# Patient Record
Sex: Female | Born: 1960 | Race: Black or African American | Hispanic: No | Marital: Married | Smoking: Former smoker
Health system: Southern US, Community
[De-identification: ages and names within clinical notes are randomized; demographics above are authoritative.]

## PROBLEM LIST (undated history)

## (undated) DIAGNOSIS — C50919 Malignant neoplasm of unspecified site of unspecified female breast: Secondary | ICD-10-CM

## (undated) DIAGNOSIS — Z8051 Family history of malignant neoplasm of kidney: Secondary | ICD-10-CM

## (undated) DIAGNOSIS — Z800 Family history of malignant neoplasm of digestive organs: Secondary | ICD-10-CM

## (undated) DIAGNOSIS — T8859XA Other complications of anesthesia, initial encounter: Secondary | ICD-10-CM

## (undated) DIAGNOSIS — E785 Hyperlipidemia, unspecified: Secondary | ICD-10-CM

## (undated) DIAGNOSIS — I10 Essential (primary) hypertension: Secondary | ICD-10-CM

## (undated) DIAGNOSIS — Z22322 Carrier or suspected carrier of Methicillin resistant Staphylococcus aureus: Secondary | ICD-10-CM

## (undated) DIAGNOSIS — E119 Type 2 diabetes mellitus without complications: Secondary | ICD-10-CM

## (undated) DIAGNOSIS — F32A Depression, unspecified: Secondary | ICD-10-CM

## (undated) DIAGNOSIS — R7303 Prediabetes: Secondary | ICD-10-CM

## (undated) DIAGNOSIS — Z803 Family history of malignant neoplasm of breast: Secondary | ICD-10-CM

## (undated) DIAGNOSIS — L03116 Cellulitis of left lower limb: Secondary | ICD-10-CM

## (undated) DIAGNOSIS — E039 Hypothyroidism, unspecified: Secondary | ICD-10-CM

## (undated) DIAGNOSIS — Z8042 Family history of malignant neoplasm of prostate: Secondary | ICD-10-CM

## (undated) HISTORY — DX: Family history of malignant neoplasm of digestive organs: Z80.0

## (undated) HISTORY — PX: BREAST SURGERY: SHX581

## (undated) HISTORY — DX: Malignant neoplasm of unspecified site of unspecified female breast: C50.919

## (undated) HISTORY — DX: Hyperlipidemia, unspecified: E78.5

## (undated) HISTORY — DX: Hypothyroidism, unspecified: E03.9

## (undated) HISTORY — DX: Cellulitis of left lower limb: L03.116

## (undated) HISTORY — DX: Family history of malignant neoplasm of kidney: Z80.51

## (undated) HISTORY — DX: Family history of malignant neoplasm of prostate: Z80.42

## (undated) HISTORY — DX: Family history of malignant neoplasm of breast: Z80.3

## (undated) HISTORY — PX: OTHER SURGICAL HISTORY: SHX169

## (undated) HISTORY — DX: Carrier or suspected carrier of methicillin resistant Staphylococcus aureus: Z22.322

---

## 2001-11-03 ENCOUNTER — Other Ambulatory Visit: Admission: RE | Admit: 2001-11-03 | Discharge: 2001-11-03 | Payer: Self-pay | Admitting: Family Medicine

## 2002-05-17 ENCOUNTER — Ambulatory Visit (HOSPITAL_COMMUNITY): Admission: RE | Admit: 2002-05-17 | Discharge: 2002-05-17 | Payer: Self-pay | Admitting: Family Medicine

## 2002-05-17 ENCOUNTER — Encounter: Payer: Self-pay | Admitting: Family Medicine

## 2002-05-27 ENCOUNTER — Ambulatory Visit (HOSPITAL_COMMUNITY): Admission: RE | Admit: 2002-05-27 | Discharge: 2002-05-27 | Payer: Self-pay | Admitting: Family Medicine

## 2002-05-27 ENCOUNTER — Encounter: Payer: Self-pay | Admitting: Family Medicine

## 2002-05-31 ENCOUNTER — Encounter: Admission: RE | Admit: 2002-05-31 | Discharge: 2002-05-31 | Payer: Self-pay | Admitting: Family Medicine

## 2002-05-31 ENCOUNTER — Encounter (INDEPENDENT_AMBULATORY_CARE_PROVIDER_SITE_OTHER): Payer: Self-pay | Admitting: *Deleted

## 2002-05-31 ENCOUNTER — Encounter: Payer: Self-pay | Admitting: Family Medicine

## 2002-06-11 ENCOUNTER — Inpatient Hospital Stay (HOSPITAL_COMMUNITY): Admission: RE | Admit: 2002-06-11 | Discharge: 2002-06-14 | Payer: Self-pay | Admitting: General Surgery

## 2002-06-30 ENCOUNTER — Encounter: Admission: RE | Admit: 2002-06-30 | Discharge: 2002-06-30 | Payer: Self-pay | Admitting: Oncology

## 2002-11-04 ENCOUNTER — Encounter: Payer: Self-pay | Admitting: Family Medicine

## 2002-11-04 ENCOUNTER — Ambulatory Visit (HOSPITAL_COMMUNITY): Admission: RE | Admit: 2002-11-04 | Discharge: 2002-11-04 | Payer: Self-pay | Admitting: Family Medicine

## 2003-06-24 ENCOUNTER — Encounter: Payer: Self-pay | Admitting: Family Medicine

## 2003-06-24 ENCOUNTER — Ambulatory Visit (HOSPITAL_COMMUNITY): Admission: RE | Admit: 2003-06-24 | Discharge: 2003-06-24 | Payer: Self-pay | Admitting: Family Medicine

## 2003-07-04 ENCOUNTER — Encounter (HOSPITAL_COMMUNITY): Admission: RE | Admit: 2003-07-04 | Discharge: 2003-07-07 | Payer: Self-pay | Admitting: Oncology

## 2003-07-04 ENCOUNTER — Encounter: Admission: RE | Admit: 2003-07-04 | Discharge: 2003-07-04 | Payer: Self-pay | Admitting: Oncology

## 2004-06-26 ENCOUNTER — Ambulatory Visit (HOSPITAL_COMMUNITY): Admission: RE | Admit: 2004-06-26 | Discharge: 2004-06-26 | Payer: Self-pay | Admitting: Family Medicine

## 2004-07-04 ENCOUNTER — Encounter (HOSPITAL_COMMUNITY): Admission: RE | Admit: 2004-07-04 | Discharge: 2004-08-03 | Payer: Self-pay | Admitting: Oncology

## 2004-07-04 ENCOUNTER — Encounter: Admission: RE | Admit: 2004-07-04 | Discharge: 2004-07-04 | Payer: Self-pay | Admitting: Oncology

## 2004-11-15 ENCOUNTER — Ambulatory Visit: Payer: Self-pay | Admitting: Family Medicine

## 2005-02-08 ENCOUNTER — Ambulatory Visit: Payer: Self-pay | Admitting: Family Medicine

## 2005-07-03 ENCOUNTER — Ambulatory Visit (HOSPITAL_COMMUNITY): Admission: RE | Admit: 2005-07-03 | Discharge: 2005-07-03 | Payer: Self-pay | Admitting: Family Medicine

## 2005-07-03 ENCOUNTER — Ambulatory Visit (HOSPITAL_COMMUNITY): Payer: Self-pay | Admitting: Oncology

## 2005-07-03 ENCOUNTER — Encounter (HOSPITAL_COMMUNITY): Admission: RE | Admit: 2005-07-03 | Discharge: 2005-07-06 | Payer: Self-pay | Admitting: Oncology

## 2005-07-03 ENCOUNTER — Encounter: Admission: RE | Admit: 2005-07-03 | Discharge: 2005-07-06 | Payer: Self-pay | Admitting: Oncology

## 2005-11-18 ENCOUNTER — Ambulatory Visit: Payer: Self-pay | Admitting: Family Medicine

## 2005-12-05 ENCOUNTER — Ambulatory Visit: Payer: Self-pay | Admitting: Family Medicine

## 2005-12-14 ENCOUNTER — Encounter (INDEPENDENT_AMBULATORY_CARE_PROVIDER_SITE_OTHER): Payer: Self-pay | Admitting: *Deleted

## 2006-01-07 ENCOUNTER — Ambulatory Visit: Payer: Self-pay | Admitting: Family Medicine

## 2006-05-13 ENCOUNTER — Ambulatory Visit: Payer: Self-pay | Admitting: Family Medicine

## 2006-06-17 ENCOUNTER — Ambulatory Visit: Payer: Self-pay | Admitting: Family Medicine

## 2006-07-02 ENCOUNTER — Encounter (HOSPITAL_COMMUNITY): Admission: RE | Admit: 2006-07-02 | Discharge: 2006-07-05 | Payer: Self-pay | Admitting: Oncology

## 2006-07-02 ENCOUNTER — Ambulatory Visit (HOSPITAL_COMMUNITY): Payer: Self-pay | Admitting: Oncology

## 2006-07-02 ENCOUNTER — Encounter: Admission: RE | Admit: 2006-07-02 | Discharge: 2006-07-05 | Payer: Self-pay | Admitting: Oncology

## 2006-07-07 ENCOUNTER — Encounter: Admission: RE | Admit: 2006-07-07 | Discharge: 2006-07-07 | Payer: Self-pay | Admitting: Oncology

## 2006-07-07 ENCOUNTER — Encounter (HOSPITAL_COMMUNITY): Admission: RE | Admit: 2006-07-07 | Discharge: 2006-08-06 | Payer: Self-pay | Admitting: Oncology

## 2006-10-14 ENCOUNTER — Ambulatory Visit: Payer: Self-pay | Admitting: Family Medicine

## 2006-10-15 ENCOUNTER — Encounter: Payer: Self-pay | Admitting: Family Medicine

## 2006-12-15 ENCOUNTER — Encounter: Payer: Self-pay | Admitting: Family Medicine

## 2006-12-15 LAB — CONVERTED CEMR LAB
ALT: <8 units/L (ref 0–35)
AST: 11 units/L (ref 0–37)
BUN: 14 mg/dL (ref 6–23)
Bilirubin, Direct: <0.1 mg/dL (ref 0.0–0.3)
CO2: 22 meq/L (ref 19–32)
Calcium: 9 mg/dL (ref 8.4–10.5)
Cholesterol: 220 mg/dL — ABNORMAL HIGH (ref 0–200)
Creatinine, Ser: 0.77 mg/dL (ref 0.40–1.20)
Eosinophils Absolute: 0 10*3/uL (ref 0.0–0.7)
Eosinophils Relative: 1 % (ref 0–5)
Glucose, Bld: 102 mg/dL — ABNORMAL HIGH (ref 70–99)
HCT: 35.5 % — ABNORMAL LOW (ref 36.0–46.0)
Hemoglobin: 11.6 g/dL — ABNORMAL LOW (ref 12.0–15.0)
Lymphs Abs: 2 10*3/uL (ref 0.7–3.3)
MCV: 93.2 fL (ref 78.0–100.0)
Monocytes Absolute: 0.3 10*3/uL (ref 0.2–0.7)
Monocytes Relative: 6 % (ref 3–11)
Platelets: 302 10*3/uL (ref 150–400)
RBC: 3.81 M/uL — ABNORMAL LOW (ref 3.87–5.11)
Total Bilirubin: 0.3 mg/dL (ref 0.3–1.2)
WBC: 4.6 10*3/uL (ref 4.0–10.5)

## 2007-01-14 ENCOUNTER — Encounter (INDEPENDENT_AMBULATORY_CARE_PROVIDER_SITE_OTHER): Payer: Self-pay | Admitting: *Deleted

## 2007-01-14 ENCOUNTER — Encounter: Payer: Self-pay | Admitting: Family Medicine

## 2007-01-14 ENCOUNTER — Ambulatory Visit: Payer: Self-pay | Admitting: Family Medicine

## 2007-01-14 ENCOUNTER — Other Ambulatory Visit: Admission: RE | Admit: 2007-01-14 | Discharge: 2007-01-14 | Payer: Self-pay | Admitting: Family Medicine

## 2007-01-15 ENCOUNTER — Encounter: Payer: Self-pay | Admitting: Family Medicine

## 2007-01-15 LAB — CONVERTED CEMR LAB
Candida species: NEGATIVE
Chlamydia, DNA Probe: NEGATIVE
GC Probe Amp, Genital: NEGATIVE
Gardnerella vaginalis: POSITIVE — AB

## 2007-05-18 ENCOUNTER — Encounter: Payer: Self-pay | Admitting: Family Medicine

## 2007-05-18 LAB — CONVERTED CEMR LAB
ALT: 8 units/L (ref 0–35)
Albumin: 4.3 g/dL (ref 3.5–5.2)
Alkaline Phosphatase: 55 units/L (ref 39–117)
BUN: 19 mg/dL (ref 6–23)
Chloride: 106 meq/L (ref 96–112)
Cholesterol: 200 mg/dL (ref 0–200)
Creatinine, Ser: 0.8 mg/dL (ref 0.40–1.20)
HDL: 59 mg/dL (ref >39)
Indirect Bilirubin: 0.3 mg/dL (ref 0.0–0.9)
LDL Cholesterol: 132 mg/dL — ABNORMAL HIGH (ref 0–99)
Potassium: 4.5 meq/L (ref 3.5–5.3)
Total Protein: 7.4 g/dL (ref 6.0–8.3)
Triglycerides: 43 mg/dL (ref <150)

## 2007-05-25 ENCOUNTER — Ambulatory Visit: Payer: Self-pay | Admitting: Family Medicine

## 2007-07-01 ENCOUNTER — Ambulatory Visit (HOSPITAL_COMMUNITY): Payer: Self-pay | Admitting: Oncology

## 2007-07-08 ENCOUNTER — Ambulatory Visit: Payer: Self-pay | Admitting: Oncology

## 2007-07-13 ENCOUNTER — Ambulatory Visit (HOSPITAL_COMMUNITY): Admission: RE | Admit: 2007-07-13 | Discharge: 2007-07-13 | Payer: Self-pay | Admitting: Family Medicine

## 2007-09-15 ENCOUNTER — Emergency Department (HOSPITAL_COMMUNITY): Admission: EM | Admit: 2007-09-15 | Discharge: 2007-09-15 | Payer: Self-pay | Admitting: Emergency Medicine

## 2007-09-17 ENCOUNTER — Ambulatory Visit (HOSPITAL_COMMUNITY): Admission: RE | Admit: 2007-09-17 | Discharge: 2007-09-17 | Payer: Self-pay | Admitting: Family Medicine

## 2007-09-17 ENCOUNTER — Ambulatory Visit: Payer: Self-pay | Admitting: Family Medicine

## 2007-09-18 ENCOUNTER — Encounter: Payer: Self-pay | Admitting: Family Medicine

## 2007-09-18 LAB — CONVERTED CEMR LAB
Alkaline Phosphatase: 64 units/L (ref 39–117)
Bilirubin, Direct: 0.1 mg/dL (ref 0.0–0.3)
Indirect Bilirubin: 0.3 mg/dL (ref 0.0–0.9)
LDL Cholesterol: 134 mg/dL — ABNORMAL HIGH (ref 0–99)
Total Bilirubin: 0.4 mg/dL (ref 0.3–1.2)
Triglycerides: 67 mg/dL (ref <150)
VLDL: 13 mg/dL (ref 0–40)

## 2007-10-05 ENCOUNTER — Ambulatory Visit (HOSPITAL_COMMUNITY): Admission: RE | Admit: 2007-10-05 | Discharge: 2007-10-05 | Payer: Self-pay | Admitting: Family Medicine

## 2007-11-25 ENCOUNTER — Ambulatory Visit: Payer: Self-pay | Admitting: Family Medicine

## 2008-01-11 ENCOUNTER — Encounter: Payer: Self-pay | Admitting: Family Medicine

## 2008-01-11 LAB — CONVERTED CEMR LAB
BUN: 17 mg/dL (ref 6–23)
Basophils Relative: 0 % (ref 0–1)
Cholesterol: 211 mg/dL — ABNORMAL HIGH (ref 0–200)
Creatinine, Ser: 0.72 mg/dL (ref 0.40–1.20)
Eosinophils Absolute: 0.1 10*3/uL (ref 0.0–0.7)
Eosinophils Relative: 1 % (ref 0–5)
Glucose, Bld: 96 mg/dL (ref 70–99)
HCT: 35.5 % — ABNORMAL LOW (ref 36.0–46.0)
Hemoglobin: 11.7 g/dL — ABNORMAL LOW (ref 12.0–15.0)
LDL Cholesterol: 147 mg/dL — ABNORMAL HIGH (ref 0–99)
Lymphs Abs: 2.4 10*3/uL (ref 0.7–4.0)
MCHC: 33 g/dL (ref 30.0–36.0)
MCV: 92.7 fL (ref 78.0–100.0)
Monocytes Absolute: 0.4 10*3/uL (ref 0.1–1.0)
Monocytes Relative: 8 % (ref 3–12)
Neutrophils Relative %: 45 % (ref 43–77)
Potassium: 4.4 meq/L (ref 3.5–5.3)
RBC: 3.83 M/uL — ABNORMAL LOW (ref 3.87–5.11)
Triglycerides: 50 mg/dL (ref <150)
VLDL: 10 mg/dL (ref 0–40)
WBC: 5.2 10*3/uL (ref 4.0–10.5)

## 2008-01-28 ENCOUNTER — Encounter: Payer: Self-pay | Admitting: Family Medicine

## 2008-01-28 ENCOUNTER — Encounter (INDEPENDENT_AMBULATORY_CARE_PROVIDER_SITE_OTHER): Payer: Self-pay | Admitting: *Deleted

## 2008-01-28 ENCOUNTER — Ambulatory Visit: Payer: Self-pay | Admitting: Family Medicine

## 2008-01-28 ENCOUNTER — Other Ambulatory Visit: Admission: RE | Admit: 2008-01-28 | Discharge: 2008-01-28 | Payer: Self-pay | Admitting: Family Medicine

## 2008-01-29 ENCOUNTER — Encounter: Payer: Self-pay | Admitting: Family Medicine

## 2008-01-29 LAB — CONVERTED CEMR LAB
Candida species: NEGATIVE
Chlamydia, DNA Probe: NEGATIVE
GC Probe Amp, Genital: NEGATIVE
Gardnerella vaginalis: POSITIVE — AB

## 2008-02-04 ENCOUNTER — Encounter (INDEPENDENT_AMBULATORY_CARE_PROVIDER_SITE_OTHER): Payer: Self-pay | Admitting: *Deleted

## 2008-02-04 DIAGNOSIS — C50919 Malignant neoplasm of unspecified site of unspecified female breast: Secondary | ICD-10-CM | POA: Insufficient documentation

## 2008-02-04 DIAGNOSIS — E782 Mixed hyperlipidemia: Secondary | ICD-10-CM | POA: Insufficient documentation

## 2008-02-04 DIAGNOSIS — E039 Hypothyroidism, unspecified: Secondary | ICD-10-CM | POA: Insufficient documentation

## 2008-02-04 DIAGNOSIS — E785 Hyperlipidemia, unspecified: Secondary | ICD-10-CM | POA: Insufficient documentation

## 2008-06-30 ENCOUNTER — Encounter: Payer: Self-pay | Admitting: Family Medicine

## 2008-07-04 ENCOUNTER — Ambulatory Visit (HOSPITAL_COMMUNITY): Payer: Self-pay | Admitting: Oncology

## 2008-07-04 ENCOUNTER — Encounter: Payer: Self-pay | Admitting: Family Medicine

## 2008-07-04 ENCOUNTER — Telehealth: Payer: Self-pay | Admitting: Family Medicine

## 2008-07-08 ENCOUNTER — Encounter: Payer: Self-pay | Admitting: Family Medicine

## 2008-07-11 LAB — CONVERTED CEMR LAB
ALT: 22 units/L (ref 0–35)
Cholesterol: 139 mg/dL (ref 0–200)
LDL Cholesterol: 69 mg/dL (ref 0–99)
TSH: 0.18 microintl units/mL — ABNORMAL LOW (ref 0.350–4.50)
Total CHOL/HDL Ratio: 2.4
Total Protein: 7.8 g/dL (ref 6.0–8.3)
Triglycerides: 62 mg/dL (ref <150)
VLDL: 12 mg/dL (ref 0–40)

## 2008-07-13 ENCOUNTER — Ambulatory Visit (HOSPITAL_COMMUNITY): Admission: RE | Admit: 2008-07-13 | Discharge: 2008-07-13 | Payer: Self-pay | Admitting: Family Medicine

## 2008-07-18 ENCOUNTER — Ambulatory Visit: Payer: Self-pay | Admitting: Family Medicine

## 2008-07-20 DIAGNOSIS — E669 Obesity, unspecified: Secondary | ICD-10-CM | POA: Insufficient documentation

## 2008-08-02 ENCOUNTER — Encounter: Payer: Self-pay | Admitting: Family Medicine

## 2008-08-18 ENCOUNTER — Ambulatory Visit: Payer: Self-pay | Admitting: Family Medicine

## 2008-08-20 ENCOUNTER — Encounter: Payer: Self-pay | Admitting: Family Medicine

## 2008-08-24 LAB — CONVERTED CEMR LAB
Chlamydia, DNA Probe: NEGATIVE
GC Probe Amp, Genital: NEGATIVE

## 2008-09-06 ENCOUNTER — Encounter: Payer: Self-pay | Admitting: Family Medicine

## 2008-09-07 ENCOUNTER — Encounter: Payer: Self-pay | Admitting: Family Medicine

## 2008-10-24 ENCOUNTER — Ambulatory Visit: Payer: Self-pay | Admitting: Family Medicine

## 2008-10-25 ENCOUNTER — Encounter: Payer: Self-pay | Admitting: Family Medicine

## 2008-10-25 LAB — CONVERTED CEMR LAB
Candida species: NEGATIVE
GC Probe Amp, Genital: NEGATIVE
Gardnerella vaginalis: POSITIVE — AB
Trichomonal Vaginitis: NEGATIVE

## 2008-12-22 ENCOUNTER — Telehealth: Payer: Self-pay | Admitting: Family Medicine

## 2008-12-22 ENCOUNTER — Ambulatory Visit: Payer: Self-pay | Admitting: Family Medicine

## 2008-12-22 DIAGNOSIS — R5383 Other fatigue: Secondary | ICD-10-CM

## 2008-12-22 DIAGNOSIS — R5381 Other malaise: Secondary | ICD-10-CM

## 2008-12-22 LAB — CONVERTED CEMR LAB: Hemoglobin: 10.8 g/dL

## 2009-01-04 ENCOUNTER — Telehealth: Payer: Self-pay | Admitting: Family Medicine

## 2009-01-11 ENCOUNTER — Telehealth: Payer: Self-pay | Admitting: Family Medicine

## 2009-01-13 ENCOUNTER — Ambulatory Visit (HOSPITAL_COMMUNITY): Admission: RE | Admit: 2009-01-13 | Discharge: 2009-01-13 | Payer: Self-pay | Admitting: Family Medicine

## 2009-01-13 ENCOUNTER — Encounter: Payer: Self-pay | Admitting: Family Medicine

## 2009-01-27 ENCOUNTER — Other Ambulatory Visit: Admission: RE | Admit: 2009-01-27 | Discharge: 2009-01-27 | Payer: Self-pay | Admitting: Obstetrics and Gynecology

## 2009-01-30 ENCOUNTER — Encounter: Payer: Self-pay | Admitting: Family Medicine

## 2009-02-17 ENCOUNTER — Encounter: Payer: Self-pay | Admitting: Family Medicine

## 2009-02-17 DIAGNOSIS — D509 Iron deficiency anemia, unspecified: Secondary | ICD-10-CM | POA: Insufficient documentation

## 2009-02-17 DIAGNOSIS — D649 Anemia, unspecified: Secondary | ICD-10-CM | POA: Insufficient documentation

## 2009-03-20 ENCOUNTER — Encounter: Payer: Self-pay | Admitting: Family Medicine

## 2009-03-22 LAB — CONVERTED CEMR LAB
Albumin: 3.9 g/dL (ref 3.5–5.2)
Alkaline Phosphatase: 57 units/L (ref 39–117)
Bilirubin, Direct: 0.1 mg/dL (ref 0.0–0.3)
Indirect Bilirubin: 0.3 mg/dL (ref 0.0–0.9)
LDL Cholesterol: 62 mg/dL (ref 0–99)
Total Bilirubin: 0.4 mg/dL (ref 0.3–1.2)

## 2009-06-14 ENCOUNTER — Encounter (INDEPENDENT_AMBULATORY_CARE_PROVIDER_SITE_OTHER): Payer: Self-pay

## 2009-06-14 ENCOUNTER — Ambulatory Visit: Payer: Self-pay | Admitting: Family Medicine

## 2009-06-14 LAB — CONVERTED CEMR LAB
BUN: 9 mg/dL (ref 6–23)
CO2: 29 meq/L (ref 19–32)
Chloride: 103 meq/L (ref 96–112)
Glucose, Bld: 104 mg/dL — ABNORMAL HIGH (ref 70–99)
Potassium: 3.9 meq/L (ref 3.5–5.3)

## 2009-07-17 ENCOUNTER — Ambulatory Visit (HOSPITAL_COMMUNITY): Admission: RE | Admit: 2009-07-17 | Discharge: 2009-07-17 | Payer: Self-pay | Admitting: Family Medicine

## 2009-07-28 ENCOUNTER — Ambulatory Visit (HOSPITAL_COMMUNITY): Payer: Self-pay | Admitting: Oncology

## 2009-08-01 ENCOUNTER — Encounter: Payer: Self-pay | Admitting: Family Medicine

## 2009-10-10 ENCOUNTER — Ambulatory Visit: Payer: Self-pay | Admitting: Family Medicine

## 2009-10-12 LAB — CONVERTED CEMR LAB: Retic Ct Pct: 1.2 % (ref 0.4–3.1)

## 2009-10-31 ENCOUNTER — Telehealth (INDEPENDENT_AMBULATORY_CARE_PROVIDER_SITE_OTHER): Payer: Self-pay

## 2009-10-31 ENCOUNTER — Encounter: Payer: Self-pay | Admitting: Gastroenterology

## 2009-11-06 ENCOUNTER — Ambulatory Visit: Payer: Self-pay | Admitting: Gastroenterology

## 2009-11-06 ENCOUNTER — Ambulatory Visit (HOSPITAL_COMMUNITY): Admission: RE | Admit: 2009-11-06 | Discharge: 2009-11-06 | Payer: Self-pay | Admitting: Gastroenterology

## 2009-11-08 ENCOUNTER — Encounter (INDEPENDENT_AMBULATORY_CARE_PROVIDER_SITE_OTHER): Payer: Self-pay

## 2010-01-04 ENCOUNTER — Ambulatory Visit: Payer: Self-pay | Admitting: Family Medicine

## 2010-02-20 ENCOUNTER — Encounter: Payer: Self-pay | Admitting: Family Medicine

## 2010-02-28 ENCOUNTER — Encounter: Payer: Self-pay | Admitting: Family Medicine

## 2010-02-28 LAB — CONVERTED CEMR LAB: Retic Ct Pct: 1.3 % (ref 0.4–3.1)

## 2010-03-01 ENCOUNTER — Other Ambulatory Visit: Admission: RE | Admit: 2010-03-01 | Discharge: 2010-03-01 | Payer: Self-pay | Admitting: Family Medicine

## 2010-03-01 ENCOUNTER — Ambulatory Visit: Payer: Self-pay | Admitting: Family Medicine

## 2010-03-02 ENCOUNTER — Encounter: Payer: Self-pay | Admitting: Family Medicine

## 2010-03-06 LAB — CONVERTED CEMR LAB: Vit D, 25-Hydroxy: 23 ng/mL — ABNORMAL LOW (ref 30–89)

## 2010-03-12 ENCOUNTER — Encounter: Payer: Self-pay | Admitting: Family Medicine

## 2010-03-12 LAB — CONVERTED CEMR LAB: Pap Smear: NEGATIVE

## 2010-03-22 ENCOUNTER — Ambulatory Visit: Payer: Self-pay | Admitting: Family Medicine

## 2010-03-23 ENCOUNTER — Encounter: Payer: Self-pay | Admitting: Physician Assistant

## 2010-03-23 LAB — CONVERTED CEMR LAB: GC Probe Amp, Genital: NEGATIVE

## 2010-03-24 ENCOUNTER — Encounter: Payer: Self-pay | Admitting: Physician Assistant

## 2010-03-30 ENCOUNTER — Telehealth: Payer: Self-pay | Admitting: Physician Assistant

## 2010-04-25 ENCOUNTER — Encounter (INDEPENDENT_AMBULATORY_CARE_PROVIDER_SITE_OTHER): Payer: Self-pay

## 2010-04-25 ENCOUNTER — Ambulatory Visit: Payer: Self-pay | Admitting: Family Medicine

## 2010-04-25 ENCOUNTER — Ambulatory Visit (HOSPITAL_COMMUNITY): Admission: RE | Admit: 2010-04-25 | Discharge: 2010-04-25 | Payer: Self-pay | Admitting: Family Medicine

## 2010-04-25 LAB — CONVERTED CEMR LAB
Basophils Absolute: 0 10*3/uL (ref 0.0–0.1)
Eosinophils Relative: 1 % (ref 0–5)
HCT: 38.6 % (ref 36.0–46.0)
Hemoglobin: 11.9 g/dL — ABNORMAL LOW (ref 12.0–15.0)
Lymphocytes Relative: 23 % (ref 12–46)
MCHC: 30.8 g/dL (ref 30.0–36.0)
MCV: 95.3 fL (ref 78.0–100.0)
Monocytes Absolute: 0.8 10*3/uL (ref 0.1–1.0)
RDW: 14.4 % (ref 11.5–15.5)

## 2010-07-23 ENCOUNTER — Ambulatory Visit (HOSPITAL_COMMUNITY): Admission: RE | Admit: 2010-07-23 | Discharge: 2010-07-23 | Payer: Self-pay | Admitting: Family Medicine

## 2010-07-26 ENCOUNTER — Ambulatory Visit: Payer: Self-pay | Admitting: Family Medicine

## 2010-07-31 ENCOUNTER — Encounter: Payer: Self-pay | Admitting: Family Medicine

## 2010-08-03 LAB — CONVERTED CEMR LAB
AST: 14 units/L (ref 0–37)
Bilirubin, Direct: 0.1 mg/dL (ref 0.0–0.3)
TSH: 0.349 microintl units/mL — ABNORMAL LOW (ref 0.350–4.500)
Total Bilirubin: 0.3 mg/dL (ref 0.3–1.2)
Total CHOL/HDL Ratio: 4.3

## 2010-08-04 DIAGNOSIS — E559 Vitamin D deficiency, unspecified: Secondary | ICD-10-CM | POA: Insufficient documentation

## 2010-08-09 ENCOUNTER — Emergency Department (HOSPITAL_COMMUNITY): Admission: EM | Admit: 2010-08-09 | Discharge: 2010-08-09 | Payer: Self-pay | Admitting: Emergency Medicine

## 2010-08-09 ENCOUNTER — Ambulatory Visit: Payer: Self-pay | Admitting: Family Medicine

## 2010-08-09 DIAGNOSIS — R079 Chest pain, unspecified: Secondary | ICD-10-CM | POA: Insufficient documentation

## 2010-08-17 ENCOUNTER — Encounter: Payer: Self-pay | Admitting: Family Medicine

## 2010-08-17 ENCOUNTER — Ambulatory Visit (HOSPITAL_COMMUNITY): Payer: Self-pay | Admitting: Oncology

## 2010-08-17 ENCOUNTER — Encounter (HOSPITAL_COMMUNITY)
Admission: RE | Admit: 2010-08-17 | Discharge: 2010-09-16 | Payer: Self-pay | Source: Home / Self Care | Attending: Oncology | Admitting: Oncology

## 2010-08-21 ENCOUNTER — Encounter: Payer: Self-pay | Admitting: Family Medicine

## 2010-11-08 NOTE — Letter (Signed)
Summary: Internal Other Domingo Dimes  Internal Other Domingo Dimes   Imported By: Cloria Spring LPN 16/07/9603 54:09:81  _____________________________________________________________________  External Attachment:    Type:   Image     Comment:   External Document

## 2010-11-08 NOTE — Letter (Signed)
Summary: Letter  Letter   Imported By: Lind Guest 03/12/2010 13:00:45  _____________________________________________________________________  External Attachment:    Type:   Image     Comment:   External Document

## 2010-11-08 NOTE — Letter (Signed)
Summary: Patient Notice, Colon Biopsy Results  [facility]  [address]   [place], [place] [address]   Phone: [phone]  Fax: [phone]       November 08, 2009   Deanna Bush [address] [place], [place]  [address] 03/25/1961    Dear Ms. Jennette Kettle,  I am pleased to inform you that the biopsies taken during your recent colonoscopy did not show any evidence of cancer upon pathologic examination.  Additional information/recommendations:  __Please follow a high fiber diet  __You should have a repeat colonoscopy examination  in 10 years.  Please call us if you are having persistent problems or have questions about your condition that have not been fully answered at this time.  Sincerely,    Hendricks Limes LPN  [facility] Ph: [phone]    Fax: [phone]

## 2010-11-08 NOTE — Letter (Signed)
Summary: [facility]  [facility]   Imported By: Lind Guest 08/24/2010 09:58:04  _____________________________________________________________________  External Attachment:    Type:   Image     Comment:   External Document

## 2010-11-08 NOTE — Assessment & Plan Note (Signed)
Summary: discharge- room 2   Vital Signs:  Patient profile:   50 year old female Menstrual status:  irregular Height:      66 inches Weight:      203 pounds BMI:     32.88 O2 Sat:      98 % on Room air Pulse rate:   71 / minute Resp:     16 per minute BP sitting:   124 / 60  (left arm)  Vitals Entered By: Adella Hare LPN (March 22, 2010 3:21 PM) CC: vaginal discharge Is Patient Diabetic? No Pain Assessment Patient in pain? no      Comments did not bring meds to ov   CC:  vaginal discharge.  History of Present Illness: Pt presents today with c/o vag dischg x 2 days.  Is becoming irritated.  No itch.  + odor.  No new partners.  Vag bleeding is irrg.  Is perimenopausal.  Had nl pap & pelvic exam end of May.  No pelvic pain. No dysuria or freq.  No change BMs.   Allergies (verified): 1)  ! Sulfa  Past History:  Past medical history reviewed for relevance to current acute and chronic problems.  Past Medical History: Reviewed history from 02/04/2008 and no changes required. 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 GI:  Denies abdominal pain and change in bowel habits. GU:  Denies dysuria and urinary frequency.  Physical Exam  General:  Well-developed,well-nourished,in no acute distress; alert,appropriate and cooperative throughout examination Head:  Normocephalic and atraumatic without obvious abnormalities. No apparent alopecia or balding. Genitalia:  normal introitus, no external lesions, and vaginal discharge yellowish, frothy.  normal introitus, no external lesions, no vaginal or cervical lesions, no friaility or hemorrhage, normal uterus size and position, no adnexal masses or tenderness, and vaginal discharge.   Skin:  Intact without suspicious lesions or rashes genitalia , groin, and superior medial  thighs. Psych:  Cognition and judgment appear intact. Alert and cooperative with normal attention span and concentration. No apparent delusions, illusions, hallucinations   Impression & Recommendations:  Problem # 1:  VAGINITIS (ICD-616.10) Assessment New  Her updated medication list for this problem includes:    Metronidazole 500 Mg Tabs (Metronidazole) .Marland Kitchen... Take 1 two times a day x 7 days  Orders: T-Wet Prep by Molecular Probe [phone]) T-Chlamydia & GC Probe, Genital ([id number])  Complete Medication List: 1)  Lipitor 80 Mg Tabs (Atorvastatin calcium) .... One tab by mouth at bedtime 2)  Ibu 800 Mg Tabs (Ibuprofen) .... Take 1 tablet by mouth three times a day as needed 3)  Mastectomy Bra  .... X 6 4)  Synthroid 125 Mcg Tabs (Levothyroxine sodium) .... Take 1 tablet by mouth once a day 5)  Zyrtec Hives Relief 10 Mg Tabs (Cetirizine hcl) .... Take 1 tablet by mouth once a day 6)  Vitamin D (ergocalciferol) 50000 Unit Caps (Ergocalciferol) .... One tab once a week 7)  Metronidazole 500 Mg Tabs (Metronidazole) .... Take 1 two times a day x 7 days  Patient Instructions: 1)  Please schedule a follow-up appointment as needed. 2)  I have prescribed an antibiotic for you infection. 3)  I will notify you of your culture results when they are available. Prescriptions: METRONIDAZOLE 500 MG TABS (METRONIDAZOLE) take 1 two times a day x 7 days  #14 x 0   Entered and Authorized by:   Esperanza Sheets PA  Signed by:   Esperanza Sheets PA on 03/22/2010   Method used:   Electronically to        [facility] (retail)       [address]       [place], [place]  [address]       Ph: [phone]       Fax: [phone]   RxID:   [id number]

## 2010-11-08 NOTE — Miscellaneous (Signed)
Clinical Lists Changes  Problems: Assessed SCREENING FOR MALIGNANT NEOPLASM OF THE CERVIX as comment only - Signed Assessed HYPERLIPIDEMIA as improved -  Her updated medication list for this problem includes:    Lipitor 80 Mg Tabs (Atorvastatin calcium) ..... One tab by mouth at bedtimetake half lipitor80 mg 5 days per week  Labs Reviewed: SGOT: 13 (02/28/2010)   SGPT: 11 (02/28/2010)   HDL:62 (02/28/2010), 43 (10/10/2009)  LDL:71 (02/28/2010), 133 (16/07/9603)  Chol:141 (02/28/2010), 194 (10/10/2009)  Trig:42 (02/28/2010), 91 (10/10/2009)  - Signed Assessed HYPOTHYROIDISM as comment only -  Her updated medication list for this problem includes:    Synthroid 125 Mcg Tabs (Levothyroxine sodium) .Marland Kitchen... Take 1 tablet by mouth once a day  Labs Reviewed: TSH: 0.678 (02/28/2010)    Chol: 141 (02/28/2010)   HDL: 62 (02/28/2010)   LDL: 71 (02/28/2010)   TG: 42 (02/28/2010)  - Signed Observations: Added new observation of INSTRUCTIONS: F/u in 4.5 months.  Fasting labs prior to next visit. It is important that you exercise regularly at least 20 minutes 5 times a week. If you develop chest pain, have severe difficulty breathing, or feel very tired , stop exercising immediately and seek medical attention. You need to lose weight. Consider a lower calorie diet and regular exercise.  (03/01/2010 19:44) Added new observation of PSYCH COMM: Cognition and judgment appear intact. Alert and cooperative with normal attention span and concentration. No apparent delusions, illusions, hallucinations (03/01/2010 19:44) Added new observation of INGUIN NODES: No significant adenopathy (03/01/2010 19:44) Added new observation of AXILLARY NOD: No palpable lymphadenopathy (03/01/2010 19:44) Added new observation of CERVIC NODES: No lymphadenopathy noted (03/01/2010 19:44) Added new observation of SKIN SQ INSP: Intact without suspicious lesions or rashes (03/01/2010 19:44) Added new observation of NEURO EXAM: No  cranial nerve deficits noted. Station and gait are normal. Plantar reflexes are down-going bilaterally. DTRs are symmetrical throughout. Sensory, motor and coordinative functions appear intact. (03/01/2010 19:44) Added new observation of EXTREMITIES: No clubbing, cyanosis, edema, or deformity noted with normal full range of motion of all joints.   (03/01/2010 19:44) Added new observation of PEDAL PULSE: R and L carotid,radial,femoral,dorsalis pedis and posterior tibial pulses are full and equal bilaterally (03/01/2010 19:44) Added new observation of MSK EXAM: No deformity or scoliosis noted of thoracic or lumbar spine.   (03/01/2010 19:44) Added new observation of GU EXAM GEN: Normal introitus for age, no external lesions, no vaginal discharge, mucosa pink and moist, no vaginal or cervical lesions, no vaginal atrophy, no friaility or hemorrhage, normal uterus size and position, no adnexal masses or tenderness (03/01/2010 19:44) Added new observation of RECTAL EXAM: No external abnormalities noted. Normal sphincter tone. No rectal masses or tenderness. (03/01/2010 19:44) Added new observation of ABDOMEN EXAM: Bowel sounds positive,abdomen soft and non-tender without masses, organomegaly or hernias noted. (03/01/2010 19:44) Added new observation of HEART EXAM: Normal rate and regular rhythm. S1 and S2 normal without gallop, murmur, click, rub or other extra sounds. (03/01/2010 19:44) Added new observation of LUNG EXAM: Normal respiratory effort, chest expands symmetrically. Lungs are clear to auscultation, no crackles or wheezes. (03/01/2010 19:44) Added new observation of BREAST INSP: No mass, nodules, thickening, tenderness, bulging, retraction, inflamation, nipple discharge or skin changes noted.   (03/01/2010 19:44) Added new observation of PE CHST WALL: No deformities, masses, or tenderness noted. (03/01/2010 19:44) Added new observation of NECK EXAM: No deformities, masses, or tenderness noted.  (03/01/2010 19:44) Added new observation of ORAL EXAM: Oral mucosa and oropharynx without lesions or  exudates.  Teeth in good repair. (03/01/2010 19:44) Added new observation of NOSE EXAM: External nasal examination shows no deformity or inflammation. Nasal mucosa are pink and moist without lesions or exudates. (03/01/2010 19:44) Added new observation of EAR EXAM: External ear exam shows no significant lesions or deformities.  Otoscopic examination reveals clear canals, tympanic membranes are intact bilaterally without bulging, retraction, inflammation or discharge. Hearing is grossly normal bilaterally. (03/01/2010 19:44) Added new observation of EYE EXAM: No corneal or conjunctival inflammation noted. EOMI. Perrla. Funduscopic exam benign, without hemorrhages, exudates or papilledema. Vision grossly normal. (03/01/2010 19:44) Added new observation of HD/FACE INSP: Normocephalic and atraumatic without obvious abnormalities. No apparent alopecia or balding. (03/01/2010 19:44) Added new observation of PEADULT: Deanna Overman MD ~General`Gen appear ~Head`hd/face insp ~Eyes`Eye exam ~Ears`Ear exam ~Nose`Nose exam ~Mouth`Oral exam ~Neck`NECK EXAM ~Chest Wall`PE Chst Wall ~Breasts`Breast Insp ~Lungs`lung exam ~Heart`Heart exam ~Abdomen`Abdomen exam ~Rectal`Rectal exam ~Genitalia`GU exam gen ~Msk`MSK EXAM ~Pulses`pedal pulse ~Extremities`Extremities ~Neurologic`Neuro exam ~Skin`skin sq insp ~Cervical Nodes`cervic nodes ~Axillary Nodes`Axillary nod ~Inguinal Nodes`inguin nodes ~Psych`psych comm (03/01/2010 19:44) Added new observation of GEN APPEAR: Well-developed,well-nourished,in no acute distress; alert,appropriate and cooperative throughout examination (03/01/2010 19:44) Added new observation of ROS ENDO: Denies cold intolerance, excessive hunger, excessive thirst, excessive urination, heat intolerance, polyuria, weight change (03/01/2010 19:44) Added new observation of ROS HEME: Denies abnormal bruising,  bleeding (03/01/2010 19:44) Added new observation of ROS ALLERG: Complains of seasonal allergies (03/01/2010 19:44) Added new observation of ROS EYES: Denies blurring, discharge (03/01/2010 19:44) Added new observation of HPI: Reports  that she has been doing well. Denies recent fever or chills. Denies sinus pressure, nasal congestion , ear pain or sore throat. Denies chest congestion, or cough productive of sputum. Denies chest pain, palpitations, PND, orthopnea or leg swelling. Denies abdominal pain, nausea, vomitting, diarrhea or constipation. Denies change in bowel movements or bloody stool. Denies dysuria , frequency, incontinence or hesitancy. Denies  joint pain, swelling, or reduced mobility. Denies headaches, vertigo, seizures. Denies depression, anxiety or insomnia. Denies  rash, lesions, or itch.    (03/01/2010 19:44)           History of Present Illness: Reports  that she has been doing well. Denies recent fever or chills. Denies sinus pressure, nasal congestion , ear pain or sore throat. Denies chest congestion, or cough productive of sputum. Denies chest pain, palpitations, PND, orthopnea or leg swelling. Denies abdominal pain, nausea, vomitting, diarrhea or constipation. Denies change in bowel movements or bloody stool. Denies dysuria , frequency, incontinence or hesitancy. Denies  joint pain, swelling, or reduced mobility. Denies headaches, vertigo, seizures. Denies depression, anxiety or insomnia. Denies  rash, lesions, or itch.      Review of Systems      See HPI Eyes:  Denies blurring and discharge. Endo:  Denies cold intolerance, excessive hunger, excessive thirst, excessive urination, heat intolerance, polyuria, and weight change. Heme:  Denies abnormal bruising and bleeding. Allergy:  Complains of seasonal allergies.   Physical Exam  General:  Well-developed,well-nourished,in no acute distress; alert,appropriate and cooperative throughout  examination Head:  Normocephalic and atraumatic without obvious abnormalities. No apparent alopecia or balding. Eyes:  No corneal or conjunctival inflammation noted. EOMI. Perrla. Funduscopic exam benign, without hemorrhages, exudates or papilledema. Vision grossly normal. Ears:  External ear exam shows no significant lesions or deformities.  Otoscopic examination reveals clear canals, tympanic membranes are intact bilaterally without bulging, retraction, inflammation or discharge. Hearing is grossly normal bilaterally. Nose:  External nasal examination shows no deformity or inflammation. Nasal  mucosa are pink and moist without lesions or exudates. Mouth:  Oral mucosa and oropharynx without lesions or exudates.  Teeth in good repair. Neck:  No deformities, masses, or tenderness noted. Chest Wall:  No deformities, masses, or tenderness noted. Breasts:  No mass, nodules, thickening, tenderness, bulging, retraction, inflamation, nipple discharge or skin changes noted.   Lungs:  Normal respiratory effort, chest expands symmetrically. Lungs are clear to auscultation, no crackles or wheezes. Heart:  Normal rate and regular rhythm. S1 and S2 normal without gallop, murmur, click, rub or other extra sounds. Abdomen:  Bowel sounds positive,abdomen soft and non-tender without masses, organomegaly or hernias noted. Rectal:  No external abnormalities noted. Normal sphincter tone. No rectal masses or tenderness. Genitalia:  Normal introitus for age, no external lesions, no vaginal discharge, mucosa pink and moist, no vaginal or cervical lesions, no vaginal atrophy, no friaility or hemorrhage, normal uterus size and position, no adnexal masses or tenderness Msk:  No deformity or scoliosis noted of thoracic or lumbar spine.   Pulses:  R and L carotid,radial,femoral,dorsalis pedis and posterior tibial pulses are full and equal bilaterally Extremities:  No clubbing, cyanosis, edema, or deformity noted with normal full  range of motion of all joints.   Neurologic:  No cranial nerve deficits noted. Station and gait are normal. Plantar reflexes are down-going bilaterally. DTRs are symmetrical throughout. Sensory, motor and coordinative functions appear intact. Skin:  Intact without suspicious lesions or rashes Cervical Nodes:  No lymphadenopathy noted Axillary Nodes:  No palpable lymphadenopathy Inguinal Nodes:  No significant adenopathy Psych:  Cognition and judgment appear intact. Alert and cooperative with normal attention span and concentration. No apparent delusions, illusions, hallucinations   Impression & Recommendations:  Problem # 1:  SCREENING FOR MALIGNANT NEOPLASM OF THE CERVIX (ICD-V76.2) Assessment Comment Only  Problem # 2:  HYPERLIPIDEMIA (ICD-272.4) Assessment: Improved  Her updated medication list for this problem includes:    Lipitor 80 Mg Tabs (Atorvastatin calcium) ..... One tab by mouth at bedtimetake half lipitor80 mg 5 days per week  Labs Reviewed: SGOT: 13 (02/28/2010)   SGPT: 11 (02/28/2010)   HDL:62 (02/28/2010), 43 (10/10/2009)  LDL:71 (02/28/2010), 133 (16/07/9603)  Chol:141 (02/28/2010), 194 (10/10/2009)  Trig:42 (02/28/2010), 91 (10/10/2009)  Problem # 3:  HYPOTHYROIDISM (ICD-244.9) Assessment: Comment Only  Her updated medication list for this problem includes:    Synthroid 125 Mcg Tabs (Levothyroxine sodium) .Marland Kitchen... Take 1 tablet by mouth once a day  Labs Reviewed: TSH: 0.678 (02/28/2010)    Chol: 141 (02/28/2010)   HDL: 62 (02/28/2010)   LDL: 71 (02/28/2010)   TG: 42 (02/28/2010)  Complete Medication List: 1)  Lipitor 80 Mg Tabs (Atorvastatin calcium) .... One tab by mouth at bedtime 2)  Ibu 800 Mg Tabs (Ibuprofen) .... Take 1 tablet by mouth three times a day as needed 3)  Mastectomy Bra  .... X 6 4)  Synthroid 125 Mcg Tabs (Levothyroxine sodium) .... Take 1 tablet by mouth once a day 5)  Zyrtec Hives Relief 10 Mg Tabs (Cetirizine hcl) .... Take 1 tablet by mouth  once a day   Patient Instructions: 1)  F/u in 4.5 months. 2)  Fasting labs prior to next visit. 3)  It is important that you exercise regularly at least 20 minutes 5 times a week. If you develop chest pain, have severe difficulty breathing, or feel very tired , stop exercising immediately and seek medical attention. 4)  You need to lose weight. Consider a lower calorie diet and regular exercise.  this visit has already been billed

## 2010-11-08 NOTE — Assessment & Plan Note (Signed)
Summary: FOLLOW UP   Vital Signs:  Patient profile:   50 year old female Menstrual status:  irregular LMP:     10/08/2009 Height:      66 inches Weight:      199 pounds BMI:     32.24 O2 Sat:      96 % Pulse rate:   66 / minute Pulse rhythm:   regular Resp:     16 per minute BP sitting:   114 / 72  Vitals Entered By: Everitt Amber (October 10, 2009 3:37 PM)  Nutrition Counseling: Patient's BMI is greater than 25 and therefore counseled on weight management options. CC: Follow up chronic problems LMP (date): 10/08/2009     Enter LMP: 10/08/2009 Last PAP Result Normal   CC:  Follow up chronic problems.  History of Present Illness: Reports  thatshe has been doing well. Her only concern is her weight, she is steadily gaing weight, and has not been as diligent with exercise as she had intended. Denies recent fever or chills. Denies sinus pressure, nasal congestion , ear pain or sore throat. Denies chest congestion, or cough productive of sputum. Denies chest pain, palpitations, PND, orthopnea or leg swelling. Denies abdominal pain, nausea, vomitting, diarrhea or constipation. Denies change in bowel movements or bloody stool. Denies dysuria , frequency, incontinence or hesitancy. Denies  joint pain, swelling, or reduced mobility. Denies headaches, vertigo, seizures. Denies depression, anxiety or insomnia. Denies  rash, lesions, or itch.     Current Medications (verified): 1)  Allegra-D 24 Hour 180-240 Mg  Tb24 (Fexofenadine-Pseudoephedrine) .... One Tab By Mouth Qd 2)  Lipitor 80 Mg  Tabs (Atorvastatin Calcium) .... One Tab By Mouth At Bedtime 3)  Synthroid 100 Mcg Tabs (Levothyroxine Sodium) .... Take 1 Tablet By Mouth Once A Day 4)  Ibu 800 Mg Tabs (Ibuprofen) .... Take 1 Tablet By Mouth Three Times A Day As Needed 5)  Mastectomy Bra .... X 6  Allergies (verified): 1)  ! Sulfa  Review of Systems      See HPI Eyes:  Denies blurring, discharge, eye pain, and red  eye. GU:  Complains of abnormal vaginal bleeding; improved, no specific intervention by gynae per pt report. Neuro:  Denies headaches, seizures, and visual disturbances. Endo:  Denies cold intolerance, excessive hunger, excessive thirst, excessive urination, heat intolerance, polyuria, and weight change. Heme:  Denies abnormal bruising and bleeding. Allergy:  Denies hives or rash, itching eyes, and seasonal allergies.  Physical Exam  General:  Well-developed,overweight,in no acute distress; alert,appropriate and cooperative throughout examination HEENT: No facial asymmetry,  EOMI, No sinus tenderness, TM's Clear, oropharynx  pink and moist.   Chest: Clear to auscultation bilaterally.  CVS: S1, S2, No murmurs, No S3.   Abd: Soft, Nontender.  MS: Adequate ROM spine, hips, shoulders and knees.  Ext: No edema.   CNS: CN 2-12 intact, power tone and sensation normal throughout.   Skin: Intact, no visible lesions or rashes.  Psych: Good eye contact, normal affect.  Memory intact, not anxious or depressed appearing.    Impression & Recommendations:  Problem # 1:  OBESITY, UNSPECIFIED (ICD-278.00) Assessment Deteriorated  Ht: 66 (10/10/2009)   Wt: 199 (10/10/2009)   BMI: 32.24 (10/10/2009)  Problem # 2:  ANEMIA (ICD-285.9) Assessment: Comment Only  Hgb: 11.2 (06/14/2009)   Hct: 34.1 (06/14/2009)   Platelets: 255 (06/14/2009) RBC: 3.72 (06/14/2009)   RDW: 13.6 (06/14/2009)   WBC: 5.6 (06/14/2009) MCV: 91.7 (06/14/2009)   MCHC: 32.8 (06/14/2009) Retic Ct:  44.6 K/uL (06/14/2009)   Ferritin: 14 (06/14/2009) Iron: 33 (06/14/2009)   TIBC: 285 (06/14/2009)   % Sat: 12 (06/14/2009) B12: 375 (06/14/2009)   Folate: >20.0 ng/mL (06/14/2009)   TSH: 0.816 (06/14/2009)  Problem # 3:  MENORRHAGIA, PERIMENOPAUSAL (ICD-626.8) Assessment: Improved  Problem # 4:  HYPERLIPIDEMIA (ICD-272.4) Assessment: Comment Only  Her updated medication list for this problem includes:    Lipitor 80 Mg Tabs  (Atorvastatin calcium) ..... One tab by mouth at bedtime [pt reports taking med on avg 3 times weekly Labs Reviewed: SGOT: 14 (03/20/2009)   SGPT: 9 (03/20/2009)   HDL:55 (03/20/2009), 58 (07/08/2008)  LDL:62 (03/20/2009), 69 (07/08/2008)  Chol:128 (03/20/2009), 139 (07/08/2008)  Trig:54 (03/20/2009), 62 (07/08/2008)  Orders: T-Lipid Profile ([id number]) T-Hepatic Function ([address]-91478)  Problem # 5:  HYPOTHYROIDISM (ICD-244.9) Assessment: Comment Only  The following medications were removed from the medication list:    Synthroid 100 Mcg Tabs (Levothyroxine sodium) .Marland Kitchen... Take 1 tablet by mouth once a day Her updated medication list for this problem includes:    Synthroid 125 Mcg Tabs (Levothyroxine sodium) .Marland Kitchen... Take 1 tablet by mouth once a day  Orders: T-TSH ([id number])  Labs Reviewed: TSH: 0.816 (06/14/2009)    Chol: 128 (03/20/2009)   HDL: 55 (03/20/2009)   LDL: 62 (03/20/2009)   TG: 54 (03/20/2009)  Complete Medication List: 1)  Allegra-d 24 Hour 180-240 Mg Tb24 (Fexofenadine-pseudoephedrine) .... One tab by mouth qd 2)  Lipitor 80 Mg Tabs (Atorvastatin calcium) .... One tab by mouth at bedtime 3)  Ibu 800 Mg Tabs (Ibuprofen) .... Take 1 tablet by mouth three times a day as needed 4)  Mastectomy Bra  .... X 6 5)  Synthroid 125 Mcg Tabs (Levothyroxine sodium) .... Take 1 tablet by mouth once a day  Other Orders: Gastroenterology Referral (GI)  Patient Instructions: 1)  CPE in  4.5 months. 2)  It is important that you exercise regularly at least 30 minutes 5 times a week. If you develop chest pain, have severe difficulty breathing, or feel very tired , stop exercising immediately and seek medical attention. 3)  You need to lose weight. Consider a lower calorie diet and regular exercise. Goal is 6 to 10 pounds. 4)  You will be referred for a screening colonscopy. 5)  Next breast exam is an MRi 6)  Hepatic Panel prior to visit, ICD-9:   fasting in 4.5 months 7)  Lipid  Panel prior to visit, ICD-9: 8)  TSH prior to visit, ICD-9: Prescriptions: SYNTHROID 125 MCG TABS (LEVOTHYROXINE SODIUM) Take 1 tablet by mouth once a day  #30 x 4   Entered and Authorized by:   [name] MD   Signed by:   [name] MD on 10/12/2009   Method used:   Print[facility] ...       [facility]       [address]       [place], [place]  [address]       Ph: [phone]       Fax: [phone]   RxID:   [id number]

## 2010-11-08 NOTE — Miscellaneous (Signed)
Summary: labs  Clinical Lists Changes  Orders: Added new Test order of T-Basic Metabolic Panel [phone]) - Signed Added new Test order of T-Hepatic Function [phone]) - Signed Added new Test order of T-Lipid Profile [phone]) - Signed Added new Test order of T-TSH ([id number]) - Signed Added new Test order of T-Anemia Panel 3  (2904) - Signed

## 2010-11-08 NOTE — Progress Notes (Signed)
  Phone Note Other Incoming   Summary of Call: Pt needs retreatment for Trichamonas. Husband was seen and treated today. Initial call taken by: Esperanza Sheets PA,  March 30, 2010 9:08 AM    New/Updated Medications: METRONIDAZOLE 500 MG TABS (METRONIDAZOLE) take 1 two times a day x 7days Prescriptions: METRONIDAZOLE 500 MG TABS (METRONIDAZOLE) take 1 two times a day x 7days  #14 x 0   Entered and Authorized by:   Esperanza Sheets PA   Signed by:   Esperanza Sheets PA on 03/30/2010   Method used:   Electronically to        [facility] (retail)       [address]       [place], [place]  [address]       Ph: [phone]       Fax: [phone]   RxID:   [id number]

## 2010-11-08 NOTE — Assessment & Plan Note (Signed)
Summary: OV   Vital Signs:  Patient profile:   50 year old female Menstrual status:  irregular Height:      66 inches Weight:      203.50 pounds BMI:     32.96 O2 Sat:      98 % on Room air Pulse rate:   60 / minute Pulse rhythm:   regular Resp:     16 per minute BP sitting:   124 / 80  (right arm)  Vitals Entered By: Mauricia Area CMA (August 09, 2010 9:13 AM)  Nutrition Counseling: Patient's BMI is greater than 25 and therefore counseled on weight management options.  O2 Flow:  Room air CC: Sharp pain started this morning under shoulder blade, it is now a nagging pain and when taking deep breaths, it radiates down back.   CC:  Sharp pain started this morning under shoulder blade, it is now a nagging pain and when taking deep breaths, and it radiates down back..  History of Present Illness: Acute left posteriorchest pain , rated at a 9, duration,less than 1 minute.radiates around to ant breast, has no dyspnea , lightheadedness, she reports pain with breathing. First episode ,concerned about heart attack possibly. She reports pain with deep breathing.also.Currently the pain is about an 8.   Current Medications (verified): 1)  Mastectomy Bra .... X 6 2)  Synthroid 125 Mcg Tabs (Levothyroxine Sodium) .... Take 1 Tablet By Mouth Once A Day 3)  Vitamin D (Ergocalciferol) 50000 Unit Caps (Ergocalciferol) .... One Tab Once A Week 4)  Lipitor 80 Mg Tabs (Atorvastatin Calcium) .... Take 1 Tab By Mouth At Bedtime  Allergies (verified): 1)  ! Sulfa  Review of Systems      See HPI CV:  Complains of chest pain or discomfort; denies lightheadness, near fainting, palpitations, shortness of breath with exertion, and swelling of feet. Resp:  Complains of chest pain with inspiration. Endo:  Denies cold intolerance, excessive thirst, excessive urination, and polyuria. Heme:  Denies abnormal bruising and bleeding. Allergy:  Complains of seasonal allergies.  Physical Exam  General:   Well-developed,well-nourished,in no acute distress; alert,appropriate and cooperative throughout examination HEENT: No facial asymmetry,  EOMI, No sinus tenderness, TM's Clear, oropharynx  pink and moist.   Chest: Clear to auscultation bilaterally. no chest wall tenderness. CVS: S1, S2, No murmurs, No S3.   Abd: Soft, Nontender.  MS: Adequate ROM spine, hips, shoulders and knees.  Ext: No edema.   CNS: CN 2-12 intact, power tone and sensation normal throughout.   Skin: Intact, no visible lesions or rashes.  Psych: Good eye contact, normal affect.  Memory intact, mildly  anxious  appearing.    Impression & Recommendations:  Problem # 1:  CHEST PAIN UNSPECIFIED (ICD-786.50) Assessment Comment Only  Orders: EKG w/ Interpretation (93000) inverted t waves i ant leads, pt sent to Ed for evl, suspiscion low for ACS,   Problem # 2:  HYPERLIPIDEMIA (ICD-272.4) Assessment: Deteriorated  Her updated medication list for this problem includes:    Lipitor 80 Mg Tabs (Atorvastatin calcium) .Marland Kitchen... Take 1 tab by mouth at bedtime  Labs Reviewed: SGOT: 14 (07/23/2010)   SGPT: 10 (07/23/2010)   HDL:53 (07/23/2010), 62 (02/28/2010)  LDL:160 (07/23/2010), 71 (04/54/0981)  Chol:226 (07/23/2010), 141 (02/28/2010)  Trig:63 (07/23/2010), 42 (02/28/2010)  Complete Medication List: 1)  Mastectomy Bra  .... X 6 2)  Synthroid 125 Mcg Tabs (Levothyroxine sodium) .... Take 1 tablet by mouth once a day 3)  Vitamin D (ergocalciferol) 50000  Unit Caps (Ergocalciferol) .... One tab once a week 4)  Lipitor 80 Mg Tabs (Atorvastatin calcium) .... Take 1 tab by mouth at bedtime  Patient Instructions: 1)  F/U as before. 2)  You need to go to the Ed for further eval,ii have spoken to the Doc 3)      Orders Added: 1)  Est. Patient Level III [[address]] 2)  EKG w/ Interpretation [93000]

## 2010-11-08 NOTE — Letter (Signed)
Summary: Out of Work  [facility]  [address]   [place], [place] [address]   Phone: [phone]  Fax: [phone]    April 25, 2010   Employee:  WINEFRED HILLESHEIM    To Whom It May Concern:   For Medical reasons, please excuse the above named employee from work for the following dates:  Start:   04/25/2010  End:   04/30/2010 To return without restrictions  If you need additional information, please feel free to contact our office.         Sincerely,    Milus Mallick. Lodema Hong, M.D.

## 2010-11-08 NOTE — Letter (Signed)
Summary: mastectony bra  mastectony bra   Imported By: Lind Guest 08/21/2010 07:45:48  _____________________________________________________________________  External Attachment:    Type:   Image     Comment:   External Document

## 2010-11-08 NOTE — Assessment & Plan Note (Signed)
Summary: PHY   Vital Signs:  Patient profile:   50 year old female Menstrual status:  irregular LMP:     02/24/2010 Height:      66 inches Weight:      198 pounds BMI:     32.07 O2 Sat:      96 % Pulse rate:   69 / minute Pulse rhythm:   regular Resp:     16 per minute BP sitting:   120 / 78  (left arm) Cuff size:   large  Vitals Entered By: Everitt Amber LPN (Mar 01, 2010 3:42 PM)  Nutrition Counseling: Patient's BMI is greater than 25 and therefore counseled on weight management options. CC: CPE   Vision Screening:Left eye with correction: 20 / 20 Right eye with correction: 20 / 30 Both eyes with correction: 20 / 20  Color vision testing: normal      Vision Entered By: Everitt Amber LPN (Mar 01, 2010 3:44 PM) LMP (date): 02/24/2010     Enter LMP: 02/24/2010 Last PAP Result Normal   CC:  CPE .  Current Medications (verified): 1)  Lipitor 80 Mg  Tabs (Atorvastatin Calcium) .... One Tab By Mouth At Bedtime 2)  Ibu 800 Mg Tabs (Ibuprofen) .... Take 1 Tablet By Mouth Three Times A Day As Needed 3)  Mastectomy Bra .... X 6 4)  Synthroid 125 Mcg Tabs (Levothyroxine Sodium) .... Take 1 Tablet By Mouth Once A Day 5)  Zyrtec Hives Relief 10 Mg Tabs (Cetirizine Hcl) .... Take 1 Tablet By Mouth Once A Day  Allergies (verified): 1)  ! Sulfa   Complete Medication List: 1)  Lipitor 80 Mg Tabs (Atorvastatin calcium) .... One tab by mouth at bedtime 2)  Ibu 800 Mg Tabs (Ibuprofen) .... Take 1 tablet by mouth three times a day as needed 3)  Mastectomy Bra  .... X 6 4)  Synthroid 125 Mcg Tabs (Levothyroxine sodium) .... Take 1 tablet by mouth once a day 5)  Zyrtec Hives Relief 10 Mg Tabs (Cetirizine hcl) .... Take 1 tablet by mouth once a day  Patient Instructions: 1)  Please schedule a follow-up appointment in 4.5 months. 2)  Hepatic Panel prior to visit, ICD-9: 3)  Lipid Panel prior to visit, ICD-9:  fasting in 4.5 months 4)  TSH prior to visit, ICD-9: 5)  It is important  that you exercise regularly at least 20 minutes 5 times a week. If you develop chest pain, have severe difficulty breathing, or feel very tired , stop exercising immediately and seek medical attention. 6)  You need to lose weight. Consider a lower calorie diet and regular exercise. Pls cut back on the lipitor, take half rtan five days per week  Appended Document: PHY   Appended Document: PHY   Appended Document: PHY

## 2010-11-08 NOTE — Assessment & Plan Note (Signed)
Summary: office visit   Vital Signs:  Patient profile:   50 year old female Menstrual status:  irregular Height:      66 inches Weight:      198 pounds BMI:     32.07 O2 Sat:      98 % Pulse rate:   77 / minute Pulse rhythm:   regular Resp:     16 per minute BP sitting:   122 / 80  (right arm) Cuff size:   large  Vitals Entered By: Everitt Amber LPN (January 04, 2010 11:16 AM)  Nutrition Counseling: Patient's BMI is greater than 25 and therefore counseled on weight management options. CC: Right knee has a red swollen area on it that has a fever and is sore to the touch. Started out as a small area yesterday and this morning it got alot bigger   CC:  Right knee has a red swollen area on it that has a fever and is sore to the touch. Started out as a small area yesterday and this morning it got alot bigger.  History of Present Illness: 2 day h/o swelling and redness of left knee with a pimple.  Irreg periods, driped last month. She otherwise has no complaints and has been doing well. She denies any recent fever or chills, head or chest congestion, dysuria or frequency. She exercises at least 4 days per week , and has been modifying her diet to improve her health   Allergies: 1)  ! Sulfa  Review of Systems      See HPI General:  Denies chills and fever. Eyes:  Denies blurring and discharge. CV:  Denies chest pain or discomfort, palpitations, and swelling of feet. Resp:  Denies cough and sputum productive. GI:  Denies abdominal pain, constipation, diarrhea, nausea, and vomiting. GU:  Denies dysuria and urinary frequency. MS:  Denies joint pain, low back pain, mid back pain, and stiffness. Derm:  See HPI. Psych:  Denies anxiety and depression. Endo:  Denies cold intolerance, excessive hunger, excessive thirst, excessive urination, heat intolerance, polyuria, and weight change. Heme:  Denies abnormal bruising and bleeding. Allergy:  Complains of seasonal allergies.  Physical  Exam  General:  Well-developed,well-nourished,in no acute distress; alert,appropriate and cooperative throughout examination HEENT: No facial asymmetry,  EOMI, No sinus tenderness, TM's Clear, oropharynx  pink and moist.   Chest: Clear to auscultation bilaterally.  CVS: S1, S2, No murmurs, No S3.   Abd: Soft, Nontender.  MS: Adequate ROM spine, hips, shoulders and knees.  Ext: No edema.   CNS: CN 2-12 intact, power tone and sensation normal throughout.   Skin:erythematous area on left distal thigh around the area of the knee with pustular lesion Psych: Good eye contact, normal affect.  Memory intact, not anxious or depressed appearing.    Impression & Recommendations:  Problem # 1:  CELLULITIS (ICD-682.9) Assessment Comment Only  Her updated medication list for this problem includes:    Doxycycline Hyclate 100 Mg Caps (Doxycycline hyclate) .Marland Kitchen... Take 1 capsule by mouth two times a day  Orders: Rocephin  250mg  (Z6109) Admin of Therapeutic Inj  intramuscular or subcutaneous ([address])  Problem # 2:  MENORRHAGIA, PERIMENOPAUSAL (ICD-626.8) Assessment: Improved  Problem # 3:  OBESITY, UNSPECIFIED (ICD-278.00) Assessment: Improved  Ht: 66 (01/04/2010)   Wt: 198 (01/04/2010)   BMI: 32.07 (01/04/2010)  Problem # 4:  HYPOTHYROIDISM (ICD-244.9) Assessment: Comment Only  Her updated medication list for this problem includes:    Synthroid 125 Mcg Tabs (Levothyroxine sodium) .Marland KitchenMarland KitchenMarland KitchenMarland Kitchen  Take 1 tablet by mouth once a day  Labs Reviewed: TSH: 6.270 (10/10/2009)    Chol: 194 (10/10/2009)   HDL: 43 (10/10/2009)   LDL: 133 (10/10/2009)   TG: 91 (10/10/2009)  Complete Medication List: 1)  Allegra-d 24 Hour 180-240 Mg Tb24 (Fexofenadine-pseudoephedrine) .... One tab by mouth qd 2)  Lipitor 80 Mg Tabs (Atorvastatin calcium) .... One tab by mouth at bedtime 3)  Ibu 800 Mg Tabs (Ibuprofen) .... Take 1 tablet by mouth three times a day as needed 4)  Mastectomy Bra  .... X 6 5)  Synthroid 125 Mcg  Tabs (Levothyroxine sodium) .... Take 1 tablet by mouth once a day 6)  Doxycycline Hyclate 100 Mg Caps (Doxycycline hyclate) .... Take 1 capsule by mouth two times a day 7)  Fluconazole 150 Mg Tabs (Fluconazole) .... Take 1 tablet by mouth once a day as needed  Other Orders: T- Hemoglobin A1C ([address])  Patient Instructions: 1)  F/U as before. 2)  You are being treated fior,cellulitis and an abcess on the right knee. 3)  You will get an injection in the office and meds are being sent in also Prescriptions: FLUCONAZOLE 150 MG TABS (FLUCONAZOLE) Take 1 tablet by mouth once a day as needed  #3 x 0   Entered and Authorized by:   [name] MD   Signed by:   [name] MD on 01/04/2010   Method used:   Electronically to        [facility] (retail)       [address]       [place], [place]  [address]       Ph: [phone]       Fax: [phone]   RxID:   [id number] DOXYCYCLINE HYCLATE 100 MG CAPS (DOXYCYCLINE HYCLATE) Take 1 capsule by mouth two times a day  #20 x 0   Entered and Authorized by:   [name] MD   Signed by:   [name] MD on 01/04/2010   Method used:   Electronically to        [facility] (retail)       [address]       [place], [place]  [address]       Ph: [phone]       Fax: [phone]   RxID:   [id number]    Medication Administration  Injection # 1:    Medication: Rocephin  250mg     Diagnosis: CELLULITIS (ICD-682.9)    Route: IM    Site: RUOQ gluteus    Exp Date: 4/13    Lot #: [id number]    Mfr: novaplus    Comments: rocephin 500mg     Patient tolerated injection without complications    Given by: Adella Hare LPN (January 04, 2010 1:15 PM)  Orders Added: 1)  Est. Patient Level IV [[address]355] 2)  T- Hemoglobin A1C [83036-23375] 3)  Rocephin  250mg  [J0696] 4)  Admin of Therapeutic Inj  intramuscular or subcutaneous [[address]20]

## 2010-11-08 NOTE — Assessment & Plan Note (Signed)
Summary: office visit   Vital Signs:  Patient profile:   50 year old female Menstrual status:  irregular Height:      66 inches Weight:      200.75 pounds BMI:     32.52 O2 Sat:      97 % on Room air Pulse rate:   66 / minute Pulse rhythm:   regular Resp:     16 per minute BP sitting:   122 / 70  (left arm)  Vitals Entered By: Adella Hare LPN (July 26, 2010 3:41 PM)  Nutrition Counseling: Patient's BMI is greater than 25 and therefore counseled on weight management options.  O2 Flow:  Room air CC: follow-up visit Is Patient Diabetic? No Pain Assessment Patient in pain? no        CC:  follow-up visit.  History of Present Illness: Reports  thatshe has been  doing well. Denies recent fever or chills. Denies sinus pressure, nasal congestion , ear pain or sore throat. Denies chest congestion, or cough productive of sputum. Denies chest pain, palpitations, PND, orthopnea or leg swelling. Denies abdominal pain, nausea, vomitting, diarrhea or constipation. Denies change in bowel movements or bloody stool. Denies dysuria , frequency, incontinence or hesitancy. Denies  joint pain, swelling, or reduced mobility. Denies headaches, vertigo, seizures. Denies depression, anxiety or insomnia. Denies  rash, lesions, or itch.     Current Medications (verified): 1)  Lipitor 80 Mg  Tabs (Atorvastatin Calcium) .... One Tab By Mouth At Bedtime 2)  Mastectomy Bra .... X 6 3)  Synthroid 125 Mcg Tabs (Levothyroxine Sodium) .... Take 1 Tablet By Mouth Once A Day 4)  Vitamin D (Ergocalciferol) 50000 Unit Caps (Ergocalciferol) .... One Tab Once A Week  Allergies (verified): 1)  ! Sulfa  Review of Systems Eyes:  high pressure of eyes    20 in 2011, currently being evaluated. GU:  Complains of abnormal vaginal bleeding; no menses in 3 months . Endo:  Denies cold intolerance, excessive hunger, excessive thirst, and heat intolerance. Heme:  Denies abnormal bruising and  bleeding. Allergy:  Denies hives or rash and itching eyes.  Physical Exam  General:  Well-developed,well-nourished,in no acute distress; alert,appropriate and cooperative throughout examinationIll appearing. HEENT: No facial asymmetry,  EOMI, fnol  sinus tenderness, TM's Clear, oropharynx  pink and moist.   Chest: clear to ascultation CVS: S1, S2, No murmurs, No S3.   Abd: Soft, Nontender.  MS: Adequate ROM spine, hips, shoulders and knees.  Ext: No edema.   CNS: CN 2-12 intact, power tone and sensation normal throughout.   Skin: Intact, no visible lesions or rashes.  Psych: Good eye contact, normal affect.  Memory intact, not anxious or depressed appearing.    Impression & Recommendations:  Problem # 1:  HYPERLIPIDEMIA (ICD-272.4) Assessment Deteriorated  The following medications were removed from the medication list:    Lipitor 80 Mg Tabs (Atorvastatin calcium) ..... One half tab by mouth at bedtime Her updated medication list for this problem includes:    Lipitor 80 Mg Tabs (Atorvastatin calcium) .Marland Kitchen... Take 1 tab by mouth at bedtime Low fat dietdiscussed and encouraged pt has been  compliant, will add niaspan Orders: T-Lipid Profile [phone]) T-Hepatic Function [phone]) T-Lipid Profile [id number]) T-Hepatic Function [phone])  Labs Reviewed: SGOT: 14 (07/23/2010)   SGPT: 10 (07/23/2010)   HDL:53 (07/23/2010), 62 (02/28/2010)  LDL:160 (07/23/2010), 71 (28/41/3244)  Chol:226 (07/23/2010), 141 (02/28/2010)  Trig:63 (07/23/2010), 42 (02/28/2010)  Problem # 2:  HYPOTHYROIDISM (ICD-244.9) Assessment: Comment Only  Her updated medication list for this problem includes:    Synthroid 125 Mcg Tabs (Levothyroxine sodium) .Marland Kitchen... Take 1 tablet by mouth once a day  Orders: T-TSH ([id number]) T-TSH [phone])  Labs Reviewed: TSH: 0.349 (07/23/2010)    Chol: 226 (07/23/2010)   HDL: 53 (07/23/2010)   LDL: 160 (07/23/2010)   TG: 63 (07/23/2010)  Problem # 3:   UNSPECIFIED VITAMIN D DEFICIENCY (ICD-268.9) Assessment: Comment Only pt on prescription med and this will be followed  Complete Medication List: 1)  Mastectomy Bra  .... X 6 2)  Synthroid 125 Mcg Tabs (Levothyroxine sodium) .... Take 1 tablet by mouth once a day 3)  Vitamin D (ergocalciferol) 50000 Unit Caps (Ergocalciferol) .... One tab once a week 4)  Lipitor 80 Mg Tabs (Atorvastatin calcium) .... Take 1 tab by mouth at bedtime  Other Orders: T-Basic Metabolic Panel [id number]) T-CBC w/Diff [phone])  Patient Instructions: 1)  CPE in early June. 2)  BMP prior to visit, ICD-9: 3)  Hepatic Panel prior to visit, ICD-9:   fasting in June 4)  Lipid Panel prior to visit, ICD-9: 5)  TSH prior to visit, ICD-9: 6)  CBC w/ Diff prior to visit, ICD-9: 7)  DOSE iNc on lipitor to 80mg  once daily. 8)  Pls follow a low fat diet 9)  Hepatic Panel prior to visit, ICD-9: 10)  Lipid Panel prior to visit, ICD-9:   fasting mid February 11)  TSH prior to visit, ICD-9: Prescriptions: NIASPAN 500 MG CR-TABS (NIACIN (ANTIHYPERLIPIDEMIC)) two tablets at bedtime  #60 x 3   Entered and Authorized by:   [name] MD   Signed by:   [name] MD on 08/02/2010   Method used:   Historical   RxID:   [id number] LIPITOR 80 MG TABS (ATORVASTATIN CALCIUM) Take 1 tab by mouth at bedtime  #30 x 5   Entered and Authorized by:   [name] MD   Signed by:   [name] MD on 07/26/2010   Method used:   Printed then faxed to ...       [facility] (retail)       [address]       [place], [place]  [address]       Ph: [phone]       Fax: [phone]   RxID:   [id number]    Orders Added: 1)  Est. Patient Level IV [99214] 2)  T-Basic Metabolic Panel [id number] 3)  T-Lipid Profile [80061-22930] 4)  T-Hepatic Function [80076-22960] 5)  T-CBC w/Diff [27035-00938] 6)  T-TSH [18299-37169] 7)  T-Lipid Profile [80061-22930] 8)   T-Hepatic Function [67893-81017] 9)  T-TSH [51025-85277]

## 2010-11-08 NOTE — Progress Notes (Signed)
Summary: LMOM for pt Rx and instructions mailed 10/27/2009  Phone Note Outgoing Call   Call placed by: Tyler Aas  Call placed to: Patient Summary of Call: [facility] for pt that her RX and instrudtions were mailed on 10/27/2009, to call if any questions. Initial call taken by: Cloria Spring LPN,  October 31, 2009 2:52 PM

## 2010-11-08 NOTE — Assessment & Plan Note (Signed)
Summary: office visit   Vital Signs:  Patient profile:   50 year old female Menstrual status:  irregular Height:      66 inches Weight:      200.25 pounds BMI:     32.44 O2 Sat:      97 % Pulse rate:   83 / minute Pulse rhythm:   regular Resp:     16 per minute BP sitting:   140 / 90  (right arm) Cuff size:   large  Vitals Entered By: Everitt Amber, LPN  Nutrition Counseling: Patient's BMI is greater than 25 and therefore counseled on weight management options. CC: thinks she has a sinus infection, has greenish drainage, and coughing up brownish phlegm, Depression   CC:  thinks she has a sinus infection, has greenish drainage, and coughing up brownish phlegm, and Depression.  History of Present Illness: 1 week h/o increasing head and chest congestion, worse in the past 3 days, fever and chills, tight chest , difficulty breathing, grey/green nasal d/c and sputum. pressure over maxillary and ethmoid and frontal sinuses.She also has acutely developed voice loss. headache and generalised body aches. called out of work today. Prio to this she had been doing well with continued regulr exercise with attention to diet. She has had no recent skin infection, and denies depression or anxiety or insomnia.   Current Medications (verified): 1)  Lipitor 80 Mg  Tabs (Atorvastatin Calcium) .... One Tab By Mouth At Bedtime 2)  Ibu 800 Mg Tabs (Ibuprofen) .... Take 1 Tablet By Mouth Three Times A Day As Needed 3)  Mastectomy Bra .... X 6 4)  Synthroid 125 Mcg Tabs (Levothyroxine Sodium) .... Take 1 Tablet By Mouth Once A Day 5)  Zyrtec Hives Relief 10 Mg Tabs (Cetirizine Hcl) .... Take 1 Tablet By Mouth Once A Day 6)  Vitamin D (Ergocalciferol) 50000 Unit Caps (Ergocalciferol) .... One Tab Once A Week  Allergies (verified): 1)  ! Sulfa  Review of Systems      See HPI General:  Complains of chills, fatigue, and fever. Eyes:  Denies blurring and discharge. ENT:  Complains of nasal congestion,  postnasal drainage, sinus pressure, and sore throat. CV:  Denies chest pain or discomfort, palpitations, and swelling of feet. Resp:  Complains of cough, shortness of breath, sputum productive, and wheezing. GI:  Denies abdominal pain, constipation, diarrhea, nausea, and vomiting. GU:  Denies dysuria and urinary frequency. MS:  Denies joint pain and stiffness. Neuro:  Complains of headaches and sensation of room spinning; denies seizures; acute with currentillness. Endo:  Denies cold intolerance, excessive thirst, excessive urination, and heat intolerance. Heme:  Denies abnormal bruising and bleeding. Allergy:  Complains of seasonal allergies; denies hives or rash and itching eyes.  Physical Exam  General:  Well-developed,well-nourished,in no acute distress; alert,appropriate and cooperative throughout examinationIll appearing. HEENT: No facial asymmetry,  EOMI, frontal, mxillary and ethmidal  sinus tenderness, TM's Clear, oropharynx  pink and moist.   Chest: decreased air entry, bilteral crackles and wheezes  CVS: S1, S2, No murmurs, No S3.   Abd: Soft, Nontender.  MS: Adequate ROM spine, hips, shoulders and knees.  Ext: No edema.   CNS: CN 2-12 intact, power tone and sensation normal throughout.   Skin: Intact, no visible lesions or rashes.  Psych: Good eye contact, normal affect.  Memory intact, not anxious or depressed appearing.    Impression & Recommendations:  Problem # 1:  LARYNGITIS, ACUTE (ICD-464.00) Assessment Comment Only  Problem # 2:  HEADACHE (ICD-784.0) Assessment: Comment Only  Her updated medication list for this problem includes:    Ibu 800 Mg Tabs (Ibuprofen) .Marland Kitchen... Take 1 tablet by mouth three times a day as needed  Orders: Depo- Medrol 80mg  (J1040) Ketorolac-Toradol 15mg  (E9528)  Problem # 3:  ACUTE SINUSITIS, UNSPECIFIED (ICD-461.9) Assessment: Comment Only  Her updated medication list for this problem includes:    Levaquin 750 Mg Tabs  (Levofloxacin) .Marland Kitchen... Take 1 tablet by mouth once a day    Tessalon Perles 100 Mg Caps (Benzonatate) .Marland Kitchen... Take 1 tablet by mouth three times a day  Problem # 4:  ACUTE BRONCHITIS (ICD-466.0) Assessment: Comment Only  Her updated medication list for this problem includes:    Levaquin 750 Mg Tabs (Levofloxacin) .Marland Kitchen... Take 1 tablet by mouth once a day    Tessalon Perles 100 Mg Caps (Benzonatate) .Marland Kitchen... Take 1 tablet by mouth three times a day  Orders: CXR- 2view (CXR) T-Culture, Sputum & Gram Stain (87070/[id number]) Albuterol Sulfate Sol 1mg  unit dose (U1324) Nebulizer Tx (40102) Rocephin  250mg  (V2536) Admin of Therapeutic Inj  intramuscular or subcutaneous (64403)  Problem # 5:  VERTIGO (ICD-780.4) Assessment: Deteriorated  Her updated medication list for this problem includes:    Zyrtec Hives Relief 10 Mg Tabs (Cetirizine hcl) .Marland Kitchen... Take 1 tablet by mouth once a day  Problem # 6:  OBESITY, UNSPECIFIED (ICD-278.00) Assessment: Improved  Ht: 66 (04/25/2010)   Wt: 200.25 (04/25/2010)   BMI: 32.44 (04/25/2010)  Complete Medication List: 1)  Lipitor 80 Mg Tabs (Atorvastatin calcium) .... One tab by mouth at bedtime 2)  Ibu 800 Mg Tabs (Ibuprofen) .... Take 1 tablet by mouth three times a day as needed 3)  Mastectomy Bra  .... X 6 4)  Synthroid 125 Mcg Tabs (Levothyroxine sodium) .... Take 1 tablet by mouth once a day 5)  Zyrtec Hives Relief 10 Mg Tabs (Cetirizine hcl) .... Take 1 tablet by mouth once a day 6)  Vitamin D (ergocalciferol) 50000 Unit Caps (Ergocalciferol) .... One tab once a week 7)  Levaquin 750 Mg Tabs (Levofloxacin) .... Take 1 tablet by mouth once a day 8)  Tessalon Perles 100 Mg Caps (Benzonatate) .... Take 1 tablet by mouth three times a day  Other Orders: T-CBC w/Diff ([id number]) Atrovent 1mg  (Neb) [phone])  Patient Instructions: 1)  f/u as before. 2)  you are being treated for sinusitis, bronchitis, laryngitos and headache. 3)  you will get injections  in the office, and a breathing treatment,and meds are also being sent to your pharmacy. 4)  please drink alot of water and lemonade, gargle with salt water 3 times daily, rest , salt water nasal flushes 3 times daily 5)  cXR today 6)  cBCand diff today.sputum culture. 7)  work excuse to return on7/25/2011. 8)  call if no better by friday Prescriptions: TESSALON PERLES 100 MG CAPS (BENZONATATE) Take 1 tablet by mouth three times a day  #30 x 0   Entered and Authorized by:   [name] MD   Signed by:   [name] MD on 04/25/2010   Method used:   Electronically to        [facility] (retail)       [address]       [place], [place]  [address]       Ph: [phone]       Fax: [phone]   RxID:   [id number] LEVAQUIN 750  MG TABS (LEVOFLOXACIN) Take 1 tablet by mouth once a day  #5 x 0   Entered and Authorized by:   [name] MD   Signed by:   [name] MD on 04/25/2010   Method used:   Electronically to        [facility] (retail)       [address]       [place], [place]  [address]       Ph: [phone]       Fax: [phone]   RxID:   [id number]    Medication Administration  Injection # 1:    Medication: Depo- Medrol 80mg     Diagnosis: HEADACHE (ICD-784.0)    Route: IM    Site: RUOQ gluteus    Exp Date: 01/2011    Lot #: [id number]    Mfr: Pharmacia    Comments: 80mg  given     Patient tolerated injection without complications    Given by: Everitt Amber LPN (April 25, 2010 12:46 PM)  Injection # 2:    Medication: Ketorolac-Toradol 15mg     Diagnosis: HEADACHE (ICD-784.0)    Route: IM    Site: LUOQ gluteus    Exp Date: 12/2011    Lot #: [id number]    Mfr: [email or web address]    Comments: 60mg  given    Patient tolerated injection without complications    Given by: Everitt Amber LPN (April 25, 2010 12:46 PM)  Injection # 3:    Medication: Rocephin  250mg     Diagnosis: ACUTE  BRONCHITIS (ICD-466.0)    Route: IM    Site: LUOQ gluteus    Exp Date: 10/2011    Lot #: [id number]    Mfr: sandoz    Comments: 500mg  given     Patient tolerated injection without complications    Given by: Everitt Amber LPN (April 25, 2010 12:47 PM)  Medication # 1:    Medication: Albuterol Sulfate Sol 1mg  unit dose    Diagnosis: ACUTE BRONCHITIS (ICD-466.0)    Dose: 2.5/16ml    Route: inhaled    Exp Date: 8/12    Lot #: [id number]    Mfr: nephron    Patient tolerated medication without complications    Given by: Everitt Amber LPN (April 25, 2010 12:43 PM)  Medication # 2:    Medication: Atrovent 1mg  (Neb)    Diagnosis: 0.5/ACUTE BRONCHITIS (ICD-466.0)    Dose: 0.5/2.81ml    Route: inhaled    Exp Date: 10/12    Lot #: [id number]    Mfr: nephron    Patient tolerated medication without complications    Given by: Everitt Amber LPN (April 25, 2010 12:44 PM)  Orders Added: 1)  Est. Patient Level IV [40347] 2)  CXR- 2view [CXR] 3)  T-CBC w/Diff [[id number]] 4)  T-Culture, Sputum & Gram Stain [87070/[id number]] 5)  Albuterol Sulfate Sol 1mg  unit dose [J7613] 6)  Atrovent 1mg  (Neb) [I4332] 7)  Nebulizer Tx [95188] 8)  Depo- Medrol 80mg  [J1040] 9)  Ketorolac-Toradol 15mg  [J1885] 10)  Rocephin  250mg  [J0696] 11)  Admin of Therapeutic Inj  intramuscular or subcutaneous [41660]

## 2010-11-08 NOTE — Miscellaneous (Signed)
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 Laboratory test [phone]) - Signed

## 2010-11-09 NOTE — Letter (Signed)
Summary: Letter  Letter   Imported By: Lind Guest 08/01/2010 12:48:59  _____________________________________________________________________  External Attachment:    Type:   Image     Comment:   External Document

## 2010-11-28 ENCOUNTER — Encounter: Payer: Self-pay | Admitting: Family Medicine

## 2010-11-28 ENCOUNTER — Ambulatory Visit (INDEPENDENT_AMBULATORY_CARE_PROVIDER_SITE_OTHER): Payer: 59 | Admitting: Family Medicine

## 2010-11-28 DIAGNOSIS — L049 Acute lymphadenitis, unspecified: Secondary | ICD-10-CM | POA: Insufficient documentation

## 2010-12-04 NOTE — Assessment & Plan Note (Signed)
Summary: office visit   Vital Signs:  Patient profile:   50 year old female Menstrual status:  irregular Height:      66 inches Weight:      203 pounds BMI:     32.88 O2 Sat:      96 % Pulse rate:   76 / minute Pulse rhythm:   regular Resp:     16 per minute BP sitting:   130 / 84  (left arm) Cuff size:   large  Vitals Entered By: Everitt Amber LPN (November 28, 2010 4:15 PM)  Nutrition Counseling: Patient's BMI is greater than 25 and therefore counseled on weight management options. CC: c/o boil on labia, noticed it yesterday, very sore around the area and in lymph nodes   CC:  c/o boil on labia, noticed it yesterday, and very sore around the area and in lymph nodes.  History of Present Illness: Reports  that she had been well up until yesterday when she noted painful swelling on right labia, also swollen tender gland mearby. Denies recent fever or chills. Denies sinus pressure, nasal congestion , ear pain or sore throat. Denies chest congestion, or cough productive of sputum. Denies chest pain, palpitations, PND, orthopnea or leg swelling. Denies abdominal pain, nausea, vomitting, diarrhea or constipation. Denies change in bowel movements or bloody stool. Denies dysuria , frequency, incontinence or hesitancy. Denies  joint pain, swelling, or reduced mobility. Denies headaches, vertigo, seizures. Denies depression, anxiety or insomnia. Denies  rash, lesions, or itch.     Current Medications (verified): 1)  Mastectomy Bra .... X 6 2)  Synthroid 125 Mcg Tabs (Levothyroxine Sodium) .... Take 1 Tablet By Mouth Once A Day 3)  Vitamin D (Ergocalciferol) 50000 Unit Caps (Ergocalciferol) .... One Tab Once A Week 4)  Lipitor 80 Mg Tabs (Atorvastatin Calcium) .... Take 1 Tab By Mouth At Bedtime  Allergies (verified): 1)  ! Sulfa  Review of Systems Derm:  Complains of lesion(s).  Physical Exam  General:  Well-developed,well-nourished,in no acute distress; alert,appropriate  and cooperative throughout examination HEENT: No facial asymmetry,  EOMI, No sinus tenderness, , oropharynx  pink and moist.   Chest: Clear to auscultation bilaterally.  CVS: S1, S2, No murmurs, No S3.   Abd: Soft, Nontender.  MS: Adequate ROM spine, hips, shoulders and knees.  Ext: No edema.   CNS: CN 2-12 intact, power tone and sensation normal throughout.   Skin: Intact, right inguinallymphadenopathy  Psych: Good eye contact, normal affect.  Memory intact, not anxious or depressed appearing.    Impression & Recommendations:  Problem # 1:  ACUTE LYMPHADENITIS (ICD-683) Assessment Comment Only  Her updated medication list for this problem includes:    Doxycycline Hyclate 100 Mg Tabs (Doxycycline hyclate) .Marland Kitchen... Take 1 tablet by mouth two times a day  Orders: Rocephin  250mg  (W1191) Admin of Therapeutic Inj  intramuscular or subcutaneous (47829)  Problem # 2:  HYPOTHYROIDISM (ICD-244.9) Assessment: Comment Only  Her updated medication list for this problem includes:    Synthroid 125 Mcg Tabs (Levothyroxine sodium) .Marland Kitchen... Take 1 tablet by mouth once a day  Labs Reviewed: TSH: 0.349 (07/23/2010)    Chol: 226 (07/23/2010)   HDL: 53 (07/23/2010)   LDL: 160 (07/23/2010)   TG: 63 (07/23/2010)  Problem # 3:  OBESITY, UNSPECIFIED (ICD-278.00) Assessment: Unchanged  Ht: 66 (11/28/2010)   Wt: 203 (11/28/2010)   BMI: 32.88 (11/28/2010) therapeutic lifestyle change discussed and encouraged  Complete Medication List: 1)  Mastectomy Bra  .... X  6 2)  Synthroid 125 Mcg Tabs (Levothyroxine sodium) .... Take 1 tablet by mouth once a day 3)  Vitamin D (ergocalciferol) 50000 Unit Caps (Ergocalciferol) .... One tab once a week 4)  Lipitor 80 Mg Tabs (Atorvastatin calcium) .... Take 1 tab by mouth at bedtime 5)  Doxycycline Hyclate 100 Mg Tabs (Doxycycline hyclate) .... Take 1 tablet by mouth two times a day 6)  Fluconazole 150 Mg Tabs (Fluconazole) .... Take 1 tablet by mouth once a day as  needed for vaginal itch  Patient Instructions: 1)  F/U as before. 2)  You are being treated for right inguinal lymphadenitis. 3)  You will get rocephin in the office today, and antibiotics are also sent to your pharmacy Prescriptions: FLUCONAZOLE 150 MG TABS (FLUCONAZOLE) Take 1 tablet by mouth once a day as needed for vaginal itch  #3 x 0   Entered and Authorized by:   [name] MD   Signed by:   [name] MD on 11/28/2010   Method used:   Electronically to        [facility] (retail)       [address]       [place], [place]  [address]       Ph: [phone]       Fax: [phone]   RxID:   [id number] DOXYCYCLINE HYCLATE 100 MG TABS (DOXYCYCLINE HYCLATE) Take 1 tablet by mouth two times a day  #20 x 0   Entered and Authorized by:   [name] MD   Signed by:   [name] MD on 11/28/2010   Method used:   Electronically to        [facility] (retail)       [address]       [place], [place]  [address]       Ph: [phone]       Fax: [phone]   RxID:   [id number]    Medication Administration  Injection # 1:    Medication: Rocephin  250mg     Diagnosis: ACUTE LYMPHADENITIS (ICD-683)    Route: IM    Site: RUOQ gluteus    Exp Date: 02/2013    Lot #: [id number]    Mfr: novaplus    Comments: 500mg  given     Patient tolerated injection without complications    Given by: Everitt Amber LPN (November 28, 2010 5:08 PM)  Orders Added: 1)  Est. Patient Level III [06301] 2)  Rocephin  250mg  [J0696] 3)  Admin of Therapeutic Inj  intramuscular or subcutaneous [96372]     Medication Administration  Injection # 1:    Medication: Rocephin  250mg     Diagnosis: ACUTE LYMPHADENITIS (ICD-683)    Route: IM    Site: RUOQ gluteus    Exp Date: 02/2013    Lot #: [id number]    Mfr: novaplus    Comments: 500mg  given     Patient tolerated injection without complications    Given by:  Everitt Amber LPN (November 28, 2010 5:08 PM)  Orders Added: 1)  Est. Patient Level III [23557] 2)  Rocephin  250mg  [J0696] 3)  Admin of Therapeutic Inj  intramuscular or subcutaneous [32202]

## 2010-12-18 LAB — CBC
MCH: 30.3 pg (ref 26.0–34.0)
MCV: 92.3 fL (ref 78.0–100.0)
Platelets: 207 10*3/uL (ref 150–400)
RDW: 14.1 % (ref 11.5–15.5)
WBC: 5.1 10*3/uL (ref 4.0–10.5)

## 2010-12-18 LAB — BASIC METABOLIC PANEL
BUN: 10 mg/dL (ref 6–23)
CO2: 26 mEq/L (ref 19–32)
Chloride: 104 mEq/L (ref 96–112)
Creatinine, Ser: 0.74 mg/dL (ref 0.4–1.2)

## 2010-12-18 LAB — DIFFERENTIAL
Basophils Absolute: 0 10*3/uL (ref 0.0–0.1)
Eosinophils Absolute: 0 10*3/uL (ref 0.0–0.7)
Eosinophils Relative: 1 % (ref 0–5)

## 2010-12-18 LAB — POCT CARDIAC MARKERS
CKMB, poc: <1 ng/mL — ABNORMAL LOW (ref 1.0–8.0)
Myoglobin, poc: 47.7 ng/mL (ref 12–200)
Myoglobin, poc: 54.4 ng/mL (ref 12–200)

## 2010-12-25 ENCOUNTER — Other Ambulatory Visit: Payer: Self-pay | Admitting: Family Medicine

## 2011-01-03 ENCOUNTER — Telehealth: Payer: Self-pay | Admitting: Family Medicine

## 2011-01-04 NOTE — Telephone Encounter (Signed)
Green discharge, head stopped up, coughing up greenish phlegm, denies fever chills or body aches. Advised urgent care eval

## 2011-01-05 ENCOUNTER — Inpatient Hospital Stay (INDEPENDENT_AMBULATORY_CARE_PROVIDER_SITE_OTHER)
Admission: RE | Admit: 2011-01-05 | Discharge: 2011-01-05 | Disposition: A | Discharge status: Home / Self Care | Payer: 59 | Source: Ambulatory Visit | Attending: Family Medicine | Admitting: Family Medicine

## 2011-01-05 DIAGNOSIS — J019 Acute sinusitis, unspecified: Secondary | ICD-10-CM

## 2011-01-05 DIAGNOSIS — J309 Allergic rhinitis, unspecified: Secondary | ICD-10-CM

## 2011-02-22 NOTE — Discharge Summary (Signed)
   NAMEBETTYJEAN, Deanna Bush                            ACCOUNT NO.:  [id number]   MEDICAL RECORD NO.:  [id number]                   PATIENT TYPE:  INP   LOCATION:  A306                                 FACILITY:  [facility]   PHYSICIAN:  Dirk Dress. Katrinka Blazing, M.D.                DATE OF BIRTH:  31-May-1961   DATE OF ADMISSION:  06/11/2002  DATE OF DISCHARGE:  06/14/2002                                 DISCHARGE SUMMARY   DISCHARGE DIAGNOSES:  1. Invasive carcinoma left breast.  2. Hypertension.  3. Hypothyroidism.  4. Hyperlipidemia.   SPECIAL PROCEDURE:  Left modified radical mastectomy on September 5.   DISPOSITION:  The patient is discharged home in stable satisfactory  condition.   DISCHARGE MEDICATIONS:  1. Tylox two q.4h. as needed for pain.  2. Levaquin 500 mg q.d.  3. Synthroid 88 mcg q.d.  4. Lipitor 10 mg q.h.s.   FOLLOW UP:  She is scheduled to be seen in the office on September 22.  She  will have home health nurse follow up for wound care and management of her  JP drains.   SUMMARY:  A 50 year old female with a history of abnormal calcifications of  her left breast status post stereotactic biopsy on August 25.  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  She also had multiple areas of atypical ductal hyperplasia.  The  patient has a family history of breast cancer in two sisters.  She was given  option of wide excision with node dissection, total breast radiation, or  modified radical.  She opted for modified radical after discussion with her  family members.  Past history otherwise unremarkable.  She was admitted  through day surgery and underwent modified radical mastectomy on September  5.  She had no difficulty in the postoperative period.  She was monitored  until the morning of the third hospital day.  Wound was healing nicely.  JP  drainage was decreasing.  She was discharged home in satisfactory condition  with plans for  follow-up as noted above.                                               Dirk Dress. Katrinka Blazing, M.D.    LCS/MEDQ  D:  08/07/2002  T:  08/09/2002  Job:  [id number]

## 2011-02-22 NOTE — Op Note (Signed)
   [name], Deanna Bush                            ACCOUNT NO.:  [id number]   MEDICAL RECORD NO.:  [id number]                   PATIENT TYPE:  AMB   LOCATION:  DAY                                  FACILITY:  [facility]   PHYSICIAN:  Jerolyn Shin C. Katrinka Blazing, M.D.                DATE OF BIRTH:  05-11-1961   DATE OF PROCEDURE:  DATE OF DISCHARGE:                                 OPERATIVE REPORT   PREOPERATIVE DIAGNOSIS:  Invasive carcinoma left breast.   POSTOPERATIVE DIAGNOSIS:  Invasive carcinoma left breast.   PROCEDURE:  Left modified radical mastectomy.   SURGEON:  Dirk Dress. Katrinka Blazing, M.D.   INDICATIONS:  The patient is a 50 year old female who had a change in her  mammogram from 2002 to 2003. She had stereotatic biopsy which showed  invasive carcinoma with surrounding ductal carcinoma in situ and extensive  background of atypical hyperplasia.  The patient has a sister with invasive  carcinoma who has undergone mastectomy.  She was given the options of wide  excision with node sampling, wide excision with follow up radiation, and  mastectomy.  She has opted for a mastectomy.   DESCRIPTION OF PROCEDURE:  Under general endotracheal anesthesia the  patient's left breast, chest, neck, and arm were prepped and draped in the a  sterile field.  An elliptical incision was made extending from the medial  aspect of the breast encompassing the previous biopsy site and extending to  the axilla.  Superior and inferior breast flaps were developed with  electrocautery.  Breasts were separated from the chest wall including  removal of the pectoralis fascia.  Dissection was extended into the axilla.  A long thoracic nerve and the thoracodorsal neurovascular bundle were  preserved.  All other tissue was dissected.  Hemostasis was achieved with  hemoclips.  Once the tissue was removed hemostasis was achieved.  A JP drain  was placed under each flap. The flaps were closed with 2-0 Biosyn and 3-0  Biosyn.  The final  skin closure was carried out with staples.  The drains  were secured with 3-0 Nylon.  Sterile dressing was placed. The patient  tolerated the procedure well.  She was awakened from anesthesia, transferred  to a bed, and taken to the postanesthetic care unit.                                                Dirk Dress. Katrinka Blazing, M.D.    LCS/MEDQ  D:  06/11/2002  T:  06/11/2002  Job:  [id number]

## 2011-02-22 NOTE — H&P (Signed)
   [name], [name]                            ACCOUNT NO.:  [id number]   MEDICAL RECORD NO.:  [id number]                   PATIENT TYPE:  AMB   LOCATION:  DAY                                  FACILITY:  [facility]   PHYSICIAN:  Jerolyn Shin C. Katrinka Bush, M.D.                DATE OF BIRTH:  Jul 11, 1961   DATE OF ADMISSION:  DATE OF DISCHARGE:                                HISTORY & PHYSICAL   HISTORY OF PRESENT ILLNESS:  50-year-old female with a history of  abnormal calcifications of her left breast.  She is status post left  stereotactic biopsy on May 31, 2002.  The pathology showed invasive  mammary carcinoma with a small focus of DCIS, which is ductal carcinoma in  situ.  She had multiple little areas of atypical ductal hyperplasia.  The  patient was referred for completion of surgery.  She has a family history of  breast cancer in that her oldest sister had breast cancer at age 52.  The  patient used oral contraceptives for 20 years.  She was given the option of  wide excision with node dissection, followup total breast radiation or  modified radical mastectomy; she has opted for modified radical mastectomy  after further discussion.   PAST HISTORY:  She has hypertension and hypothyroidism and hyperlipidemia.   MEDICATIONS:  1. Synthroid 88 mcg q.d.  2. Lipitor 10 mg q.d.   PAST SURGICAL HISTORY:  C-sections x2 and bilateral tubal ligation.   ALLERGIES:  She has no known drug allergies.   REVIEW OF SYSTEMS:  Review of systems otherwise entirely normal.   PHYSICAL EXAMINATION:  VITAL SIGNS:  Blood pressure 120/80, pulse 72,  respirations 18.  Weight 160-1/2 pounds.  HEENT:  Unremarkable.  NECK:  Neck is supple without JVD or bruit.  CHEST:  Clear to auscultation.  No rales, rub, rhonchi or wheezes.  BREASTS:  Unremarkable except for puncture site which is well-healed in the  left lower inner quadrant.  Axillae normal with no palpable adenopathy.  EXTREMITIES:  No cyanosis,  clubbing or edema.  NEUROLOGIC:  No motor, sensory or cerebellar deficit.  Cranial nerves are  intact.  Deep tendon reflexes are symmetric and intact.    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   PLAN:  Modified radical mastectomy.                                               Deanna Bush, M.D.    LCS/MEDQ  D:  06/10/2002  T:  06/11/2002  Job:  [id number]

## 2011-03-11 ENCOUNTER — Encounter: Payer: Self-pay | Admitting: Family Medicine

## 2011-03-12 ENCOUNTER — Ambulatory Visit: Payer: 59 | Admitting: Family Medicine

## 2011-03-12 ENCOUNTER — Encounter: Payer: Self-pay | Admitting: Family Medicine

## 2011-03-12 ENCOUNTER — Ambulatory Visit (INDEPENDENT_AMBULATORY_CARE_PROVIDER_SITE_OTHER): Payer: 59 | Admitting: Family Medicine

## 2011-03-12 VITALS — BP 100/64 | HR 60 | Resp 16 | Ht 66.25 in | Wt 205.1 lb

## 2011-03-12 DIAGNOSIS — E669 Obesity, unspecified: Secondary | ICD-10-CM

## 2011-03-12 DIAGNOSIS — E739 Lactose intolerance, unspecified: Secondary | ICD-10-CM

## 2011-03-12 DIAGNOSIS — E039 Hypothyroidism, unspecified: Secondary | ICD-10-CM

## 2011-03-12 DIAGNOSIS — Z23 Encounter for immunization: Secondary | ICD-10-CM

## 2011-03-12 DIAGNOSIS — E559 Vitamin D deficiency, unspecified: Secondary | ICD-10-CM

## 2011-03-12 DIAGNOSIS — R5383 Other fatigue: Secondary | ICD-10-CM

## 2011-03-12 DIAGNOSIS — E785 Hyperlipidemia, unspecified: Secondary | ICD-10-CM

## 2011-03-12 DIAGNOSIS — R5381 Other malaise: Secondary | ICD-10-CM

## 2011-03-12 NOTE — Patient Instructions (Signed)
CPE  in 4 months.  Fasting labs asap and in 4 months.  It is important that you exercise regularly at least 40 minutes 7 times a week. If you develop chest pain, have severe difficulty breathing, or feel very tired, stop exercising immediately and seek medical attention  A healthy diet is rich in fruit, vegetables and whole grains. Poultry fish, nuts and beans are a healthy choice for protein rather then red meat. A low sodium diet and drinking 64 ounces of water daily is generally recommended. Oils and sweet should be limited. Carbohydrates especially for those who are diabetic or overweight, should be limited to 30-45 gram per meal. It is important to eat on a regular schedule, at least 3 times daily. Snacks should be primarily fruits, vegetables or nuts.  Weight loss goal in 4 to 8 pounds  Mammogram  Due Oct 17 pls sched

## 2011-03-12 NOTE — Progress Notes (Signed)
  Subjective:    Patient ID: Deanna Bush, female    DOB: January 19, 1961, 50 y.o.   MRN: [id number]  HPI The PT is here for follow up and re-evaluation of chronic medical conditions, medication management and review of recent lab and radiology data.  Preventive health is updated, specifically  Cancer screening, and Immunization.   Questions or concerns regarding consultations or procedures which the PT has had in the interim are  addressed. The PT denies any adverse reactions to current medications since the last visit.  There are no new concerns.  There are no specific complaints      Review of Systems Denies recent fever or chills. Denies sinus pressure, nasal congestion, ear pain or sore throat. Denies chest congestion, productive cough or wheezing. Denies chest pains, palpitations, paroxysmal nocturnal dyspnea, orthopnea and leg swelling Denies abdominal pain, nausea, vomiting,diarrhea or constipation.  Denies rectal bleeding or change in bowel movement. Denies dysuria, frequency, hesitancy or incontinence. Denies joint pain, swelling and limitation in mobility. Denies headaches, seizure, numbness, or tingling. Denies depression, anxiety or insomnia. Denies skin break down or rash.        Objective:   Physical Exam Patient alert and oriented and in no Cardiopulmonary distress.  HEENT: No facial asymmetry, EOMI, no sinus tenderness, TM's clear, Oropharynx pink and moist.  Neck supple no adenopathy.  Chest: Clear to auscultation bilaterally.  CVS: S1, S2 no murmurs, no S3.  ABD: Soft non tender. Bowel sounds normal.  Ext: No edema  MS: Adequate ROM spine, shoulders, hips and knees.  Skin: Intact, no ulcerations or rash noted.  Psych: Good eye contact, normal affect. Memory intact not anxious or depressed appearing.  CNS: CN 2-12 intact, power, tone and sensation normal throughout.        Assessment & Plan:

## 2011-03-13 LAB — HEPATIC FUNCTION PANEL
ALT: 16 U/L (ref 0–35)
AST: 16 U/L (ref 0–37)
Albumin: 4.2 g/dL (ref 3.5–5.2)
Alkaline Phosphatase: 86 U/L (ref 39–117)
Bilirubin, Direct: 0.1 mg/dL (ref 0.0–0.3)

## 2011-03-13 LAB — LIPID PANEL
Cholesterol: 139 mg/dL (ref 0–200)
VLDL: 9 mg/dL (ref 0–40)

## 2011-03-13 LAB — VITAMIN D 25 HYDROXY (VIT D DEFICIENCY, FRACTURES): Vit D, 25-Hydroxy: 35 ng/mL (ref 30–89)

## 2011-03-13 LAB — HEMOGLOBIN A1C: Hgb A1c MFr Bld: 6.4 % — ABNORMAL HIGH (ref <5.7)

## 2011-03-13 LAB — TSH: TSH: 0.138 u[IU]/mL — ABNORMAL LOW (ref 0.350–4.500)

## 2011-03-19 NOTE — Progress Notes (Signed)
Called patient no answer.

## 2011-03-24 NOTE — Assessment & Plan Note (Signed)
Updated data needed to determine control

## 2011-03-24 NOTE — Assessment & Plan Note (Signed)
Uncontrolled in nov, pt advised to be compliant with low fat diet and prescribed med, need updated data

## 2011-03-24 NOTE — Assessment & Plan Note (Signed)
Deteriorated, greater diligence to lifestyle change to facilitate weight loss needed

## 2011-03-24 NOTE — Assessment & Plan Note (Signed)
[  pt needs to take weekly supplements until corrected

## 2011-03-26 ENCOUNTER — Telehealth: Payer: Self-pay | Admitting: *Deleted

## 2011-03-26 NOTE — Telephone Encounter (Signed)
Attempted to call patient several time regarding lab results. Attempts were unsuccessful. Mailed letter

## 2011-03-28 ENCOUNTER — Other Ambulatory Visit: Payer: Self-pay | Admitting: Family Medicine

## 2011-03-29 ENCOUNTER — Telehealth: Payer: Self-pay | Admitting: Family Medicine

## 2011-03-29 NOTE — Telephone Encounter (Signed)
Wants the dr to call her back at [phone] or [phone]

## 2011-04-01 NOTE — Telephone Encounter (Signed)
Called patient left message

## 2011-04-01 NOTE — Telephone Encounter (Signed)
Called patient no answer.

## 2011-04-02 NOTE — Telephone Encounter (Signed)
Patient aware of bloodwork results. Was unable to append the actual result note. Changes to synthroid made and referred for DM ed

## 2011-05-26 ENCOUNTER — Other Ambulatory Visit: Payer: Self-pay | Admitting: Family Medicine

## 2011-06-04 ENCOUNTER — Other Ambulatory Visit: Payer: Self-pay | Admitting: Family Medicine

## 2011-07-12 ENCOUNTER — Other Ambulatory Visit: Payer: Self-pay | Admitting: Family Medicine

## 2011-07-12 DIAGNOSIS — Z139 Encounter for screening, unspecified: Secondary | ICD-10-CM

## 2011-07-15 LAB — BASIC METABOLIC PANEL
BUN: 7
CO2: 28
Chloride: 104
Creatinine, Ser: 0.72
Glucose, Bld: 109 — ABNORMAL HIGH
Potassium: 3.6

## 2011-07-15 LAB — CBC
HCT: 33.8 — ABNORMAL LOW
MCHC: 33.1
MCV: 91.2
Platelets: 295
WBC: 10

## 2011-07-15 LAB — DIFFERENTIAL
Basophils Relative: 0
Eosinophils Absolute: 0 — ABNORMAL LOW
Eosinophils Relative: 0
Lymphs Abs: 0.8
Neutrophils Relative %: 88 — ABNORMAL HIGH

## 2011-07-24 ENCOUNTER — Other Ambulatory Visit: Payer: Self-pay | Admitting: Family Medicine

## 2011-07-25 LAB — CBC WITH DIFFERENTIAL/PLATELET
Basophils Relative: 0 % (ref 0–1)
Eosinophils Absolute: 0.1 10*3/uL (ref 0.0–0.7)
Eosinophils Relative: 1 % (ref 0–5)
HCT: 35.2 % — ABNORMAL LOW (ref 36.0–46.0)
Hemoglobin: 11.1 g/dL — ABNORMAL LOW (ref 12.0–15.0)
Lymphs Abs: 2 10*3/uL (ref 0.7–4.0)
MCH: 29 pg (ref 26.0–34.0)
MCHC: 31.5 g/dL (ref 30.0–36.0)
MCV: 91.9 fL (ref 78.0–100.0)
Monocytes Absolute: 0.3 10*3/uL (ref 0.1–1.0)
Monocytes Relative: 6 % (ref 3–12)
Neutrophils Relative %: 48 % (ref 43–77)
RBC: 3.83 MIL/uL — ABNORMAL LOW (ref 3.87–5.11)

## 2011-07-25 LAB — LIPID PANEL
Cholesterol: 121 mg/dL (ref 0–200)
Triglycerides: 48 mg/dL (ref <150)
VLDL: 10 mg/dL (ref 0–40)

## 2011-07-25 LAB — HEPATIC FUNCTION PANEL
ALT: 12 U/L (ref 0–35)
Albumin: 4.1 g/dL (ref 3.5–5.2)
Bilirubin, Direct: 0.1 mg/dL (ref 0.0–0.3)

## 2011-07-29 LAB — ANEMIA PANEL
Ferritin: 71 ng/mL (ref 10–291)
Folate: >20 ng/mL
TIBC: 287 ug/dL (ref 250–470)
UIBC: 150 ug/dL (ref 125–400)
Vitamin B-12: 667 pg/mL (ref 211–911)

## 2011-07-30 ENCOUNTER — Ambulatory Visit (HOSPITAL_COMMUNITY)
Admission: RE | Admit: 2011-07-30 | Discharge: 2011-07-30 | Disposition: A | Discharge status: Home / Self Care | Payer: 59 | Source: Ambulatory Visit | Attending: Family Medicine | Admitting: Family Medicine

## 2011-07-30 ENCOUNTER — Encounter: Payer: Self-pay | Admitting: Family Medicine

## 2011-07-30 DIAGNOSIS — Z139 Encounter for screening, unspecified: Secondary | ICD-10-CM

## 2011-07-30 DIAGNOSIS — Z1231 Encounter for screening mammogram for malignant neoplasm of breast: Secondary | ICD-10-CM | POA: Insufficient documentation

## 2011-07-31 ENCOUNTER — Other Ambulatory Visit (HOSPITAL_COMMUNITY)
Admission: RE | Admit: 2011-07-31 | Discharge: 2011-07-31 | Disposition: A | Discharge status: Home / Self Care | Payer: 59 | Source: Ambulatory Visit | Attending: Family Medicine | Admitting: Family Medicine

## 2011-07-31 ENCOUNTER — Ambulatory Visit (INDEPENDENT_AMBULATORY_CARE_PROVIDER_SITE_OTHER): Payer: 59 | Admitting: Family Medicine

## 2011-07-31 ENCOUNTER — Encounter: Payer: Self-pay | Admitting: Family Medicine

## 2011-07-31 VITALS — BP 130/70 | HR 60 | Resp 16 | Ht 66.0 in | Wt 197.0 lb

## 2011-07-31 DIAGNOSIS — L0390 Cellulitis, unspecified: Secondary | ICD-10-CM | POA: Insufficient documentation

## 2011-07-31 DIAGNOSIS — E039 Hypothyroidism, unspecified: Secondary | ICD-10-CM

## 2011-07-31 DIAGNOSIS — E739 Lactose intolerance, unspecified: Secondary | ICD-10-CM

## 2011-07-31 DIAGNOSIS — Z124 Encounter for screening for malignant neoplasm of cervix: Secondary | ICD-10-CM

## 2011-07-31 DIAGNOSIS — E669 Obesity, unspecified: Secondary | ICD-10-CM

## 2011-07-31 DIAGNOSIS — Z1211 Encounter for screening for malignant neoplasm of colon: Secondary | ICD-10-CM

## 2011-07-31 DIAGNOSIS — Z01419 Encounter for gynecological examination (general) (routine) without abnormal findings: Secondary | ICD-10-CM | POA: Insufficient documentation

## 2011-07-31 DIAGNOSIS — Z0000 Encounter for general adult medical examination without abnormal findings: Secondary | ICD-10-CM

## 2011-07-31 MED ORDER — DOXYCYCLINE HYCLATE 100 MG PO TABS: 100.0000 mg | ORAL_TABLET | Freq: Two times a day (BID) | ORAL | Status: AC

## 2011-07-31 NOTE — Patient Instructions (Signed)
F/U in 4 months.  HBA1c and TSH in 4 months.  Congrat on weight loss , keep it up  It is important that you exercise regularly at least 30 minutes 5 times a week. If you develop chest pain, have severe difficulty breathing, or feel very tired, stop exercising immediately and seek medical attention

## 2011-08-07 ENCOUNTER — Encounter: Payer: Self-pay | Admitting: *Deleted

## 2011-08-12 ENCOUNTER — Encounter: Payer: Self-pay | Admitting: Family Medicine

## 2011-08-12 NOTE — Progress Notes (Signed)
  Subjective:    Patient ID: Deanna Bush, female    DOB: [birth year], 50 y.o.   MRN: [id number]  HPI The PT is here for  annual examand re-evaluation of chronic medical conditions, medication management and review of any available recent lab and radiology data.  Preventive health is updated, specifically  Cancer screening and Immunization.   Questions or concerns regarding consultations or procedures which the PT has had in the interim are  addressed. The PT denies any adverse reactions to current medications since the last visit.  She has worked hard on dietary change , exercise and weight loss with her new dx of pre diabetes , with great success    Review of Systems See HPI Denies recent fever or chills. Denies sinus pressure, nasal congestion, ear pain or sore throat. Denies chest congestion, productive cough or wheezing. Denies chest pains, palpitations and leg swelling Denies abdominal pain, nausea, vomiting,diarrhea or constipation.   Denies dysuria, frequency, hesitancy or incontinence. Denies joint pain, swelling and limitation in mobility. Denies headaches, seizures, numbness, or tingling. Denies depression, anxiety or insomnia. Denies skin break down or rash.        Objective:   Physical Exam Pleasant well nourished female, alert and oriented x 3, in no cardio-pulmonary distress. Afebrile. HEENT No facial trauma or asymetry. Sinuses non tender.  EOMI, PERTL, fundoscopic exam is normal, no hemorhage or exudate.  External ears normal, tympanic membranes clear. Oropharynx moist, no exudate, fair dentition. Neck: supple, no adenopathy,JVD or thyromegaly.No bruits.  Chest: Clear to ascultation bilaterally.No crackles or wheezes. Non tender to palpation  Breast: Left mastectomy,,no masses. No nipple discharge or inversion. No axillary or supraclavicular adenopathy  Cardiovascular system; Heart sounds normal,  S1 and  S2 ,no S3.  No murmur, or thrill. Apical beat  not displaced Peripheral pulses normal.  Abdomen: Soft, non tender, no organomegaly or masses. No bruits. Bowel sounds normal. No guarding, tenderness or rebound.  Rectal:  No mass. Guaiac negative stool.  GU: External genitalia normal. No lesions. Vaginal canal normal.No discharge. Uterus normal size, no adnexal masses, no cervical motion or adnexal tenderness.  Musculoskeletal exam: Full ROM of spine, hips , shoulders and knees. No deformity ,swelling or crepitus noted. No muscle wasting or atrophy.   Neurologic: Cranial nerves 2 to 12 intact. Power, tone ,sensation and reflexes normal throughout. No disturbance in gait. No tremor.  Skin: Intact, no ulceration, erythema , scaling or rash noted. Pigmentation normal throughout  Psych; Normal mood and affect. Judgement and concentration normal        Assessment & Plan:

## 2011-08-12 NOTE — Assessment & Plan Note (Signed)
Low carb diet discussed and stressed

## 2011-08-12 NOTE — Assessment & Plan Note (Signed)
Controlled, no change in medication  

## 2011-08-12 NOTE — Assessment & Plan Note (Signed)
Improved. Pt applauded on succesful weight loss through lifestyle change, and encouraged to continue same. Weight loss goal set for the next several months.  

## 2011-08-14 ENCOUNTER — Other Ambulatory Visit: Payer: Self-pay | Admitting: Family Medicine

## 2011-08-16 ENCOUNTER — Encounter (HOSPITAL_COMMUNITY): Payer: Self-pay | Admitting: Oncology

## 2011-08-16 ENCOUNTER — Encounter (HOSPITAL_COMMUNITY): Payer: 59 | Attending: Oncology | Admitting: Oncology

## 2011-08-16 DIAGNOSIS — C50919 Malignant neoplasm of unspecified site of unspecified female breast: Secondary | ICD-10-CM

## 2011-08-16 DIAGNOSIS — L089 Local infection of the skin and subcutaneous tissue, unspecified: Secondary | ICD-10-CM | POA: Insufficient documentation

## 2011-08-16 DIAGNOSIS — Z09 Encounter for follow-up examination after completed treatment for conditions other than malignant neoplasm: Secondary | ICD-10-CM | POA: Insufficient documentation

## 2011-08-16 DIAGNOSIS — Z853 Personal history of malignant neoplasm of breast: Secondary | ICD-10-CM | POA: Insufficient documentation

## 2011-08-16 DIAGNOSIS — D0590 Unspecified type of carcinoma in situ of unspecified breast: Secondary | ICD-10-CM

## 2011-08-16 DIAGNOSIS — Z803 Family history of malignant neoplasm of breast: Secondary | ICD-10-CM

## 2011-08-16 HISTORY — DX: Family history of malignant neoplasm of breast: Z80.3

## 2011-08-16 NOTE — Patient Instructions (Signed)
Deanna Bush  [id number] [birth year]   [facility]  Discharge Instructions  RECOMMENDATIONS MADE BY THE CONSULTANT AND ANY TEST RESULTS WILL BE SENT TO YOUR REFERRING DOCTOR.   SPECIAL INSTRUCTIONS/FOLLOW-UP: Return to clinic in 1 year.    I acknowledge that I have been informed and understand all the instructions given to me and received a copy. I do not have any more questions at this time, but understand that I may call the Specialty Clinic at [facility] at [phone] during business hours should I have any further questions or need assistance in obtaining follow-up care.    __________________________________________  _____________  __________ Signature of Patient or Authorized Representative            Date                   Time    __________________________________________ Nurse's Signature

## 2011-08-16 NOTE — Progress Notes (Signed)
Deanna Overman, MD, MD [address], [address] [place] [address]  1. Family history of breast cancer    2. Pustule  Wound culture, Wound culture  3. ADENOCARCINOMA, BREAST, LEFT      CURRENT THERAPY:S/P left modified radical mastectomy on 06/11/2002  INTERVAL HISTORY: Deanna Bush 51 y.o. female returns for  regular  visit for followup of Stage I tubular adenocarcinoma breast cancer.  The patient reports only one complaint.  She shows me a left lateral leg inferior to patella pustule with surrounding erythema.  The patient reports that she has had them in the past on other areas of her legs.  They have resolved and healed, but the left some scarring.  The patient has never been tested for MRSA.  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  Other than the above, the patient reports hot flashes.  These are secondary to menopause.  She asks if there is treatment for that.  I explained that there is no real good studies available, but there is anecdotal treatment consisting of low dose antidepressants such as Zoloft.  She has declined trying this because she does not like to take medications.  The patient denies any other complaints and remains very stable.  I personally reviewed and went over laboratory results with the patient.  The patient will continue getting her yearly mammograms.  I personally reviewed and went over radiographic studies with the patient.   Past Medical History  Diagnosis Date  . Adenocarcinoma of breast     left   . Hyperlipidemia   . Hypothyroidism   . Cellulitis of leg, left   . Family history of breast cancer 08/16/2011    has ADENOCARCINOMA, BREAST, LEFT; HYPOTHYROIDISM; UNSPECIFIED VITAMIN D DEFICIENCY; IMPAIRED GLUCOSE TOLERANCE; HYPERLIPIDEMIA; OBESITY, UNSPECIFIED; ANEMIA; FATIGUE; Cellulitis; and Family history of breast cancer on her problem list.     is allergic to sulfonamide derivatives.  Deanna Bush does not currently  have medications on file.  Past Surgical History  Procedure Date  . Left mastectomy   . Cesarean section     x2    Denies any headaches, dizziness, double vision, fevers, chills, night sweats, nausea, vomiting, diarrhea, constipation, chest pain, heart palpitations, shortness of breath, blood in stool, black tarry stool, urinary pain, urinary burning, urinary frequency, hematuria.   PHYSICAL EXAMINATION  ECOG PERFORMANCE STATUS: 0 - Asymptomatic  Filed Vitals:   08/16/11 1127  BP: 127/74  Pulse: 76  Temp: 97.6 F (36.4 C)    GENERAL:alert, no distress, well nourished, well developed, comfortable, cooperative and smiling SKIN: positive for: left lateral pustule inferior to the patella.  This pustule measures 0.5 cm.  There is surrounding erythema measuring 2 cm.  She denies any pain on palpation of the erythematous area, but does admit to tenderness when contact is made with the pustule. HEAD: Normocephalic EYES: normal EARS: External ears normal OROPHARYNX:mucous membranes are moist  NECK: supple, no adenopathy, no bruits, thyroid normal size, non-tender, without nodularity, no stridor, non-tender, trachea midline LYMPH:  no palpable lymphadenopathy, no hepatosplenomegaly BREAST:right breast normal without mass, skin or nipple changes or axillary nodes, left post-mastectomy site well healed and free of suspicious changes LUNGS: clear to auscultation and percussion HEART: regular rate & rhythm, no murmurs, no gallops, S1 normal and S2 normal ABDOMEN:abdomen soft, non-tender, normal bowel sounds and no hepatosplenomegaly BACK: Back symmetric, no curvature. EXTREMITIES:less then 2 second capillary refill, no joint deformities, effusion, or  inflammation, no edema, no skin discoloration, no clubbing, no cyanosis  NEURO: alert & oriented x 3 with fluent speech, no focal motor/sensory deficits, gait normal   LABORATORY DATA: CBC    Component Value Date/Time   WBC 4.4 07/24/2011  0925   RBC 3.83* 07/24/2011 0925   HGB 11.1* 07/24/2011 0925   HCT 35.2* 07/24/2011 0925   PLT 305 07/24/2011 0925   MCV 91.9 07/24/2011 0925   MCH 29.0 07/24/2011 0925   MCHC 31.5 07/24/2011 0925   RDW 14.5 07/24/2011 0925   LYMPHSABS 2.0 07/24/2011 0925   MONOABS 0.3 07/24/2011 0925   EOSABS 0.1 07/24/2011 0925   BASOSABS 0.0 07/24/2011 0925      Chemistry      Component Value Date/Time   NA 137 08/09/2010 1058   K 3.9 08/09/2010 1058   CL 104 08/09/2010 1058   CO2 26 08/09/2010 1058   BUN 10 08/09/2010 1058   CREATININE 0.74 08/09/2010 1058      Component Value Date/Time   CALCIUM 9.2 08/09/2010 1058   ALKPHOS 79 07/24/2011 0925   AST 15 07/24/2011 0925   ALT 12 07/24/2011 0925   BILITOT 0.6 07/24/2011 0925      RADIOGRAPHIC STUDIES: Mm Digital Screening Unilat R  08/02/2011  DG SCREENING MAMMOGRAM RIGHT CC and MLO view(s) were taken of the right breast.  RIGHT DIGITAL SCREENING MAMMOGRAM WITH CAD:  Comparison:  Prior studies.  There are scattered fibroglandular densities.  There is no dominant mass, architectural distortion  or calcification to suggest malignancy.  Images were processed with CAD.  IMPRESSION:  No mammographic evidence of malignancy.  Suggest yearly screening mammography.  A result letter of this screening mammogram will be mailed directly to the patient.  ASSESSMENT: Negative - BI-RADS 1  Screening mammogram in 1 year.     ASSESSMENT:  1. 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  Patient declined hormonal therapy. 2. Family history of breast cancer 3. Left, lateral pustule inferior to patella   PLAN:  1. Culture pustule and evaluate for MRSA 2. I personally reviewed and went over laboratory results with the patient. 3. I personally reviewed and went over radiographic studies with the patient. 4.  Conintue yearly mammograms 5. Declined treatment for lesion 6. Return in one year for follow-up 7. Will release patient from the clinic after 10 years of follow-up (2013)   All questions were answered. The patient knows to call the clinic with any problems, questions or concerns. We can certainly see the patient much sooner if necessary.  The patient and plan discussed with Glenford Peers, MD and he is in agreement with the aforementioned.  I spent 30 minutes counseling the patient face to face. The total time spent in the appointment was 40 minutes.  KEFALAS,THOMAS

## 2011-08-19 ENCOUNTER — Encounter (HOSPITAL_COMMUNITY): Payer: Self-pay | Admitting: Oncology

## 2011-08-19 ENCOUNTER — Other Ambulatory Visit (HOSPITAL_COMMUNITY): Payer: Self-pay | Admitting: Oncology

## 2011-08-19 DIAGNOSIS — Z22322 Carrier or suspected carrier of Methicillin resistant Staphylococcus aureus: Secondary | ICD-10-CM

## 2011-08-19 HISTORY — DX: Carrier or suspected carrier of methicillin resistant Staphylococcus aureus: Z22.322

## 2011-08-19 LAB — WOUND CULTURE

## 2011-10-30 ENCOUNTER — Other Ambulatory Visit: Payer: Self-pay | Admitting: Family Medicine

## 2011-11-27 ENCOUNTER — Ambulatory Visit (INDEPENDENT_AMBULATORY_CARE_PROVIDER_SITE_OTHER): Payer: 59 | Admitting: Family Medicine

## 2011-11-27 ENCOUNTER — Encounter: Payer: Self-pay | Admitting: Family Medicine

## 2011-11-27 VITALS — BP 146/74 | HR 63 | Resp 18 | Ht 66.0 in | Wt 201.0 lb

## 2011-11-27 DIAGNOSIS — E739 Lactose intolerance, unspecified: Secondary | ICD-10-CM

## 2011-11-27 DIAGNOSIS — E669 Obesity, unspecified: Secondary | ICD-10-CM

## 2011-11-27 DIAGNOSIS — R42 Dizziness and giddiness: Secondary | ICD-10-CM | POA: Insufficient documentation

## 2011-11-27 DIAGNOSIS — E785 Hyperlipidemia, unspecified: Secondary | ICD-10-CM

## 2011-11-27 DIAGNOSIS — E039 Hypothyroidism, unspecified: Secondary | ICD-10-CM

## 2011-11-27 NOTE — Assessment & Plan Note (Signed)
Fasting lipid,, hepatic in 4.5 month

## 2011-11-27 NOTE — Patient Instructions (Signed)
F/u in 4.5 month  Call if you need me before  It is important that you exercise regularly at least 45 minutes 5 times a week. If you develop chest pain, have severe difficulty breathing, or feel very tired, stop exercising immediately and seek medical attention   A healthy diet is rich in fruit, vegetables and whole grains. Poultry fish, nuts and beans are a healthy choice for protein rather then red meat. A low sodium diet and drinking 64 ounces of water daily is generally recommended. Oils and sweet should be limited. Carbohydrates especially for those who are diabetic or overweight, should be limited to 30-45 gram per meal. It is important to eat on a regular schedule, at least 3 times daily. Snacks should be primarily fruits, vegetables or nuts.  Blood sugar average has worsened, you can reverse this by lifestyle change  Fasting lipid, hepatic, HBa1C in 4.5 month  Use OTC motion sickness pill for vertigo

## 2011-11-27 NOTE — Assessment & Plan Note (Signed)
Controlled, no change in medication  

## 2011-11-27 NOTE — Assessment & Plan Note (Signed)
Deteriorated, pt to get 1500 cal diet sheet, and inc exercise , rept HBA1C in 4.5 month

## 2011-12-02 NOTE — Assessment & Plan Note (Signed)
Deteriorated. Patient re-educated about  the importance of commitment to a  minimum of 150 minutes of exercise per week. The importance of healthy food choices with portion control discussed. Encouraged to start a food diary, count calories and to consider  joining a support group. Sample diet sheets offered. Goals set by the patient for the next several months.    

## 2011-12-02 NOTE — Progress Notes (Signed)
  Subjective:    Patient ID: Deanna Bush, female    DOB: 21-Jan-1961, 51 y.o.   MRN: [id number]  HPI The PT is here for follow up and re-evaluation of chronic medical conditions, medication management and review of any available recent lab and radiology data.  Preventive health is updated, specifically  Cancer screening and Immunization.   Questions or concerns regarding consultations or procedures which the PT has had in the interim are  addressed. The PT denies any adverse reactions to current medications since the last visit.  2 day h/o mild vertigo, generally responds to OTC medication, denies any recent viral illness. Not exercising as much as in the past, and has gained weight,blood sugars , unfortunately woprsened also    Review of Systems See HPI Denies recent fever or chills. Denies sinus pressure, nasal congestion, ear pain or sore throat. Denies chest congestion, productive cough or wheezing. Denies chest pains, palpitations and leg swelling Denies abdominal pain, nausea, vomiting,diarrhea or constipation.   Denies dysuria, frequency, hesitancy or incontinence. Denies joint pain, swelling and limitation in mobility. Denies headaches, seizures, numbness, or tingling. Denies depression, anxiety or insomnia. Denies skin break down or rash.        Objective:   Physical Exam   Patient alert and oriented and in no cardiopulmonary distress.  HEENT: No facial asymmetry, EOMI, no sinus tenderness,  oropharynx pink and moist.  Neck supple no adenopathy.  Chest: Clear to auscultation bilaterally.  CVS: S1, S2 no murmurs, no S3.  ABD: Soft non tender. Bowel sounds normal.  Ext: No edema  MS: Adequate ROM spine, shoulders, hips and knees.  Skin: Intact, no ulcerations or rash noted.  Psych: Good eye contact, normal affect. Memory intact not anxious or depressed appearing.  CNS: CN 2-12 intact, power, tone and sensation normal throughout.      Assessment & Plan:

## 2012-03-11 ENCOUNTER — Ambulatory Visit: Payer: 59 | Admitting: Family Medicine

## 2012-03-11 ENCOUNTER — Ambulatory Visit (INDEPENDENT_AMBULATORY_CARE_PROVIDER_SITE_OTHER): Payer: 59 | Admitting: Family Medicine

## 2012-03-11 ENCOUNTER — Encounter: Payer: Self-pay | Admitting: Family Medicine

## 2012-03-11 ENCOUNTER — Other Ambulatory Visit: Payer: Self-pay | Admitting: Family Medicine

## 2012-03-11 VITALS — BP 120/78 | HR 71 | Resp 16 | Ht 66.0 in | Wt 197.0 lb

## 2012-03-11 DIAGNOSIS — R7303 Prediabetes: Secondary | ICD-10-CM

## 2012-03-11 DIAGNOSIS — E669 Obesity, unspecified: Secondary | ICD-10-CM

## 2012-03-11 DIAGNOSIS — L03119 Cellulitis of unspecified part of limb: Secondary | ICD-10-CM | POA: Insufficient documentation

## 2012-03-11 DIAGNOSIS — R5381 Other malaise: Secondary | ICD-10-CM

## 2012-03-11 DIAGNOSIS — E785 Hyperlipidemia, unspecified: Secondary | ICD-10-CM

## 2012-03-11 DIAGNOSIS — R7309 Other abnormal glucose: Secondary | ICD-10-CM

## 2012-03-11 DIAGNOSIS — E039 Hypothyroidism, unspecified: Secondary | ICD-10-CM

## 2012-03-11 DIAGNOSIS — L02419 Cutaneous abscess of limb, unspecified: Secondary | ICD-10-CM

## 2012-03-11 MED ORDER — FLUCONAZOLE 150 MG PO TABS: ORAL_TABLET | ORAL | Status: AC

## 2012-03-11 MED ORDER — CEFTRIAXONE SODIUM 1 G IJ SOLR
500.0000 mg | Freq: Once | INTRAMUSCULAR | Status: AC
  Administered 2012-03-11: 500 mg via INTRAMUSCULAR

## 2012-03-11 MED ORDER — DOXYCYCLINE HYCLATE 100 MG PO TABS: 100.0000 mg | ORAL_TABLET | Freq: Two times a day (BID) | ORAL | Status: AC

## 2012-03-11 NOTE — Progress Notes (Signed)
  Subjective:    Patient ID: Deanna Bush, female    DOB: 1961/07/17, 51 y.o.   MRN: [id number]  HPI 4 days ago boil on left buttock which has gone away, 2 day h/o red area on right thigh with pus, h/o recurrent MRSA, clinically looks like MRSA. The PT is also  here for follow up and re-evaluation of chronic medical conditions, medication management and review of any available recent lab and radiology data.  Preventive health is updated, specifically  Cancer screening and Immunization.   Questions or concerns regarding consultations or procedures which the PT has had in the interim are  addressed. The PT denies any adverse reactions to current medications since the last visit.      Review of Systems See HPI Denies recent fever or chills. Denies sinus pressure, nasal congestion, ear pain or sore throat. Denies chest congestion, productive cough or wheezing. Denies chest pains, palpitations and leg swelling Denies abdominal pain, nausea, vomiting,diarrhea or constipation.   Denies dysuria, frequency, hesitancy or incontinence. Denies joint pain, swelling and limitation in mobility. Denies headaches, seizures, numbness, or tingling. Denies depression, anxiety or insomnia.       Objective:   Physical Exam Patient alert and oriented and in no cardiopulmonary distress.  HEENT: No facial asymmetry, EOMI, no sinus tenderness,  oropharynx pink and moist.  Neck supple no adenopathy.  Chest: Clear to auscultation bilaterally.  CVS: S1, S2 no murmurs, no S3.  ABD: Soft non tender. Bowel sounds normal.  Ext: No edema  MS: Adequate ROM spine, shoulders, hips and knees.  Skin:erythema and pustule on right thigh, maximum diameter approc 6cm  Psych: Good eye contact, normal affect. Memory intact not anxious or depressed appearing.  CNS: CN 2-12 intact, power, tone and sensation normal throughout.        Assessment & Plan:

## 2012-03-11 NOTE — Patient Instructions (Signed)
CPE October 30 or after.  CBC, fasting lipid, cmp, HBA1C, TSH in October about 5 days before appointment  It is important that you exercise regularly at least 30 minutes 5 times a week. If you develop chest pain, have severe difficulty breathing, or feel very tired, stop exercising immediately and seek medical attention    A healthy diet is rich in fruit, vegetables and whole grains. Poultry fish, nuts and beans are a healthy choice for protein rather then red meat. A low sodium diet and drinking 64 ounces of water daily is generally recommended. Oils and sweet should be limited. Carbohydrates especially for those who are diabetic or overweight, should be limited to 34-45 gram per meal. It is important to eat on a regular schedule, at least 3 times daily. Snacks should be primarily fruits, vegetables or nuts.   Weight loss goal of 2 to 3 pounds per month  You will get Rocephin in office today for skin infection , and antibiotic and fluconazole are sent to your pharmacy

## 2012-03-12 LAB — HEPATIC FUNCTION PANEL
ALT: 8 U/L (ref 0–35)
AST: 12 U/L (ref 0–37)
Albumin: 4.1 g/dL (ref 3.5–5.2)
Alkaline Phosphatase: 80 U/L (ref 39–117)
Total Protein: 7 g/dL (ref 6.0–8.3)

## 2012-03-12 LAB — LIPID PANEL
Cholesterol: 216 mg/dL — ABNORMAL HIGH (ref 0–200)
HDL: 51 mg/dL (ref >39)
Total CHOL/HDL Ratio: 4.2 Ratio
Triglycerides: 53 mg/dL (ref <150)

## 2012-03-12 LAB — HEMOGLOBIN A1C: Hgb A1c MFr Bld: 5.8 % — ABNORMAL HIGH (ref <5.7)

## 2012-03-12 LAB — TSH: TSH: 1.285 u[IU]/mL (ref 0.350–4.500)

## 2012-03-15 NOTE — Assessment & Plan Note (Signed)
Uncontrolled, no med change, dietary chane only, also states hse has been out of med for a short while

## 2012-03-15 NOTE — Assessment & Plan Note (Addendum)
Improved. Pt applauded on succesful weight loss through lifestyle change, and encouraged to continue same. Weight loss goal set for the next several months.  

## 2012-03-15 NOTE — Assessment & Plan Note (Signed)
Improved, pt to continue attention to healthy lifestyle as far as diet and exercise are concerned

## 2012-03-15 NOTE — Assessment & Plan Note (Signed)
Controlled, no change in medication  

## 2012-03-15 NOTE — Assessment & Plan Note (Signed)
Acute infection , h/o recurrent skin infection at time MRSA positive, will treat as if positive, looks like MRSA

## 2012-04-01 ENCOUNTER — Other Ambulatory Visit: Payer: Self-pay | Admitting: Family Medicine

## 2012-04-15 ENCOUNTER — Ambulatory Visit: Payer: 59 | Admitting: Family Medicine

## 2012-06-03 ENCOUNTER — Other Ambulatory Visit: Payer: Self-pay | Admitting: Family Medicine

## 2012-06-03 ENCOUNTER — Telehealth: Payer: Self-pay

## 2012-06-03 MED ORDER — DOXYCYCLINE HYCLATE 100 MG PO TABS: 100.0000 mg | ORAL_TABLET | Freq: Two times a day (BID) | ORAL | Status: AC

## 2012-06-03 NOTE — Telephone Encounter (Signed)
Doxycycline x 10 days sent to pharmacy, and she needs to come in for appt next week for follow up, needs to sched, also advise go to Ed if worse

## 2012-06-03 NOTE — Telephone Encounter (Signed)
Patient aware and will call back to schedule appointment for next week.

## 2012-06-03 NOTE — Telephone Encounter (Signed)
Pt states that she has a boil on both legs. In the muscle part of her leg above ankle on both. Tried to get appt lastweek but none available. Luann tried to put her in Friday but that is her 12 hour shift and can't come. States there has been pus coming out and she has been using antibiotic cream and covering it with a bandage but its still bothering her. Wants to know if she can get something called in for it

## 2012-07-20 ENCOUNTER — Other Ambulatory Visit: Payer: Self-pay | Admitting: Family Medicine

## 2012-07-20 DIAGNOSIS — IMO0001 Reserved for inherently not codable concepts without codable children: Secondary | ICD-10-CM

## 2012-07-30 ENCOUNTER — Other Ambulatory Visit: Payer: Self-pay | Admitting: Family Medicine

## 2012-07-30 ENCOUNTER — Ambulatory Visit (HOSPITAL_COMMUNITY)
Admission: RE | Admit: 2012-07-30 | Discharge: 2012-07-30 | Disposition: A | Discharge status: Home / Self Care | Payer: 59 | Source: Ambulatory Visit | Attending: Family Medicine | Admitting: Family Medicine

## 2012-07-30 DIAGNOSIS — Z1231 Encounter for screening mammogram for malignant neoplasm of breast: Secondary | ICD-10-CM | POA: Insufficient documentation

## 2012-07-30 DIAGNOSIS — IMO0001 Reserved for inherently not codable concepts without codable children: Secondary | ICD-10-CM

## 2012-07-31 LAB — COMPREHENSIVE METABOLIC PANEL
ALT: <8 U/L (ref 0–35)
Alkaline Phosphatase: 72 U/L (ref 39–117)
CO2: 31 mEq/L (ref 19–32)
Sodium: 140 mEq/L (ref 135–145)
Total Bilirubin: 0.5 mg/dL (ref 0.3–1.2)
Total Protein: 7.2 g/dL (ref 6.0–8.3)

## 2012-07-31 LAB — LIPID PANEL
HDL: 49 mg/dL (ref >39)
LDL Cholesterol: 169 mg/dL — ABNORMAL HIGH (ref 0–99)
Total CHOL/HDL Ratio: 4.8 Ratio
Triglycerides: 90 mg/dL (ref <150)

## 2012-07-31 LAB — CBC
MCH: 28.9 pg (ref 26.0–34.0)
Platelets: 311 10*3/uL (ref 150–400)
RBC: 4.09 MIL/uL (ref 3.87–5.11)

## 2012-07-31 LAB — TSH: TSH: 0.926 u[IU]/mL (ref 0.350–4.500)

## 2012-08-06 ENCOUNTER — Encounter: Payer: Self-pay | Admitting: Family Medicine

## 2012-08-06 ENCOUNTER — Other Ambulatory Visit (HOSPITAL_COMMUNITY)
Admission: RE | Admit: 2012-08-06 | Discharge: 2012-08-06 | Disposition: A | Discharge status: Home / Self Care | Payer: 59 | Source: Ambulatory Visit | Attending: Family Medicine | Admitting: Family Medicine

## 2012-08-06 ENCOUNTER — Ambulatory Visit (INDEPENDENT_AMBULATORY_CARE_PROVIDER_SITE_OTHER): Payer: 59 | Admitting: Family Medicine

## 2012-08-06 VITALS — BP 140/74 | HR 76 | Resp 18 | Ht 66.0 in | Wt 196.0 lb

## 2012-08-06 DIAGNOSIS — R7309 Other abnormal glucose: Secondary | ICD-10-CM

## 2012-08-06 DIAGNOSIS — Z1211 Encounter for screening for malignant neoplasm of colon: Secondary | ICD-10-CM

## 2012-08-06 DIAGNOSIS — E039 Hypothyroidism, unspecified: Secondary | ICD-10-CM

## 2012-08-06 DIAGNOSIS — M25562 Pain in left knee: Secondary | ICD-10-CM

## 2012-08-06 DIAGNOSIS — R7303 Prediabetes: Secondary | ICD-10-CM

## 2012-08-06 DIAGNOSIS — E785 Hyperlipidemia, unspecified: Secondary | ICD-10-CM

## 2012-08-06 DIAGNOSIS — Z01419 Encounter for gynecological examination (general) (routine) without abnormal findings: Secondary | ICD-10-CM | POA: Insufficient documentation

## 2012-08-06 DIAGNOSIS — E669 Obesity, unspecified: Secondary | ICD-10-CM

## 2012-08-06 DIAGNOSIS — M25569 Pain in unspecified knee: Secondary | ICD-10-CM

## 2012-08-06 DIAGNOSIS — Z0000 Encounter for general adult medical examination without abnormal findings: Secondary | ICD-10-CM | POA: Insufficient documentation

## 2012-08-06 DIAGNOSIS — Z1151 Encounter for screening for human papillomavirus (HPV): Secondary | ICD-10-CM | POA: Insufficient documentation

## 2012-08-06 DIAGNOSIS — Z23 Encounter for immunization: Secondary | ICD-10-CM

## 2012-08-06 LAB — POC HEMOCCULT BLD/STL (OFFICE/1-CARD/DIAGNOSTIC): Fecal Occult Blood, POC: NEGATIVE

## 2012-08-06 MED ORDER — ATORVASTATIN CALCIUM 10 MG PO TABS: 10.0000 mg | ORAL_TABLET | Freq: Every day | ORAL | Status: DC

## 2012-08-06 NOTE — Progress Notes (Signed)
  Subjective:    Patient ID: Deanna Bush, female    DOB: November 25, 1960, 51 y.o.   MRN: [id number]  HPI  The PT is here for annual exam and re-evaluation of chronic medical conditions, medication management and review of any available recent lab and radiology data.  Preventive health is updated, specifically  Cancer screening and Immunization.   Questions or concerns regarding consultations or procedures which the PT has had in the interim are  addressed. The PT denies any adverse reactions to current medications since the last visit.  C/o new onset left knee pain  with swelling and buckling, preventing her from walking     Review of Systems See HPI Denies recent fever or chills. Denies sinus pressure, nasal congestion, ear pain or sore throat. Denies chest congestion, productive cough or wheezing. Denies chest pains, palpitations and leg swelling Denies abdominal pain, nausea, vomiting,diarrhea or constipation.   Denies dysuria, frequency, hesitancy or incontinence.  Denies headaches, seizures, numbness, or tingling. Denies depression, anxiety or insomnia. Denies skin break down or rash.        Objective:   Physical Exam Pleasant well nourished female, alert and oriented x 3, in no cardio-pulmonary distress. Afebrile. HEENT No facial trauma or asymetry. Sinuses non tender.  EOMI, PERTL, fundoscopic exam is normal, no hemorhage or exudate.  External ears normal, tympanic membranes clear. Oropharynx moist, no exudate, fair dentition. Neck: supple, no adenopathy,JVD or thyromegaly.No bruits.  Chest: Clear to ascultation bilaterally.No crackles or wheezes. Non tender to palpation  Breast: ,no masses. No nipple discharge or inversion.Left mastectomy No axillary or supraclavicular adenopathy  Cardiovascular system; Heart sounds normal,  S1 and  S2 ,no S3.  No murmur, or thrill. Apical beat not displaced Peripheral pulses normal.  Abdomen: Soft, non tender, no  organomegaly or masses. No bruits. Bowel sounds normal. No guarding, tenderness or rebound.  Rectal:  No mass. Guaiac negative stool.  GU: External genitalia normal. No lesions. Vaginal canal normal.No discharge. Uterus normal size, no adnexal masses, no cervical motion or adnexal tenderness.  Musculoskeletal exam: Full ROM of spine, hips , shoulders  And reduced in left knee, which is tender and swollen with crepitus  No muscle wasting or atrophy.   Neurologic: Cranial nerves 2 to 12 intact. Power, tone ,sensation and reflexes normal throughout. No disturbance in gait. No tremor.  Skin: Intact, no ulceration, erythema , scaling or rash noted. Pigmentation normal throughout  Psych; Normal mood and affect. Judgement and concentration normal        Assessment & Plan:

## 2012-08-06 NOTE — Patient Instructions (Addendum)
F/U in 4.5 month  Fasting lipid, cmp , TSH and HBA1C in 4.5 month  Flu vaccine today.  You are referred to Dr Romeo Apple for eval of left knee pain  It is important that you exercise regularly at least 30 minutes 5 times a week. If you develop chest pain, have severe difficulty breathing, or feel very tired, stop exercising immediately and seek medical attention    A healthy diet is rich in fruit, vegetables and whole grains. Poultry fish, nuts and beans are a healthy choice for protein rather then red meat. A low sodium diet and drinking 64 ounces of water daily is generally recommended. Oils and sweet should be limited. Carbohydrates especially for those who are diabetic or overweight, should be limited to 34-45 gram per meal. It is important to eat on a regular schedule, at least 3 times daily. Snacks should be primarily fruits, vegetables or nuts.   Cholesterol is high, sart low dose lipitor, 10mg  one at bedtime and follow a low fat diet

## 2012-08-09 NOTE — Assessment & Plan Note (Signed)
Uncontrolled, start low dose lipitor Hyperlipidemia:Low fat diet discussed and encouraged.

## 2012-08-09 NOTE — Assessment & Plan Note (Signed)
Controlled, no change in medication  

## 2012-08-09 NOTE — Assessment & Plan Note (Signed)
Deteriorated. Patient re-educated about  the importance of commitment to a  minimum of 150 minutes of exercise per week. The importance of healthy food choices with portion control discussed. Encouraged to start a food diary, count calories and to consider  joining a support group. Sample diet sheets offered. Goals set by the patient for the next several months.    

## 2012-08-09 NOTE — Assessment & Plan Note (Signed)
New onset and disabling refer to ortho

## 2012-08-09 NOTE — Assessment & Plan Note (Signed)
Annual exam completed and documented. Pt counseled re the need for dietary change to improve health, rich in fruit and veg, low in carbs and concentrated sweet Also need for daily physical activity when able with weight loss

## 2012-08-12 ENCOUNTER — Encounter (HOSPITAL_COMMUNITY): Payer: Self-pay | Admitting: Oncology

## 2012-08-12 ENCOUNTER — Encounter (HOSPITAL_COMMUNITY): Payer: 59 | Attending: Oncology | Admitting: Oncology

## 2012-08-12 VITALS — BP 113/62 | HR 65 | Resp 16 | Wt 196.8 lb

## 2012-08-12 DIAGNOSIS — C50919 Malignant neoplasm of unspecified site of unspecified female breast: Secondary | ICD-10-CM

## 2012-08-12 DIAGNOSIS — Z853 Personal history of malignant neoplasm of breast: Secondary | ICD-10-CM

## 2012-08-12 NOTE — Progress Notes (Signed)
[name], MD [address], [address] [place] [address]  1. ADENOCARCINOMA, BREAST, LEFT     CURRENT THERAPY: Observation and yearly mammogram for surveillance.  INTERVAL HISTORY: Deanna Bush 51 y.o. female returns for  regular  visit for followup of Stage I tubular adenocarcinoma breast cancer.  S/P left modified radical mastectomy on 06/11/2002  I personally reviewed and went over radiographic studies with the patient.  Mammogram was BI-RADS category 1. This was performed in October 2013.  To be due for her next one in October 2014.  Laboratory work is reviewed. This was performed by her primary care physician, but hematologically her lab work looks stable.  I personally reviewed and went over laboratory results with the patient.  Deedee denies any complaints. She reports she is doing very well. She is working with her primary care physician to help lose some weight. Otherwise, she has no issues that are ongoing.  Complete ROS questioning is negative.  Past Medical History  Diagnosis Date  . Adenocarcinoma of breast     left   . Hyperlipidemia   . Hypothyroidism   . Cellulitis of leg, left   . Family history of breast cancer 08/16/2011  . MRSA (methicillin resistant staph aureus) culture positive 08/19/2011    has ADENOCARCINOMA, BREAST, LEFT; HYPOTHYROIDISM; UNSPECIFIED VITAMIN D DEFICIENCY; Prediabetes; HYPERLIPIDEMIA; OBESITY, UNSPECIFIED; ANEMIA; FATIGUE; Family history of breast cancer; MRSA (methicillin resistant staph aureus) culture positive; Vertigo; Cellulitis and abscess of leg; Knee pain, left; and Routine general medical examination at a health care facility on her problem list.     is allergic to sulfonamide derivatives.  Ms. Whittinghill does not currently have medications on file.  Past Surgical History  Procedure Date  . Left mastectomy   . Cesarean section     x2    Denies any headaches, dizziness, double vision, fevers, chills, night sweats, nausea,  vomiting, diarrhea, constipation, chest pain, heart palpitations, shortness of breath, blood in stool, black tarry stool, urinary pain, urinary burning, urinary frequency, hematuria.   PHYSICAL EXAMINATION  ECOG PERFORMANCE STATUS: 0 - Asymptomatic  Filed Vitals:   08/12/12 1000  BP: 113/62  Pulse: 65  Resp: 16    GENERAL:alert, no distress, well nourished, well developed, comfortable, cooperative and smiling  SKIN: positive for: left lateral pustule inferior to the patella. This pustule measures 0.5 cm. There is surrounding erythema measuring 2 cm. She denies any pain on palpation of the erythematous area, but does admit to tenderness when contact is made with the pustule.  HEAD: Normocephalic  EYES: normal  EARS: External ears normal  OROPHARYNX:mucous membranes are moist  NECK: supple, no adenopathy, no bruits, thyroid normal size, non-tender, without nodularity, no stridor, non-tender, trachea midline  LYMPH: no palpable lymphadenopathy, no hepatosplenomegaly  BREAST:right breast normal without mass, skin or nipple changes or axillary nodes, left post-mastectomy site well healed and free of suspicious changes  LUNGS: clear to auscultation and percussion  HEART: regular rate & rhythm, no murmurs, no gallops, S1 normal and S2 normal  ABDOMEN:abdomen soft, non-tender, normal bowel sounds and no hepatosplenomegaly  BACK: Back symmetric, no curvature.  EXTREMITIES:less then 2 second capillary refill, no joint deformities, effusion, or inflammation, no edema, no skin discoloration, no clubbing, no cyanosis  NEURO: alert & oriented x 3 with fluent speech, no focal motor/sensory deficits, gait normal     LABORATORY DATA: CBC    Component Value Date/Time   WBC 3.6* 07/30/2012 0710   RBC 4.09 07/30/2012 0710  HGB 11.8* 07/30/2012 0710   HCT 36.4 07/30/2012 0710   PLT 311 07/30/2012 0710   MCV 89.0 07/30/2012 0710   MCH 28.9 07/30/2012 0710   MCHC 32.4 07/30/2012 0710   RDW 14.2  07/30/2012 0710   LYMPHSABS 2.0 07/24/2011 0925   MONOABS 0.3 07/24/2011 0925   EOSABS 0.1 07/24/2011 0925   BASOSABS 0.0 07/24/2011 0925      Chemistry      Component Value Date/Time   NA 140 07/30/2012 0710   K 4.2 07/30/2012 0710   CL 104 07/30/2012 0710   CO2 31 07/30/2012 0710   BUN 18 07/30/2012 0710   CREATININE 0.78 07/30/2012 0710   CREATININE 0.74 08/09/2010 1058      Component Value Date/Time   CALCIUM 9.7 07/30/2012 0710   ALKPHOS 72 07/30/2012 0710   AST 12 07/30/2012 0710   ALT <8 07/30/2012 0710   BILITOT 0.5 07/30/2012 0710         RADIOGRAPHIC STUDIES:  07/30/2012  *RADIOLOGY REPORT*  Clinical Data: Screening.  MAMMOGRAPHIC UNILATERAL RIGHT DIGITAL SCREENING WITH CAD  Comparison: Previous exams.  Findings: There are scattered fibroglandular densities. No  suspicious masses, architectural distortion, or calcifications are  present.  Images were processed with CAD.  IMPRESSION:  No mammographic evidence of malignancy.  A result letter of this screening mammogram will be mailed directly  to the patient.  RECOMMENDATION:  Screening mammogram in one year. (Code:SM-B-01Y)  BI-RADS CATEGORY 1: Negative.  Original Report Authenticated By: Littie Deeds. ARCEO, M.D.     ASSESSMENT:  1. 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 Patient declined hormonal therapy.  2. Family history of breast cancer    PLAN:  1. I personally reviewed and went over laboratory results with the patient. 2. I personally reviewed and went over radiographic studies with the patient. 3. Continue with yearly mammogram. 4. Follow-up with PCP as directed. 5. Patient is released from the clinic.  She understands that we are available to her if she were to develop any hematologic or oncologic issues.    All questions were  answered. The patient knows to call the clinic with any problems, questions or concerns. We can certainly see the patient much sooner if necessary.  The patient and plan discussed with Glenford Peers, MD and he is in agreement with the aforementioned.  KEFALAS,THOMAS

## 2012-08-12 NOTE — Patient Instructions (Addendum)
[facility]  Discharge Instructions  RECOMMENDATIONS MADE BY THE CONSULTANT AND ANY TEST RESULTS WILL BE SENT TO YOUR REFERRING DOCTOR.   EXAM FINDINGS BY MD TODAY AND SIGNS AND SYMPTOMS TO REPORT TO CLINIC OR PRIMARY MD: Exam and discussion by PA.  You are doing well and you are 10 years out from diagnosis and we will release you from the clinic.  Call us with any new lumps, bone pain or shortness of breath.  Continue with yearly mammograms.  MEDICATIONS PRESCRIBED: none    SPECIAL INSTRUCTIONS/FOLLOW-UP: Released from clinic.   I acknowledge that I have been informed and understand all the instructions given to me and received a copy. I do not have any more questions at this time, but understand that I may call the Specialty Clinic at [facility] at [phone] during business hours should I have any further questions or need assistance in obtaining follow-up care.    __________________________________________  _____________  __________ Signature of Patient or Authorized Representative            Date                   Time    __________________________________________ Nurse's Signature

## 2012-08-14 ENCOUNTER — Ambulatory Visit (HOSPITAL_COMMUNITY): Payer: 59 | Admitting: Oncology

## 2012-08-26 ENCOUNTER — Ambulatory Visit (INDEPENDENT_AMBULATORY_CARE_PROVIDER_SITE_OTHER): Payer: 59

## 2012-08-26 ENCOUNTER — Ambulatory Visit (INDEPENDENT_AMBULATORY_CARE_PROVIDER_SITE_OTHER): Payer: 59 | Admitting: Orthopedic Surgery

## 2012-08-26 ENCOUNTER — Encounter: Payer: Self-pay | Admitting: Orthopedic Surgery

## 2012-08-26 VITALS — Ht 66.0 in | Wt 196.0 lb

## 2012-08-26 DIAGNOSIS — M25569 Pain in unspecified knee: Secondary | ICD-10-CM

## 2012-08-26 DIAGNOSIS — M25462 Effusion, left knee: Secondary | ICD-10-CM | POA: Insufficient documentation

## 2012-08-26 DIAGNOSIS — M25469 Effusion, unspecified knee: Secondary | ICD-10-CM

## 2012-08-26 DIAGNOSIS — M1712 Unilateral primary osteoarthritis, left knee: Secondary | ICD-10-CM

## 2012-08-26 DIAGNOSIS — M1710 Unilateral primary osteoarthritis, unspecified knee: Secondary | ICD-10-CM

## 2012-08-26 NOTE — Patient Instructions (Signed)
You have received a steroid shot. 15% of patients experience increased pain at the injection site with in the next 24 hours. This is best treated with ice and tylenol extra strength 2 tabs every 8 hours. If you are still having pain please call the office.   Ice the knee daily   Avoid strenuous activity x 10 days then resume walking   Take an over the counter anti inflammatory aleve or advil for a week

## 2012-08-26 NOTE — Progress Notes (Signed)
Patient ID: Deanna Bush, female   DOB: [birth year], 51 y.o.   MRN: [id number] Chief Complaint  Patient presents with  . Knee Pain    Left knee pain, no injury.      51 year old female who enjoys walking presents with a one-month to 6 week history of swelling in the left knee which came on gradually progressed rapidly to include 3/10 intermittent pain which is worse after exercise and associated with swelling of the left knee no other trauma  Review of systems negative except for seasonal allergy  Medical history recorded and reviewed  Ht 5\' 6"  (1.676 m)  Wt 196 lb (88.905 kg)  BMI 31.64 kg/m2 Vital signs are stable as recorded  General appearance is normal  The patient is alert and oriented x3  [name] mood and affect are normal  Gait assessment: Slight limp The cardiovascular exam reveals normal pulses and temperature without edema swelling.  The lymphatic system is negative for palpable lymph nodes  The sensory exam is normal.  There are no pathologic reflexes.  Balance is normal.   Exam of the left knee inspection reveals effusion range of motion is normal passive and active. All ligaments are stable. Muscle tone is normal. Skin is intact without rash.  X-rays show moderate arthritis  1. Knee pain  DG Knee AP/LAT W/Sunrise Left  2. Osteoarthritis of left knee    3. Effusion of knee joint, left      Plan aspiration injection left knee activity modification anti-inflammatories ice. Call office if effusion recurs for re\re aspiration injection  Knee  Injection and aspiration Procedure Note  Pre-operative Diagnosis: left knee oa, effusion  Post-operative Diagnosis: same  Indications: pain, swelling  Anesthesia: ethyl chloride   Procedure Details   Verbal consent was obtained for the procedure. Time out was completed.The joint was prepped with alcohol, followed by  Ethyl chloride spray and The 18-gauge needle was inserted into the joint via lateral  approach and we aspirated approximately 30cc of clear yellow fluid  This was followed by the injection of 4ml 1% lidocaine and 1 ml of depomedrol  was then injected into the joint . The needle was removed and the area cleansed and dressed.  Complications:  None; patient tolerated the procedure well.

## 2012-12-16 ENCOUNTER — Ambulatory Visit: Payer: 59 | Admitting: Family Medicine

## 2012-12-16 ENCOUNTER — Other Ambulatory Visit: Payer: Self-pay | Admitting: Family Medicine

## 2012-12-17 ENCOUNTER — Ambulatory Visit: Payer: 59 | Admitting: Family Medicine

## 2012-12-17 LAB — COMPREHENSIVE METABOLIC PANEL
Albumin: 4.3 g/dL (ref 3.5–5.2)
Alkaline Phosphatase: 65 U/L (ref 39–117)
Glucose, Bld: 100 mg/dL — ABNORMAL HIGH (ref 70–99)
Potassium: 4.4 mEq/L (ref 3.5–5.3)
Sodium: 141 mEq/L (ref 135–145)
Total Protein: 7.1 g/dL (ref 6.0–8.3)

## 2012-12-17 LAB — LIPID PANEL
LDL Cholesterol: 109 mg/dL — ABNORMAL HIGH (ref 0–99)
Triglycerides: 59 mg/dL (ref <150)

## 2012-12-17 LAB — HEMOGLOBIN A1C
Hgb A1c MFr Bld: 6.3 % — ABNORMAL HIGH (ref <5.7)
Mean Plasma Glucose: 134 mg/dL — ABNORMAL HIGH (ref <117)

## 2012-12-17 LAB — TSH: TSH: 0.256 u[IU]/mL — ABNORMAL LOW (ref 0.350–4.500)

## 2012-12-23 ENCOUNTER — Ambulatory Visit (INDEPENDENT_AMBULATORY_CARE_PROVIDER_SITE_OTHER): Payer: 59 | Admitting: Family Medicine

## 2012-12-23 VITALS — BP 120/78 | HR 66 | Resp 18 | Ht 66.0 in | Wt 194.1 lb

## 2012-12-23 DIAGNOSIS — R7309 Other abnormal glucose: Secondary | ICD-10-CM

## 2012-12-23 DIAGNOSIS — R7303 Prediabetes: Secondary | ICD-10-CM

## 2012-12-23 DIAGNOSIS — J0100 Acute maxillary sinusitis, unspecified: Secondary | ICD-10-CM | POA: Insufficient documentation

## 2012-12-23 DIAGNOSIS — E669 Obesity, unspecified: Secondary | ICD-10-CM

## 2012-12-23 DIAGNOSIS — E039 Hypothyroidism, unspecified: Secondary | ICD-10-CM

## 2012-12-23 DIAGNOSIS — C50919 Malignant neoplasm of unspecified site of unspecified female breast: Secondary | ICD-10-CM

## 2012-12-23 DIAGNOSIS — E785 Hyperlipidemia, unspecified: Secondary | ICD-10-CM

## 2012-12-23 MED ORDER — ATORVASTATIN CALCIUM 10 MG PO TABS: 10.0000 mg | ORAL_TABLET | Freq: Every day | ORAL | Status: DC

## 2012-12-23 MED ORDER — LEVOTHYROXINE SODIUM 100 MCG PO TABS: 100.0000 ug | ORAL_TABLET | Freq: Every day | ORAL | Status: DC

## 2012-12-23 MED ORDER — FLUCONAZOLE 150 MG PO TABS: ORAL_TABLET | ORAL | Status: AC

## 2012-12-23 MED ORDER — LEVOFLOXACIN 500 MG PO TABS: 500.0000 mg | ORAL_TABLET | Freq: Every day | ORAL | Status: AC

## 2012-12-23 MED ORDER — METFORMIN HCL ER 500 MG PO TB24: 500.0000 mg | ORAL_TABLET | Freq: Every day | ORAL | Status: DC

## 2012-12-23 NOTE — Patient Instructions (Addendum)
F/u in 4.5 month, call if you need me before  You are referred for an MRI of your breasts in October when imaging is due, call back to check on this .  Levaquin sent in for acute sinusitis, also fluconazole, in case you get a vaginal yeast infection   Lower dose of thyroid med and rept TSH in end May or early June  Cholesterol much improved, no change in meds   Your blood sugar has increased, start metformin one daily, you are still prediabetic. Commit to daily exercise , weight loss and reduced carb and caloric intake.  You will get info on prediabetes, also the form which has info an the classes which are available at the hospital  cmp and EGFR, HBA1C and TSH in 4.6 5 month, non, fast.

## 2012-12-23 NOTE — Progress Notes (Signed)
  Subjective:    Patient ID: Deanna Bush, female    DOB: [birth year], 52 y.o.   MRN: [id number]  HPI The PT is here for follow up and re-evaluation of chronic medical conditions, medication management and review of any available recent lab and radiology data.  Preventive health is updated, specifically  Cancer screening and Immunization.   Questions or concerns regarding consultations or procedures which the PT has had in the interim are  addressed. The PT denies any adverse reactions to current medications since the last visit.  3 day h/o right maxillary pressure, brown nasal drainage and sputum, right redeye after excess sneezing x 1 day,  Pain and redness in right eye x 2 day no or loss of vision      Review of Systems See HPI  Denies chest congestion, productive cough or wheezing. Denies chest pains, palpitations and leg swelling Denies abdominal pain, nausea, vomiting,diarrhea or constipation.   Denies dysuria, frequency, hesitancy or incontinence. Denies joint pain, swelling and limitation in mobility. Denies headaches, seizures, numbness, or tingling. Denies depression, anxiety or insomnia. Denies skin break down or rash.        Objective:   Physical Exam  Patient alert and oriented and in no cardiopulmonary distress.  HEENT: No facial asymmetry, EOMI, right maxillary  sinus tenderness,  oropharynx pink and moist.  Neck supple no adenopathy.Right conjunctiva erythematous, no drainage noted. TM clear  Chest: Clear to auscultation bilaterally.  CVS: S1, S2 no murmurs, no S3.  ABD: Soft non tender. Bowel sounds normal.  Ext: No edema  MS: Adequate ROM spine, shoulders, hips and knees.  Skin: Intact, no ulcerations or rash noted.  Psych: Good eye contact, normal affect. Memory intact not anxious or depressed appearing.  CNS: CN 2-12 intact, power, tone and sensation normal throughout.       Assessment & Plan:

## 2012-12-27 ENCOUNTER — Encounter: Payer: Self-pay | Admitting: Family Medicine

## 2012-12-27 NOTE — Assessment & Plan Note (Signed)
Controlled, no change in medication  

## 2012-12-27 NOTE — Assessment & Plan Note (Signed)
Deteriorated, start metformin Patient educated about the importance of limiting  Carbohydrate intake , the need to commit to daily physical activity for a minimum of 30 minutes , and to commit weight loss. The fact that changes in all these areas will reduce or eliminate all together the development of diabetes is stressed.

## 2012-12-27 NOTE — Assessment & Plan Note (Signed)
Improved, continue meds, Hyperlipidemia:Low fat diet discussed and encouraged.

## 2012-12-27 NOTE — Assessment & Plan Note (Signed)
Unchanged. Patient re-educated about  the importance of commitment to a  minimum of 150 minutes of exercise per week. The importance of healthy food choices with portion control discussed. Encouraged to start a food diary, count calories and to consider  joining a support group. Sample diet sheets offered. Goals set by the patient for the next several months.    

## 2012-12-27 NOTE — Assessment & Plan Note (Signed)
Antibiotic course prescribed 

## 2013-01-07 ENCOUNTER — Telehealth: Payer: Self-pay | Admitting: Family Medicine

## 2013-01-07 NOTE — Telephone Encounter (Signed)
I had requested it for 08/05/2013, if youi look in the referral box youwill see this , so I am niot sure what I need to do differently

## 2013-01-08 NOTE — Telephone Encounter (Signed)
Breast center cancelled and stated she had to have mammo first and they set up the MRI not  aph. Hope stated she would take care of this patient.

## 2013-01-11 ENCOUNTER — Ambulatory Visit (HOSPITAL_COMMUNITY): Payer: 59

## 2013-04-21 ENCOUNTER — Other Ambulatory Visit: Payer: Self-pay

## 2013-04-21 DIAGNOSIS — E039 Hypothyroidism, unspecified: Secondary | ICD-10-CM

## 2013-04-21 MED ORDER — LEVOTHYROXINE SODIUM 100 MCG PO TABS: 100.0000 ug | ORAL_TABLET | Freq: Every day | ORAL | Status: DC

## 2013-05-12 LAB — COMPLETE METABOLIC PANEL WITH GFR
ALT: 13 U/L (ref 0–35)
AST: 15 U/L (ref 0–37)
Alkaline Phosphatase: 75 U/L (ref 39–117)
Sodium: 140 mEq/L (ref 135–145)
Total Bilirubin: 0.5 mg/dL (ref 0.3–1.2)
Total Protein: 6.9 g/dL (ref 6.0–8.3)

## 2013-05-12 LAB — TSH: TSH: 1.014 u[IU]/mL (ref 0.350–4.500)

## 2013-05-19 ENCOUNTER — Encounter: Payer: Self-pay | Admitting: Family Medicine

## 2013-05-19 ENCOUNTER — Ambulatory Visit (INDEPENDENT_AMBULATORY_CARE_PROVIDER_SITE_OTHER): Payer: 59 | Admitting: Family Medicine

## 2013-05-19 VITALS — BP 120/72 | HR 71 | Resp 16 | Ht 66.0 in | Wt 192.0 lb

## 2013-05-19 DIAGNOSIS — E669 Obesity, unspecified: Secondary | ICD-10-CM

## 2013-05-19 DIAGNOSIS — E039 Hypothyroidism, unspecified: Secondary | ICD-10-CM

## 2013-05-19 DIAGNOSIS — E785 Hyperlipidemia, unspecified: Secondary | ICD-10-CM

## 2013-05-19 DIAGNOSIS — R7303 Prediabetes: Secondary | ICD-10-CM

## 2013-05-19 DIAGNOSIS — R7309 Other abnormal glucose: Secondary | ICD-10-CM

## 2013-05-19 NOTE — Progress Notes (Signed)
  Subjective:    Patient ID: Deanna Bush, female    DOB: 1961-07-28, 52 y.o.   MRN: [id number]  HPI The PT is here for follow up and re-evaluation of chronic medical conditions, medication management and review of any available recent lab and radiology data.  Preventive health is updated, specifically  Cancer screening and Immunization.   Questions or concerns regarding consultations or procedures which the PT has had in the interim are  addressed. The PT denies any adverse reactions to current medications since the last visit.  There are no new concerns.  There are no specific complaints       Review of Systems See HPI Denies recent fever or chills. Denies sinus pressure, nasal congestion, ear pain or sore throat. Denies chest congestion, productive cough or wheezing. Denies chest pains, palpitations and leg swelling Denies abdominal pain, nausea, vomiting,diarrhea or constipation.   Denies dysuria, frequency, hesitancy or incontinence. Denies joint pain, swelling and limitation in mobility. Denies headaches, seizures, numbness, or tingling. Denies depression, anxiety or insomnia. Denies skin break down or rash.        Objective:   Physical Exam  Patient alert and oriented and in no cardiopulmonary distress.  HEENT: No facial asymmetry, EOMI, no sinus tenderness,  oropharynx pink and moist.  Neck supple no adenopathy.  Chest: Clear to auscultation bilaterally.  CVS: S1, S2 no murmurs, no S3.  ABD: Soft non tender. Bowel sounds normal.  Ext: No edema  MS: Adequate ROM spine, shoulders, hips and knees.  Skin: Intact, no ulcerations or rash noted.  Psych: Good eye contact, normal affect. Memory intact not anxious or depressed appearing.  CNS: CN 2-12 intact, power, tone and sensation normal throughout.       Assessment & Plan:

## 2013-05-19 NOTE — Patient Instructions (Addendum)
F/u in 4 month  Call in October for flu vaccine if you do not get on job  Blood work looks good.  Please reduce portion sizes of carbs and start measuring carb content in meals to help with improving blood sugars and weight loss.  Pls commit to exercise at least 30 minutes every day  Pls start calcium with D 1200mg /1000IU once daily   Weight loss goal of 1.5 to 2   Pounds per month  Fasting lipid, TSH, and HBA1C in 4 month, before visit

## 2013-06-03 NOTE — Assessment & Plan Note (Signed)
Improved, pt applauded on this Patient educated about the importance of limiting  Carbohydrate intake , the need to commit to daily physical activity for a minimum of 30 minutes , and to commit weight loss. The fact that changes in all these areas will reduce or eliminate all together the development of diabetes is stressed.    

## 2013-06-03 NOTE — Assessment & Plan Note (Signed)
Controlled, no change in medication  

## 2013-06-03 NOTE — Assessment & Plan Note (Signed)
Improved. Pt applauded on succesful weight loss through lifestyle change, and encouraged to continue same. Weight loss goal set for the next several months.  

## 2013-07-22 ENCOUNTER — Telehealth: Payer: Self-pay | Admitting: Family Medicine

## 2013-07-22 ENCOUNTER — Other Ambulatory Visit: Payer: Self-pay | Admitting: Family Medicine

## 2013-07-22 DIAGNOSIS — Z9012 Acquired absence of left breast and nipple: Secondary | ICD-10-CM

## 2013-07-22 DIAGNOSIS — Z1239 Encounter for other screening for malignant neoplasm of breast: Secondary | ICD-10-CM

## 2013-07-22 NOTE — Telephone Encounter (Signed)
Right mammogram ordered

## 2013-07-23 ENCOUNTER — Telehealth: Payer: Self-pay | Admitting: Family Medicine

## 2013-07-27 ENCOUNTER — Other Ambulatory Visit: Payer: Self-pay | Admitting: Family Medicine

## 2013-07-27 DIAGNOSIS — Z1239 Encounter for other screening for malignant neoplasm of breast: Secondary | ICD-10-CM

## 2013-07-27 NOTE — Telephone Encounter (Signed)
Left message for patient to call back  

## 2013-07-27 NOTE — Telephone Encounter (Signed)
I entered mri right breast, when you try to sched let me know if you have probs, amy need to discuss with oncology as tyo how to get approved, please

## 2013-08-03 ENCOUNTER — Ambulatory Visit (HOSPITAL_COMMUNITY): Payer: 59

## 2013-08-05 ENCOUNTER — Encounter: Payer: Self-pay | Admitting: Family Medicine

## 2013-08-05 ENCOUNTER — Ambulatory Visit (INDEPENDENT_AMBULATORY_CARE_PROVIDER_SITE_OTHER): Payer: 59 | Admitting: Family Medicine

## 2013-08-05 ENCOUNTER — Other Ambulatory Visit (HOSPITAL_COMMUNITY)
Admission: RE | Admit: 2013-08-05 | Discharge: 2013-08-05 | Disposition: A | Discharge status: Home / Self Care | Payer: 59 | Source: Ambulatory Visit | Attending: Family Medicine | Admitting: Family Medicine

## 2013-08-05 VITALS — BP 122/76 | HR 74 | Resp 18 | Ht 66.0 in | Wt 195.0 lb

## 2013-08-05 DIAGNOSIS — M5430 Sciatica, unspecified side: Secondary | ICD-10-CM

## 2013-08-05 DIAGNOSIS — M5431 Sciatica, right side: Secondary | ICD-10-CM | POA: Insufficient documentation

## 2013-08-05 DIAGNOSIS — E039 Hypothyroidism, unspecified: Secondary | ICD-10-CM

## 2013-08-05 DIAGNOSIS — Z113 Encounter for screening for infections with a predominantly sexual mode of transmission: Secondary | ICD-10-CM | POA: Insufficient documentation

## 2013-08-05 DIAGNOSIS — R7309 Other abnormal glucose: Secondary | ICD-10-CM

## 2013-08-05 DIAGNOSIS — R7303 Prediabetes: Secondary | ICD-10-CM

## 2013-08-05 DIAGNOSIS — N760 Acute vaginitis: Secondary | ICD-10-CM

## 2013-08-05 DIAGNOSIS — N926 Irregular menstruation, unspecified: Secondary | ICD-10-CM

## 2013-08-05 MED ORDER — METHYLPREDNISOLONE ACETATE 80 MG/ML IJ SUSP
80.0000 mg | Freq: Once | INTRAMUSCULAR | Status: AC
  Administered 2013-08-05: 80 mg via INTRAMUSCULAR

## 2013-08-05 MED ORDER — PREDNISONE (PAK) 5 MG PO TABS: 5.0000 mg | ORAL_TABLET | ORAL | Status: DC

## 2013-08-05 MED ORDER — KETOROLAC TROMETHAMINE 60 MG/2ML IM SOLN
60.0000 mg | Freq: Once | INTRAMUSCULAR | Status: AC
  Administered 2013-08-05: 60 mg via INTRAMUSCULAR

## 2013-08-05 MED ORDER — OMEPRAZOLE 20 MG PO CPDR: 20.0000 mg | DELAYED_RELEASE_CAPSULE | Freq: Every day | ORAL | Status: DC

## 2013-08-05 MED ORDER — IBUPROFEN 800 MG PO TABS: ORAL_TABLET | ORAL | Status: AC

## 2013-08-05 NOTE — Progress Notes (Signed)
  Subjective:    Patient ID: Deanna Bush, female    DOB: 12-25-1960, 52 y.o.   MRN: [id number]  HPI Woke with numbness on right outer thigh from hip to knee, no pain, no weakness, , no incontinence of stool or urine No h/o back pain, no antecedent trauma to explain symptoms. Vaginal bleeding started 10/15 through 10/26,light, first since January 2014 Increased vaginal d/c x 4 days, no pain with intercourse, no urinary symptoms , fevr or chills   Review of Systems See HPI Denies recent fever or chills. Denies sinus pressure, nasal congestion, ear pain or sore throat. Denies chest congestion, productive cough or wheezing. Denies chest pains, palpitations and leg swelling Denies abdominal pain, nausea, vomiting,diarrhea or constipation.   Denies dysuria, frequency, hesitancy or incontinence. Denies joint pain, swelling and limitation in mobility. Denies headaches or  seizures. Denies depression, anxiety or insomnia. Denies skin break down or rash.        Objective:   Physical Exam  Patient alert and oriented and in no cardiopulmonary distress.  HEENT: No facial asymmetry, EOMI, no sinus tenderness,  oropharynx pink and moist.  Neck supple no adenopathy.  Chest: Clear to auscultation bilaterally.  CVS: S1, S2 no murmurs, no S3.  ABD: Soft non tender. Bowel sounds normal. Pelvic;malodorous vaginal d/c , no uterine or adnexal tenderness Ext: No edema  MS: Adequate ROM spine, shoulders, hips and knees.  Skin: Intact, no ulcerations or rash noted.  Psych: Good eye contact, normal affect. Memory intact not anxious or depressed appearing.  CNS: CN 2-12 intact, power,  normal throughout.Decreased sensation from right right upper thigh to knee       Assessment & Plan:

## 2013-08-05 NOTE — Patient Instructions (Addendum)
F/u as before   Right thigh numbness is due to irritation of sciatic nerve I believe. Toradol and depo medrol are given in office to be followed by ibuprofen, prednisone and omeprazole for stomach protection for the next 1 week  X ray of your low back is ordered to see how much arthritis is present  You will be referred for a pelvic US due to irregular bleeding, next week Tuesday or Wednesday preferably, call tomorrow pM for referral appt  You will be contacted with the results of your swab and treatment is being held till available

## 2013-08-08 DIAGNOSIS — N760 Acute vaginitis: Secondary | ICD-10-CM | POA: Insufficient documentation

## 2013-08-08 NOTE — Assessment & Plan Note (Signed)
Patient educated about the importance of limiting  Carbohydrate intake , the need to commit to daily physical activity for a minimum of 30 minutes , and to commit weight loss. The fact that changes in all these areas will reduce or eliminate all together the development of diabetes is stressed.   Updated lab next visit 

## 2013-08-08 NOTE — Assessment & Plan Note (Signed)
Acute symptom onset, aggressive anti inflammatory treatment, if improved/resolved , no furhter testing beside xray of lumbar spine

## 2013-08-08 NOTE — Assessment & Plan Note (Signed)
Deanna Bush was barely short of menopause this year when she had a uterine bleed, a in January, recently completed her 2nd flow for this year, needs radiologic assesment of endometrium, referred

## 2013-08-08 NOTE — Assessment & Plan Note (Signed)
Malodorous vaginal d/c specimens sent and pt will be treated based on result

## 2013-08-08 NOTE — Assessment & Plan Note (Signed)
Controlled, no change in medication Updated lab next visit 

## 2013-08-09 ENCOUNTER — Ambulatory Visit (HOSPITAL_COMMUNITY): Payer: 59

## 2013-08-09 ENCOUNTER — Other Ambulatory Visit: Payer: Self-pay | Admitting: Family Medicine

## 2013-08-09 ENCOUNTER — Other Ambulatory Visit (HOSPITAL_COMMUNITY): Payer: 59

## 2013-08-09 ENCOUNTER — Ambulatory Visit (HOSPITAL_COMMUNITY)
Admission: RE | Admit: 2013-08-09 | Discharge: 2013-08-09 | Disposition: A | Discharge status: Home / Self Care | Payer: 59 | Source: Ambulatory Visit | Attending: Family Medicine | Admitting: Family Medicine

## 2013-08-09 DIAGNOSIS — Z853 Personal history of malignant neoplasm of breast: Secondary | ICD-10-CM | POA: Insufficient documentation

## 2013-08-09 DIAGNOSIS — Z9012 Acquired absence of left breast and nipple: Secondary | ICD-10-CM

## 2013-08-09 DIAGNOSIS — Z780 Asymptomatic menopausal state: Secondary | ICD-10-CM | POA: Insufficient documentation

## 2013-08-09 DIAGNOSIS — Z1231 Encounter for screening mammogram for malignant neoplasm of breast: Secondary | ICD-10-CM | POA: Insufficient documentation

## 2013-08-09 DIAGNOSIS — R9389 Abnormal findings on diagnostic imaging of other specified body structures: Secondary | ICD-10-CM

## 2013-08-09 DIAGNOSIS — Z1239 Encounter for other screening for malignant neoplasm of breast: Secondary | ICD-10-CM

## 2013-08-09 DIAGNOSIS — N950 Postmenopausal bleeding: Secondary | ICD-10-CM | POA: Insufficient documentation

## 2013-08-09 DIAGNOSIS — N926 Irregular menstruation, unspecified: Secondary | ICD-10-CM | POA: Insufficient documentation

## 2013-08-09 DIAGNOSIS — N949 Unspecified condition associated with female genital organs and menstrual cycle: Secondary | ICD-10-CM

## 2013-08-10 ENCOUNTER — Other Ambulatory Visit: Payer: Self-pay

## 2013-08-10 MED ORDER — METRONIDAZOLE 500 MG PO TABS: 500.0000 mg | ORAL_TABLET | Freq: Two times a day (BID) | ORAL | Status: AC

## 2013-08-13 ENCOUNTER — Ambulatory Visit (HOSPITAL_COMMUNITY)
Admission: RE | Admit: 2013-08-13 | Discharge: 2013-08-13 | Disposition: A | Discharge status: Home / Self Care | Payer: 59 | Source: Ambulatory Visit | Attending: Family Medicine | Admitting: Family Medicine

## 2013-08-13 DIAGNOSIS — M5431 Sciatica, right side: Secondary | ICD-10-CM

## 2013-08-13 DIAGNOSIS — R209 Unspecified disturbances of skin sensation: Secondary | ICD-10-CM | POA: Insufficient documentation

## 2013-09-10 ENCOUNTER — Encounter (HOSPITAL_COMMUNITY): Payer: Self-pay | Admitting: Emergency Medicine

## 2013-09-10 ENCOUNTER — Other Ambulatory Visit: Payer: Self-pay

## 2013-09-10 ENCOUNTER — Emergency Department (HOSPITAL_COMMUNITY)
Admission: EM | Admit: 2013-09-10 | Discharge: 2013-09-10 | Disposition: A | Discharge status: Home / Self Care | Payer: 59 | Source: Home / Self Care

## 2013-09-10 DIAGNOSIS — R0982 Postnasal drip: Secondary | ICD-10-CM

## 2013-09-10 DIAGNOSIS — R05 Cough: Secondary | ICD-10-CM

## 2013-09-10 DIAGNOSIS — J329 Chronic sinusitis, unspecified: Secondary | ICD-10-CM

## 2013-09-10 DIAGNOSIS — E785 Hyperlipidemia, unspecified: Secondary | ICD-10-CM

## 2013-09-10 DIAGNOSIS — J069 Acute upper respiratory infection, unspecified: Secondary | ICD-10-CM

## 2013-09-10 DIAGNOSIS — E039 Hypothyroidism, unspecified: Secondary | ICD-10-CM

## 2013-09-10 LAB — LIPID PANEL
Cholesterol: 253 mg/dL — ABNORMAL HIGH (ref 0–200)
HDL: 57 mg/dL (ref >39)
Total CHOL/HDL Ratio: 4.4 Ratio
Triglycerides: 63 mg/dL (ref <150)

## 2013-09-10 LAB — HEMOGLOBIN A1C: Hgb A1c MFr Bld: 6.5 % — ABNORMAL HIGH (ref <5.7)

## 2013-09-10 MED ORDER — LEVOTHYROXINE SODIUM 100 MCG PO TABS: 100.0000 ug | ORAL_TABLET | Freq: Every day | ORAL | Status: DC

## 2013-09-10 MED ORDER — ATORVASTATIN CALCIUM 10 MG PO TABS: 10.0000 mg | ORAL_TABLET | Freq: Every day | ORAL | Status: DC

## 2013-09-10 NOTE — ED Notes (Signed)
C/o headache. Productive cough with green/brown sputum. Soreness between shoulder blades from cough. Headache and  Fever last night.  Pt has concerns of pneumonia.  Denies n/v/d.  Mild relief with mucinex.

## 2013-09-10 NOTE — ED Provider Notes (Signed)
Medical screening examination/treatment/procedure(s) were performed by resident physician or non-physician practitioner and as supervising physician I was immediately available for consultation/collaboration.   Barkley Bruns MD.   Linna Hoff, MD 09/10/13 [phone]

## 2013-09-10 NOTE — ED Provider Notes (Signed)
CSN: [id number]     Arrival date & time 09/10/13  8295 History   First MD Initiated Contact with Patient 09/10/13 0945     Chief Complaint  Patient presents with  . URI   (Consider location/radiation/quality/duration/timing/severity/associated sxs/prior Treatment) HPI Comments: Cough and cold for 10 d. Headache developed 2 d ago. Denies fever or dyspnea.   Past Medical History  Diagnosis Date  . Adenocarcinoma of breast     left   . Hyperlipidemia   . Hypothyroidism   . Cellulitis of leg, left   . Family history of breast cancer 08/16/2011  . MRSA (methicillin resistant staph aureus) culture positive 08/19/2011   Past Surgical History  Procedure Laterality Date  . Left mastectomy    . Cesarean section      x2   Family History  Problem Relation Age of Onset  . Hypertension Mother   . Diabetes Mother    History  Substance Use Topics  . Smoking status: Former Smoker    Types: Cigarettes  . Smokeless tobacco: Never Used  . Alcohol Use: No   OB History   Grav Para Term Preterm Abortions TAB SAB Ect Mult Living                 Review of Systems  Constitutional: Positive for activity change. Negative for fever, chills, appetite change and fatigue.  HENT: Positive for congestion, postnasal drip, rhinorrhea and sinus pressure. Negative for ear pain and facial swelling.   Eyes: Negative.   Respiratory: Positive for cough. Negative for chest tightness, shortness of breath and wheezing.   Cardiovascular: Negative.   Gastrointestinal: Negative.   Musculoskeletal: Negative for neck pain and neck stiffness.  Skin: Negative for pallor and rash.  Neurological: Negative.     Allergies  Sulfonamide derivatives  Home Medications   Current Outpatient Rx  Name  Route  Sig  Dispense  Refill  . atorvastatin (LIPITOR) 10 MG tablet   Oral   Take 1 tablet (10 mg total) by mouth daily.   90 tablet   1   . levothyroxine (SYNTHROID) 100 MCG tablet   Oral   Take 1 tablet (100  mcg total) by mouth daily.   90 tablet   1     Dose reduction effective 12/23/2012   . metFORMIN (GLUCOPHAGE XR) 500 MG 24 hr tablet   Oral   Take 1 tablet (500 mg total) by mouth daily with breakfast.   90 tablet   1   . Multiple Vitamin (MULTIVITAMIN) tablet   Oral   Take 1 tablet by mouth daily.           Marland Kitchen omeprazole (PRILOSEC) 20 MG capsule   Oral   Take 1 capsule (20 mg total) by mouth daily.   8 capsule   0   . predniSONE (STERAPRED UNI-PAK) 5 MG TABS tablet   Oral   Take 1 tablet (5 mg total) by mouth as directed.   21 tablet   0    BP 118/65  Pulse 57  Temp(Src) 97.9 F (36.6 C) (Oral)  Resp 16  SpO2 100% Physical Exam  Nursing note and vitals reviewed. Constitutional: She is oriented to person, place, and time. She appears well-developed and well-nourished. No distress.  HENT:  Mouth/Throat: No oropharyngeal exudate.  TM's nl OP with mild erythema and cobblestoning. Clear PND  Eyes: Conjunctivae and EOM are normal.  Neck: Normal range of motion. Neck supple.  Cardiovascular: Normal rate, regular rhythm and normal heart  sounds.   Pulmonary/Chest: Effort normal and breath sounds normal. No respiratory distress. She has no wheezes. She has no rales. She exhibits tenderness.  Good expansion. Good air movement. No adventitious sounds.  Musculoskeletal: Normal range of motion. She exhibits no edema.  Lymphadenopathy:    She has no cervical adenopathy.  Neurological: She is alert and oriented to person, place, and time.  Skin: Skin is warm and dry. No rash noted.  Psychiatric: She has a normal mood and affect.    ED Course  Procedures (including critical care time) Labs Review Labs Reviewed - No data to display Imaging Review No results found.    MDM   1. Rhinosinusitis   2. URI (upper respiratory infection)   3. Cough   4. PND (post-nasal drip)       Alka seltzer cold plus and Robitussin DM Motrin q 6h prn Inc fluids Recheck if worse  or new sx's  Hayden Rasmussen, NP 09/10/13 1022

## 2013-09-22 ENCOUNTER — Ambulatory Visit (INDEPENDENT_AMBULATORY_CARE_PROVIDER_SITE_OTHER): Payer: 59 | Admitting: Family Medicine

## 2013-09-22 ENCOUNTER — Encounter: Payer: Self-pay | Admitting: Family Medicine

## 2013-09-22 VITALS — BP 124/78 | HR 75 | Resp 16 | Ht 66.0 in | Wt 194.0 lb

## 2013-09-22 DIAGNOSIS — R5381 Other malaise: Secondary | ICD-10-CM

## 2013-09-22 DIAGNOSIS — C50919 Malignant neoplasm of unspecified site of unspecified female breast: Secondary | ICD-10-CM

## 2013-09-22 DIAGNOSIS — E039 Hypothyroidism, unspecified: Secondary | ICD-10-CM

## 2013-09-22 DIAGNOSIS — E785 Hyperlipidemia, unspecified: Secondary | ICD-10-CM

## 2013-09-22 DIAGNOSIS — R7303 Prediabetes: Secondary | ICD-10-CM

## 2013-09-22 DIAGNOSIS — E669 Obesity, unspecified: Secondary | ICD-10-CM

## 2013-09-22 DIAGNOSIS — R7309 Other abnormal glucose: Secondary | ICD-10-CM

## 2013-09-22 MED ORDER — METFORMIN HCL ER (OSM) 500 MG PO TB24: 500.0000 mg | ORAL_TABLET | Freq: Every day | ORAL | Status: DC

## 2013-09-22 NOTE — Assessment & Plan Note (Signed)
Unchanged. Patient re-educated about  the importance of commitment to a  minimum of 150 minutes of exercise per week. The importance of healthy food choices with portion control discussed. Encouraged to start a food diary, count calories and to consider  joining a support group. Sample diet sheets offered. Goals set by the patient for the next several months.    

## 2013-09-22 NOTE — Assessment & Plan Note (Signed)
Controlled, no change in medication  

## 2013-09-22 NOTE — Patient Instructions (Signed)
F/u in 4 month, call if you need me before  Start metformin and lipitor every day as prescribed  Change eating and re commit to exercise as discussed   Fasting lipid, cmp and EGFr, HBa1C and tSh in 4 month

## 2013-09-22 NOTE — Assessment & Plan Note (Signed)
Deteriorated. Pt non compliant with metformin, but will start taking as prescribed Patient educated about the importance of limiting  Carbohydrate intake , the need to commit to daily physical activity for a minimum of 30 minutes , and to commit weight loss. The fact that changes in all these areas will reduce or eliminate all together the development of diabetes is stressed.

## 2013-09-22 NOTE — Assessment & Plan Note (Signed)
Normal mammogram in 2014

## 2013-09-22 NOTE — Progress Notes (Signed)
   Subjective:    Patient ID: Deanna Bush, female    DOB: [birth year], 52 y.o.   MRN: [id number]  HPI The PT is here for follow up and re-evaluation of chronic medical conditions, medication management and review of any available recent lab and radiology data.  Preventive health is updated, specifically  Cancer screening and Immunization.   Questions or concerns regarding consultations or procedures which the PT has had in the interim are  Addressed.Recently treated at urgent care for uncontrolled allergy symptoms The PT denies any adverse reactions to current medications since the last visit. Stopped cholesterol med and did not take metformin, no specific reason, no problem with either med There are no new concerns.  There are no specific complaints       Review of Systems See HPI Denies recent fever or chills. Denies sinus pressure, nasal congestion, ear pain or sore throat. Denies chest congestion, productive cough or wheezing. Denies chest pains, palpitations and leg swelling Denies abdominal pain, nausea, vomiting,diarrhea or constipation.   Denies dysuria, frequency, hesitancy or incontinence. Denies joint pain, swelling and limitation in mobility. Denies headaches, seizures, numbness, or tingling. Denies depression, anxiety or insomnia. Denies skin break down or rash.        Objective:   Physical Exam  Patient alert and oriented and in no cardiopulmonary distress.  HEENT: No facial asymmetry, EOMI, no sinus tenderness,  oropharynx pink and moist.  Neck supple no adenopathy.  Chest: Clear to auscultation bilaterally.  CVS: S1, S2 no murmurs, no S3.  ABD: Soft non tender. Bowel sounds normal.  Ext: No edema  MS: Adequate ROM spine, shoulders, hips and knees.  Skin: Intact, no ulcerations or rash noted.  Psych: Good eye contact, normal affect. Memory intact not anxious or depressed appearing.  CNS: CN 2-12 intact, power, tone and sensation normal  throughout.       Assessment & Plan:

## 2013-09-22 NOTE — Assessment & Plan Note (Signed)
Deteriorated, non compliant with medication Hyperlipidemia:Low fat diet discussed and encouraged.

## 2013-10-26 ENCOUNTER — Other Ambulatory Visit: Payer: Self-pay

## 2013-10-26 ENCOUNTER — Telehealth: Payer: Self-pay

## 2013-10-26 DIAGNOSIS — R6889 Other general symptoms and signs: Secondary | ICD-10-CM

## 2013-10-26 MED ORDER — OSELTAMIVIR PHOSPHATE 75 MG PO CAPS: 75.0000 mg | ORAL_CAPSULE | Freq: Two times a day (BID) | ORAL | Status: DC

## 2013-10-26 NOTE — Telephone Encounter (Signed)
Acute onset of fever yes Headache yes Body ache yes Fatigue yes Non productive cough yes Sore throat no Nasal discharge no  Onset between 1-4 days of possible exposure yes  Recommendation:  Fluid, rest, Tylenol for control of fever  Expect 5 days before feeling better and not so contagious and generally better in 7-10 days.  Standard treatment Tama flu 75 mg 1 tablet twice daily times 5 days  Please call office if symptoms do not improve or worsen in 5 days after beginning treatment.

## 2013-12-22 ENCOUNTER — Telehealth: Payer: Self-pay | Admitting: Family Medicine

## 2013-12-22 DIAGNOSIS — E039 Hypothyroidism, unspecified: Secondary | ICD-10-CM

## 2013-12-23 MED ORDER — LEVOTHYROXINE SODIUM 100 MCG PO TABS: 100.0000 ug | ORAL_TABLET | Freq: Every day | ORAL | Status: DC

## 2013-12-23 NOTE — Telephone Encounter (Signed)
Med sent to [facility] per request  

## 2014-01-14 ENCOUNTER — Other Ambulatory Visit: Payer: Self-pay

## 2014-01-14 DIAGNOSIS — R7303 Prediabetes: Secondary | ICD-10-CM

## 2014-01-14 MED ORDER — METFORMIN HCL ER (OSM) 500 MG PO TB24: 500.0000 mg | ORAL_TABLET | Freq: Every day | ORAL | Status: DC

## 2014-01-21 LAB — COMPLETE METABOLIC PANEL WITH GFR
ALT: 10 U/L (ref 0–35)
AST: 14 U/L (ref 0–37)
Albumin: 4.1 g/dL (ref 3.5–5.2)
Alkaline Phosphatase: 69 U/L (ref 39–117)
BUN: 17 mg/dL (ref 6–23)
CO2: 30 mEq/L (ref 19–32)
Calcium: 9.6 mg/dL (ref 8.4–10.5)
Chloride: 105 mEq/L (ref 96–112)
Creat: 0.77 mg/dL (ref 0.50–1.10)
GFR, Est African American: >89 mL/min
GFR, Est Non African American: 88 mL/min
Glucose, Bld: 101 mg/dL — ABNORMAL HIGH (ref 70–99)
Potassium: 4.2 mEq/L (ref 3.5–5.3)
Sodium: 140 mEq/L (ref 135–145)
Total Bilirubin: 0.6 mg/dL (ref 0.2–1.2)
Total Protein: 7.2 g/dL (ref 6.0–8.3)

## 2014-01-21 LAB — LIPID PANEL
Cholesterol: 155 mg/dL (ref 0–200)
HDL: 49 mg/dL (ref >39)
LDL Cholesterol: 96 mg/dL (ref 0–99)
Total CHOL/HDL Ratio: 3.2 Ratio
Triglycerides: 52 mg/dL (ref <150)
VLDL: 10 mg/dL (ref 0–40)

## 2014-01-21 LAB — HEMOGLOBIN A1C
Hgb A1c MFr Bld: 6.1 % — ABNORMAL HIGH (ref <5.7)
Mean Plasma Glucose: 128 mg/dL — ABNORMAL HIGH (ref <117)

## 2014-01-21 LAB — TSH: TSH: 0.415 u[IU]/mL (ref 0.350–4.500)

## 2014-01-26 ENCOUNTER — Encounter: Payer: Self-pay | Admitting: Family Medicine

## 2014-01-26 ENCOUNTER — Ambulatory Visit (INDEPENDENT_AMBULATORY_CARE_PROVIDER_SITE_OTHER): Payer: Managed Care, Other (non HMO) | Admitting: Family Medicine

## 2014-01-26 VITALS — BP 114/78 | HR 62 | Resp 16 | Ht 66.0 in | Wt 191.0 lb

## 2014-01-26 DIAGNOSIS — E039 Hypothyroidism, unspecified: Secondary | ICD-10-CM

## 2014-01-26 DIAGNOSIS — E785 Hyperlipidemia, unspecified: Secondary | ICD-10-CM

## 2014-01-26 DIAGNOSIS — E669 Obesity, unspecified: Secondary | ICD-10-CM

## 2014-01-26 DIAGNOSIS — R5383 Other fatigue: Secondary | ICD-10-CM

## 2014-01-26 DIAGNOSIS — R5381 Other malaise: Secondary | ICD-10-CM

## 2014-01-26 DIAGNOSIS — R7303 Prediabetes: Secondary | ICD-10-CM

## 2014-01-26 DIAGNOSIS — R7309 Other abnormal glucose: Secondary | ICD-10-CM

## 2014-01-26 MED ORDER — ATORVASTATIN CALCIUM 10 MG PO TABS: 10.0000 mg | ORAL_TABLET | Freq: Every day | ORAL | Status: DC

## 2014-01-26 NOTE — Patient Instructions (Signed)
CPE in mid October, call if you need me before  Congrats on improved labs , and healthy lifestyle   CBC, fasting lipid, cmp and EGFR, hBA1C, TSH in October   It is important that you exercise regularly at least 30 minutes 7 times a week. If you develop chest pain, have severe difficulty breathing, or feel very tired, stop exercising immediately and seek medical attention   A healthy diet is rich in fruit, vegetables and whole grains. Poultry fish, nuts and beans are a healthy choice for protein rather then red meat. A low sodium diet and drinking 64 ounces of water daily is generally recommended. Oils and sweet should be limited. Carbohydrates especially for those who are diabetic or overweight, should be limited to 60-45 gram per meal. It is important to eat on a regular schedule, at least 3 times daily. Snacks should be primarily fruits, vegetables or nuts.  Weight loss goal of 5 to 7 pounds

## 2014-01-30 NOTE — Assessment & Plan Note (Signed)
Improved with lifestyle change and metformin, continue both Patient educated about the importance of limiting  Carbohydrate intake , the need to commit to daily physical activity for a minimum of 30 minutes , and to commit weight loss. The fact that changes in all these areas will reduce or eliminate all together the development of diabetes is stressed.

## 2014-01-30 NOTE — Progress Notes (Signed)
   Subjective:    Patient ID: Deanna Bush, female    DOB: 09/07/1961, 53 y.o.   MRN: [id number]  HPI The PT is here for follow up and re-evaluation of chronic medical conditions, medication management and review of any available recent lab and radiology data.  Preventive health is updated, specifically  Cancer screening and Immunization.   Was treated in urgent care since last visit for acute respiratory infection The PT denies any adverse reactions to current medications since the last visit.  There are no new concerns. Has been diligent with improved lifestyle with encouraging results There are no specific complaints       Review of Systems See HPI Denies recent fever or chills. Denies sinus pressure, nasal congestion, ear pain or sore throat. Denies chest congestion, productive cough or wheezing. Denies chest pains, palpitations and leg swelling Denies abdominal pain, nausea, vomiting,diarrhea or constipation.   Denies dysuria, frequency, hesitancy or incontinence. Denies joint pain, swelling and limitation in mobility. Denies headaches, seizures, numbness, or tingling. Denies depression, anxiety or insomnia. Denies skin break down or rash.        Objective:   Physical Exam BP 114/78  Pulse 62  Resp 16  Ht 5\' 6"  (1.676 m)  Wt 191 lb (86.637 kg)  BMI 30.84 kg/m2  SpO2 98% Patient alert and oriented and in no cardiopulmonary distress.  HEENT: No facial asymmetry, EOMI, no sinus tenderness,  oropharynx pink and moist.  Neck supple no adenopathy.  Chest: Clear to auscultation bilaterally.  CVS: S1, S2 no murmurs, no S3.  ABD: Soft non tender. Bowel sounds normal.  Ext: No edema  MS: Adequate ROM spine, shoulders, hips and knees.  Skin: Intact, no ulcerations or rash noted.  Psych: Good eye contact, normal affect. Memory intact not anxious or depressed appearing.  CNS: CN 2-12 intact, power, tone and sensation normal throughout.        Assessment &  Plan:  HYPOTHYROIDISM Controlled, no change in medication Updated lab needed at/ before next visit.   Prediabetes Improved with lifestyle change and metformin, continue both Patient educated about the importance of limiting  Carbohydrate intake , the need to commit to daily physical activity for a minimum of 30 minutes , and to commit weight loss. The fact that changes in all these areas will reduce or eliminate all together the development of diabetes is stressed.     HYPERLIPIDEMIA Improved and controlled. Continue same med dose Hyperlipidemia:Low fat diet discussed and encouraged.  Updated lab needed at/ before next visit.   OBESITY, UNSPECIFIED Improved. Pt applauded on succesful weight loss through lifestyle change, and encouraged to continue same. Weight loss goal set for the next several months.

## 2014-01-30 NOTE — Assessment & Plan Note (Signed)
Improved. Pt applauded on succesful weight loss through lifestyle change, and encouraged to continue same. Weight loss goal set for the next several months.  

## 2014-01-30 NOTE — Assessment & Plan Note (Signed)
Controlled, no change in medication Updated lab needed at/ before next visit.  

## 2014-01-30 NOTE — Assessment & Plan Note (Signed)
Improved and controlled. Continue same med dose Hyperlipidemia:Low fat diet discussed and encouraged.  Updated lab needed at/ before next visit.

## 2014-05-04 ENCOUNTER — Other Ambulatory Visit: Payer: Self-pay | Admitting: Family Medicine

## 2014-07-11 ENCOUNTER — Other Ambulatory Visit: Payer: Self-pay | Admitting: Family Medicine

## 2014-07-11 DIAGNOSIS — Z1231 Encounter for screening mammogram for malignant neoplasm of breast: Secondary | ICD-10-CM

## 2014-07-12 LAB — COMPLETE METABOLIC PANEL WITH GFR
ALT: 12 U/L (ref 0–35)
AST: 13 U/L (ref 0–37)
Albumin: 4 g/dL (ref 3.5–5.2)
Alkaline Phosphatase: 66 U/L (ref 39–117)
BILIRUBIN TOTAL: 0.6 mg/dL (ref 0.2–1.2)
BUN: 15 mg/dL (ref 6–23)
CALCIUM: 9.2 mg/dL (ref 8.4–10.5)
CHLORIDE: 105 meq/L (ref 96–112)
CO2: 27 mEq/L (ref 19–32)
CREATININE: 0.82 mg/dL (ref 0.50–1.10)
GFR, EST NON AFRICAN AMERICAN: 81 mL/min
GFR, Est African American: >89 mL/min
Glucose, Bld: 97 mg/dL (ref 70–99)
Potassium: 4 mEq/L (ref 3.5–5.3)
Sodium: 139 mEq/L (ref 135–145)
Total Protein: 6.9 g/dL (ref 6.0–8.3)

## 2014-07-12 LAB — HEMOGLOBIN A1C
Hgb A1c MFr Bld: 6.3 % — ABNORMAL HIGH (ref <5.7)
Mean Plasma Glucose: 134 mg/dL — ABNORMAL HIGH (ref <117)

## 2014-07-12 LAB — LIPID PANEL
CHOLESTEROL: 154 mg/dL (ref 0–200)
HDL: 56 mg/dL (ref >39)
LDL Cholesterol: 86 mg/dL (ref 0–99)
Total CHOL/HDL Ratio: 2.8 Ratio
Triglycerides: 62 mg/dL (ref <150)
VLDL: 12 mg/dL (ref 0–40)

## 2014-07-12 LAB — TSH: TSH: 0.466 u[IU]/mL (ref 0.350–4.500)

## 2014-07-20 ENCOUNTER — Ambulatory Visit (INDEPENDENT_AMBULATORY_CARE_PROVIDER_SITE_OTHER): Payer: Managed Care, Other (non HMO) | Admitting: Family Medicine

## 2014-07-20 ENCOUNTER — Encounter: Payer: Self-pay | Admitting: Family Medicine

## 2014-07-20 ENCOUNTER — Other Ambulatory Visit (HOSPITAL_COMMUNITY)
Admission: RE | Admit: 2014-07-20 | Discharge: 2014-07-20 | Disposition: A | Discharge status: Home / Self Care | Payer: Managed Care, Other (non HMO) | Source: Ambulatory Visit | Attending: Family Medicine | Admitting: Family Medicine

## 2014-07-20 VITALS — BP 126/82 | HR 60 | Resp 16 | Ht 66.0 in | Wt 190.0 lb

## 2014-07-20 DIAGNOSIS — Z0000 Encounter for general adult medical examination without abnormal findings: Secondary | ICD-10-CM | POA: Insufficient documentation

## 2014-07-20 DIAGNOSIS — Z01419 Encounter for gynecological examination (general) (routine) without abnormal findings: Secondary | ICD-10-CM | POA: Diagnosis present

## 2014-07-20 DIAGNOSIS — E039 Hypothyroidism, unspecified: Secondary | ICD-10-CM

## 2014-07-20 DIAGNOSIS — Z1211 Encounter for screening for malignant neoplasm of colon: Secondary | ICD-10-CM

## 2014-07-20 DIAGNOSIS — Z124 Encounter for screening for malignant neoplasm of cervix: Secondary | ICD-10-CM

## 2014-07-20 DIAGNOSIS — R7303 Prediabetes: Secondary | ICD-10-CM

## 2014-07-20 LAB — POC HEMOCCULT BLD/STL (OFFICE/1-CARD/DIAGNOSTIC): Fecal Occult Blood, POC: NEGATIVE

## 2014-07-20 NOTE — Patient Instructions (Signed)
F/u in 4 months, call if you need me before  You are referred for for diabetic education  Continue great health habits  HBA1C, TSH  In 4 months, before f/u non fast

## 2014-07-20 NOTE — Assessment & Plan Note (Signed)
No palpable rectal mass and stool is negative for hidden blood

## 2014-07-20 NOTE — Progress Notes (Signed)
   Subjective:    Patient ID: Deanna Bush, female    DOB: 1961-08-19, 53 y.o.   MRN: [id number]  HPI Patient is in for pelvic and breast exam. Recent labs are reviewed, no changes in medication made, she is being referred for nutritional education since her blood sugar has increased despite excellent lifestyle changes.  Review of Systems See HPI     Objective:   Physical Exam  BP 126/82  Pulse 60  Resp 16  Ht 5\' 6"  (1.676 m)  Wt 190 lb (86.183 kg)  BMI 30.68 kg/m2  SpO2 99% Pleasant well nourished female, alert and oriented x 3, in no cardio-pulmonary distress. Afebrile. HEENT No facial trauma or asymetry. Sinuses non tender.  EOMI, PERTL, fundoscopic exam  no hemorhage or exudate.  External ears normal, tympanic membranes clear. Oropharynx moist, no exudate, good dentition. Neck: supple, no adenopathy,JVD or thyromegaly.No bruits.  Chest: Clear to ascultation bilaterally.No crackles or wheezes. Non tender to palpation  Breast: No asymetry,no masses or lumps. No tenderness. No nipple discharge or inversion. No axillary or supraclavicular adenopathy  Cardiovascular system; Heart sounds normal,  S1 and  S2 ,no S3.  No murmur, or thrill. Apical beat not displaced Peripheral pulses normal.  Abdomen: Soft, non tender, no organomegaly or masses. No bruits. Bowel sounds normal. No guarding, tenderness or rebound.  Rectal:  Normal sphincter tone. No mass.No rectal masses.  Guaiac negative stool.  GU: External genitalia normal female genitalia , female distribution of hair. No lesions. Urethral meatus normal in size, no  Prolapse, no lesions visibly  Present. Bladder non tender. Vagina pink and moist , with no visible lesions ,physiologic discharge present . Adequate pelvic support no  cystocele or rectocele noted Cervix pink and appears healthy, no lesions or ulcerations noted, no discharge noted from os Uterus normal size, no adnexal masses, no cervical motion  or adnexal tenderness.   Musculoskeletal exam: Full ROM of spine, hips , shoulders and knees. No deformity ,swelling or crepitus noted. No muscle wasting or atrophy.   Neurologic: Cranial nerves 2 to 12 intact. Power, tone ,sensation and reflexes normal throughout. No disturbance in gait. No tremor.  Skin: Intact, no ulceration, erythema , scaling or rash noted. Pigmentation normal throughout  Psych; Normal mood and affect. Judgement and concentration normal       Assessment & Plan:  Annual physical exam Annual exam as documented  Pap sent  Pt encouraged to continue healthy lifestyle, and she is also referred for diabetic ed  Special screening for malignant neoplasms, colon No palpable rectal mass and stool is negative for hidden blood

## 2014-07-20 NOTE — Assessment & Plan Note (Signed)
Annual exam as documented  Pap sent  Pt encouraged to continue healthy lifestyle, and she is also referred for diabetic ed

## 2014-07-21 ENCOUNTER — Other Ambulatory Visit: Payer: Self-pay

## 2014-07-21 DIAGNOSIS — R7303 Prediabetes: Secondary | ICD-10-CM

## 2014-07-22 LAB — CYTOLOGY - PAP

## 2014-08-10 ENCOUNTER — Ambulatory Visit (HOSPITAL_COMMUNITY)
Admission: RE | Admit: 2014-08-10 | Discharge: 2014-08-10 | Disposition: A | Discharge status: Home / Self Care | Payer: Managed Care, Other (non HMO) | Source: Ambulatory Visit | Attending: Family Medicine | Admitting: Family Medicine

## 2014-08-10 DIAGNOSIS — Z1231 Encounter for screening mammogram for malignant neoplasm of breast: Secondary | ICD-10-CM | POA: Insufficient documentation

## 2014-08-23 ENCOUNTER — Other Ambulatory Visit: Payer: Self-pay

## 2014-08-23 ENCOUNTER — Telehealth: Payer: Self-pay | Admitting: Family Medicine

## 2014-08-23 DIAGNOSIS — E785 Hyperlipidemia, unspecified: Secondary | ICD-10-CM

## 2014-08-23 MED ORDER — LEVOTHYROXINE SODIUM 100 MCG PO TABS: 100.0000 ug | ORAL_TABLET | Freq: Every day | ORAL | Status: DC

## 2014-08-23 MED ORDER — METFORMIN HCL ER 500 MG PO TB24: ORAL_TABLET | ORAL | Status: DC

## 2014-08-23 MED ORDER — ATORVASTATIN CALCIUM 10 MG PO TABS: 10.0000 mg | ORAL_TABLET | Freq: Every day | ORAL | Status: DC

## 2014-08-23 NOTE — Telephone Encounter (Signed)
meds have been refilled  

## 2014-11-11 ENCOUNTER — Other Ambulatory Visit: Payer: Self-pay

## 2014-11-11 DIAGNOSIS — E785 Hyperlipidemia, unspecified: Secondary | ICD-10-CM

## 2014-11-11 MED ORDER — LEVOTHYROXINE SODIUM 100 MCG PO TABS: 100.0000 ug | ORAL_TABLET | Freq: Every day | ORAL | Status: DC

## 2014-11-11 MED ORDER — METFORMIN HCL ER 500 MG PO TB24: ORAL_TABLET | ORAL | Status: DC

## 2014-11-11 MED ORDER — ATORVASTATIN CALCIUM 10 MG PO TABS: 10.0000 mg | ORAL_TABLET | Freq: Every day | ORAL | Status: DC

## 2014-11-16 ENCOUNTER — Other Ambulatory Visit: Payer: Self-pay | Admitting: Family Medicine

## 2014-11-16 LAB — CBC WITH DIFFERENTIAL/PLATELET
BASOS ABS: 0 10*3/uL (ref 0.0–0.1)
Basophils Relative: 0 % (ref 0–1)
Eosinophils Absolute: 0.1 10*3/uL (ref 0.0–0.7)
Eosinophils Relative: 2 % (ref 0–5)
HEMATOCRIT: 36.9 % (ref 36.0–46.0)
Hemoglobin: 12.1 g/dL (ref 12.0–15.0)
LYMPHS PCT: 43 % (ref 12–46)
Lymphs Abs: 2.2 10*3/uL (ref 0.7–4.0)
MCH: 29.3 pg (ref 26.0–34.0)
MCHC: 32.8 g/dL (ref 30.0–36.0)
MCV: 89.3 fL (ref 78.0–100.0)
Monocytes Absolute: 0.3 10*3/uL (ref 0.1–1.0)
Monocytes Relative: 5 % (ref 3–12)
Neutro Abs: 2.6 10*3/uL (ref 1.7–7.7)
Neutrophils Relative %: 50 % (ref 43–77)
Platelets: 298 10*3/uL (ref 150–400)
RBC: 4.13 MIL/uL (ref 3.87–5.11)
RDW: 13.3 % (ref 11.5–15.5)
WBC: 5.1 10*3/uL (ref 4.0–10.5)

## 2014-11-19 LAB — HEMOGLOBIN A1C
HEMOGLOBIN A1C: 6.2 % — AB (ref <5.7)
MEAN PLASMA GLUCOSE: 131 mg/dL — AB (ref <117)

## 2014-11-23 ENCOUNTER — Encounter: Payer: Self-pay | Admitting: Family Medicine

## 2014-11-23 ENCOUNTER — Ambulatory Visit (INDEPENDENT_AMBULATORY_CARE_PROVIDER_SITE_OTHER): Payer: Managed Care, Other (non HMO) | Admitting: Family Medicine

## 2014-11-23 VITALS — BP 120/78 | HR 73 | Resp 16 | Ht 66.0 in | Wt 190.0 lb

## 2014-11-23 DIAGNOSIS — E785 Hyperlipidemia, unspecified: Secondary | ICD-10-CM

## 2014-11-23 DIAGNOSIS — R7309 Other abnormal glucose: Secondary | ICD-10-CM

## 2014-11-23 DIAGNOSIS — R7303 Prediabetes: Secondary | ICD-10-CM

## 2014-11-23 DIAGNOSIS — E039 Hypothyroidism, unspecified: Secondary | ICD-10-CM

## 2014-11-23 DIAGNOSIS — E669 Obesity, unspecified: Secondary | ICD-10-CM

## 2014-11-23 NOTE — Progress Notes (Signed)
   Subjective:    Patient ID: Deanna Bush, female    DOB: 11/13/1960, 54 y.o.   MRN: [id number]  HPI The PT is here for follow up and re-evaluation of chronic medical conditions, medication management and review of any available recent lab and radiology data.  Preventive health is updated, specifically  Cancer screening and Immunization.   Questions or concerns regarding consultations or procedures which the PT has had in the interim are  addressed. The PT denies any adverse reactions to current medications since the last visit.  There are no new concerns.  There are no specific complaints       Review of Systems See HPI Denies recent fever or chills. Denies sinus pressure, nasal congestion, ear pain or sore throat. Denies chest congestion, productive cough or wheezing. Denies chest pains, palpitations and leg swelling Denies abdominal pain, nausea, vomiting,diarrhea or constipation.   Denies dysuria, frequency, hesitancy or incontinence. Denies joint pain, swelling and limitation in mobility. Denies headaches, seizures, numbness, or tingling. Denies depression, anxiety or insomnia. Denies skin break down or rash.        Objective:   Physical Exam BP 120/78 mmHg  Pulse 73  Resp 16  Ht 5\' 6"  (1.676 m)  Wt 190 lb (86.183 kg)  BMI 30.68 kg/m2  SpO2 99% Patient alert and oriented and in no cardiopulmonary distress.  HEENT: No facial asymmetry, EOMI,   oropharynx pink and moist.  Neck supple no JVD, no mass.  Chest: Clear to auscultation bilaterally.  CVS: S1, S2 no murmurs, no S3.Regular rate.  ABD: Soft non tender.   Ext: No edema  MS: Adequate ROM spine, shoulders, hips and knees.  Skin: Intact, no ulcerations or rash noted.  Psych: Good eye contact, normal affect. Memory intact not anxious or depressed appearing.  CNS: CN 2-12 intact, power,  normal throughout.no focal deficits noted.        Assessment & Plan:  Hypothyroidism Controlled, no change  in medication Updated lab needed at/ before next visit. \   Prediabetes Improved Patient educated about the importance of limiting  Carbohydrate intake , the need to commit to daily physical activity for a minimum of 30 minutes , and to commit weight loss. The fact that changes in all these areas will reduce or eliminate all together the development of diabetes is stressed.      Obesity (BMI 30.0-34.9) Unchanged Patient re-educated about  the importance of commitment to a  minimum of 150 minutes of exercise per week. The importance of healthy food choices with portion control discussed. Encouraged to start a food diary, count calories and to consider  joining a support group. Sample diet sheets offered. Goals set by the patient for the next several months.      Hyperlipidemia with target LDL less than 100 Hyperlipidemia:Low fat diet discussed and encouraged.  Updated lab needed at/ before next visit.

## 2014-11-23 NOTE — Patient Instructions (Addendum)
F/u in 4 month, call if you need me before  CONGRATS on improved blood sugar as you improve your health habits  Bone marrow function is normal you are not anemic also  Fasting lipid, cmp, and EGFr, HBA1C , TSH in 4 month before visit  It is important that you exercise regularly at least 30 minutes 5 times a week. If you develop chest pain, have severe difficulty breathing, or feel very tired, stop exercising immediately and seek medical attention   Aim for 6 to 8  Pound weight loss  I wiill be in touch if Dr Oneida Alar needs to see you  Pls check for coverage for Prevnar with your insurance

## 2014-11-25 ENCOUNTER — Telehealth: Payer: Self-pay | Admitting: Family Medicine

## 2014-11-27 NOTE — Assessment & Plan Note (Signed)
Hyperlipidemia:Low fat diet discussed and encouraged.  Updated lab needed at/ before next visit.  

## 2014-11-27 NOTE — Assessment & Plan Note (Signed)
Improved Patient educated about the importance of limiting  Carbohydrate intake , the need to commit to daily physical activity for a minimum of 30 minutes , and to commit weight loss. The fact that changes in all these areas will reduce or eliminate all together the development of diabetes is stressed.

## 2014-11-27 NOTE — Assessment & Plan Note (Signed)
Unchanged. Patient re-educated about  the importance of commitment to a  minimum of 150 minutes of exercise per week. The importance of healthy food choices with portion control discussed. Encouraged to start a food diary, count calories and to consider  joining a support group. Sample diet sheets offered. Goals set by the patient for the next several months.    

## 2014-11-27 NOTE — Assessment & Plan Note (Signed)
Controlled, no change in medication Updated lab needed at/ before next visit.  

## 2014-11-28 NOTE — Telephone Encounter (Signed)
rx sent

## 2015-03-22 ENCOUNTER — Other Ambulatory Visit: Payer: Self-pay | Admitting: Family Medicine

## 2015-03-23 LAB — COMPLETE METABOLIC PANEL WITH GFR
ALBUMIN: 4.3 g/dL (ref 3.5–5.2)
ALK PHOS: 69 U/L (ref 39–117)
ALT: 14 U/L (ref 0–35)
AST: 16 U/L (ref 0–37)
BUN: 14 mg/dL (ref 6–23)
CALCIUM: 9.3 mg/dL (ref 8.4–10.5)
CHLORIDE: 106 meq/L (ref 96–112)
CO2: 25 mEq/L (ref 19–32)
CREATININE: 0.75 mg/dL (ref 0.50–1.10)
GFR, Est African American: >89 mL/min
Glucose, Bld: 91 mg/dL (ref 70–99)
POTASSIUM: 4.1 meq/L (ref 3.5–5.3)
Sodium: 139 mEq/L (ref 135–145)
Total Bilirubin: 0.5 mg/dL (ref 0.2–1.2)
Total Protein: 7.2 g/dL (ref 6.0–8.3)

## 2015-03-23 LAB — HEMOGLOBIN A1C
Hgb A1c MFr Bld: 6.6 % — ABNORMAL HIGH (ref <5.7)
MEAN PLASMA GLUCOSE: 143 mg/dL — AB (ref <117)

## 2015-03-23 LAB — LIPID PANEL
CHOLESTEROL: 164 mg/dL (ref 0–200)
HDL: 65 mg/dL (ref >=46)
LDL CALC: 89 mg/dL (ref 0–99)
Total CHOL/HDL Ratio: 2.5 Ratio
Triglycerides: 51 mg/dL (ref <150)
VLDL: 10 mg/dL (ref 0–40)

## 2015-03-23 LAB — TSH: TSH: 0.261 u[IU]/mL — ABNORMAL LOW (ref 0.350–4.500)

## 2015-03-29 ENCOUNTER — Encounter: Payer: Self-pay | Admitting: Family Medicine

## 2015-03-29 ENCOUNTER — Ambulatory Visit (INDEPENDENT_AMBULATORY_CARE_PROVIDER_SITE_OTHER): Payer: Managed Care, Other (non HMO) | Admitting: Family Medicine

## 2015-03-29 VITALS — BP 126/80 | HR 62 | Resp 18 | Ht 66.0 in | Wt 191.0 lb

## 2015-03-29 DIAGNOSIS — Z23 Encounter for immunization: Secondary | ICD-10-CM | POA: Insufficient documentation

## 2015-03-29 DIAGNOSIS — E1159 Type 2 diabetes mellitus with other circulatory complications: Secondary | ICD-10-CM | POA: Insufficient documentation

## 2015-03-29 DIAGNOSIS — J069 Acute upper respiratory infection, unspecified: Secondary | ICD-10-CM | POA: Diagnosis not present

## 2015-03-29 DIAGNOSIS — M1712 Unilateral primary osteoarthritis, left knee: Secondary | ICD-10-CM

## 2015-03-29 DIAGNOSIS — E119 Type 2 diabetes mellitus without complications: Secondary | ICD-10-CM | POA: Diagnosis not present

## 2015-03-29 DIAGNOSIS — E785 Hyperlipidemia, unspecified: Secondary | ICD-10-CM | POA: Diagnosis not present

## 2015-03-29 DIAGNOSIS — R7303 Prediabetes: Secondary | ICD-10-CM | POA: Insufficient documentation

## 2015-03-29 DIAGNOSIS — E1169 Type 2 diabetes mellitus with other specified complication: Secondary | ICD-10-CM

## 2015-03-29 DIAGNOSIS — E669 Obesity, unspecified: Secondary | ICD-10-CM

## 2015-03-29 DIAGNOSIS — E038 Other specified hypothyroidism: Secondary | ICD-10-CM

## 2015-03-29 NOTE — Progress Notes (Signed)
Deanna Bush     MRN: [id number]      DOB: 1961/09/16   HPI Deanna Bush is here for follow up and re-evaluation of chronic medical conditions, medication management and review of any available recent lab and radiology data.  Preventive health is updated, specifically  Cancer screening and Immunization.   Questions or concerns regarding consultations or procedures which the PT has had in the interim are  addressed. The PT denies any adverse reactions to current medications since the last visit.  3 day h/o head congestion, yellow green drainage, sore throat , ear pressure, some cough, yellow sputum   ROS Denies recent fever or chills. Denies sinus pressure, nasal congestion, ear pain or sore throat. Denies chest congestion, productive cough or wheezing. Denies chest pains, palpitations and leg swelling Denies abdominal pain, nausea, vomiting,diarrhea or constipation.   Denies dysuria, frequency, hesitancy or incontinence. Denies joint pain, swelling and limitation in mobility. Denies headaches, seizures, numbness, or tingling. Denies depression, anxiety or insomnia. Denies skin break down or rash.   PE  BP 126/80 mmHg  Pulse 62  Resp 18  Ht 5\' 6"  (1.676 m)  Wt 191 lb (86.637 kg)  BMI 30.84 kg/m2  SpO2 99%  Patient alert and oriented and in no cardiopulmonary distress.  HEENT: No facial asymmetry, EOMI,   oropharynx pink and moist.  Neck supple no JVD, no mass.  Chest: Clear to auscultation bilaterally.  CVS: S1, S2 no murmurs, no S3.Regular rate.  ABD: Soft non tender.   Ext: No edema  MS: Adequate ROM spine, shoulders, hips and knees.  Skin: Intact, no ulcerations or rash noted.  Psych: Good eye contact, normal affect. Memory intact not anxious or depressed appearing.  CNS: CN 2-12 intact, power,  normal throughout.no focal deficits noted.   Assessment & Plan   Prediabetes Deanna Bush is reminded of the importance of commitment to daily physical activity for 30  minutes or more, as able and the need to limit carbohydrate intake to 30 to 60 grams per meal to help with blood sugar control.   The need to take medication as prescribed, test blood sugar as directed, and to call between visits if there is a concern that blood sugar is uncontrolled is also discussed.   Deanna Bush is reminded of the importance of daily foot exam, annual eye examination, and good blood sugar, blood pressure and cholesterol control.  Diabetic Labs Latest Ref Rng 11/16/2014 07/12/2014 01/21/2014 09/10/2013 05/12/2013  HbA1c <5.7 % 6.2(H) 6.3(H) 6.1(H) 6.5(H) 6.1(H)  Chol 0 - 200 mg/dL - 154 155 253(H) -  HDL >39 mg/dL - 56 49 57 -  Calc LDL 0 - 99 mg/dL - 86 96 183(H) -  Triglycerides <150 mg/dL - 62 52 63 -  Creatinine 0.50 - 1.10 mg/dL - 0.82 0.77 - 0.76   BP/Weight 03/29/2015 11/23/2014 07/20/2014 01/26/2014 09/22/2013 09/10/2013 07/37/1062  Systolic BP 694 854 627 035 009 381 829  Diastolic BP 80 78 82 78 78 65 76  Wt. (Lbs) 191 190 190 191 194 - 195  BMI 30.84 30.68 30.68 30.84 31.33 - 31.49   No flowsheet data found.       Acute upper respiratory infection Symptomatic treatment only , call back if deteriorates  Diabetes mellitus type 2 in obese Deteriorated, now a diabertic States she wants to hold on referral, will read on hr own  Deanna Bush is reminded of the importance of commitment to daily physical activity for 30 minutes or  more, as able and the need to limit carbohydrate intake to 30 to 60 grams per meal to help with blood sugar control.   The need to take medication as prescribed, test blood sugar as directed, and to call between visits if there is a concern that blood sugar is uncontrolled is also discussed.   Deanna Bush is reminded of the importance of daily foot exam, annual eye examination, and good blood sugar, blood pressure and cholesterol control.  Diabetic Labs Latest Ref Rng 11/16/2014 07/12/2014 01/21/2014 09/10/2013 05/12/2013  HbA1c <5.7 % 6.2(H) 6.3(H)  6.1(H) 6.5(H) 6.1(H)  Chol 0 - 200 mg/dL - 154 155 253(H) -  HDL >39 mg/dL - 56 49 57 -  Calc LDL 0 - 99 mg/dL - 86 96 183(H) -  Triglycerides <150 mg/dL - 62 52 63 -  Creatinine 0.50 - 1.10 mg/dL - 0.82 0.77 - 0.76   BP/Weight 03/29/2015 11/23/2014 07/20/2014 01/26/2014 09/22/2013 09/10/2013 82/99/3716  Systolic BP 967 893 810 175 102 585 277  Diastolic BP 80 78 82 78 78 65 76  Wt. (Lbs) 191 190 190 191 194 - 195  BMI 30.84 30.68 30.68 30.84 31.33 - 31.49   No flowsheet data found.       Need for vaccination with 13-polyvalent pneumococcal conjugate vaccine After obtaining informed consent, the vaccine is  administered by LPN.   Hyperlipidemia with target LDL less than 100 Controlled, no change in medication Hyperlipidemia:Low fat diet discussed and encouraged.   Lipid Panel  Lab Results  Component Value Date   CHOL 154 07/12/2014   HDL 56 07/12/2014   LDLCALC 86 07/12/2014   TRIG 62 07/12/2014   CHOLHDL 2.8 07/12/2014        Obesity (BMI 30.0-34.9) Deteriorated. Patient re-educated about  the importance of commitment to a  minimum of 150 minutes of exercise per week.  The importance of healthy food choices with portion control discussed. Encouraged to start a food diary, count calories and to consider  joining a support group. Sample diet sheets offered. Goals set by the patient for the next several months.   Weight /BMI 03/29/2015 11/23/2014 07/20/2014  WEIGHT 191 lb 190 lb 190 lb  HEIGHT 5\' 6"  5\' 6"  5\' 6"   BMI 30.84 kg/m2 30.68 kg/m2 30.68 kg/m2    Current exercise per week 200 minutes.   Hypothyroidism Overcorrected, half tab on Sat and Sunday  Osteoarthritis of left knee Asymptomatic with regular exercise

## 2015-03-29 NOTE — Assessment & Plan Note (Signed)
Controlled, no change in medication Hyperlipidemia:Low fat diet discussed and encouraged.   Lipid Panel  Lab Results  Component Value Date   CHOL 154 07/12/2014   HDL 56 07/12/2014   LDLCALC 86 07/12/2014   TRIG 62 07/12/2014   CHOLHDL 2.8 07/12/2014

## 2015-03-29 NOTE — Assessment & Plan Note (Addendum)
Deteriorated, now a diabertic States she wants to hold on referral, will read on hr own  Deanna Bush is reminded of the importance of commitment to daily physical activity for 30 minutes or more, as able and the need to limit carbohydrate intake to 30 to 60 grams per meal to help with blood sugar control.   The need to take medication as prescribed, test blood sugar as directed, and to call between visits if there is a concern that blood sugar is uncontrolled is also discussed.   Deanna Bush is reminded of the importance of daily foot exam, annual eye examination, and good blood sugar, blood pressure and cholesterol control.  Diabetic Labs Latest Ref Rng 11/16/2014 07/12/2014 01/21/2014 09/10/2013 05/12/2013  HbA1c <5.7 % 6.2(H) 6.3(H) 6.1(H) 6.5(H) 6.1(H)  Chol 0 - 200 mg/dL - 154 155 253(H) -  HDL >39 mg/dL - 56 49 57 -  Calc LDL 0 - 99 mg/dL - 86 96 183(H) -  Triglycerides <150 mg/dL - 62 52 63 -  Creatinine 0.50 - 1.10 mg/dL - 0.82 0.77 - 0.76   BP/Weight 03/29/2015 11/23/2014 07/20/2014 01/26/2014 09/22/2013 09/10/2013 81/82/9937  Systolic BP 169 678 938 101 751 025 852  Diastolic BP 80 78 82 78 78 65 76  Wt. (Lbs) 191 190 190 191 194 - 195  BMI 30.84 30.68 30.68 30.84 31.33 - 31.49   No flowsheet data found.

## 2015-03-29 NOTE — Assessment & Plan Note (Signed)
Asymptomatic with regular exercise

## 2015-03-29 NOTE — Assessment & Plan Note (Signed)
Deanna Bush is reminded of the importance of commitment to daily physical activity for 30 minutes or more, as able and the need to limit carbohydrate intake to 30 to 60 grams per meal to help with blood sugar control.   The need to take medication as prescribed, test blood sugar as directed, and to call between visits if there is a concern that blood sugar is uncontrolled is also discussed.   Deanna Bush is reminded of the importance of daily foot exam, annual eye examination, and good blood sugar, blood pressure and cholesterol control.  Diabetic Labs Latest Ref Rng 11/16/2014 07/12/2014 01/21/2014 09/10/2013 05/12/2013  HbA1c <5.7 % 6.2(H) 6.3(H) 6.1(H) 6.5(H) 6.1(H)  Chol 0 - 200 mg/dL - 154 155 253(H) -  HDL >39 mg/dL - 56 49 57 -  Calc LDL 0 - 99 mg/dL - 86 96 183(H) -  Triglycerides <150 mg/dL - 62 52 63 -  Creatinine 0.50 - 1.10 mg/dL - 0.82 0.77 - 0.76   BP/Weight 03/29/2015 11/23/2014 07/20/2014 01/26/2014 09/22/2013 09/10/2013 12/82/0813  Systolic BP 887 195 974 718 550 158 682  Diastolic BP 80 78 82 78 78 65 76  Wt. (Lbs) 191 190 190 191 194 - 195  BMI 30.84 30.68 30.68 30.84 31.33 - 31.49   No flowsheet data found.

## 2015-03-29 NOTE — Patient Instructions (Addendum)
F/u in 4 month with rectal, call iof you need me before  Please work on good  health habits so that your health will improve. 1. Commitment to daily physical activity for 30 to 60  minutes, if you are able to do this.  2. Commitment to wise food choices. Aim for half of your  food intake to be vegetable and fruit, one quarter starchy foods, and one quarter protein. Try to eat on a regular schedule  3 meals per day, snacking between meals should be limited to vegetables or fruits or small portions of nuts. 64 ounces of water per day is generally recommended, unless you have specific health conditions, like heart failure or kidney failure where you will need to limit fluid intake.  3. Commitment to sufficient and a  good quality of physical and mental rest daily, generally between 6 to 8 hours per day.  WITH PERSISTANCE AND PERSEVERANCE, THE IMPOSSIBLE , BECOMES THE NORM!   Thanks for choosing [facility], we consider it a privelige to serve you.  If sinus pressure and respirator symptoms worsen and you get chills and fever in the next 1 weeek , call , I will prescribe an antibiotic  Take sudafed 1 daily for 3 days, saline flushes twice daily and continue daily allegra   You are now a diabetic, I recommend that you attend a class to learn how to manage and conquer the disease  Please count carbs in each meal, test once daily blood sugar and record, call with concerns.  Stay on same metformin dose  in 4 month Reduce synthroid to half  On Saturday and Sunday, continue one daily on Monday through Friday  Cholesterol, liver and kidneys are excellnet  Prevnar today Goal for fasting blood sugar ranges from 80 to 120 and 2 hours after any meal or at bedtime should be between 130 to 170.   Hba1C chem 7 and EGFr, TSH in 4 month 

## 2015-03-29 NOTE — Assessment & Plan Note (Signed)
Overcorrected, half tab on Sat and Sunday

## 2015-03-29 NOTE — Assessment & Plan Note (Signed)
After obtaining informed consent, the vaccine is  administered by LPN.  

## 2015-03-29 NOTE — Assessment & Plan Note (Signed)
Deteriorated. Patient re-educated about  the importance of commitment to a  minimum of 150 minutes of exercise per week.  The importance of healthy food choices with portion control discussed. Encouraged to start a food diary, count calories and to consider  joining a support group. Sample diet sheets offered. Goals set by the patient for the next several months.   Weight /BMI 03/29/2015 11/23/2014 07/20/2014  WEIGHT 191 lb 190 lb 190 lb  HEIGHT 5\' 6"  5\' 6"  5\' 6"   BMI 30.84 kg/m2 30.68 kg/m2 30.68 kg/m2    Current exercise per week 200 minutes.

## 2015-03-29 NOTE — Assessment & Plan Note (Addendum)
Symptomatic treatment only , call back if deteriorates

## 2015-03-29 NOTE — Addendum Note (Signed)
Addended by: Eual Fines on: 03/29/2015 10:35 AM   Modules accepted: Orders

## 2015-03-30 LAB — MICROALBUMIN / CREATININE URINE RATIO
Creatinine, Urine: 138.6 mg/dL
Microalb Creat Ratio: 2.2 mg/g (ref 0.0–30.0)
Microalb, Ur: 0.3 mg/dL (ref <2.0)

## 2015-04-01 ENCOUNTER — Emergency Department (HOSPITAL_COMMUNITY)
Admission: EM | Admit: 2015-04-01 | Discharge: 2015-04-01 | Disposition: A | Discharge status: Home / Self Care | Payer: Managed Care, Other (non HMO) | Attending: Emergency Medicine | Admitting: Emergency Medicine

## 2015-04-01 ENCOUNTER — Encounter (HOSPITAL_COMMUNITY): Payer: Self-pay | Admitting: Emergency Medicine

## 2015-04-01 DIAGNOSIS — Z872 Personal history of diseases of the skin and subcutaneous tissue: Secondary | ICD-10-CM | POA: Diagnosis not present

## 2015-04-01 DIAGNOSIS — Y9289 Other specified places as the place of occurrence of the external cause: Secondary | ICD-10-CM | POA: Diagnosis not present

## 2015-04-01 DIAGNOSIS — Y9389 Activity, other specified: Secondary | ICD-10-CM | POA: Insufficient documentation

## 2015-04-01 DIAGNOSIS — Z853 Personal history of malignant neoplasm of breast: Secondary | ICD-10-CM | POA: Insufficient documentation

## 2015-04-01 DIAGNOSIS — L500 Allergic urticaria: Secondary | ICD-10-CM | POA: Diagnosis not present

## 2015-04-01 DIAGNOSIS — T7840XA Allergy, unspecified, initial encounter: Secondary | ICD-10-CM

## 2015-04-01 DIAGNOSIS — E039 Hypothyroidism, unspecified: Secondary | ICD-10-CM | POA: Insufficient documentation

## 2015-04-01 DIAGNOSIS — Z79899 Other long term (current) drug therapy: Secondary | ICD-10-CM | POA: Diagnosis not present

## 2015-04-01 DIAGNOSIS — T63464A Toxic effect of venom of wasps, undetermined, initial encounter: Secondary | ICD-10-CM | POA: Insufficient documentation

## 2015-04-01 DIAGNOSIS — E785 Hyperlipidemia, unspecified: Secondary | ICD-10-CM | POA: Insufficient documentation

## 2015-04-01 DIAGNOSIS — Z87891 Personal history of nicotine dependence: Secondary | ICD-10-CM | POA: Insufficient documentation

## 2015-04-01 DIAGNOSIS — E119 Type 2 diabetes mellitus without complications: Secondary | ICD-10-CM | POA: Insufficient documentation

## 2015-04-01 DIAGNOSIS — Y998 Other external cause status: Secondary | ICD-10-CM | POA: Insufficient documentation

## 2015-04-01 HISTORY — DX: Type 2 diabetes mellitus without complications: E11.9

## 2015-04-01 MED ORDER — METHYLPREDNISOLONE SODIUM SUCC 125 MG IJ SOLR
125.0000 mg | Freq: Once | INTRAMUSCULAR | Status: AC
  Administered 2015-04-01: 125 mg via INTRAVENOUS

## 2015-04-01 MED ORDER — METHYLPREDNISOLONE SODIUM SUCC 125 MG IJ SOLR
INTRAMUSCULAR | Status: AC
  Filled 2015-04-01: qty 2

## 2015-04-01 MED ORDER — FAMOTIDINE 20 MG PO TABS: 20.0000 mg | ORAL_TABLET | Freq: Two times a day (BID) | ORAL | Status: DC

## 2015-04-01 MED ORDER — DIPHENHYDRAMINE HCL 50 MG/ML IJ SOLN
50.0000 mg | Freq: Once | INTRAMUSCULAR | Status: AC
  Administered 2015-04-01: 50 mg via INTRAVENOUS

## 2015-04-01 MED ORDER — FAMOTIDINE IN NACL 20-0.9 MG/50ML-% IV SOLN
20.0000 mg | Freq: Once | INTRAVENOUS | Status: AC
Start: 2015-04-01 — End: 2015-04-01
  Administered 2015-04-01: 20 mg via INTRAVENOUS

## 2015-04-01 MED ORDER — PREDNISONE 10 MG PO TABS: 40.0000 mg | ORAL_TABLET | Freq: Every day | ORAL | Status: DC

## 2015-04-01 MED ORDER — FAMOTIDINE IN NACL 20-0.9 MG/50ML-% IV SOLN
INTRAVENOUS | Status: AC
  Filled 2015-04-01: qty 50

## 2015-04-01 MED ORDER — DIPHENHYDRAMINE HCL 50 MG/ML IJ SOLN
INTRAMUSCULAR | Status: AC
  Filled 2015-04-01: qty 1

## 2015-04-01 NOTE — ED Notes (Signed)
Pt alert & oriented x4, stable gait. Patient given discharge instructions, paperwork & prescription(s). Patient  instructed to stop at the registration desk to finish any additional paperwork. Patient verbalized understanding. Pt left department w/ no further questions. 

## 2015-04-01 NOTE — ED Provider Notes (Signed)
CSN: [id number]     Arrival date & time 04/01/15  1816 History   First MD Initiated Contact with Patient 04/01/15 1826     Chief Complaint  Patient presents with  . Allergic Reaction     Patient is a 54 y.o. female presenting with allergic reaction. The history is provided by the patient. No language interpreter was used.  Allergic Reaction  Deanna Bush presents for evaluation of allergic reaction.  She was stung by a yellow jacket on her left ankle just prior to arrival.  Shortly after the sting she developed itchy rash and swelling on her face, arms, upper chest/back.  She denies any throat/tongue swelling or SOB.  She has had a cough for the last few days.  Sxs are severe and constant.  She has no hx/o severe allergy or anaphylaxis but her son has a hx/o severe allergy.    Past Medical History  Diagnosis Date  . Adenocarcinoma of breast     left   . Hyperlipidemia   . Hypothyroidism   . Cellulitis of leg, left   . Family history of breast cancer 08/16/2011  . MRSA (methicillin resistant staph aureus) culture positive 08/19/2011  . Diabetes mellitus without complication    Past Surgical History  Procedure Laterality Date  . Left mastectomy    . Cesarean section      x2   Family History  Problem Relation Age of Onset  . Hypertension Mother   . Diabetes Mother    History  Substance Use Topics  . Smoking status: Former Smoker -- 0.50 packs/day for 18 years    Types: Cigarettes    Quit date: 07/01/2002  . Smokeless tobacco: Never Used  . Alcohol Use: No   OB History    Gravida Para Term Preterm AB TAB SAB Ectopic Multiple Living   2 2 2       2      Review of Systems  All other systems reviewed and are negative.     Allergies  Sulfonamide derivatives  Home Medications   Prior to Admission medications   Medication Sig Start Date End Date Taking? Authorizing Provider  atorvastatin (LIPITOR) 10 MG tablet Take 1 tablet (10 mg total) by mouth daily. 11/11/14   Fayrene Helper, MD  Calcium-Vitamin D-Vitamin K ([facility] CALCIUM SOFT CHEWS) [id number] MG-UNT-MCG CHEW Chew 2 each by mouth daily.    Historical Provider, MD  levothyroxine (SYNTHROID, LEVOTHROID) 100 MCG tablet Take 1 tablet (100 mcg total) by mouth daily. 11/11/14   Fayrene Helper, MD  metFORMIN (GLUCOPHAGE-XR) 500 MG 24 hr tablet TAKE 1 TABLET BY MOUTH EVERY DAY WITH BREAKFAST 11/11/14   Fayrene Helper, MD  Multiple Vitamin (MULTIVITAMIN) tablet Take 1 tablet by mouth daily.      Historical Provider, MD   BP 170/76 mmHg  Pulse 82  Temp(Src) 98.4 F (36.9 C) (Oral)  Resp 18  Ht 5\' 6"  (1.676 m)  Wt 193 lb (87.544 kg)  BMI 31.17 kg/m2  SpO2 99% Physical Exam  Constitutional: She is oriented to person, place, and time. She appears well-developed and well-nourished. She appears distressed.  HENT:  Head: Normocephalic.  Mouth/Throat: Oropharynx is clear and moist.  Eyes: Pupils are equal, round, and reactive to light.  Cardiovascular: Normal rate and regular rhythm.   No murmur heard. Pulmonary/Chest: Effort normal and breath sounds normal. No respiratory distress.  No stridor  Abdominal: Soft. There is no tenderness. There is no rebound and no guarding.  Musculoskeletal: She exhibits no edema or tenderness.  Neurological: She is alert and oriented to person, place, and time.  Skin: Skin is warm and dry.  Diffuse urticaria, coalescing on face, neck, upper trunk, arms.    Psychiatric: She has a normal mood and affect. Her behavior is normal.  Nursing note and vitals reviewed.   ED Course  Procedures (including critical care time) Labs Review Labs Reviewed - No data to display  Imaging Review No results found.   EKG Interpretation None      MDM   Final diagnoses:  Allergic reaction, initial encounter    Pt here for evaluation of rash following yellow jacket sting.  On repeat eval pt much improved.  No evidence of anaphylaxis.  Discussed home care for allergic reaction as  well as close return precautions.      Deanna Reichert, MD 04/01/15 2115

## 2015-04-01 NOTE — ED Notes (Signed)
Upon arrival patient with generalized raised hives, itching. No airway/breathing problems per patient. {atient coughing but stated she has had cough since last week,  Patient states wasp sting her on her ankle. Peripheral IV started and Dr. Ralene Bathe made aware of patients arrival and condition, she assessed patient right away. See Emar for medications given.

## 2015-04-01 NOTE — Discharge Instructions (Signed)

## 2015-04-01 NOTE — ED Notes (Signed)
Patient reports being stung by yellow jackets on leg and now has hives to chest. Patient reports feeling like face is swelling. Denies any difficulty breathing and swallowing. Per patient took 1 PO benadryl.

## 2015-04-07 ENCOUNTER — Telehealth: Payer: Self-pay

## 2015-04-07 MED ORDER — EPINEPHRINE 0.3 MG/0.3ML IJ SOAJ: 0.3000 mg | Freq: Once | INTRAMUSCULAR | Status: DC

## 2015-04-07 NOTE — Telephone Encounter (Signed)
Yes pls send 

## 2015-04-07 NOTE — Addendum Note (Signed)
Addended by: Eual Fines on: 04/07/2015 11:49 AM   Modules accepted: Orders

## 2015-04-07 NOTE — Telephone Encounter (Signed)
Pen sent.

## 2015-04-07 NOTE — Telephone Encounter (Signed)
Was in ER for allergic reaction for bee stings. Needs epi pen called into [facility]. Ok to send?

## 2015-05-24 ENCOUNTER — Encounter: Payer: Managed Care, Other (non HMO) | Attending: Family Medicine | Admitting: Nutrition

## 2015-05-24 ENCOUNTER — Encounter: Payer: Self-pay | Admitting: Nutrition

## 2015-05-24 VITALS — Ht 66.0 in | Wt 190.0 lb

## 2015-05-24 DIAGNOSIS — Z6830 Body mass index (BMI) 30.0-30.9, adult: Secondary | ICD-10-CM | POA: Insufficient documentation

## 2015-05-24 DIAGNOSIS — Z713 Dietary counseling and surveillance: Secondary | ICD-10-CM | POA: Insufficient documentation

## 2015-05-24 DIAGNOSIS — E669 Obesity, unspecified: Secondary | ICD-10-CM | POA: Diagnosis not present

## 2015-05-24 DIAGNOSIS — E119 Type 2 diabetes mellitus without complications: Secondary | ICD-10-CM | POA: Diagnosis not present

## 2015-05-24 NOTE — Progress Notes (Signed)
Diabetes Self-Management Education  Visit Type: First/Initial  Appt. Start Time: 1330 Appt. End Time: 1430  05/24/2015  [name], identified by name and date of birth, is a 54 y.o. female with a diagnosis of Diabetes: Type 2.   ASSESSMENT  Height 5\' 6"  (1.676 m), weight 190 lb (86.183 kg). Body mass index is 30.68 kg/(m^2).      Diabetes Self-Management Education - 05/24/15 1733    Psychosocial Assessment   Patient Concerns Nutrition/Meal planning;Healthy Lifestyle;Weight Control   Special Needs None   Preferred Learning Style No preference indicated   Learning Readiness Change in progress   Complications   Are you checking your feet? Yes   Patient Education   Disease state  Definition of diabetes, type 1 and 2, and the diagnosis of diabetes;Factors that contribute to the development of diabetes;Explored patient's options for treatment of their diabetes   Nutrition management  Role of diet in the treatment of diabetes and the relationship between the three main macronutrients and blood glucose level;Carbohydrate counting;Reviewed blood glucose goals for pre and post meals and how to evaluate the patients' food intake on their blood glucose level.;Meal timing in regards to the patients' current diabetes medication.;Meal options for control of blood glucose level and chronic complications.   Physical activity and exercise  Role of exercise on diabetes management, blood pressure control and cardiac health.;Helped patient identify appropriate exercises in relation to his/her diabetes, diabetes complications and other health issue.   Medications Reviewed patients medication for diabetes, action, purpose, timing of dose and side effects.   Monitoring Identified appropriate SMBG and/or A1C goals.;Daily foot exams;Yearly dilated eye exam;Taught/discussed recording of test results and interpretation of SMBG.   Acute complications Discussed and identified patients' treatment of  hyperglycemia.   Chronic complications Relationship between chronic complications and blood glucose control;Lipid levels, blood glucose control and heart disease;Dental care   Psychosocial adjustment Worked with patient to identify barriers to care and solutions;Helped patient identify a support system for diabetes management;Identified and addressed patients feelings and concerns about diabetes;Brainstormed with patient on coping mechanisms for social situations, getting support from significant others, dealing with feelings about diabetes;Role of stress on diabetes   Personal strategies to promote health Lifestyle issues that need to be addressed for better diabetes care   Individualized Goals (developed by patient)   Nutrition Follow meal plan discussed;General guidelines for healthy choices and portions discussed   Physical Activity Exercise 3-5 times per week   Medications take my medication as prescribed   Monitoring  test my blood glucose as discussed   Reducing Risk examine blood glucose patterns;do foot checks daily   Outcomes   Expected Outcomes Demonstrated interest in learning. Expect positive outcomes   Future DMSE 4-6 wks   Program Status Completed      Individualized Plan for Diabetes Self-Management Training:   Learning Objective:  Patient will have a greater understanding of diabetes self-management. Patient education plan is to attend individual and/or group sessions per assessed needs and concerns.   Plan:   Patient Instructions  Goals 1. Follow the Plate Methods. 2. Lose 8 lbs in the next three months. 3. Avoid snacks betwmeals. 4. Exercicse 45-60 minutes 4-5 times per week. 5. Get A1C down to 5.7% or less. 6. Measure foods out for portion control.    Expected Outcomes:  Demonstrated interest in learning. Expect positive outcomes  Education material provided: Meal plan card, My Plate, weight loss tips  If problems or questions, patient to contact team via:  Phone  Future DSME appointment: 4-6 wks

## 2015-05-24 NOTE — Patient Instructions (Signed)
Goals 1. Follow the Plate Methods. 2. Lose 8 lbs in the next three months. 3. Avoid snacks betwmeals. 4. Exercicse 45-60 minutes 4-5 times per week. 5. Get A1C down to 5.7% or less. 6. Measure foods out for portion control.

## 2015-05-30 ENCOUNTER — Other Ambulatory Visit: Payer: Self-pay

## 2015-05-30 DIAGNOSIS — E785 Hyperlipidemia, unspecified: Secondary | ICD-10-CM

## 2015-05-30 MED ORDER — METFORMIN HCL ER 500 MG PO TB24: ORAL_TABLET | ORAL | Status: DC

## 2015-05-30 MED ORDER — LEVOTHYROXINE SODIUM 100 MCG PO TABS: 100.0000 ug | ORAL_TABLET | Freq: Every day | ORAL | Status: DC

## 2015-05-30 MED ORDER — ATORVASTATIN CALCIUM 10 MG PO TABS: 10.0000 mg | ORAL_TABLET | Freq: Every day | ORAL | Status: DC

## 2015-07-14 ENCOUNTER — Other Ambulatory Visit: Payer: Self-pay | Admitting: Family Medicine

## 2015-07-14 DIAGNOSIS — Z1231 Encounter for screening mammogram for malignant neoplasm of breast: Secondary | ICD-10-CM

## 2015-07-20 LAB — HEMOGLOBIN A1C
Hgb A1c MFr Bld: 6.3 % — ABNORMAL HIGH (ref <5.7)
MEAN PLASMA GLUCOSE: 134 mg/dL — AB (ref <117)

## 2015-07-20 LAB — BASIC METABOLIC PANEL WITH GFR
BUN: 14 mg/dL (ref 7–25)
CO2: 30 mmol/L (ref 20–31)
CREATININE: 0.67 mg/dL (ref 0.50–1.05)
Calcium: 9.2 mg/dL (ref 8.6–10.4)
Chloride: 105 mmol/L (ref 98–110)
GFR, Est African American: >89 mL/min (ref >=60)
GFR, Est Non African American: >89 mL/min (ref >=60)
Glucose, Bld: 90 mg/dL (ref 65–99)
Potassium: 4.4 mmol/L (ref 3.5–5.3)
Sodium: 140 mmol/L (ref 135–146)

## 2015-07-20 LAB — TSH: TSH: 0.737 u[IU]/mL (ref 0.350–4.500)

## 2015-07-26 ENCOUNTER — Ambulatory Visit (INDEPENDENT_AMBULATORY_CARE_PROVIDER_SITE_OTHER): Payer: Managed Care, Other (non HMO) | Admitting: Family Medicine

## 2015-07-26 ENCOUNTER — Encounter: Payer: Self-pay | Admitting: Family Medicine

## 2015-07-26 VITALS — BP 120/78 | HR 68 | Resp 16 | Ht 66.0 in | Wt 186.0 lb

## 2015-07-26 DIAGNOSIS — E1169 Type 2 diabetes mellitus with other specified complication: Secondary | ICD-10-CM

## 2015-07-26 DIAGNOSIS — Z1211 Encounter for screening for malignant neoplasm of colon: Secondary | ICD-10-CM | POA: Diagnosis not present

## 2015-07-26 DIAGNOSIS — E119 Type 2 diabetes mellitus without complications: Secondary | ICD-10-CM

## 2015-07-26 DIAGNOSIS — E669 Obesity, unspecified: Secondary | ICD-10-CM

## 2015-07-26 DIAGNOSIS — E785 Hyperlipidemia, unspecified: Secondary | ICD-10-CM

## 2015-07-26 DIAGNOSIS — M1712 Unilateral primary osteoarthritis, left knee: Secondary | ICD-10-CM

## 2015-07-26 DIAGNOSIS — R7303 Prediabetes: Secondary | ICD-10-CM | POA: Diagnosis not present

## 2015-07-26 DIAGNOSIS — Z1159 Encounter for screening for other viral diseases: Secondary | ICD-10-CM

## 2015-07-26 LAB — POC HEMOCCULT BLD/STL (OFFICE/1-CARD/DIAGNOSTIC): Fecal Occult Blood, POC: NEGATIVE

## 2015-07-26 NOTE — Addendum Note (Signed)
Addended by: Denman George B on: 07/26/2015 12:17 PM   Modules accepted: Orders

## 2015-07-26 NOTE — Assessment & Plan Note (Signed)
NO CURRENT FLARE OR RECENT FLARE

## 2015-07-26 NOTE — Assessment & Plan Note (Signed)
Controlled, no change in medication Hyperlipidemia:Low fat diet discussed and encouraged.   Lipid Panel  Lab Results  Component Value Date   CHOL 164 03/22/2015   HDL 65 03/22/2015   LDLCALC 89 03/22/2015   TRIG 51 03/22/2015   CHOLHDL 2.5 03/22/2015      Updated lab needed at/ before next visit.

## 2015-07-26 NOTE — Assessment & Plan Note (Signed)
IMPROVED Patient re-educated about  the importance of commitment to a  minimum of 150 minutes of exercise per week.  The importance of healthy food choices with portion control discussed. Encouraged to start a food diary, count calories and to consider  joining a support group. Sample diet sheets offered. Goals set by the patient for the next several months.   Weight /BMI 07/26/2015 05/24/2015 04/01/2015  WEIGHT 186 lb 190 lb 193 lb  HEIGHT 5\' 6"  5\' 6"  5\' 6"   BMI 30.04 kg/m2 30.68 kg/m2 31.17 kg/m2    Current exercise per week 150 minutes.

## 2015-07-26 NOTE — Assessment & Plan Note (Signed)
Controlled, no change in medication Updated lab needed at/ before next visit.  

## 2015-07-26 NOTE — Progress Notes (Signed)
Subjective:    Patient ID: Deanna Bush, female    DOB: 05-05-1961, 54 y.o.   MRN: [id number]  HPI    Deanna Bush     MRN: [id number]      DOB: [birth year]   HPI Deanna Bush is here for follow up and re-evaluation of chronic medical conditions, medication management and review of any available recent lab and radiology data.  Preventive health is updated, specifically  Cancer screening and Immunization.   Questions or concerns regarding consultations or procedures which the PT has had in the interim are  addressed. The PT denies any adverse reactions to current medications since the last visit.  There are no new concerns.  There are no specific complaints   ROS Denies recent fever or chills. Denies sinus pressure, nasal congestion, ear pain or sore throat. Denies chest congestion, productive cough or wheezing. Denies chest pains, palpitations and leg swelling Denies abdominal pain, nausea, vomiting,diarrhea or constipation.   Denies dysuria, frequency, hesitancy or incontinence. Denies joint pain, swelling and limitation in mobility. Denies headaches, seizures, numbness, or tingling. Denies depression, anxiety or insomnia. Denies skin break down or rash.   PE  BP 120/78 mmHg  Pulse 68  Resp 16  Ht 5\' 6"  (1.676 m)  Wt 186 lb (84.369 kg)  BMI 30.04 kg/m2  SpO2 98%  Patient alert and oriented and in no cardiopulmonary distress.  HEENT: No facial asymmetry, EOMI,   oropharynx pink and moist.  Neck supple no JVD, no mass.  Chest: Clear to auscultation bilaterally.  CVS: S1, S2 no murmurs, no S3.Regular rate.  ABD: Soft non tender. No organomegaly or mass, normal BS Rectal, no mass, heme negative stool  Ext: No edema  MS: Adequate ROM spine, shoulders, hips and knees.  Skin: Intact, no ulcerations or rash noted.  Psych: Good eye contact, normal affect. Memory intact not anxious or depressed appearing.  CNS: CN 2-12 intact, power,  normal throughout.no focal  deficits noted.   Assessment & Plan   Hypothyroidism Controlled, no change in medication Updated lab needed at/ before next visit.   Diabetes mellitus type 2 in obese Improved Deanna Bush is reminded of the importance of commitment to daily physical activity for 30 minutes or more, as able and the need to limit carbohydrate intake to 30 to 60 grams per meal to help with blood sugar control.   The need to take medication as prescribed, test blood sugar as directed, and to call between visits if there is a concern that blood sugar is uncontrolled is also discussed.   Deanna Bush is reminded of the importance of daily foot exam, annual eye examination, and good blood sugar, blood pressure and cholesterol control.  Diabetic Labs Latest Ref Rng 07/19/2015 03/29/2015 03/22/2015 11/16/2014 07/12/2014  HbA1c <5.7 % 6.3(H) - 6.6(H) 6.2(H) 6.3(H)  Microalbumin <2.0 mg/dL - 0.3 - - -  Micro/Creat Ratio 0.0 - 30.0 mg/g - 2.2 - - -  Chol 0 - 200 mg/dL - - 164 - 154  HDL >=46 mg/dL - - 65 - 56  Calc LDL 0 - 99 mg/dL - - 89 - 86  Triglycerides <150 mg/dL - - 51 - 62  Creatinine 0.50 - 1.05 mg/dL 0.67 - 0.75 - 0.82   BP/Weight 07/26/2015 05/24/2015 04/01/2015 03/29/2015 11/23/2014 07/20/2014 9/93/7169  Systolic BP 678 - 938 101 751 025 852  Diastolic BP 78 - 73 80 78 82 78  Wt. (Lbs) 186 190 193 191 190 190 191  BMI 30.04 30.68 31.17 30.84 30.68 30.68 30.84   Foot/eye exam completion dates 03/29/2015  Foot Form Completion Done         Obesity (BMI 30.0-34.9) IMPROVED Patient re-educated about  the importance of commitment to a  minimum of 150 minutes of exercise per week.  The importance of healthy food choices with portion control discussed. Encouraged to start a food diary, count calories and to consider  joining a support group. Sample diet sheets offered. Goals set by the patient for the next several months.   Weight /BMI 07/26/2015 05/24/2015 04/01/2015  WEIGHT 186 lb 190 lb 193 lb  HEIGHT  5\' 6"  5\' 6"  5\' 6"   BMI 30.04 kg/m2 30.68 kg/m2 31.17 kg/m2    Current exercise per week 150 minutes.   Hyperlipidemia with target LDL less than 100 Controlled, no change in medication Hyperlipidemia:Low fat diet discussed and encouraged.   Lipid Panel  Lab Results  Component Value Date   CHOL 164 03/22/2015   HDL 65 03/22/2015   LDLCALC 89 03/22/2015   TRIG 51 03/22/2015   CHOLHDL 2.5 03/22/2015      Updated lab needed at/ before next visit.   Osteoarthritis of left knee NO CURRENT FLARE OR RECENT FLARE      Review of Systems     Objective:   Physical Exam        Assessment & Plan:

## 2015-07-26 NOTE — Patient Instructions (Addendum)
F/U in 4 month, call if you need me before  Congrats on excellent labs and lifestyle , keep it up  Target 4 pound weight losss  Fasting labs ion 4 months  Rectal exam is normal  Please work on good  health habits so that your health will improve. 1. Commitment to daily physical activity for 30 to 60  minutes, if you are able to do this.  2. Commitment to wise food choices. Aim for half of your  food intake to be vegetable and fruit, one quarter starchy foods, and one quarter protein. Try to eat on a regular schedule  3 meals per day, snacking between meals should be limited to vegetables or fruits or small portions of nuts. 64 ounces of water per day is generally recommended, unless you have specific health conditions, like heart failure or kidney failure where you will need to limit fluid intake.  3. Commitment to sufficient and a  good quality of physical and mental rest daily, generally between 6 to 8 hours per day.  WITH PERSISTANCE AND PERSEVERANCE, THE IMPOSSIBLE , BECOMES THE NORM!  Thanks for choosing [facility], we consider it a privelige to serve you.

## 2015-07-26 NOTE — Assessment & Plan Note (Signed)
Improved Deanna Bush is reminded of the importance of commitment to daily physical activity for 30 minutes or more, as able and the need to limit carbohydrate intake to 30 to 60 grams per meal to help with blood sugar control.   The need to take medication as prescribed, test blood sugar as directed, and to call between visits if there is a concern that blood sugar is uncontrolled is also discussed.   Deanna Bush is reminded of the importance of daily foot exam, annual eye examination, and good blood sugar, blood pressure and cholesterol control.  Diabetic Labs Latest Ref Rng 07/19/2015 03/29/2015 03/22/2015 11/16/2014 07/12/2014  HbA1c <5.7 % 6.3(H) - 6.6(H) 6.2(H) 6.3(H)  Microalbumin <2.0 mg/dL - 0.3 - - -  Micro/Creat Ratio 0.0 - 30.0 mg/g - 2.2 - - -  Chol 0 - 200 mg/dL - - 164 - 154  HDL >=46 mg/dL - - 65 - 56  Calc LDL 0 - 99 mg/dL - - 89 - 86  Triglycerides <150 mg/dL - - 51 - 62  Creatinine 0.50 - 1.05 mg/dL 0.67 - 0.75 - 0.82   BP/Weight 07/26/2015 05/24/2015 04/01/2015 03/29/2015 11/23/2014 07/20/2014 9/62/2297  Systolic BP 989 - 211 941 740 814 481  Diastolic BP 78 - 73 80 78 82 78  Wt. (Lbs) 186 190 193 191 190 190 191  BMI 30.04 30.68 31.17 30.84 30.68 30.68 30.84   Foot/eye exam completion dates 03/29/2015  Foot Form Completion Done

## 2015-08-16 ENCOUNTER — Ambulatory Visit (HOSPITAL_COMMUNITY): Payer: Managed Care, Other (non HMO)

## 2015-08-16 ENCOUNTER — Ambulatory Visit (HOSPITAL_COMMUNITY)
Admission: RE | Admit: 2015-08-16 | Discharge: 2015-08-16 | Disposition: A | Discharge status: Home / Self Care | Payer: Managed Care, Other (non HMO) | Source: Ambulatory Visit | Attending: Family Medicine | Admitting: Family Medicine

## 2015-08-16 ENCOUNTER — Other Ambulatory Visit: Payer: Self-pay | Admitting: Family Medicine

## 2015-08-16 DIAGNOSIS — Z1231 Encounter for screening mammogram for malignant neoplasm of breast: Secondary | ICD-10-CM | POA: Diagnosis present

## 2015-08-23 ENCOUNTER — Ambulatory Visit: Payer: Managed Care, Other (non HMO) | Admitting: Nutrition

## 2015-10-11 LAB — HM DIABETES EYE EXAM

## 2015-11-24 ENCOUNTER — Other Ambulatory Visit: Payer: Self-pay | Admitting: Family Medicine

## 2015-11-24 LAB — HEMOGLOBIN A1C
Hgb A1c MFr Bld: 6.2 % — ABNORMAL HIGH (ref <5.7)
Mean Plasma Glucose: 131 mg/dL — ABNORMAL HIGH (ref <117)

## 2015-11-25 LAB — COMPLETE METABOLIC PANEL WITH GFR
ALT: 13 U/L (ref 6–29)
AST: 14 U/L (ref 10–35)
Albumin: 4.3 g/dL (ref 3.6–5.1)
Alkaline Phosphatase: 67 U/L (ref 33–130)
BILIRUBIN TOTAL: 0.6 mg/dL (ref 0.2–1.2)
BUN: 14 mg/dL (ref 7–25)
CO2: 27 mmol/L (ref 20–31)
Calcium: 9.4 mg/dL (ref 8.6–10.4)
Chloride: 104 mmol/L (ref 98–110)
Creat: 0.76 mg/dL (ref 0.50–1.05)
GFR, Est African American: >89 mL/min (ref >=60)
GFR, Est Non African American: 88 mL/min (ref >=60)
Glucose, Bld: 97 mg/dL (ref 65–99)
POTASSIUM: 4.1 mmol/L (ref 3.5–5.3)
SODIUM: 139 mmol/L (ref 135–146)
TOTAL PROTEIN: 7.3 g/dL (ref 6.1–8.1)

## 2015-11-25 LAB — LIPID PANEL
CHOL/HDL RATIO: 2.5 ratio (ref <=5.0)
CHOLESTEROL: 177 mg/dL (ref 125–200)
HDL: 70 mg/dL (ref >=46)
LDL CALC: 95 mg/dL (ref <130)
TRIGLYCERIDES: 60 mg/dL (ref <150)
VLDL: 12 mg/dL (ref <30)

## 2015-11-25 LAB — HEPATITIS C ANTIBODY: HCV Ab: NEGATIVE

## 2015-11-25 LAB — TSH: TSH: 0.45 mIU/L

## 2015-11-28 ENCOUNTER — Other Ambulatory Visit: Payer: Self-pay | Admitting: Family Medicine

## 2015-11-29 ENCOUNTER — Ambulatory Visit (INDEPENDENT_AMBULATORY_CARE_PROVIDER_SITE_OTHER): Payer: Managed Care, Other (non HMO) | Admitting: Family Medicine

## 2015-11-29 ENCOUNTER — Encounter: Payer: Self-pay | Admitting: Family Medicine

## 2015-11-29 VITALS — BP 132/78 | HR 78 | Resp 18 | Ht 66.0 in | Wt 188.0 lb

## 2015-11-29 DIAGNOSIS — E669 Obesity, unspecified: Secondary | ICD-10-CM | POA: Diagnosis not present

## 2015-11-29 DIAGNOSIS — E1169 Type 2 diabetes mellitus with other specified complication: Secondary | ICD-10-CM

## 2015-11-29 DIAGNOSIS — E785 Hyperlipidemia, unspecified: Secondary | ICD-10-CM

## 2015-11-29 DIAGNOSIS — E038 Other specified hypothyroidism: Secondary | ICD-10-CM

## 2015-11-29 DIAGNOSIS — E559 Vitamin D deficiency, unspecified: Secondary | ICD-10-CM

## 2015-11-29 DIAGNOSIS — E119 Type 2 diabetes mellitus without complications: Secondary | ICD-10-CM

## 2015-11-29 NOTE — Assessment & Plan Note (Signed)
Deteriorated. Patient re-educated about  the importance of commitment to a  minimum of 150 minutes of exercise per week.  The importance of healthy food choices with portion control discussed. Encouraged to start a food diary, count calories and to consider  joining a support group. Sample diet sheets offered. Goals set by the patient for the next several months.   Weight /BMI 11/29/2015 07/26/2015 05/24/2015  WEIGHT 188 lb 186 lb 190 lb  HEIGHT 5\' 6"  5\' 6"  5\' 6"   BMI 30.36 kg/m2 30.04 kg/m2 30.68 kg/m2    Current exercise per week 150 minutes.

## 2015-11-29 NOTE — Assessment & Plan Note (Signed)
Deanna Bush is reminded of the importance of commitment to daily physical activity for 30 minutes or more, as able and the need to limit carbohydrate intake to 30 to 60 grams per meal to help with blood sugar control.   The need to take medication as prescribed, test blood sugar as directed, and to call between visits if there is a concern that blood sugar is uncontrolled is also discussed.   Deanna Bush is reminded of the importance of daily foot exam, annual eye examination, and good blood sugar, blood pressure and cholesterol control. Controlled, no change in medication Improved  Diabetic Labs Latest Ref Rng 11/24/2015 07/19/2015 03/29/2015 03/22/2015 11/16/2014  HbA1c <5.7 % 6.2(H) 6.3(H) - 6.6(H) 6.2(H)  Microalbumin <2.0 mg/dL - - 0.3 - -  Micro/Creat Ratio 0.0 - 30.0 mg/g - - 2.2 - -  Chol 125 - 200 mg/dL 177 - - 164 -  HDL >=46 mg/dL 70 - - 65 -  Calc LDL <130 mg/dL 95 - - 89 -  Triglycerides <150 mg/dL 60 - - 51 -  Creatinine 0.50 - 1.05 mg/dL 0.76 0.67 - 0.75 -   BP/Weight 11/29/2015 07/26/2015 05/24/2015 04/01/2015 03/29/2015 11/23/2014 [id number]  Systolic BP Q000111Q 123456 - 123XX123 123XX123 123456 123XX123  Diastolic BP 78 78 - 73 80 78 82  Wt. (Lbs) 188 186 190 193 191 190 190  BMI 30.36 30.04 30.68 31.17 30.84 30.68 30.68   Foot/eye exam completion dates Latest Ref Rng 10/11/2015 03/29/2015  Eye Exam No Retinopathy No Retinopathy -  Foot Form Completion - - Done

## 2015-11-29 NOTE — Patient Instructions (Addendum)
F/u  In 4 month, call if you need me before  CBC,HBa1C, TSH , chem 7 and EGFR in 5 month  It is important that you exercise regularly at least 30 minutes 5 times a week. If you develop chest pain, have severe difficulty breathing, or feel very tired, stop exercising immediately and seek medical attention   Vit D added to current lab  Thanks for choosing [facility], we consider it a privelige to serve you.

## 2015-11-29 NOTE — Assessment & Plan Note (Signed)
Updated lab needed at/ before next visit.   

## 2015-11-29 NOTE — Assessment & Plan Note (Signed)
Controlled, no change in medication  

## 2015-11-29 NOTE — Assessment & Plan Note (Signed)
Hyperlipidemia:Low fat diet discussed and encouraged.   Lipid Panel  Lab Results  Component Value Date   CHOL 177 11/24/2015   HDL 70 11/24/2015   LDLCALC 95 11/24/2015   TRIG 60 11/24/2015   CHOLHDL 2.5 11/24/2015   Controlled, no change in medication

## 2015-11-29 NOTE — Progress Notes (Signed)
Subjective:    Patient ID: Deanna Bush, female    DOB: Oct 20, 1960, 55 y.o.   MRN: [id number]  HPI   Deanna Bush     MRN: [id number]      DOB: 1961-04-17   HPI Deanna Bush is here for follow up and re-evaluation of chronic medical conditions, medication management and review of any available recent lab and radiology data.  Preventive health is updated, specifically  Cancer screening and Immunization.   Questions or concerns regarding consultations or procedures which the PT has had in the interim are  addressed. The PT denies any adverse reactions to current medications since the last visit.  There are no new concerns.  There are no specific complaints   ROS Denies recent fever or chills. Denies sinus pressure, nasal congestion, ear pain or sore throat. Denies chest congestion, productive cough or wheezing. Denies chest pains, palpitations and leg swelling Denies abdominal pain, nausea, vomiting,diarrhea or constipation.   Denies dysuria, frequency, hesitancy or incontinence. Denies joint pain, swelling and limitation in mobility. Denies headaches, seizures, numbness, or tingling. Denies depression, anxiety or insomnia. Denies skin break down or rash.   PE  BP 132/78 mmHg  Pulse 78  Resp 18  Ht 5\' 6"  (1.676 m)  Wt 188 lb (85.276 kg)  BMI 30.36 kg/m2  SpO2 97%  Patient alert and oriented and in no cardiopulmonary distress.  HEENT: No facial asymmetry, EOMI,   oropharynx pink and moist.  Neck supple no JVD, no mass.  Chest: Clear to auscultation bilaterally.  CVS: S1, S2 no murmurs, no S3.Regular rate.  ABD: Soft non tender.   Ext: No edema  MS: Adequate ROM spine, shoulders, hips and knees.  Skin: Intact, no ulcerations or rash noted.  Psych: Good eye contact, normal affect. Memory intact not anxious or depressed appearing.  CNS: CN 2-12 intact, power,  normal throughout.no focal deficits noted.   Assessment & Plan   Diabetes mellitus type 2 in  obese Deanna Bush is reminded of the importance of commitment to daily physical activity for 30 minutes or more, as able and the need to limit carbohydrate intake to 30 to 60 grams per meal to help with blood sugar control.   The need to take medication as prescribed, test blood sugar as directed, and to call between visits if there is a concern that blood sugar is uncontrolled is also discussed.   Deanna Bush is reminded of the importance of daily foot exam, annual eye examination, and good blood sugar, blood pressure and cholesterol control. Controlled, no change in medication Improved  Diabetic Labs Latest Ref Rng 11/24/2015 07/19/2015 03/29/2015 03/22/2015 11/16/2014  HbA1c <5.7 % 6.2(H) 6.3(H) - 6.6(H) 6.2(H)  Microalbumin <2.0 mg/dL - - 0.3 - -  Micro/Creat Ratio 0.0 - 30.0 mg/g - - 2.2 - -  Chol 125 - 200 mg/dL 177 - - 164 -  HDL >=46 mg/dL 70 - - 65 -  Calc LDL <130 mg/dL 95 - - 89 -  Triglycerides <150 mg/dL 60 - - 51 -  Creatinine 0.50 - 1.05 mg/dL 0.76 0.67 - 0.75 -   BP/Weight 11/29/2015 07/26/2015 05/24/2015 04/01/2015 03/29/2015 11/23/2014 [id number]  Systolic BP Q000111Q 123456 - 123XX123 123XX123 123456 123XX123  Diastolic BP 78 78 - 73 80 78 82  Wt. (Lbs) 188 186 190 193 191 190 190  BMI 30.36 30.04 30.68 31.17 30.84 30.68 30.68   Foot/eye exam completion dates Latest Ref Rng 10/11/2015 03/29/2015  Eye Exam No Retinopathy  No Retinopathy -  Foot Form Completion - - Done         Hypothyroidism Controlled, no change in medication   Hyperlipidemia with target LDL less than 100 Hyperlipidemia:Low fat diet discussed and encouraged.   Lipid Panel  Lab Results  Component Value Date   CHOL 177 11/24/2015   HDL 70 11/24/2015   LDLCALC 95 11/24/2015   TRIG 60 11/24/2015   CHOLHDL 2.5 11/24/2015   Controlled, no change in medication      Obesity (BMI 30.0-34.9) Deteriorated. Patient re-educated about  the importance of commitment to a  minimum of 150 minutes of exercise per week.  The  importance of healthy food choices with portion control discussed. Encouraged to start a food diary, count calories and to consider  joining a support group. Sample diet sheets offered. Goals set by the patient for the next several months.   Weight /BMI 11/29/2015 07/26/2015 05/24/2015  WEIGHT 188 lb 186 lb 190 lb  HEIGHT 5\' 6"  5\' 6"  5\' 6"   BMI 30.36 kg/m2 30.04 kg/m2 30.68 kg/m2    Current exercise per week 150 minutes.   Vitamin D deficiency Updated lab needed at/ before next visit.       Review of Systems     Objective:   Physical Exam        Assessment & Plan:

## 2015-12-01 LAB — VITAMIN D 25 HYDROXY (VIT D DEFICIENCY, FRACTURES): Vit D, 25-Hydroxy: 28 ng/mL — ABNORMAL LOW (ref 30–100)

## 2015-12-08 ENCOUNTER — Other Ambulatory Visit: Payer: Self-pay | Admitting: Family Medicine

## 2015-12-22 ENCOUNTER — Other Ambulatory Visit: Payer: Self-pay | Admitting: Family Medicine

## 2016-03-23 LAB — CBC WITH DIFFERENTIAL/PLATELET
BASOS ABS: 0 {cells}/uL (ref 0–200)
Basophils Relative: 0 %
EOS PCT: 1 %
Eosinophils Absolute: 40 cells/uL (ref 15–500)
HEMATOCRIT: 35.4 % (ref 35.0–45.0)
HEMOGLOBIN: 11.4 g/dL — AB (ref 11.7–15.5)
LYMPHS ABS: 2080 {cells}/uL (ref 850–3900)
Lymphocytes Relative: 52 %
MCH: 29.3 pg (ref 27.0–33.0)
MCHC: 32.2 g/dL (ref 32.0–36.0)
MCV: 91 fL (ref 80.0–100.0)
MONO ABS: 240 {cells}/uL (ref 200–950)
MPV: 10.2 fL (ref 7.5–12.5)
Monocytes Relative: 6 %
NEUTROS PCT: 41 %
Neutro Abs: 1640 cells/uL (ref 1500–7800)
Platelets: 318 10*3/uL (ref 140–400)
RBC: 3.89 MIL/uL (ref 3.80–5.10)
RDW: 13.8 % (ref 11.0–15.0)
WBC: 4 10*3/uL (ref 3.8–10.8)

## 2016-03-23 LAB — TSH: TSH: 1.34 mIU/L

## 2016-03-24 LAB — BASIC METABOLIC PANEL WITH GFR
BUN: 18 mg/dL (ref 7–25)
CO2: 23 mmol/L (ref 20–31)
Calcium: 9.5 mg/dL (ref 8.6–10.4)
Chloride: 108 mmol/L (ref 98–110)
Creat: 0.75 mg/dL (ref 0.50–1.05)
GFR, EST NON AFRICAN AMERICAN: 89 mL/min (ref >=60)
GFR, Est African American: >89 mL/min (ref >=60)
GLUCOSE: 94 mg/dL (ref 65–99)
POTASSIUM: 4.2 mmol/L (ref 3.5–5.3)
Sodium: 143 mmol/L (ref 135–146)

## 2016-03-24 LAB — HEMOGLOBIN A1C
Hgb A1c MFr Bld: 6.3 % — ABNORMAL HIGH (ref <5.7)
Mean Plasma Glucose: 134 mg/dL

## 2016-04-02 ENCOUNTER — Encounter: Payer: Self-pay | Admitting: Family Medicine

## 2016-04-02 ENCOUNTER — Ambulatory Visit (HOSPITAL_COMMUNITY)
Admission: RE | Admit: 2016-04-02 | Discharge: 2016-04-02 | Disposition: A | Discharge status: Home / Self Care | Payer: Managed Care, Other (non HMO) | Source: Ambulatory Visit | Attending: Family Medicine | Admitting: Family Medicine

## 2016-04-02 ENCOUNTER — Other Ambulatory Visit: Payer: Self-pay | Admitting: Family Medicine

## 2016-04-02 ENCOUNTER — Ambulatory Visit (INDEPENDENT_AMBULATORY_CARE_PROVIDER_SITE_OTHER): Payer: Managed Care, Other (non HMO) | Admitting: Family Medicine

## 2016-04-02 VITALS — BP 130/72 | HR 60 | Resp 18 | Ht 66.0 in | Wt 188.0 lb

## 2016-04-02 DIAGNOSIS — E038 Other specified hypothyroidism: Secondary | ICD-10-CM

## 2016-04-02 DIAGNOSIS — E785 Hyperlipidemia, unspecified: Secondary | ICD-10-CM

## 2016-04-02 DIAGNOSIS — D649 Anemia, unspecified: Secondary | ICD-10-CM

## 2016-04-02 DIAGNOSIS — M25512 Pain in left shoulder: Secondary | ICD-10-CM

## 2016-04-02 DIAGNOSIS — E119 Type 2 diabetes mellitus without complications: Secondary | ICD-10-CM | POA: Diagnosis not present

## 2016-04-02 DIAGNOSIS — E66811 Obesity, class 1: Secondary | ICD-10-CM

## 2016-04-02 DIAGNOSIS — E669 Obesity, unspecified: Secondary | ICD-10-CM

## 2016-04-02 DIAGNOSIS — E1169 Type 2 diabetes mellitus with other specified complication: Secondary | ICD-10-CM

## 2016-04-02 MED ORDER — IBUPROFEN 800 MG PO TABS: 800.0000 mg | ORAL_TABLET | Freq: Three times a day (TID) | ORAL | Status: DC | PRN

## 2016-04-02 NOTE — Patient Instructions (Addendum)
Annual physical exam in early November  Good labs   Fasting labs early November   Pls schedule mammogram Oct 15 or after  Foot exam today is good  Please work on good  health habits so that your health will improve. 1. Commitment to daily physical activity for 30 to 60  minutes, if you are able to do this.  2. Commitment to wise food choices. Aim for half of your  food intake to be vegetable and fruit, one quarter starchy foods, and one quarter protein. Try to eat on a regular schedule  3 meals per day, snacking between meals should be limited to vegetables or fruits or small portions of nuts. 64 ounces of water per day is generally recommended, unless you have specific health conditions, like heart failure or kidney failure where you will need to limit fluid intake.  3. Commitment to sufficient and a  good quality of physical and mental rest daily, generally between 6 to 8 hours per day.  WITH PERSISTANCE AND PERSEVERANCE, THE IMPOSSIBLE , BECOMES THE NORM!   Thank you  for choosing [facility]. We consider it a privelige to serve you.  Delivering excellent health care in a caring and  compassionate way is our goal.  Partnering with you,  so that together we can achieve this goal is our strategy.   Pls call in next 2 weeks if pain is unchanged for referral to Dr Waldon Reining ray of shoulder today

## 2016-04-03 ENCOUNTER — Other Ambulatory Visit (HOSPITAL_COMMUNITY)
Admission: RE | Admit: 2016-04-03 | Discharge: 2016-04-03 | Disposition: A | Discharge status: Home / Self Care | Payer: Managed Care, Other (non HMO) | Source: Other Acute Inpatient Hospital | Attending: Family Medicine | Admitting: Family Medicine

## 2016-04-03 DIAGNOSIS — E669 Obesity, unspecified: Secondary | ICD-10-CM | POA: Diagnosis present

## 2016-04-03 DIAGNOSIS — E119 Type 2 diabetes mellitus without complications: Secondary | ICD-10-CM | POA: Diagnosis present

## 2016-04-04 LAB — MICROALBUMIN / CREATININE URINE RATIO
Creatinine, Urine: 48.6 mg/dL
Microalb Creat Ratio: <6.2 mg/g creat (ref 0.0–30.0)
Microalb, Ur: <3 ug/mL — ABNORMAL HIGH

## 2016-04-07 NOTE — Assessment & Plan Note (Addendum)
Unchanged d. Patient re-educated about  the importance of commitment to a  minimum of 150 minutes of exercise per week.  The importance of healthy food choices with portion control discussed. Encouraged to start a food diary, count calories and to consider  joining a support group. Sample diet sheets offered. Goals set by the patient for the next several months.   Weight /BMI 04/02/2016 11/29/2015 07/26/2015  WEIGHT 188 lb 188 lb 186 lb  HEIGHT 5\' 6"  5\' 6"  5\' 6"   BMI 30.36 kg/m2 30.36 kg/m2 30.04 kg/m2

## 2016-04-07 NOTE — Assessment & Plan Note (Signed)
Controlled, no change in medication Updated lab needed at/ before next visit.  

## 2016-04-07 NOTE — Progress Notes (Signed)
Subjective:    Patient ID: Deanna Bush, female    DOB: 1961/09/25, 55 y.o.   MRN: [id number]  HPI   Deanna Bush     MRN: [id number]      DOB: 21-Nov-1960   HPI [name] is here for follow up and re-evaluation of chronic medical conditions, medication management and review of any available recent lab and radiology data.  Preventive health is updated, specifically  Cancer screening and Immunization.   Questions or concerns regarding consultations or procedures which the PT has had in the interim are  addressed. The PT denies any adverse reactions to current medications since the last visit.  Denies polyuria, polydipsia, blurred vision , or hypoglycemic episodes. 3 week h/o left shoulder pain and reduced mobility, no trauma ROS Denies recent fever or chills. Denies sinus pressure, nasal congestion, ear pain or sore throat. Denies chest congestion, productive cough or wheezing. Denies chest pains, palpitations and leg swelling Denies abdominal pain, nausea, vomiting,diarrhea or constipation.   Denies dysuria, frequency, hesitancy or incontinence.  Denies headaches, seizures, numbness, or tingling. Denies depression, anxiety or insomnia. Denies skin break down or rash.   PE  BP 130/72 mmHg  Pulse 60  Resp 18  Ht 5\' 6"  (1.676 m)  Wt 188 lb (85.276 kg)  BMI 30.36 kg/m2  SpO2 98%  Patient alert and oriented and in no cardiopulmonary distress.  HEENT: No facial asymmetry, EOMI,   oropharynx pink and moist.  Neck supple no JVD, no mass.  Chest: Clear to auscultation bilaterally.  CVS: S1, S2 no murmurs, no S3.Regular rate.  ABD: Soft non tender.   Ext: No edema  MS: Adequate ROM spine, , hips and knees.Decreased ROM left shoulder with localized tenderness anteriorly  Skin: Intact, no ulcerations or rash noted.  Psych: Good eye contact, normal affect. Memory intact not anxious or depressed appearing.  CNS: CN 2-12 intact, power,  normal throughout.no focal deficits  noted.   Assessment & Plan  Diabetes mellitus type 2 in obese Controlled, no change in medication [name] is reminded of the importance of commitment to daily physical activity for 30 minutes or more, as able and the need to limit carbohydrate intake to 30 to 60 grams per meal to help with blood sugar control.   The need to take medication as prescribed, test blood sugar as directed, and to call between visits if there is a concern that blood sugar is uncontrolled is also discussed.   [name] is reminded of the importance of daily foot exam, annual eye examination, and good blood sugar, blood pressure and cholesterol control.  Diabetic Labs Latest Ref Rng 04/03/2016 11/29/2015 11/24/2015 07/19/2015 03/29/2015  HbA1c <5.7 % - 6.3(H) 6.2(H) 6.3(H) -  Microalbumin Not Estab. ug/mL <3.0(H) - - - 0.3  Micro/Creat Ratio 0.0 - 30.0 mg/g creat <6.2 - - - 2.2  Chol 125 - 200 mg/dL - - 177 - -  HDL >=46 mg/dL - - 70 - -  Calc LDL <130 mg/dL - - 95 - -  Triglycerides <150 mg/dL - - 60 - -  Creatinine 0.50 - 1.05 mg/dL - 0.75 0.76 0.67 -   BP/Weight 04/02/2016 11/29/2015 07/26/2015 05/24/2015 04/01/2015 03/29/2015 [id number]  Systolic BP [id number] Q000111Q 123456 - 123XX123 123XX123 123456  Diastolic BP 72 78 78 - 73 80 78  Wt. (Lbs) 188 188 186 190 193 191 190  BMI 30.36 30.36 30.04 30.68 31.17 30.84 30.68   Foot/eye exam completion dates Latest Ref Rng  04/02/2016 10/11/2015  Eye Exam No Retinopathy - No Retinopathy  Foot Form Completion - Done -         Shoulder pain, left X ray of shoulder and short anti inflammatory course, pt to call back if persists for ortho referral  Hyperlipidemia with target LDL less than 100 Hyperlipidemia:Low fat diet discussed and encouraged.   Lipid Panel  Lab Results  Component Value Date   CHOL 177 11/24/2015   HDL 70 11/24/2015   LDLCALC 95 11/24/2015   TRIG 60 11/24/2015   CHOLHDL 2.5 11/24/2015   Controlled, no change in medication Updated lab needed at/ before next  visit.      Hypothyroidism Controlled, no change in medication Updated lab needed at/ before next visit.   Obesity (BMI 30.0-34.9) Unchanged d. Patient re-educated about  the importance of commitment to a  minimum of 150 minutes of exercise per week.  The importance of healthy food choices with portion control discussed. Encouraged to start a food diary, count calories and to consider  joining a support group. Sample diet sheets offered. Goals set by the patient for the next several months.   Weight /BMI 04/02/2016 11/29/2015 07/26/2015  WEIGHT 188 lb 188 lb 186 lb  HEIGHT 5\' 6"  5\' 6"  5\' 6"   BMI 30.36 kg/m2 30.36 kg/m2 30.04 kg/m2           Review of Systems     Objective:   Physical Exam        Assessment & Plan:

## 2016-04-07 NOTE — Assessment & Plan Note (Signed)
X ray of shoulder and short anti inflammatory course, pt to call back if persists for ortho referral

## 2016-04-07 NOTE — Assessment & Plan Note (Signed)
Hyperlipidemia:Low fat diet discussed and encouraged.   Lipid Panel  Lab Results  Component Value Date   CHOL 177 11/24/2015   HDL 70 11/24/2015   LDLCALC 95 11/24/2015   TRIG 60 11/24/2015   CHOLHDL 2.5 11/24/2015   Controlled, no change in medication Updated lab needed at/ before next visit.

## 2016-04-07 NOTE — Assessment & Plan Note (Signed)
Controlled, no change in medication Deanna Bush is reminded of the importance of commitment to daily physical activity for 30 minutes or more, as able and the need to limit carbohydrate intake to 30 to 60 grams per meal to help with blood sugar control.   The need to take medication as prescribed, test blood sugar as directed, and to call between visits if there is a concern that blood sugar is uncontrolled is also discussed.   Deanna Bush is reminded of the importance of daily foot exam, annual eye examination, and good blood sugar, blood pressure and cholesterol control.  Diabetic Labs Latest Ref Rng 04/03/2016 11/29/2015 11/24/2015 07/19/2015 03/29/2015  HbA1c <5.7 % - 6.3(H) 6.2(H) 6.3(H) -  Microalbumin Not Estab. ug/mL <3.0(H) - - - 0.3  Micro/Creat Ratio 0.0 - 30.0 mg/g creat <6.2 - - - 2.2  Chol 125 - 200 mg/dL - - 177 - -  HDL >=46 mg/dL - - 70 - -  Calc LDL <130 mg/dL - - 95 - -  Triglycerides <150 mg/dL - - 60 - -  Creatinine 0.50 - 1.05 mg/dL - 0.75 0.76 0.67 -   BP/Weight 04/02/2016 11/29/2015 07/26/2015 05/24/2015 04/01/2015 03/29/2015 [id number]  Systolic BP [id number] Q000111Q 123456 - 123XX123 123XX123 123456  Diastolic BP 72 78 78 - 73 80 78  Wt. (Lbs) 188 188 186 190 193 191 190  BMI 30.36 30.36 30.04 30.68 31.17 30.84 30.68   Foot/eye exam completion dates Latest Ref Rng 04/02/2016 10/11/2015  Eye Exam No Retinopathy - No Retinopathy  Foot Form Completion - Done -

## 2016-04-12 ENCOUNTER — Other Ambulatory Visit: Payer: Self-pay | Admitting: Family Medicine

## 2016-06-24 ENCOUNTER — Other Ambulatory Visit: Payer: Self-pay | Admitting: Family Medicine

## 2016-07-25 ENCOUNTER — Other Ambulatory Visit: Payer: Self-pay | Admitting: Family Medicine

## 2016-07-25 DIAGNOSIS — Z1231 Encounter for screening mammogram for malignant neoplasm of breast: Secondary | ICD-10-CM

## 2016-08-08 ENCOUNTER — Other Ambulatory Visit: Payer: Self-pay

## 2016-08-08 ENCOUNTER — Telehealth: Payer: Self-pay

## 2016-08-08 MED ORDER — MECLIZINE HCL 25 MG PO TABS: 25.0000 mg | ORAL_TABLET | Freq: Three times a day (TID) | ORAL | 0 refills | Status: DC | PRN

## 2016-08-08 NOTE — Telephone Encounter (Signed)
Refill x 1 pls 

## 2016-08-08 NOTE — Telephone Encounter (Signed)
Med refilled.

## 2016-08-08 NOTE — Telephone Encounter (Signed)
Left message on the machine that she needed a refill of meclizine for vertigo. Its in her historic meds. Ok to refill?

## 2016-08-12 LAB — CBC
HCT: 36.6 % (ref 35.0–45.0)
Hemoglobin: 11.7 g/dL (ref 11.7–15.5)
MCH: 29 pg (ref 27.0–33.0)
MCHC: 32 g/dL (ref 32.0–36.0)
MCV: 90.8 fL (ref 80.0–100.0)
MPV: 10.4 fL (ref 7.5–12.5)
PLATELETS: 280 10*3/uL (ref 140–400)
RBC: 4.03 MIL/uL (ref 3.80–5.10)
RDW: 13.8 % (ref 11.0–15.0)
WBC: 4 10*3/uL (ref 3.8–10.8)

## 2016-08-13 ENCOUNTER — Other Ambulatory Visit: Payer: Self-pay | Admitting: Family Medicine

## 2016-08-13 LAB — COMPLETE METABOLIC PANEL WITH GFR
ALBUMIN: 4 g/dL (ref 3.6–5.1)
ALT: 14 U/L (ref 6–29)
AST: 15 U/L (ref 10–35)
Alkaline Phosphatase: 65 U/L (ref 33–130)
BILIRUBIN TOTAL: 0.4 mg/dL (ref 0.2–1.2)
BUN: 14 mg/dL (ref 7–25)
CALCIUM: 9.3 mg/dL (ref 8.6–10.4)
CO2: 28 mmol/L (ref 20–31)
Chloride: 106 mmol/L (ref 98–110)
Creat: 0.73 mg/dL (ref 0.50–1.05)
Glucose, Bld: 92 mg/dL (ref 65–99)
Potassium: 4.2 mmol/L (ref 3.5–5.3)
Sodium: 141 mmol/L (ref 135–146)
Total Protein: 6.8 g/dL (ref 6.1–8.1)

## 2016-08-13 LAB — LIPID PANEL
CHOL/HDL RATIO: 2.3 ratio (ref <5.0)
CHOLESTEROL: 159 mg/dL (ref <200)
HDL: 70 mg/dL (ref >50)
LDL Cholesterol: 82 mg/dL
Triglycerides: 37 mg/dL (ref <150)
VLDL: 7 mg/dL (ref <30)

## 2016-08-13 LAB — HEMOGLOBIN A1C
Hgb A1c MFr Bld: 6 % — ABNORMAL HIGH (ref <5.7)
MEAN PLASMA GLUCOSE: 126 mg/dL

## 2016-08-13 LAB — FERRITIN: Ferritin: 108 ng/mL (ref 10–232)

## 2016-08-13 LAB — VITAMIN B12: VITAMIN B 12: 490 pg/mL (ref 200–1100)

## 2016-08-13 LAB — TSH: TSH: 0.24 mIU/L — ABNORMAL LOW

## 2016-08-13 LAB — IRON AND TIBC
%SAT: 33 % (ref 11–50)
Iron: 89 ug/dL (ref 45–160)
TIBC: 272 ug/dL (ref 250–450)
UIBC: 183 ug/dL (ref 125–400)

## 2016-08-13 LAB — FOLATE: FOLATE: 15.3 ng/mL (ref >5.4)

## 2016-08-16 ENCOUNTER — Other Ambulatory Visit: Payer: Self-pay

## 2016-08-16 MED ORDER — LEVOTHYROXINE SODIUM 75 MCG PO TABS
75.0000 ug | ORAL_TABLET | Freq: Every day | ORAL | 3 refills | Status: DC
Start: 2016-08-16 — End: 2016-09-06

## 2016-08-19 ENCOUNTER — Ambulatory Visit (HOSPITAL_COMMUNITY)
Admission: RE | Admit: 2016-08-19 | Discharge: 2016-08-19 | Disposition: A | Discharge status: Home / Self Care | Payer: Managed Care, Other (non HMO) | Source: Ambulatory Visit | Attending: Family Medicine | Admitting: Family Medicine

## 2016-08-19 DIAGNOSIS — Z1231 Encounter for screening mammogram for malignant neoplasm of breast: Secondary | ICD-10-CM | POA: Diagnosis not present

## 2016-08-20 ENCOUNTER — Encounter: Payer: Self-pay | Admitting: Family Medicine

## 2016-08-20 ENCOUNTER — Ambulatory Visit (INDEPENDENT_AMBULATORY_CARE_PROVIDER_SITE_OTHER): Payer: Managed Care, Other (non HMO) | Admitting: Family Medicine

## 2016-08-20 VITALS — BP 124/80 | HR 61 | Resp 16 | Ht 66.0 in | Wt 191.1 lb

## 2016-08-20 DIAGNOSIS — Z0000 Encounter for general adult medical examination without abnormal findings: Secondary | ICD-10-CM

## 2016-08-20 DIAGNOSIS — E1169 Type 2 diabetes mellitus with other specified complication: Secondary | ICD-10-CM | POA: Diagnosis not present

## 2016-08-20 DIAGNOSIS — Z1211 Encounter for screening for malignant neoplasm of colon: Secondary | ICD-10-CM

## 2016-08-20 DIAGNOSIS — E038 Other specified hypothyroidism: Secondary | ICD-10-CM | POA: Diagnosis not present

## 2016-08-20 DIAGNOSIS — E785 Hyperlipidemia, unspecified: Secondary | ICD-10-CM | POA: Diagnosis not present

## 2016-08-20 DIAGNOSIS — E669 Obesity, unspecified: Secondary | ICD-10-CM

## 2016-08-20 LAB — POC HEMOCCULT BLD/STL (OFFICE/1-CARD/DIAGNOSTIC): Fecal Occult Blood, POC: NEGATIVE

## 2016-08-20 MED ORDER — METFORMIN HCL ER 500 MG PO TB24: ORAL_TABLET | ORAL | 1 refills | Status: DC

## 2016-08-20 NOTE — Progress Notes (Signed)
    Deanna Bush     MRN: [id number]      DOB: 1961/02/25  HPI: Patient is in for annual physical exam. No other health concerns are expressed or addressed at the visit. Recent labs, are reviewed. Immunization is reviewed , and  updated if needed.   PE: Pleasant  female, alert and oriented x 3, in no cardio-pulmonary distress. Afebrile. HEENT No facial trauma or asymetry. Sinuses non tender.  Extra occullar muscles intact, pupils equally reactive to light. External ears normal, tympanic membranes clear. Oropharynx moist, no exudate. Neck: supple, no adenopathy,JVD or thyromegaly.No bruits.  Chest: Clear to ascultation bilaterally.No crackles or wheezes. 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 No tenderness. No nipple discharge or inversion. No axillary or supraclavicular adenopathy  Cardiovascular system; Heart sounds normal,  S1 and  S2 ,no S3.  No murmur, or thrill. Apical beat not displaced Peripheral pulses normal.  Abdomen: Soft, non tender, no organomegaly or masses. No bruits. Bowel sounds normal. No guarding, tenderness or rebound.  Rectal:  Normal sphincter tone. No rectal mass. Guaiac negative stool.  GU: External genitalia normal female genitalia , normal female distribution of hair. No lesions. Urethral meatus normal in size, no  Prolapse, no lesions visibly  Present. Bladder non tender. Vagina pink and moist , with no visible lesions , discharge present . Adequate pelvic support no  cystocele or rectocele noted Cervix pink and appears healthy, no lesions or ulcerations noted, no discharge noted from os Uterus normal size, no adnexal masses, no cervical motion or adnexal tenderness.   Musculoskeletal exam: Full ROM of spine, hips , shoulders and knees. No deformity ,swelling or crepitus noted. No muscle wasting or atrophy.   Neurologic: Cranial nerves 2 to 12 intact. Power, tone ,sensation and reflexes normal  throughout. No disturbance in gait. No tremor.  Skin: Intact, no ulceration, erythema , scaling or rash noted. Pigmentation normal throughout  Psych; Normal mood and affect. Judgement and concentration normal   Assessment & Plan:  Annual physical exam Annual exam as documented. Counseling done  re healthy lifestyle involving commitment to 150 minutes exercise per week, heart healthy diet, and attaining healthy weight.The importance of adequate sleep also discussed.  Changes in health habits are decided on by the patient with goals and time frames  set for achieving them. Immunization and cancer screening needs are specifically addressed at this visit.

## 2016-08-20 NOTE — Patient Instructions (Signed)
F/u in 4 month, call if you need me sooner  TSH in 8 weeks  New correct dose of synthroid is 75 mcg daily  Please work on good  health habits so that your health will improve. 1. Commitment to daily physical activity for 30 to 60  minutes, if you are able to do this.  2. Commitment to wise food choices. Aim for half of your  food intake to be vegetable and fruit, one quarter starchy foods, and one quarter protein. Try to eat on a regular schedule  3 meals per day, snacking between meals should be limited to vegetables or fruits or small portions of nuts. 64 ounces of water per day is generally recommended, unless you have specific health conditions, like heart failure or kidney failure where you will need to limit fluid intake.  3. Commitment to sufficient and a  good quality of physical and mental rest daily, generally between 6 to 8 hours per day.  WITH PERSISTANCE AND PERSEVERANCE, THE IMPOSSIBLE , BECOMES THE NORM!  HBA1C, chem7 and eGFR and tSH in 4 month

## 2016-08-21 NOTE — Assessment & Plan Note (Signed)
Annual exam as documented. Counseling done  re healthy lifestyle involving commitment to 150 minutes exercise per week, heart healthy diet, and attaining healthy weight.The importance of adequate sleep also discussed. Changes in health habits are decided on by the patient with goals and time frames  set for achieving them. Immunization and cancer screening needs are specifically addressed at this visit. 

## 2016-09-05 ENCOUNTER — Telehealth: Payer: Self-pay | Admitting: Family Medicine

## 2016-09-05 NOTE — Telephone Encounter (Signed)
PLEASE SEND 90 DAY SUPPLY OF levothyroxine (SYNTHROID, LEVOTHROID) 75 MCG tablet  TO [facility]

## 2016-09-06 ENCOUNTER — Other Ambulatory Visit: Payer: Self-pay

## 2016-09-06 MED ORDER — LEVOTHYROXINE SODIUM 75 MCG PO TABS: 75.0000 ug | ORAL_TABLET | Freq: Every day | ORAL | 1 refills | Status: DC

## 2016-09-06 NOTE — Telephone Encounter (Signed)
Med sent.

## 2016-09-11 ENCOUNTER — Telehealth: Payer: Self-pay

## 2016-09-11 DIAGNOSIS — R42 Dizziness and giddiness: Secondary | ICD-10-CM

## 2016-09-11 MED ORDER — MECLIZINE HCL 25 MG PO TABS: 25.0000 mg | ORAL_TABLET | Freq: Three times a day (TID) | ORAL | 0 refills | Status: DC | PRN

## 2016-09-11 NOTE — Telephone Encounter (Signed)
Noted, thanks!

## 2016-11-30 LAB — BASIC METABOLIC PANEL
BUN: 14 mg/dL (ref 7–25)
CO2: 29 mmol/L (ref 20–31)
Calcium: 9.4 mg/dL (ref 8.6–10.4)
Chloride: 105 mmol/L (ref 98–110)
Creat: 0.81 mg/dL (ref 0.50–1.05)
Glucose, Bld: 90 mg/dL (ref 65–99)
Potassium: 4.5 mmol/L (ref 3.5–5.3)
Sodium: 141 mmol/L (ref 135–146)

## 2016-11-30 LAB — TSH: TSH: 2.68 mIU/L

## 2016-11-30 LAB — HEMOGLOBIN A1C
Hgb A1c MFr Bld: 6.1 % — ABNORMAL HIGH (ref <5.7)
MEAN PLASMA GLUCOSE: 128 mg/dL

## 2016-12-12 ENCOUNTER — Ambulatory Visit (INDEPENDENT_AMBULATORY_CARE_PROVIDER_SITE_OTHER): Payer: Managed Care, Other (non HMO) | Admitting: Otolaryngology

## 2016-12-12 DIAGNOSIS — R42 Dizziness and giddiness: Secondary | ICD-10-CM

## 2016-12-12 DIAGNOSIS — H903 Sensorineural hearing loss, bilateral: Secondary | ICD-10-CM

## 2016-12-18 ENCOUNTER — Encounter: Payer: Self-pay | Admitting: Family Medicine

## 2016-12-18 ENCOUNTER — Ambulatory Visit (INDEPENDENT_AMBULATORY_CARE_PROVIDER_SITE_OTHER): Payer: Managed Care, Other (non HMO) | Admitting: Family Medicine

## 2016-12-18 VITALS — BP 144/82 | HR 60 | Resp 15 | Ht 66.0 in | Wt 191.0 lb

## 2016-12-18 DIAGNOSIS — E785 Hyperlipidemia, unspecified: Secondary | ICD-10-CM

## 2016-12-18 DIAGNOSIS — E1169 Type 2 diabetes mellitus with other specified complication: Secondary | ICD-10-CM

## 2016-12-18 DIAGNOSIS — E038 Other specified hypothyroidism: Secondary | ICD-10-CM

## 2016-12-18 DIAGNOSIS — E669 Obesity, unspecified: Secondary | ICD-10-CM

## 2016-12-18 DIAGNOSIS — E559 Vitamin D deficiency, unspecified: Secondary | ICD-10-CM

## 2016-12-18 NOTE — Patient Instructions (Addendum)
F/u in end August , call if you need me before \   Fasting lipid, hepatic , hBA1C and tSH and vit D for next visit  It is important that you exercise regularly at least 30 minutes 5 times a week. If you develop chest pain, have severe difficulty breathing, or feel very tired, stop exercising immediately and seek medical attention    Please work on good  health habits so that your health will improve. 1. Commitment to daily physical activity for 30 to 60  minutes, if you are able to do this.  2. Commitment to wise food choices. Aim for half of your  food intake to be vegetable and fruit, one quarter starchy foods, and one quarter protein. Try to eat on a regular schedule  3 meals per day, snacking between meals should be limited to vegetables or fruits or small portions of nuts. 64 ounces of water per day is generally recommended, unless you have specific health conditions, like heart failure or kidney failure where you will need to limit fluid intake.  3. Commitment to sufficient and a  good quality of physical and mental rest daily, generally between 6 to 8 hours per day.  WITH PERSISTANCE AND PERSEVERANCE, THE IMPOSSIBLE , BECOMES THE NORM!    DASH Eating Plan DASH stands for "Dietary Approaches to Stop Hypertension." The DASH eating plan is a healthy eating plan that has been shown to reduce high blood pressure (hypertension). It may also reduce your risk for type 2 diabetes, heart disease, and stroke. The DASH eating plan may also help with weight loss. What are tips for following this plan? General guidelines   Avoid eating more than 2,300 mg (milligrams) of salt (sodium) a day. If you have hypertension, you may need to reduce your sodium intake to 1,500 mg a day.  Limit alcohol intake to no more than 1 drink a day for nonpregnant women and 2 drinks a day for men. One drink equals 12 oz of beer, 5 oz of wine, or 1 oz of hard liquor.  Work with your health care provider to  maintain a healthy body weight or to lose weight. Ask what an ideal weight is for you.  Get at least 30 minutes of exercise that causes your heart to beat faster (aerobic exercise) most days of the week. Activities may include walking, swimming, or biking.  Work with your health care provider or diet and nutrition specialist (dietitian) to adjust your eating plan to your individual calorie needs. Reading food labels   Check food labels for the amount of sodium per serving. Choose foods with less than 5 percent of the Daily Value of sodium. Generally, foods with less than 300 mg of sodium per serving fit into this eating plan.  To find whole grains, look for the word "whole" as the first word in the ingredient list. Shopping   Buy products labeled as "low-sodium" or "no salt added."  Buy fresh foods. Avoid canned foods and premade or frozen meals. Cooking   Avoid adding salt when cooking. Use salt-free seasonings or herbs instead of table salt or sea salt. Check with your health care provider or pharmacist before using salt substitutes.  Do not fry foods. Cook foods using healthy methods such as baking, boiling, grilling, and broiling instead.  Cook with heart-healthy oils, such as olive, canola, soybean, or sunflower oil. Meal planning    Eat a balanced diet that includes:  5 or more servings of fruits and vegetables  each day. At each meal, try to fill half of your plate with fruits and vegetables.  Up to 6-8 servings of whole grains each day.  Less than 6 oz of lean meat, poultry, or fish each day. A 3-oz serving of meat is about the same size as a deck of cards. One egg equals 1 oz.  2 servings of low-fat dairy each day.  A serving of nuts, seeds, or beans 5 times each week.  Heart-healthy fats. Healthy fats called Omega-3 fatty acids are found in foods such as flaxseeds and coldwater fish, like sardines, salmon, and mackerel.  Limit how much you eat of the  following:  Canned or prepackaged foods.  Food that is high in trans fat, such as fried foods.  Food that is high in saturated fat, such as fatty meat.  Sweets, desserts, sugary drinks, and other foods with added sugar.  Full-fat dairy products.  Do not salt foods before eating.  Try to eat at least 2 vegetarian meals each week.  Eat more home-cooked food and less restaurant, buffet, and fast food.  When eating at a restaurant, ask that your food be prepared with less salt or no salt, if possible. What foods are recommended? The items listed may not be a complete list. Talk with your dietitian about what dietary choices are best for you. Grains  Whole-grain or whole-wheat bread. Whole-grain or whole-wheat pasta. Brown rice. [place]. Bulgur. Whole-grain and low-sodium cereals. Pita bread. Low-fat, low-sodium crackers. Whole-wheat flour tortillas. Vegetables  Fresh or frozen vegetables (raw, steamed, roasted, or grilled). Low-sodium or reduced-sodium tomato and vegetable juice. Low-sodium or reduced-sodium tomato sauce and tomato paste. Low-sodium or reduced-sodium canned vegetables. Fruits  All fresh, dried, or frozen fruit. Canned fruit in natural juice (without added sugar). Meat and other protein foods  Skinless chicken or [place]. Ground chicken or [place]. Pork with fat trimmed off. Fish and seafood. Egg whites. Dried beans, peas, or lentils. Unsalted nuts, nut butters, and seeds. Unsalted canned beans. Lean cuts of beef with fat trimmed off. Low-sodium, lean deli meat. Dairy  Low-fat (1%) or fat-free (skim) milk. Fat-free, low-fat, or reduced-fat cheeses. Nonfat, low-sodium ricotta or cottage cheese. Low-fat or nonfat yogurt. Low-fat, low-sodium cheese. Fats and oils  Soft margarine without trans fats. Vegetable oil. Low-fat, reduced-fat, or light mayonnaise and salad dressings (reduced-sodium). Canola, safflower, olive, soybean, and sunflower oils. Avocado. Seasoning and  other foods  Herbs. Spices. Seasoning mixes without salt. Unsalted popcorn and pretzels. Fat-free sweets. What foods are not recommended? The items listed may not be a complete list. Talk with your dietitian about what dietary choices are best for you. Grains  Baked goods made with fat, such as croissants, muffins, or some breads. Dry pasta or rice meal packs. Vegetables  Creamed or fried vegetables. Vegetables in a cheese sauce. Regular canned vegetables (not low-sodium or reduced-sodium). Regular canned tomato sauce and paste (not low-sodium or reduced-sodium). Regular tomato and vegetable juice (not low-sodium or reduced-sodium). Angie Fava. Olives. Fruits  Canned fruit in a light or heavy syrup. Fried fruit. Fruit in cream or butter sauce. Meat and other protein foods  Fatty cuts of meat. Ribs. Fried meat. Berniece Salines. Sausage. Bologna and other processed lunch meats. Salami. Fatback. Hotdogs. Bratwurst. Salted nuts and seeds. Canned beans with added salt. Canned or smoked fish. Whole eggs or egg yolks. Chicken or [place] with skin. Dairy  Whole or 2% milk, cream, and half-and-half. Whole or full-fat cream cheese. Whole-fat or sweetened yogurt. Full-fat cheese. Nondairy  creamers. Whipped toppings. Processed cheese and cheese spreads. Fats and oils  Butter. Stick margarine. Lard. Shortening. Ghee. Bacon fat. Tropical oils, such as coconut, palm kernel, or palm oil. Seasoning and other foods  Salted popcorn and pretzels. Onion salt, garlic salt, seasoned salt, table salt, and sea salt. Worcestershire sauce. Tartar sauce. Barbecue sauce. Teriyaki sauce. Soy sauce, including reduced-sodium. Steak sauce. Canned and packaged gravies. Fish sauce. Oyster sauce. Cocktail sauce. Horseradish that you find on the shelf. Ketchup. Mustard. Meat flavorings and tenderizers. Bouillon cubes. Hot sauce and Tabasco sauce. Premade or packaged marinades. Premade or packaged taco seasonings. Relishes. Regular salad  dressings. Where to find more information:  National Heart, Lung, and [place]: https://wilson-eaton.com/  American Heart Association: www.heart.org Summary  The DASH eating plan is a healthy eating plan that has been shown to reduce high blood pressure (hypertension). It may also reduce your risk for type 2 diabetes, heart disease, and stroke.  With the DASH eating plan, you should limit salt (sodium) intake to 2,300 mg a day. If you have hypertension, you may need to reduce your sodium intake to 1,500 mg a day.  When on the DASH eating plan, aim to eat more fresh fruits and vegetables, whole grains, lean proteins, low-fat dairy, and heart-healthy fats.  Work with your health care provider or diet and nutrition specialist (dietitian) to adjust your eating plan to your individual calorie needs. This information is not intended to replace advice given to you by your health care provider. Make sure you discuss any questions you have with your health care provider. Document Released: 09/12/2011 Document Revised: 09/16/2016 Document Reviewed: 09/16/2016 Elsevier Interactive Patient Education  2017 [employer].

## 2016-12-20 ENCOUNTER — Encounter: Payer: Self-pay | Admitting: Family Medicine

## 2016-12-20 NOTE — Assessment & Plan Note (Signed)
Controlled, no change in medication Deanna Bush is reminded of the importance of commitment to daily physical activity for 30 minutes or more, as able and the need to limit carbohydrate intake to 30 to 60 grams per meal to help with blood sugar control.   The need to take medication as prescribed, test blood sugar as directed, and to call between visits if there is a concern that blood sugar is uncontrolled is also discussed.   Deanna Bush is reminded of the importance of daily foot exam, annual eye examination, and good blood sugar, blood pressure and cholesterol control.  Diabetic Labs Latest Ref Rng & Units 11/29/2016 08/12/2016 04/03/2016 11/29/2015 11/24/2015  HbA1c <5.7 % 6.1(H) 6.0(H) - 6.3(H) 6.2(H)  Microalbumin Not Estab. ug/mL - - <3.0(H) - -  Micro/Creat Ratio 0.0 - 30.0 mg/g creat - - <6.2 - -  Chol <200 mg/dL - 159 - - 177  HDL >50 mg/dL - 70 - - 70  Calc LDL mg/dL - 82 - - 95  Triglycerides <150 mg/dL - 37 - - 60  Creatinine 0.50 - 1.05 mg/dL 0.81 0.73 - 0.75 0.76   BP/Weight 12/18/2016 08/20/2016 04/02/2016 11/29/2015 07/26/2015 05/24/2015 9/60/4540  Systolic BP 981 191 478 295 621 - 308  Diastolic BP 82 80 72 78 78 - 73  Wt. (Lbs) 191 191.12 188 188 186 190 193  BMI 30.83 30.85 30.36 30.36 30.04 30.68 31.17   Foot/eye exam completion dates Latest Ref Rng & Units 04/02/2016 10/11/2015  Eye Exam No Retinopathy - No Retinopathy  Foot Form Completion - Done -

## 2016-12-20 NOTE — Assessment & Plan Note (Signed)
Unchanged Patient re-educated about  the importance of commitment to a  minimum of 150 minutes of exercise per week.  The importance of healthy food choices with portion control discussed. Encouraged to start a food diary, count calories and to consider  joining a support group. Sample diet sheets offered. Goals set by the patient for the next several months.   Weight /BMI 12/18/2016 08/20/2016 04/02/2016  WEIGHT 191 lb 191 lb 1.9 oz 188 lb  HEIGHT 5\' 6"  5\' 6"  5\' 6"   BMI 30.83 kg/m2 30.85 kg/m2 30.36 kg/m2

## 2016-12-20 NOTE — Assessment & Plan Note (Signed)
Hyperlipidemia:Low fat diet discussed and encouraged.   Lipid Panel  Lab Results  Component Value Date   CHOL 159 08/12/2016   HDL 70 08/12/2016   LDLCALC 82 08/12/2016   TRIG 37 08/12/2016   CHOLHDL 2.3 08/12/2016   Updated lab needed at/ before next visit.

## 2016-12-20 NOTE — Assessment & Plan Note (Signed)
Controlled, no change in medication Updated lab needed at/ before next visit.  

## 2016-12-20 NOTE — Progress Notes (Signed)
Deanna Bush     MRN: [id number]      DOB: October 02, 1961   HPI Deanna Bush is here for follow up and re-evaluation of chronic medical conditions, medication management and review of any available recent lab and radiology data.  Preventive health is updated, specifically  Cancer screening and Immunization.   Questions or concerns regarding consultations or procedures which the PT has had in the interim are  addressed. The PT denies any adverse reactions to current medications since the last visit.  There are no new concerns.  There are no specific complaints  Denies polyuria, polydipsia, blurred vision , or hypoglycemic episodes.   ROS Denies recent fever or chills. Denies sinus pressure, nasal congestion, ear pain or sore throat. Denies chest congestion, productive cough or wheezing. Denies chest pains, palpitations and leg swelling Denies abdominal pain, nausea, vomiting,diarrhea or constipation.   Denies dysuria, frequency, hesitancy or incontinence. Denies joint pain, swelling and limitation in mobility. Denies headaches, seizures, numbness, or tingling. Denies depression, anxiety or insomnia. Denies skin break down or rash.   PE  BP (!) 144/82   Pulse 60   Resp 15   Ht 5\' 6"  (1.676 m)   Wt 191 lb (86.6 kg)   SpO2 97%   BMI 30.83 kg/m   Patient alert and oriented and in no cardiopulmonary distress.  HEENT: No facial asymmetry, EOMI,   oropharynx pink and moist.  Neck supple no JVD, no mass.  Chest: Clear to auscultation bilaterally.  CVS: S1, S2 no murmurs, no S3.Regular rate.  ABD: Soft non tender.   Ext: No edema  MS: Adequate ROM spine, shoulders, hips and knees.  Skin: Intact, no ulcerations or rash noted.  Psych: Good eye contact, normal affect. Memory intact not anxious or depressed appearing.  CNS: CN 2-12 intact, power,  normal throughout.no focal deficits noted.   Assessment & Plan  Diabetes mellitus type 2 in obese Controlled, no change in  medication Deanna Bush is reminded of the importance of commitment to daily physical activity for 30 minutes or more, as able and the need to limit carbohydrate intake to 30 to 60 grams per meal to help with blood sugar control.   The need to take medication as prescribed, test blood sugar as directed, and to call between visits if there is a concern that blood sugar is uncontrolled is also discussed.   Deanna Bush is reminded of the importance of daily foot exam, annual eye examination, and good blood sugar, blood pressure and cholesterol control.  Diabetic Labs Latest Ref Rng & Units 11/29/2016 08/12/2016 04/03/2016 11/29/2015 11/24/2015  HbA1c <5.7 % 6.1(H) 6.0(H) - 6.3(H) 6.2(H)  Microalbumin Not Estab. ug/mL - - <3.0(H) - -  Micro/Creat Ratio 0.0 - 30.0 mg/g creat - - <6.2 - -  Chol <200 mg/dL - 159 - - 177  HDL >50 mg/dL - 70 - - 70  Calc LDL mg/dL - 82 - - 95  Triglycerides <150 mg/dL - 37 - - 60  Creatinine 0.50 - 1.05 mg/dL 0.81 0.73 - 0.75 0.76   BP/Weight 12/18/2016 08/20/2016 04/02/2016 11/29/2015 07/26/2015 05/24/2015 2/29/7989  Systolic BP 211 941 740 814 481 - 856  Diastolic BP 82 80 72 78 78 - 73  Wt. (Lbs) 191 191.12 188 188 186 190 193  BMI 30.83 30.85 30.36 30.36 30.04 30.68 31.17   Foot/eye exam completion dates Latest Ref Rng & Units 04/02/2016 10/11/2015  Eye Exam No Retinopathy - No Retinopathy  Foot Form Completion -  Done -        Hypothyroidism Controlled, no change in medication Updated lab needed at/ before next visit.   Obesity (BMI 30.0-34.9) Unchanged Patient re-educated about  the importance of commitment to a  minimum of 150 minutes of exercise per week.  The importance of healthy food choices with portion control discussed. Encouraged to start a food diary, count calories and to consider  joining a support group. Sample diet sheets offered. Goals set by the patient for the next several months.   Weight /BMI 12/18/2016 08/20/2016 04/02/2016  WEIGHT 191 lb 191  lb 1.9 oz 188 lb  HEIGHT 5\' 6"  5\' 6"  5\' 6"   BMI 30.83 kg/m2 30.85 kg/m2 30.36 kg/m2      Hyperlipidemia with target LDL less than 100 Hyperlipidemia:Low fat diet discussed and encouraged.   Lipid Panel  Lab Results  Component Value Date   CHOL 159 08/12/2016   HDL 70 08/12/2016   LDLCALC 82 08/12/2016   TRIG 37 08/12/2016   CHOLHDL 2.3 08/12/2016   Updated lab needed at/ before next visit.

## 2016-12-23 ENCOUNTER — Other Ambulatory Visit: Payer: Self-pay | Admitting: Family Medicine

## 2016-12-24 ENCOUNTER — Other Ambulatory Visit: Payer: Self-pay

## 2016-12-24 ENCOUNTER — Telehealth: Payer: Self-pay | Admitting: Family Medicine

## 2016-12-24 MED ORDER — LEVOTHYROXINE SODIUM 75 MCG PO TABS: 75.0000 ug | ORAL_TABLET | Freq: Every day | ORAL | 1 refills | Status: DC

## 2016-12-24 MED ORDER — METFORMIN HCL ER 500 MG PO TB24: ORAL_TABLET | ORAL | 1 refills | Status: DC

## 2016-12-24 MED ORDER — ATORVASTATIN CALCIUM 10 MG PO TABS: 10.0000 mg | ORAL_TABLET | Freq: Every day | ORAL | 1 refills | Status: DC

## 2016-12-24 NOTE — Telephone Encounter (Signed)
Patient is requesting a refill for thyroid meds and cholesterol meds be faxed to : [facility]   cb#: [phone]

## 2016-12-24 NOTE — Telephone Encounter (Signed)
Refilled

## 2017-03-19 ENCOUNTER — Ambulatory Visit (INDEPENDENT_AMBULATORY_CARE_PROVIDER_SITE_OTHER): Payer: PRIVATE HEALTH INSURANCE

## 2017-03-19 DIAGNOSIS — N3001 Acute cystitis with hematuria: Secondary | ICD-10-CM | POA: Diagnosis not present

## 2017-03-19 LAB — POCT URINALYSIS DIPSTICK
Bilirubin, UA: NEGATIVE
Clarity, UA: NEGATIVE
Glucose, UA: NEGATIVE
KETONES UA: NEGATIVE
NITRITE UA: NEGATIVE
PH UA: 7 (ref 5.0–8.0)
PROTEIN UA: NEGATIVE
Spec Grav, UA: 1.015 (ref 1.010–1.025)
Urobilinogen, UA: 0.2 E.U./dL

## 2017-03-19 MED ORDER — LEVOTHYROXINE SODIUM 75 MCG PO TABS: 75.0000 ug | ORAL_TABLET | Freq: Every day | ORAL | 1 refills | Status: DC

## 2017-03-19 MED ORDER — ATORVASTATIN CALCIUM 10 MG PO TABS: 10.0000 mg | ORAL_TABLET | Freq: Every day | ORAL | 1 refills | Status: DC

## 2017-03-19 MED ORDER — FLUCONAZOLE 150 MG PO TABS: 150.0000 mg | ORAL_TABLET | Freq: Every day | ORAL | 0 refills | Status: DC

## 2017-03-19 MED ORDER — CIPROFLOXACIN HCL 500 MG PO TABS: 500.0000 mg | ORAL_TABLET | Freq: Two times a day (BID) | ORAL | 0 refills | Status: DC

## 2017-03-19 MED ORDER — METFORMIN HCL ER 500 MG PO TB24: ORAL_TABLET | ORAL | 1 refills | Status: DC

## 2017-03-19 NOTE — Progress Notes (Signed)
uti- dysuria x 3 days and med sent and urine sent for culture

## 2017-03-20 ENCOUNTER — Other Ambulatory Visit: Payer: Self-pay

## 2017-03-20 MED ORDER — LEVOTHYROXINE SODIUM 75 MCG PO TABS: 75.0000 ug | ORAL_TABLET | Freq: Every day | ORAL | 1 refills | Status: DC

## 2017-03-20 MED ORDER — ATORVASTATIN CALCIUM 10 MG PO TABS: 10.0000 mg | ORAL_TABLET | Freq: Every day | ORAL | 1 refills | Status: DC

## 2017-03-20 MED ORDER — METFORMIN HCL ER 500 MG PO TB24: ORAL_TABLET | ORAL | 1 refills | Status: DC

## 2017-03-22 LAB — URINE CULTURE

## 2017-03-25 ENCOUNTER — Telehealth: Payer: Self-pay

## 2017-03-25 NOTE — Telephone Encounter (Signed)
Needs rx to [facility] in [place] for metformin and theroid - call pt if questions

## 2017-03-26 ENCOUNTER — Other Ambulatory Visit: Payer: Self-pay

## 2017-03-26 MED ORDER — METFORMIN HCL ER 500 MG PO TB24: ORAL_TABLET | ORAL | 1 refills | Status: DC

## 2017-03-26 MED ORDER — LEVOTHYROXINE SODIUM 75 MCG PO TABS: 75.0000 ug | ORAL_TABLET | Freq: Every day | ORAL | 1 refills | Status: DC

## 2017-03-26 NOTE — Telephone Encounter (Signed)
Sent in

## 2017-05-05 ENCOUNTER — Telehealth: Payer: Self-pay | Admitting: *Deleted

## 2017-05-05 NOTE — Telephone Encounter (Signed)
Patient walked in requesting a prescription for levothyroxine. Please advise [phone] patient about refill.

## 2017-05-06 ENCOUNTER — Other Ambulatory Visit: Payer: Self-pay

## 2017-05-06 NOTE — Telephone Encounter (Signed)
Has refills at [facility]. Advised to call back and leave message on where she is needing it sent to

## 2017-05-06 NOTE — Telephone Encounter (Signed)
Patient called and left message on nurse line asking for rx for prosthesis bra. Callback# [phone]

## 2017-05-09 ENCOUNTER — Other Ambulatory Visit: Payer: Self-pay

## 2017-05-09 MED ORDER — UNABLE TO FIND: 2 refills | Status: DC

## 2017-05-09 NOTE — Telephone Encounter (Signed)
AWARE THAT SHE COULD COME COLLECT WHEN READY

## 2017-06-05 ENCOUNTER — Ambulatory Visit: Payer: Managed Care, Other (non HMO) | Admitting: Family Medicine

## 2017-06-10 ENCOUNTER — Telehealth: Payer: Self-pay | Admitting: Family Medicine

## 2017-06-10 NOTE — Telephone Encounter (Signed)
Pls order #6 mastectomy breast prosthesis, se order in your box, Iwill be addending most recent oV o support need, thanks

## 2017-06-10 NOTE — Assessment & Plan Note (Signed)
S/p ;modified radical left mastectomy in 2003, rewires breast prosthesese annually ^ as well as mastectomy bras, these will be ordered when due and should be covered by patient's insurance

## 2017-06-11 MED ORDER — UNABLE TO FIND: 0 refills | Status: DC

## 2017-06-16 LAB — COMPLETE METABOLIC PANEL WITH GFR
AG Ratio: 1.4 (calc) (ref 1.0–2.5)
ALKALINE PHOSPHATASE (APISO): 64 U/L (ref 33–130)
ALT: 14 U/L (ref 6–29)
AST: 15 U/L (ref 10–35)
Albumin: 4.1 g/dL (ref 3.6–5.1)
BILIRUBIN TOTAL: 0.3 mg/dL (ref 0.2–1.2)
BUN: 16 mg/dL (ref 7–25)
CHLORIDE: 105 mmol/L (ref 98–110)
CO2: 29 mmol/L (ref 20–32)
Calcium: 9.4 mg/dL (ref 8.6–10.4)
Creat: 0.92 mg/dL (ref 0.50–1.05)
GFR, Est African American: 80 mL/min/{1.73_m2} (ref >=60)
GFR, Est Non African American: 69 mL/min/{1.73_m2} (ref >=60)
GLUCOSE: 97 mg/dL (ref 65–99)
Globulin: 2.9 g/dL (calc) (ref 1.9–3.7)
POTASSIUM: 4.5 mmol/L (ref 3.5–5.3)
Sodium: 140 mmol/L (ref 135–146)
Total Protein: 7 g/dL (ref 6.1–8.1)

## 2017-06-16 LAB — LIPID PANEL
CHOL/HDL RATIO: 2.4 (calc) (ref <5.0)
Cholesterol: 168 mg/dL (ref <200)
HDL: 70 mg/dL (ref >50)
LDL Cholesterol (Calc): 86 mg/dL (calc)
NON-HDL CHOLESTEROL (CALC): 98 mg/dL (ref <130)
TRIGLYCERIDES: 50 mg/dL (ref <150)

## 2017-06-16 LAB — HEMOGLOBIN A1C
EAG (MMOL/L): 7.1 (calc)
Hgb A1c MFr Bld: 6.1 % of total Hgb — ABNORMAL HIGH (ref <5.7)
MEAN PLASMA GLUCOSE: 128 (calc)

## 2017-06-16 LAB — VITAMIN D 25 HYDROXY (VIT D DEFICIENCY, FRACTURES): VIT D 25 HYDROXY: 27 ng/mL — AB (ref 30–100)

## 2017-06-16 LAB — TSH: TSH: 7.96 mIU/L — ABNORMAL HIGH (ref 0.40–4.50)

## 2017-06-17 ENCOUNTER — Ambulatory Visit (INDEPENDENT_AMBULATORY_CARE_PROVIDER_SITE_OTHER): Payer: PRIVATE HEALTH INSURANCE | Admitting: Family Medicine

## 2017-06-17 VITALS — BP 118/78 | HR 65 | Temp 97.7°F | Resp 16 | Ht 66.0 in | Wt 193.0 lb

## 2017-06-17 DIAGNOSIS — E038 Other specified hypothyroidism: Secondary | ICD-10-CM

## 2017-06-17 DIAGNOSIS — E785 Hyperlipidemia, unspecified: Secondary | ICD-10-CM

## 2017-06-17 DIAGNOSIS — E669 Obesity, unspecified: Secondary | ICD-10-CM

## 2017-06-17 DIAGNOSIS — E119 Type 2 diabetes mellitus without complications: Secondary | ICD-10-CM

## 2017-06-17 DIAGNOSIS — E559 Vitamin D deficiency, unspecified: Secondary | ICD-10-CM | POA: Diagnosis not present

## 2017-06-17 DIAGNOSIS — E1169 Type 2 diabetes mellitus with other specified complication: Secondary | ICD-10-CM | POA: Diagnosis not present

## 2017-06-17 MED ORDER — LEVOTHYROXINE SODIUM 100 MCG PO TABS: 100.0000 ug | ORAL_TABLET | Freq: Every day | ORAL | 3 refills | Status: DC

## 2017-06-17 MED ORDER — ATORVASTATIN CALCIUM 10 MG PO TABS: 10.0000 mg | ORAL_TABLET | Freq: Every day | ORAL | 1 refills | Status: DC

## 2017-06-17 NOTE — Patient Instructions (Addendum)
Physical and pap in January , call if you need me before   TSH,CBC,HBA1C, cmp and EGFr non fast 1 week before next visit  Increase thyroid  med dose to 100 mcg daily  Stop metformin  Start oTC vitamin D3 2000 IU once daily  It is important that you exercise regularly at least 30 minutes 5 times a week. If you develop chest pain, have severe difficulty breathing, or feel very tired, stop exercising immediately and seek medical attention    Please work on good  health habits so that your health will improve. 1. Commitment to daily physical activity for 30 to 60  minutes, if you are able to do this.  2. Commitment to wise food choices. Aim for half of your  food intake to be vegetable and fruit, one quarter starchy foods, and one quarter protein. Try to eat on a regular schedule  3 meals per day, snacking between meals should be limited to vegetables or fruits or small portions of nuts. 64 ounces of water per day is generally recommended, unless you have specific health conditions, like heart failure or kidney failure where you will need to limit fluid intake.  3. Commitment to sufficient and a  good quality of physical and mental rest daily, generally between 6 to 8 hours per day.  WITH PERSISTANCE AND PERSEVERANCE, THE IMPOSSIBLE , BECOMES THE NORM!   Good foot exam today

## 2017-06-18 LAB — MICROALBUMIN / CREATININE URINE RATIO: Creatinine, Urine: 43 mg/dL (ref 20–275)

## 2017-06-22 ENCOUNTER — Encounter: Payer: Self-pay | Admitting: Family Medicine

## 2017-06-22 NOTE — Assessment & Plan Note (Signed)
Deteriorated. Patient re-educated about  the importance of commitment to a  minimum of 150 minutes of exercise per week.  The importance of healthy food choices with portion control discussed. Encouraged to start a food diary, count calories and to consider  joining a support group. Sample diet sheets offered. Goals set by the patient for the next several months.   Weight /BMI 06/17/2017 12/18/2016 08/20/2016  WEIGHT 193 lb 191 lb 191 lb 1.9 oz  HEIGHT 5\' 6"  5\' 6"  5\' 6"   BMI 31.15 kg/m2 30.83 kg/m2 30.85 kg/m2

## 2017-06-22 NOTE — Assessment & Plan Note (Signed)
Controlled, no change in medication Hyperlipidemia:Low fat diet discussed and encouraged.   Lipid Panel  Lab Results  Component Value Date   CHOL 168 06/14/2017   HDL 70 06/14/2017   LDLCALC 82 08/12/2016   TRIG 50 06/14/2017   CHOLHDL 2.4 06/14/2017

## 2017-06-22 NOTE — Assessment & Plan Note (Signed)
Needs OTC vit D3 , 2000 IU once daily

## 2017-06-22 NOTE — Progress Notes (Signed)
Deanna Bush     MRN: [id number]      DOB: 09/13/1961   HPI Deanna Bush is here for follow up and re-evaluation of chronic medical conditions, medication management and review of any available recent lab and radiology data.  Preventive health is updated, specifically  Cancer screening and Immunization.   Questions or concerns regarding consultations or procedures which the PT has had in the interim are  addressed. The PT denies any adverse reactions to current medications since the last visit.  There are no new concerns.  There are no specific complaints   ROS Denies recent fever or chills. Denies sinus pressure, nasal congestion, ear pain or sore throat. Denies chest congestion, productive cough or wheezing. Denies chest pains, palpitations and leg swelling Denies abdominal pain, nausea, vomiting,diarrhea or constipation.   Denies dysuria, frequency, hesitancy or incontinence. Denies joint pain, swelling and limitation in mobility. Denies headaches, seizures, numbness, or tingling. Denies depression, anxiety or insomnia. Denies skin break down or rash.   PE  BP 118/78 (BP Location: Left Arm, Patient Position: Sitting, Cuff Size: Normal)   Pulse 65   Temp 97.7 F (36.5 C) (Other (Comment))   Resp 16   Ht 5\' 6"  (1.676 m)   Wt 193 lb (87.5 kg)   SpO2 98%   BMI 31.15 kg/m   Patient alert and oriented and in no cardiopulmonary distress.  HEENT: No facial asymmetry, EOMI,   oropharynx pink and moist.  Neck supple no JVD, no mass.  Chest: Clear to auscultation bilaterally.  CVS: S1, S2 no murmurs, no S3.Regular rate.  ABD: Soft non tender.   Ext: No edema  MS: Adequate ROM spine, shoulders, hips and knees.  Skin: Intact, no ulcerations or rash noted.  Psych: Good eye contact, normal affect. Memory intact not anxious or depressed appearing.  CNS: CN 2-12 intact, power,  normal throughout.no focal deficits noted.   Assessment & Plan  Hypothyroidism Under  corrected, dose increase in synthroid  Hyperlipidemia with target LDL less than 100 Controlled, no change in medication Hyperlipidemia:Low fat diet discussed and encouraged.   Lipid Panel  Lab Results  Component Value Date   CHOL 168 06/14/2017   HDL 70 06/14/2017   LDLCALC 82 08/12/2016   TRIG 50 06/14/2017   CHOLHDL 2.4 06/14/2017       Diabetes mellitus type 2 in obese [facility]) May discontinue metformin Deanna Bush is reminded of the importance of commitment to daily physical activity for 30 minutes or more, as able and the need to limit carbohydrate intake to 30 to 60 grams per meal to help with blood sugar control.   The need to take medication as prescribed, test blood sugar as directed, and to call between visits if there is a concern that blood sugar is uncontrolled is also discussed.   Deanna Bush is reminded of the importance of daily foot exam, annual eye examination, and good blood sugar, blood pressure and cholesterol control.  Diabetic Labs Latest Ref Rng & Units 06/17/2017 06/14/2017 11/29/2016 08/12/2016 04/03/2016  HbA1c <5.7 % of total Hgb - 6.1(H) 6.1(H) 6.0(H) -  Microalbumin mg/dL <0.2 - - - <3.0(H)  Micro/Creat Ratio <30 mcg/mg creat NOTE - - - <6.2  Chol <200 mg/dL - 168 - 159 -  HDL >50 mg/dL - 70 - 70 -  Calc LDL mg/dL - - - 82 -  Triglycerides <150 mg/dL - 50 - 37 -  Creatinine 0.50 - 1.05 mg/dL - 0.92 0.81 0.73 -  BP/Weight 06/17/2017 12/18/2016 08/20/2016 04/02/2016 11/29/2015 07/26/2015 0/30/1314  Systolic BP 388 875 797 282 060 156 -  Diastolic BP 78 82 80 72 78 78 -  Wt. (Lbs) 193 191 191.12 188 188 186 190  BMI 31.15 30.83 30.85 30.36 30.36 30.04 30.68   Foot/eye exam completion dates Latest Ref Rng & Units 06/17/2017 04/02/2016  Eye Exam No Retinopathy - -  Foot Form Completion - Done Done        Obesity (BMI 30.0-34.9) Deteriorated. Patient re-educated about  the importance of commitment to a  minimum of 150 minutes of exercise per week.  The  importance of healthy food choices with portion control discussed. Encouraged to start a food diary, count calories and to consider  joining a support group. Sample diet sheets offered. Goals set by the patient for the next several months.   Weight /BMI 06/17/2017 12/18/2016 08/20/2016  WEIGHT 193 lb 191 lb 191 lb 1.9 oz  HEIGHT 5\' 6"  5\' 6"  5\' 6"   BMI 31.15 kg/m2 30.83 kg/m2 30.85 kg/m2      Vitamin D deficiency Needs OTC vit D3 , 2000 IU once daily

## 2017-06-22 NOTE — Assessment & Plan Note (Signed)
Under corrected, dose increase in synthroid

## 2017-06-22 NOTE — Assessment & Plan Note (Signed)
May discontinue metformin Deanna Bush is reminded of the importance of commitment to daily physical activity for 30 minutes or more, as able and the need to limit carbohydrate intake to 30 to 60 grams per meal to help with blood sugar control.   The need to take medication as prescribed, test blood sugar as directed, and to call between visits if there is a concern that blood sugar is uncontrolled is also discussed.   Deanna Bush is reminded of the importance of daily foot exam, annual eye examination, and good blood sugar, blood pressure and cholesterol control.  Diabetic Labs Latest Ref Rng & Units 06/17/2017 06/14/2017 11/29/2016 08/12/2016 04/03/2016  HbA1c <5.7 % of total Hgb - 6.1(H) 6.1(H) 6.0(H) -  Microalbumin mg/dL <0.2 - - - <3.0(H)  Micro/Creat Ratio <30 mcg/mg creat NOTE - - - <6.2  Chol <200 mg/dL - 168 - 159 -  HDL >50 mg/dL - 70 - 70 -  Calc LDL mg/dL - - - 82 -  Triglycerides <150 mg/dL - 50 - 37 -  Creatinine 0.50 - 1.05 mg/dL - 0.92 0.81 0.73 -   BP/Weight 06/17/2017 12/18/2016 08/20/2016 04/02/2016 11/29/2015 07/26/2015 03/07/5614  Systolic BP 379 432 761 470 929 574 -  Diastolic BP 78 82 80 72 78 78 -  Wt. (Lbs) 193 191 191.12 188 188 186 190  BMI 31.15 30.83 30.85 30.36 30.36 30.04 30.68   Foot/eye exam completion dates Latest Ref Rng & Units 06/17/2017 04/02/2016  Eye Exam No Retinopathy - -  Foot Form Completion - Done Done

## 2017-07-03 ENCOUNTER — Other Ambulatory Visit: Payer: Self-pay | Admitting: Family Medicine

## 2017-07-29 ENCOUNTER — Other Ambulatory Visit: Payer: Self-pay | Admitting: Family Medicine

## 2017-07-29 ENCOUNTER — Other Ambulatory Visit: Payer: Self-pay | Admitting: Orthopedic Surgery

## 2017-07-29 DIAGNOSIS — Z1231 Encounter for screening mammogram for malignant neoplasm of breast: Secondary | ICD-10-CM

## 2017-08-20 ENCOUNTER — Encounter (HOSPITAL_COMMUNITY): Payer: Self-pay

## 2017-08-20 ENCOUNTER — Ambulatory Visit (HOSPITAL_COMMUNITY)
Admission: RE | Admit: 2017-08-20 | Discharge: 2017-08-20 | Disposition: A | Discharge status: Home / Self Care | Payer: No Typology Code available for payment source | Source: Ambulatory Visit | Attending: Family Medicine | Admitting: Family Medicine

## 2017-08-20 DIAGNOSIS — Z1231 Encounter for screening mammogram for malignant neoplasm of breast: Secondary | ICD-10-CM | POA: Diagnosis present

## 2017-10-13 LAB — TSH: TSH: 1.22 mIU/L (ref 0.40–4.50)

## 2017-10-13 LAB — COMPLETE METABOLIC PANEL WITH GFR
AG RATIO: 1.4 (calc) (ref 1.0–2.5)
ALBUMIN MSPROF: 4.2 g/dL (ref 3.6–5.1)
ALT: 13 U/L (ref 6–29)
AST: 15 U/L (ref 10–35)
Alkaline phosphatase (APISO): 76 U/L (ref 33–130)
BUN: 17 mg/dL (ref 7–25)
CALCIUM: 9.7 mg/dL (ref 8.6–10.4)
CO2: 28 mmol/L (ref 20–32)
CREATININE: 0.81 mg/dL (ref 0.50–1.05)
Chloride: 105 mmol/L (ref 98–110)
GFR, EST AFRICAN AMERICAN: 93 mL/min/{1.73_m2} (ref >=60)
GFR, EST NON AFRICAN AMERICAN: 80 mL/min/{1.73_m2} (ref >=60)
GLOBULIN: 3.1 g/dL (ref 1.9–3.7)
Glucose, Bld: 98 mg/dL (ref 65–99)
POTASSIUM: 4 mmol/L (ref 3.5–5.3)
SODIUM: 139 mmol/L (ref 135–146)
TOTAL PROTEIN: 7.3 g/dL (ref 6.1–8.1)
Total Bilirubin: 0.5 mg/dL (ref 0.2–1.2)

## 2017-10-13 LAB — CBC
HEMATOCRIT: 36 % (ref 35.0–45.0)
HEMOGLOBIN: 11.8 g/dL (ref 11.7–15.5)
MCH: 29.6 pg (ref 27.0–33.0)
MCHC: 32.8 g/dL (ref 32.0–36.0)
MCV: 90.2 fL (ref 80.0–100.0)
MPV: 11 fL (ref 7.5–12.5)
Platelets: 282 10*3/uL (ref 140–400)
RBC: 3.99 10*6/uL (ref 3.80–5.10)
RDW: 11.9 % (ref 11.0–15.0)
WBC: 4.5 10*3/uL (ref 3.8–10.8)

## 2017-10-13 LAB — HEMOGLOBIN A1C
EAG (MMOL/L): 7.3 (calc)
Hgb A1c MFr Bld: 6.2 % of total Hgb — ABNORMAL HIGH (ref <5.7)
Mean Plasma Glucose: 131 (calc)

## 2017-10-14 ENCOUNTER — Encounter: Payer: Self-pay | Admitting: Family Medicine

## 2017-10-14 ENCOUNTER — Ambulatory Visit (INDEPENDENT_AMBULATORY_CARE_PROVIDER_SITE_OTHER): Payer: PRIVATE HEALTH INSURANCE | Admitting: Family Medicine

## 2017-10-14 ENCOUNTER — Other Ambulatory Visit (HOSPITAL_COMMUNITY)
Admission: RE | Admit: 2017-10-14 | Discharge: 2017-10-14 | Disposition: A | Discharge status: Home / Self Care | Payer: PRIVATE HEALTH INSURANCE | Source: Ambulatory Visit | Attending: Family Medicine | Admitting: Family Medicine

## 2017-10-14 VITALS — BP 120/70 | HR 70 | Resp 16 | Ht 66.0 in | Wt 192.0 lb

## 2017-10-14 DIAGNOSIS — Z124 Encounter for screening for malignant neoplasm of cervix: Secondary | ICD-10-CM | POA: Diagnosis not present

## 2017-10-14 DIAGNOSIS — Z0000 Encounter for general adult medical examination without abnormal findings: Secondary | ICD-10-CM

## 2017-10-14 DIAGNOSIS — Z1211 Encounter for screening for malignant neoplasm of colon: Secondary | ICD-10-CM | POA: Diagnosis not present

## 2017-10-14 DIAGNOSIS — C50912 Malignant neoplasm of unspecified site of left female breast: Secondary | ICD-10-CM | POA: Diagnosis not present

## 2017-10-14 DIAGNOSIS — E038 Other specified hypothyroidism: Secondary | ICD-10-CM

## 2017-10-14 DIAGNOSIS — J320 Chronic maxillary sinusitis: Secondary | ICD-10-CM | POA: Diagnosis not present

## 2017-10-14 DIAGNOSIS — E669 Obesity, unspecified: Secondary | ICD-10-CM

## 2017-10-14 DIAGNOSIS — E1169 Type 2 diabetes mellitus with other specified complication: Secondary | ICD-10-CM

## 2017-10-14 DIAGNOSIS — E559 Vitamin D deficiency, unspecified: Secondary | ICD-10-CM

## 2017-10-14 LAB — POC HEMOCCULT BLD/STL (OFFICE/1-CARD/DIAGNOSTIC): Fecal Occult Blood, POC: NEGATIVE

## 2017-10-14 MED ORDER — LEVOTHYROXINE SODIUM 100 MCG PO TABS: 100.0000 ug | ORAL_TABLET | Freq: Every day | ORAL | 3 refills | Status: DC

## 2017-10-14 MED ORDER — AZITHROMYCIN 250 MG PO TABS: ORAL_TABLET | ORAL | 0 refills | Status: DC

## 2017-10-14 NOTE — Patient Instructions (Addendum)
F/U in 5 months , call if you need me before  Medication is sent in for treatment of sinus infection  Please schedule eye  appt , past due  Pap today  Prescription for mastectomy supplies [facility]  TSH, HBA1C, fasting lipid, cmp and EGFr, and vit D f 1 week before folow up  Please work on good  health habits so that your health will improve. 1. Commitment to daily physical activity for 30 to 60  minutes, if you are able to do this.  2. Commitment to wise food choices. Aim for half of your  food intake to be vegetable and fruit, one quarter starchy foods, and one quarter protein. Try to eat on a regular schedule  3 meals per day, snacking between meals should be limited to vegetables or fruits or small portions of nuts. 64 ounces of water per day is generally recommended, unless you have specific health conditions, like heart failure or kidney failure where you will need to limit fluid intake.  3. Commitment to sufficient and a  good quality of physical and mental rest daily, generally between 6 to 8 hours per day.  WITH PERSISTANCE AND PERSEVERANCE, THE IMPOSSIBLE , BECOMES THE NORM! It is important that you exercise regularly at least 30 minutes 5 times a week. If you develop chest pain, have severe difficulty breathing, or feel very tired, stop exercising immediately and seek medical attention

## 2017-10-14 NOTE — Assessment & Plan Note (Signed)
Controlled, no change in medication [name] is reminded of the importance of commitment to daily physical activity for 30 minutes or more, as able and the need to limit carbohydrate intake to 30 to 60 grams per meal to help with blood sugar control.   No change in management   [name] is reminded of the importance of daily foot exam, annual eye examination, and good blood sugar, blood pressure and cholesterol control.  Diabetic Labs Latest Ref Rng & Units 10/11/2017 06/17/2017 06/14/2017 11/29/2016 08/12/2016  HbA1c <5.7 % of total Hgb 6.2(H) - 6.1(H) 6.1(H) 6.0(H)  Microalbumin mg/dL - <0.2 - - -  Micro/Creat Ratio <30 mcg/mg creat - NOTE - - -  Chol <200 mg/dL - - 168 - 159  HDL >50 mg/dL - - 70 - 70  Calc LDL mg/dL - - - - 82  Triglycerides <150 mg/dL - - 50 - 37  Creatinine 0.50 - 1.05 mg/dL 0.81 - 0.92 0.81 0.73   BP/Weight 10/14/2017 06/17/2017 12/18/2016 08/20/2016 04/02/2016 11/29/2015 15/72/6203  Systolic BP 559 741 638 453 646 803 212  Diastolic BP 70 78 82 80 72 78 78  Wt. (Lbs) 192 193 191 191.12 188 188 186  BMI 30.99 31.15 30.83 30.85 30.36 30.36 30.04   Foot/eye exam completion dates Latest Ref Rng & Units 06/17/2017 04/02/2016  Eye Exam No Retinopathy - -  Foot Form Completion - Done Done

## 2017-10-14 NOTE — Assessment & Plan Note (Signed)
Controlled, no change in medication  

## 2017-10-14 NOTE — Progress Notes (Addendum)
Deanna Bush     MRN: [id number]      DOB: [birth year]  HPI: Patient is in for annual physical exam.  2 week h/o left maxillary sinus pressure with green nasal drainage and dizziness  Recent labs,  are reviewed. Immunization is reviewed , and  Are up to date   PE:  BP 120/70   Pulse 70   Resp 16   Ht 5\' 6"  (1.676 m)   Wt 192 lb (87.1 kg)   SpO2 96%   BMI 30.99 kg/m   Pleasant  female, alert and oriented x 3, in no cardio-pulmonary distress. Afebrile. HEENT No facial trauma or asymetry.Left maxillary sinus tender Extra occullar muscles intact External ears normal, tympanic membranes clear. Oropharynx moist, no exudate. Neck: supple, no adenopathy,JVD or thyromegaly.No bruits.  Chest: Clear to ascultation bilaterally.No crackles or wheezes. Non tender to palpation  Breast: No asymetry,no masses or lumps. No tenderness. No nipple discharge or inversion. No axillary or supraclavicular adenopathy  Cardiovascular system; Heart sounds normal,  S1 and  S2 ,no S3.  No murmur, or thrill. Apical beat not displaced Peripheral pulses normal.  Abdomen: Soft, non tender, no organomegaly or masses. No bruits. Bowel sounds normal. No guarding, tenderness or rebound.  Rectal:  Normal sphincter tone. No rectal mass. Guaiac negative stool.  GU: External genitalia normal female genitalia , normal female distribution of hair. No lesions. Urethral meatus normal in size, no  Prolapse, no lesions visibly  Present. Bladder non tender. Vagina pink and moist , with no visible lesions , discharge present . Adequate pelvic support no  cystocele or rectocele noted Cervix pink and appears healthy, no lesions or ulcerations noted, no discharge noted from os Uterus normal size, no adnexal masses, no cervical motion or adnexal tenderness.   Musculoskeletal exam: Full ROM of spine, hips , shoulders and knees. No deformity ,swelling or crepitus noted. No muscle wasting or atrophy.    Neurologic: Cranial nerves 2 to 12 intact. Power, tone ,sensation and reflexes normal throughout. No disturbance in gait. No tremor.  Skin: Intact, no ulceration, erythema , scaling or rash noted. Pigmentation normal throughout  Psych; Normal mood and affect. Judgement and concentration normal   Assessment & Plan:  Annual physical exam Annual exam as documented. Counseling done  re healthy lifestyle involving commitment to 150 minutes exercise per week, heart healthy diet, and attaining healthy weight.The importance of adequate sleep also discussed. Regular seat belt use and home safety, is also discussed. Changes in health habits are decided on by the patient with goals and time frames  set for achieving them. Immunization and cancer screening needs are specifically addressed at this visit.   Maxillary sinusitis, chronic 2 week h/o left maxillary sinus pressure wit nasal drainage and vertigo, Z pack prescribed  Hypothyroidism Controlled, no change in medication   Diabetes mellitus type 2 in obese (HCC) Controlled, no change in medication Deanna Bush is reminded of the importance of commitment to daily physical activity for 30 minutes or more, as able and the need to limit carbohydrate intake to 30 to 60 grams per meal to help with blood sugar control.   No change in management   Deanna Bush is reminded of the importance of daily foot exam, annual eye examination, and good blood sugar, blood pressure and cholesterol control.  Diabetic Labs Latest Ref Rng & Units 10/11/2017 06/17/2017 06/14/2017 11/29/2016 08/12/2016  HbA1c <5.7 % of total Hgb 6.2(H) - 6.1(H) 6.1(H) 6.0(H)  Microalbumin mg/dL - <  0.2 - - -  Micro/Creat Ratio <30 mcg/mg creat - NOTE - - -  Chol <200 mg/dL - - 168 - 159  HDL >50 mg/dL - - 70 - 70  Calc LDL mg/dL - - - - 82  Triglycerides <150 mg/dL - - 50 - 37  Creatinine 0.50 - 1.05 mg/dL 0.81 - 0.92 0.81 0.73   BP/Weight 10/14/2017 06/17/2017 12/18/2016 08/20/2016  04/02/2016 11/29/2015 01/00/7121  Systolic BP 975 883 254 982 641 583 094  Diastolic BP 70 78 82 80 72 78 78  Wt. (Lbs) 192 193 191 191.12 188 188 186  BMI 30.99 31.15 30.83 30.85 30.36 30.36 30.04   Foot/eye exam completion dates Latest Ref Rng & Units 06/17/2017 04/02/2016  Eye Exam No Retinopathy - -  Foot Form Completion - Done Done        Vitamin D deficiency Updated lab needed at/ before next visit.

## 2017-10-14 NOTE — Assessment & Plan Note (Signed)
Updated lab needed at/ before next visit.   

## 2017-10-14 NOTE — Assessment & Plan Note (Signed)
2 week h/o left maxillary sinus pressure wit nasal drainage and vertigo, Z pack prescribed

## 2017-10-14 NOTE — Assessment & Plan Note (Signed)

## 2017-10-16 MED ORDER — UNABLE TO FIND: 0 refills | Status: DC

## 2017-10-16 MED ORDER — UNABLE TO FIND: 2 refills | Status: DC

## 2017-10-17 ENCOUNTER — Encounter: Payer: Self-pay | Admitting: Family Medicine

## 2017-10-17 LAB — CYTOLOGY - PAP
DIAGNOSIS: NEGATIVE
HPV (WINDOPATH): NOT DETECTED

## 2017-10-18 ENCOUNTER — Encounter: Payer: Self-pay | Admitting: Family Medicine

## 2017-10-20 ENCOUNTER — Telehealth: Payer: Self-pay

## 2017-10-20 NOTE — Telephone Encounter (Signed)
Can you please call in a rx for yeast infection? [facility] [place]

## 2017-10-21 ENCOUNTER — Other Ambulatory Visit: Payer: Self-pay | Admitting: Family Medicine

## 2017-10-21 MED ORDER — FLUCONAZOLE 150 MG PO TABS: 150.0000 mg | ORAL_TABLET | Freq: Once | ORAL | 0 refills | Status: AC

## 2017-10-21 NOTE — Telephone Encounter (Signed)
medication is sent in

## 2017-10-22 NOTE — Telephone Encounter (Signed)
Patient aware (msg left)

## 2018-03-16 ENCOUNTER — Telehealth: Payer: Self-pay

## 2018-03-16 ENCOUNTER — Encounter: Payer: Self-pay | Admitting: Family Medicine

## 2018-03-16 ENCOUNTER — Ambulatory Visit (INDEPENDENT_AMBULATORY_CARE_PROVIDER_SITE_OTHER): Payer: PRIVATE HEALTH INSURANCE | Admitting: Family Medicine

## 2018-03-16 VITALS — BP 120/78 | HR 66 | Resp 16 | Ht 66.0 in | Wt 193.0 lb

## 2018-03-16 DIAGNOSIS — E785 Hyperlipidemia, unspecified: Secondary | ICD-10-CM | POA: Diagnosis not present

## 2018-03-16 DIAGNOSIS — E038 Other specified hypothyroidism: Secondary | ICD-10-CM | POA: Diagnosis not present

## 2018-03-16 DIAGNOSIS — E1169 Type 2 diabetes mellitus with other specified complication: Secondary | ICD-10-CM

## 2018-03-16 DIAGNOSIS — E669 Obesity, unspecified: Secondary | ICD-10-CM

## 2018-03-16 DIAGNOSIS — E559 Vitamin D deficiency, unspecified: Secondary | ICD-10-CM

## 2018-03-16 NOTE — Patient Instructions (Signed)
F/u mid December, please call  For lab orderi n Occtober. Call if you need me before  We will call your cell [phone] with recent labs  Weight loss goal of 7 pounds   Please work on good  health habits so that your health will improve. 1. Commitment to daily physical activity for 30 to 60  minutes, if you are able to do this.  2. Commitment to wise food choices. Aim for half of your  food intake to be vegetable and fruit, one quarter starchy foods, and one quarter protein. Try to eat on a regular schedule  3 meals per day, snacking between meals should be limited to vegetables or fruits or small portions of nuts. 64 ounces of water per day is generally recommended, unless you have specific health conditions, like heart failure or kidney failure where you will need to limit fluid intake.  3. Commitment to sufficient and a  good quality of physical and mental rest daily, generally between 6 to 8 hours per day.  WITH PERSISTANCE AND PERSEVERANCE, THE IMPOSSIBLE , BECOMES THE NORM! Thank you  for choosing [facility]. We consider it a privelige to serve you.  Delivering excellent health care in a caring and  compassionate way is our goal.  Partnering with you,  so that together we can achieve this goal is our strategy.

## 2018-03-16 NOTE — Telephone Encounter (Signed)
Lab orders entered

## 2018-03-17 LAB — COMPLETE METABOLIC PANEL WITH GFR
AG Ratio: 1.5 (calc) (ref 1.0–2.5)
ALT: 19 U/L (ref 6–29)
AST: 19 U/L (ref 10–35)
Albumin: 4.3 g/dL (ref 3.6–5.1)
Alkaline phosphatase (APISO): 79 U/L (ref 33–130)
BUN: 13 mg/dL (ref 7–25)
CALCIUM: 9.6 mg/dL (ref 8.6–10.4)
CO2: 25 mmol/L (ref 20–32)
Chloride: 103 mmol/L (ref 98–110)
Creat: 0.65 mg/dL (ref 0.50–1.05)
GFR, EST AFRICAN AMERICAN: 113 mL/min/{1.73_m2} (ref >=60)
GFR, EST NON AFRICAN AMERICAN: 98 mL/min/{1.73_m2} (ref >=60)
Globulin: 2.9 g/dL (calc) (ref 1.9–3.7)
Glucose, Bld: 106 mg/dL — ABNORMAL HIGH (ref 65–99)
Potassium: 4.3 mmol/L (ref 3.5–5.3)
Sodium: 137 mmol/L (ref 135–146)
TOTAL PROTEIN: 7.2 g/dL (ref 6.1–8.1)
Total Bilirubin: 0.4 mg/dL (ref 0.2–1.2)

## 2018-03-17 LAB — LIPID PANEL
CHOLESTEROL: 180 mg/dL (ref <200)
HDL: 63 mg/dL (ref >50)
LDL Cholesterol (Calc): 103 mg/dL (calc) — ABNORMAL HIGH
Non-HDL Cholesterol (Calc): 117 mg/dL (calc) (ref <130)
Total CHOL/HDL Ratio: 2.9 (calc) (ref <5.0)
Triglycerides: 59 mg/dL (ref <150)

## 2018-03-17 LAB — VITAMIN D 25 HYDROXY (VIT D DEFICIENCY, FRACTURES): Vit D, 25-Hydroxy: 37 ng/mL (ref 30–100)

## 2018-03-17 LAB — HEMOGLOBIN A1C
Hgb A1c MFr Bld: 6.3 % of total Hgb — ABNORMAL HIGH (ref <5.7)
Mean Plasma Glucose: 134 (calc)
eAG (mmol/L): 7.4 (calc)

## 2018-03-17 LAB — TSH: TSH: 0.1 mIU/L — ABNORMAL LOW (ref 0.40–4.50)

## 2018-03-17 NOTE — Assessment & Plan Note (Signed)
Not at goal, no med change, needs to reduce fat in diet Hyperlipidemia:Low fat diet discussed and encouraged.   Lipid Panel  Lab Results  Component Value Date   CHOL 180 03/16/2018   HDL 63 03/16/2018   LDLCALC 103 (H) 03/16/2018   TRIG 59 03/16/2018   CHOLHDL 2.9 03/16/2018

## 2018-03-17 NOTE — Progress Notes (Signed)
Deanna Bush     MRN: [id number]      DOB: 03/15/1961   HPI Deanna Bush is here for follow up and re-evaluation of chronic medical conditions, medication management and review of any available recent lab and radiology data.  Preventive health is updated, specifically  Cancer screening and Immunization.   Questions or concerns regarding consultations or procedures which the PT has had in the interim are  addressed. The PT denies any adverse reactions to current medications since the last visit.  There are no new concerns. She does feel the need to be more diligent in both exercise and dietary change so that she is able to lose weight  There are no specific complaints   ROS Denies recent fever or chills. Denies sinus pressure, nasal congestion, ear pain or sore throat. Denies chest congestion, productive cough or wheezing. Denies chest pains, palpitations and leg swelling Denies abdominal pain, nausea, vomiting,diarrhea or constipation.   Denies dysuria, frequency, hesitancy or incontinence. Denies joint pain, swelling and limitation in mobility. Denies headaches, seizures, numbness, or tingling. Denies depression, anxiety or insomnia. Denies skin break down or rash.   PE  BP 120/78   Pulse 66   Resp 16   Ht 5\' 6"  (1.676 m)   Wt 193 lb (87.5 kg)   SpO2 98%   BMI 31.15 kg/m   Patient alert and oriented and in no cardiopulmonary distress.  HEENT: No facial asymmetry, EOMI,   oropharynx pink and moist.  Neck supple no JVD, no mass.  Chest: Clear to auscultation bilaterally.  CVS: S1, S2 no murmurs, no S3.Regular rate.  ABD: Soft non tender.   Ext: No edema  MS: Adequate ROM spine, shoulders, hips and knees.  Skin: Intact, no ulcerations or rash noted.  Psych: Good eye contact, normal affect. Memory intact not anxious or depressed appearing.  CNS: CN 2-12 intact, power,  normal throughout.no focal deficits noted.   Assessment & Plan  Hypothyroidism Over corrected  , needs to reduce current med dose and rept TSH in 2 months  Diabetes mellitus type 2 in obese (HCC) Deteriorated , but still does not need mediccation Deanna Bush is reminded of the importance of commitment to daily physical activity for 30 minutes or more, as able and the need to limit carbohydrate intake to 30 to 60 grams per meal to help with blood sugar control.    Deanna Bush is reminded of the importance of daily foot exam, annual eye examination, and good blood sugar, blood pressure and cholesterol control.  Diabetic Labs Latest Ref Rng & Units 03/16/2018 10/11/2017 06/17/2017 06/14/2017 11/29/2016  HbA1c <5.7 % of total Hgb 6.3(H) 6.2(H) - 6.1(H) 6.1(H)  Microalbumin mg/dL - - <0.2 - -  Micro/Creat Ratio <30 mcg/mg creat - - NOTE - -  Chol <200 mg/dL 180 - - 168 -  HDL >50 mg/dL 63 - - 70 -  Calc LDL mg/dL (calc) 103(H) - - 86 -  Triglycerides <150 mg/dL 59 - - 50 -  Creatinine 0.50 - 1.05 mg/dL 0.65 0.81 - 0.92 0.81   BP/Weight 03/16/2018 10/14/2017 06/17/2017 12/18/2016 08/20/2016 04/02/2016 01/02/9241  Systolic BP 683 419 622 297 989 211 941  Diastolic BP 78 70 78 82 80 72 78  Wt. (Lbs) 193 192 193 191 191.12 188 188  BMI 31.15 30.99 31.15 30.83 30.85 30.36 30.36   Foot/eye exam completion dates Latest Ref Rng & Units 06/17/2017 04/02/2016  Eye Exam No Retinopathy - -  Foot Form  Completion - Done Done        Vitamin D deficiency Adequately corrected  Hyperlipidemia with target LDL less than 100 Not at goal, no med change, needs to reduce fat in diet Hyperlipidemia:Low fat diet discussed and encouraged.   Lipid Panel  Lab Results  Component Value Date   CHOL 180 03/16/2018   HDL 63 03/16/2018   LDLCALC 103 (H) 03/16/2018   TRIG 59 03/16/2018   CHOLHDL 2.9 03/16/2018       Obesity (BMI 30.0-34.9) Deteriorated. Patient re-educated about  the importance of commitment to a  minimum of 150 minutes of exercise per week.  The importance of healthy food choices with portion  control discussed. Encouraged to start a food diary, count calories and to consider  joining a support group. Sample diet sheets offered. Goals set by the patient for the next several months.   Weight /BMI 03/16/2018 10/14/2017 06/17/2017  WEIGHT 193 lb 192 lb 193 lb  HEIGHT 5\' 6"  5\' 6"  5\' 6"   BMI 31.15 kg/m2 30.99 kg/m2 31.15 kg/m2

## 2018-03-17 NOTE — Assessment & Plan Note (Signed)
Adequately corrected  

## 2018-03-17 NOTE — Assessment & Plan Note (Signed)
Deteriorated. Patient re-educated about  the importance of commitment to a  minimum of 150 minutes of exercise per week.  The importance of healthy food choices with portion control discussed. Encouraged to start a food diary, count calories and to consider  joining a support group. Sample diet sheets offered. Goals set by the patient for the next several months.   Weight /BMI 03/16/2018 10/14/2017 06/17/2017  WEIGHT 193 lb 192 lb 193 lb  HEIGHT 5\' 6"  5\' 6"  5\' 6"   BMI 31.15 kg/m2 30.99 kg/m2 31.15 kg/m2

## 2018-03-17 NOTE — Assessment & Plan Note (Signed)
Deteriorated , but still does not need mediccation Deanna Bush is reminded of the importance of commitment to daily physical activity for 30 minutes or more, as able and the need to limit carbohydrate intake to 30 to 60 grams per meal to help with blood sugar control.    Deanna Bush is reminded of the importance of daily foot exam, annual eye examination, and good blood sugar, blood pressure and cholesterol control.  Diabetic Labs Latest Ref Rng & Units 03/16/2018 10/11/2017 06/17/2017 06/14/2017 11/29/2016  HbA1c <5.7 % of total Hgb 6.3(H) 6.2(H) - 6.1(H) 6.1(H)  Microalbumin mg/dL - - <0.2 - -  Micro/Creat Ratio <30 mcg/mg creat - - NOTE - -  Chol <200 mg/dL 180 - - 168 -  HDL >50 mg/dL 63 - - 70 -  Calc LDL mg/dL (calc) 103(H) - - 86 -  Triglycerides <150 mg/dL 59 - - 50 -  Creatinine 0.50 - 1.05 mg/dL 0.65 0.81 - 0.92 0.81   BP/Weight 03/16/2018 10/14/2017 06/17/2017 12/18/2016 08/20/2016 04/02/2016 8/46/6599  Systolic BP 357 017 793 903 009 233 007  Diastolic BP 78 70 78 82 80 72 78  Wt. (Lbs) 193 192 193 191 191.12 188 188  BMI 31.15 30.99 31.15 30.83 30.85 30.36 30.36   Foot/eye exam completion dates Latest Ref Rng & Units 06/17/2017 04/02/2016  Eye Exam No Retinopathy - -  Foot Form Completion - Done Done

## 2018-03-17 NOTE — Assessment & Plan Note (Signed)
Over corrected , needs to reduce current med dose and rept TSH in 2 months

## 2018-04-09 ENCOUNTER — Other Ambulatory Visit: Payer: Self-pay | Admitting: Family Medicine

## 2018-05-05 ENCOUNTER — Ambulatory Visit: Payer: PRIVATE HEALTH INSURANCE | Admitting: Family Medicine

## 2018-05-05 ENCOUNTER — Other Ambulatory Visit: Payer: Self-pay

## 2018-05-05 ENCOUNTER — Encounter: Payer: Self-pay | Admitting: Family Medicine

## 2018-05-05 VITALS — BP 126/62 | HR 65 | Resp 12 | Ht 66.0 in | Wt 191.1 lb

## 2018-05-05 DIAGNOSIS — E785 Hyperlipidemia, unspecified: Secondary | ICD-10-CM | POA: Diagnosis not present

## 2018-05-05 DIAGNOSIS — N3001 Acute cystitis with hematuria: Secondary | ICD-10-CM | POA: Diagnosis not present

## 2018-05-05 DIAGNOSIS — E669 Obesity, unspecified: Secondary | ICD-10-CM

## 2018-05-05 DIAGNOSIS — E1169 Type 2 diabetes mellitus with other specified complication: Secondary | ICD-10-CM

## 2018-05-05 DIAGNOSIS — Z23 Encounter for immunization: Secondary | ICD-10-CM | POA: Diagnosis not present

## 2018-05-05 DIAGNOSIS — E559 Vitamin D deficiency, unspecified: Secondary | ICD-10-CM

## 2018-05-05 DIAGNOSIS — E038 Other specified hypothyroidism: Secondary | ICD-10-CM | POA: Diagnosis not present

## 2018-05-05 LAB — POCT URINALYSIS DIPSTICK
Bilirubin, UA: NEGATIVE
GLUCOSE UA: NEGATIVE
Ketones, UA: NEGATIVE
Nitrite, UA: POSITIVE
Protein, UA: NEGATIVE
Spec Grav, UA: 1.02 (ref 1.010–1.025)
Urobilinogen, UA: 0.2 E.U./dL
pH, UA: 5.5 (ref 5.0–8.0)

## 2018-05-05 MED ORDER — CIPROFLOXACIN HCL 500 MG PO TABS: 500.0000 mg | ORAL_TABLET | Freq: Two times a day (BID) | ORAL | 0 refills | Status: DC

## 2018-05-05 MED ORDER — FLUCONAZOLE 150 MG PO TABS: 150.0000 mg | ORAL_TABLET | Freq: Once | ORAL | 0 refills | Status: AC

## 2018-05-05 NOTE — Patient Instructions (Addendum)
F/u as before , call if you need me sooner  Shingrix #1 today    You are treated today for urinary tract infection, medication is sent to your pharmacy. I will send final report on My chart, please respond   Lab today TSH and vit D  Lab order for December visit today , fasting lipid, cmp and EGFR, HBA1C, TSH and microalb  It is important that you exercise regularly at least 30 minutes 5 times a week. If you develop chest pain, have severe difficulty breathing, or feel very tired, stop exercising immediately and seek medical attention

## 2018-05-05 NOTE — Assessment & Plan Note (Signed)
Hyperlipidemia:Low fat diet discussed and encouraged.   Lipid Panel  Lab Results  Component Value Date   CHOL 180 03/16/2018   HDL 63 03/16/2018   LDLCALC 103 (H) 03/16/2018   TRIG 59 03/16/2018   CHOLHDL 2.9 03/16/2018   Updated lab needed at/ before next visit.

## 2018-05-05 NOTE — Assessment & Plan Note (Addendum)
2 day h/o acute symptoms, no fever or chills, abnormal UA, specimen sen fo c/s , cipro x 5 days prescribed also fluconazole

## 2018-05-05 NOTE — Assessment & Plan Note (Addendum)
rept lab today and in 4 months, rept shows still overcorrected , propose dose reduction to 75 mcg daily

## 2018-05-06 ENCOUNTER — Encounter: Payer: Self-pay | Admitting: Family Medicine

## 2018-05-06 DIAGNOSIS — Z23 Encounter for immunization: Secondary | ICD-10-CM | POA: Insufficient documentation

## 2018-05-06 LAB — TSH: TSH: 0.34 m[IU]/L — AB (ref 0.40–4.50)

## 2018-05-06 LAB — VITAMIN D 25 HYDROXY (VIT D DEFICIENCY, FRACTURES): VIT D 25 HYDROXY: 30 ng/mL (ref 30–100)

## 2018-05-06 MED ORDER — LEVOTHYROXINE SODIUM 75 MCG PO TABS: 75.0000 ug | ORAL_TABLET | Freq: Every day | ORAL | 1 refills | Status: DC

## 2018-05-06 NOTE — Assessment & Plan Note (Signed)
Adequately corrected  

## 2018-05-06 NOTE — Assessment & Plan Note (Signed)
Unchanged. Patient re-educated about  the importance of commitment to a  minimum of 150 minutes of exercise per week.  The importance of healthy food choices with portion control discussed. Encouraged to start a food diary, count calories and to consider  joining a support group. Sample diet sheets offered. Goals set by the patient for the next several months.   Weight /BMI 05/05/2018 03/16/2018 10/14/2017  WEIGHT 191 lb 1.3 oz 193 lb 192 lb  HEIGHT 5\' 6"  5\' 6"  5\' 6"   BMI 30.84 kg/m2 31.15 kg/m2 30.99 kg/m2

## 2018-05-06 NOTE — Assessment & Plan Note (Signed)
Shingrix #1 administered by CMA  After informed consent, no adverse effects noted

## 2018-05-06 NOTE — Progress Notes (Signed)
   Deanna Bush     MRN: [id number]      DOB: 1961-09-14   HPI Deanna Bush is here with a 2 day h/o dysuria and frequency , she denies fever or flank pain, nausea or vomiting Waking regularly and  Exercising but still over eats  ROS Denies recent fever or chills. Denies sinus pressure, nasal congestion, ear pain or sore throat. Denies chest congestion, productive cough or wheezing. Denies chest pains, palpitations and leg swelling Denies abdominal pain, nausea, vomiting,diarrhea or constipation.   . Denies joint pain, swelling and limitation in mobility. Denies headaches, seizures, numbness, or tingling. Denies depression, anxiety or insomnia. Denies skin break down or rash.   PE  BP 126/62 (BP Location: Right Arm, Patient Position: Sitting, Cuff Size: Large)   Pulse 65   Resp 12   Ht 5\' 6"  (1.676 m)   Wt 191 lb 1.3 oz (86.7 kg)   SpO2 98%   BMI 30.84 kg/m   Patient alert and oriented and in no cardiopulmonary distress.  HEENT: No facial asymmetry, EOMI,   oropharynx pink and moist.  Neck supple no JVD, no mass.  Chest: Clear to auscultation bilaterally.  CVS: S1, S2 no murmurs, no S3.Regular rate.  ABD: Soft non tender. No renal angle tenderness, mild suprapubic tederness Ext: No edema  MS: Adequate ROM spine, shoulders, hips and knees.  Skin: Intact, no ulcerations or rash noted.  Psych: Good eye contact, normal affect. Memory intact not anxious or depressed appearing.  CNS: CN 2-12 intact, power,  normal throughout.no focal deficits noted.   Assessment & Plan  Acute cystitis with hematuria 2 day h/o acute symptoms, no fever or chills, abnormal UA, specimen sen fo c/s , cipro x 5 days prescribed also fluconazole  Hypothyroidism rept lab today and in 4 months, rept shows still overcorrected , propose dose reduction to 75 mcg daily  Hyperlipidemia with target LDL less than 100 Hyperlipidemia:Low fat diet discussed and encouraged.   Lipid Panel  Lab Results   Component Value Date   CHOL 180 03/16/2018   HDL 63 03/16/2018   LDLCALC 103 (H) 03/16/2018   TRIG 59 03/16/2018   CHOLHDL 2.9 03/16/2018   Updated lab needed at/ before next visit.     Obesity (BMI 30.0-34.9) Unchanged. Patient re-educated about  the importance of commitment to a  minimum of 150 minutes of exercise per week.  The importance of healthy food choices with portion control discussed. Encouraged to start a food diary, count calories and to consider  joining a support group. Sample diet sheets offered. Goals set by the patient for the next several months.   Weight /BMI 05/05/2018 03/16/2018 10/14/2017  WEIGHT 191 lb 1.3 oz 193 lb 192 lb  HEIGHT 5\' 6"  5\' 6"  5\' 6"   BMI 30.84 kg/m2 31.15 kg/m2 30.99 kg/m2      Vitamin D deficiency Adequately corrected

## 2018-06-01 ENCOUNTER — Telehealth: Payer: Self-pay | Admitting: Family Medicine

## 2018-06-01 NOTE — Telephone Encounter (Signed)
Pt stopped by she needs Levothyroxine called into [facility] on [address]..., as the original order was sent to [facility] for 100 MG--and Dr Moshe Cipro changed it to 37 MG.. Pt is down to 3 pills, and I was thinking we could do a override to save out of pocket cost to the member, however Pt states that she will pay OOP if she needs to .Marland Kitchen...[phone]

## 2018-06-03 ENCOUNTER — Other Ambulatory Visit: Payer: Self-pay

## 2018-06-03 MED ORDER — LEVOTHYROXINE SODIUM 75 MCG PO TABS: 75.0000 ug | ORAL_TABLET | Freq: Every day | ORAL | 1 refills | Status: DC

## 2018-06-03 NOTE — Telephone Encounter (Signed)
Med was refaxed to [facility]

## 2018-07-02 ENCOUNTER — Ambulatory Visit (INDEPENDENT_AMBULATORY_CARE_PROVIDER_SITE_OTHER): Payer: PRIVATE HEALTH INSURANCE

## 2018-07-02 DIAGNOSIS — Z23 Encounter for immunization: Secondary | ICD-10-CM

## 2018-07-02 NOTE — Progress Notes (Signed)
Patient came in today for 2nd Shingrix vaccine. Patient states no issues or reactions with first in series. Advised patient on what to watch for with verbal understanding. Administered in LEFT deltoid

## 2018-07-22 ENCOUNTER — Ambulatory Visit (INDEPENDENT_AMBULATORY_CARE_PROVIDER_SITE_OTHER): Payer: No Typology Code available for payment source

## 2018-07-22 DIAGNOSIS — Z23 Encounter for immunization: Secondary | ICD-10-CM | POA: Diagnosis not present

## 2018-08-18 ENCOUNTER — Other Ambulatory Visit: Payer: Self-pay | Admitting: Family Medicine

## 2018-08-18 DIAGNOSIS — Z1231 Encounter for screening mammogram for malignant neoplasm of breast: Secondary | ICD-10-CM

## 2018-08-28 ENCOUNTER — Ambulatory Visit (HOSPITAL_COMMUNITY)
Admission: RE | Admit: 2018-08-28 | Discharge: 2018-08-28 | Disposition: A | Discharge status: Home / Self Care | Payer: No Typology Code available for payment source | Source: Ambulatory Visit | Attending: Family Medicine | Admitting: Family Medicine

## 2018-08-28 DIAGNOSIS — Z1231 Encounter for screening mammogram for malignant neoplasm of breast: Secondary | ICD-10-CM | POA: Diagnosis present

## 2018-09-21 ENCOUNTER — Ambulatory Visit: Payer: PRIVATE HEALTH INSURANCE | Admitting: Family Medicine

## 2018-09-24 ENCOUNTER — Other Ambulatory Visit: Payer: Self-pay

## 2018-09-24 MED ORDER — ATORVASTATIN CALCIUM 10 MG PO TABS: ORAL_TABLET | ORAL | 1 refills | Status: DC

## 2018-10-02 ENCOUNTER — Telehealth: Payer: Self-pay | Admitting: Family Medicine

## 2018-10-02 ENCOUNTER — Other Ambulatory Visit: Payer: Self-pay

## 2018-10-02 MED ORDER — ATORVASTATIN CALCIUM 10 MG PO TABS: ORAL_TABLET | ORAL | 1 refills | Status: DC

## 2018-10-02 NOTE — Telephone Encounter (Signed)
Pt needs her atorvaston 10mg  called in to [facility] on [address]

## 2018-10-02 NOTE — Telephone Encounter (Signed)
Atorvastatin refill sent into [facility] on [address]

## 2018-10-24 LAB — COMPLETE METABOLIC PANEL WITH GFR
AG RATIO: 1.5 (calc) (ref 1.0–2.5)
ALT: 19 U/L (ref 6–29)
AST: 19 U/L (ref 10–35)
Albumin: 4.5 g/dL (ref 3.6–5.1)
Alkaline phosphatase (APISO): 79 U/L (ref 33–130)
BUN: 17 mg/dL (ref 7–25)
CALCIUM: 9.8 mg/dL (ref 8.6–10.4)
CO2: 27 mmol/L (ref 20–32)
CREATININE: 0.76 mg/dL (ref 0.50–1.05)
Chloride: 103 mmol/L (ref 98–110)
GFR, EST AFRICAN AMERICAN: 100 mL/min/{1.73_m2} (ref >=60)
GFR, EST NON AFRICAN AMERICAN: 86 mL/min/{1.73_m2} (ref >=60)
Globulin: 3 g/dL (calc) (ref 1.9–3.7)
Glucose, Bld: 95 mg/dL (ref 65–99)
POTASSIUM: 4.2 mmol/L (ref 3.5–5.3)
Sodium: 138 mmol/L (ref 135–146)
TOTAL PROTEIN: 7.5 g/dL (ref 6.1–8.1)
Total Bilirubin: 0.6 mg/dL (ref 0.2–1.2)

## 2018-10-24 LAB — MICROALBUMIN, URINE: MICROALB UR: 0.2 mg/dL

## 2018-10-24 LAB — LIPID PANEL
CHOLESTEROL: 185 mg/dL (ref <200)
HDL: 69 mg/dL (ref >50)
LDL Cholesterol (Calc): 100 mg/dL (calc) — ABNORMAL HIGH
Non-HDL Cholesterol (Calc): 116 mg/dL (calc) (ref <130)
TRIGLYCERIDES: 74 mg/dL (ref <150)
Total CHOL/HDL Ratio: 2.7 (calc) (ref <5.0)

## 2018-10-24 LAB — HEMOGLOBIN A1C
Hgb A1c MFr Bld: 6.4 % of total Hgb — ABNORMAL HIGH (ref <5.7)
MEAN PLASMA GLUCOSE: 137 (calc)
eAG (mmol/L): 7.6 (calc)

## 2018-10-24 LAB — TSH: TSH: 3.27 mIU/L (ref 0.40–4.50)

## 2018-10-26 ENCOUNTER — Other Ambulatory Visit: Payer: Self-pay

## 2018-10-26 MED ORDER — ATORVASTATIN CALCIUM 10 MG PO TABS: ORAL_TABLET | ORAL | 1 refills | Status: DC

## 2018-10-29 ENCOUNTER — Encounter: Payer: Self-pay | Admitting: Family Medicine

## 2018-10-29 ENCOUNTER — Ambulatory Visit: Payer: No Typology Code available for payment source | Admitting: Family Medicine

## 2018-10-29 VITALS — BP 126/60 | HR 57 | Resp 14 | Ht 66.0 in | Wt 199.0 lb

## 2018-10-29 DIAGNOSIS — H608X1 Other otitis externa, right ear: Secondary | ICD-10-CM

## 2018-10-29 DIAGNOSIS — E785 Hyperlipidemia, unspecified: Secondary | ICD-10-CM | POA: Diagnosis not present

## 2018-10-29 DIAGNOSIS — E669 Obesity, unspecified: Secondary | ICD-10-CM | POA: Diagnosis not present

## 2018-10-29 DIAGNOSIS — E038 Other specified hypothyroidism: Secondary | ICD-10-CM

## 2018-10-29 DIAGNOSIS — R7303 Prediabetes: Secondary | ICD-10-CM

## 2018-10-29 DIAGNOSIS — H6091 Unspecified otitis externa, right ear: Secondary | ICD-10-CM | POA: Insufficient documentation

## 2018-10-29 DIAGNOSIS — C50912 Malignant neoplasm of unspecified site of left female breast: Secondary | ICD-10-CM

## 2018-10-29 NOTE — Patient Instructions (Signed)
Physical exam in 6  Months, call if you need me before  Please make changes in eating as we discussed  It is important that you exercise regularly at least 30 minutes 5 times a week. If you develop chest pain, have severe difficulty breathing, or feel very tired, stop exercising immediately and seek medical attention    CBC, cmp and EGFR, HBA1C and TSH in 5.5 months  Foot exam today is good  Ear drop for right ear will be sent in , please do not put Q tips and hairpins in your ear  We will refill epipen as requested

## 2018-10-29 NOTE — Progress Notes (Signed)
Deanna Bush     MRN: [id number]      DOB: [birth year]   HPI Deanna Bush is here for follow up and re-evaluation of chronic medical conditions, medication management and review of any available recent lab and radiology data.  Preventive health is updated, specifically  Cancer screening and Immunization.   Questions or concerns regarding consultations or procedures which the PT has had in the interim are  addressed. The PT denies any adverse reactions to current medications since the last visit.  Right ear pain and fullness x 3 weeks, uses Qtips and hair pins to scratch her ear, and states she ruptured ear in the past Denies polyuria, polydipsia, blurred vision , has increased starch and sweet intake and reduced exercise over the holidays, plans to change this   ROS Denies recent fever or chills. Denies sinus pressure, nasal congestion,  or sore throat. Denies chest congestion, productive cough or wheezing. Denies chest pains, palpitations and leg swelling Denies abdominal pain, nausea, vomiting,diarrhea or constipation.   Denies dysuria, frequency, hesitancy or incontinence. Denies joint pain, swelling and limitation in mobility. Denies headaches, seizures, numbness, or tingling. Denies depression, anxiety or insomnia. Denies skin break down or rash.   PE  BP 126/60   Pulse (!) 57   Resp 14   Ht 5\' 6"  (1.676 m)   Wt 199 lb (90.3 kg)   SpO2 96% Comment: room air  BMI 32.12 kg/m   Patient alert and oriented and in no cardiopulmonary distress.  HEENT: No facial asymmetry, EOMI,   oropharynx pink and moist.  Neck supple no JVD, no mass.Right external ear erythematous, both TM clear with good lightr eflex  Chest: Clear to auscultation bilaterally.  CVS: S1, S2 no murmurs, no S3.Regular rate.  ABD: Soft non tender.   Ext: No edema  MS: Adequate ROM spine, shoulders, hips and knees.  Skin: Intact, no ulcerations or rash noted.  Psych: Good eye contact, normal affect. Memory  intact not anxious or depressed appearing.  CNS: CN 2-12 intact, power,  normal throughout.no focal deficits noted.   Assessment & Plan  Otitis externa of right ear Topical steroid prescribed and pt educated to d/c trauma  To ear  Prediabetes Deanna Bush is reminded of the importance of commitment to daily physical activity for 30 minutes or more, as able and the need to limit carbohydrate intake to 30 to 60 grams per meal to help with blood sugar control.   The need to take medication as prescribed, test blood sugar as directed, and to call between visits if there is a concern that blood sugar is uncontrolled is also discussed.   Deanna Bush is reminded of the importance of daily foot exam, annual eye examination, and good blood sugar, blood pressure and cholesterol control.  Diabetic Labs Latest Ref Rng & Units 10/23/2018 03/16/2018 10/11/2017 06/17/2017 06/14/2017  HbA1c <5.7 % of total Hgb 6.4(H) 6.3(H) 6.2(H) - 6.1(H)  Microalbumin mg/dL 0.2 - - <0.2 -  Micro/Creat Ratio <30 mcg/mg creat - - - NOTE -  Chol <200 mg/dL 185 180 - - 168  HDL >50 mg/dL 69 63 - - 70  Calc LDL mg/dL (calc) 100(H) 103(H) - - 86  Triglycerides <150 mg/dL 74 59 - - 50  Creatinine 0.50 - 1.05 mg/dL 0.76 0.65 0.81 - 0.92   BP/Weight 10/29/2018 05/05/2018 03/16/2018 10/14/2017 06/17/2017 12/18/2016 78/29/5621  Systolic BP 308 657 846 962 952 841 324  Diastolic BP 60 62 78 70 78 82  80  Wt. (Lbs) 199 191.08 193 192 193 191 191.12  BMI 32.12 30.84 31.15 30.99 31.15 30.83 30.85   Foot/eye exam completion dates Latest Ref Rng & Units 06/17/2017 04/02/2016  Eye Exam No Retinopathy - -  Foot Form Completion - Done Done      .  Hypothyroidism Controlled, no change in medication   Obesity (BMI 30.0-34.9) Deteriorated. Patient re-educated about  the importance of commitment to a  minimum of 150 minutes of exercise per week.  The importance of healthy food choices with portion control discussed. Encouraged to start a food  diary, count calories and to consider  joining a support group. Sample diet sheets offered. Goals set by the patient for the next several months.   Weight /BMI 10/29/2018 05/05/2018 03/16/2018  WEIGHT 199 lb 191 lb 1.3 oz 193 lb  HEIGHT 5\' 6"  5\' 6"  5\' 6"   BMI 32.12 kg/m2 30.84 kg/m2 31.15 kg/m2      Hyperlipidemia with target LDL less than 100 Hyperlipidemia:Low fat diet discussed and encouraged.   Lipid Panel  Lab Results  Component Value Date   CHOL 185 10/23/2018   HDL 69 10/23/2018   LDLCALC 100 (H) 10/23/2018   TRIG 74 10/23/2018   CHOLHDL 2.7 10/23/2018  needs to reduce fat intke

## 2018-10-30 ENCOUNTER — Encounter: Payer: Self-pay | Admitting: Family Medicine

## 2018-10-30 MED ORDER — MOMETASONE FUROATE 0.1 % EX SOLN: CUTANEOUS | 0 refills | Status: DC

## 2018-10-30 NOTE — Assessment & Plan Note (Signed)
Controlled, no change in medication  

## 2018-10-30 NOTE — Assessment & Plan Note (Signed)
Hyperlipidemia:Low fat diet discussed and encouraged.   Lipid Panel  Lab Results  Component Value Date   CHOL 185 10/23/2018   HDL 69 10/23/2018   LDLCALC 100 (H) 10/23/2018   TRIG 74 10/23/2018   CHOLHDL 2.7 10/23/2018  needs to reduce fat intke

## 2018-10-30 NOTE — Assessment & Plan Note (Signed)
Deteriorated. Patient re-educated about  the importance of commitment to a  minimum of 150 minutes of exercise per week.  The importance of healthy food choices with portion control discussed. Encouraged to start a food diary, count calories and to consider  joining a support group. Sample diet sheets offered. Goals set by the patient for the next several months.   Weight /BMI 10/29/2018 05/05/2018 03/16/2018  WEIGHT 199 lb 191 lb 1.3 oz 193 lb  HEIGHT 5\' 6"  5\' 6"  5\' 6"   BMI 32.12 kg/m2 30.84 kg/m2 31.15 kg/m2

## 2018-10-30 NOTE — Assessment & Plan Note (Signed)
Topical steroid prescribed and pt educated to d/c trauma  To ear

## 2018-10-30 NOTE — Assessment & Plan Note (Addendum)
Deanna Bush is reminded of the importance of commitment to daily physical activity for 30 minutes or more, as able and the need to limit carbohydrate intake to 30 to 60 grams per meal to help with blood sugar control.   The need to take medication as prescribed, test blood sugar as directed, and to call between visits if there is a concern that blood sugar is uncontrolled is also discussed.   Deanna Bush is reminded of the importance of daily foot exam, annual eye examination, and good blood sugar, blood pressure and cholesterol control.  Diabetic Labs Latest Ref Rng & Units 10/23/2018 03/16/2018 10/11/2017 06/17/2017 06/14/2017  HbA1c <5.7 % of total Hgb 6.4(H) 6.3(H) 6.2(H) - 6.1(H)  Microalbumin mg/dL 0.2 - - <0.2 -  Micro/Creat Ratio <30 mcg/mg creat - - - NOTE -  Chol <200 mg/dL 185 180 - - 168  HDL >50 mg/dL 69 63 - - 70  Calc LDL mg/dL (calc) 100(H) 103(H) - - 86  Triglycerides <150 mg/dL 74 59 - - 50  Creatinine 0.50 - 1.05 mg/dL 0.76 0.65 0.81 - 0.92   BP/Weight 10/29/2018 05/05/2018 03/16/2018 10/14/2017 06/17/2017 12/18/2016 76/72/0947  Systolic BP 096 283 662 947 654 650 354  Diastolic BP 60 62 78 70 78 82 80  Wt. (Lbs) 199 191.08 193 192 193 191 191.12  BMI 32.12 30.84 31.15 30.99 31.15 30.83 30.85   Foot/eye exam completion dates Latest Ref Rng & Units 06/17/2017 04/02/2016  Eye Exam No Retinopathy - -  Foot Form Completion - Done Done      .

## 2018-11-06 ENCOUNTER — Other Ambulatory Visit (HOSPITAL_COMMUNITY)
Admission: RE | Admit: 2018-11-06 | Discharge: 2018-11-06 | Disposition: A | Discharge status: Home / Self Care | Payer: No Typology Code available for payment source | Source: Ambulatory Visit | Attending: Family Medicine | Admitting: Family Medicine

## 2018-11-06 ENCOUNTER — Ambulatory Visit: Payer: No Typology Code available for payment source | Admitting: Family Medicine

## 2018-11-06 ENCOUNTER — Encounter: Payer: Self-pay | Admitting: Family Medicine

## 2018-11-06 VITALS — BP 144/75 | HR 76 | Resp 12 | Ht 66.0 in | Wt 201.1 lb

## 2018-11-06 DIAGNOSIS — R350 Frequency of micturition: Secondary | ICD-10-CM | POA: Diagnosis present

## 2018-11-06 DIAGNOSIS — N3000 Acute cystitis without hematuria: Secondary | ICD-10-CM | POA: Diagnosis present

## 2018-11-06 DIAGNOSIS — B9689 Other specified bacterial agents as the cause of diseases classified elsewhere: Secondary | ICD-10-CM | POA: Diagnosis not present

## 2018-11-06 LAB — POCT URINALYSIS DIP (CLINITEK)
BILIRUBIN UA: NEGATIVE
Blood, UA: NEGATIVE
Glucose, UA: NEGATIVE mg/dL
Ketones, POC UA: NEGATIVE mg/dL
Nitrite, UA: NEGATIVE
POC PROTEIN,UA: NEGATIVE
Spec Grav, UA: 1.02 (ref 1.010–1.025)
Urobilinogen, UA: 0.2 E.U./dL
pH, UA: 7.5 (ref 5.0–8.0)

## 2018-11-06 MED ORDER — NITROFURANTOIN MONOHYD MACRO 100 MG PO CAPS: 100.0000 mg | ORAL_CAPSULE | Freq: Two times a day (BID) | ORAL | 0 refills | Status: DC

## 2018-11-06 NOTE — Patient Instructions (Signed)
Today you were seen and treated for a UTI (acute cystitis).  I sent a prescription to the [facility] pharmacy on [address] for you.  Please take the full course of this prescription. Otherwise, a reinfection can occur and be worse and will cause you to have to take an even strong medication.  We will also send a culture off to make sure the bacteria is sensitive to the medication ordered. If it is not, we might have to change the medication. We will notify you if this happens.   If you have trouble with this medication please notify the office. If you develop a yeast infection post this medication, please let us know. I will gladly sent in a medication to help should this happen.  If you can take this medication with yogurt or a probiotic that will greatly help reduce the side effects of the antibiotic.   Practice good hygiene. If sexual active, please use the restroom post intercourse and clean the peri area well. Always wipe front to back. Avoid scented soaps, douches, or feminine products that can cause irriation to the peri/vagina area.   If you notice fevers, chills, flank/back pain, or increase of symptoms post completion of antibiotic please notify the office.   We hope you feel better soon.     Thank you for coming into the office today. I appreciate the opportunity to provide you with the care for your UTI.   It was a pleasure to see you and I look forward to continuing to work together on your health and well-being. Please do not hesitate to call the office if you need care or have questions about your care.  Have a wonderful day and weekend.  With Gratitude,  Cherly Beach, DNP, AGNP-BC

## 2018-11-06 NOTE — Progress Notes (Signed)
Acute Office Visit  Subjective:    Patient ID: Deanna Bush, female    DOB: 1961/07/31, 58 y.o.   MRN: [id number]  Chief Complaint  Patient presents with  . Urinary Tract Infection    has frequency and pressure at the lower abdomen. symptoms started yesterday. no burning when going    HPI Deanna Bush is a 58 year old female who presents today with increased urine frequency, urgency, and pressure in the lower abdomen. Onset was yesterday. She currently denies any burning sensation. Denies flank pain. Denies kidney stone history. Denies fevers and chills, N/V.     Past Medical History:  Diagnosis Date  . Adenocarcinoma of breast ([place])    left   . Cellulitis of leg, left   . Diabetes mellitus without complication ([place])   . Family history of breast cancer 08/16/2011  . Hyperlipidemia   . Hypothyroidism   . MRSA (methicillin resistant staph aureus) culture positive 08/19/2011     Outpatient Medications Prior to Visit  Medication Sig Dispense Refill  . aspirin EC 81 MG tablet Take 81 mg by mouth daily.    Marland Kitchen atorvastatin (LIPITOR) 10 MG tablet TAKE ONE TABLET (10MG ) BY MOUTH DAILY 90 tablet 1  . EPINEPHRINE 0.3 mg/0.3 mL IJ SOAJ injection INJECT 0.3MLS(1 SYRINGE) INTO THE MUSCLE ONCE AS NEEDED FOR ALLERGIC REACTION 2 Device 0  . levothyroxine (SYNTHROID, LEVOTHROID) 75 MCG tablet Take 1 tablet (75 mcg total) by mouth daily before breakfast. 90 tablet 1  . mometasone (ELOCON) 0.1 % lotion Apply one drop three times daily to affected ear for 5 days, then, as needed 60 mL 0  . Multiple Vitamin (MULTIVITAMIN) tablet Take 1 tablet by mouth daily.      Marland Kitchen UNABLE TO FIND MASTECTOMY BRA AND PROTHESIS DX Z90.12 6 each 2  . UNABLE TO FIND 6 mastectomy prosthesis and bras. 6 each 0   No facility-administered medications prior to visit.     Allergies  Allergen Reactions  . Sulfonamide Derivatives Itching  . Yellow Jacket Venom [Bee Venom] Rash    urticaria    Review of Systems    Constitutional: Negative for chills, fever and weight loss.  Respiratory: Negative for cough and shortness of breath.   Cardiovascular: Negative for chest pain and palpitations.  Gastrointestinal: Negative.   Genitourinary: Positive for frequency and urgency. Negative for flank pain and hematuria.       Pressure in abdomen area. No burning.       Objective:    Physical Exam  Constitutional: She is oriented to person, place, and time. She appears well-developed.  HENT:  Head: Normocephalic.  Cardiovascular: Normal rate and regular rhythm.  Pulmonary/Chest: Effort normal and breath sounds normal.  Abdominal: Soft. Bowel sounds are normal.  Mild tenderness with palpitation over bladder  Musculoskeletal: Normal range of motion.     Comments: No CVA tenderness   Neurological: She is alert and oriented to person, place, and time.  Skin: Skin is warm and dry.  Psychiatric: She has a normal mood and affect. Her behavior is normal. Thought content normal.  Nursing note and vitals reviewed.  Vitals:   11/06/18 0828  BP: (!) 144/75  Pulse: 76  Resp: 12  SpO2: 97%         Assessment & Plan:   1. Frequency of urination Complaints of increased frequency over the last day and half. Checked for UTI: Small counts of leuk cyte seen on dip.    - POCT URINALYSIS  DIP (CLINITEK) - Urine Culture  2. Acute cystitis without hematuria Signs and symptoms of today's office visit is consistent with cystitis without hematuria. Pressure in pelvic region. No burning. But some urgency. And positive dip. Will send out Culture to assess sensitivities, but provided Macrobid for 5 days to cover. Will change if culture reports a sensitivity change.    - nitrofurantoin, macrocrystal-monohydrate, (MACROBID) 100 MG capsule; Take 1 capsule (100 mg total) by mouth 2 (two) times daily.  Dispense: 10 capsule; Refill: 0 - Urine Culture   Encouraged to drink plenty of fluids. Complete the full course of  Macrobid, even if she starts to feel better. Encouraged to practice good vagina/peri hygiene post sexual activity and to wipe from front to back. Educated on avoidances of scented soaps, douches, or feminine products that can cause irriation to the peri/vagina area.   Asked to call if symptoms worsen or fail to improve post completion of antibiotic.   Deanna Mayo, NP

## 2018-11-08 LAB — URINE CULTURE

## 2018-11-12 ENCOUNTER — Other Ambulatory Visit: Payer: Self-pay | Admitting: Family Medicine

## 2018-11-12 ENCOUNTER — Telehealth: Payer: Self-pay | Admitting: Family Medicine

## 2018-11-12 ENCOUNTER — Telehealth: Payer: Self-pay

## 2018-11-12 DIAGNOSIS — N3000 Acute cystitis without hematuria: Secondary | ICD-10-CM

## 2018-11-12 MED ORDER — PHENAZOPYRIDINE HCL 200 MG PO TABS: 200.0000 mg | ORAL_TABLET | Freq: Three times a day (TID) | ORAL | 0 refills | Status: AC

## 2018-11-12 NOTE — Telephone Encounter (Signed)
She was given nitrofuratoin at her visit but still experiencing the same symptoms. Nitro was sensitive but does she need to be changed to a more sensitive drug? Please advise

## 2018-11-12 NOTE — Progress Notes (Signed)
Patient called regarding pressure and pain post posting. She denies urgency or frequency, or burning. Denies fevers, chills, and back pain.  Recently treated for UTI with Nitrofurantion (Culture was sensitive to this) just finished the course of therapy.   Will order her Pyridium for two days. Encourage hydration over the next several days and through the weekend. Asked to call back if symptoms do not resolve or get worse.

## 2018-11-12 NOTE — Telephone Encounter (Signed)
Spoke with patient to clarify her symptoms of Urgency with the current UTI she is having. Patient describes pain with urination, frequency and pressure. Advised that Cherly Beach NP would be calling her in a new prescription for these symptoms

## 2018-11-12 NOTE — Telephone Encounter (Signed)
Deanna Bush is calling in she is still having the pressure of the UTI, the urgency is gone,   Can you please call her to advise if she can have something else.  [phone]

## 2018-11-26 ENCOUNTER — Other Ambulatory Visit: Payer: Self-pay | Admitting: Family Medicine

## 2018-12-03 ENCOUNTER — Ambulatory Visit: Payer: No Typology Code available for payment source | Admitting: Family Medicine

## 2018-12-03 ENCOUNTER — Other Ambulatory Visit (HOSPITAL_COMMUNITY)
Admission: RE | Admit: 2018-12-03 | Discharge: 2018-12-03 | Disposition: A | Discharge status: Home / Self Care | Payer: PRIVATE HEALTH INSURANCE | Source: Ambulatory Visit | Attending: Family Medicine | Admitting: Family Medicine

## 2018-12-03 ENCOUNTER — Encounter: Payer: Self-pay | Admitting: Family Medicine

## 2018-12-03 VITALS — BP 138/80 | HR 69 | Resp 14 | Ht 66.0 in | Wt 200.0 lb

## 2018-12-03 DIAGNOSIS — R3915 Urgency of urination: Secondary | ICD-10-CM | POA: Diagnosis present

## 2018-12-03 DIAGNOSIS — E669 Obesity, unspecified: Secondary | ICD-10-CM | POA: Diagnosis not present

## 2018-12-03 DIAGNOSIS — H66001 Acute suppurative otitis media without spontaneous rupture of ear drum, right ear: Secondary | ICD-10-CM

## 2018-12-03 DIAGNOSIS — E038 Other specified hypothyroidism: Secondary | ICD-10-CM

## 2018-12-03 DIAGNOSIS — H6691 Otitis media, unspecified, right ear: Secondary | ICD-10-CM | POA: Insufficient documentation

## 2018-12-03 LAB — POCT URINALYSIS DIP (CLINITEK)
Bilirubin, UA: NEGATIVE
Blood, UA: NEGATIVE
Glucose, UA: NEGATIVE mg/dL
Ketones, POC UA: NEGATIVE mg/dL
NITRITE UA: NEGATIVE
POC PROTEIN,UA: NEGATIVE
Spec Grav, UA: 1.025 (ref 1.010–1.025)
Urobilinogen, UA: 0.2 E.U./dL
pH, UA: 6.5 (ref 5.0–8.0)

## 2018-12-03 MED ORDER — FLUCONAZOLE 150 MG PO TABS: 150.0000 mg | ORAL_TABLET | Freq: Once | ORAL | 0 refills | Status: AC

## 2018-12-03 MED ORDER — AMPICILLIN 500 MG PO CAPS: 500.0000 mg | ORAL_CAPSULE | Freq: Three times a day (TID) | ORAL | 0 refills | Status: DC

## 2018-12-03 NOTE — Patient Instructions (Signed)
Keep appointment as before, call if you need me before  You are treated right ear infection and your urine is being sent for further testing for infection  Your symptoms are most suggestive of prolapse , not infection

## 2018-12-03 NOTE — Assessment & Plan Note (Addendum)
3 day h/o acute right ear pain, abnormal exam, antibiotic, ampicillin  prescribed

## 2018-12-04 ENCOUNTER — Encounter: Payer: Self-pay | Admitting: Family Medicine

## 2018-12-04 DIAGNOSIS — R3915 Urgency of urination: Secondary | ICD-10-CM | POA: Insufficient documentation

## 2018-12-04 NOTE — Assessment & Plan Note (Signed)
persistent  Urgency  and vaginal pressure, history  More in keeping with prolapse , will assess at next visit per pt preference. CCUA slightly abnormal, sent for c/s

## 2018-12-04 NOTE — Assessment & Plan Note (Signed)
Unchanged. Patient re-educated about  the importance of commitment to a  minimum of 150 minutes of exercise per week as able.  The importance of healthy food choices with portion control discussed, as well as eating regularly and within a 12 hour window most days. The need to choose "clean , green" food 50 to 75% of the time is discussed, as well as to make water the primary drink and set a goal of 64 ounces water daily.  Encouraged to start a food diary,  and to consider  joining a support group. Sample diet sheets offered. Goals set by the patient for the next several months.   Weight /BMI 12/03/2018 11/06/2018 10/29/2018  WEIGHT 200 lb 201 lb 1.9 oz 199 lb  HEIGHT 5\' 6"  5\' 6"  5\' 6"   BMI 32.28 kg/m2 32.46 kg/m2 32.12 kg/m2

## 2018-12-04 NOTE — Progress Notes (Signed)
   Deanna Bush     MRN: [id number]      DOB: August 06, 1961   HPI Deanna Bush is here with c/o ongoing vaginal pressure with urination, denies frequenct, flank pain, fever or chills, feels as though UTI may not be fully treated  ROS Denies recent fever or chills. Denies sinus pressure, nasal congestion, c//o right ear pain and fullness Denies chest congestion, productive cough or wheezing. Denies chest pains, palpitations and leg swelling Denies abdominal pain, nausea, vomiting,diarrhea or constipation.   Denies dysuria, frequency, hesitancy or incontinence. . Denies headaches, seizures, numbness, or tingling. Denies depression, anxiety or insomnia. Denies skin break down or rash.   PE  BP 138/80   Pulse 69   Resp 14   Ht 5\' 6"  (1.676 m)   Wt 200 lb (90.7 kg)   SpO2 99% Comment: room air  BMI 32.28 kg/m   Patient alert and oriented and in no cardiopulmonary distress.  HEENT: No facial asymmetry, EOMI,   oropharynx pink and moist.  Neck supple no JVD, no mass.Right TM dull with reduced light reflex and mild erythema  Chest: Clear to auscultation bilaterally.  CVS: S1, S2 no murmurs, no S3.Regular rate.  ABD: Soft non tender. No suprapubic or flank tenderness  Ext: No edema  MS: Adequate ROM spine, shoulders, hips and knees.  Skin: Intact, no ulcerations or rash noted.  Psych: Good eye contact, normal affect. Memory intact not anxious or depressed appearing.  CNS: CN 2-12 intact, power,  normal throughout.no focal deficits noted.   Assessment & Plan  Right otitis media 3 day h/o acute right ear pain, abnormal exam, antibiotic, ampicillin  prescribed  Urinary urgency persistent  Urgency  and vaginal pressure, history  More in keeping with prolapse , will assess at next visit per pt preference. CCUA slightly abnormal, sent for c/s  Obesity (BMI 30.0-34.9) Unchanged. Patient re-educated about  the importance of commitment to a  minimum of 150 minutes of exercise per  week as able.  The importance of healthy food choices with portion control discussed, as well as eating regularly and within a 12 hour window most days. The need to choose "clean , green" food 50 to 75% of the time is discussed, as well as to make water the primary drink and set a goal of 64 ounces water daily.  Encouraged to start a food diary,  and to consider  joining a support group. Sample diet sheets offered. Goals set by the patient for the next several months.   Weight /BMI 12/03/2018 11/06/2018 10/29/2018  WEIGHT 200 lb 201 lb 1.9 oz 199 lb  HEIGHT 5\' 6"  5\' 6"  5\' 6"   BMI 32.28 kg/m2 32.46 kg/m2 32.12 kg/m2      Hypothyroidism Controlled, no change in medication

## 2018-12-04 NOTE — Assessment & Plan Note (Signed)
Controlled, no change in medication  

## 2018-12-05 LAB — URINE CULTURE

## 2018-12-08 ENCOUNTER — Encounter: Payer: Self-pay | Admitting: Family Medicine

## 2019-04-08 ENCOUNTER — Other Ambulatory Visit: Payer: Self-pay | Admitting: Family Medicine

## 2019-04-23 LAB — COMPLETE METABOLIC PANEL WITH GFR
AG Ratio: 1.5 (calc) (ref 1.0–2.5)
ALT: 12 U/L (ref 6–29)
AST: 14 U/L (ref 10–35)
Albumin: 4.3 g/dL (ref 3.6–5.1)
Alkaline phosphatase (APISO): 66 U/L (ref 37–153)
BUN: 17 mg/dL (ref 7–25)
CO2: 27 mmol/L (ref 20–32)
Calcium: 9.7 mg/dL (ref 8.6–10.4)
Chloride: 105 mmol/L (ref 98–110)
Creat: 0.73 mg/dL (ref 0.50–1.05)
GFR, Est African American: 104 mL/min/{1.73_m2} (ref >=60)
GFR, Est Non African American: 90 mL/min/{1.73_m2} (ref >=60)
Globulin: 2.9 g/dL (calc) (ref 1.9–3.7)
Glucose, Bld: 102 mg/dL — ABNORMAL HIGH (ref 65–99)
POTASSIUM: 4.3 mmol/L (ref 3.5–5.3)
Sodium: 139 mmol/L (ref 135–146)
TOTAL PROTEIN: 7.2 g/dL (ref 6.1–8.1)
Total Bilirubin: 0.6 mg/dL (ref 0.2–1.2)

## 2019-04-23 LAB — CBC
HCT: 38 % (ref 35.0–45.0)
Hemoglobin: 12.1 g/dL (ref 11.7–15.5)
MCH: 30 pg (ref 27.0–33.0)
MCHC: 31.8 g/dL — ABNORMAL LOW (ref 32.0–36.0)
MCV: 94.1 fL (ref 80.0–100.0)
MPV: 10.8 fL (ref 7.5–12.5)
PLATELETS: 278 10*3/uL (ref 140–400)
RBC: 4.04 10*6/uL (ref 3.80–5.10)
RDW: 12.4 % (ref 11.0–15.0)
WBC: 4.1 10*3/uL (ref 3.8–10.8)

## 2019-04-23 LAB — HEMOGLOBIN A1C
Hgb A1c MFr Bld: 6.4 % of total Hgb — ABNORMAL HIGH (ref <5.7)
Mean Plasma Glucose: 137 (calc)
eAG (mmol/L): 7.6 (calc)

## 2019-04-23 LAB — TSH: TSH: 5.03 mIU/L — ABNORMAL HIGH (ref 0.40–4.50)

## 2019-04-29 ENCOUNTER — Ambulatory Visit (INDEPENDENT_AMBULATORY_CARE_PROVIDER_SITE_OTHER): Payer: No Typology Code available for payment source | Admitting: Family Medicine

## 2019-04-29 ENCOUNTER — Other Ambulatory Visit: Payer: Self-pay

## 2019-04-29 ENCOUNTER — Encounter: Payer: Self-pay | Admitting: Family Medicine

## 2019-04-29 VITALS — BP 112/70 | HR 73 | Temp 97.6°F | Resp 15 | Ht 66.0 in | Wt 199.0 lb

## 2019-04-29 DIAGNOSIS — E785 Hyperlipidemia, unspecified: Secondary | ICD-10-CM

## 2019-04-29 DIAGNOSIS — Z1231 Encounter for screening mammogram for malignant neoplasm of breast: Secondary | ICD-10-CM | POA: Diagnosis not present

## 2019-04-29 DIAGNOSIS — Z0000 Encounter for general adult medical examination without abnormal findings: Secondary | ICD-10-CM | POA: Diagnosis not present

## 2019-04-29 DIAGNOSIS — E038 Other specified hypothyroidism: Secondary | ICD-10-CM | POA: Diagnosis not present

## 2019-04-29 DIAGNOSIS — E669 Obesity, unspecified: Secondary | ICD-10-CM

## 2019-04-29 DIAGNOSIS — R7303 Prediabetes: Secondary | ICD-10-CM

## 2019-04-29 DIAGNOSIS — E559 Vitamin D deficiency, unspecified: Secondary | ICD-10-CM

## 2019-04-29 MED ORDER — EPINEPHRINE 0.3 MG/0.3ML IJ SOAJ: INTRAMUSCULAR | 0 refills | Status: DC

## 2019-04-29 MED ORDER — LEVOTHYROXINE SODIUM 75 MCG PO TABS: ORAL_TABLET | ORAL | 1 refills | Status: DC

## 2019-04-29 MED ORDER — LEVOTHYROXINE SODIUM 100 MCG PO TABS: 100.0000 ug | ORAL_TABLET | Freq: Every day | ORAL | 3 refills | Status: DC

## 2019-04-29 NOTE — Patient Instructions (Signed)
F/U early December, call if you need me before  Please schedule November mammogram at checkout  Increase dose of l thyroxine to 100 mcg daily  It is important that you exercise regularly at least 30 minutes 5 times a week. If you develop chest pain, have severe difficulty breathing, or feel very tired, stop exercising immediately and seek medical attention   Fasting lipid, cmp and EGFR and hBA1C and TSH and vit D 1 week before checkout   

## 2019-04-29 NOTE — Progress Notes (Signed)
    Deanna Bush     MRN: [id number]      DOB: November 02, 1960  HPI: Patient is in for annual physical exam. No other health concerns are expressed or addressed at the visit. Recent labs, i are reviewed.and medication adjustmen for thyroid replacement Immunization is reviewed , and  Is up to date  PE: BP 112/70   Pulse 73   Temp 97.6 F (36.4 C) (Temporal)   Resp 15   Ht 5\' 6"  (1.676 m)   Wt 199 lb (90.3 kg)   SpO2 97%   BMI 32.12 kg/m   Pleasant  female, alert and oriented x 3, in no cardio-pulmonary distress. Afebrile. HEENT No facial trauma or asymetry. Sinuses non tender.  Extra occullar muscles intact, External ears normal,  Neck: supple, no adenopathy,JVD or thyromegaly.No bruits.  Chest: Clear to ascultation bilaterally.No crackles or wheezes. Non tender to palpation   Cardiovascular system; Heart sounds normal,  S1 and  S2 ,no S3.  No murmur, or thrill. Apical beat not displaced Peripheral pulses normal.  Abdomen: Soft, non tender, Bowel sounds normal. No guarding, tenderness or rebound.   Musculoskeletal exam: Full ROM of spine, hips , shoulders and knees. No deformity ,swelling or crepitus noted. No muscle wasting or atrophy.   Neurologic: Cranial nerves 2 to 12 intact. Power, tone ,sensation and reflexes normal throughout. No disturbance in gait. No tremor.  Skin: Intact, no ulceration, erythema , scaling or rash noted. Pigmentation normal throughout  Psych; Normal mood and affect. Judgement and concentration normal   Assessment & Plan:  Annual physical exam Annual exam as documented. Counseling done  re healthy lifestyle involving commitment to 150 minutes exercise per week, heart healthy diet, and attaining healthy weight.The importance of adequate sleep also discussed. Regular seat belt use and home safety, is also discussed. Changes in health habits are decided on by the patient with goals and time frames  set for achieving them.  Immunization and cancer screening needs are specifically addressed at this visit.   Obesity (BMI 30.0-34.9)  Patient re-educated about  the importance of commitment to a  minimum of 150 minutes of exercise per week as able.  The importance of healthy food choices with portion control discussed, as well as eating regularly and within a 12 hour window most days. The need to choose "clean , green" food 50 to 75% of the time is discussed, as well as to make water the primary drink and set a goal of 64 ounces water daily.    Weight /BMI 04/29/2019 12/03/2018 11/06/2018  WEIGHT 199 lb 200 lb 201 lb 1.9 oz  HEIGHT 5\' 6"  5\' 6"  5\' 6"   BMI 32.12 kg/m2 32.28 kg/m2 32.46 kg/m2      Hypothyroidism Under corrected, dose increase to lthyroxine 100 mcg daily

## 2019-05-01 ENCOUNTER — Encounter: Payer: Self-pay | Admitting: Family Medicine

## 2019-05-01 NOTE — Assessment & Plan Note (Signed)
Under corrected, dose increase to lthyroxine 100 mcg daily

## 2019-05-01 NOTE — Assessment & Plan Note (Signed)

## 2019-05-01 NOTE — Assessment & Plan Note (Signed)
  Patient re-educated about  the importance of commitment to a  minimum of 150 minutes of exercise per week as able.  The importance of healthy food choices with portion control discussed, as well as eating regularly and within a 12 hour window most days. The need to choose "clean , green" food 50 to 75% of the time is discussed, as well as to make water the primary drink and set a goal of 64 ounces water daily.    Weight /BMI 04/29/2019 12/03/2018 11/06/2018  WEIGHT 199 lb 200 lb 201 lb 1.9 oz  HEIGHT 5\' 6"  5\' 6"  5\' 6"   BMI 32.12 kg/m2 32.28 kg/m2 32.46 kg/m2

## 2019-05-15 ENCOUNTER — Other Ambulatory Visit: Payer: Self-pay | Admitting: Family Medicine

## 2019-06-28 ENCOUNTER — Ambulatory Visit (INDEPENDENT_AMBULATORY_CARE_PROVIDER_SITE_OTHER): Payer: No Typology Code available for payment source

## 2019-06-28 ENCOUNTER — Other Ambulatory Visit: Payer: Self-pay

## 2019-06-28 DIAGNOSIS — Z23 Encounter for immunization: Secondary | ICD-10-CM | POA: Diagnosis not present

## 2019-07-23 ENCOUNTER — Other Ambulatory Visit: Payer: Self-pay

## 2019-07-23 DIAGNOSIS — Z20822 Contact with and (suspected) exposure to covid-19: Secondary | ICD-10-CM

## 2019-07-24 LAB — NOVEL CORONAVIRUS, NAA: SARS-CoV-2, NAA: NOT DETECTED

## 2019-07-26 ENCOUNTER — Telehealth: Payer: Self-pay | Admitting: General Practice

## 2019-07-26 NOTE — Telephone Encounter (Signed)
Negative COVID results given. Patient results "NOT Detected." Caller expressed understanding. ° °

## 2019-09-06 ENCOUNTER — Other Ambulatory Visit: Payer: Self-pay

## 2019-09-06 ENCOUNTER — Ambulatory Visit (HOSPITAL_COMMUNITY)
Admission: RE | Admit: 2019-09-06 | Discharge: 2019-09-06 | Disposition: A | Discharge status: Home / Self Care | Payer: No Typology Code available for payment source | Source: Ambulatory Visit | Attending: Family Medicine | Admitting: Family Medicine

## 2019-09-06 DIAGNOSIS — Z1231 Encounter for screening mammogram for malignant neoplasm of breast: Secondary | ICD-10-CM | POA: Diagnosis present

## 2019-09-13 ENCOUNTER — Ambulatory Visit (INDEPENDENT_AMBULATORY_CARE_PROVIDER_SITE_OTHER): Payer: No Typology Code available for payment source | Admitting: Family Medicine

## 2019-09-13 ENCOUNTER — Other Ambulatory Visit: Payer: Self-pay

## 2019-09-13 ENCOUNTER — Encounter: Payer: Self-pay | Admitting: Family Medicine

## 2019-09-13 VITALS — BP 125/76 | Ht 66.0 in | Wt 190.0 lb

## 2019-09-13 DIAGNOSIS — E038 Other specified hypothyroidism: Secondary | ICD-10-CM | POA: Diagnosis not present

## 2019-09-13 DIAGNOSIS — R7303 Prediabetes: Secondary | ICD-10-CM | POA: Diagnosis not present

## 2019-09-13 DIAGNOSIS — Z1211 Encounter for screening for malignant neoplasm of colon: Secondary | ICD-10-CM

## 2019-09-13 DIAGNOSIS — C50912 Malignant neoplasm of unspecified site of left female breast: Secondary | ICD-10-CM

## 2019-09-13 DIAGNOSIS — E669 Obesity, unspecified: Secondary | ICD-10-CM

## 2019-09-13 DIAGNOSIS — E785 Hyperlipidemia, unspecified: Secondary | ICD-10-CM

## 2019-09-13 MED ORDER — UNABLE TO FIND: 0 refills | Status: DC

## 2019-09-13 NOTE — Progress Notes (Signed)
Virtual Visit via Telephone Note  I connected with RHODESIA BRANDLE on 09/13/19 at  3:20 PM EST by telephone and verified that I am speaking with the correct person using two identifiers.  Location: Patient:store Provider: office   I discussed the limitations, risks, security and privacy concerns of performing an evaluation and management service by telephone and the availability of in person appointments. I also discussed with the patient that there may be a patient responsible charge related to this service. The patient expressed understanding and agreed to proceed.   History of Present Illness: F/U chronic problems and lab review. Reports doing well Requests that script be sent for mastectomy supplies , as in  Her left prosthesis be sent in now,  and also bras when needed Still committed to regular exercise and healthy food choice Denies recent fever or chills. Denies sinus pressure, nasal congestion, ear pain or sore throat. Denies chest congestion, productive cough or wheezing. Denies chest pains, palpitations and leg swelling Denies abdominal pain, nausea, vomiting,diarrhea or constipation.   Denies dysuria, frequency, hesitancy or incontinence. Denies joint pain, swelling and limitation in mobility. Denies headaches, seizures, numbness, or tingling. Denies depression, anxiety or insomnia. Denies skin break down or rash.       Observations/Objective:  BP 125/76   Ht 5\' 6"  (1.676 m)   Wt 190 lb (86.2 kg)   BMI 30.67 kg/m  Good communication with no confusion and intact memory. Alert and oriented x 3 No signs of respiratory distress during speech   Assessment and Plan:  Hypothyroidism Controlled, no change in medication Updated lab needed at/ before next visit.   Hyperlipidemia with target LDL less than 100 Hyperlipidemia:Low fat diet discussed and encouraged.   Lipid Panel  Lab Results  Component Value Date   CHOL 185 10/23/2018   HDL 69 10/23/2018   LDLCALC 100 (H) 10/23/2018   TRIG 74 10/23/2018   CHOLHDL 2.7 10/23/2018     Needs to lower fat Updated lab needed at/ before next visit.   Prediabetes Patient educated about the importance of limiting  Carbohydrate intake , the need to commit to daily physical activity for a minimum of 30 minutes , and to commit weight loss. The fact that changes in all these areas will reduce or eliminate all together the development of diabetes is stressed.   Diabetic Labs Latest Ref Rng & Units 04/22/2019 10/23/2018 03/16/2018 10/11/2017 06/17/2017  HbA1c <5.7 % of total Hgb 6.4(H) 6.4(H) 6.3(H) 6.2(H) -  Microalbumin mg/dL - 0.2 - - <0.2  Micro/Creat Ratio <30 mcg/mg creat - - - - NOTE  Chol <200 mg/dL - 185 180 - -  HDL >50 mg/dL - 69 63 - -  Calc LDL mg/dL (calc) - 100(H) 103(H) - -  Triglycerides <150 mg/dL - 74 59 - -  Creatinine 0.50 - 1.05 mg/dL 0.73 0.76 0.65 0.81 -   BP/Weight 09/13/2019 04/29/2019 12/03/2018 11/06/2018 10/29/2018 05/05/2018 [id number]  Systolic BP 0000000 [id number] 0000000 123456 [id number] [id number]  Diastolic BP 76 70 80 75 60 62 78  Wt. (Lbs) 190 199 200 201.12 199 191.08 193  BMI 30.67 32.12 32.28 32.46 32.12 30.84 31.15   Foot/eye exam completion dates Latest Ref Rng & Units 10/29/2018 06/17/2017  Eye Exam No Retinopathy - -  Foot Form Completion - Done Done  Updated lab needed at/ before next visit.     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 The patient was provided an opportunity to ask questions and all were answered. The patient agreed with the plan and demonstrated an understanding of the instructions.   The patient was advised to call back or seek an in-person evaluation if the symptoms worsen or if the condition fails to improve as anticipated.  I provided 15 minutes of non-face-to-face time during this  encounter.   Tula Nakayama, MD

## 2019-09-13 NOTE — Patient Instructions (Addendum)
F/U in office with MD early May, call if you need me sooner  Fasting lipid,  cmp and eGFr, hBA1C and TSH 2nd week in January ( [facility])  Script to 2nd to nature for prosthesis, currently due, left breast  congrats on weight loss, keep it up  You are being referred t Dr Oneida Alar for  screening colonscopy  It is important that you exercise regularly at least 30 minutes 5 times a week. If you develop chest pain, have severe difficulty breathing, or feel very tired, stop exercising immediately and seek medical attention  Think about what you will eat, plan ahead. Choose " clean, green, fresh or frozen" over canned, processed or packaged foods which are more sugary, salty and fatty. 70 to 75% of food eaten should be vegetables and fruit. Three meals at set times with snacks allowed between meals, but they must be fruit or vegetables. Aim to eat over a 12 hour period , example 7 am to 7 pm, and STOP after  your last meal of the day. Drink water,generally about 64 ounces per day, no other drink is as healthy. Fruit juice is best enjoyed in a healthy way, by EATING the fruit. Thanks for choosing [facility], we consider it a privelige to serve you.

## 2019-09-15 ENCOUNTER — Encounter: Payer: Self-pay | Admitting: Gastroenterology

## 2019-09-19 NOTE — Assessment & Plan Note (Signed)
Patient educated about the importance of limiting  Carbohydrate intake , the need to commit to daily physical activity for a minimum of 30 minutes , and to commit weight loss. The fact that changes in all these areas will reduce or eliminate all together the development of diabetes is stressed.   Diabetic Labs Latest Ref Rng & Units 04/22/2019 10/23/2018 03/16/2018 10/11/2017 06/17/2017  HbA1c <5.7 % of total Hgb 6.4(H) 6.4(H) 6.3(H) 6.2(H) -  Microalbumin mg/dL - 0.2 - - <0.2  Micro/Creat Ratio <30 mcg/mg creat - - - - NOTE  Chol <200 mg/dL - 185 180 - -  HDL >50 mg/dL - 69 63 - -  Calc LDL mg/dL (calc) - 100(H) 103(H) - -  Triglycerides <150 mg/dL - 74 59 - -  Creatinine 0.50 - 1.05 mg/dL 0.73 0.76 0.65 0.81 -   BP/Weight 09/13/2019 04/29/2019 12/03/2018 11/06/2018 10/29/2018 05/05/2018 [id number]  Systolic BP 0000000 [id number] 0000000 123456 [id number] 123456  Diastolic BP 76 70 80 75 60 62 78  Wt. (Lbs) 190 199 200 201.12 199 191.08 193  BMI 30.67 32.12 32.28 32.46 32.12 30.84 31.15   Foot/eye exam completion dates Latest Ref Rng & Units 10/29/2018 06/17/2017  Eye Exam No Retinopathy - -  Foot Form Completion - Done Done  Updated lab needed at/ before next visit.

## 2019-09-19 NOTE — Assessment & Plan Note (Signed)
Controlled, no change in medication Updated lab needed at/ before next visit.  

## 2019-09-19 NOTE — Assessment & Plan Note (Signed)
Hyperlipidemia:Low fat diet discussed and encouraged.   Lipid Panel  Lab Results  Component Value Date   CHOL 185 10/23/2018   HDL 69 10/23/2018   LDLCALC 100 (H) 10/23/2018   TRIG 74 10/23/2018   CHOLHDL 2.7 10/23/2018     Needs to lower fat Updated lab needed at/ before next visit.

## 2019-09-19 NOTE — Assessment & Plan Note (Signed)
Left prosthesis needs replacement, script to be sent to her supplier, 2nd to [facility]

## 2019-11-01 ENCOUNTER — Other Ambulatory Visit: Payer: Self-pay

## 2019-11-01 MED ORDER — ATORVASTATIN CALCIUM 10 MG PO TABS: ORAL_TABLET | ORAL | 1 refills | Status: DC

## 2019-11-08 ENCOUNTER — Ambulatory Visit: Payer: No Typology Code available for payment source

## 2019-11-15 ENCOUNTER — Telehealth: Payer: Self-pay

## 2019-11-15 DIAGNOSIS — E785 Hyperlipidemia, unspecified: Secondary | ICD-10-CM

## 2019-11-15 DIAGNOSIS — R7301 Impaired fasting glucose: Secondary | ICD-10-CM

## 2019-11-15 DIAGNOSIS — E669 Obesity, unspecified: Secondary | ICD-10-CM

## 2019-11-15 DIAGNOSIS — E038 Other specified hypothyroidism: Secondary | ICD-10-CM

## 2019-11-15 NOTE — Telephone Encounter (Signed)
Labs ordered.

## 2019-11-16 LAB — LIPID PANEL WITH LDL/HDL RATIO
Cholesterol, Total: 180 mg/dL (ref 100–199)
HDL: 65 mg/dL (ref >39)
LDL Chol Calc (NIH): 104 mg/dL — ABNORMAL HIGH (ref 0–99)
LDL/HDL Ratio: 1.6 ratio (ref 0.0–3.2)
Triglycerides: 54 mg/dL (ref 0–149)
VLDL Cholesterol Cal: 11 mg/dL (ref 5–40)

## 2019-11-16 LAB — CMP14+EGFR
ALT: 13 IU/L (ref 0–32)
AST: 19 IU/L (ref 0–40)
Albumin/Globulin Ratio: 1.4 (ref 1.2–2.2)
Albumin: 4.2 g/dL (ref 3.8–4.9)
Alkaline Phosphatase: 92 IU/L (ref 39–117)
BUN/Creatinine Ratio: 18 (ref 12–28)
BUN: 13 mg/dL (ref 8–27)
Bilirubin Total: 0.3 mg/dL (ref 0.0–1.2)
CO2: 24 mmol/L (ref 20–29)
Calcium: 9.7 mg/dL (ref 8.7–10.3)
Chloride: 107 mmol/L — ABNORMAL HIGH (ref 96–106)
Creatinine, Ser: 0.73 mg/dL (ref 0.57–1.00)
GFR calc Af Amer: 104 mL/min/{1.73_m2} (ref >59)
GFR calc non Af Amer: 90 mL/min/{1.73_m2} (ref >59)
Globulin, Total: 3 g/dL (ref 1.5–4.5)
Glucose: 107 mg/dL — ABNORMAL HIGH (ref 65–99)
Potassium: 4.5 mmol/L (ref 3.5–5.2)
Sodium: 142 mmol/L (ref 134–144)
Total Protein: 7.2 g/dL (ref 6.0–8.5)

## 2019-11-16 LAB — HEMOGLOBIN A1C
Est. average glucose Bld gHb Est-mCnc: 140 mg/dL
Hgb A1c MFr Bld: 6.5 % — ABNORMAL HIGH (ref 4.8–5.6)

## 2019-11-16 LAB — TSH: TSH: 0.054 u[IU]/mL — ABNORMAL LOW (ref 0.450–4.500)

## 2019-11-17 ENCOUNTER — Other Ambulatory Visit: Payer: Self-pay | Admitting: Family Medicine

## 2019-11-17 MED ORDER — LEVOTHYROXINE SODIUM 50 MCG PO TABS: ORAL_TABLET | ORAL | 3 refills | Status: DC

## 2019-11-18 ENCOUNTER — Other Ambulatory Visit: Payer: Self-pay

## 2019-11-18 NOTE — Addendum Note (Signed)
Addended by: Eual Fines on: 11/18/2019 09:04 AM   Modules accepted: Orders

## 2020-01-04 ENCOUNTER — Other Ambulatory Visit: Payer: Self-pay | Admitting: Family Medicine

## 2020-01-04 DIAGNOSIS — E039 Hypothyroidism, unspecified: Secondary | ICD-10-CM

## 2020-01-04 NOTE — Progress Notes (Signed)
[facility]  

## 2020-01-05 ENCOUNTER — Encounter: Payer: Self-pay | Admitting: Family Medicine

## 2020-01-07 LAB — TSH: TSH: 30.55 mIU/L — ABNORMAL HIGH (ref 0.40–4.50)

## 2020-01-07 LAB — TEST AUTHORIZATION

## 2020-01-07 LAB — T4, FREE: Free T4: 0.7 ng/dL — ABNORMAL LOW (ref 0.8–1.8)

## 2020-01-07 LAB — T3, FREE: T3, Free: 1.5 pg/mL — ABNORMAL LOW (ref 2.3–4.2)

## 2020-01-11 ENCOUNTER — Ambulatory Visit: Payer: No Typology Code available for payment source

## 2020-01-11 ENCOUNTER — Encounter: Payer: Self-pay | Admitting: Orthopaedic Surgery

## 2020-01-11 ENCOUNTER — Other Ambulatory Visit: Payer: Self-pay

## 2020-01-11 ENCOUNTER — Other Ambulatory Visit: Payer: Self-pay | Admitting: Family Medicine

## 2020-01-11 ENCOUNTER — Ambulatory Visit (INDEPENDENT_AMBULATORY_CARE_PROVIDER_SITE_OTHER): Payer: No Typology Code available for payment source | Admitting: Orthopaedic Surgery

## 2020-01-11 VITALS — BP 137/71 | HR 56 | Temp 98.8°F | Ht 66.0 in | Wt 201.0 lb

## 2020-01-11 DIAGNOSIS — M25562 Pain in left knee: Secondary | ICD-10-CM

## 2020-01-11 DIAGNOSIS — G8929 Other chronic pain: Secondary | ICD-10-CM

## 2020-01-11 NOTE — Progress Notes (Signed)
Subjective:    Patient ID: Deanna Bush, female    DOB: [birth year], 59 y.o.   MRN: [id number]  HPI She has pain in the left knee.  She was seen by Dr. Aline Brochure for the left knee in 2013.  She started having more and more pain of the left knee for the last eight to ten days. She has swelling and popping but no giving way.  She has no trauma.  She has no redness.  Nothing seems to help.  Advil did not help.     Review of Systems  Constitutional: Positive for activity change.  Musculoskeletal: Positive for arthralgias, gait problem and joint swelling.  All other systems reviewed and are negative.  For Review of Systems, all other systems reviewed and are negative.  The following is a summary of the past history medically, past history surgically, known current medicines, social history and family history.  This information is gathered electronically by the computer from prior information and documentation.  I review this each visit and have found including this information at this point in the chart is beneficial and informative.   Past Medical History:  Diagnosis Date  . 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 Cellulitis of leg, left   . Diabetes mellitus without complication ([place])   . Family history of breast cancer 08/16/2011  . Hyperlipidemia   . Hypothyroidism   . MRSA (methicillin resistant staph aureus) culture positive 08/19/2011    Past Surgical History:  Procedure Laterality Date  . BREAST SURGERY Left    mastectomy  . CESAREAN SECTION     x2  . left mastectomy      Current Outpatient Medications on File Prior to Visit  Medication Sig Dispense Refill  . aspirin EC 81 MG tablet Take 81 mg by mouth daily.    Marland Kitchen atorvastatin (LIPITOR) 10 MG tablet TAKE 1 TABLET BY MOUTH DAILY 90 tablet 1  . EPINEPHrine 0.3 mg/0.3 mL IJ SOAJ injection INJECT 0.3MLS(1 SYRINGE) INTO THE MUSCLE ONCE AS NEEDED FOR ALLERGIC REACTION 2 each 0  . levothyroxine (SYNTHROID) 100 MCG tablet  Take 1 tablet (100 mcg total) by mouth daily. 90 tablet 3  . levothyroxine (SYNTHROID) 50 MCG tablet Take one half tablet every Monday, Wednesday , Friday and Sunday, and take one tablet every Tuesday, Thursday and Saturday 80 tablet 3  . Multiple Vitamin (MULTIVITAMIN) tablet Take 1 tablet by mouth daily.      Marland Kitchen UNABLE TO FIND MASTECTOMY BRA AND PROTHESIS DX Z90.12 6 each 2  . UNABLE TO FIND 6 mastectomy prosthesis and bras. 6 each 0   No current facility-administered medications on file prior to visit.    Social History   Socioeconomic History  . Marital status: Married    Spouse name: Not on file  . Number of children: 4  . Years of education: Not on file  . Highest education level: Not on file  Occupational History  . Not on file  Tobacco Use  . Smoking status: Former Smoker    Packs/day: 0.50    Years: 18.00    Pack years: 9.00    Types: Cigarettes    Quit date: 07/01/2002    Years since quitting: 17.5  . Smokeless tobacco: Never Used  Substance and Sexual Activity  . Alcohol use: No  . Drug use: No  . Sexual activity: Not Currently  Other Topics Concern  . Not on file  Social History Narrative  . Not  on file   Social Determinants of Health   Financial Resource Strain:   . Difficulty of Paying Living Expenses:   Food Insecurity:   . Worried About Charity fundraiser in the Last Year:   . Arboriculturist in the Last Year:   Transportation Needs:   . Film/video editor (Medical):   Marland Kitchen Lack of Transportation (Non-Medical):   Physical Activity:   . Days of Exercise per Week:   . Minutes of Exercise per Session:   Stress:   . Feeling of Stress :   Social Connections:   . Frequency of Communication with Friends and Family:   . Frequency of Social Gatherings with Friends and Family:   . Attends Religious Services:   . Active Member of Clubs or Organizations:   . Attends Archivist Meetings:   Marland Kitchen Marital Status:   Intimate Partner Violence:   .  Fear of Current or Ex-Partner:   . Emotionally Abused:   Marland Kitchen Physically Abused:   . Sexually Abused:     Family History  Problem Relation Age of Onset  . Hypertension Mother   . Diabetes Mother   . COPD Brother   . Cancer Sister        breast  . Cancer Sister        breast    BP 137/71   Pulse (!) 56   Temp 98.8 F (37.1 C)   Ht 5\' 6"  (1.676 m)   Wt 201 lb (91.2 kg)   BMI 32.44 kg/m   Body mass index is 32.44 kg/m.     Objective:   Physical Exam Vitals and nursing note reviewed.  Constitutional:      Appearance: She is well-developed.  HENT:     Head: Normocephalic and atraumatic.  Eyes:     Conjunctiva/sclera: Conjunctivae normal.     Pupils: Pupils are equal, round, and reactive to light.  Cardiovascular:     Rate and Rhythm: Normal rate and regular rhythm.  Pulmonary:     Effort: Pulmonary effort is normal.  Abdominal:     Palpations: Abdomen is soft.  Musculoskeletal:     Cervical back: Normal range of motion and neck supple.       Legs:  Skin:    General: Skin is warm and dry.  Neurological:     Mental Status: She is alert and oriented to person, place, and time.     Cranial Nerves: No cranial nerve deficit.     Motor: No abnormal muscle tone.     Coordination: Coordination normal.     Deep Tendon Reflexes: Reflexes are normal and symmetric. Reflexes normal.  Psychiatric:        Behavior: Behavior normal.        Thought Content: Thought content normal.        Judgment: Judgment normal.      X-rays were done of the left knee, reported separately.     Assessment & Plan:   Encounter Diagnosis  Name Primary?  . Chronic pain of left knee Yes   PROCEDURE NOTE:  The patient request injection, verbal consent was obtained.  The left knee was prepped appropriately after time out was performed.   Sterile technique was observed and anesthesia was provided by ethyl chloride and a 20-gauge needle was used to inject the knee area.  A 16-gauge  needle was then used to aspirate the knee.  Color of fluid aspirated was straw  Total cc's aspirated was  75.    Injection of 1 cc of Depo-Medrol 40 mg with several cc's of plain xylocaine was then performed.  A band aid dressing was applied.  The patient was advised to apply ice later today and tomorrow to the injection sight as needed.  Return in two weeks.  She may need MRI.  Take Advil or Aleve.  Call if any problem.  Precautions discussed.   Electronically Signed Sanjuana Kava, MD 4/6/20212:05 PM

## 2020-01-13 ENCOUNTER — Ambulatory Visit (HOSPITAL_COMMUNITY)
Admission: RE | Admit: 2020-01-13 | Discharge: 2020-01-13 | Disposition: A | Discharge status: Home / Self Care | Payer: No Typology Code available for payment source | Source: Ambulatory Visit | Attending: Family Medicine | Admitting: Family Medicine

## 2020-01-13 ENCOUNTER — Other Ambulatory Visit: Payer: Self-pay

## 2020-01-13 DIAGNOSIS — E039 Hypothyroidism, unspecified: Secondary | ICD-10-CM | POA: Insufficient documentation

## 2020-01-18 ENCOUNTER — Other Ambulatory Visit: Payer: Self-pay

## 2020-01-18 ENCOUNTER — Ambulatory Visit (INDEPENDENT_AMBULATORY_CARE_PROVIDER_SITE_OTHER): Payer: Self-pay | Admitting: *Deleted

## 2020-01-18 DIAGNOSIS — Z1211 Encounter for screening for malignant neoplasm of colon: Secondary | ICD-10-CM

## 2020-01-18 MED ORDER — NA SULFATE-K SULFATE-MG SULF 17.5-3.13-1.6 GM/177ML PO SOLN: 1.0000 | Freq: Once | ORAL | 0 refills | Status: AC

## 2020-01-18 NOTE — Patient Instructions (Signed)
Deanna Bush  19-Jul-1961 MRN: [id number]     Procedure Date: 03/03/2020 Time to register: 11:00 am Place to register: [facility] Procedure Time: 12:00 pm Scheduled provider: Dr. Gala Romney    PREPARATION FOR COLONOSCOPY WITH SUPREP BOWEL PREP KIT  Note: Suprep Bowel Prep Kit is a split-dose (2day) regimen. Consumption of BOTH 6-ounce bottles is required for a complete prep.  Please notify us immediately if you are diabetic, take iron supplements, or if you are on Coumadin or any other blood thinners.  Please hold the following medications: n/a                                                                                                                                                  2 DAYS BEFORE PROCEDURE:  DATE: 03/01/2020   DAY: Wednesday Begin clear liquid diet AFTER your lunch meal. NO SOLID FOODS after this point.  1 DAY BEFORE PROCEDURE:  DATE: 03/02/2020   DAY: Thursday Continue clear liquids the entire day - NO SOLID FOOD.   Diabetic medications adjustments for today: n/a  At 6:00pm: Complete steps 1 through 4 below, using ONE (1) 6-ounce bottle, before going to bed. Step 1:  Pour ONE (1) 6-ounce bottle of SUPREP liquid into the mixing container.  Step 2:  Add cool drinking water to the 16 ounce line on the container and mix.  Note: Dilute the solution concentrate as directed prior to use. Step 3:  DRINK ALL the liquid in the container. Step 4:  You MUST drink an additional two (2) or more 16 ounce containers of water over the next one (1) hour.   Continue clear liquids.  DAY OF PROCEDURE:   DATE: 03/03/2020  DAY: Friday If you take medications for your heart, blood pressure, or breathing, you may take these medications.  Diabetic medications adjustments for today: n/a  5 hours before your procedure at 7:00 am : Step 1:  Pour ONE (1) 6-ounce bottle of SUPREP liquid into the mixing container.  Step 2:  Add cool drinking water to the 16 ounce line on the  container and mix.  Note: Dilute the solution concentrate as directed prior to use. Step 3:  DRINK ALL the liquid in the container. Step 4:  You MUST drink an additional two (2) or more 16 ounce containers of water over the next one (1) hour. You MUST complete the final glass of water at least 3 hours before your colonoscopy. Nothing by mouth past 9:00 am.  You may take your morning medications with sip of water unless we have instructed otherwise.    Please see below for Dietary Information.  CLEAR LIQUIDS INCLUDE:  Water Jello (NOT red in color)   Ice Popsicles (NOT red in color)   Tea (sugar ok, no milk/cream) Powdered fruit flavored drinks  Coffee (sugar ok, no  milk/cream) Gatorade/ Lemonade/ Kool-Aid  (NOT red in color)   Juice: apple, white grape, white cranberry Soft drinks  Clear bullion, consomme, broth (fat free beef/chicken/vegetable)  Carbonated beverages (any kind)  Strained chicken noodle soup Hard Candy   Remember: Clear liquids are liquids that will allow you to see your fingers on the other side of a clear glass. Be sure liquids are NOT red in color, and not cloudy, but CLEAR.  DO NOT EAT OR DRINK ANY OF THE FOLLOWING:  Dairy products of any kind   Cranberry juice Tomato juice / V8 juice   Grapefruit juice Orange juice     Red grape juice  Do not eat any solid foods, including such foods as: cereal, oatmeal, yogurt, fruits, vegetables, creamed soups, eggs, bread, crackers, pureed foods in a blender, etc.   HELPFUL HINTS FOR DRINKING PREP SOLUTION:   Make sure prep is extremely cold. Mix and refrigerate the the morning of the prep. You may also put in the freezer.   You may try mixing some Crystal Light or Country Time Lemonade if you prefer. Mix in small amounts; add more if necessary.  Try drinking through a straw  Rinse mouth with water or a mouthwash between glasses, to remove after-taste.  Try sipping on a cold beverage /ice/ popsicles between glasses of  prep.  Place a piece of sugar-free hard candy in mouth between glasses.  If you become nauseated, try consuming smaller amounts, or stretch out the time between glasses. Stop for 30-60 minutes, then slowly start back drinking.     OTHER INSTRUCTIONS  You will need a responsible adult at least 61 years of age to accompany you and drive you home. This person must remain in the waiting room during your procedure. The hospital will cancel your procedure if you do not have a responsible adult with you.   1. Wear loose fitting clothing that is easily removed. 2. Leave jewelry and other valuables at home.  3. Remove all body piercing jewelry and leave at home. 4. Total time from sign-in until discharge is approximately 2-3 hours. 5. You should go home directly after your procedure and rest. You can resume normal activities the day after your procedure. 6. The day of your procedure you should not:  Drive  Make legal decisions  Operate machinery  Drink alcohol  Return to work   You may call the office (Dept: [phone]) before 5:00pm, or page the doctor on call [phone]) after 5:00pm, for further instructions, if necessary.   Insurance Information YOU WILL NEED TO CHECK WITH YOUR INSURANCE COMPANY FOR THE BENEFITS OF COVERAGE YOU HAVE FOR THIS PROCEDURE.  UNFORTUNATELY, NOT ALL INSURANCE COMPANIES HAVE BENEFITS TO COVER ALL OR PART OF THESE TYPES OF PROCEDURES.  IT IS YOUR RESPONSIBILITY TO CHECK YOUR BENEFITS, HOWEVER, WE WILL BE GLAD TO ASSIST YOU WITH ANY CODES YOUR INSURANCE COMPANY MAY NEED.    PLEASE NOTE THAT MOST INSURANCE COMPANIES WILL NOT COVER A SCREENING COLONOSCOPY FOR PEOPLE UNDER THE AGE OF 50  IF YOU HAVE BCBS INSURANCE, YOU MAY HAVE BENEFITS FOR A SCREENING COLONOSCOPY BUT IF POLYPS ARE FOUND THE DIAGNOSIS WILL CHANGE AND THEN YOU MAY HAVE A DEDUCTIBLE THAT WILL NEED TO BE MET. SO PLEASE MAKE SURE YOU CHECK YOUR BENEFITS FOR A SCREENING COLONOSCOPY AS WELL AS A  DIAGNOSTIC COLONOSCOPY.

## 2020-01-18 NOTE — Progress Notes (Addendum)
Gastroenterology Pre-Procedure Review  Request Date: 01/18/2020 Requesting Physician: Dr. Moshe Cipro, Last TCS 11/06/2009 done by Dr. Oneida Alar, hyperplastic polyp  PATIENT REVIEW QUESTIONS: The patient responded to the following health history questions as indicated:    1. Diabetes Melitis: no 2. Joint replacements in the past 12 months: no 3. Major health problems in the past 3 months: no 4. Has an artificial valve or MVP: no 5. Has a defibrillator: no 6. Has been advised in past to take antibiotics in advance of a procedure like teeth cleaning: no 7. Family history of colon cancer: yes, aunt: age 71   8. Alcohol Use: yes, 1 wine once a month 9. Illicit drug Use: no 10. History of sleep apnea: no  11. History of coronary artery or other vascular stents placed within the last 12 months: no 12. History of any prior anesthesia complications: no 13. There is no height or weight on file to calculate BMI. ht: 5'6 wt: 200 lbs    MEDICATIONS & ALLERGIES:    Patient reports the following regarding taking any blood thinners:   Plavix? no Aspirin? yes Coumadin? no Brilinta? no Xarelto? no Eliquis? no Pradaxa? no Savaysa? no Effient? no  Patient confirms/reports the following medications:  Current Outpatient Medications  Medication Sig Dispense Refill  . aspirin EC 81 MG tablet Take 81 mg by mouth daily.    Marland Kitchen atorvastatin (LIPITOR) 10 MG tablet TAKE 1 TABLET BY MOUTH DAILY 90 tablet 1  . EPINEPHrine 0.3 mg/0.3 mL IJ SOAJ injection INJECT 0.3MLS(1 SYRINGE) INTO THE MUSCLE ONCE AS NEEDED FOR ALLERGIC REACTION 2 each 0  . levothyroxine (SYNTHROID) 50 MCG tablet Take one and a half tablets once daily every morning by mouth 90 tablet 3  . Multiple Vitamin (MULTIVITAMIN) tablet Take 1 tablet by mouth daily.      Marland Kitchen UNABLE TO FIND MASTECTOMY BRA AND PROTHESIS DX Z90.12 6 each 2  . UNABLE TO FIND 6 mastectomy prosthesis and bras. 6 each 0   No current facility-administered medications for this  visit.    Patient confirms/reports the following allergies:  Allergies  Allergen Reactions  . Sulfonamide Derivatives Itching  . Yellow Jacket Venom [Bee Venom] Rash    urticaria    No orders of the defined types were placed in this encounter.   AUTHORIZATION INFORMATION Primary Insurance: Water engineer,  ID #: [id number],  Group #: [id number] Pre-Cert / Auth required: No, not required per Sherrian Divers / Auth #:  Ref# Lisabeth Devoid 01/19/2020  SCHEDULE INFORMATION: Procedure has been scheduled as follows:  Date:03/03/2020, Time: 12:00 Location: [facility] with Dr. Gala Romney  This Gastroenterology Pre-Precedure Review Form is being routed to the following provider(s): Roseanne Kaufman, NP

## 2020-01-18 NOTE — Progress Notes (Signed)
Appropriate.

## 2020-01-25 ENCOUNTER — Ambulatory Visit: Payer: No Typology Code available for payment source | Admitting: Orthopaedic Surgery

## 2020-01-27 ENCOUNTER — Ambulatory Visit (INDEPENDENT_AMBULATORY_CARE_PROVIDER_SITE_OTHER): Payer: No Typology Code available for payment source | Admitting: Orthopaedic Surgery

## 2020-01-27 ENCOUNTER — Other Ambulatory Visit: Payer: Self-pay

## 2020-01-27 ENCOUNTER — Encounter: Payer: Self-pay | Admitting: Orthopaedic Surgery

## 2020-01-27 DIAGNOSIS — M25562 Pain in left knee: Secondary | ICD-10-CM | POA: Diagnosis not present

## 2020-01-27 DIAGNOSIS — G8929 Other chronic pain: Secondary | ICD-10-CM

## 2020-01-27 NOTE — Progress Notes (Addendum)
PROCEDURE NOTE:  The patient request injection, verbal consent was obtained.  The left knee was prepped appropriately after time out was performed.   Sterile technique was observed and anesthesia was provided by ethyl chloride and a 20-gauge needle was used to inject the knee area.  A 16-gauge needle was then used to aspirate the knee.  Color of fluid aspirated was 45  Total cc's aspirated was straw.    Injection of 1 cc of Depo-Medrol 40 mg with several cc's of plain xylocaine was then performed.  A band aid dressing was applied.  The patient was advised to apply ice later today and tomorrow to the injection sight as needed.  I am concerned about a meniscus tear.  I would like to get MRI of the left knee.  Return in two weeks.  Call if any problem.  Precautions discussed.   Electronically Signed Sanjuana Kava, MD 4/22/20218:44 AM   Addendum:  She has pain of the knee on the left.  She has effusion, crepitus and medial joint line pain.  She has positive medial McMurray.  I am concerned about a meniscus tear.  I would like to get a MRI of the left knee.  Electronically Signed Sanjuana Kava, MD 5/4/20211:35 PM

## 2020-01-27 NOTE — Addendum Note (Signed)
Addended by: Derek Mound A on: 01/27/2020 08:46 AM   Modules accepted: Orders

## 2020-01-28 ENCOUNTER — Encounter: Payer: Self-pay | Admitting: "Endocrinology

## 2020-01-28 ENCOUNTER — Ambulatory Visit (INDEPENDENT_AMBULATORY_CARE_PROVIDER_SITE_OTHER): Payer: No Typology Code available for payment source | Admitting: "Endocrinology

## 2020-01-28 VITALS — BP 123/73 | HR 56 | Ht 66.0 in | Wt 199.3 lb

## 2020-01-28 DIAGNOSIS — E1165 Type 2 diabetes mellitus with hyperglycemia: Secondary | ICD-10-CM | POA: Insufficient documentation

## 2020-01-28 DIAGNOSIS — E782 Mixed hyperlipidemia: Secondary | ICD-10-CM

## 2020-01-28 DIAGNOSIS — E890 Postprocedural hypothyroidism: Secondary | ICD-10-CM | POA: Diagnosis not present

## 2020-01-28 MED ORDER — LEVOTHYROXINE SODIUM 75 MCG PO TABS: ORAL_TABLET | ORAL | 2 refills | Status: DC

## 2020-01-28 NOTE — Progress Notes (Signed)
Endocrinology Consult Note                                         01/28/2020, 8:40 AM   Deanna Bush is a 59 y.o.-year-old female patient being seen in consultation for hypothyroidism referred by Fayrene Helper, MD.   Past Medical History:  Diagnosis Date  . Adenocarcinoma of breast ([place])    left   . Cellulitis of leg, left   . Diabetes mellitus without complication ([place])   . Family history of breast cancer 08/16/2011  . Hyperlipidemia   . Hypothyroidism   . MRSA (methicillin resistant staph aureus) culture positive 08/19/2011    Past Surgical History:  Procedure Laterality Date  . BREAST SURGERY Left    mastectomy  . CESAREAN SECTION     x2  . left mastectomy      Social History   Socioeconomic History  . Marital status: Married    Spouse name: Not on file  . Number of children: 4  . Years of education: Not on file  . Highest education level: Not on file  Occupational History  . Not on file  Tobacco Use  . Smoking status: Former Smoker    Packs/day: 0.50    Years: 18.00    Pack years: 9.00    Types: Cigarettes    Quit date: 07/01/2002    Years since quitting: 17.5  . Smokeless tobacco: Never Used  Substance and Sexual Activity  . Alcohol use: No  . Drug use: No  . Sexual activity: Not Currently  Other Topics Concern  . Not on file  Social History Narrative  . Not on file   Social Determinants of Health   Financial Resource Strain:   . Difficulty of Paying Living Expenses:   Food Insecurity:   . Worried About Charity fundraiser in the Last Year:   . Arboriculturist in the Last Year:   Transportation Needs:   . Film/video editor (Medical):   Marland Kitchen Lack of Transportation (Non-Medical):   Physical Activity:   . Days of Exercise per Week:   . Minutes of Exercise per Session:   Stress:   . Feeling of Stress :   Social Connections:   . Frequency of Communication with  Friends and Family:   . Frequency of Social Gatherings with Friends and Family:   . Attends Religious Services:   . Active Member of Clubs or Organizations:   . Attends Archivist Meetings:   Marland Kitchen Marital Status:     Family History  Problem Relation Age of Onset  . Hypertension Mother   . Diabetes Mother   . Hyperlipidemia Mother   . COPD Brother   . Cancer Sister        breast  . Cancer Sister        breast    Outpatient Encounter Medications as of 01/28/2020  Medication Sig  . aspirin  EC 81 MG tablet Take 81 mg by mouth daily.  Marland Kitchen atorvastatin (LIPITOR) 10 MG tablet TAKE 1 TABLET BY MOUTH DAILY  . EPINEPHrine 0.3 mg/0.3 mL IJ SOAJ injection INJECT 0.3MLS(1 SYRINGE) INTO THE MUSCLE ONCE AS NEEDED FOR ALLERGIC REACTION  . levothyroxine (SYNTHROID) 75 MCG tablet Take one and a half tablets once daily every morning by mouth  . Multiple Vitamin (MULTIVITAMIN) tablet Take 1 tablet by mouth daily.    Marland Kitchen UNABLE TO FIND MASTECTOMY BRA AND PROTHESIS DX Z90.12  . UNABLE TO FIND 6 mastectomy prosthesis and bras.  . [DISCONTINUED] levothyroxine (SYNTHROID) 50 MCG tablet Take one and a half tablets once daily every morning by mouth   No facility-administered encounter medications on file as of 01/28/2020.    ALLERGIES: Allergies  Allergen Reactions  . Sulfonamide Derivatives Itching  . Yellow Jacket Venom [Bee Venom] Rash    urticaria   VACCINATION STATUS: Immunization History  Administered Date(s) Administered  . Influenza Split 08/06/2012  . Influenza,inj,Quad PF,6+ Mos 07/22/2018, 06/28/2019  . Pneumococcal Conjugate-13 03/29/2015  . Tdap 03/12/2011  . Zoster Recombinat (Shingrix) 05/05/2018, 07/02/2018     HPI    Deanna Bush  is a patient with the above medical history.  Her history starts at approximate age of 31 when she had Graves' disease which required RAI thyroid ablation.  This required subsequent initiation of thyroid hormone replacement. she was given  various doses of levothyroxine over the years, currently on 50 micrograms. she reports compliance to this medication:  Taking it daily on empty stomach  with water, separated by >30 minutes before breakfast and other medications , and by at least 4 hours from   calcium, iron, PPIs, multivitamins .  I reviewed patient's  thyroid tests:  Lab Results  Component Value Date   TSH 30.55 (H) 01/03/2020   TSH 0.054 (L) 11/15/2019   TSH 5.03 (H) 04/22/2019   TSH 3.27 10/23/2018   TSH 0.34 (L) 05/05/2018   TSH 0.10 (L) 03/16/2018   TSH 1.22 10/11/2017   TSH 7.96 (H) 06/14/2017   TSH 2.68 11/29/2016   TSH 0.24 (L) 08/12/2016   FREET4 0.7 (L) 01/03/2020    -She did have trouble regulating her thyroid function tests.  She denies fatigue, palpitations, tremors, heat/cold intolerance. -She reports fluctuating body weight. -She denies dysphagia, shortness of breath, no voice change.  She underwent thyroid ultrasound recently which did not show significant goiter more any discrete nodules.  she has family history of  thyroid disorders in her mother.  No family history of thyroid cancer.  -She exercises regularly, non-smoker.  Her medical history also includes hyperlipidemia for which she is taking atorvastatin 10 mg p.o. nightly. -She denies history of diabetes, however review of her medical records show A1c of 6.5 recently which is consistent with type 2 diabetes.   ROS:  Constitutional: + Fluctuating body weight, no fatigue, no subjective hyperthermia, no subjective hypothermia Eyes: no blurry vision, no xerophthalmia ENT: no sore throat, no nodules palpated in throat, no dysphagia/odynophagia, no hoarseness Cardiovascular: no Chest Pain, no Shortness of Breath, no palpitations, no leg swelling Respiratory: no cough, no SOB Gastrointestinal: no Nausea/Vomiting/Diarhhea Musculoskeletal: no muscle/joint aches Skin: no rashes Neurological: no tremors, no numbness, no tingling, no  dizziness Psychiatric: no depression, no anxiety   Physical Exam: BP 123/73   Pulse (!) 56   Ht 5\' 6"  (1.676 m)   Wt 199 lb 4.8 oz (90.4 kg)   BMI 32.17 kg/m  Wt Readings  from Last 3 Encounters:  01/28/20 199 lb 4.8 oz (90.4 kg)  01/11/20 201 lb (91.2 kg)  09/13/19 190 lb (86.2 kg)    Constitutional:  Body mass index is 32.17 kg/m., not in acute distress, normal state of mind Eyes: PERRLA, EOMI, no exophthalmos ENT: moist mucous membranes, no thyromegaly, no cervical lymphadenopathy Cardiovascular: normal precordial activity, Regular Rate and Rhythm, no Murmur/Rubs/Gallops Respiratory:  adequate breathing efforts, no gross chest deformity, Clear to auscultation bilaterally Gastrointestinal: abdomen soft, Non -tender, No distension, Bowel Sounds present Musculoskeletal: no gross deformities, strength intact in all four extremities Skin: moist, warm, no rashes Neurological: no tremor with outstretched hands, Deep tendon reflexes normal in all four extremities.   CMP ( most recent) CMP     Component Value Date/Time   NA 142 11/15/2019 1102   K 4.5 11/15/2019 1102   CL 107 (H) 11/15/2019 1102   CO2 24 11/15/2019 1102   GLUCOSE 107 (H) 11/15/2019 1102   GLUCOSE 102 (H) 04/22/2019 0848   BUN 13 11/15/2019 1102   CREATININE 0.73 11/15/2019 1102   CREATININE 0.73 04/22/2019 0848   CALCIUM 9.7 11/15/2019 1102   PROT 7.2 11/15/2019 1102   ALBUMIN 4.2 11/15/2019 1102   AST 19 11/15/2019 1102   ALT 13 11/15/2019 1102   ALKPHOS 92 11/15/2019 1102   BILITOT 0.3 11/15/2019 1102   GFRNONAA 90 11/15/2019 1102   GFRNONAA 90 04/22/2019 0848   GFRAA 104 11/15/2019 1102   GFRAA 104 04/22/2019 0848     Diabetic Labs (most recent): Lab Results  Component Value Date   HGBA1C 6.5 (H) 11/15/2019   HGBA1C 6.4 (H) 04/22/2019   HGBA1C 6.4 (H) 10/23/2018     Lipid Panel ( most recent) Lipid Panel     Component Value Date/Time   CHOL 180 11/15/2019 1102   TRIG 54 11/15/2019 1102    HDL 65 11/15/2019 1102   CHOLHDL 2.7 10/23/2018 1148   VLDL 7 08/12/2016 0913   LDLCALC 104 (H) 11/15/2019 1102   LDLCALC 100 (H) 10/23/2018 1148   LABVLDL 11 11/15/2019 1102       Lab Results  Component Value Date   TSH 30.55 (H) 01/03/2020   TSH 0.054 (L) 11/15/2019   TSH 5.03 (H) 04/22/2019   TSH 3.27 10/23/2018   TSH 0.34 (L) 05/05/2018   TSH 0.10 (L) 03/16/2018   TSH 1.22 10/11/2017   TSH 7.96 (H) 06/14/2017   TSH 2.68 11/29/2016   TSH 0.24 (L) 08/12/2016   FREET4 0.7 (L) 01/03/2020       ASSESSMENT: 1. Hypothyroidism   2.  Type 2 diabetes  3.  Hyperlipidemia  PLAN:    Patient with long-standing hypothyroidism, on levothyroxine therapy. On physical exam , patient  does not  have  gross goiter, thyroid nodules, or neck compression symptoms. -Based on her recent thyroid function tests, she will benefit from a higher dose of levothyroxine.  I discussed and increase her levothyroxine to 75 mcg p.o. daily before breakfast.  Patient will likely require higher dose of levothyroxine on subsequent visits. - We discussed about correct intake of levothyroxine, at fasting, with water, separated by at least 30 minutes from breakfast, and separated by more than 4 hours from calcium, iron, multivitamins, acid reflux medications (PPIs). -Patient is made aware of the fact that thyroid hormone replacement is needed for life, dose to be adjusted by periodic monitoring of thyroid function tests. - Will check thyroid tests before next visit: TSH, free T4  -Her recent  thyroid ultrasound was reviewed, no goiter, no discrete nodules. -She has controlled type 2 diabetes, encouraged to continue exercise and diet.  She is also encouraged to continue her atorvastatin for dyslipidemia.   - Time spent with the patient: 60 minutes, of which >50% was spent in obtaining information about her symptoms, reviewing her previous labs, evaluations, and treatments, counseling her about her RAI induced  hypothyroidism, type 2 diabetes, hyperlipidemia, and developing a plan to confirm the diagnosis and long term treatment as necessary. Please refer to " Patient Self Inventory" in the Media  tab for reviewed elements of pertinent patient history.  [name] participated in the discussions, expressed understanding, and voiced agreement with the above plans.  All questions were answered to her satisfaction. she is encouraged to contact clinic should she have any questions or concerns prior to her return visit.  Return in about 9 weeks (around 03/31/2020) for Follow up with Pre-visit Labs.  Glade Lloyd, MD [facility] [address] [place], [place] [address] Phone: [phone]  Fax: [phone]   01/28/2020, 8:40 AM  This note was partially dictated with voice recognition software. Similar sounding words can be transcribed inadequately or may not  be corrected upon review.

## 2020-02-07 ENCOUNTER — Ambulatory Visit: Payer: No Typology Code available for payment source | Admitting: Family Medicine

## 2020-02-22 ENCOUNTER — Telehealth: Payer: Self-pay

## 2020-02-22 NOTE — Telephone Encounter (Signed)
Called pt and let her know I spoke with her insurance and she is covered for her appointment 03/07/20

## 2020-02-22 NOTE — Telephone Encounter (Signed)
Patient called stating that her insurance would have to do another authorization for her MRI. The one we have now runs out on 03/02/20 and her MRI is scheduled for 03/07/20. She would like for you to give her a call after you talk with her insurance.

## 2020-02-28 ENCOUNTER — Other Ambulatory Visit: Payer: Self-pay | Admitting: Family Medicine

## 2020-02-28 ENCOUNTER — Ambulatory Visit (INDEPENDENT_AMBULATORY_CARE_PROVIDER_SITE_OTHER): Payer: No Typology Code available for payment source | Admitting: Family Medicine

## 2020-02-28 ENCOUNTER — Encounter: Payer: Self-pay | Admitting: Family Medicine

## 2020-02-28 ENCOUNTER — Other Ambulatory Visit: Payer: Self-pay

## 2020-02-28 VITALS — BP 149/73 | HR 71 | Temp 97.7°F | Wt 198.0 lb

## 2020-02-28 DIAGNOSIS — N390 Urinary tract infection, site not specified: Secondary | ICD-10-CM | POA: Diagnosis not present

## 2020-02-28 LAB — POCT URINALYSIS DIPSTICK
Bilirubin, UA: NEGATIVE
Glucose, UA: NEGATIVE
Ketones, UA: NEGATIVE
Nitrite, UA: NEGATIVE
Protein, UA: NEGATIVE
Spec Grav, UA: >=1.03 — AB (ref 1.010–1.025)
Urobilinogen, UA: 0.2 E.U./dL
pH, UA: 6.5 (ref 5.0–8.0)

## 2020-02-28 MED ORDER — AMOXICILLIN 250 MG PO CAPS: 250.0000 mg | ORAL_CAPSULE | Freq: Two times a day (BID) | ORAL | 0 refills | Status: DC

## 2020-02-28 NOTE — Patient Instructions (Signed)
Amoxil -take twice a day Increase water-avoid caffeine

## 2020-02-28 NOTE — Progress Notes (Signed)
Acute Office Visit  Subjective:    Patient ID: Deanna Bush, female    DOB: 10/17/1960, 59 y.o.   MRN: [id number]  Chief Complaint  Patient presents with  . Urinary Tract Infection    pain,burning,frequency X 3 days    HPI Patient is in today for lower abdominal pain since Friday-intermittently. Pt states she has drank more water with improvement in symptoms. NO blood in her urine. No h/o kidney stones. No back pain. No fever. Pt with colonoscopy scheduled on Friday.  Pt states she recently changed synthroid dosage-followed by Dr. Dorris Fetch for hypothyroid. Pt scheduled for pelvic exam when dr Moshe Cipro returns. Menopause x 5 years ago. C-section -27 years ago.   Past Medical History:  Diagnosis Date  . Adenocarcinoma of breast ([place])    left   . Cellulitis of leg, left   . Diabetes mellitus without complication ([place])   . Family history of breast cancer 08/16/2011  . Hyperlipidemia   . Hypothyroidism   . MRSA (methicillin resistant staph aureus) culture positive 08/19/2011    Past Surgical History:  Procedure Laterality Date  . BREAST SURGERY Left    mastectomy  . CESAREAN SECTION     x2  . left mastectomy      Family History  Problem Relation Age of Onset  . Hypertension Mother   . Diabetes Mother   . Hyperlipidemia Mother   . COPD Brother   . Cancer Sister        breast  . Cancer Sister        breast    Social History   Socioeconomic History  . Marital status: Married    Spouse name: Not on file  . Number of children: 4  . Years of education: Not on file  . Highest education level: Not on file  Occupational History  . Not on file  Tobacco Use  . Smoking status: Former Smoker    Packs/day: 0.50    Years: 18.00    Pack years: 9.00    Types: Cigarettes    Quit date: 07/01/2002    Years since quitting: 17.6  . Smokeless tobacco: Never Used  Substance and Sexual Activity  . Alcohol use: No  . Drug use: No  . Sexual activity: Not Currently  Other Topics  Concern  . Not on file  Social History Narrative  . Not on file   Social Determinants of Health   Financial Resource Strain:   . Difficulty of Paying Living Expenses:   Food Insecurity:   . Worried About Charity fundraiser in the Last Year:   . Arboriculturist in the Last Year:   Transportation Needs:   . Film/video editor (Medical):   Marland Kitchen Lack of Transportation (Non-Medical):   Physical Activity:   . Days of Exercise per Week:   . Minutes of Exercise per Session:   Stress:   . Feeling of Stress :   Social Connections:   . Frequency of Communication with Friends and Family:   . Frequency of Social Gatherings with Friends and Family:   . Attends Religious Services:   . Active Member of Clubs or Organizations:   . Attends Archivist Meetings:   Marland Kitchen Marital Status:   Intimate Partner Violence:   . Fear of Current or Ex-Partner:   . Emotionally Abused:   Marland Kitchen Physically Abused:   . Sexually Abused:     Outpatient Medications Prior to Visit  Medication Sig Dispense  Refill  . atorvastatin (LIPITOR) 10 MG tablet TAKE 1 TABLET BY MOUTH DAILY (Patient taking differently: Take 10 mg by mouth daily. ) 90 tablet 1  . EPINEPHrine 0.3 mg/0.3 mL IJ SOAJ injection INJECT 0.3MLS(1 SYRINGE) INTO THE MUSCLE ONCE AS NEEDED FOR ALLERGIC REACTION (Patient taking differently: Inject 0.3 mg into the muscle as needed for anaphylaxis. ) 2 each 0  . fexofenadine (ALLEGRA) 180 MG tablet Take 180 mg by mouth daily.    Marland Kitchen levothyroxine (SYNTHROID) 75 MCG tablet Take one and a half tablets once daily every morning by mouth (Patient taking differently: Take 75 mcg by mouth daily before breakfast. ) 30 tablet 2  . Multiple Vitamin (MULTIVITAMIN) tablet Take 1 tablet by mouth daily.      Marland Kitchen UNABLE TO FIND MASTECTOMY BRA AND PROTHESIS DX Z90.12 6 each 2  . UNABLE TO FIND 6 mastectomy prosthesis and bras. 6 each 0   No facility-administered medications prior to visit.    Allergies  Allergen  Reactions  . Sulfonamide Derivatives Itching  . Yellow Jacket Venom [Bee Venom] Rash    urticaria    Review of Systems  Constitutional: Negative for fever.  Gastrointestinal: Positive for abdominal pain. Negative for diarrhea and nausea.  Endocrine: Negative for polyuria.  Genitourinary: Negative for dysuria, flank pain, frequency, hematuria and pelvic pain.       Objective:    Physical Exam Constitutional:      Appearance: Normal appearance.  Cardiovascular:     Rate and Rhythm: Normal rate and regular rhythm.     Pulses: Normal pulses.     Heart sounds: Normal heart sounds.  Pulmonary:     Effort: Pulmonary effort is normal.     Breath sounds: Normal breath sounds.  Abdominal:     General: There is no distension.     Tenderness: There is no abdominal tenderness. There is no right CVA tenderness, left CVA tenderness or guarding.  Neurological:     General: No focal deficit present.     Mental Status: She is alert and oriented to person, place, and time.  Psychiatric:        Mood and Affect: Mood normal.        Behavior: Behavior normal.     BP (!) 149/73 (BP Location: Right Arm, Patient Position: Sitting)   Pulse 71   Temp 97.7 F (36.5 C) (Temporal)   Wt 198 lb (89.8 kg)   SpO2 98%   BMI 31.96 kg/m  Wt Readings from Last 3 Encounters:  02/28/20 198 lb (89.8 kg)  01/28/20 199 lb 4.8 oz (90.4 kg)  01/11/20 201 lb (91.2 kg)    Health Maintenance Due  Topic Date Due  . COVID-19 Vaccine (1) Never done  . OPHTHALMOLOGY EXAM  04/16/2019  . URINE MICROALBUMIN  10/24/2019  . FOOT EXAM  10/30/2019  . COLONOSCOPY  11/08/2019     Lab Results  Component Value Date   TSH 30.55 (H) 01/03/2020   Lab Results  Component Value Date   WBC 4.1 04/22/2019   HGB 12.1 04/22/2019   HCT 38.0 04/22/2019   MCV 94.1 04/22/2019   PLT 278 04/22/2019   Lab Results  Component Value Date   NA 142 11/15/2019   K 4.5 11/15/2019   CO2 24 11/15/2019   GLUCOSE 107 (H)  11/15/2019   BUN 13 11/15/2019   CREATININE 0.73 11/15/2019   BILITOT 0.3 11/15/2019   ALKPHOS 92 11/15/2019   AST 19 11/15/2019   ALT 13  11/15/2019   PROT 7.2 11/15/2019   ALBUMIN 4.2 11/15/2019   CALCIUM 9.7 11/15/2019   Lab Results  Component Value Date   CHOL 180 11/15/2019   Lab Results  Component Value Date   HDL 65 11/15/2019   Lab Results  Component Value Date   LDLCALC 104 (H) 11/15/2019   Lab Results  Component Value Date   TRIG 54 11/15/2019   Lab Results  Component Value Date   CHOLHDL 2.7 10/23/2018   Lab Results  Component Value Date   HGBA1C 6.5 (H) 11/15/2019       Assessment & Plan:  1. Urinary tract infection without hematuria, site unspecified Amoxil-rx, increase water-will notify pt if change needed in antibiotics when culture completed-sulfa allergic,no PEN allergy - POCT Urinalysis Dipstick - Urine Culture   Ambria Mayfield Hannah Beat, MD

## 2020-02-28 NOTE — Progress Notes (Signed)
urin

## 2020-03-01 ENCOUNTER — Other Ambulatory Visit (HOSPITAL_COMMUNITY): Payer: No Typology Code available for payment source

## 2020-03-01 ENCOUNTER — Other Ambulatory Visit: Payer: Self-pay

## 2020-03-01 ENCOUNTER — Other Ambulatory Visit (HOSPITAL_COMMUNITY)
Admission: RE | Admit: 2020-03-01 | Discharge: 2020-03-01 | Disposition: A | Discharge status: Home / Self Care | Payer: No Typology Code available for payment source | Source: Ambulatory Visit | Attending: Internal Medicine | Admitting: Internal Medicine

## 2020-03-01 DIAGNOSIS — Z01812 Encounter for preprocedural laboratory examination: Secondary | ICD-10-CM | POA: Insufficient documentation

## 2020-03-01 DIAGNOSIS — Z20822 Contact with and (suspected) exposure to covid-19: Secondary | ICD-10-CM | POA: Insufficient documentation

## 2020-03-01 LAB — URINE CULTURE
MICRO NUMBER:: 10512430
SPECIMEN QUALITY:: ADEQUATE

## 2020-03-01 LAB — HOUSE ACCOUNT TRACKING

## 2020-03-02 LAB — SARS CORONAVIRUS 2 (TAT 6-24 HRS): SARS Coronavirus 2: NEGATIVE

## 2020-03-03 ENCOUNTER — Encounter (HOSPITAL_COMMUNITY)
Admission: RE | Disposition: A | Discharge status: Home / Self Care | Payer: Self-pay | Source: Home / Self Care | Attending: Internal Medicine

## 2020-03-03 ENCOUNTER — Encounter (HOSPITAL_COMMUNITY): Payer: Self-pay | Admitting: Internal Medicine

## 2020-03-03 ENCOUNTER — Other Ambulatory Visit: Payer: Self-pay

## 2020-03-03 ENCOUNTER — Encounter: Payer: Self-pay | Admitting: Internal Medicine

## 2020-03-03 ENCOUNTER — Ambulatory Visit (HOSPITAL_COMMUNITY)
Admission: RE | Admit: 2020-03-03 | Discharge: 2020-03-03 | Disposition: A | Discharge status: Home / Self Care | Payer: PRIVATE HEALTH INSURANCE | Attending: Internal Medicine | Admitting: Internal Medicine

## 2020-03-03 DIAGNOSIS — Z79899 Other long term (current) drug therapy: Secondary | ICD-10-CM | POA: Diagnosis not present

## 2020-03-03 DIAGNOSIS — Z8614 Personal history of Methicillin resistant Staphylococcus aureus infection: Secondary | ICD-10-CM | POA: Diagnosis not present

## 2020-03-03 DIAGNOSIS — Z91030 Bee allergy status: Secondary | ICD-10-CM | POA: Diagnosis not present

## 2020-03-03 DIAGNOSIS — Z8249 Family history of ischemic heart disease and other diseases of the circulatory system: Secondary | ICD-10-CM | POA: Insufficient documentation

## 2020-03-03 DIAGNOSIS — Z800 Family history of malignant neoplasm of digestive organs: Secondary | ICD-10-CM | POA: Insufficient documentation

## 2020-03-03 DIAGNOSIS — Z803 Family history of malignant neoplasm of breast: Secondary | ICD-10-CM | POA: Insufficient documentation

## 2020-03-03 DIAGNOSIS — Z882 Allergy status to sulfonamides status: Secondary | ICD-10-CM | POA: Diagnosis not present

## 2020-03-03 DIAGNOSIS — Z825 Family history of asthma and other chronic lower respiratory diseases: Secondary | ICD-10-CM | POA: Insufficient documentation

## 2020-03-03 DIAGNOSIS — Z1211 Encounter for screening for malignant neoplasm of colon: Secondary | ICD-10-CM | POA: Diagnosis not present

## 2020-03-03 DIAGNOSIS — Z833 Family history of diabetes mellitus: Secondary | ICD-10-CM | POA: Diagnosis not present

## 2020-03-03 DIAGNOSIS — Z853 Personal history of malignant neoplasm of breast: Secondary | ICD-10-CM | POA: Insufficient documentation

## 2020-03-03 DIAGNOSIS — E039 Hypothyroidism, unspecified: Secondary | ICD-10-CM | POA: Insufficient documentation

## 2020-03-03 DIAGNOSIS — E119 Type 2 diabetes mellitus without complications: Secondary | ICD-10-CM | POA: Diagnosis not present

## 2020-03-03 DIAGNOSIS — Z87891 Personal history of nicotine dependence: Secondary | ICD-10-CM | POA: Insufficient documentation

## 2020-03-03 DIAGNOSIS — E785 Hyperlipidemia, unspecified: Secondary | ICD-10-CM | POA: Diagnosis not present

## 2020-03-03 DIAGNOSIS — Z8349 Family history of other endocrine, nutritional and metabolic diseases: Secondary | ICD-10-CM | POA: Diagnosis not present

## 2020-03-03 DIAGNOSIS — K635 Polyp of colon: Secondary | ICD-10-CM | POA: Diagnosis not present

## 2020-03-03 HISTORY — PX: COLONOSCOPY: SHX5424

## 2020-03-03 HISTORY — PX: POLYPECTOMY: SHX5525

## 2020-03-03 SURGERY — COLONOSCOPY
Anesthesia: Moderate Sedation

## 2020-03-03 MED ORDER — STERILE WATER FOR IRRIGATION IR SOLN
Status: DC | PRN
  Administered 2020-03-03: 1.5 mL

## 2020-03-03 MED ORDER — MEPERIDINE HCL 100 MG/ML IJ SOLN
INTRAMUSCULAR | Status: DC | PRN
  Administered 2020-03-03: 25 mg via INTRAVENOUS
  Administered 2020-03-03: 15 mg via INTRAVENOUS

## 2020-03-03 MED ORDER — MIDAZOLAM HCL 5 MG/5ML IJ SOLN
INTRAMUSCULAR | Status: DC | PRN
  Administered 2020-03-03: 2 mg via INTRAVENOUS
  Administered 2020-03-03 (×3): 1 mg via INTRAVENOUS

## 2020-03-03 MED ORDER — ONDANSETRON HCL 4 MG/2ML IJ SOLN
INTRAMUSCULAR | Status: DC | PRN
  Administered 2020-03-03: 4 mg via INTRAVENOUS

## 2020-03-03 MED ORDER — SODIUM CHLORIDE 0.9 % IV SOLN: INTRAVENOUS | Status: DC

## 2020-03-03 MED ORDER — ONDANSETRON HCL 4 MG/2ML IJ SOLN
INTRAMUSCULAR | Status: AC
  Filled 2020-03-03: qty 2

## 2020-03-03 MED ORDER — MIDAZOLAM HCL 5 MG/5ML IJ SOLN
INTRAMUSCULAR | Status: AC
  Filled 2020-03-03: qty 10

## 2020-03-03 MED ORDER — MEPERIDINE HCL 50 MG/ML IJ SOLN
INTRAMUSCULAR | Status: AC
  Filled 2020-03-03: qty 1

## 2020-03-03 NOTE — H&P (Signed)
@LOGO @   Primary Care Physician:  Fayrene Helper, MD Primary Gastroenterologist:  Dr. Gala Romney  Pre-Procedure History & Physical: HPI:  Deanna Bush is a 59 y.o. female is here for a screening colonoscopy.  No bowel symptoms.  History of breast cancer.  History of maternal aunt with colon cancer diagnosed at age 86.  Negative colonoscopy 10 years ago.  Past Medical History:  Diagnosis Date  . 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 Cellulitis of leg, left   . Diabetes mellitus without complication ([place])   . Family history of breast cancer 08/16/2011  . Hyperlipidemia   . Hypothyroidism   . MRSA (methicillin resistant staph aureus) culture positive 08/19/2011    Past Surgical History:  Procedure Laterality Date  . BREAST SURGERY Left    mastectomy  . CESAREAN SECTION     x2  . left mastectomy      Prior to Admission medications   Medication Sig Start Date End Date Taking? Authorizing Provider  amoxicillin (AMOXIL) 250 MG capsule Take 1 capsule (250 mg total) by mouth 2 (two) times daily. 02/28/20  Yes Corum, Rex Kras, MD  atorvastatin (LIPITOR) 10 MG tablet TAKE 1 TABLET BY MOUTH DAILY Patient taking differently: Take 10 mg by mouth daily.  11/01/19  Yes Fayrene Helper, MD  fexofenadine (ALLEGRA) 180 MG tablet Take 180 mg by mouth daily.   Yes [provider]  levothyroxine (SYNTHROID) 75 MCG tablet Take one and a half tablets once daily every morning by mouth Patient taking differently: Take 75 mcg by mouth daily before breakfast.  01/28/20  Yes Nida, Marella Chimes, MD  Multiple Vitamin (MULTIVITAMIN) tablet Take 1 tablet by mouth daily.     Yes [provider]  EPINEPHrine 0.3 mg/0.3 mL IJ SOAJ injection INJECT 0.3MLS(1 SYRINGE) INTO THE MUSCLE ONCE AS NEEDED FOR ALLERGIC REACTION Patient taking differently: Inject 0.3 mg into the muscle as needed for anaphylaxis.  04/29/19   Fayrene Helper, MD  UNABLE TO FIND MASTECTOMY [place] AND PROTHESIS DX  [id number] 10/16/17   Fayrene Helper, MD  UNABLE TO FIND 6 mastectomy prosthesis and bras. 09/13/19   Fayrene Helper, MD    Allergies as of 01/19/2020 - Review Complete 01/18/2020  Allergen Reaction Noted  . Sulfonamide derivatives Itching   . Yellow jacket venom [bee venom] Rash 04/02/2015    Family History  Problem Relation Age of Onset  . Hypertension Mother   . Diabetes Mother   . Hyperlipidemia Mother   . COPD Brother   . Cancer Sister        breast  . Cancer Sister        breast    Social History   Socioeconomic History  . Marital status: Married    Spouse name: Not on file  . Number of children: 4  . Years of education: Not on file  . Highest education level: Not on file  Occupational History  . Not on file  Tobacco Use  . Smoking status: Former Smoker    Packs/day: 0.50    Years: 18.00    Pack years: 9.00    Types: Cigarettes    Quit date: 07/01/2002    Years since quitting: 17.6  . Smokeless tobacco: Never Used  Substance and Sexual Activity  . Alcohol use: No  . Drug use: No  . Sexual activity: Not Currently  Other Topics Concern  . Not on file  Social History Narrative  .  Not on file   Social Determinants of Health   Financial Resource Strain:   . Difficulty of Paying Living Expenses:   Food Insecurity:   . Worried About Charity fundraiser in the Last Year:   . Arboriculturist in the Last Year:   Transportation Needs:   . Film/video editor (Medical):   Marland Kitchen Lack of Transportation (Non-Medical):   Physical Activity:   . Days of Exercise per Week:   . Minutes of Exercise per Session:   Stress:   . Feeling of Stress :   Social Connections:   . Frequency of Communication with Friends and Family:   . Frequency of Social Gatherings with Friends and Family:   . Attends Religious Services:   . Active Member of Clubs or Organizations:   . Attends Archivist Meetings:   Marland Kitchen Marital Status:   Intimate Partner Violence:   . Fear of  Current or Ex-Partner:   . Emotionally Abused:   Marland Kitchen Physically Abused:   . Sexually Abused:     Review of Systems: See HPI, otherwise negative ROS  Physical Exam: BP 132/73   Pulse 64   Temp 98.3 F (36.8 C) (Oral)   Resp 14   Ht 5\' 6"  (1.676 m)   SpO2 100%   BMI 31.96 kg/m  General:   Alert,  Well-developed, well-nourished, pleasant and cooperative in NAD Lungs:  Clear throughout to auscultation.   No wheezes, crackles, or rhonchi. No acute distress. Heart:  Regular rate and rhythm; no murmurs, clicks, rubs,  or gallops. Abdomen:  Soft, nontender and nondistended. No masses, hepatosplenomegaly or hernias noted. Normal bowel sounds, without guarding, and without rebound.    Impression/Plan: Deanna Bush is now here to undergo a screening colonoscopy.  Risks, benefits, limitations, imponderables and alternatives regarding colonoscopy have been reviewed with the patient. Questions have been answered. All parties agreeable.     Notice:  This dictation was prepared with Dragon dictation along with smaller phrase technology. Any transcriptional errors that result from this process are unintentional and may not be corrected upon review.

## 2020-03-03 NOTE — Discharge Instructions (Signed)
Colonoscopy Discharge Instructions  Read the instructions outlined below and refer to this sheet in the next few weeks. These discharge instructions provide you with general information on caring for yourself after you leave the hospital. Your doctor may also give you specific instructions. While your treatment has been planned according to the most current medical practices available, unavoidable complications occasionally occur. If you have any problems or questions after discharge, call Dr. Gala Romney at [phone]. ACTIVITY  You may resume your regular activity, but move at a slower pace for the next 24 hours.   Take frequent rest periods for the next 24 hours.   Walking will help get rid of the air and reduce the bloated feeling in your belly (abdomen).   No driving for 24 hours (because of the medicine (anesthesia) used during the test).    Do not sign any important legal documents or operate any machinery for 24 hours (because of the anesthesia used during the test).  NUTRITION  Drink plenty of fluids.   You may resume your normal diet as instructed by your doctor.   Begin with a light meal and progress to your normal diet. Heavy or fried foods are harder to digest and may make you feel sick to your stomach (nauseated).   Avoid alcoholic beverages for 24 hours or as instructed.  MEDICATIONS  You may resume your normal medications unless your doctor tells you otherwise.  WHAT YOU CAN EXPECT TODAY  Some feelings of bloating in the abdomen.   Passage of more gas than usual.   Spotting of blood in your stool or on the toilet paper.  IF YOU HAD POLYPS REMOVED DURING THE COLONOSCOPY:  No aspirin products for 7 days or as instructed.   No alcohol for 7 days or as instructed.   Eat a soft diet for the next 24 hours.  FINDING OUT THE RESULTS OF YOUR TEST Not all test results are available during your visit. If your test results are not back during the visit, make an appointment  with your caregiver to find out the results. Do not assume everything is normal if you have not heard from your caregiver or the medical facility. It is important for you to follow up on all of your test results.  SEEK IMMEDIATE MEDICAL ATTENTION IF:  You have more than a spotting of blood in your stool.   Your belly is swollen (abdominal distention).   You are nauseated or vomiting.   You have a temperature over 101.   You have abdominal pain or discomfort that is severe or gets worse throughout the day.   Colon polyp information provided  Further recommendations to follow pending review of pathology report  At patient request I called Benita Gutter at [phone] and reviewed results   Colon Polyps  Polyps are tissue growths inside the body. Polyps can grow in many places, including the large intestine (colon). A polyp may be a round bump or a mushroom-shaped growth. You could have one polyp or several. Most colon polyps are noncancerous (benign). However, some colon polyps can become cancerous over time. Finding and removing the polyps early can help prevent this. What are the causes? The exact cause of colon polyps is not known. What increases the risk? You are more likely to develop this condition if you:  Have a family history of colon cancer or colon polyps.  Are older than 25 or older than 45 if you are African American.  Have inflammatory bowel disease, such  as ulcerative colitis or Crohn's disease.  Have certain hereditary conditions, such as: ? Familial adenomatous polyposis. ? Lynch syndrome. ? Turcot syndrome. ? Peutz-Jeghers syndrome.  Are overweight.  Smoke cigarettes.  Do not get enough exercise.  Drink too much alcohol.  Eat a diet that is high in fat and red meat and low in fiber.  Had childhood cancer that was treated with abdominal radiation. What are the signs or symptoms? Most polyps do not cause symptoms. If you have symptoms, they may  include:  Blood coming from your rectum when having a bowel movement.  Blood in your stool. The stool may look dark red or black.  Abdominal pain.  A change in bowel habits, such as constipation or diarrhea. How is this diagnosed? This condition is diagnosed with a colonoscopy. This is a procedure in which a lighted, flexible scope is inserted into the anus and then passed into the colon to examine the area. Polyps are sometimes found when a colonoscopy is done as part of routine cancer screening tests. How is this treated? Treatment for this condition involves removing any polyps that are found. Most polyps can be removed during a colonoscopy. Those polyps will then be tested for cancer. Additional treatment may be needed depending on the results of testing. Follow these instructions at home: Lifestyle  Maintain a healthy weight, or lose weight if recommended by your health care provider.  Exercise every day or as told by your health care provider.  Do not use any products that contain nicotine or tobacco, such as cigarettes and e-cigarettes. If you need help quitting, ask your health care provider.  If you drink alcohol, limit how much you have: ? 0-1 drink a day for women. ? 0-2 drinks a day for men.  Be aware of how much alcohol is in your drink. In the [place] one drink equals one 12 oz bottle of beer (355 mL), one 5 oz glass of wine (148 mL), or one 1 oz shot of hard liquor (44 mL). Eating and drinking   Eat foods that are high in fiber, such as fruits, vegetables, and whole grains.  Eat foods that are high in calcium and vitamin D, such as milk, cheese, yogurt, eggs, liver, fish, and broccoli.  Limit foods that are high in fat, such as fried foods and desserts.  Limit the amount of red meat and processed meat you eat, such as hot dogs, sausage, bacon, and lunch meats. General instructions  Keep all follow-up visits as told by your health care provider. This is  important. ? This includes having regularly scheduled colonoscopies. ? Talk to your health care provider about when you need a colonoscopy. Contact a health care provider if:  You have new or worsening bleeding during a bowel movement.  You have new or increased blood in your stool.  You have a change in bowel habits.  You lose weight for no known reason. Summary  Polyps are tissue growths inside the body. Polyps can grow in many places, including the colon.  Most colon polyps are noncancerous (benign), but some can become cancerous over time.  This condition is diagnosed with a colonoscopy.  Treatment for this condition involves removing any polyps that are found. Most polyps can be removed during a colonoscopy. This information is not intended to replace advice given to you by your health care provider. Make sure you discuss any questions you have with your health care provider. Document Revised: 01/08/2018 Document Reviewed: 01/08/2018 Elsevier Patient  Education  [employer].

## 2020-03-03 NOTE — Op Note (Signed)
[facility] Patient Name: Deanna Bush Procedure Date: 03/03/2020 11:04 AM MRN: [id number] Date of Birth: 1961/04/16 Attending MD: Norvel Richards , MD CSN: [id number] Age: 59 Admit Type: Outpatient Procedure:                Colonoscopy Indications:              Screening for colorectal malignant neoplasm Providers:                Norvel Richards, MD, Jeanann Lewandowsky. Gwenlyn Perking RN, RN,                            Aram Candela Referring MD:              Medicines:                Midazolam 5 mg IV, Meperidine 40 mg IV Complications:            No immediate complications. Estimated Blood Loss:     Estimated blood loss: none. Procedure:                Pre-Anesthesia Assessment:                           - Prior to the procedure, a History and Physical                            was performed, and patient medications and                            allergies were reviewed. [name] tolerance of                            previous anesthesia was also reviewed. The risks                            and benefits of the procedure and the sedation                            options and risks were discussed with the patient.                            All questions were answered, and informed consent                            was obtained. Prior Anticoagulants: The patient has                            taken no previous anticoagulant or antiplatelet                            agents. ASA Grade Assessment: II - A patient with                            mild systemic disease. After reviewing the risks  and benefits, the patient was deemed in                            satisfactory condition to undergo the procedure.                           After obtaining informed consent, the colonoscope                            was passed under direct vision. Throughout the                            procedure, the patient's blood pressure, pulse, and                             oxygen saturations were monitored continuously. The                            [id number] scope was introduced through                            the anus and advanced to the the cecum, identified                            by appendiceal orifice and ileocecal valve. The                            colonoscopy was performed without difficulty. The                            patient tolerated the procedure well. The quality                            of the bowel preparation was adequate. Scope In: 11:28:28 AM Scope Out: 11:42:02 AM Scope Withdrawal Time: 0 hours 9 minutes 11 seconds  Total Procedure Duration: 0 hours 13 minutes 34 seconds  Findings:      The perianal and digital rectal examinations were normal.      A 5 mm polyp was found in the recto-sigmoid colon. The polyp was       sessile. The polyp was removed with a cold snare. Resection and       retrieval were complete. Estimated blood loss was minimal.      The exam was otherwise without abnormality on direct and retroflexion       views. Impression:               - One 5 mm polyp at the recto-sigmoid colon,                            removed with a cold snare. Resected and retrieved.                           - The examination was otherwise normal on direct  and retroflexion views. Moderate Sedation:      Moderate (conscious) sedation was administered by the endoscopy nurse       and supervised by the endoscopist. The following parameters were       monitored: oxygen saturation, heart rate, blood pressure, respiratory       rate, EKG, adequacy of pulmonary ventilation, and response to care.       Total physician intraservice time was 21 minutes. Recommendation:           - Patient has a contact number available for                            emergencies. The signs and symptoms of potential                            delayed complications were discussed with the                             patient. Return to normal activities tomorrow.                            Written discharge instructions were provided to the                            patient.                           - Resume previous diet.                           - Repeat colonoscopy date to be determined after                            pending pathology results are reviewed for                            surveillance.                           - Return to GI office (date not yet determined). Procedure Code(s):        --- Professional ---                           [phone], Colonoscopy, flexible; with removal of                            tumor(s), polyp(s), or other lesion(s) by snare                            technique                           G0500, Moderate sedation services provided by the                            same physician or other qualified health care  professional performing a gastrointestinal                            endoscopic service that sedation supports,                            requiring the presence of an independent trained                            observer to assist in the monitoring of the                            patient's level of consciousness and physiological                            status; initial 15 minutes of intra-service time;                            patient age 2 years or older (additional time may                            be reported with [phone], as appropriate) Diagnosis Code(s):        --- Professional ---                           Z12.11, Encounter for screening for malignant                            neoplasm of colon                           K63.5, Polyp of colon CPT copyright 2019 American Medical Association. All rights reserved. The codes documented in this report are preliminary and upon coder review may  be revised to meet current compliance requirements. Cristopher Estimable. Hiilei Gerst, MD Norvel Richards, MD 03/03/2020 11:50:42  AM This report has been signed electronically. Number of Addenda: 0

## 2020-03-07 ENCOUNTER — Other Ambulatory Visit: Payer: Self-pay

## 2020-03-07 ENCOUNTER — Ambulatory Visit (HOSPITAL_COMMUNITY)
Admission: RE | Admit: 2020-03-07 | Discharge: 2020-03-07 | Disposition: A | Discharge status: Home / Self Care | Payer: PRIVATE HEALTH INSURANCE | Source: Ambulatory Visit | Attending: Orthopaedic Surgery | Admitting: Orthopaedic Surgery

## 2020-03-07 DIAGNOSIS — G8929 Other chronic pain: Secondary | ICD-10-CM | POA: Insufficient documentation

## 2020-03-07 DIAGNOSIS — M25562 Pain in left knee: Secondary | ICD-10-CM | POA: Insufficient documentation

## 2020-03-07 LAB — SURGICAL PATHOLOGY

## 2020-03-11 ENCOUNTER — Encounter: Payer: Self-pay | Admitting: Internal Medicine

## 2020-03-14 ENCOUNTER — Ambulatory Visit: Payer: No Typology Code available for payment source | Admitting: Orthopaedic Surgery

## 2020-03-14 ENCOUNTER — Encounter: Payer: Self-pay | Admitting: Orthopaedic Surgery

## 2020-03-14 ENCOUNTER — Other Ambulatory Visit: Payer: Self-pay

## 2020-03-14 VITALS — BP 135/71 | HR 58 | Ht 66.0 in | Wt 198.0 lb

## 2020-03-14 DIAGNOSIS — M25562 Pain in left knee: Secondary | ICD-10-CM | POA: Diagnosis not present

## 2020-03-14 DIAGNOSIS — G8929 Other chronic pain: Secondary | ICD-10-CM

## 2020-03-14 NOTE — Progress Notes (Signed)
Patient Deanna Bush, female DOB:August 05, 1961, 59 y.o. [id number]  Chief Complaint  Patient presents with  . Knee Pain    left  . Results    review MRI     HPI  Deanna Bush is a 59 y.o. female who has left knee pain and giving way and swelling.  She had MRI done which showed: IMPRESSION: 1. Degenerated and torn lateral meniscus. 2. Intact ligamentous structures and no acute bony findings. 3. Moderate tricompartmental degenerative changes. 4. Moderate to large joint effusion, moderate synovitis and small loose bodies.  I have explained the findings to her.  I have independently reviewed the MRI.  I have recommended arthroscopy to her. I have explained the procedure.  I will have Dr. Aline Brochure see her. She is agreeable.   Body mass index is 31.96 kg/m.  ROS  Review of Systems  All other systems reviewed and are negative.  The following is a summary of the past history medically, past history surgically, known current medicines, social history and family history.  This information is gathered electronically by the computer from prior information and documentation.  I review this each visit and have found including this information at this point in the chart is beneficial and informative.    Past Medical History:  Diagnosis Date  . Adenocarcinoma of breast ([place])    left   . Cellulitis of leg, left   . Diabetes mellitus without complication ([place])   . Family history of breast cancer 08/16/2011  . Hyperlipidemia   . Hypothyroidism   . MRSA (methicillin resistant staph aureus) culture positive 08/19/2011    Past Surgical History:  Procedure Laterality Date  . BREAST SURGERY Left    mastectomy  . CESAREAN SECTION     x2  . COLONOSCOPY N/A 03/03/2020   Procedure: COLONOSCOPY;  Surgeon: Daneil Dolin, MD;  Location: [facility];  Service: Endoscopy;  Laterality: N/A;  12:00  . left mastectomy    . POLYPECTOMY  03/03/2020   Procedure: POLYPECTOMY;  Surgeon: Daneil Dolin, MD;  Location: [facility];  Service: Endoscopy;;    Family History  Problem Relation Age of Onset  . Hypertension Mother   . Diabetes Mother   . Hyperlipidemia Mother   . COPD Brother   . Cancer Sister        breast  . Cancer Sister        breast    Social History Social History   Tobacco Use  . Smoking status: Former Smoker    Packs/day: 0.50    Years: 18.00    Pack years: 9.00    Types: Cigarettes    Quit date: 07/01/2002    Years since quitting: 17.7  . Smokeless tobacco: Never Used  Substance Use Topics  . Alcohol use: No  . Drug use: No    Allergies  Allergen Reactions  . Sulfonamide Derivatives Itching  . Yellow [place] Venom [Bee Venom] Rash    urticaria    Current Outpatient Medications  Medication Sig Dispense Refill  . atorvastatin (LIPITOR) 10 MG tablet TAKE 1 TABLET BY MOUTH DAILY (Patient taking differently: Take 10 mg by mouth daily. ) 90 tablet 1  . EPINEPHrine 0.3 mg/0.3 mL IJ SOAJ injection INJECT 0.3MLS(1 SYRINGE) INTO THE MUSCLE ONCE AS NEEDED FOR ALLERGIC REACTION (Patient taking differently: Inject 0.3 mg into the muscle as needed for anaphylaxis. ) 2 each 0  . fexofenadine (ALLEGRA) 180 MG tablet Take 180 mg by mouth daily.    Marland Kitchen  levothyroxine (SYNTHROID) 75 MCG tablet Take one and a half tablets once daily every morning by mouth (Patient taking differently: Take 75 mcg by mouth daily before breakfast. ) 30 tablet 2  . Multiple Vitamin (MULTIVITAMIN) tablet Take 1 tablet by mouth daily.      Marland Kitchen UNABLE TO FIND MASTECTOMY [place] AND PROTHESIS DX Z90.12 6 each 2  . UNABLE TO FIND 6 mastectomy prosthesis and bras. 6 each 0   No current facility-administered medications for this visit.     Physical Exam  Blood pressure 135/71, pulse (!) 58, height 5\' 6"  (1.676 m), weight 198 lb (89.8 kg).  Constitutional: overall normal hygiene, normal nutrition, well developed, normal grooming, normal body habitus. Assistive  device:none  Musculoskeletal: gait and station Limp left, muscle tone and strength are normal, no tremors or atrophy is present.  .  Neurological: coordination overall normal.  Deep tendon reflex/nerve stretch intact.  Sensation normal.  Cranial nerves II-XII intact.   Skin:   Normal overall no scars, lesions, ulcers or rashes. No psoriasis.  Psychiatric: Alert and oriented x 3.  Recent memory intact, remote memory unclear.  Normal mood and affect. Well groomed.  Good eye contact.  Cardiovascular: overall no swelling, no varicosities, no edema bilaterally, normal temperatures of the legs and arms, no clubbing, cyanosis and good capillary refill.  Lymphatic: palpation is normal.  Left knee has effusion, crepitus, pain laterally, ROM 0 to 105, positive lateral [place].  NV intact.  All other systems reviewed and are negative   The patient has been educated about the nature of the problem(s) and counseled on treatment options.  The patient appeared to understand what I have discussed and is in agreement with it.  Encounter Diagnosis  Name Primary?  . Chronic pain of left knee Yes    PLAN Call if any problems.  Precautions discussed.  Continue current medications.   Return to clinic to see Dr. Aline Brochure   Electronically Signed Sanjuana Kava, MD 6/8/20219:30 AM

## 2020-03-26 ENCOUNTER — Other Ambulatory Visit: Payer: Self-pay | Admitting: "Endocrinology

## 2020-03-27 NOTE — Telephone Encounter (Signed)
Please advise 

## 2020-03-28 ENCOUNTER — Other Ambulatory Visit: Payer: Self-pay

## 2020-03-28 ENCOUNTER — Ambulatory Visit (INDEPENDENT_AMBULATORY_CARE_PROVIDER_SITE_OTHER): Payer: No Typology Code available for payment source | Admitting: Orthopedic Surgery

## 2020-03-28 ENCOUNTER — Encounter: Payer: Self-pay | Admitting: Orthopedic Surgery

## 2020-03-28 VITALS — BP 146/80 | HR 53 | Ht 66.0 in | Wt 196.0 lb

## 2020-03-28 DIAGNOSIS — G8929 Other chronic pain: Secondary | ICD-10-CM | POA: Diagnosis not present

## 2020-03-28 DIAGNOSIS — M1712 Unilateral primary osteoarthritis, left knee: Secondary | ICD-10-CM | POA: Diagnosis not present

## 2020-03-28 DIAGNOSIS — M25562 Pain in left knee: Secondary | ICD-10-CM | POA: Diagnosis not present

## 2020-03-28 DIAGNOSIS — M23301 Other meniscus derangements, unspecified lateral meniscus, left knee: Secondary | ICD-10-CM | POA: Diagnosis not present

## 2020-03-28 DIAGNOSIS — M1710 Unilateral primary osteoarthritis, unspecified knee: Secondary | ICD-10-CM

## 2020-03-28 NOTE — Progress Notes (Signed)
Chief Complaint  Patient presents with   Knee Pain    L/aches at night some    59 year old female previously seen by Dr. Luna Glasgow sent to me to discuss possible surgery;  Her symptoms are swelling no pain occasional ache.  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    Cellulitis of leg, left    Diabetes mellitus without complication ([place])    Family history of breast cancer 08/16/2011   Hyperlipidemia    Hypothyroidism    MRSA (methicillin resistant staph aureus) culture positive 08/19/2011   Past Surgical History:  Procedure Laterality Date   BREAST SURGERY Left    mastectomy   CESAREAN SECTION     x2   COLONOSCOPY N/A 03/03/2020   Procedure: COLONOSCOPY;  Surgeon: Daneil Dolin, MD;  [facility];  Service: Endoscopy;  Laterality: N/A;  12:00   left mastectomy     POLYPECTOMY  03/03/2020   Procedure: POLYPECTOMY;  Surgeon: Daneil Dolin, MD;  Location: [facility];  Service: Endoscopy;;   Social History   Tobacco Use   Smoking status: Former Smoker    Packs/day: 0.50    Years: 18.00    Pack years: 9.00    Types: Cigarettes    Quit date: 07/01/2002    Years since quitting: 17.7   Smokeless tobacco: Never Used  Substance Use Topics   Alcohol use: No   Drug use: No   BP (!) 146/80    Pulse (!) 53    Ht 5\' 6"  (1.676 m)    Wt 196 lb (88.9 kg)    BMI 31.64 kg/m    I saw her MRI she does have a lateral meniscus tear she does have arthritis she does have synovitis and loose bodies  She is going to delay surgery until her function deteriorates or her pain increases otherwise she will take Advil  I will see her on an as needed basis  Chronic knee pain with meniscal tear  Independent image evaluation

## 2020-03-28 NOTE — Patient Instructions (Signed)
Ice in the evening and ice after exercise  Use Advil if it is aching   Let us know if it gets worse and you can have the surgery then

## 2020-03-29 LAB — T4, FREE: Free T4: 1.1 ng/dL (ref 0.8–1.8)

## 2020-03-29 LAB — TSH: TSH: 8.37 mIU/L — ABNORMAL HIGH (ref 0.40–4.50)

## 2020-04-04 ENCOUNTER — Other Ambulatory Visit: Payer: Self-pay

## 2020-04-04 ENCOUNTER — Encounter: Payer: Self-pay | Admitting: "Endocrinology

## 2020-04-04 ENCOUNTER — Ambulatory Visit (INDEPENDENT_AMBULATORY_CARE_PROVIDER_SITE_OTHER): Payer: No Typology Code available for payment source | Admitting: "Endocrinology

## 2020-04-04 VITALS — BP 124/67 | HR 54 | Ht 66.0 in | Wt 197.8 lb

## 2020-04-04 DIAGNOSIS — E890 Postprocedural hypothyroidism: Secondary | ICD-10-CM

## 2020-04-04 MED ORDER — LEVOTHYROXINE SODIUM 88 MCG PO TABS: 88.0000 ug | ORAL_TABLET | Freq: Every day | ORAL | 4 refills | Status: DC

## 2020-04-04 NOTE — Progress Notes (Signed)
04/04/2020, 9:44 AM  Endocrinology follow-up note   Deanna Bush is a 59 y.o.-year-old female patient being seen for follow-up after she was seen in consultation for hypothyroidism. PMD:  Fayrene Helper, MD.   Past Medical History:  Diagnosis Date  . 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 Cellulitis of leg, left   . Diabetes mellitus without complication ([place])   . Family history of breast cancer 08/16/2011  . Hyperlipidemia   . Hypothyroidism   . MRSA (methicillin resistant staph aureus) culture positive 08/19/2011    Past Surgical History:  Procedure Laterality Date  . BREAST SURGERY Left    mastectomy  . CESAREAN SECTION     x2  . COLONOSCOPY N/A 03/03/2020   Procedure: COLONOSCOPY;  Surgeon: Daneil Dolin, MD;  [facility];  Service: Endoscopy;  Laterality: N/A;  12:00  . left mastectomy    . POLYPECTOMY  03/03/2020   Procedure: POLYPECTOMY;  Surgeon: Daneil Dolin, MD;  [facility];  Service: Endoscopy;;    Social History   Socioeconomic History  . Marital status: Married    Spouse name: Not on file  . Number of children: 4  . Years of education: Not on file  . Highest education level: Not on file  Occupational History  . Not on file  Tobacco Use  . Smoking status: Former Smoker    Packs/day: 0.50    Years: 18.00    Pack years: 9.00    Types: Cigarettes    Quit date: 07/01/2002    Years since quitting: 17.7  . Smokeless tobacco: Never Used  Substance and Sexual Activity  . Alcohol use: No  . Drug use: No  . Sexual activity: Not Currently  Other Topics Concern  . Not on file  Social History Narrative  . Not on file   Social Determinants of Health   Financial Resource Strain:   . Difficulty of Paying Living Expenses:   Food Insecurity:   . Worried About Charity fundraiser in the Last Year:   . Arboriculturist in the Last Year:    Transportation Needs:   . Film/video editor (Medical):   Marland Kitchen Lack of Transportation (Non-Medical):   Physical Activity:   . Days of Exercise per Week:   . Minutes of Exercise per Session:   Stress:   . Feeling of Stress :   Social Connections:   . Frequency of Communication with Friends and Family:   . Frequency of Social Gatherings with Friends and Family:   . Attends Religious Services:   . Active Member of Clubs or Organizations:   . Attends Archivist Meetings:   Marland Kitchen Marital Status:     Family History  Problem Relation Age of Onset  . Hypertension Mother   . Diabetes Mother   . Hyperlipidemia Mother   . COPD Brother   . Cancer Sister        breast  . Cancer Sister  breast    Outpatient Encounter Medications as of 04/04/2020  Medication Sig  . atorvastatin (LIPITOR) 10 MG tablet TAKE 1 TABLET BY MOUTH DAILY (Patient taking differently: Take 10 mg by mouth daily. )  . EPINEPHrine 0.3 mg/0.3 mL IJ SOAJ injection INJECT 0.3MLS(1 SYRINGE) INTO THE MUSCLE ONCE AS NEEDED FOR ALLERGIC REACTION (Patient taking differently: Inject 0.3 mg into the muscle as needed for anaphylaxis. )  . fexofenadine (ALLEGRA) 180 MG tablet Take 180 mg by mouth daily.  Marland Kitchen levothyroxine (SYNTHROID) 88 MCG tablet Take 1 tablet (88 mcg total) by mouth daily before breakfast.  . Multiple Vitamin (MULTIVITAMIN) tablet Take 1 tablet by mouth daily.    Marland Kitchen UNABLE TO FIND MASTECTOMY BRA AND PROTHESIS DX Z90.12  . UNABLE TO FIND 6 mastectomy prosthesis and bras.  . [DISCONTINUED] levothyroxine (SYNTHROID) 75 MCG tablet Take 1 tablet (75 mcg total) by mouth daily before breakfast.   No facility-administered encounter medications on file as of 04/04/2020.    ALLERGIES: Allergies  Allergen Reactions  . Sulfonamide Derivatives Itching  . Yellow [place]et Venom [Bee Venom] Rash    urticaria   VACCINATION STATUS: Immunization History  Administered Date(s) Administered  . Influenza Split  08/06/2012  . Influenza,inj,Quad PF,6+ Mos 07/22/2018, 06/28/2019  . Pneumococcal Conjugate-13 03/29/2015  . Tdap 03/12/2011  . Zoster Recombinat (Shingrix) 05/05/2018, 07/02/2018     HPI    Deanna Bush  is a patient with the above medical history.  Her history starts at approximate age of 30 when she had Graves' disease which required RAI thyroid ablation.  This required subsequent initiation of thyroid hormone replacement.  She is currently on levothyroxine 75 mcg p.o. daily before breakfast.  She has no new complaints today.  Her previsit thyroid function tests are still consistent with under replacement.   -She did have trouble regulating her thyroid function tests.  She denies fatigue, palpitations, tremors, heat/cold intolerance. -She reports fluctuating body weight. -She denies dysphagia, shortness of breath, no voice change.  She underwent thyroid ultrasound recently which did not show significant goiter , nor any discrete thyroid nodules.    she has family history of  thyroid disorders in her mother.  No family history of thyroid cancer.  -She exercises regularly, non-smoker.  Her medical history also includes hyperlipidemia for which she is taking atorvastatin 10 mg p.o. nightly. -She denies history of diabetes, however review of her medical records show A1c of 6.5 recently which is consistent with type 2 diabetes.   ROS:  Constitutional: + Fluctuating body weight, no fatigue, no subjective hyperthermia, no subjective hypothermia Eyes: no blurry vision, no xerophthalmia ENT: no sore throat, no nodules palpated in throat, no dysphagia/odynophagia, no hoarseness Cardiovascular: no Chest Pain, no Shortness of Breath, no palpitations, no leg swelling Respiratory: no cough, no SOB Gastrointestinal: no Nausea/Vomiting/Diarhhea Musculoskeletal: no muscle/joint aches Skin: no rashes Neurological: no tremors, no numbness, no tingling, no dizziness Psychiatric: no depression, no  anxiety   Physical Exam: BP 124/67   Pulse (!) 54   Ht 5\' 6"  (1.676 m)   Wt 197 lb 12.8 oz (89.7 kg)   BMI 31.93 kg/m  Wt Readings from Last 3 Encounters:  04/04/20 197 lb 12.8 oz (89.7 kg)  03/28/20 196 lb (88.9 kg)  03/14/20 198 lb (89.8 kg)    Constitutional:  Body mass index is 31.93 kg/m., not in acute distress, normal state of mind Eyes: PERRLA, EOMI, no exophthalmos ENT: moist mucous membranes, no thyromegaly, no cervical lymphadenopathy Cardiovascular: normal  precordial activity, Regular Rate and Rhythm, no Murmur/Rubs/Gallops Respiratory:  adequate breathing efforts, no gross chest deformity, Clear to auscultation bilaterally Gastrointestinal: abdomen soft, Non -tender, No distension, Bowel Sounds present Musculoskeletal: no gross deformities, strength intact in all four extremities Skin: moist, warm, no rashes Neurological: no tremor with outstretched hands, Deep tendon reflexes normal in all four extremities.   CMP ( most recent) CMP     Component Value Date/Time   NA 142 11/15/2019 1102   K 4.5 11/15/2019 1102   CL 107 (H) 11/15/2019 1102   CO2 24 11/15/2019 1102   GLUCOSE 107 (H) 11/15/2019 1102   GLUCOSE 102 (H) 04/22/2019 0848   BUN 13 11/15/2019 1102   CREATININE 0.73 11/15/2019 1102   CREATININE 0.73 04/22/2019 0848   CALCIUM 9.7 11/15/2019 1102   PROT 7.2 11/15/2019 1102   ALBUMIN 4.2 11/15/2019 1102   AST 19 11/15/2019 1102   ALT 13 11/15/2019 1102   ALKPHOS 92 11/15/2019 1102   BILITOT 0.3 11/15/2019 1102   GFRNONAA 90 11/15/2019 1102   GFRNONAA 90 04/22/2019 0848   GFRAA 104 11/15/2019 1102   GFRAA 104 04/22/2019 0848     Diabetic Labs (most recent): Lab Results  Component Value Date   HGBA1C 6.5 (H) 11/15/2019   HGBA1C 6.4 (H) 04/22/2019   HGBA1C 6.4 (H) 10/23/2018     Lipid Panel ( most recent) Lipid Panel     Component Value Date/Time   CHOL 180 11/15/2019 1102   TRIG 54 11/15/2019 1102   HDL 65 11/15/2019 1102   CHOLHDL  2.7 10/23/2018 1148   VLDL 7 08/12/2016 0913   LDLCALC 104 (H) 11/15/2019 1102   LDLCALC 100 (H) 10/23/2018 1148   LABVLDL 11 11/15/2019 1102       Lab Results  Component Value Date   TSH 8.37 (H) 03/28/2020   TSH 30.55 (H) 01/03/2020   TSH 0.054 (L) 11/15/2019   TSH 5.03 (H) 04/22/2019   TSH 3.27 10/23/2018   TSH 0.34 (L) 05/05/2018   TSH 0.10 (L) 03/16/2018   TSH 1.22 10/11/2017   TSH 7.96 (H) 06/14/2017   TSH 2.68 11/29/2016   FREET4 1.1 03/28/2020   FREET4 0.7 (L) 01/03/2020       ASSESSMENT: 1. Hypothyroidism   2.  Type 2 diabetes  3.  Hyperlipidemia  PLAN:  -Her previsit thyroid function tests are consistent with under replacement, however significantly better than previous measurements.  I discussed and increased her levothyroxine to 88 mcg daily before breakfast.  She accepted these adjustment reluctantly.   Patient will likely require higher dose of levothyroxine on subsequent visits.   - We discussed about the correct intake of her thyroid hormone, on empty stomach at fasting, with water, separated by at least 30 minutes from breakfast and other medications,  and separated by more than 4 hours from calcium, iron, multivitamins, acid reflux medications (PPIs). -Patient is made aware of the fact that thyroid hormone replacement is needed for life, dose to be adjusted by periodic monitoring of thyroid function tests.   -Her recent thyroid ultrasound was reviewed, no goiter, no discrete nodules. -She has controlled type 2 diabetes, encouraged to continue exercise and diet.  She is also encouraged to continue her atorvastatin for dyslipidemia.  She is advised to maintain close follow-up with her PMD Dr. Tula Nakayama.     - Time spent on this patient care encounter:  20 minutes of which 50% was spent in  counseling and the rest reviewing  her current and  previous labs / studies and medications  doses and developing a plan for long term care. [name]   participated in the discussions, expressed understanding, and voiced agreement with the above plans.  All questions were answered to her satisfaction. she is encouraged to contact clinic should she have any questions or concerns prior to her return visit.   Return in about 5 months (around 09/04/2020) for F/U with Pre-visit Labs.  Glade Lloyd, MD [facility] [address] [place], [place] [address] Phone: [phone]  Fax: [phone]   04/04/2020, 9:44 AM  This note was partially dictated with voice recognition software. Similar sounding words can be transcribed inadequately or may not  be corrected upon review.

## 2020-05-10 ENCOUNTER — Other Ambulatory Visit: Payer: Self-pay

## 2020-05-10 MED ORDER — ATORVASTATIN CALCIUM 10 MG PO TABS: ORAL_TABLET | ORAL | 1 refills | Status: DC

## 2020-05-16 ENCOUNTER — Telehealth: Payer: Self-pay

## 2020-05-16 NOTE — Telephone Encounter (Signed)
Changed pt's pharmacy per her request.

## 2020-06-28 ENCOUNTER — Encounter: Payer: Self-pay | Admitting: Family Medicine

## 2020-06-28 ENCOUNTER — Other Ambulatory Visit: Payer: Self-pay

## 2020-06-28 ENCOUNTER — Ambulatory Visit (INDEPENDENT_AMBULATORY_CARE_PROVIDER_SITE_OTHER): Payer: No Typology Code available for payment source | Admitting: Family Medicine

## 2020-06-28 VITALS — BP 138/67 | HR 65 | Resp 16 | Ht 66.0 in | Wt 198.0 lb

## 2020-06-28 DIAGNOSIS — N3001 Acute cystitis with hematuria: Secondary | ICD-10-CM

## 2020-06-28 DIAGNOSIS — Z23 Encounter for immunization: Secondary | ICD-10-CM

## 2020-06-28 DIAGNOSIS — E782 Mixed hyperlipidemia: Secondary | ICD-10-CM

## 2020-06-28 DIAGNOSIS — E038 Other specified hypothyroidism: Secondary | ICD-10-CM

## 2020-06-28 DIAGNOSIS — C50912 Malignant neoplasm of unspecified site of left female breast: Secondary | ICD-10-CM | POA: Diagnosis not present

## 2020-06-28 DIAGNOSIS — N390 Urinary tract infection, site not specified: Secondary | ICD-10-CM

## 2020-06-28 DIAGNOSIS — E669 Obesity, unspecified: Secondary | ICD-10-CM

## 2020-06-28 DIAGNOSIS — R7303 Prediabetes: Secondary | ICD-10-CM | POA: Diagnosis not present

## 2020-06-28 DIAGNOSIS — E559 Vitamin D deficiency, unspecified: Secondary | ICD-10-CM

## 2020-06-28 DIAGNOSIS — E890 Postprocedural hypothyroidism: Secondary | ICD-10-CM

## 2020-06-28 LAB — POCT GLYCOSYLATED HEMOGLOBIN (HGB A1C): Hemoglobin A1C: 6.4 % — AB (ref 4.0–5.6)

## 2020-06-28 LAB — POCT URINALYSIS DIP (CLINITEK)
Bilirubin, UA: NEGATIVE
Glucose, UA: NEGATIVE mg/dL
Ketones, POC UA: NEGATIVE mg/dL
Nitrite, UA: NEGATIVE
POC PROTEIN,UA: NEGATIVE
Spec Grav, UA: 1.025 (ref 1.010–1.025)
Urobilinogen, UA: 0.2 E.U./dL
pH, UA: 6.5 (ref 5.0–8.0)

## 2020-06-28 MED ORDER — UNABLE TO FIND: 0 refills | Status: AC

## 2020-06-28 NOTE — Assessment & Plan Note (Signed)
Patient educated about the importance of limiting  Carbohydrate intake , the need to commit to daily physical activity for a minimum of 30 minutes , and to commit weight loss. The fact that changes in all these areas will reduce or eliminate all together the development of diabetes is stressed.   Diabetic Labs Latest Ref Rng & Units 06/28/2020 11/15/2019 04/22/2019 10/23/2018 03/16/2018  HbA1c 4.0 - 5.6 % 6.4(A) 6.5(H) 6.4(H) 6.4(H) 6.3(H)  Microalbumin mg/dL - - - 0.2 -  Micro/Creat Ratio <30 mcg/mg creat - - - - -  Chol 100 - 199 mg/dL - 180 - 185 180  HDL >39 mg/dL - 65 - 69 63  Calc LDL 0 - 99 mg/dL - 104(H) - 100(H) 103(H)  Triglycerides 0 - 149 mg/dL - 54 - 74 59  Creatinine 0.57 - 1.00 mg/dL - 0.73 0.73 0.76 0.65   BP/Weight 06/28/2020 04/04/2020 03/28/2020 03/14/2020 03/03/2020 02/28/2020 0/60/0459  Systolic BP 977 414 239 532 023 343 568  Diastolic BP 67 67 80 71 69 73 73  Wt. (Lbs) 198 197.8 196 198 - 198 199.3  BMI 31.96 31.93 31.64 31.96 31.96 31.96 32.17   Foot/eye exam completion dates Latest Ref Rng & Units 10/29/2018 06/17/2017  Eye Exam No Retinopathy - -  Foot Form Completion - Done Done

## 2020-06-28 NOTE — Patient Instructions (Addendum)
Physical with pap with mD mid January, call if you need me sooner  Flu vaccine today.  Script for 1 prosthesis and 6 bras will be sent  to [facility]   Urine being checked for infection in office  GlycohB in office today is 6.4 which makes you prediabetic, you are not a diabetic, keep working on this  It is important that you exercise regularly at least 30 minutes 5 times a week. If you develop chest pain, have severe difficulty breathing, or feel very tired, stop exercising immediately and seek medical attention  Think about what you will eat, plan ahead. Choose " clean, green, fresh or frozen" over canned, processed or packaged foods which are more sugary, salty and fatty. 70 to 75% of food eaten should be vegetables and fruit. Three meals at set times with snacks allowed between meals, but they must be fruit or vegetables. Aim to eat over a 12 hour period , example 7 am to 7 pm, and STOP after  your last meal of the day. Drink water,generally about 64 ounces per day, no other drink is as healthy. Fruit juice is best enjoyed in a healthy way, by EATING the fruit. Fasting lipid, cmp and eGFr, CBC and vit d  In next 1 weeks

## 2020-06-28 NOTE — Assessment & Plan Note (Signed)
Script for bras and prosthesis

## 2020-06-28 NOTE — Assessment & Plan Note (Signed)
Hyperlipidemia:Low fat diet discussed and encouraged.   Lipid Panel  Lab Results  Component Value Date   CHOL 180 11/15/2019   HDL 65 11/15/2019   LDLCALC 104 (H) 11/15/2019   TRIG 54 11/15/2019   CHOLHDL 2.7 10/23/2018  Updated lab needed .

## 2020-06-28 NOTE — Assessment & Plan Note (Signed)
Followed by Endo 

## 2020-06-28 NOTE — Progress Notes (Signed)
Deanna Bush     MRN: [id number]      DOB: Feb 07, 1961   HPI Deanna Bush is here for follow up and re-evaluation of chronic medical conditions, medication management and review of any available recent lab and radiology data.  Preventive health is updated, specifically  Cancer screening and Immunization.   Questions or concerns regarding consultations or procedures which the PT has had in the interim are  addressed. The PT denies any adverse reactions to current medications since the last visit.  5 day h/o dark malodorous urine, denies fevr, chills or flank pain  ROS Denies recent fever or chills. Denies sinus pressure, nasal congestion, ear pain or sore throat. Denies chest congestion, productive cough or wheezing. Denies chest pains, palpitations and leg swelling Denies abdominal pain, nausea, vomiting,diarrhea or constipation.    Denies joint pain, swelling and limitation in mobility. Denies headaches, seizures, numbness, or tingling. Denies depression, anxiety or insomnia. Denies skin break down or rash.   PE  BP 138/67   Pulse 65   Resp 16   Ht 5\' 6"  (1.676 m)   Wt 198 lb (89.8 kg)   SpO2 96%   BMI 31.96 kg/m   Patient alert and oriented and in no cardiopulmonary distress.  HEENT: No facial asymmetry, EOMI,     Neck supple .  Chest: Clear to auscultation bilaterally.  CVS: S1, S2 no murmurs, no S3.Regular rate.  ABD: Soft non tender.   Ext: No edema  MS: Adequate ROM spine, shoulders, hips and knees.  Skin: Intact, no ulcerations or rash noted.  Psych: Good eye contact, normal affect. Memory intact not anxious or depressed appearing.  CNS: CN 2-12 intact, power,  normal throughout.no focal deficits noted.   Assessment & Plan  Urinary tract infection without hematuria Abnormal UA, hold antibiotic pending c/s  Mixed hyperlipidemia Hyperlipidemia:Low fat diet discussed and encouraged.   Lipid Panel  Lab Results  Component Value Date   CHOL 180  11/15/2019   HDL 65 11/15/2019   LDLCALC 104 (H) 11/15/2019   TRIG 54 11/15/2019   CHOLHDL 2.7 10/23/2018  Updated lab needed .      Malignant neoplasm of female breast [facility]) Script for bras and prosthesis  Obesity (BMI 30.0-34.9)  Patient re-educated about  the importance of commitment to a  minimum of 150 minutes of exercise per week as able.  The importance of healthy food choices with portion control discussed, as well as eating regularly and within a 12 hour window most days. The need to choose "clean , green" food 50 to 75% of the time is discussed, as well as to make water the primary drink and set a goal of 64 ounces water daily.    Weight /BMI 06/28/2020 04/04/2020 03/28/2020  WEIGHT 198 lb 197 lb 12.8 oz 196 lb  HEIGHT 5\' 6"  5\' 6"  5\' 6"   BMI 31.96 kg/m2 31.93 kg/m2 31.64 kg/m2      Hypothyroidism following radioiodine therapy Followed by Endo  Prediabetes Patient educated about the importance of limiting  Carbohydrate intake , the need to commit to daily physical activity for a minimum of 30 minutes , and to commit weight loss. The fact that changes in all these areas will reduce or eliminate all together the development of diabetes is stressed.   Diabetic Labs Latest Ref Rng & Units 06/28/2020 11/15/2019 04/22/2019 10/23/2018 03/16/2018  HbA1c 4.0 - 5.6 % 6.4(A) 6.5(H) 6.4(H) 6.4(H) 6.3(H)  Microalbumin mg/dL - - - 0.2 -  Micro/Creat  Ratio <30 mcg/mg creat - - - - -  Chol 100 - 199 mg/dL - 180 - 185 180  HDL >39 mg/dL - 65 - 69 63  Calc LDL 0 - 99 mg/dL - 104(H) - 100(H) 103(H)  Triglycerides 0 - 149 mg/dL - 54 - 74 59  Creatinine 0.57 - 1.00 mg/dL - 0.73 0.73 0.76 0.65   BP/Weight 06/28/2020 04/04/2020 03/28/2020 03/14/2020 03/03/2020 02/28/2020 0/17/5102  Systolic BP 585 277 824 235 361 443 154  Diastolic BP 67 67 80 71 69 73 73  Wt. (Lbs) 198 197.8 196 198 - 198 199.3  BMI 31.96 31.93 31.64 31.96 31.96 31.96 32.17   Foot/eye exam completion dates Latest Ref Rng &  Units 10/29/2018 06/17/2017  Eye Exam No Retinopathy - -  Foot Form Completion - Done Done

## 2020-06-28 NOTE — Assessment & Plan Note (Signed)
Abnormal UA, hold antibiotic pending c/s

## 2020-06-28 NOTE — Assessment & Plan Note (Signed)
  Patient re-educated about  the importance of commitment to a  minimum of 150 minutes of exercise per week as able.  The importance of healthy food choices with portion control discussed, as well as eating regularly and within a 12 hour window most days. The need to choose "clean , green" food 50 to 75% of the time is discussed, as well as to make water the primary drink and set a goal of 64 ounces water daily.    Weight /BMI 06/28/2020 04/04/2020 03/28/2020  WEIGHT 198 lb 197 lb 12.8 oz 196 lb  HEIGHT 5\' 6"  5\' 6"  5\' 6"   BMI 31.96 kg/m2 31.93 kg/m2 31.64 kg/m2

## 2020-06-29 ENCOUNTER — Other Ambulatory Visit (HOSPITAL_COMMUNITY)
Admission: RE | Admit: 2020-06-29 | Discharge: 2020-06-29 | Disposition: A | Discharge status: Home / Self Care | Payer: PRIVATE HEALTH INSURANCE | Source: Other Acute Inpatient Hospital | Attending: Family Medicine | Admitting: Family Medicine

## 2020-06-29 DIAGNOSIS — N3001 Acute cystitis with hematuria: Secondary | ICD-10-CM | POA: Diagnosis not present

## 2020-07-02 ENCOUNTER — Other Ambulatory Visit: Payer: Self-pay | Admitting: Family Medicine

## 2020-07-02 LAB — URINE CULTURE: Culture: >=100000 — AB

## 2020-07-02 MED ORDER — AMPICILLIN 500 MG PO CAPS: 500.0000 mg | ORAL_CAPSULE | Freq: Three times a day (TID) | ORAL | 0 refills | Status: DC

## 2020-08-11 ENCOUNTER — Other Ambulatory Visit (HOSPITAL_COMMUNITY): Payer: Self-pay | Admitting: Family Medicine

## 2020-08-11 DIAGNOSIS — Z1231 Encounter for screening mammogram for malignant neoplasm of breast: Secondary | ICD-10-CM

## 2020-08-25 ENCOUNTER — Other Ambulatory Visit: Payer: Self-pay

## 2020-08-25 ENCOUNTER — Ambulatory Visit
Admission: EM | Admit: 2020-08-25 | Discharge: 2020-08-25 | Disposition: A | Discharge status: Home / Self Care | Payer: No Typology Code available for payment source | Attending: Emergency Medicine | Admitting: Emergency Medicine

## 2020-08-25 ENCOUNTER — Emergency Department (HOSPITAL_COMMUNITY)
Admission: EM | Admit: 2020-08-25 | Discharge: 2020-08-25 | Disposition: A | Discharge status: Home / Self Care | Payer: No Typology Code available for payment source | Attending: Emergency Medicine | Admitting: Emergency Medicine

## 2020-08-25 ENCOUNTER — Encounter (HOSPITAL_COMMUNITY): Payer: Self-pay

## 2020-08-25 DIAGNOSIS — R319 Hematuria, unspecified: Secondary | ICD-10-CM

## 2020-08-25 DIAGNOSIS — E782 Mixed hyperlipidemia: Secondary | ICD-10-CM | POA: Diagnosis not present

## 2020-08-25 DIAGNOSIS — E039 Hypothyroidism, unspecified: Secondary | ICD-10-CM | POA: Diagnosis not present

## 2020-08-25 DIAGNOSIS — Z87891 Personal history of nicotine dependence: Secondary | ICD-10-CM | POA: Diagnosis not present

## 2020-08-25 DIAGNOSIS — Z79899 Other long term (current) drug therapy: Secondary | ICD-10-CM | POA: Diagnosis not present

## 2020-08-25 DIAGNOSIS — E1169 Type 2 diabetes mellitus with other specified complication: Secondary | ICD-10-CM | POA: Diagnosis not present

## 2020-08-25 LAB — BASIC METABOLIC PANEL
Anion gap: 8 (ref 5–15)
BUN: 15 mg/dL (ref 8–23)
CO2: 25 mmol/L (ref 22–32)
Calcium: 9.4 mg/dL (ref 8.9–10.3)
Chloride: 104 mmol/L (ref 98–111)
Creatinine, Ser: 0.79 mg/dL (ref 0.44–1.00)
GFR, Estimated: >60 mL/min (ref >60)
Glucose, Bld: 88 mg/dL (ref 70–99)
Potassium: 3.6 mmol/L (ref 3.5–5.1)
Sodium: 137 mmol/L (ref 135–145)

## 2020-08-25 LAB — URINALYSIS, ROUTINE W REFLEX MICROSCOPIC
Bilirubin Urine: NEGATIVE
Glucose, UA: NEGATIVE mg/dL
Ketones, ur: NEGATIVE mg/dL
Leukocytes,Ua: NEGATIVE
Nitrite: NEGATIVE
Protein, ur: 100 mg/dL — AB
Specific Gravity, Urine: 1.002 — ABNORMAL LOW (ref 1.005–1.030)
pH: 7 (ref 5.0–8.0)

## 2020-08-25 LAB — POCT URINALYSIS DIP (MANUAL ENTRY)
Bilirubin, UA: NEGATIVE
Glucose, UA: NEGATIVE mg/dL
Ketones, POC UA: NEGATIVE mg/dL
Nitrite, UA: NEGATIVE
Protein Ur, POC: 100 mg/dL — AB
Spec Grav, UA: 1.015 (ref 1.010–1.025)
Urobilinogen, UA: 0.2 E.U./dL
pH, UA: 5.5 (ref 5.0–8.0)

## 2020-08-25 MED ORDER — CEPHALEXIN 500 MG PO CAPS: 500.0000 mg | ORAL_CAPSULE | Freq: Two times a day (BID) | ORAL | 0 refills | Status: AC

## 2020-08-25 NOTE — ED Provider Notes (Signed)
[facility] EMERGENCY DEPARTMENT Provider Note   CSN: [id number] Arrival date & time: 08/25/20  1916     History Chief Complaint  Patient presents with  . Hematuria    Deanna Bush is a 59 y.o. female.  HPI   59 year old female with a history of left breast cancer, diabetes, hyperlipidemia, MRSA, who presents the emergency department today for evaluation of hematuria.  States that she was at work today when she felt a pressure in her suprapubic area that felt like she was getting a UTI, she later had some blood in her urine so went to urgent care.  Urgent care noted to have gross hematuria sent here for further evaluation.  Patient denies any dysuria, frequency, urgency.  She denies any flank pain.  She denies any persistent suprapubic or abdominal pain.  Denies any recent fevers.  Past Medical History:  Diagnosis Date  . 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 Cellulitis of leg, left   . Diabetes mellitus without complication ([place])   . Family history of breast cancer 08/16/2011  . Hyperlipidemia   . Hypothyroidism   . MRSA (methicillin resistant staph aureus) culture positive 08/19/2011    Patient Active Problem List   Diagnosis Date Noted  . Urinary tract infection without hematuria 02/28/2020  . Hypothyroidism following radioiodine therapy 01/28/2020  . Urinary urgency 12/04/2018  . Prediabetes 03/29/2015  . Osteoarthritis of left knee 08/26/2012  . Vitamin D deficiency 08/04/2010  . Obesity (BMI 30.0-34.9) 07/20/2008  . Malignant neoplasm of female breast ([place]) 02/04/2008  . Hypothyroidism 02/04/2008  . Mixed hyperlipidemia 02/04/2008    Past Surgical History:  Procedure Laterality Date  . BREAST SURGERY Left    mastectomy  . CESAREAN SECTION     x2  . COLONOSCOPY N/A 03/03/2020   Procedure: COLONOSCOPY;  Surgeon: Daneil Dolin, MD;  [facility];  Service: Endoscopy;  Laterality: N/A;  12:00  . left mastectomy    . POLYPECTOMY  03/03/2020    Procedure: POLYPECTOMY;  Surgeon: Daneil Dolin, MD;  Location: [facility];  Service: Endoscopy;;     OB History    Gravida  2   Para  2   Term  2   Preterm      AB      Living  2     SAB      TAB      Ectopic      Multiple      Live Births              Family History  Problem Relation Age of Onset  . Hypertension Mother   . Diabetes Mother   . Hyperlipidemia Mother   . COPD Brother   . Cancer Sister        breast  . Cancer Sister        breast    Social History   Tobacco Use  . Smoking status: Former Smoker    Packs/day: 0.50    Years: 18.00    Pack years: 9.00    Types: Cigarettes    Quit date: 07/01/2002    Years since quitting: 18.1  . Smokeless tobacco: Never Used  Substance Use Topics  . Alcohol use: No  . Drug use: No    Home Medications Prior to Admission medications   Medication Sig Start Date End Date Taking? Authorizing Provider  atorvastatin (LIPITOR) 10 MG tablet TAKE 1 TABLET BY MOUTH DAILY 05/10/20  Fayrene Helper, MD  cephALEXin (KEFLEX) 500 MG capsule Take 1 capsule (500 mg total) by mouth 2 (two) times daily for 7 days. 08/25/20 09/01/20  Deserai Cansler S, PA-C  EPINEPHrine 0.3 mg/0.3 mL IJ SOAJ injection INJECT 0.3MLS(1 SYRINGE) INTO THE MUSCLE ONCE AS NEEDED FOR ALLERGIC REACTION Patient taking differently: Inject 0.3 mg into the muscle as needed for anaphylaxis.  04/29/19   Fayrene Helper, MD  fexofenadine (ALLEGRA) 180 MG tablet Take 180 mg by mouth daily.    [provider]  levothyroxine (SYNTHROID) 88 MCG tablet Take 1 tablet (88 mcg total) by mouth daily before breakfast. 04/04/20   Nida, Marella Chimes, MD  Multiple Vitamin (MULTIVITAMIN) tablet Take 1 tablet by mouth daily.      [provider]  UNABLE TO FIND MASTECTOMY [place] DX O17.51 10/16/17   Fayrene Helper, MD  UNABLE TO FIND 6 mastectomy prosthesis and bras. 06/28/20   Fayrene Helper, MD    Allergies     Sulfonamide derivatives and Yellow jacket venom [bee venom]  Review of Systems   Review of Systems  Constitutional: Negative for fever.  HENT: Negative for ear pain and sore throat.   Eyes: Negative for visual disturbance.  Respiratory: Negative for cough and shortness of breath.   Cardiovascular: Negative for chest pain.  Gastrointestinal: Negative for abdominal pain, constipation, diarrhea, nausea and vomiting.  Genitourinary: Positive for hematuria and pelvic pain (resolved). Negative for dysuria, flank pain, frequency and urgency.  Musculoskeletal: Negative for back pain.  Skin: Negative for rash.  Neurological: Negative for headaches.  All other systems reviewed and are negative.   Physical Exam Updated Vital Signs BP (!) 166/79 (BP Location: Right Arm)   Pulse (!) 52   Temp 97.9 F (36.6 C) (Oral)   Resp 18   Ht 5\' 6"  (1.676 m)   Wt 89.4 kg   SpO2 100%   BMI 31.80 kg/m   Physical Exam Vitals and nursing note reviewed.  Constitutional:      General: She is not in acute distress.    Appearance: She is well-developed.  HENT:     Head: Normocephalic and atraumatic.  Eyes:     Conjunctiva/sclera: Conjunctivae normal.  Cardiovascular:     Rate and Rhythm: Normal rate and regular rhythm.     Heart sounds: Normal heart sounds. No murmur heard.   Pulmonary:     Effort: Pulmonary effort is normal. No respiratory distress.     Breath sounds: Normal breath sounds. No wheezing, rhonchi or rales.  Abdominal:     General: Bowel sounds are normal.     Palpations: Abdomen is soft.     Tenderness: There is no abdominal tenderness. There is no right CVA tenderness, left CVA tenderness, guarding or rebound.  Musculoskeletal:     Cervical back: Neck supple.  Skin:    General: Skin is warm and dry.  Neurological:     Mental Status: She is alert.     ED Results / Procedures / Treatments   Labs (all labs ordered are listed, but only abnormal results are displayed) Labs  Reviewed  URINALYSIS, ROUTINE W REFLEX MICROSCOPIC - Abnormal; Notable for the following components:      Result Value   Color, Urine AMBER (*)    Specific Gravity, Urine 1.002 (*)    Hgb urine dipstick MODERATE (*)    Protein, ur 100 (*)    Bacteria, UA RARE (*)    All other components within  normal limits  URINE CULTURE  BASIC METABOLIC PANEL    EKG None  Radiology No results found.  Procedures Procedures (including critical care time)  Medications Ordered in ED Medications - No data to display  ED Course  I have reviewed the triage vital signs and the nursing notes.  Pertinent labs & imaging results that were available during my care of the patient were reviewed by me and considered in my medical decision making (see chart for details).    MDM Rules/Calculators/A&P                          59 year old female presenting the emergency department today for evaluation of painless hematuria.  Denies any urinary symptoms or flank pain.  Afebrile.  Vital signs are reassuring with the exception of some hypertension.  Will get UA, urine culture and BMP.  BMP with normal renal function UA with hematuria, no leuks or nitrites. UTI is not clear cut but will cover her with abx given her hematuria and suprapubic pain. Culture sent.  Discussed that there are many causes of hematuria and that if this persists she will need to follow-up with urology as she may need further investigation of the symptoms.  Advised on specific return precautions.  She voiced understanding the plan reasons to return.  Questions answered.  Patient able for discharge.  Final Clinical Impression(s) / ED Diagnoses Final diagnoses:  Hematuria, unspecified type    Rx / DC Orders ED Discharge Orders         Ordered    cephALEXin (KEFLEX) 500 MG capsule  2 times daily        08/25/20 2224           Rodney Booze, PA-C 08/25/20 2226    Fredia Sorrow, MD 09/12/20 1455

## 2020-08-25 NOTE — ED Notes (Signed)
ED Provider at bedside. 

## 2020-08-25 NOTE — ED Triage Notes (Signed)
Pt presents with c/o dark urine some lower abdominal pain and blood in urine that began today

## 2020-08-25 NOTE — ED Triage Notes (Signed)
Patient is being discharged from the Urgent Care and sent to the Emergency Department via private vehicle  . Per K Avegno , patient is in need of higher level of care, pts urine containing bright red blood . Patient is aware and verbalizes understanding of plan of care. There were no vitals filed for this visit.

## 2020-08-25 NOTE — Discharge Instructions (Signed)
Your urinalysis showed some blood today.  We are sending this for culture to see if it grows any bacteria.  Rec starting on an antibiotic to help treat a potential infection however there are many causes to hematuria and if this does not resolve after course of antibiotics then you need to follow-up with a urologist.  You were given a referral to urology.  I would suggest that you call them over the next few days to schedule an appointment for follow-up within the next 1 to 2 weeks.  Please return to the emergency department for new or worsening symptoms in the meantime.

## 2020-08-25 NOTE — ED Triage Notes (Signed)
Pt was sent here from urgent care for blood in urine. Pt says she did have pressure in lower abd earlier today "feeling like she might have a UTI", but this feeling has subsided. Pt denies urinary symptoms at this time. Pt denies pain.

## 2020-08-27 LAB — URINE CULTURE: Culture: NO GROWTH

## 2020-08-28 ENCOUNTER — Encounter: Payer: Self-pay | Admitting: Internal Medicine

## 2020-08-28 ENCOUNTER — Other Ambulatory Visit: Payer: Self-pay

## 2020-08-28 ENCOUNTER — Ambulatory Visit (INDEPENDENT_AMBULATORY_CARE_PROVIDER_SITE_OTHER): Payer: No Typology Code available for payment source | Admitting: Internal Medicine

## 2020-08-28 VITALS — BP 148/74 | HR 94 | Temp 97.8°F | Ht 66.0 in | Wt 195.0 lb

## 2020-08-28 DIAGNOSIS — R319 Hematuria, unspecified: Secondary | ICD-10-CM | POA: Diagnosis not present

## 2020-08-28 DIAGNOSIS — Z09 Encounter for follow-up examination after completed treatment for conditions other than malignant neoplasm: Secondary | ICD-10-CM

## 2020-08-28 DIAGNOSIS — N3001 Acute cystitis with hematuria: Secondary | ICD-10-CM

## 2020-08-28 NOTE — Patient Instructions (Signed)
Please continue to drink lots of water, at least 2.5 l of water.  Please continue to take medications as prescribed.

## 2020-08-28 NOTE — Progress Notes (Signed)
Established Patient Office Visit  Subjective:  Patient ID: Deanna Bush, female    DOB: 1961/04/15  Age: 59 y.o. MRN: [id number]  CC:  Chief Complaint  Patient presents with  . Hematuria    since Friday, went to Urgent Care and was sent to ER, is currently on abt    HPI Deanna Bush is a 59 year old female with PMH of hypothyroidism, OA of left knee, breast ca., HLD, and obesity presents after an ER visit for hematuria.  She c/o suprapubic pain and hematuria for 2 days, for which she went to urgent care. She had frank hematuria and was referred to ER. She was given Cephalexin after UA and urine culture. UA showed rare bacteria and urine culture is negative, but she states that her hematuria is better now since starting Cephalexin. She denies dysuria, urgency, frequency, hesitancy, fever, chills or flank pain. She denies any nausea or vomiting. She denies any h/o nephrolithiasis.  ER visit summary and labs were reviewed and discussed with the patient in detail.   Past Medical History:  Diagnosis Date  . 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 Cellulitis of leg, left   . Diabetes mellitus without complication ([place])   . Family history of breast cancer 08/16/2011  . Hyperlipidemia   . Hypothyroidism   . MRSA (methicillin resistant staph aureus) culture positive 08/19/2011    Past Surgical History:  Procedure Laterality Date  . BREAST SURGERY Left    mastectomy  . CESAREAN SECTION     x2  . COLONOSCOPY N/A 03/03/2020   Procedure: COLONOSCOPY;  Surgeon: Daneil Dolin, MD;  [facility];  Service: Endoscopy;  Laterality: N/A;  12:00  . left mastectomy    . POLYPECTOMY  03/03/2020   Procedure: POLYPECTOMY;  Surgeon: Daneil Dolin, MD;  [facility];  Service: Endoscopy;;    Family History  Problem Relation Age of Onset  . Hypertension Mother   . Diabetes Mother   . Hyperlipidemia Mother   . COPD Brother   . Cancer Sister        breast  .  Cancer Sister        breast    Social History   Socioeconomic History  . Marital status: Married    Spouse name: Not on file  . Number of children: 4  . Years of education: Not on file  . Highest education level: Not on file  Occupational History  . Not on file  Tobacco Use  . Smoking status: Former Smoker    Packs/day: 0.50    Years: 18.00    Pack years: 9.00    Types: Cigarettes    Quit date: 07/01/2002    Years since quitting: 18.1  . Smokeless tobacco: Never Used  Substance and Sexual Activity  . Alcohol use: No  . Drug use: No  . Sexual activity: Not Currently  Other Topics Concern  . Not on file  Social History Narrative  . Not on file   Social Determinants of Health   Financial Resource Strain:   . Difficulty of Paying Living Expenses: Not on file  Food Insecurity:   . Worried About Charity fundraiser in the Last Year: Not on file  . Ran Out of Food in the Last Year: Not on file  Transportation Needs:   . Lack of Transportation (Medical): Not on file  . Lack of Transportation (Non-Medical): Not on file  Physical Activity:   .  Days of Exercise per Week: Not on file  . Minutes of Exercise per Session: Not on file  Stress:   . Feeling of Stress : Not on file  Social Connections:   . Frequency of Communication with Friends and Family: Not on file  . Frequency of Social Gatherings with Friends and Family: Not on file  . Attends Religious Services: Not on file  . Active Member of Clubs or Organizations: Not on file  . Attends Archivist Meetings: Not on file  . Marital Status: Not on file  Intimate Partner Violence:   . Fear of Current or Ex-Partner: Not on file  . Emotionally Abused: Not on file  . Physically Abused: Not on file  . Sexually Abused: Not on file    Outpatient Medications Prior to Visit  Medication Sig Dispense Refill  . atorvastatin (LIPITOR) 10 MG tablet TAKE 1 TABLET BY MOUTH DAILY 90 tablet 1  . cephALEXin (KEFLEX) 500 MG  capsule Take 1 capsule (500 mg total) by mouth 2 (two) times daily for 7 days. 14 capsule 0  . EPINEPHrine 0.3 mg/0.3 mL IJ SOAJ injection INJECT 0.3MLS(1 SYRINGE) INTO THE MUSCLE ONCE AS NEEDED FOR ALLERGIC REACTION (Patient taking differently: Inject 0.3 mg into the muscle as needed for anaphylaxis. ) 2 each 0  . fexofenadine (ALLEGRA) 180 MG tablet Take 180 mg by mouth daily.    Marland Kitchen levothyroxine (SYNTHROID) 88 MCG tablet Take 1 tablet (88 mcg total) by mouth daily before breakfast. 30 tablet 4  . Multiple Vitamin (MULTIVITAMIN) tablet Take 1 tablet by mouth daily.      Marland Kitchen UNABLE TO FIND MASTECTOMY BRA AND PROTHESIS DX Z90.12 6 each 2  . UNABLE TO FIND 6 mastectomy prosthesis and bras. 6 each 0   No facility-administered medications prior to visit.    Allergies  Allergen Reactions  . Sulfonamide Derivatives Itching  . Yellow Jacket Venom [Bee Venom] Rash    urticaria    ROS Review of Systems  Constitutional: Negative for chills and fever.  HENT: Negative for congestion, sinus pressure, sinus pain and sore throat.   Eyes: Negative for pain and discharge.  Respiratory: Negative for cough and shortness of breath.   Cardiovascular: Negative for chest pain and palpitations.  Gastrointestinal: Negative for abdominal pain, constipation, diarrhea, nausea and vomiting.  Endocrine: Negative for polydipsia and polyuria.  Genitourinary: Positive for hematuria. Negative for difficulty urinating, dysuria, flank pain, frequency, urgency, vaginal discharge and vaginal pain.  Musculoskeletal: Negative for neck pain and neck stiffness.  Skin: Negative for rash.  Neurological: Negative for dizziness and weakness.  Psychiatric/Behavioral: Negative for agitation and behavioral problems.      Objective:    Physical Exam Vitals reviewed.  Constitutional:      General: She is not in acute distress.    Appearance: She is not diaphoretic.  HENT:     Head: Normocephalic and atraumatic.     Nose:  Nose normal. No congestion.     Mouth/Throat:     Mouth: Mucous membranes are moist.     Pharynx: No posterior oropharyngeal erythema.  Eyes:     General: No scleral icterus.    Extraocular Movements: Extraocular movements intact.     Pupils: Pupils are equal, round, and reactive to light.  Cardiovascular:     Rate and Rhythm: Normal rate and regular rhythm.     Heart sounds: Normal heart sounds. No murmur heard.   Pulmonary:     Breath sounds: Normal breath sounds.  No wheezing or rales.  Abdominal:     Palpations: Abdomen is soft.     Tenderness: There is no abdominal tenderness. There is no right CVA tenderness, left CVA tenderness, guarding or rebound.  Musculoskeletal:     Cervical back: Neck supple. No tenderness.     Right lower leg: No edema.     Left lower leg: No edema.  Skin:    General: Skin is warm.     Findings: No rash.  Neurological:     General: No focal deficit present.     Mental Status: She is alert and oriented to person, place, and time.  Psychiatric:        Mood and Affect: Mood normal.        Behavior: Behavior normal.     BP (!) 148/74 (BP Location: Right Arm, Patient Position: Sitting, Cuff Size: Large)   Pulse 94   Temp 97.8 F (36.6 C) (Temporal)   Ht 5\' 6"  (1.676 m)   Wt 195 lb (88.5 kg)   SpO2 99%   BMI 31.47 kg/m  Wt Readings from Last 3 Encounters:  08/28/20 195 lb (88.5 kg)  08/25/20 197 lb (89.4 kg)  06/28/20 198 lb (89.8 kg)     Health Maintenance Due  Topic Date Due  . URINE MICROALBUMIN  10/24/2019  . PAP SMEAR-Modifier  10/14/2020    There are no preventive care reminders to display for this patient.  Lab Results  Component Value Date   TSH 8.37 (H) 03/28/2020   Lab Results  Component Value Date   WBC 4.1 04/22/2019   HGB 12.1 04/22/2019   HCT 38.0 04/22/2019   MCV 94.1 04/22/2019   PLT 278 04/22/2019   Lab Results  Component Value Date   NA 137 08/25/2020   K 3.6 08/25/2020   CO2 25 08/25/2020   GLUCOSE 88  08/25/2020   BUN 15 08/25/2020   CREATININE 0.79 08/25/2020   BILITOT 0.3 11/15/2019   ALKPHOS 92 11/15/2019   AST 19 11/15/2019   ALT 13 11/15/2019   PROT 7.2 11/15/2019   ALBUMIN 4.2 11/15/2019   CALCIUM 9.4 08/25/2020   ANIONGAP 8 08/25/2020   Lab Results  Component Value Date   CHOL 180 11/15/2019   Lab Results  Component Value Date   HDL 65 11/15/2019   Lab Results  Component Value Date   LDLCALC 104 (H) 11/15/2019   Lab Results  Component Value Date   TRIG 54 11/15/2019   Lab Results  Component Value Date   CHOLHDL 2.7 10/23/2018   Lab Results  Component Value Date   HGBA1C 6.4 (A) 06/28/2020      Assessment & Plan:   Problem List Items Addressed This Visit    None    Visit Diagnoses    Encounter for examination following treatment at hospital    -  Primary Reviewed ER visit summary and labs from the visit Discussed lab results including CMP, UA and urine culture with the patient Discussed medications with the patient    Hematuria, unspecified type UA reviewed, showed large blood and Hb Likely passed urinary stone S. Cr. And BUN wnl       Acute cystitis with hematuria Suprapubic pain with hematuria, resolved now On Cephalexin, continue for now Advised to increase fluid intake to at least 2.5 l in a day      No orders of the defined types were placed in this encounter.   Follow-up: Return if symptoms worsen or fail  to improve.    Lindell Spar, MD

## 2020-08-29 ENCOUNTER — Other Ambulatory Visit: Payer: Self-pay | Admitting: "Endocrinology

## 2020-09-04 ENCOUNTER — Other Ambulatory Visit: Payer: Self-pay

## 2020-09-04 DIAGNOSIS — E890 Postprocedural hypothyroidism: Secondary | ICD-10-CM

## 2020-09-04 DIAGNOSIS — E782 Mixed hyperlipidemia: Secondary | ICD-10-CM

## 2020-09-05 LAB — TSH: TSH: 1.06 u[IU]/mL (ref 0.450–4.500)

## 2020-09-05 LAB — T4, FREE: Free T4: 1.3 ng/dL (ref 0.82–1.77)

## 2020-09-06 ENCOUNTER — Encounter: Payer: Self-pay | Admitting: "Endocrinology

## 2020-09-06 ENCOUNTER — Other Ambulatory Visit: Payer: Self-pay

## 2020-09-06 ENCOUNTER — Ambulatory Visit (INDEPENDENT_AMBULATORY_CARE_PROVIDER_SITE_OTHER): Payer: No Typology Code available for payment source | Admitting: "Endocrinology

## 2020-09-06 VITALS — BP 122/68 | HR 72 | Ht 66.0 in | Wt 195.5 lb

## 2020-09-06 DIAGNOSIS — E1165 Type 2 diabetes mellitus with hyperglycemia: Secondary | ICD-10-CM | POA: Diagnosis not present

## 2020-09-06 DIAGNOSIS — E782 Mixed hyperlipidemia: Secondary | ICD-10-CM | POA: Diagnosis not present

## 2020-09-06 DIAGNOSIS — E890 Postprocedural hypothyroidism: Secondary | ICD-10-CM

## 2020-09-06 MED ORDER — LEVOTHYROXINE SODIUM 88 MCG PO TABS
ORAL_TABLET | ORAL | 3 refills | Status: DC
Start: 2020-09-06 — End: 2020-11-28

## 2020-09-06 NOTE — Progress Notes (Signed)
09/06/2020, 8:33 AM  Endocrinology follow-up note   Deanna Bush is a 59 y.o.-year-old female patient being seen for follow-up after she was seen in consultation for hypothyroidism. PMD:  Fayrene Helper, MD.   Past Medical History:  Diagnosis Date  . Adenocarcinoma of breast ([place])    left   . Cellulitis of leg, left   . Diabetes mellitus without complication ([place])   . Family history of breast cancer 08/16/2011  . Hyperlipidemia   . Hypothyroidism   . MRSA (methicillin resistant staph aureus) culture positive 08/19/2011    Past Surgical History:  Procedure Laterality Date  . BREAST SURGERY Left    mastectomy  . CESAREAN SECTION     x2  . COLONOSCOPY N/A 03/03/2020   Procedure: COLONOSCOPY;  Surgeon: Daneil Dolin, MD;  [facility];  Service: Endoscopy;  Laterality: N/A;  12:00  . left mastectomy    . POLYPECTOMY  03/03/2020   Procedure: POLYPECTOMY;  Surgeon: Daneil Dolin, MD;  [facility];  Service: Endoscopy;;    Social History   Socioeconomic History  . Marital status: Married    Spouse name: Not on file  . Number of children: 4  . Years of education: Not on file  . Highest education level: Not on file  Occupational History  . Not on file  Tobacco Use  . Smoking status: Former Smoker    Packs/day: 0.50    Years: 18.00    Pack years: 9.00    Types: Cigarettes    Quit date: 07/01/2002    Years since quitting: 18.1  . Smokeless tobacco: Never Used  Substance and Sexual Activity  . Alcohol use: No  . Drug use: No  . Sexual activity: Not Currently  Other Topics Concern  . Not on file  Social History Narrative  . Not on file   Social Determinants of Health   Financial Resource Strain:   . Difficulty of Paying Living Expenses: Not on file  Food Insecurity:   . Worried About Charity fundraiser in the Last Year: Not on file  . Ran Out of Food in the  Last Year: Not on file  Transportation Needs:   . Lack of Transportation (Medical): Not on file  . Lack of Transportation (Non-Medical): Not on file  Physical Activity:   . Days of Exercise per Week: Not on file  . Minutes of Exercise per Session: Not on file  Stress:   . Feeling of Stress : Not on file  Social Connections:   . Frequency of Communication with Friends and Family: Not on file  . Frequency of Social Gatherings with Friends and Family: Not on file  . Attends Religious Services: Not on file  . Active Member of Clubs or Organizations: Not on file  . Attends Archivist Meetings: Not on file  . Marital Status: Not on file    Family History  Problem Relation Age of Onset  . Hypertension Mother   . Diabetes Mother   . Hyperlipidemia Mother   .  COPD Brother   . Cancer Sister        breast  . Cancer Sister        breast    Outpatient Encounter Medications as of 09/06/2020  Medication Sig  . atorvastatin (LIPITOR) 10 MG tablet TAKE 1 TABLET BY MOUTH DAILY  . EPINEPHrine 0.3 mg/0.3 mL IJ SOAJ injection INJECT 0.3MLS(1 SYRINGE) INTO THE MUSCLE ONCE AS NEEDED FOR ALLERGIC REACTION (Patient taking differently: Inject 0.3 mg into the muscle as needed for anaphylaxis. )  . fexofenadine (ALLEGRA) 180 MG tablet Take 180 mg by mouth daily.  Marland Kitchen levothyroxine (SYNTHROID) 88 MCG tablet TAKE 1 TABLET(88 MCG) BY MOUTH DAILY BEFORE BREAKFAST  . Multiple Vitamin (MULTIVITAMIN) tablet Take 1 tablet by mouth daily.    Marland Kitchen UNABLE TO FIND MASTECTOMY BRA AND PROTHESIS DX Z90.12  . UNABLE TO FIND 6 mastectomy prosthesis and bras.  . [DISCONTINUED] levothyroxine (SYNTHROID) 88 MCG tablet TAKE 1 TABLET(88 MCG) BY MOUTH DAILY BEFORE BREAKFAST   No facility-administered encounter medications on file as of 09/06/2020.    ALLERGIES: Allergies  Allergen Reactions  . Sulfonamide Derivatives Itching  . Yellow Jacket Venom [Bee Venom] Rash    urticaria   VACCINATION STATUS: Immunization  History  Administered Date(s) Administered  . Influenza Split 08/06/2012  . Influenza,inj,Quad PF,6+ Mos 07/22/2018, 06/28/2019, 06/28/2020  . Moderna SARS-COVID-2 Vaccination 12/07/2019, 01/07/2020  . Pneumococcal Conjugate-13 03/29/2015  . Tdap 03/12/2011  . Zoster Recombinat (Shingrix) 05/05/2018, 07/02/2018     HPI    Deanna Bush  is a patient with the above medical history.  Her history starts at approximate age of 80 when she had Graves' disease which required RAI thyroid ablation.  She is currently on levothyroxine 88 mcg p.o. daily before breakfast.  She reports compliance to his medication.  She has no new complaints today.  Her previsit thyroid function tests are consistent with appropriate replacement.     She denies fatigue, palpitations, tremors, heat/cold intolerance. -She reports fluctuating body weight. -She denies dysphagia, shortness of breath, no voice change.  She underwent thyroid ultrasound recently which did not show significant goiter , nor any discrete thyroid nodules.    she has family history of  thyroid disorders in her mother.  No family history of thyroid cancer.  -She exercises regularly, non-smoker.  Her medical history also includes hyperlipidemia for which she is taking atorvastatin 10 mg p.o. nightly. -She denies history of diabetes, however review of her medical records show A1c of 6.5 recently which is consistent with type 2 diabetes.  She also has hyperlipidemia on management with PMD.   ROS:  Limited as above.   Physical Exam: BP 122/68   Pulse 72   Ht 5\' 6"  (1.676 m)   Wt 195 lb 8 oz (88.7 kg)   BMI 31.55 kg/m  Wt Readings from Last 3 Encounters:  09/06/20 195 lb 8 oz (88.7 kg)  08/28/20 195 lb (88.5 kg)  08/25/20 197 lb (89.4 kg)    Constitutional:  Body mass index is 31.55 kg/m., not in acute distress, normal state of mind   CMP ( most recent) CMP     Component Value Date/Time   NA 137 08/25/2020 2104   NA 142 11/15/2019  1102   K 3.6 08/25/2020 2104   CL 104 08/25/2020 2104   CO2 25 08/25/2020 2104   GLUCOSE 88 08/25/2020 2104   BUN 15 08/25/2020 2104   BUN 13 11/15/2019 1102   CREATININE 0.79 08/25/2020 2104  CREATININE 0.73 04/22/2019 0848   CALCIUM 9.4 08/25/2020 2104   PROT 7.2 11/15/2019 1102   ALBUMIN 4.2 11/15/2019 1102   AST 19 11/15/2019 1102   ALT 13 11/15/2019 1102   ALKPHOS 92 11/15/2019 1102   BILITOT 0.3 11/15/2019 1102   GFRNONAA >60 08/25/2020 2104   GFRNONAA 90 04/22/2019 0848   GFRAA 104 11/15/2019 1102   GFRAA 104 04/22/2019 0848     Diabetic Labs (most recent): Lab Results  Component Value Date   HGBA1C 6.4 (A) 06/28/2020   HGBA1C 6.5 (H) 11/15/2019   HGBA1C 6.4 (H) 04/22/2019     Lipid Panel ( most recent) Lipid Panel     Component Value Date/Time   CHOL 180 11/15/2019 1102   TRIG 54 11/15/2019 1102   HDL 65 11/15/2019 1102   CHOLHDL 2.7 10/23/2018 1148   VLDL 7 08/12/2016 0913   LDLCALC 104 (H) 11/15/2019 1102   LDLCALC 100 (H) 10/23/2018 1148   LABVLDL 11 11/15/2019 1102       Lab Results  Component Value Date   TSH 1.060 09/04/2020   TSH 8.37 (H) 03/28/2020   TSH 30.55 (H) 01/03/2020   TSH 0.054 (L) 11/15/2019   TSH 5.03 (H) 04/22/2019   TSH 3.27 10/23/2018   TSH 0.34 (L) 05/05/2018   TSH 0.10 (L) 03/16/2018   TSH 1.22 10/11/2017   TSH 7.96 (H) 06/14/2017   FREET4 1.30 09/04/2020   FREET4 1.1 03/28/2020   FREET4 0.7 (L) 01/03/2020       ASSESSMENT: 1. Hypothyroidism   2.  Type 2 diabetes  3.  Hyperlipidemia  PLAN:  -Her previsit thyroid function tests are consistent with appropriate replacement.  She is advised to continue levothyroxine 88 mcg p.o. daily before breakfast.     - We discussed about the correct intake of her thyroid hormone, on empty stomach at fasting, with water, separated by at least 30 minutes from breakfast and other medications,  and separated by more than 4 hours from calcium, iron, multivitamins, acid reflux  medications (PPIs). -Patient is made aware of the fact that thyroid hormone replacement is needed for life, dose to be adjusted by periodic monitoring of thyroid function tests.  -Her recent thyroid ultrasound was reviewed, no goiter, no discrete nodules. -She has controlled type 2 diabetes with A1c of 6.4%, encouraged to continue exercise and diet.  She is also encouraged to continue her atorvastatin for dyslipidemia.  She is advised to maintain close follow-up with her PMD Dr. Tula Nakayama.     - Time spent on this patient care encounter:  20 minutes of which 50% was spent in  counseling and the rest reviewing  her current and  previous labs / studies and medications  doses and developing a plan for long term care. [name]  participated in the discussions, expressed understanding, and voiced agreement with the above plans.  All questions were answered to her satisfaction. she is encouraged to contact clinic should she have any questions or concerns prior to her return visit.    Return in about 1 year (around 09/06/2021) for F/U with Pre-visit Labs.  Glade Lloyd, MD [facility] [address] [place], [place] [address] Phone: [phone]  Fax: [phone]   09/06/2020, 8:33 AM  This note was partially dictated with voice recognition software. Similar sounding words can be transcribed inadequately or may not  be corrected upon review.

## 2020-09-18 ENCOUNTER — Ambulatory Visit (HOSPITAL_COMMUNITY): Payer: No Typology Code available for payment source

## 2020-10-04 ENCOUNTER — Ambulatory Visit (HOSPITAL_COMMUNITY): Payer: No Typology Code available for payment source

## 2020-10-23 ENCOUNTER — Ambulatory Visit (HOSPITAL_COMMUNITY): Payer: No Typology Code available for payment source

## 2020-10-30 ENCOUNTER — Other Ambulatory Visit: Payer: Self-pay

## 2020-10-30 ENCOUNTER — Ambulatory Visit (INDEPENDENT_AMBULATORY_CARE_PROVIDER_SITE_OTHER): Payer: No Typology Code available for payment source | Admitting: Internal Medicine

## 2020-10-30 ENCOUNTER — Ambulatory Visit (HOSPITAL_COMMUNITY): Payer: No Typology Code available for payment source

## 2020-10-30 ENCOUNTER — Encounter: Payer: Self-pay | Admitting: Internal Medicine

## 2020-10-30 VITALS — BP 155/80 | HR 64 | Temp 97.0°F | Ht 66.0 in | Wt 193.0 lb

## 2020-10-30 DIAGNOSIS — Z0000 Encounter for general adult medical examination without abnormal findings: Secondary | ICD-10-CM | POA: Diagnosis not present

## 2020-10-30 DIAGNOSIS — I10 Essential (primary) hypertension: Secondary | ICD-10-CM | POA: Insufficient documentation

## 2020-10-30 DIAGNOSIS — E782 Mixed hyperlipidemia: Secondary | ICD-10-CM

## 2020-10-30 DIAGNOSIS — R7303 Prediabetes: Secondary | ICD-10-CM

## 2020-10-30 DIAGNOSIS — R3121 Asymptomatic microscopic hematuria: Secondary | ICD-10-CM | POA: Diagnosis not present

## 2020-10-30 LAB — POCT URINALYSIS DIPSTICK
Bilirubin, UA: NEGATIVE
Glucose, UA: NEGATIVE
Ketones, UA: NEGATIVE
Leukocytes, UA: NEGATIVE
Nitrite, UA: NEGATIVE
Protein, UA: POSITIVE — AB
Spec Grav, UA: 1.025 (ref 1.010–1.025)
Urobilinogen, UA: 0.2 E.U./dL
pH, UA: 5.5 (ref 5.0–8.0)

## 2020-10-30 MED ORDER — AMLODIPINE BESYLATE 5 MG PO TABS: 5.0000 mg | ORAL_TABLET | Freq: Every day | ORAL | 2 refills | Status: DC

## 2020-10-30 MED ORDER — ATORVASTATIN CALCIUM 10 MG PO TABS: ORAL_TABLET | ORAL | 1 refills | Status: DC

## 2020-10-30 NOTE — Assessment & Plan Note (Signed)
Diet controlled Check HbA1C

## 2020-10-30 NOTE — Assessment & Plan Note (Signed)
BP Readings from Last 1 Encounters:  10/30/20 (!) 155/80   New-onset Reviewed BP readings from previous visits, have been around 140/80 Started Amlodipine 5 mg QD Counseled for compliance with the medications Advised DASH diet and moderate exercise/walking, at least 150 mins/week

## 2020-10-30 NOTE — Assessment & Plan Note (Signed)
Annual exam as documented. Counseling done  re healthy lifestyle involving commitment to 150 minutes exercise per week, heart healthy diet, and attaining healthy weight.The importance of adequate sleep also discussed. Changes in health habits are decided on by the patient with goals and time frames  set for achieving them. Immunization and cancer screening needs are specifically addressed at this visit. 

## 2020-10-30 NOTE — Progress Notes (Signed)
Established Patient Office Visit  Subjective:  Patient ID: Deanna Bush, female    DOB: [birth year]  Age: 60 y.o. MRN: [id number]  CC:  Chief Complaint  Patient presents with  . Annual Exam    CPE    HPI Deanna Bush is a 60 year old female with PMH of HLD, hypothyroidism and allergies who presents for annual physical.  Patient's blood pressure was 176/61 in the office today, which was found to be 155/80 upon repeat check.  She denies any headache, dizziness, chest pain, dyspnea or palpitations.  Previous BP readings from the prior visits were reviewed, which were found to be borderline high or high during most of the visits.    Past Medical History:  Diagnosis Date  . Adenocarcinoma of breast ([place])    left   . Cellulitis of leg, left   . Diabetes mellitus without complication ([place])   . Family history of breast cancer 08/16/2011  . Hyperlipidemia   . Hypothyroidism   . MRSA (methicillin resistant staph aureus) culture positive 08/19/2011    Past Surgical History:  Procedure Laterality Date  . BREAST SURGERY Left    mastectomy  . CESAREAN SECTION     x2  . COLONOSCOPY N/A 03/03/2020   Procedure: COLONOSCOPY;  Surgeon: Daneil Dolin, MD;  [facility];  Service: Endoscopy;  Laterality: N/A;  12:00  . left mastectomy    . POLYPECTOMY  03/03/2020   Procedure: POLYPECTOMY;  Surgeon: Daneil Dolin, MD;  [facility];  Service: Endoscopy;;    Family History  Problem Relation Age of Onset  . Hypertension Mother   . Diabetes Mother   . Hyperlipidemia Mother   . COPD Brother   . Cancer Sister        breast  . Cancer Sister        breast    Social History   Socioeconomic History  . Marital status: Married    Spouse name: Not on file  . Number of children: 4  . Years of education: Not on file  . Highest education level: Not on file  Occupational History  . Not on file  Tobacco Use  . Smoking status: Former Smoker    Packs/day: 0.50     Years: 18.00    Pack years: 9.00    Types: Cigarettes    Quit date: 07/01/2002    Years since quitting: 18.3  . Smokeless tobacco: Never Used  Substance and Sexual Activity  . Alcohol use: No  . Drug use: No  . Sexual activity: Not Currently  Other Topics Concern  . Not on file  Social History Narrative  . Not on file   Social Determinants of Health   Financial Resource Strain: Not on file  Food Insecurity: Not on file  Transportation Needs: Not on file  Physical Activity: Not on file  Stress: Not on file  Social Connections: Not on file  Intimate Partner Violence: Not on file    Outpatient Medications Prior to Visit  Medication Sig Dispense Refill  . EPINEPHrine 0.3 mg/0.3 mL IJ SOAJ injection INJECT 0.3MLS(1 SYRINGE) INTO THE MUSCLE ONCE AS NEEDED FOR ALLERGIC REACTION (Patient taking differently: Inject 0.3 mg into the muscle as needed for anaphylaxis.) 2 each 0  . fexofenadine (ALLEGRA) 180 MG tablet Take 180 mg by mouth daily.    Marland Kitchen levothyroxine (SYNTHROID) 88 MCG tablet TAKE 1 TABLET(88 MCG) BY MOUTH DAILY BEFORE BREAKFAST 90 tablet 3  . Multiple Vitamin (MULTIVITAMIN)  tablet Take 1 tablet by mouth daily.    Marland Kitchen UNABLE TO FIND MASTECTOMY BRA AND PROTHESIS DX Z90.12 6 each 2  . UNABLE TO FIND 6 mastectomy prosthesis and bras. 6 each 0  . atorvastatin (LIPITOR) 10 MG tablet TAKE 1 TABLET BY MOUTH DAILY 90 tablet 1   No facility-administered medications prior to visit.    Allergies  Allergen Reactions  . Sulfonamide Derivatives Itching  . Yellow Jacket Venom [Bee Venom] Rash    urticaria    ROS Review of Systems  Constitutional: Negative for chills and fever.  HENT: Negative for congestion, sinus pressure, sinus pain and sore throat.   Eyes: Negative for pain and discharge.  Respiratory: Negative for cough and shortness of breath.   Cardiovascular: Negative for chest pain and palpitations.  Gastrointestinal: Negative for abdominal pain, constipation, diarrhea,  nausea and vomiting.  Endocrine: Negative for polydipsia and polyuria.  Genitourinary: Negative for difficulty urinating, dysuria, flank pain, frequency, hematuria, urgency, vaginal discharge and vaginal pain.  Musculoskeletal: Negative for neck pain and neck stiffness.  Skin: Negative for rash.  Neurological: Negative for dizziness and weakness.  Psychiatric/Behavioral: Negative for agitation and behavioral problems.      Objective:    Physical Exam Vitals reviewed.  Constitutional:      General: She is not in acute distress.    Appearance: She is not diaphoretic.  HENT:     Head: Normocephalic and atraumatic.     Nose: Nose normal. No congestion.     Mouth/Throat:     Mouth: Mucous membranes are moist.     Pharynx: No posterior oropharyngeal erythema.  Eyes:     General: No scleral icterus.    Extraocular Movements: Extraocular movements intact.     Pupils: Pupils are equal, round, and reactive to light.  Cardiovascular:     Rate and Rhythm: Normal rate and regular rhythm.     Pulses: Normal pulses.     Heart sounds: Normal heart sounds. No murmur heard.   Pulmonary:     Breath sounds: Normal breath sounds. No wheezing or rales.  Abdominal:     Palpations: Abdomen is soft.     Tenderness: There is no abdominal tenderness.  Musculoskeletal:     Cervical back: Neck supple. No tenderness.     Right lower leg: No edema.     Left lower leg: No edema.  Skin:    General: Skin is warm.     Findings: No rash.  Neurological:     General: No focal deficit present.     Mental Status: She is alert and oriented to person, place, and time.     Sensory: No sensory deficit.     Motor: No weakness.  Psychiatric:        Mood and Affect: Mood normal.        Behavior: Behavior normal.     BP (!) 155/80 (BP Location: Right Arm, Patient Position: Sitting)   Pulse 64   Temp (!) 97 F (36.1 C) (Temporal)   Ht $R'5\' 6"'Dq$  (1.676 m)   Wt 193 lb (87.5 kg)   SpO2 100%   BMI 31.15 kg/m   Wt Readings from Last 3 Encounters:  10/30/20 193 lb (87.5 kg)  09/06/20 195 lb 8 oz (88.7 kg)  08/28/20 195 lb (88.5 kg)     Health Maintenance Due  Topic Date Due  . URINE MICROALBUMIN  10/24/2019  . PAP SMEAR-Modifier  10/14/2020    There are no preventive care reminders  to display for this patient.  Lab Results  Component Value Date   TSH 1.060 09/04/2020   Lab Results  Component Value Date   WBC 4.1 04/22/2019   HGB 12.1 04/22/2019   HCT 38.0 04/22/2019   MCV 94.1 04/22/2019   PLT 278 04/22/2019   Lab Results  Component Value Date   NA 137 08/25/2020   K 3.6 08/25/2020   CO2 25 08/25/2020   GLUCOSE 88 08/25/2020   BUN 15 08/25/2020   CREATININE 0.79 08/25/2020   BILITOT 0.3 11/15/2019   ALKPHOS 92 11/15/2019   AST 19 11/15/2019   ALT 13 11/15/2019   PROT 7.2 11/15/2019   ALBUMIN 4.2 11/15/2019   CALCIUM 9.4 08/25/2020   ANIONGAP 8 08/25/2020   Lab Results  Component Value Date   CHOL 180 11/15/2019   Lab Results  Component Value Date   HDL 65 11/15/2019   Lab Results  Component Value Date   LDLCALC 104 (H) 11/15/2019   Lab Results  Component Value Date   TRIG 54 11/15/2019   Lab Results  Component Value Date   CHOLHDL 2.7 10/23/2018   Lab Results  Component Value Date   HGBA1C 6.4 (A) 06/28/2020      Assessment & Plan:   Problem List Items Addressed This Visit      Annual physical exam - Primary   Annual exam as documented. Counseling done  re healthy lifestyle involving commitment to 150 minutes exercise per week, heart healthy diet, and attaining healthy weight.The importance of adequate sleep also discussed. Changes in health habits are decided on by the patient with goals and time frames  set for achieving them. Immunization and cancer screening needs are specifically addressed at this visit.     Relevant Orders  CMP14+EGFR  Hemoglobin A1c  Lipid panel  TSH  Vitamin D (25 hydroxy)  CBC with Differential     Cardiovascular and Mediastinum   Primary hypertension    BP Readings from Last 1 Encounters:  10/30/20 (!) 155/80   New-onset Reviewed BP readings from previous visits, have been around 140/80 Started Amlodipine 5 mg QD Counseled for compliance with the medications Advised DASH diet and moderate exercise/walking, at least 150 mins/week       Relevant Medications   amLODipine (NORVASC) 5 MG tablet   atorvastatin (LIPITOR) 10 MG tablet     Genitourinary   Asymptomatic microscopic hematuria    Large RBCs on the UA previously, patient is asymptomatic Check UA again If positive, will discuss about further investigations.      Relevant Orders   Urinalysis     Other   Mixed hyperlipidemia   Relevant Medications   amLODipine (NORVASC) 5 MG tablet   atorvastatin (LIPITOR) 10 MG tablet      Prediabetes    Diet controlled Check HbA1C      Relevant Orders   CMP14+EGFR   Hemoglobin A1c   Lipid panel   Microalbumin, urine      Meds ordered this encounter  Medications  . amLODipine (NORVASC) 5 MG tablet    Sig: Take 1 tablet (5 mg total) by mouth daily.    Dispense:  30 tablet    Refill:  2  . atorvastatin (LIPITOR) 10 MG tablet    Sig: TAKE 1 TABLET BY MOUTH DAILY    Dispense:  90 tablet    Refill:  1    Follow-up: Return in about 6 weeks (around 12/11/2020).    Lindell Spar, MD

## 2020-10-30 NOTE — Patient Instructions (Addendum)
Please start taking Amlodipine as prescribed.  Please check your BP at home and contact us if it remains above 150/90 consistently.  Please follow DASH diet and perform moderate exercise/walking at least 150 mins/week.  DASH stands for Dietary Approaches to Stop Hypertension. The DASH diet is a healthy-eating plan designed to help treat or prevent high blood pressure (hypertension).  The DASH diet includes foods that are rich in potassium, calcium and magnesium. These nutrients help control blood pressure. The diet limits foods that are high in sodium, saturated fat and added sugars.  Studies have shown that the DASH diet can lower blood pressure in as little as two weeks. The diet can also lower low-density lipoprotein (LDL or "bad") cholesterol levels in the blood. High blood pressure and high LDL cholesterol levels are two major risk factors for heart disease and stroke.    DASH diet: Recommended servings The DASH diet provides daily and weekly nutritional goals. The number of servings you should have depends on your daily calorie needs.  Here's a look at the recommended servings from each food group for a 2,000-calorie-a-day DASH diet:  Grains: 6 to 8 servings a day. One serving is one slice bread, 1 ounce dry cereal, or 1/2 cup cooked cereal, rice or pasta. Vegetables: 4 to 5 servings a day. One serving is 1 cup raw leafy green vegetable, 1/2 cup cut-up raw or cooked vegetables, or 1/2 cup vegetable juice. Fruits: 4 to 5 servings a day. One serving is one medium fruit, 1/2 cup fresh, frozen or canned fruit, or 1/2 cup fruit juice. Fat-free or low-fat dairy products: 2 to 3 servings a day. One serving is 1 cup milk or yogurt, or 1 1/2 ounces cheese. Lean meats, poultry and fish: six 1-ounce servings or fewer a day. One serving is 1 ounce cooked meat, poultry or fish, or 1 egg. Nuts, seeds and legumes: 4 to 5 servings a week. One serving is 1/3 cup nuts, 2 tablespoons peanut butter, 2  tablespoons seeds, or 1/2 cup cooked legumes (dried beans or peas). Fats and oils: 2 to 3 servings a day. One serving is 1 teaspoon soft margarine, 1 teaspoon vegetable oil, 1 tablespoon mayonnaise or 2 tablespoons salad dressing. Sweets and added sugars: 5 servings or fewer a week. One serving is 1 tablespoon sugar, jelly or jam, 1/2 cup sorbet, or 1 cup lemonade.

## 2020-10-30 NOTE — Assessment & Plan Note (Signed)
Large RBCs on the UA previously, patient is asymptomatic Check UA again If positive, will discuss about further investigations.

## 2020-10-31 ENCOUNTER — Other Ambulatory Visit: Payer: Self-pay | Admitting: *Deleted

## 2020-10-31 ENCOUNTER — Other Ambulatory Visit: Payer: Self-pay

## 2020-10-31 DIAGNOSIS — E782 Mixed hyperlipidemia: Secondary | ICD-10-CM

## 2020-10-31 DIAGNOSIS — I10 Essential (primary) hypertension: Secondary | ICD-10-CM

## 2020-10-31 MED ORDER — ATORVASTATIN CALCIUM 10 MG PO TABS: ORAL_TABLET | ORAL | 1 refills | Status: DC

## 2020-10-31 MED ORDER — AMLODIPINE BESYLATE 5 MG PO TABS: 5.0000 mg | ORAL_TABLET | Freq: Every day | ORAL | 2 refills | Status: DC

## 2020-11-01 ENCOUNTER — Encounter: Payer: No Typology Code available for payment source | Admitting: Nurse Practitioner

## 2020-11-01 ENCOUNTER — Encounter: Payer: No Typology Code available for payment source | Admitting: Family Medicine

## 2020-11-01 LAB — CBC WITH DIFFERENTIAL/PLATELET
Basophils Absolute: 0 10*3/uL (ref 0.0–0.2)
Basos: 0 %
EOS (ABSOLUTE): 0 10*3/uL (ref 0.0–0.4)
Eos: 1 %
Hematocrit: 34.6 % (ref 34.0–46.6)
Hemoglobin: 11.7 g/dL (ref 11.1–15.9)
Immature Grans (Abs): 0 10*3/uL (ref 0.0–0.1)
Immature Granulocytes: 0 %
Lymphocytes Absolute: 1.5 10*3/uL (ref 0.7–3.1)
Lymphs: 31 %
MCH: 29.7 pg (ref 26.6–33.0)
MCHC: 33.8 g/dL (ref 31.5–35.7)
MCV: 88 fL (ref 79–97)
Monocytes Absolute: 0.3 10*3/uL (ref 0.1–0.9)
Monocytes: 6 %
Neutrophils Absolute: 3 10*3/uL (ref 1.4–7.0)
Neutrophils: 62 %
Platelets: 319 10*3/uL (ref 150–450)
RBC: 3.94 x10E6/uL (ref 3.77–5.28)
RDW: 11.9 % (ref 11.7–15.4)
WBC: 4.8 10*3/uL (ref 3.4–10.8)

## 2020-11-01 LAB — URINALYSIS
Bilirubin, UA: NEGATIVE
Glucose, UA: NEGATIVE
Ketones, UA: NEGATIVE
Leukocytes,UA: NEGATIVE
Nitrite, UA: NEGATIVE
Specific Gravity, UA: 1.024 (ref 1.005–1.030)
Urobilinogen, Ur: 0.2 mg/dL (ref 0.2–1.0)
pH, UA: 5.5 (ref 5.0–7.5)

## 2020-11-01 LAB — LIPID PANEL
Chol/HDL Ratio: 3.1 ratio (ref 0.0–4.4)
Cholesterol, Total: 170 mg/dL (ref 100–199)
HDL: 54 mg/dL (ref >39)
LDL Chol Calc (NIH): 103 mg/dL — ABNORMAL HIGH (ref 0–99)
Triglycerides: 69 mg/dL (ref 0–149)
VLDL Cholesterol Cal: 13 mg/dL (ref 5–40)

## 2020-11-01 LAB — CMP14+EGFR
ALT: 11 IU/L (ref 0–32)
AST: 16 IU/L (ref 0–40)
Albumin/Globulin Ratio: 1.2 (ref 1.2–2.2)
Albumin: 4.1 g/dL (ref 3.8–4.8)
Alkaline Phosphatase: 105 IU/L (ref 44–121)
BUN/Creatinine Ratio: 17 (ref 12–28)
BUN: 13 mg/dL (ref 8–27)
Bilirubin Total: 0.2 mg/dL (ref 0.0–1.2)
CO2: 25 mmol/L (ref 20–29)
Calcium: 9.8 mg/dL (ref 8.7–10.3)
Chloride: 103 mmol/L (ref 96–106)
Creatinine, Ser: 0.77 mg/dL (ref 0.57–1.00)
GFR calc Af Amer: 96 mL/min/{1.73_m2} (ref >59)
GFR calc non Af Amer: 84 mL/min/{1.73_m2} (ref >59)
Globulin, Total: 3.3 g/dL (ref 1.5–4.5)
Glucose: 119 mg/dL — ABNORMAL HIGH (ref 65–99)
Potassium: 4.3 mmol/L (ref 3.5–5.2)
Sodium: 140 mmol/L (ref 134–144)
Total Protein: 7.4 g/dL (ref 6.0–8.5)

## 2020-11-01 LAB — VITAMIN D 25 HYDROXY (VIT D DEFICIENCY, FRACTURES): Vit D, 25-Hydroxy: 40.1 ng/mL (ref 30.0–100.0)

## 2020-11-01 LAB — HEMOGLOBIN A1C
Est. average glucose Bld gHb Est-mCnc: 137 mg/dL
Hgb A1c MFr Bld: 6.4 % — ABNORMAL HIGH (ref 4.8–5.6)

## 2020-11-01 LAB — TSH: TSH: 0.657 u[IU]/mL (ref 0.450–4.500)

## 2020-11-01 LAB — MICROALBUMIN, URINE: Microalbumin, Urine: 40.2 ug/mL

## 2020-11-28 ENCOUNTER — Other Ambulatory Visit: Payer: Self-pay

## 2020-11-28 ENCOUNTER — Telehealth: Payer: Self-pay

## 2020-11-28 DIAGNOSIS — E890 Postprocedural hypothyroidism: Secondary | ICD-10-CM

## 2020-11-28 MED ORDER — LEVOTHYROXINE SODIUM 88 MCG PO TABS: ORAL_TABLET | ORAL | 3 refills | Status: DC

## 2020-11-28 NOTE — Telephone Encounter (Signed)
Patient said she needs a refill on levothyroxine (SYNTHROID) 88 MCG tablet. She can no longer use [facility] she has to use [facility] [address] [place] [address]

## 2020-11-28 NOTE — Telephone Encounter (Signed)
Sent in

## 2020-12-04 ENCOUNTER — Other Ambulatory Visit: Payer: Self-pay | Admitting: "Endocrinology

## 2020-12-04 DIAGNOSIS — E890 Postprocedural hypothyroidism: Secondary | ICD-10-CM

## 2020-12-11 ENCOUNTER — Encounter: Payer: Self-pay | Admitting: Internal Medicine

## 2020-12-11 ENCOUNTER — Other Ambulatory Visit: Payer: Self-pay

## 2020-12-11 ENCOUNTER — Ambulatory Visit: Payer: No Typology Code available for payment source | Admitting: Internal Medicine

## 2020-12-11 VITALS — BP 125/71 | HR 69 | Resp 18 | Ht 66.0 in | Wt 192.4 lb

## 2020-12-11 DIAGNOSIS — I10 Essential (primary) hypertension: Secondary | ICD-10-CM

## 2020-12-11 DIAGNOSIS — N761 Subacute and chronic vaginitis: Secondary | ICD-10-CM | POA: Diagnosis not present

## 2020-12-11 DIAGNOSIS — E038 Other specified hypothyroidism: Secondary | ICD-10-CM | POA: Diagnosis not present

## 2020-12-11 MED ORDER — FLUCONAZOLE 150 MG PO TABS: 150.0000 mg | ORAL_TABLET | Freq: Once | ORAL | 0 refills | Status: AC

## 2020-12-11 NOTE — Progress Notes (Signed)
Established Patient Office Visit  Subjective:  Patient ID: Deanna Bush, female    DOB: 1961/08/08  Age: 60 y.o. MRN: [id number]  CC:  Chief Complaint  Patient presents with  . Follow-up    6 week follow up     HPI Deanna Bush presents for follow up of HTN. She has started taking Amlodipine for HTN and has been tolerating it well. She denies any headache, dizziness, chest pain, dyspnea or palpitations. Her BP was 125/71 in the office today.  She has been taking Levothyroxine 88 mcg QD regularly. She denies any constipation, recent weight change, hair or skin changes.  She is going to have a root canal procedure soon, for which she was prescribed Antibiotic. She asks for prophylactic medication for fungal infection, which she has to take anytime she takes antibiotic.  Past Medical History:  Diagnosis Date  . Adenocarcinoma of breast ([place])    left   . Cellulitis of leg, left   . Diabetes mellitus without complication ([place])   . Family history of breast cancer 08/16/2011  . Hyperlipidemia   . Hypothyroidism   . MRSA (methicillin resistant staph aureus) culture positive 08/19/2011    Past Surgical History:  Procedure Laterality Date  . BREAST SURGERY Left    mastectomy  . CESAREAN SECTION     x2  . COLONOSCOPY N/A 03/03/2020   Procedure: COLONOSCOPY;  Surgeon: Daneil Dolin, MD;  [facility];  Service: Endoscopy;  Laterality: N/A;  12:00  . left mastectomy    . POLYPECTOMY  03/03/2020   Procedure: POLYPECTOMY;  Surgeon: Daneil Dolin, MD;  Location: [facility];  Service: Endoscopy;;    Family History  Problem Relation Age of Onset  . Hypertension Mother   . Diabetes Mother   . Hyperlipidemia Mother   . COPD Brother   . Cancer Sister        breast  . Cancer Sister        breast    Social History   Socioeconomic History  . Marital status: Married    Spouse name: Not on file  . Number of children: 4  . Years of education: Not on file  .  Highest education level: Not on file  Occupational History  . Not on file  Tobacco Use  . Smoking status: Former Smoker    Packs/day: 0.50    Years: 18.00    Pack years: 9.00    Types: Cigarettes    Quit date: 07/01/2002    Years since quitting: 18.4  . Smokeless tobacco: Never Used  Substance and Sexual Activity  . Alcohol use: No  . Drug use: No  . Sexual activity: Not Currently  Other Topics Concern  . Not on file  Social History Narrative  . Not on file   Social Determinants of Health   Financial Resource Strain: Not on file  Food Insecurity: Not on file  Transportation Needs: Not on file  Physical Activity: Not on file  Stress: Not on file  Social Connections: Not on file  Intimate Partner Violence: Not on file    Outpatient Medications Prior to Visit  Medication Sig Dispense Refill  . amLODipine (NORVASC) 5 MG tablet Take 1 tablet (5 mg total) by mouth daily. 30 tablet 2  . atorvastatin (LIPITOR) 10 MG tablet TAKE 1 TABLET BY MOUTH DAILY 90 tablet 1  . EPINEPHrine 0.3 mg/0.3 mL IJ SOAJ injection INJECT 0.3MLS(1 SYRINGE) INTO THE MUSCLE ONCE AS NEEDED  FOR ALLERGIC REACTION (Patient taking differently: Inject 0.3 mg into the muscle as needed for anaphylaxis.) 2 each 0  . fexofenadine (ALLEGRA) 180 MG tablet Take 180 mg by mouth daily.    Marland Kitchen levothyroxine (SYNTHROID) 88 MCG tablet TAKE 1 TABLET(88 MCG) BY MOUTH DAILY BEFORE BREAKFAST 90 tablet 3  . Multiple Vitamin (MULTIVITAMIN) tablet Take 1 tablet by mouth daily.    Marland Kitchen UNABLE TO FIND MASTECTOMY BRA AND PROTHESIS DX Z90.12 6 each 2  . UNABLE TO FIND 6 mastectomy prosthesis and bras. 6 each 0  . amoxicillin (AMOXIL) 500 MG capsule Take 500 mg by mouth every 8 (eight) hours.     No facility-administered medications prior to visit.    Allergies  Allergen Reactions  . Sulfonamide Derivatives Itching  . Yellow Jacket Venom [Bee Venom] Rash    urticaria    ROS Review of Systems  Constitutional: Negative for chills  and fever.  HENT: Negative for congestion, sinus pressure, sinus pain and sore throat.   Eyes: Negative for pain and discharge.  Respiratory: Negative for cough and shortness of breath.   Cardiovascular: Negative for chest pain and palpitations.  Gastrointestinal: Negative for abdominal pain, constipation, diarrhea, nausea and vomiting.  Endocrine: Negative for polydipsia and polyuria.  Genitourinary: Negative for difficulty urinating, dysuria, flank pain, frequency, hematuria, urgency, vaginal discharge and vaginal pain.  Musculoskeletal: Negative for neck pain and neck stiffness.  Skin: Negative for rash.  Neurological: Negative for dizziness and weakness.  Psychiatric/Behavioral: Negative for agitation and behavioral problems.      Objective:    Physical Exam Vitals reviewed.  Constitutional:      General: She is not in acute distress.    Appearance: She is not diaphoretic.  HENT:     Head: Normocephalic and atraumatic.     Nose: Nose normal. No congestion.     Mouth/Throat:     Mouth: Mucous membranes are moist.     Pharynx: No posterior oropharyngeal erythema.  Eyes:     General: No scleral icterus.    Extraocular Movements: Extraocular movements intact.     Pupils: Pupils are equal, round, and reactive to light.  Cardiovascular:     Rate and Rhythm: Normal rate and regular rhythm.     Pulses: Normal pulses.     Heart sounds: Normal heart sounds. No murmur heard.   Pulmonary:     Breath sounds: Normal breath sounds. No wheezing or rales.  Abdominal:     Palpations: Abdomen is soft.     Tenderness: There is no abdominal tenderness.  Musculoskeletal:     Cervical back: Neck supple. No tenderness.     Right lower leg: No edema.     Left lower leg: No edema.  Skin:    General: Skin is warm.     Findings: No rash.  Neurological:     General: No focal deficit present.     Mental Status: She is alert and oriented to person, place, and time.     Sensory: No sensory  deficit.     Motor: No weakness.  Psychiatric:        Mood and Affect: Mood normal.        Behavior: Behavior normal.     BP 125/71 (BP Location: Right Arm, Patient Position: Sitting, Cuff Size: Normal)   Pulse 69   Resp 18   Ht 5\' 6"  (1.676 m)   Wt 192 lb 6.4 oz (87.3 kg)   SpO2 99%   BMI 31.05 kg/m  Wt Readings from Last 3 Encounters:  12/11/20 192 lb 6.4 oz (87.3 kg)  10/30/20 193 lb (87.5 kg)  09/06/20 195 lb 8 oz (88.7 kg)     Health Maintenance Due  Topic Date Due  . PAP SMEAR-Modifier  10/14/2020    There are no preventive care reminders to display for this patient.  Lab Results  Component Value Date   TSH 0.657 10/30/2020   Lab Results  Component Value Date   WBC 4.8 10/30/2020   HGB 11.7 10/30/2020   HCT 34.6 10/30/2020   MCV 88 10/30/2020   PLT 319 10/30/2020   Lab Results  Component Value Date   NA 140 10/30/2020   K 4.3 10/30/2020   CO2 25 10/30/2020   GLUCOSE 119 (H) 10/30/2020   BUN 13 10/30/2020   CREATININE 0.77 10/30/2020   BILITOT 0.2 10/30/2020   ALKPHOS 105 10/30/2020   AST 16 10/30/2020   ALT 11 10/30/2020   PROT 7.4 10/30/2020   ALBUMIN 4.1 10/30/2020   CALCIUM 9.8 10/30/2020   ANIONGAP 8 08/25/2020   Lab Results  Component Value Date   CHOL 170 10/30/2020   Lab Results  Component Value Date   HDL 54 10/30/2020   Lab Results  Component Value Date   LDLCALC 103 (H) 10/30/2020   Lab Results  Component Value Date   TRIG 69 10/30/2020   Lab Results  Component Value Date   CHOLHDL 3.1 10/30/2020   Lab Results  Component Value Date   HGBA1C 6.4 (H) 10/30/2020      Assessment & Plan:   Problem List Items Addressed This Visit      Cardiovascular and Mediastinum   Primary hypertension - Primary    BP Readings from Last 1 Encounters:  12/11/20 125/71   Well-controlled with Amlodipine 5 mg QD Counseled for compliance with the medications Advised DASH diet and moderate exercise/walking, at least 150  mins/week       Relevant Orders   CBC   Lipid panel   Basic Metabolic Panel (BMET)     Endocrine   Hypothyroidism    Lab Results  Component Value Date   TSH 0.657 10/30/2020   On Levothyroxine 88 mcg QD Check TSH and free T4 before the next visit      Relevant Orders   TSH + free T4    Other Visit Diagnoses    Subacute vaginitis       Relevant Medications   fluconazole (DIFLUCAN) 150 MG tablet      Meds ordered this encounter  Medications  . fluconazole (DIFLUCAN) 150 MG tablet    Sig: Take 1 tablet (150 mg total) by mouth once for 1 dose.    Dispense:  1 tablet    Refill:  0    Follow-up: Return in about 4 months (around 04/12/2021) for HTN and hypothyroidism.    Lindell Spar, MD

## 2020-12-11 NOTE — Assessment & Plan Note (Signed)
Lab Results  Component Value Date   TSH 0.657 10/30/2020   On Levothyroxine 88 mcg QD Check TSH and free T4 before the next visit

## 2020-12-11 NOTE — Patient Instructions (Signed)
Please continue to take medications as prescribed.  Please continue to follow DASH diet and perform moderate exercise/walking as tolerated.  Please get fasting blood tests done before the next visit.

## 2020-12-11 NOTE — Assessment & Plan Note (Signed)
BP Readings from Last 1 Encounters:  12/11/20 125/71   Well-controlled with Amlodipine 5 mg QD Counseled for compliance with the medications Advised DASH diet and moderate exercise/walking, at least 150 mins/week

## 2020-12-23 ENCOUNTER — Emergency Department (HOSPITAL_COMMUNITY)
Admission: EM | Admit: 2020-12-23 | Discharge: 2020-12-24 | Disposition: A | Discharge status: Home / Self Care | Payer: PRIVATE HEALTH INSURANCE | Attending: Emergency Medicine | Admitting: Emergency Medicine

## 2020-12-23 ENCOUNTER — Encounter (HOSPITAL_COMMUNITY): Payer: Self-pay

## 2020-12-23 ENCOUNTER — Other Ambulatory Visit: Payer: Self-pay

## 2020-12-23 DIAGNOSIS — Z87891 Personal history of nicotine dependence: Secondary | ICD-10-CM | POA: Insufficient documentation

## 2020-12-23 DIAGNOSIS — R319 Hematuria, unspecified: Secondary | ICD-10-CM | POA: Diagnosis present

## 2020-12-23 DIAGNOSIS — N3001 Acute cystitis with hematuria: Secondary | ICD-10-CM

## 2020-12-23 DIAGNOSIS — Z853 Personal history of malignant neoplasm of breast: Secondary | ICD-10-CM | POA: Insufficient documentation

## 2020-12-23 DIAGNOSIS — E119 Type 2 diabetes mellitus without complications: Secondary | ICD-10-CM | POA: Diagnosis not present

## 2020-12-23 DIAGNOSIS — Z79899 Other long term (current) drug therapy: Secondary | ICD-10-CM | POA: Insufficient documentation

## 2020-12-23 DIAGNOSIS — I10 Essential (primary) hypertension: Secondary | ICD-10-CM | POA: Insufficient documentation

## 2020-12-23 DIAGNOSIS — N3000 Acute cystitis without hematuria: Secondary | ICD-10-CM

## 2020-12-23 DIAGNOSIS — N2889 Other specified disorders of kidney and ureter: Secondary | ICD-10-CM

## 2020-12-23 DIAGNOSIS — E039 Hypothyroidism, unspecified: Secondary | ICD-10-CM | POA: Diagnosis not present

## 2020-12-23 LAB — CBC WITH DIFFERENTIAL/PLATELET
Abs Immature Granulocytes: 0.01 10*3/uL (ref 0.00–0.07)
Basophils Absolute: 0 10*3/uL (ref 0.0–0.1)
Basophils Relative: 0 %
Eosinophils Absolute: 0.1 10*3/uL (ref 0.0–0.5)
Eosinophils Relative: 1 %
HCT: 32.7 % — ABNORMAL LOW (ref 36.0–46.0)
Hemoglobin: 10.3 g/dL — ABNORMAL LOW (ref 12.0–15.0)
Immature Granulocytes: 0 %
Lymphocytes Relative: 39 %
Lymphs Abs: 2.7 10*3/uL (ref 0.7–4.0)
MCH: 29.3 pg (ref 26.0–34.0)
MCHC: 31.5 g/dL (ref 30.0–36.0)
MCV: 92.9 fL (ref 80.0–100.0)
Monocytes Absolute: 0.6 10*3/uL (ref 0.1–1.0)
Monocytes Relative: 8 %
Neutro Abs: 3.5 10*3/uL (ref 1.7–7.7)
Neutrophils Relative %: 52 %
Platelets: 313 10*3/uL (ref 150–400)
RBC: 3.52 MIL/uL — ABNORMAL LOW (ref 3.87–5.11)
RDW: 13.9 % (ref 11.5–15.5)
WBC: 6.9 10*3/uL (ref 4.0–10.5)
nRBC: 0 % (ref 0.0–0.2)

## 2020-12-23 LAB — URINALYSIS, ROUTINE W REFLEX MICROSCOPIC

## 2020-12-23 LAB — COMPREHENSIVE METABOLIC PANEL
ALT: 13 U/L (ref 0–44)
AST: 18 U/L (ref 15–41)
Albumin: 3.8 g/dL (ref 3.5–5.0)
Alkaline Phosphatase: 81 U/L (ref 38–126)
Anion gap: 11 (ref 5–15)
BUN: 17 mg/dL (ref 8–23)
CO2: 22 mmol/L (ref 22–32)
Calcium: 9.3 mg/dL (ref 8.9–10.3)
Chloride: 103 mmol/L (ref 98–111)
Creatinine, Ser: 0.74 mg/dL (ref 0.44–1.00)
GFR, Estimated: >60 mL/min (ref >60)
Glucose, Bld: 86 mg/dL (ref 70–99)
Potassium: 3.7 mmol/L (ref 3.5–5.1)
Sodium: 136 mmol/L (ref 135–145)
Total Bilirubin: 0.5 mg/dL (ref 0.3–1.2)
Total Protein: 7.3 g/dL (ref 6.5–8.1)

## 2020-12-23 LAB — URINALYSIS, MICROSCOPIC (REFLEX)
RBC / HPF: >50 RBC/hpf (ref 0–5)
Squamous Epithelial / HPF: NONE SEEN (ref 0–5)
WBC, UA: NONE SEEN WBC/hpf (ref 0–5)

## 2020-12-23 MED ORDER — CEPHALEXIN 500 MG PO CAPS: 500.0000 mg | ORAL_CAPSULE | Freq: Four times a day (QID) | ORAL | 0 refills | Status: DC

## 2020-12-23 NOTE — ED Provider Notes (Signed)
[facility] EMERGENCY DEPARTMENT Provider Note   CSN: [id number] Arrival date & time: 12/23/20  1923     History Chief Complaint  Patient presents with  . Hematuria    Deanna Bush is a 60 y.o. female.  Patient complains of blood in her urine.  This happened 1 time before but she never got reevaluated.  Patient not having pain  The history is provided by the patient and medical records. No language interpreter was used.  Hematuria This is a new problem. The current episode started 6 to 12 hours ago. The problem occurs constantly. The problem has not changed since onset.Pertinent negatives include no chest pain, no abdominal pain and no headaches. Nothing aggravates the symptoms. Nothing relieves the symptoms.       Past Medical History:  Diagnosis Date  . Adenocarcinoma of breast ([place])    left   . Cellulitis of leg, left   . Diabetes mellitus without complication ([place])   . Family history of breast cancer 08/16/2011  . Hyperlipidemia   . Hypothyroidism   . MRSA (methicillin resistant staph aureus) culture positive 08/19/2011    Patient Active Problem List   Diagnosis Date Noted  . Primary hypertension 10/30/2020  . Asymptomatic microscopic hematuria 10/30/2020  . Urinary tract infection without hematuria 02/28/2020  . Hypothyroidism following radioiodine therapy 01/28/2020  . Urinary urgency 12/04/2018  . Prediabetes 03/29/2015  . Annual physical exam 07/20/2014  . Osteoarthritis of left knee 08/26/2012  . Vitamin D deficiency 08/04/2010  . Obesity (BMI 30.0-34.9) 07/20/2008  . Malignant neoplasm of female breast ([place]) 02/04/2008  . Hypothyroidism 02/04/2008  . Mixed hyperlipidemia 02/04/2008    Past Surgical History:  Procedure Laterality Date  . BREAST SURGERY Left    mastectomy  . CESAREAN SECTION     x2  . COLONOSCOPY N/A 03/03/2020   Procedure: COLONOSCOPY;  Surgeon: Daneil Dolin, MD;  [facility];  Service: Endoscopy;  Laterality: N/A;   12:00  . left mastectomy    . POLYPECTOMY  03/03/2020   Procedure: POLYPECTOMY;  Surgeon: Daneil Dolin, MD;  [facility];  Service: Endoscopy;;     OB History    Gravida  2   Para  2   Term  2   Preterm      AB      Living  2     SAB      IAB      Ectopic      Multiple      Live Births              Family History  Problem Relation Age of Onset  . Hypertension Mother   . Diabetes Mother   . Hyperlipidemia Mother   . COPD Brother   . Cancer Sister        breast  . Cancer Sister        breast    Social History   Tobacco Use  . Smoking status: Former Smoker    Packs/day: 0.50    Years: 18.00    Pack years: 9.00    Types: Cigarettes    Quit date: 07/01/2002    Years since quitting: 18.4  . Smokeless tobacco: Never Used  Vaping Use  . Vaping Use: Never used  Substance Use Topics  . Alcohol use: No  . Drug use: No    Home Medications Prior to Admission medications   Medication Sig Start Date End Date Taking? Authorizing Provider  amLODipine (NORVASC) 5 MG tablet Take 1 tablet (5 mg total) by mouth daily. 10/31/20  Yes Lindell Spar, MD  atorvastatin (LIPITOR) 10 MG tablet TAKE 1 TABLET BY MOUTH DAILY 10/31/20  Yes Fayrene Helper, MD  EPINEPHrine 0.3 mg/0.3 mL IJ SOAJ injection INJECT 0.3MLS(1 SYRINGE) INTO THE MUSCLE ONCE AS NEEDED FOR ALLERGIC REACTION Patient taking differently: Inject 0.3 mg into the muscle as needed for anaphylaxis. 04/29/19  Yes Fayrene Helper, MD  fexofenadine (ALLEGRA) 180 MG tablet Take 180 mg by mouth daily.   Yes [provider]  fluconazole (DIFLUCAN) 150 MG tablet Take 150 mg by mouth once. 12/11/20  Yes [provider]  levothyroxine (SYNTHROID) 88 MCG tablet TAKE 1 TABLET(88 MCG) BY MOUTH DAILY BEFORE BREAKFAST Patient taking differently: Take 88 mcg by mouth daily before breakfast. 12/04/20  Yes Nida, Marella Chimes, MD  Multiple Vitamin (MULTIVITAMIN) tablet Take 1 tablet by  mouth daily.   Yes [provider]  amoxicillin (AMOXIL) 500 MG capsule Take 500 mg by mouth every 8 (eight) hours. Patient not taking: No sig reported 12/04/20   [provider]  [place] DX Z90.12 10/16/17   Fayrene Helper, MD  UNABLE TO FIND 6 mastectomy prosthesis and bras. 06/28/20   Fayrene Helper, MD    Allergies    Sulfonamide derivatives and Yellow jacket venom [bee venom]  Review of Systems   Review of Systems  Constitutional: Negative for appetite change and fatigue.  HENT: Negative for congestion, ear discharge and sinus pressure.   Eyes: Negative for discharge.  Respiratory: Negative for cough.   Cardiovascular: Negative for chest pain.  Gastrointestinal: Negative for abdominal pain and diarrhea.  Genitourinary: Positive for hematuria. Negative for frequency.  Musculoskeletal: Negative for back pain.  Skin: Negative for rash.  Neurological: Negative for seizures and headaches.  Psychiatric/Behavioral: Negative for hallucinations.    Physical Exam Updated Vital Signs BP (!) 157/79 (BP Location: Right Arm)   Pulse 60   Temp 98 F (36.7 C) (Oral)   Resp 18   Ht 5\' 6"  (1.676 m)   Wt 88.2 kg   SpO2 98%   BMI 31.38 kg/m   Physical Exam Vitals and nursing note reviewed.  Constitutional:      Appearance: She is well-developed.  HENT:     Head: Normocephalic.     Nose: Nose normal.  Eyes:     General: No scleral icterus.    Conjunctiva/sclera: Conjunctivae normal.  Neck:     Thyroid: No thyromegaly.  Cardiovascular:     Rate and Rhythm: Normal rate and regular rhythm.     Heart sounds: No murmur heard. No friction rub. No gallop.   Pulmonary:     Breath sounds: No stridor. No wheezing or rales.  Chest:     Chest wall: No tenderness.  Abdominal:     General: There is no distension.     Tenderness: There is no abdominal tenderness. There is no rebound.  Musculoskeletal:        General: Normal  range of motion.     Cervical back: Neck supple.  Lymphadenopathy:     Cervical: No cervical adenopathy.  Skin:    Findings: No erythema or rash.  Neurological:     Mental Status: She is alert and oriented to person, place, and time.     Motor: No abnormal muscle tone.     Coordination: Coordination normal.  Psychiatric:  Behavior: Behavior normal.     ED Results / Procedures / Treatments   Labs (all labs ordered are listed, but only abnormal results are displayed) Labs Reviewed  URINALYSIS, ROUTINE W REFLEX MICROSCOPIC - Abnormal; Notable for the following components:      Result Value   Color, Urine RED (*)    APPearance CLOUDY (*)    Glucose, UA   (*)    Value: TEST NOT REPORTED DUE TO COLOR INTERFERENCE OF URINE PIGMENT   Hgb urine dipstick   (*)    Value: TEST NOT REPORTED DUE TO COLOR INTERFERENCE OF URINE PIGMENT   Bilirubin Urine   (*)    Value: TEST NOT REPORTED DUE TO COLOR INTERFERENCE OF URINE PIGMENT   Ketones, ur   (*)    Value: TEST NOT REPORTED DUE TO COLOR INTERFERENCE OF URINE PIGMENT   Protein, ur   (*)    Value: TEST NOT REPORTED DUE TO COLOR INTERFERENCE OF URINE PIGMENT   Nitrite   (*)    Value: TEST NOT REPORTED DUE TO COLOR INTERFERENCE OF URINE PIGMENT   Leukocytes,Ua   (*)    Value: TEST NOT REPORTED DUE TO COLOR INTERFERENCE OF URINE PIGMENT   All other components within normal limits  CBC WITH DIFFERENTIAL/PLATELET - Abnormal; Notable for the following components:   RBC 3.52 (*)    Hemoglobin 10.3 (*)    HCT 32.7 (*)    All other components within normal limits  URINALYSIS, MICROSCOPIC (REFLEX) - Abnormal; Notable for the following components:   Bacteria, UA RARE (*)    All other components within normal limits  URINE CULTURE  COMPREHENSIVE METABOLIC PANEL    EKG None  Radiology No results found.  Procedures Procedures   Medications Ordered in ED Medications - No data to display  ED Course  I have reviewed the triage  vital signs and the nursing notes.  Pertinent labs & imaging results that were available during my care of the patient were reviewed by me and considered in my medical decision making (see chart for details).    MDM Rules/Calculators/A&P                         Patient with hematuria,,   she will have CT scan of her kidney Final Clinical Impression(s) / ED Diagnoses Final diagnoses:  None    Rx / DC Orders ED Discharge Orders    None       Milton Ferguson, MD 12/25/20 1131

## 2020-12-23 NOTE — Discharge Instructions (Addendum)
Follow-up with urology in the next 1 to 2 days.  The contact information for [facility] here in [place] has been provided in this discharge summary for you to call Monday morning and make these arrangements.  Return to the emergency department if you develop severe pain, and inability to urinate, or other new and concerning symptoms.

## 2020-12-23 NOTE — ED Triage Notes (Signed)
Pt arrives via POV c/o recurrent hematuria. Pt reports this is the same as it was last time she was sent here from Urgent to be evaluated for this problem. Pt denies back and abdominal pain at this time.

## 2020-12-24 ENCOUNTER — Emergency Department (HOSPITAL_COMMUNITY): Payer: PRIVATE HEALTH INSURANCE

## 2020-12-24 NOTE — ED Provider Notes (Signed)
Care assumed from Dr. Roderic Palau at shift change.  Patient presenting here with painless hematuria.  She had an episode of this several months ago which was diagnosed as a UTI.  She was treated with antibiotics and the hematuria went away.  This occurred again today and presents for evaluation of it.  Care was signed out to me awaiting results of a renal CT.  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  Patient will be referred to urology for further work-up.  Patient with just blood in her urine and no signs or symptoms of infection.  Will forego antibiotics at this time.   Veryl Speak, MD 12/24/20 [phone]

## 2020-12-25 LAB — URINE CULTURE: Culture: NO GROWTH

## 2020-12-27 ENCOUNTER — Ambulatory Visit: Payer: No Typology Code available for payment source | Admitting: Urology

## 2021-01-01 ENCOUNTER — Ambulatory Visit (INDEPENDENT_AMBULATORY_CARE_PROVIDER_SITE_OTHER): Payer: No Typology Code available for payment source | Admitting: Urology

## 2021-01-01 ENCOUNTER — Other Ambulatory Visit: Payer: Self-pay

## 2021-01-01 ENCOUNTER — Encounter: Payer: Self-pay | Admitting: Urology

## 2021-01-01 VITALS — BP 148/69 | HR 82 | Temp 98.2°F | Ht 66.0 in | Wt 191.0 lb

## 2021-01-01 DIAGNOSIS — N2889 Other specified disorders of kidney and ureter: Secondary | ICD-10-CM | POA: Diagnosis not present

## 2021-01-01 LAB — URINALYSIS, ROUTINE W REFLEX MICROSCOPIC
Bilirubin, UA: NEGATIVE
Glucose, UA: NEGATIVE
Ketones, UA: NEGATIVE
Leukocytes,UA: NEGATIVE
Nitrite, UA: NEGATIVE
Protein,UA: NEGATIVE
Specific Gravity, UA: 1.015 (ref 1.005–1.030)
Urobilinogen, Ur: 0.2 mg/dL (ref 0.2–1.0)
pH, UA: 7.5 (ref 5.0–7.5)

## 2021-01-01 LAB — MICROSCOPIC EXAMINATION
Bacteria, UA: NONE SEEN
RBC, Urine: >30 /hpf — AB (ref 0–2)
Renal Epithel, UA: NONE SEEN /hpf

## 2021-01-01 NOTE — Patient Instructions (Signed)

## 2021-01-01 NOTE — Progress Notes (Signed)
01/01/2021 2:42 PM   Deanna Bush 01/18/1961 [id number]  Referring provider: Fayrene Helper, MD [address] [place],  [place] [address]  Right renal mass  HPI: Deanna Bush is 60NT here for evaluation of a right renal mass. On February 18th she had gross hematuria. She presented to urgent care and was then sent to ER. She was treated for nephrolithiasis. The hematuria occurred again and she presented to the ER 3/19 and underwent CT stone study which showed a right 9cm renal mass. CMP normal. Creatinine 0.74. Grandfather had kidney cancer and had a nephrectomy. No unexplained weight loss. NO chills/night sweats.    PMH: Past Medical History:  Diagnosis Date  . Adenocarcinoma of breast ([facility])    left   . Cellulitis of leg, left   . Diabetes mellitus without complication ([facility])   . Family history of breast cancer 08/16/2011  . Hyperlipidemia   . Hypothyroidism   . MRSA (methicillin resistant staph aureus) culture positive 08/19/2011    Surgical History: Past Surgical History:  Procedure Laterality Date  . BREAST SURGERY Left    mastectomy  . CESAREAN SECTION     x2  . COLONOSCOPY N/A 03/03/2020   Procedure: COLONOSCOPY;  Surgeon: Daneil Dolin, MD;  [facility]: [facility];  Service: Endoscopy;  Laterality: N/A;  12:00  . left mastectomy    . POLYPECTOMY  03/03/2020   Procedure: POLYPECTOMY;  Surgeon: Daneil Dolin, MD;  Location: [facility];  Service: Endoscopy;;    Home Medications:  Allergies as of 01/01/2021      Reactions   Sulfonamide Derivatives Itching   Yellow Jacket Venom [bee Venom] Rash   urticaria      Medication List       Accurate as of January 01, 2021  2:42 PM. If you have any questions, ask your nurse or doctor.        STOP taking these medications   amoxicillin 500 MG capsule Commonly known as: AMOXIL Stopped by: Nicolette Bang, MD     TAKE these medications   amLODipine 5 MG tablet Commonly known as: NORVASC Take 1  tablet (5 mg total) by mouth daily.   atorvastatin 10 MG tablet Commonly known as: LIPITOR TAKE 1 TABLET BY MOUTH DAILY   EPINEPHrine 0.3 mg/0.3 mL Soaj injection Commonly known as: EPI-PEN INJECT 0.3MLS(1 SYRINGE) INTO THE MUSCLE ONCE AS NEEDED FOR ALLERGIC REACTION What changed:   how much to take  how to take this  when to take this  reasons to take this  additional instructions   fexofenadine 180 MG tablet Commonly known as: ALLEGRA Take 180 mg by mouth daily.   fluconazole 150 MG tablet Commonly known as: DIFLUCAN Take 150 mg by mouth once.   levothyroxine 88 MCG tablet Commonly known as: SYNTHROID TAKE 1 TABLET(88 MCG) BY MOUTH DAILY BEFORE BREAKFAST What changed: See the new instructions.   multivitamin tablet Take 1 tablet by mouth daily.   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       Allergies:  Allergies  Allergen Reactions  . Sulfonamide Derivatives Itching  . Yellow Jacket Venom [Bee Venom] Rash    urticaria    Family History: Family History  Problem Relation Age of Onset  . Hypertension Mother   . Diabetes Mother   . Hyperlipidemia Mother   . COPD Brother   . Cancer Sister  breast  . Cancer Sister        breast    Social History:  reports that she quit smoking about 18 years ago. Her smoking use included cigarettes. She has a 9.00 pack-year smoking history. She has never used smokeless tobacco. She reports that she does not drink alcohol and does not use drugs.  ROS: All other review of systems were reviewed and are negative except what is noted above in HPI  Physical Exam: BP (!) 148/69   Pulse 82   Temp 98.2 F (36.8 C)   Ht 5\' 6"  (1.676 m)   Wt 191 lb (86.6 kg)   BMI 30.83 kg/m   Constitutional:  Alert and oriented, No acute distress. HEENT: [place] AT, moist mucus membranes.  Trachea midline, no masses. Cardiovascular: No clubbing, cyanosis, or  edema. Respiratory: Normal respiratory effort, no increased work of breathing. GI: Abdomen is soft, nontender, nondistended, no abdominal masses GU: No CVA tenderness.  Lymph: No cervical or inguinal lymphadenopathy. Skin: No rashes, bruises or suspicious lesions. Neurologic: Grossly intact, no focal deficits, moving all 4 extremities. Psychiatric: Normal mood and affect.  Laboratory Data: Lab Results  Component Value Date   WBC 6.9 12/23/2020   HGB 10.3 (L) 12/23/2020   HCT 32.7 (L) 12/23/2020   MCV 92.9 12/23/2020   PLT 313 12/23/2020    Lab Results  Component Value Date   CREATININE 0.74 12/23/2020    No results found for: PSA  No results found for: TESTOSTERONE  Lab Results  Component Value Date   HGBA1C 6.4 (H) 10/30/2020    Urinalysis    Component Value Date/Time   COLORURINE RED (A) 12/23/2020 1939   APPEARANCEUR CLOUDY (A) 12/23/2020 1939   APPEARANCEUR Clear 10/30/2020 0914   LABSPEC 1.002 (L) 08/25/2020 2020   PHURINE 7.0 08/25/2020 2020   GLUCOSEU (A) 12/23/2020 1939    TEST NOT REPORTED DUE TO COLOR INTERFERENCE OF URINE PIGMENT   HGBUR (A) 12/23/2020 1939    TEST NOT REPORTED DUE TO COLOR INTERFERENCE OF URINE PIGMENT   BILIRUBINUR (A) 12/23/2020 1939    TEST NOT REPORTED DUE TO COLOR INTERFERENCE OF URINE PIGMENT   BILIRUBINUR negative 10/30/2020 1157   BILIRUBINUR Negative 10/30/2020 0914   KETONESUR (A) 12/23/2020 1939    TEST NOT REPORTED DUE TO COLOR INTERFERENCE OF URINE PIGMENT   PROTEINUR (A) 12/23/2020 1939    TEST NOT REPORTED DUE TO COLOR INTERFERENCE OF URINE PIGMENT   UROBILINOGEN 0.2 10/30/2020 1157   NITRITE (A) 12/23/2020 1939    TEST NOT REPORTED DUE TO COLOR INTERFERENCE OF URINE PIGMENT   LEUKOCYTESUR (A) 12/23/2020 1939    TEST NOT REPORTED DUE TO COLOR INTERFERENCE OF URINE PIGMENT    Lab Results  Component Value Date   LABMICR 40.2 10/30/2020   BACTERIA RARE (A) 12/23/2020    Pertinent Imaging: CT stone 12/23/2020:  Images reviewed and discussed with the patient No results found for this or any previous visit.  No results found for this or any previous visit.  No results found for this or any previous visit.  No results found for this or any previous visit.  No results found for this or any previous visit.  No results found for this or any previous visit.  No results found for this or any previous visit.  Results for orders placed during the hospital encounter of 12/23/20  CT RENAL STONE STUDY  Narrative CLINICAL DATA:  60 year old female with flank pain. Concern for kidney stone.  EXAM: CT ABDOMEN AND PELVIS WITHOUT CONTRAST  TECHNIQUE: Multidetector CT imaging of the abdomen and pelvis was performed following the standard protocol without IV contrast.  COMPARISON:  None.  FINDINGS: Evaluation of this exam is limited in the absence of intravenous contrast.  Lower chest: Bibasilar linear atelectasis/scarring. There is a 3 mm right lower lobe nodule (8/4). The visualized lung bases are otherwise clear. There is hypoattenuation of the cardiac blood pool suggestive of anemia. Clinical correlation is recommended.  No intra-abdominal free air or free fluid.  Hepatobiliary: Apparent infiltration of the right renal mass into the posterior right lobe of the liver (segment VII). The liver is otherwise unremarkable. No intrahepatic biliary dilatation. No calcified gallstone or pericholecystic fluid.  Pancreas: Unremarkable. No pancreatic ductal dilatation or surrounding inflammatory changes.  Spleen: Normal in size without focal abnormality.  Adrenals/Urinary Tract: The adrenal glands unremarkable. There is a solid predominantly hypoattenuating right renal upper pole mass measuring approximately 6 x 9 cm in greatest axial dimensions and 11 cm in craniocaudal length most consistent with malignancy. Further characterization with renal mass protocol MRI on a nonemergent/outpatient  basis recommended. There is superior extension of the mass with apparent invasion into the right lobe of the liver. There is probable extension of the mass into the right renal pelvis with a degree of obstruction and mild right hydronephrosis. There is no hydronephrosis or nephrolithiasis on the left. The visualized ureters and urinary bladder appear unremarkable.  Stomach/Bowel: There is moderate stool throughout the colon. No bowel obstruction or active inflammation. The appendix is normal.  Vascular/Lymphatic: Mild aortoiliac atherosclerotic disease. The IVC is unremarkable. Evaluation for possible tumor thrombus is limited on this noncontrast CT. CT with IV contrast may provide better evaluation. No portal venous gas. No adenopathy.  Reproductive: The uterus is anteverted and grossly unremarkable. No adnexal masses.  Other: Left mastectomy with partially visualized prosthesis.  Musculoskeletal: Degenerative changes of the spine. No acute osseous pathology.  IMPRESSION: 1. Large right renal upper pole mass most consistent with malignancy. CT with IV contrast may provide better evaluation of potential tumor thrombus. 2. Mild right hydronephrosis secondary to extension of the mass into the right renal pelvis. 3. No bowel obstruction. Normal appendix. 4. A 3 mm right lower lobe nodule. 5. Aortic Atherosclerosis (ICD10-I70.0).   Electronically Signed By: Anner Crete M.D. On: 12/24/2020 00:49   Assessment & Plan:    1. Renal mass We discussed the natural hx of renal masses and the 80/20 malignant/benign likelihood. We disucssed the treatment options including active surveillance. Renal ablation, partial and radical nephrectomy. We will obtain MRI prior to scheduling surgery  - Urinalysis, Routine w reflex microscopic     No follow-ups on file.  Nicolette Bang, MD  [facility] [place]

## 2021-01-01 NOTE — Progress Notes (Signed)
Urological Symptom Review  Patient is experiencing the following symptoms: Blood in urine   Review of Systems  Gastrointestinal (upper)  : Negative for upper GI symptoms  Gastrointestinal (lower) : Negative for lower GI symptoms  Constitutional : Negative for symptoms  Skin: Negative for skin symptoms  Eyes: Negative for eye symptoms  Ear/Nose/Throat : Negative for Ear/Nose/Throat symptoms  Hematologic/Lymphatic: Negative for Hematologic/Lymphatic symptoms  Cardiovascular : Negative for cardiovascular symptoms  Respiratory : Negative for respiratory symptoms  Endocrine: Negative for endocrine symptoms  Musculoskeletal: Negative for musculoskeletal symptoms  Neurological: Negative for neurological symptoms  Psychologic: Negative for psychiatric symptoms  

## 2021-01-02 ENCOUNTER — Other Ambulatory Visit: Payer: Self-pay

## 2021-01-02 ENCOUNTER — Ambulatory Visit (HOSPITAL_COMMUNITY)
Admission: RE | Admit: 2021-01-02 | Discharge: 2021-01-02 | Disposition: A | Discharge status: Home / Self Care | Payer: PRIVATE HEALTH INSURANCE | Source: Ambulatory Visit | Attending: Urology | Admitting: Urology

## 2021-01-02 DIAGNOSIS — N2889 Other specified disorders of kidney and ureter: Secondary | ICD-10-CM | POA: Insufficient documentation

## 2021-01-02 MED ORDER — GADOBUTROL 1 MMOL/ML IV SOLN
7.0000 mL | Freq: Once | INTRAVENOUS | Status: AC | PRN
  Administered 2021-01-02: 7 mL via INTRAVENOUS

## 2021-01-09 ENCOUNTER — Telehealth: Payer: Self-pay

## 2021-01-09 NOTE — Telephone Encounter (Signed)
MRI  [phone] (H)

## 2021-01-16 ENCOUNTER — Other Ambulatory Visit: Payer: Self-pay | Admitting: Family Medicine

## 2021-01-16 DIAGNOSIS — E782 Mixed hyperlipidemia: Secondary | ICD-10-CM

## 2021-01-22 ENCOUNTER — Telehealth: Payer: Self-pay

## 2021-01-22 NOTE — Telephone Encounter (Signed)
Patient had MRI- please fill posting sheet if proceeding with surgery.

## 2021-01-22 NOTE — Telephone Encounter (Signed)
Patient came by office to check back on   When she will be having surgery Now having blood clots during urination - for a week   Call back: [phone]   Thanks, Helene Kelp

## 2021-01-24 ENCOUNTER — Other Ambulatory Visit: Payer: Self-pay

## 2021-01-24 DIAGNOSIS — N2889 Other specified disorders of kidney and ureter: Secondary | ICD-10-CM

## 2021-01-24 MED ORDER — MAGNESIUM CITRATE PO SOLN
1.0000 | Freq: Once | ORAL | 0 refills | Status: AC
Start: 2021-01-24 — End: 2021-01-24

## 2021-01-24 MED ORDER — FLEET ENEMA 7-19 GM/118ML RE ENEM: 1.0000 | ENEMA | Freq: Once | RECTAL | 0 refills | Status: DC

## 2021-01-24 NOTE — Telephone Encounter (Signed)
Dr. Alyson Ingles called and spoke with patient about MRI results.  Posting sheet received. Surgery requested for May 3rd at [facility].  I called patient and talked with patient about May 3rd and patient able to remain on ASA per Dr. Alyson Ingles. Patient understands she will do magnesium citrate the day before the procedure. Patient voiced understanding.

## 2021-01-26 ENCOUNTER — Telehealth: Payer: Self-pay

## 2021-01-26 NOTE — Telephone Encounter (Signed)
Called pt and asked for verified insurance info.  She has 2 ins and neither if valid in Epic.  Please correct ins and let me know you have talked with her and ins is corrected in Epic when she calls back.  I will not be in the office Monday am.

## 2021-01-29 ENCOUNTER — Ambulatory Visit: Payer: No Typology Code available for payment source | Admitting: Urology

## 2021-02-01 NOTE — Progress Notes (Addendum)
COVID Vaccine Completed: x3 Date COVID Vaccine completed:  12-07-19 & 01-07-20 Has received booster:08-09-20 COVID vaccine manufacturer:    Moderna  Date of COVID positive in last 90 days: N/A  PCP - Tula Nakayama, MD Cardiologist - N/A  Chest x-ray - N/A EKG - 02-02-21 Epic Stress Test - N/A ECHO - N/A Cardiac Cath - N/A Pacemaker/ICD device last checked: Spinal Cord Stimulator:  Sleep Study - N/A CPAP -   Fasting Blood Sugar - Less than 135.   Checks Blood Sugar - 1 times a day.  Pt checks on her own, not ordered by physician.  Blood Thinner Instructions: N/A Aspirin Instructions: Last Dose:  Activity level:  Can go up a flight of stairs and perform activities of daily living without stopping and without symptoms of chest pain or shortness of breath.  Able to exercise without symptoms of chest pain or SOB.  Pt walks three miles a day   Anesthesia review: N/A  Patient denies shortness of breath, fever, cough and chest pain at PAT appointment   Patient verbalized understanding of instructions that were given to them at the PAT appointment. Patient was also instructed that they will need to review over the PAT instructions again at home before surgery.

## 2021-02-01 NOTE — Patient Instructions (Addendum)
DUE TO COVID-19 ONLY ONE VISITOR IS ALLOWED TO COME WITH YOU AND STAY IN THE WAITING ROOM ONLY DURING PRE OP AND PROCEDURE.   **NO VISITORS ARE ALLOWED IN THE SHORT STAY AREA OR RECOVERY ROOM!!**  IF YOU WILL BE ADMITTED INTO THE HOSPITAL YOU ARE ALLOWED ONLY TWO SUPPORT PEOPLE DURING VISITATION HOURS ONLY (10AM -8PM)   . The support person(s) may change daily. . The support person(s) must pass our screening, gel in and out, and wear a mask at all times, including in the patient's room. . Patients must also wear a mask when staff or their support person are in the room.  No visitors under the age of 54. Any visitor under the age of 29 must be accompanied by an adult.    COVID SWAB TESTING MUST BE COMPLETED ON:   Friday, 02-02-21 @ 2:45 PM   [address] [place], [place] [address]  (Must self quarantine after testing. Follow instructions on handout.)       Your procedure is scheduled on:  Tuesday, 02-06-21   Report to [facility] Main  Entrance    Report to admitting at 11:00 AM   Call this number if you have problems the morning of surgery [phone]   Do not eat food :After Midnight.   May have liquids until 10:00 AM day of surgery  CLEAR LIQUID DIET  Foods Allowed                                                                     Foods Excluded  Water, Black Coffee and tea, regular and decaf             liquids that you cannot  Plain Jell-O in any flavor  (No red)                                    see through such as: Fruit ices (not with fruit pulp)                                      milk, soups, orange juice              Iced Popsicles (No red)                                      All solid food                                   Apple juices Sports drinks like Gatorade (No red) Lightly seasoned clear broth or consume(fat free) Sugar, honey syrup      Oral Hygiene is also important to reduce your risk of infection.                                     Remember - BRUSH YOUR TEETH THE MORNING OF SURGERY  WITH YOUR REGULAR TOOTHPASTE   Do NOT smoke after Midnight   Take these medicines the morning of surgery with A SIP OF WATER: Amlodipine, Atorvastatin, Allegra, Levothyroxine  DO NOT TAKE ANY ORAL DIABETIC MEDICATIONS DAY OF YOUR SURGERY                               You may not have any metal on your body including hair pins, jewelry, and body piercings             Do not wear make-up, lotions, powders, perfumes/cologne, or deodorant             Do not wear nail polish.  Do not shave  48 hours prior to surgery.           Do not bring valuables to the hospital. [place].   Contacts, dentures or bridgework may not be worn into surgery.   Bring small overnight bag day of surgery.                Please read over the following fact sheets you were given: IF YOU HAVE QUESTIONS ABOUT YOUR PRE OP INSTRUCTIONS PLEASE  CALL [facility] - Preparing for Surgery Before surgery, you can play an important role.  Because skin is not sterile, your skin needs to be as free of germs as possible.  You can reduce the number of germs on your skin by washing with CHG (chlorahexidine gluconate) soap before surgery.  CHG is an antiseptic cleaner which kills germs and bonds with the skin to continue killing germs even after washing. Please DO NOT use if you have an allergy to CHG or antibacterial soaps.  If your skin becomes reddened/irritated stop using the CHG and inform your nurse when you arrive at Short Stay. Do not shave (including legs and underarms) for at least 48 hours prior to the first CHG shower.  You may shave your face/neck.  Please follow these instructions carefully:  1.  Shower with CHG Soap the night before surgery and the  morning of surgery.  2.  If you choose to wash your hair, wash your hair first as usual with your normal  shampoo.  3.  After you shampoo, rinse your hair and  body thoroughly to remove the shampoo.                             4.  Use CHG as you would any other liquid soap.  You can apply chg directly to the skin and wash.  Gently with a scrungie or clean washcloth.  5.  Apply the CHG Soap to your body ONLY FROM THE NECK DOWN.   Do   not use on face/ open                           Wound or open sores. Avoid contact with eyes, ears mouth and   genitals (private parts).                       Wash face,  Genitals (private parts) with your normal soap.             6.  Wash thoroughly, paying special attention to the area where your    surgery  will be performed.  7.  Thoroughly rinse your body with warm water from the neck down.  8.  DO NOT shower/wash with your normal soap after using and rinsing off the CHG Soap.                9.  Pat yourself dry with a clean towel.            10.  Wear clean pajamas.            11.  Place clean sheets on your bed the night of your first shower and do not  sleep with pets. Day of Surgery : Do not apply any lotions/deodorants the morning of surgery.  Please wear clean clothes to the hospital/surgery center.  FAILURE TO FOLLOW THESE INSTRUCTIONS MAY RESULT IN THE CANCELLATION OF YOUR SURGERY  PATIENT SIGNATURE_________________________________  NURSE SIGNATURE__________________________________  ________________________________________________________________________  WHAT IS A BLOOD TRANSFUSION? Blood Transfusion Information  A transfusion is the replacement of blood or some of its parts. Blood is made up of multiple cells which provide different functions.  Red blood cells carry oxygen and are used for blood loss replacement.  White blood cells fight against infection.  Platelets control bleeding.  Plasma helps clot blood.  Other blood products are available for specialized needs, such as hemophilia or other clotting disorders. BEFORE THE TRANSFUSION  Who gives blood for transfusions?   Healthy  volunteers who are fully evaluated to make sure their blood is safe. This is blood bank blood. Transfusion therapy is the safest it has ever been in the practice of medicine. Before blood is taken from a donor, a complete history is taken to make sure that person has no history of diseases nor engages in risky social behavior (examples are intravenous drug use or sexual activity with multiple partners). The donor's travel history is screened to minimize risk of transmitting infections, such as malaria. The donated blood is tested for signs of infectious diseases, such as HIV and hepatitis. The blood is then tested to be sure it is compatible with you in order to minimize the chance of a transfusion reaction. If you or a relative donates blood, this is often done in anticipation of surgery and is not appropriate for emergency situations. It takes many days to process the donated blood. RISKS AND COMPLICATIONS Although transfusion therapy is very safe and saves many lives, the main dangers of transfusion include:   Getting an infectious disease.  Developing a transfusion reaction. This is an allergic reaction to something in the blood you were given. Every precaution is taken to prevent this. The decision to have a blood transfusion has been considered carefully by your caregiver before blood is given. Blood is not given unless the benefits outweigh the risks. AFTER THE TRANSFUSION  Right after receiving a blood transfusion, you will usually feel much better and more energetic. This is especially true if your red blood cells have gotten low (anemic). The transfusion raises the level of the red blood cells which carry oxygen, and this usually causes an energy increase.  The nurse administering the transfusion will monitor you carefully for complications. HOME CARE INSTRUCTIONS  No special instructions are needed after a transfusion. You may find your energy is better. Speak with your caregiver about any  limitations on activity for underlying diseases you may have. SEEK MEDICAL CARE IF:   Your condition is not improving after your transfusion.  You develop redness or irritation at the intravenous (IV) site. SEEK IMMEDIATE  MEDICAL CARE IF:  Any of the following symptoms occur over the next 12 hours:  Shaking chills.  You have a temperature by mouth above 102 F (38.9 C), not controlled by medicine.  Chest, back, or muscle pain.  People around you feel you are not acting correctly or are confused.  Shortness of breath or difficulty breathing.  Dizziness and fainting.  You get a rash or develop hives.  You have a decrease in urine output.  Your urine turns a dark color or changes to pink, red, or brown. Any of the following symptoms occur over the next 10 days:  You have a temperature by mouth above 102 F (38.9 C), not controlled by medicine.  Shortness of breath.  Weakness after normal activity.  The white part of the eye turns yellow (jaundice).  You have a decrease in the amount of urine or are urinating less often.  Your urine turns a dark color or changes to pink, red, or brown. Document Released: 09/20/2000 Document Revised: 12/16/2011 Document Reviewed: 05/09/2008 [facility] Patient Information 2014 [place], [place].  _______________________________________________________________________

## 2021-02-02 ENCOUNTER — Encounter (HOSPITAL_COMMUNITY): Payer: Self-pay

## 2021-02-02 ENCOUNTER — Encounter (HOSPITAL_COMMUNITY)
Admission: RE | Admit: 2021-02-02 | Discharge: 2021-02-02 | Disposition: A | Discharge status: Home / Self Care | Payer: PRIVATE HEALTH INSURANCE | Source: Ambulatory Visit | Attending: Urology | Admitting: Urology

## 2021-02-02 ENCOUNTER — Other Ambulatory Visit: Payer: Self-pay

## 2021-02-02 ENCOUNTER — Other Ambulatory Visit (HOSPITAL_COMMUNITY)
Admission: RE | Admit: 2021-02-02 | Discharge: 2021-02-02 | Disposition: A | Discharge status: Home / Self Care | Payer: PRIVATE HEALTH INSURANCE | Source: Ambulatory Visit | Attending: Urology | Admitting: Urology

## 2021-02-02 DIAGNOSIS — Z20822 Contact with and (suspected) exposure to covid-19: Secondary | ICD-10-CM | POA: Diagnosis not present

## 2021-02-02 DIAGNOSIS — Z01818 Encounter for other preprocedural examination: Secondary | ICD-10-CM | POA: Diagnosis present

## 2021-02-02 HISTORY — DX: Prediabetes: R73.03

## 2021-02-02 HISTORY — DX: Essential (primary) hypertension: I10

## 2021-02-02 HISTORY — DX: Other complications of anesthesia, initial encounter: T88.59XA

## 2021-02-02 LAB — PROTIME-INR
INR: 1 (ref 0.8–1.2)
Prothrombin Time: 13.4 seconds (ref 11.4–15.2)

## 2021-02-02 LAB — HEMOGLOBIN A1C
Hgb A1c MFr Bld: 6.6 % — ABNORMAL HIGH (ref 4.8–5.6)
Mean Plasma Glucose: 142.72 mg/dL

## 2021-02-02 LAB — CBC
HCT: 31.9 % — ABNORMAL LOW (ref 36.0–46.0)
Hemoglobin: 10.3 g/dL — ABNORMAL LOW (ref 12.0–15.0)
MCH: 30.8 pg (ref 26.0–34.0)
MCHC: 32.3 g/dL (ref 30.0–36.0)
MCV: 95.5 fL (ref 80.0–100.0)
Platelets: 365 10*3/uL (ref 150–400)
RBC: 3.34 MIL/uL — ABNORMAL LOW (ref 3.87–5.11)
RDW: 14 % (ref 11.5–15.5)
WBC: 6.2 10*3/uL (ref 4.0–10.5)
nRBC: 0 % (ref 0.0–0.2)

## 2021-02-02 LAB — COMPREHENSIVE METABOLIC PANEL
ALT: 13 U/L (ref 0–44)
AST: 19 U/L (ref 15–41)
Albumin: 4.1 g/dL (ref 3.5–5.0)
Alkaline Phosphatase: 99 U/L (ref 38–126)
Anion gap: 9 (ref 5–15)
BUN: 18 mg/dL (ref 8–23)
CO2: 25 mmol/L (ref 22–32)
Calcium: 9.7 mg/dL (ref 8.9–10.3)
Chloride: 107 mmol/L (ref 98–111)
Creatinine, Ser: 0.96 mg/dL (ref 0.44–1.00)
GFR, Estimated: >60 mL/min (ref >60)
Glucose, Bld: 86 mg/dL (ref 70–99)
Potassium: 3.8 mmol/L (ref 3.5–5.1)
Sodium: 141 mmol/L (ref 135–145)
Total Bilirubin: 0.5 mg/dL (ref 0.3–1.2)
Total Protein: 8.2 g/dL — ABNORMAL HIGH (ref 6.5–8.1)

## 2021-02-02 LAB — APTT: aPTT: 34 seconds (ref 24–36)

## 2021-02-02 LAB — SURGICAL PCR SCREEN
MRSA, PCR: NEGATIVE
Staphylococcus aureus: NEGATIVE

## 2021-02-02 NOTE — Progress Notes (Signed)
CBC sent to Dr. Alyson Ingles to review.

## 2021-02-03 LAB — SARS CORONAVIRUS 2 (TAT 6-24 HRS): SARS Coronavirus 2: NEGATIVE

## 2021-02-06 ENCOUNTER — Encounter (HOSPITAL_COMMUNITY)
Admission: RE | Disposition: A | Discharge status: Home / Self Care | Payer: Self-pay | Source: Home / Self Care | Attending: Urology

## 2021-02-06 ENCOUNTER — Inpatient Hospital Stay (HOSPITAL_COMMUNITY)
Admission: RE | Admit: 2021-02-06 | Discharge: 2021-02-13 | DRG: 656 | Disposition: A | Discharge status: Home / Self Care | Payer: PRIVATE HEALTH INSURANCE | Attending: Urology | Admitting: Urology

## 2021-02-06 ENCOUNTER — Inpatient Hospital Stay (HOSPITAL_COMMUNITY): Payer: PRIVATE HEALTH INSURANCE

## 2021-02-06 ENCOUNTER — Encounter (HOSPITAL_COMMUNITY): Payer: Self-pay | Admitting: Urology

## 2021-02-06 ENCOUNTER — Inpatient Hospital Stay (HOSPITAL_COMMUNITY): Payer: PRIVATE HEALTH INSURANCE | Admitting: Certified Registered"

## 2021-02-06 DIAGNOSIS — I82220 Acute embolism and thrombosis of inferior vena cava: Secondary | ICD-10-CM | POA: Diagnosis present

## 2021-02-06 DIAGNOSIS — Z853 Personal history of malignant neoplasm of breast: Secondary | ICD-10-CM

## 2021-02-06 DIAGNOSIS — Z79899 Other long term (current) drug therapy: Secondary | ICD-10-CM

## 2021-02-06 DIAGNOSIS — E785 Hyperlipidemia, unspecified: Secondary | ICD-10-CM | POA: Diagnosis present

## 2021-02-06 DIAGNOSIS — Z20822 Contact with and (suspected) exposure to covid-19: Secondary | ICD-10-CM | POA: Diagnosis present

## 2021-02-06 DIAGNOSIS — Z833 Family history of diabetes mellitus: Secondary | ICD-10-CM | POA: Diagnosis not present

## 2021-02-06 DIAGNOSIS — D649 Anemia, unspecified: Secondary | ICD-10-CM | POA: Diagnosis present

## 2021-02-06 DIAGNOSIS — E039 Hypothyroidism, unspecified: Secondary | ICD-10-CM | POA: Diagnosis present

## 2021-02-06 DIAGNOSIS — Z803 Family history of malignant neoplasm of breast: Secondary | ICD-10-CM | POA: Diagnosis not present

## 2021-02-06 DIAGNOSIS — J939 Pneumothorax, unspecified: Secondary | ICD-10-CM

## 2021-02-06 DIAGNOSIS — N2889 Other specified disorders of kidney and ureter: Secondary | ICD-10-CM | POA: Diagnosis present

## 2021-02-06 DIAGNOSIS — Z825 Family history of asthma and other chronic lower respiratory diseases: Secondary | ICD-10-CM | POA: Diagnosis not present

## 2021-02-06 DIAGNOSIS — R310 Gross hematuria: Secondary | ICD-10-CM | POA: Diagnosis present

## 2021-02-06 DIAGNOSIS — Z9012 Acquired absence of left breast and nipple: Secondary | ICD-10-CM | POA: Diagnosis not present

## 2021-02-06 DIAGNOSIS — Z87891 Personal history of nicotine dependence: Secondary | ICD-10-CM

## 2021-02-06 DIAGNOSIS — K660 Peritoneal adhesions (postprocedural) (postinfection): Secondary | ICD-10-CM | POA: Diagnosis present

## 2021-02-06 DIAGNOSIS — D492 Neoplasm of unspecified behavior of bone, soft tissue, and skin: Secondary | ICD-10-CM | POA: Diagnosis present

## 2021-02-06 DIAGNOSIS — C641 Malignant neoplasm of right kidney, except renal pelvis: Principal | ICD-10-CM | POA: Diagnosis present

## 2021-02-06 DIAGNOSIS — Z83438 Family history of other disorder of lipoprotein metabolism and other lipidemia: Secondary | ICD-10-CM | POA: Diagnosis not present

## 2021-02-06 DIAGNOSIS — Z882 Allergy status to sulfonamides status: Secondary | ICD-10-CM | POA: Diagnosis not present

## 2021-02-06 DIAGNOSIS — I10 Essential (primary) hypertension: Secondary | ICD-10-CM | POA: Diagnosis present

## 2021-02-06 DIAGNOSIS — Z91030 Bee allergy status: Secondary | ICD-10-CM

## 2021-02-06 DIAGNOSIS — Z8249 Family history of ischemic heart disease and other diseases of the circulatory system: Secondary | ICD-10-CM | POA: Diagnosis not present

## 2021-02-06 DIAGNOSIS — Z8614 Personal history of Methicillin resistant Staphylococcus aureus infection: Secondary | ICD-10-CM

## 2021-02-06 DIAGNOSIS — Z79890 Hormone replacement therapy (postmenopausal): Secondary | ICD-10-CM

## 2021-02-06 DIAGNOSIS — E119 Type 2 diabetes mellitus without complications: Secondary | ICD-10-CM | POA: Diagnosis present

## 2021-02-06 HISTORY — PX: NEPHRECTOMY: SHX65

## 2021-02-06 LAB — BASIC METABOLIC PANEL
Anion gap: 6 (ref 5–15)
BUN: 15 mg/dL (ref 8–23)
CO2: 22 mmol/L (ref 22–32)
Calcium: 7.4 mg/dL — ABNORMAL LOW (ref 8.9–10.3)
Chloride: 111 mmol/L (ref 98–111)
Creatinine, Ser: 0.96 mg/dL (ref 0.44–1.00)
GFR, Estimated: >60 mL/min (ref >60)
Glucose, Bld: 314 mg/dL — ABNORMAL HIGH (ref 70–99)
Potassium: 3.7 mmol/L (ref 3.5–5.1)
Sodium: 139 mmol/L (ref 135–145)

## 2021-02-06 LAB — CBC
HCT: 27.6 % — ABNORMAL LOW (ref 36.0–46.0)
Hemoglobin: 8.6 g/dL — ABNORMAL LOW (ref 12.0–15.0)
MCH: 27.8 pg (ref 26.0–34.0)
MCHC: 31.2 g/dL (ref 30.0–36.0)
MCV: 89.3 fL (ref 80.0–100.0)
Platelets: 238 10*3/uL (ref 150–400)
RBC: 3.09 MIL/uL — ABNORMAL LOW (ref 3.87–5.11)
RDW: 17.2 % — ABNORMAL HIGH (ref 11.5–15.5)
WBC: 13.5 10*3/uL — ABNORMAL HIGH (ref 4.0–10.5)
nRBC: 0 % (ref 0.0–0.2)

## 2021-02-06 LAB — HEMOGLOBIN AND HEMATOCRIT, BLOOD
HCT: 24.7 % — ABNORMAL LOW (ref 36.0–46.0)
Hemoglobin: 7.8 g/dL — ABNORMAL LOW (ref 12.0–15.0)

## 2021-02-06 LAB — CREATININE, SERUM
Creatinine, Ser: 1.09 mg/dL — ABNORMAL HIGH (ref 0.44–1.00)
GFR, Estimated: 58 mL/min — ABNORMAL LOW (ref >60)

## 2021-02-06 LAB — ABO/RH: ABO/RH(D): O POS

## 2021-02-06 SURGERY — NEPHRECTOMY
Anesthesia: General | Laterality: Right

## 2021-02-06 MED ORDER — FENTANYL CITRATE (PF) 100 MCG/2ML IJ SOLN
INTRAMUSCULAR | Status: AC
  Filled 2021-02-06: qty 2

## 2021-02-06 MED ORDER — DEXAMETHASONE SODIUM PHOSPHATE 10 MG/ML IJ SOLN
INTRAMUSCULAR | Status: DC | PRN
  Administered 2021-02-06: 5 mg via INTRAVENOUS

## 2021-02-06 MED ORDER — ONDANSETRON HCL 4 MG/2ML IJ SOLN
4.0000 mg | INTRAMUSCULAR | Status: DC | PRN
  Administered 2021-02-07: 4 mg via INTRAVENOUS
  Filled 2021-02-06: qty 2

## 2021-02-06 MED ORDER — CEFAZOLIN SODIUM-DEXTROSE 2-4 GM/100ML-% IV SOLN
2.0000 g | INTRAVENOUS | Status: AC
  Administered 2021-02-06: 2 g via INTRAVENOUS
  Filled 2021-02-06: qty 100

## 2021-02-06 MED ORDER — BELLADONNA ALKALOIDS-OPIUM 16.2-60 MG RE SUPP: 1.0000 | Freq: Four times a day (QID) | RECTAL | Status: DC | PRN

## 2021-02-06 MED ORDER — BUPIVACAINE LIPOSOME 1.3 % IJ SUSP
INTRAMUSCULAR | Status: DC | PRN
Start: 2021-02-06 — End: 2021-02-06
  Administered 2021-02-06: 40 mL

## 2021-02-06 MED ORDER — DIPHENHYDRAMINE HCL 12.5 MG/5ML PO ELIX: 12.5000 mg | ORAL_SOLUTION | Freq: Four times a day (QID) | ORAL | Status: DC | PRN

## 2021-02-06 MED ORDER — GLYCOPYRROLATE 0.2 MG/ML IJ SOLN
INTRAMUSCULAR | Status: DC | PRN
  Administered 2021-02-06: .1 mg via INTRAVENOUS

## 2021-02-06 MED ORDER — PROPOFOL 10 MG/ML IV BOLUS
INTRAVENOUS | Status: AC
  Filled 2021-02-06: qty 20

## 2021-02-06 MED ORDER — ROCURONIUM BROMIDE 10 MG/ML (PF) SYRINGE
PREFILLED_SYRINGE | INTRAVENOUS | Status: AC
  Filled 2021-02-06: qty 10

## 2021-02-06 MED ORDER — SODIUM CHLORIDE 0.9 % IV SOLN: INTRAVENOUS | Status: DC | PRN

## 2021-02-06 MED ORDER — ROCURONIUM BROMIDE 100 MG/10ML IV SOLN
INTRAVENOUS | Status: DC | PRN
  Administered 2021-02-06: 100 mg via INTRAVENOUS

## 2021-02-06 MED ORDER — LIDOCAINE HCL (CARDIAC) PF 100 MG/5ML IV SOSY
PREFILLED_SYRINGE | INTRAVENOUS | Status: DC | PRN
  Administered 2021-02-06: 20 mg via INTRAVENOUS
  Administered 2021-02-06: 100 mg via INTRAVENOUS

## 2021-02-06 MED ORDER — MIDAZOLAM HCL 2 MG/2ML IJ SOLN
INTRAMUSCULAR | Status: AC
  Filled 2021-02-06: qty 2

## 2021-02-06 MED ORDER — EPHEDRINE SULFATE 50 MG/ML IJ SOLN
INTRAMUSCULAR | Status: DC | PRN
  Administered 2021-02-06: 5 mg via INTRAVENOUS

## 2021-02-06 MED ORDER — SODIUM CHLORIDE (PF) 0.9 % IJ SOLN
INTRAMUSCULAR | Status: AC
  Filled 2021-02-06: qty 20

## 2021-02-06 MED ORDER — ATORVASTATIN CALCIUM 10 MG PO TABS
10.0000 mg | ORAL_TABLET | Freq: Every day | ORAL | Status: DC
  Administered 2021-02-06 – 2021-02-13 (×8): 10 mg via ORAL
  Filled 2021-02-06 (×7): qty 1

## 2021-02-06 MED ORDER — FENTANYL CITRATE (PF) 100 MCG/2ML IJ SOLN
INTRAMUSCULAR | Status: DC | PRN
  Administered 2021-02-06 (×2): 50 ug via INTRAVENOUS
  Administered 2021-02-06: 100 ug via INTRAVENOUS
  Administered 2021-02-06: 50 ug via INTRAVENOUS
  Administered 2021-02-06: 100 ug via INTRAVENOUS
  Administered 2021-02-06: 50 ug via INTRAVENOUS

## 2021-02-06 MED ORDER — KETAMINE HCL 10 MG/ML IJ SOLN
INTRAMUSCULAR | Status: AC
  Filled 2021-02-06: qty 1

## 2021-02-06 MED ORDER — METOPROLOL TARTRATE 5 MG/5ML IV SOLN
INTRAVENOUS | Status: AC
  Filled 2021-02-06: qty 5

## 2021-02-06 MED ORDER — VASOPRESSIN 20 UNIT/ML IV SOLN
INTRAVENOUS | Status: AC
  Filled 2021-02-06: qty 1

## 2021-02-06 MED ORDER — PROTAMINE SULFATE 10 MG/ML IV SOLN
INTRAVENOUS | Status: AC
  Filled 2021-02-06: qty 10

## 2021-02-06 MED ORDER — SUGAMMADEX SODIUM 500 MG/5ML IV SOLN
INTRAVENOUS | Status: DC | PRN
  Administered 2021-02-06: 341.2 mg via INTRAVENOUS

## 2021-02-06 MED ORDER — MIDAZOLAM HCL 5 MG/5ML IJ SOLN
INTRAMUSCULAR | Status: DC | PRN
  Administered 2021-02-06 (×2): 2 mg via INTRAVENOUS

## 2021-02-06 MED ORDER — DEXAMETHASONE SODIUM PHOSPHATE 10 MG/ML IJ SOLN
INTRAMUSCULAR | Status: AC
  Filled 2021-02-06: qty 1

## 2021-02-06 MED ORDER — OXYCODONE HCL 5 MG PO TABS
5.0000 mg | ORAL_TABLET | ORAL | Status: DC | PRN
  Administered 2021-02-07 – 2021-02-11 (×6): 5 mg via ORAL
  Filled 2021-02-06 (×6): qty 1

## 2021-02-06 MED ORDER — HEPARIN SODIUM (PORCINE) 1000 UNIT/ML IJ SOLN
8500.0000 [IU] | Freq: Once | INTRAMUSCULAR | Status: AC
  Administered 2021-02-06: 8500 [IU] via INTRAVENOUS
  Filled 2021-02-06: qty 8.5

## 2021-02-06 MED ORDER — ORAL CARE MOUTH RINSE
15.0000 mL | Freq: Once | OROMUCOSAL | Status: AC
  Administered 2021-02-06: 15 mL via OROMUCOSAL

## 2021-02-06 MED ORDER — ONDANSETRON HCL 4 MG/2ML IJ SOLN
INTRAMUSCULAR | Status: DC | PRN
  Administered 2021-02-06: 4 mg via INTRAVENOUS

## 2021-02-06 MED ORDER — HYDROMORPHONE HCL 1 MG/ML IJ SOLN
0.5000 mg | INTRAMUSCULAR | Status: DC | PRN
  Administered 2021-02-06 – 2021-02-07 (×3): 1 mg via INTRAVENOUS
  Administered 2021-02-07: 0.5 mg via INTRAVENOUS
  Filled 2021-02-06 (×4): qty 1

## 2021-02-06 MED ORDER — CHLORHEXIDINE GLUCONATE 4 % EX LIQD: Freq: Once | CUTANEOUS | Status: DC

## 2021-02-06 MED ORDER — ENOXAPARIN SODIUM 40 MG/0.4ML IJ SOSY
40.0000 mg | PREFILLED_SYRINGE | INTRAMUSCULAR | Status: DC
  Administered 2021-02-07 – 2021-02-13 (×7): 40 mg via SUBCUTANEOUS
  Filled 2021-02-06 (×8): qty 0.4

## 2021-02-06 MED ORDER — HYDROCODONE-ACETAMINOPHEN 5-325 MG PO TABS: 1.0000 | ORAL_TABLET | Freq: Four times a day (QID) | ORAL | 0 refills | Status: DC | PRN

## 2021-02-06 MED ORDER — LACTATED RINGERS IV SOLN: INTRAVENOUS | Status: DC | PRN

## 2021-02-06 MED ORDER — PROTAMINE SULFATE 10 MG/ML IV SOLN
85.0000 mg | Freq: Once | INTRAVENOUS | Status: AC
  Administered 2021-02-06: 45 mg via INTRAVENOUS
  Administered 2021-02-06: 30 mg via INTRAVENOUS
  Administered 2021-02-06: 10 mg via INTRAVENOUS

## 2021-02-06 MED ORDER — FENTANYL CITRATE (PF) 250 MCG/5ML IJ SOLN
INTRAMUSCULAR | Status: AC
  Filled 2021-02-06: qty 5

## 2021-02-06 MED ORDER — DIPHENHYDRAMINE HCL 50 MG/ML IJ SOLN
12.5000 mg | Freq: Four times a day (QID) | INTRAMUSCULAR | Status: DC | PRN
Start: 2021-02-06 — End: 2021-02-13

## 2021-02-06 MED ORDER — ACETAMINOPHEN 500 MG PO TABS
1000.0000 mg | ORAL_TABLET | Freq: Once | ORAL | Status: AC
  Administered 2021-02-06: 1000 mg via ORAL

## 2021-02-06 MED ORDER — DOCUSATE SODIUM 100 MG PO CAPS
100.0000 mg | ORAL_CAPSULE | Freq: Two times a day (BID) | ORAL | Status: DC
  Administered 2021-02-06 – 2021-02-13 (×14): 100 mg via ORAL
  Filled 2021-02-06 (×14): qty 1

## 2021-02-06 MED ORDER — FENTANYL CITRATE (PF) 100 MCG/2ML IJ SOLN
25.0000 ug | INTRAMUSCULAR | Status: DC | PRN
  Administered 2021-02-06: 50 ug via INTRAVENOUS

## 2021-02-06 MED ORDER — LEVOTHYROXINE SODIUM 88 MCG PO TABS
88.0000 ug | ORAL_TABLET | Freq: Every day | ORAL | Status: DC
  Administered 2021-02-07 – 2021-02-13 (×7): 88 ug via ORAL
  Filled 2021-02-06 (×7): qty 1

## 2021-02-06 MED ORDER — ESMOLOL HCL 100 MG/10ML IV SOLN
INTRAVENOUS | Status: AC
  Filled 2021-02-06: qty 10

## 2021-02-06 MED ORDER — EPINEPHRINE 1 MG/10ML IJ SOSY
PREFILLED_SYRINGE | INTRAMUSCULAR | Status: DC | PRN
  Administered 2021-02-06 (×2): .1 mg via INTRAVENOUS

## 2021-02-06 MED ORDER — LIDOCAINE 2% (20 MG/ML) 5 ML SYRINGE
INTRAMUSCULAR | Status: AC
  Filled 2021-02-06: qty 5

## 2021-02-06 MED ORDER — EPINEPHRINE 1 MG/10ML IJ SOSY
PREFILLED_SYRINGE | INTRAMUSCULAR | Status: AC
  Filled 2021-02-06: qty 10

## 2021-02-06 MED ORDER — LACTATED RINGERS IV SOLN: INTRAVENOUS | Status: DC

## 2021-02-06 MED ORDER — KETAMINE HCL 10 MG/ML IJ SOLN
INTRAMUSCULAR | Status: DC | PRN
  Administered 2021-02-06: 30 mg via INTRAVENOUS
  Administered 2021-02-06: 10 mg via INTRAVENOUS

## 2021-02-06 MED ORDER — VASOPRESSIN 20 UNIT/ML IV SOLN
INTRAVENOUS | Status: DC | PRN
  Administered 2021-02-06: 2 [IU] via INTRAVENOUS
  Administered 2021-02-06: 3 [IU] via INTRAVENOUS
  Administered 2021-02-06: 2 [IU] via INTRAVENOUS

## 2021-02-06 MED ORDER — FENTANYL CITRATE (PF) 100 MCG/2ML IJ SOLN
INTRAMUSCULAR | Status: AC
  Administered 2021-02-06: 100 ug
  Filled 2021-02-06: qty 2

## 2021-02-06 MED ORDER — AMLODIPINE BESYLATE 5 MG PO TABS
5.0000 mg | ORAL_TABLET | Freq: Every day | ORAL | Status: DC
  Administered 2021-02-07 – 2021-02-13 (×7): 5 mg via ORAL
  Filled 2021-02-06 (×7): qty 1

## 2021-02-06 MED ORDER — PROPOFOL 10 MG/ML IV BOLUS
INTRAVENOUS | Status: DC | PRN
  Administered 2021-02-06: 150 mg via INTRAVENOUS

## 2021-02-06 MED ORDER — PHENYLEPHRINE HCL-NACL 10-0.9 MG/250ML-% IV SOLN
INTRAVENOUS | Status: DC | PRN
  Administered 2021-02-06: 100 ug/min via INTRAVENOUS

## 2021-02-06 MED ORDER — ONDANSETRON HCL 4 MG/2ML IJ SOLN
INTRAMUSCULAR | Status: AC
  Filled 2021-02-06: qty 2

## 2021-02-06 MED ORDER — CHLORHEXIDINE GLUCONATE 0.12 % MT SOLN: 15.0000 mL | Freq: Once | OROMUCOSAL | Status: AC

## 2021-02-06 MED ORDER — BUPIVACAINE LIPOSOME 1.3 % IJ SUSP
20.0000 mL | Freq: Once | INTRAMUSCULAR | Status: DC
  Filled 2021-02-06: qty 20

## 2021-02-06 MED ORDER — ACETAMINOPHEN 10 MG/ML IV SOLN
1000.0000 mg | Freq: Four times a day (QID) | INTRAVENOUS | Status: AC
  Administered 2021-02-06 – 2021-02-07 (×4): 1000 mg via INTRAVENOUS
  Filled 2021-02-06 (×4): qty 100

## 2021-02-06 SURGICAL SUPPLY — 62 items
AGENT HMST KT MTR STRL THRMB (HEMOSTASIS) ×1
APL PRP STRL LF DISP 70% ISPRP (MISCELLANEOUS) ×1
APPLIER CLIP 13 LRG OPEN (CLIP) ×4
APPLIER CLIP 9.375 MED OPEN (MISCELLANEOUS)
APR CLP LRG 13 20 CLIP (CLIP) ×2
APR CLP MED 9.3 20 MLT OPN (MISCELLANEOUS)
ATTRACTOMAT 16X20 MAGNETIC DRP (DRAPES) IMPLANT
BLADE EXTENDED COATED 6.5IN (ELECTRODE) ×2 IMPLANT
CHLORAPREP W/TINT 26 (MISCELLANEOUS) ×2 IMPLANT
CLIP APPLIE 13 LRG OPEN (CLIP) IMPLANT
CLIP APPLIE 9.375 MED OPEN (MISCELLANEOUS) IMPLANT
CLIP VESOCCLUDE LG 6/CT (CLIP) ×2 IMPLANT
CLIP VESOCCLUDE MED 6/CT (CLIP) ×3 IMPLANT
CLIP VESOCCLUDE MED LG 6/CT (CLIP) ×2 IMPLANT
CLIP VESOLOCK LG 6/CT PURPLE (CLIP) ×3 IMPLANT
CLIP VESOLOCK MED LG 6/CT (CLIP) ×5 IMPLANT
COVER BACK TABLE 60X90IN (DRAPES) IMPLANT
COVER SURGICAL LIGHT HANDLE (MISCELLANEOUS) ×2 IMPLANT
COVER WAND RF STERILE (DRAPES) IMPLANT
DECANTER SPIKE VIAL GLASS SM (MISCELLANEOUS) ×2 IMPLANT
DISSECTOR ROUND CHERRY 3/8 STR (MISCELLANEOUS) ×1 IMPLANT
DRAIN CHANNEL 15F RND FF 3/16 (WOUND CARE) IMPLANT
DRAIN PENROSE 0.5X18 (DRAIN) IMPLANT
DRAPE INCISE IOBAN 66X45 STRL (DRAPES) ×2 IMPLANT
DRAPE LAPAROSCOPIC ABDOMINAL (DRAPES) ×1 IMPLANT
DRAPE LAPAROTOMY TRNSV 102X78 (DRAPES) IMPLANT
DRAPE WARM FLUID 44X44 (DRAPES) ×2 IMPLANT
ELECT REM PT RETURN 15FT ADLT (MISCELLANEOUS) ×2 IMPLANT
EVACUATOR SILICONE 100CC (DRAIN) IMPLANT
GAUZE 4X4 16PLY RFD (DISPOSABLE) ×1 IMPLANT
GAUZE SPONGE 4X4 12PLY STRL (GAUZE/BANDAGES/DRESSINGS) IMPLANT
GLOVE BIOGEL M 8.0 STRL (GLOVE) ×2 IMPLANT
GLOVE SRG 8 PF TXTR STRL LF DI (GLOVE) ×1 IMPLANT
GLOVE SURG UNDER POLY LF SZ8 (GLOVE) ×2
HEMOSTAT SURGICEL 4X8 (HEMOSTASIS) ×2 IMPLANT
KIT BASIN OR (CUSTOM PROCEDURE TRAY) ×2 IMPLANT
KIT TURNOVER KIT A (KITS) ×2 IMPLANT
LOOP VESSEL MAXI BLUE (MISCELLANEOUS) IMPLANT
NS IRRIG 1000ML POUR BTL (IV SOLUTION) ×2 IMPLANT
PACK GENERAL/GYN (CUSTOM PROCEDURE TRAY) ×2 IMPLANT
PENCIL SMOKE EVACUATOR (MISCELLANEOUS) ×1 IMPLANT
SPONGE LAP 18X18 RF (DISPOSABLE) ×3 IMPLANT
SPONGE SURGIFOAM ABS GEL 100 (HEMOSTASIS) IMPLANT
STAPLER VISISTAT 35W (STAPLE) ×1 IMPLANT
SURGIFLO W/THROMBIN 8M KIT (HEMOSTASIS) ×1 IMPLANT
SUT ETHILON 3 0 PS 1 (SUTURE) IMPLANT
SUT MON AB 2-0 SH 27 (SUTURE)
SUT MON AB 2-0 SH27 (SUTURE) IMPLANT
SUT PDS AB 1 TP1 96 (SUTURE) ×4 IMPLANT
SUT PROLENE 4 0 RB 1 (SUTURE) ×6
SUT PROLENE 4-0 RB1 .5 CRCL 36 (SUTURE) IMPLANT
SUT SILK 0 (SUTURE) ×2
SUT SILK 0 30XBRD TIE 6 (SUTURE) ×1 IMPLANT
SUT SILK 2 0 (SUTURE) ×2
SUT SILK 2-0 30XBRD TIE 12 (SUTURE) ×1 IMPLANT
SUT VIC AB 2-0 CT1 27 (SUTURE)
SUT VIC AB 2-0 CT1 27XBRD (SUTURE) ×1 IMPLANT
TAPE UMBILICAL 1/8 X36 TWILL (MISCELLANEOUS) IMPLANT
TOWEL OR 17X26 10 PK STRL BLUE (TOWEL DISPOSABLE) ×2 IMPLANT
TOWEL OR NON WOVEN STRL DISP B (DISPOSABLE) ×2 IMPLANT
TRAY FOLEY MTR SLVR 16FR STAT (SET/KITS/TRAYS/PACK) ×2 IMPLANT
TUBING CONNECTING 10 (TUBING) IMPLANT

## 2021-02-06 NOTE — Anesthesia Procedure Notes (Signed)
Performed by: Elaina Pattee, CRNA

## 2021-02-06 NOTE — Anesthesia Preprocedure Evaluation (Addendum)
Anesthesia Evaluation  Patient identified by MRN, date of birth, ID band Patient awake    Reviewed: Allergy & Precautions, NPO status , Patient's Chart, lab work & pertinent test results  Airway Mallampati: I  TM Distance: >3 FB Neck ROM: Full    Dental  (+) Edentulous Upper, Upper Dentures, Dental Advisory Given   Pulmonary neg pulmonary ROS, former smoker,    Pulmonary exam normal breath sounds clear to auscultation       Cardiovascular hypertension, Pt. on medications negative cardio ROS Normal cardiovascular exam Rhythm:Regular Rate:Normal     Neuro/Psych negative neurological ROS  negative psych ROS   GI/Hepatic negative GI ROS, Neg liver ROS,   Endo/Other  diabetesHypothyroidism   Renal/GU negative Renal ROS  negative genitourinary   Musculoskeletal negative musculoskeletal ROS (+)   Abdominal   Peds  Hematology  (+) Blood dyscrasia (Hgb 10.3), anemia ,   Anesthesia Other Findings Right renal mass  Reproductive/Obstetrics                           Anesthesia Physical Anesthesia Plan  ASA: II  Anesthesia Plan: General   Post-op Pain Management:    Induction: Intravenous  PONV Risk Score and Plan: 3 and Midazolam, Dexamethasone and Ondansetron  Airway Management Planned: Oral ETT  Additional Equipment: Arterial line  Intra-op Plan:   Post-operative Plan: Extubation in OR  Informed Consent: I have reviewed the patients History and Physical, chart, labs and discussed the procedure including the risks, benefits and alternatives for the proposed anesthesia with the patient or authorized representative who has indicated his/her understanding and acceptance.     Dental advisory given  Plan Discussed with: CRNA  Anesthesia Plan Comments: (2 IVs, ketamine as multimodal analgesia)       Anesthesia Quick Evaluation

## 2021-02-06 NOTE — Anesthesia Procedure Notes (Signed)
Arterial Line Insertion Start/End5/12/2020 1:35 PM Performed by: Freddrick March, MD, anesthesiologist  Preanesthetic checklist: patient identified, IV checked, site marked, risks and benefits discussed, surgical consent, monitors and equipment checked, pre-op evaluation, timeout performed and anesthesia consent Catheter size: 20 G Hand hygiene performed , maximum sterile barriers used  and Seldinger technique used Allen's test indicative of satisfactory collateral circulation Attempts: 2 Procedure performed without using ultrasound guided technique. Following insertion, dressing applied and Biopatch. Post procedure assessment: normal  Patient tolerated the procedure well with no immediate complications.

## 2021-02-06 NOTE — Discharge Instructions (Signed)
1.  Activity:  You are encouraged to ambulate frequently (about every hour during waking hours) to help prevent blood clots from forming in your legs or lungs.  However, you should not engage in any heavy lifting (> 10-15 lbs), strenuous activity, or straining. 2. Diet: You should advance your diet as instructed by your physician.  It will be normal to have some bloating, nausea, and abdominal discomfort intermittently. 3. Prescriptions:  You will be provided a prescription for pain medication to take as needed.  If your pain is not severe enough to require the prescription pain medication, you may take extra strength Tylenol instead which will have less side effects.  You should also take a prescribed stool softener to avoid straining with bowel movements as the prescription pain medication may constipate you. 4. Incisions: You may remove your dressing bandages 48 hours after surgery if not removed in the hospital.  You will either have some small staples or special tissue glue at each of the incision sites. Once the bandages are removed (if present), the incisions may stay open to air.  You may start showering (but not soaking or bathing in water) the 2nd day after surgery and the incisions simply need to be patted dry after the shower.  No additional care is needed. 5. What to call us about: You should call the office [phone]) if you develop fever > 101 or develop persistent vomiting.   You may resume aspirin, advil, aleve, vitmains, and supplements 7 days after surgery.

## 2021-02-06 NOTE — Op Note (Signed)
Preoperative diagnosis: Right renal mass  Postop diagnosis: Right renal mass, IVC thrombus  Procedure: 1.  right open radical nephrectomy and IVC thrombectomy 2.  IVC resection  Attending: Nicolette Bang, MD  Assistant: Debbrah Alar, PA  Anesthesia: General  Estimated blood loss: 4000 cc  Drains: 16 French Foley catheter  Specimens: right radical nephrectomy, renal vein with tumor thrombus, IVC at junction of right renal vein  Antibiotics: ancef  Findings: 1 renal artery, 1 renal vein. Tumor thrombus extended 2cm into IVC and partially invaded wall of IVC. The assistant was utilized for retraction, suction, applying left renal vein clamp.  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  The mass was not amenable to partial nephrectomy.  After discussing treatment options patient decided to proceed with right robot assisted laparoscopic radical nephrectomy.  Procedure in detail: Prior to procedure consent was obtained. Patient was brought to the operating room and briefing was done sure correct patient, correct procedure, correct site.  General anesthesia was in administered patient was placed in the left lateral decubitus position.  a 42 French catheter was placed. their abdomen  was then prepped and draped usual sterile fashion. We made a laparotomy incision and entered the peritoneal cavity. We then started this dissection by removing a extensive amount of anterior abdominal wall adhesions and adhesions to the liver.  We then dissected along the white line of Toldt.  We then reflected the colon medially.  We then proceeded to kocherize the duodenum. We then identified the psoas muscle.  Once this was done we traced it down to the iliac vessels and identified the ureter.  Once we identified the gonadal vein and ureter were then traced this to the renal hilum.  The renal vein and renal artery were skeletonized.  We did we identified  one renal vein one renal artery. We then ligated the renal artery with a 0 silk tie and 3 metal clips. He then sharply incised the renal artery. We noted tumor thrombus extending into the IVC and invading the wall of the IVC. We skeletonized the IVC above and below the right renal vein and then we skeletonized the left renal vein. We then gave 8500 units of heparin prior to clamping left renal vein and clamping the IVC. We clamped both the left renal vein and the INV below the right renal vein. Using a 15 blade scalpel the IVC was opened at the level of the right renal vein. The IVC thrombus was identified and removed from the wall of the IVC using sharp dissection.The remainder of the renal vein was ligated. The defect in the IVC was closed with 4-0 prolene in a running fashion. Once this was complete we unclamped the left renal vein and the IVC and noted good hemostasis over the IVC. We then freed the kidney from its posterior, lateral and inferior attachments using blunt dissection. The upper pole was densely adherent to the liver and was freed from the liver using electrocautery. The We then placed floseal and surgicel over the posterior aspect of the liver where the tumor was adherent to the liver.  We then made a right lower quardant incision to place a drain. We then closed the fascia with 0 PDS in a running fashion. We then reapproximated the subcutaneous tissue with 2-0 vicryl in a running fashion. We then closed the skin with staples. A dressing with placed over the drain site and over the incision.  This then concluded the procedure which was tolerated by the patient.  Complications: None  Condition: Stable, extubated, transferred to PACU.  Plan: Patient is to be admitted to the ICU. They will be started on a clear liquid diet POD#1

## 2021-02-06 NOTE — Transfer of Care (Signed)
Immediate Anesthesia Transfer of Care Note  Patient: Deanna Bush  Procedure(s) Performed: NEPHRECTOMY- open radical (Right )  Patient Location: PACU  Anesthesia Type:General  Level of Consciousness: drowsy and patient cooperative  Airway & Oxygen Therapy: Patient Spontanous Breathing and Patient connected to face mask oxygen  Post-op Assessment: Report given to RN and Post -op Vital signs reviewed and stable  Post vital signs: Reviewed and stable  Last Vitals:  Vitals Value Taken Time  BP 120/67 02/06/21 1821  Temp    Pulse 69 02/06/21 1827  Resp 18 02/06/21 1827  SpO2 98 % 02/06/21 1827  Vitals shown include unvalidated device data.  Last Pain:  Vitals:   02/06/21 1103  TempSrc: Oral         Complications: No complications documented.

## 2021-02-06 NOTE — Anesthesia Postprocedure Evaluation (Signed)
Anesthesia Post Note  Patient: Deanna Bush  Procedure(s) Performed: NEPHRECTOMY- open radical (Right )     Patient location during evaluation: PACU Anesthesia Type: General Level of consciousness: awake and alert Pain management: pain level controlled Vital Signs Assessment: post-procedure vital signs reviewed and stable Respiratory status: spontaneous breathing, nonlabored ventilation and respiratory function stable Cardiovascular status: blood pressure returned to baseline and stable Postop Assessment: no apparent nausea or vomiting Anesthetic complications: no   No complications documented.  Last Vitals:  Vitals:   02/06/21 1845 02/06/21 1900  BP: (!) 108/53 97/62  Pulse: 70 62  Resp: 17 (!) 23  Temp:    SpO2: 100% 100%    Last Pain:  Vitals:   02/06/21 1845  TempSrc:   PainSc: Asleep                 Lynda Rainwater

## 2021-02-06 NOTE — Anesthesia Procedure Notes (Signed)
Procedure Name: Intubation Date/Time: 02/06/2021 1:18 PM Performed by: Elaina Pattee, CRNA Pre-anesthesia Checklist: Patient identified, Emergency Drugs available, Suction available and Patient being monitored Patient Re-evaluated:Patient Re-evaluated prior to induction Oxygen Delivery Method: Circle system utilized Preoxygenation: Pre-oxygenation with 100% oxygen Induction Type: IV induction Ventilation: Mask ventilation without difficulty Laryngoscope Size: Mac and 3 Grade View: Grade I Tube type: Oral Tube size: 7.5 mm Number of attempts: 1 Airway Equipment and Method: Stylet and Oral airway Placement Confirmation: ETT inserted through vocal cords under direct vision,  positive ETCO2 and breath sounds checked- equal and bilateral Tube secured with: Tape Dental Injury: Teeth and Oropharynx as per pre-operative assessment

## 2021-02-06 NOTE — H&P (Signed)
Urology Admission H&P  Chief Complaint: right renal mass  History of Present Illness: Deanna Bush is a 60yo here for right radical nephrectomy. She has a 13cm right renal mass with renal vein involvement.  HGB 10.3. Calcium 9.8. She has been having gross hematuria for 2 months. No other complaints today  Past Medical History:  Diagnosis Date  . 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 Cellulitis of leg, left   . Complication of anesthesia    Hard to wake up  . Diabetes mellitus without complication ([place])    Pt denies  . Family history of breast cancer 08/16/2011  . Hyperlipidemia   . Hypertension   . Hypothyroidism   . MRSA (methicillin resistant staph aureus) culture positive 08/19/2011  . Pre-diabetes    Past Surgical History:  Procedure Laterality Date  . BREAST SURGERY Left    mastectomy  . CESAREAN SECTION     x2  . COLONOSCOPY N/A 03/03/2020   Procedure: COLONOSCOPY;  Surgeon: Daneil Dolin, MD;  [facility];  Service: Endoscopy;  Laterality: N/A;  12:00  . left mastectomy    . POLYPECTOMY  03/03/2020   Procedure: POLYPECTOMY;  Surgeon: Daneil Dolin, MD;  Location: [facility];  Service: Endoscopy;;    Home Medications:  Current Facility-Administered Medications  Medication Dose Route Frequency Provider Last Rate Last Admin  . ceFAZolin (ANCEF) IVPB 2g/100 mL premix  2 g Intravenous 30 min Pre-Op Niharika Savino, Candee Furbish, MD      . chlorhexidine (HIBICLENS) 4 % liquid   Topical Once Porsche Noguchi, Candee Furbish, MD      . lactated ringers infusion   Intravenous Continuous Merlinda Frederick, MD 10 mL/hr at 02/06/21 1125 New Bag at 02/06/21 1125   Allergies:  Allergies  Allergen Reactions  . Sulfonamide Derivatives Itching  . [place] Venom [Bee Venom] Rash    urticaria    Family History  Problem Relation Age of Onset  . Hypertension Mother   . Diabetes Mother   . Hyperlipidemia Mother   . COPD Brother   . Cancer Sister        breast  . Cancer Sister         breast   Social History:  reports that she quit smoking about 18 years ago. Her smoking use included cigarettes. She has a 9.00 pack-year smoking history. She has never used smokeless tobacco. She reports that she does not drink alcohol and does not use drugs.  Review of Systems  Genitourinary: Positive for hematuria.  All other systems reviewed and are negative.   Physical Exam:  Vital signs in last 24 hours: Temp:  [98.4 F (36.9 C)] 98.4 F (36.9 C) (05/03 1103) Pulse Rate:  [74] 74 (05/03 1103) Resp:  [16] 16 (05/03 1103) BP: (157)/(75) 157/75 (05/03 1103) SpO2:  [99 %] 99 % (05/03 1103) Physical Exam Vitals reviewed.  Constitutional:      Appearance: Normal appearance.  HENT:     Head: Normocephalic and atraumatic.     Mouth/Throat:     Mouth: Mucous membranes are dry.  Eyes:     Extraocular Movements: Extraocular movements intact.     Pupils: Pupils are equal, round, and reactive to light.  Cardiovascular:     Rate and Rhythm: Normal rate and regular rhythm.  Pulmonary:     Effort: Pulmonary effort is normal. No respiratory distress.  Abdominal:     General: Abdomen is flat. There is no distension.  Musculoskeletal:        General: No swelling. Normal range of motion.     Cervical back: Normal range of motion and neck supple.  Skin:    General: Skin is warm and dry.  Neurological:     General: No focal deficit present.     Mental Status: She is alert and oriented to person, place, and time.  Psychiatric:        Mood and Affect: Mood normal.        Behavior: Behavior normal.        Thought Content: Thought content normal.        Judgment: Judgment normal.     Laboratory Data:  Results for orders placed or performed during the hospital encounter of 02/06/21 (from the past 24 hour(s))  ABO/Rh     Status: None   Collection Time: 02/06/21 11:16 AM  Result Value Ref Range   ABO/RH(D)      O POS Performed at [facility], [place]  [address] [place], [place] [address]    Recent Results (from the past 240 hour(s))  Surgical pcr screen     Status: None   Collection Time: 02/02/21  1:31 PM   Specimen: Nasal Mucosa; Nasal Swab  Result Value Ref Range Status   MRSA, PCR NEGATIVE NEGATIVE Final   Staphylococcus aureus NEGATIVE NEGATIVE Final    Comment: (NOTE) The Xpert SA Assay (FDA approved for NASAL specimens in patients 66 years of age and older), is one component of a comprehensive surveillance program. It is not intended to diagnose infection nor to guide or monitor treatment. Performed at [facility], [place] [address] [place], [place] [address]   SARS CORONAVIRUS 2 (TAT 6-24 HRS) Nasopharyngeal Nasopharyngeal Swab     Status: None   Collection Time: 02/02/21  2:21 PM   Specimen: Nasopharyngeal Swab  Result Value Ref Range Status   SARS Coronavirus 2 NEGATIVE NEGATIVE Final    Comment: (NOTE) SARS-CoV-2 target nucleic acids are NOT DETECTED.  The SARS-CoV-2 RNA is generally detectable in upper and lower respiratory specimens during the acute phase of infection. Negative results do not preclude SARS-CoV-2 infection, do not rule out co-infections with other pathogens, and should not be used as the sole basis for treatment or other patient management decisions. Negative results must be combined with clinical observations, patient history, and epidemiological information. The expected result is Negative.  Fact Sheet for Patients: SugarRoll.be  Fact Sheet for Healthcare Providers: https://www.woods-mathews.com/  This test is not yet approved or cleared by the [place] FDA and  has been authorized for detection and/or diagnosis of SARS-CoV-2 by FDA under an Emergency Use Authorization (EUA). This EUA will remain  in effect (meaning this test can be used) for the duration of the COVID-19 declaration under Se ction 564(b)(1) of the Act, 21  U.S.C. section 360bbb-3(b)(1), unless the authorization is terminated or revoked sooner.  Performed at [facility], [place] [address], [place], [place] [address]    Creatinine: Recent Labs    02/02/21 1331  CREATININE 0.96   Baseline Creatinine: 0.96  Impression/Assessment:  59yo with right renal mass  Plan:  The risks/benefits/alternatives to right open radical nephrectomy was explained to the patient and she understands and wishes to proceed with surgery  Nicolette Bang 02/06/2021, 12:50 PM

## 2021-02-07 ENCOUNTER — Other Ambulatory Visit: Payer: Self-pay

## 2021-02-07 ENCOUNTER — Encounter (HOSPITAL_COMMUNITY): Payer: Self-pay | Admitting: Urology

## 2021-02-07 LAB — POCT I-STAT 7, (LYTES, BLD GAS, ICA,H+H)
Acid-Base Excess: 0 mmol/L (ref 0.0–2.0)
Acid-base deficit: 1 mmol/L (ref 0.0–2.0)
Acid-base deficit: 2 mmol/L (ref 0.0–2.0)
Acid-base deficit: 4 mmol/L — ABNORMAL HIGH (ref 0.0–2.0)
Bicarbonate: 24.8 mmol/L (ref 20.0–28.0)
Bicarbonate: 24.6 mmol/L (ref 20.0–28.0)
Bicarbonate: 23 mmol/L (ref 20.0–28.0)
Bicarbonate: 23.4 mmol/L (ref 20.0–28.0)
Calcium, Ion: 1.18 mmol/L (ref 1.15–1.40)
Calcium, Ion: 1.18 mmol/L (ref 1.15–1.40)
Calcium, Ion: 1.07 mmol/L — ABNORMAL LOW (ref 1.15–1.40)
Calcium, Ion: 1.08 mmol/L — ABNORMAL LOW (ref 1.15–1.40)
HCT: 25 % — ABNORMAL LOW (ref 36.0–46.0)
HCT: 25 % — ABNORMAL LOW (ref 36.0–46.0)
HCT: 23 % — ABNORMAL LOW (ref 36.0–46.0)
HCT: 24 % — ABNORMAL LOW (ref 36.0–46.0)
Hemoglobin: 8.5 g/dL — ABNORMAL LOW (ref 12.0–15.0)
Hemoglobin: 8.5 g/dL — ABNORMAL LOW (ref 12.0–15.0)
Hemoglobin: 7.8 g/dL — ABNORMAL LOW (ref 12.0–15.0)
Hemoglobin: 8.2 g/dL — ABNORMAL LOW (ref 12.0–15.0)
O2 Saturation: 100 %
O2 Saturation: 99 %
O2 Saturation: 100 %
O2 Saturation: 100 %
Patient temperature: 34
Patient temperature: 34
Patient temperature: 34.2
Patient temperature: 34.2
Potassium: 3.5 mmol/L (ref 3.5–5.1)
Potassium: 3.8 mmol/L (ref 3.5–5.1)
Potassium: 3.6 mmol/L (ref 3.5–5.1)
Potassium: 3.6 mmol/L (ref 3.5–5.1)
Sodium: 138 mmol/L (ref 135–145)
Sodium: 137 mmol/L (ref 135–145)
Sodium: 138 mmol/L (ref 135–145)
Sodium: 139 mmol/L (ref 135–145)
TCO2: 26 mmol/L (ref 22–32)
TCO2: 26 mmol/L (ref 22–32)
TCO2: 25 mmol/L (ref 22–32)
TCO2: 25 mmol/L (ref 22–32)
pCO2 arterial: 34.7 mmHg (ref 32.0–48.0)
pCO2 arterial: 37.2 mmHg (ref 32.0–48.0)
pCO2 arterial: 36.6 mmHg (ref 32.0–48.0)
pCO2 arterial: 46 mmHg (ref 32.0–48.0)
pH, Arterial: 7.449 (ref 7.350–7.450)
pH, Arterial: 7.416 (ref 7.350–7.450)
pH, Arterial: 7.292 — ABNORMAL LOW (ref 7.350–7.450)
pH, Arterial: 7.401 (ref 7.350–7.450)
pO2, Arterial: 212 mmHg — ABNORMAL HIGH (ref 83.0–108.0)
pO2, Arterial: 121 mmHg — ABNORMAL HIGH (ref 83.0–108.0)
pO2, Arterial: 206 mmHg — ABNORMAL HIGH (ref 83.0–108.0)
pO2, Arterial: 224 mmHg — ABNORMAL HIGH (ref 83.0–108.0)

## 2021-02-07 LAB — BASIC METABOLIC PANEL
Anion gap: 5 (ref 5–15)
BUN: 20 mg/dL (ref 8–23)
CO2: 24 mmol/L (ref 22–32)
Calcium: 8.3 mg/dL — ABNORMAL LOW (ref 8.9–10.3)
Chloride: 110 mmol/L (ref 98–111)
Creatinine, Ser: 1.26 mg/dL — ABNORMAL HIGH (ref 0.44–1.00)
GFR, Estimated: 49 mL/min — ABNORMAL LOW (ref >60)
Glucose, Bld: 239 mg/dL — ABNORMAL HIGH (ref 70–99)
Potassium: 4.9 mmol/L (ref 3.5–5.1)
Sodium: 139 mmol/L (ref 135–145)

## 2021-02-07 LAB — BPAM FFP
Blood Product Expiration Date: 202205082359
Blood Product Expiration Date: 202205082359
ISSUE DATE / TIME: 202205031743
ISSUE DATE / TIME: 202205031743
Unit Type and Rh: 5100
Unit Type and Rh: 5100

## 2021-02-07 LAB — PREPARE FRESH FROZEN PLASMA
Unit division: 0
Unit division: 0

## 2021-02-07 LAB — GLUCOSE, CAPILLARY: Glucose-Capillary: 163 mg/dL — ABNORMAL HIGH (ref 70–99)

## 2021-02-07 LAB — HEMOGLOBIN AND HEMATOCRIT, BLOOD
HCT: 27.9 % — ABNORMAL LOW (ref 36.0–46.0)
Hemoglobin: 8.8 g/dL — ABNORMAL LOW (ref 12.0–15.0)

## 2021-02-07 MED ORDER — DEXTROSE-NACL 5-0.45 % IV SOLN: INTRAVENOUS | Status: DC

## 2021-02-07 MED ORDER — ACETAMINOPHEN 325 MG PO TABS
650.0000 mg | ORAL_TABLET | Freq: Four times a day (QID) | ORAL | Status: DC | PRN
  Administered 2021-02-08 – 2021-02-11 (×7): 650 mg via ORAL
  Filled 2021-02-07 (×8): qty 2

## 2021-02-07 NOTE — Progress Notes (Signed)
1 Day Post-Op Subjective: Patient reports good pain control. Hgb 8.8. JP 45cc. Negative flatus.   Objective: Vital signs in last 24 hours: Temp:  [97.5 F (36.4 C)-98.6 F (37 C)] 97.7 F (36.5 C) (05/04 0800) Pulse Rate:  [57-77] 66 (05/04 1200) Resp:  [11-23] 17 (05/04 1200) BP: (92-145)/(52-67) 145/55 (05/04 1200) SpO2:  [92 %-100 %] 92 % (05/04 1200) Arterial Line BP: (80-124)/(45-86) 92/74 (05/04 0800)  Intake/Output from previous day: 05/03 0701 - 05/04 0700 In: 7209.2 [I.V.:4800; Blood:1760; IV Piggyback:99.2] Out: 5195 [Urine:1150; Drains:45; Blood:4000] Intake/Output this shift: Total I/O In: 1065.1 [P.O.:600; I.V.:378.6; IV Piggyback:86.4] Out: 160 [Urine:150; Drains:10]  Physical Exam:  General:alert, cooperative and appears stated age GI: soft, non tender, normal bowel sounds, no palpable masses, no organomegaly, no inguinal hernia Female genitalia: not done Extremities: extremities normal, atraumatic, no cyanosis or edema  Lab Results: Recent Labs    02/06/21 1833 02/06/21 2105 02/07/21 0230  HGB 7.8* 8.6* 8.8*  HCT 24.7* 27.6* 27.9*   BMET Recent Labs    02/06/21 1833 02/06/21 2105 02/07/21 0230  NA 139  --  139  K 3.7  --  4.9  CL 111  --  110  CO2 22  --  24  GLUCOSE 314*  --  239*  BUN 15  --  20  CREATININE 0.96 1.09* 1.26*  CALCIUM 7.4*  --  8.3*   No results for input(s): LABPT, INR in the last 72 hours. No results for input(s): LABURIN in the last 72 hours. Results for orders placed or performed during the hospital encounter of 02/02/21  SARS CORONAVIRUS 2 (TAT 6-24 HRS) Nasopharyngeal Nasopharyngeal Swab     Status: None   Collection Time: 02/02/21  2:21 PM   Specimen: Nasopharyngeal Swab  Result Value Ref Range Status   SARS Coronavirus 2 NEGATIVE NEGATIVE Final    Comment: (NOTE) SARS-CoV-2 target nucleic acids are NOT DETECTED.  The SARS-CoV-2 RNA is generally detectable in upper and lower respiratory specimens during the  acute phase of infection. Negative results do not preclude SARS-CoV-2 infection, do not rule out co-infections with other pathogens, and should not be used as the sole basis for treatment or other patient management decisions. Negative results must be combined with clinical observations, patient history, and epidemiological information. The expected result is Negative.  Fact Sheet for Patients: SugarRoll.be  Fact Sheet for Healthcare Providers: https://www.woods-mathews.com/  This test is not yet approved or cleared by the [place] FDA and  has been authorized for detection and/or diagnosis of SARS-CoV-2 by FDA under an Emergency Use Authorization (EUA). This EUA will remain  in effect (meaning this test can be used) for the duration of the COVID-19 declaration under Se ction 564(b)(1) of the Act, 21 U.S.C. section 360bbb-3(b)(1), unless the authorization is terminated or revoked sooner.  Performed at [facility], [place] [address], [place], [place] [address]     Studies/Results: [place] CHEST PORT 1 VIEW  Result Date: 02/06/2021 CLINICAL DATA:  Status post nephrectomy EXAM: PORTABLE CHEST 1 VIEW COMPARISON:  04/25/2010, MRI 01/02/2021 FINDINGS: Surgical clips in the left axilla. No focal opacity or pleural effusion. Normal cardiomediastinal silhouette with aortic atherosclerosis. No pneumothorax. Gas in the left upper quadrant probably due to air distended bowel versus gas related to recent surgery. IMPRESSION: No active disease. Electronically Signed   By: Donavan Foil M.D.   On: 02/06/2021 20:55    Assessment/Plan: POD#1 right radical nephrectomy  1. Continue ICU care, arterial line removed 2.  Continue current pain control regiment 3. Continue clear liquid diet 4. Lovenox q 12 hours   LOS: 1 day   Nicolette Bang 02/07/2021, 1:26 PM

## 2021-02-07 NOTE — Plan of Care (Signed)
Plan of care discussed with the patient and family . Pt AND FAMILY EXPRESSED AN UNDERSTANDING  and a few questions asked and answered

## 2021-02-07 NOTE — Progress Notes (Signed)
Received pt from PACU and pt is A/O X3 WITH OUT signs of distress or discomfort. Noted honey comb dressing in place without shadowing noted. JP compressed and noted to be in place with bloody drainage present. Pt reports pain 5/10 at present in her back. Line present with wave form very dampened and no correlation with cuff pressure. On going assessment

## 2021-02-07 NOTE — Progress Notes (Signed)
Chaplain engaged in an initial visit with Deanna Bush and her husband, Monica Martinez.  Chaplain offered support and prayer with them.  Chaplain will follow-up.    02/07/21 1200  Clinical Encounter Type  Visited With Patient and family together  Visit Type Initial;Spiritual support  Spiritual Encounters  Spiritual Needs Prayer

## 2021-02-08 LAB — HEMOGLOBIN AND HEMATOCRIT, BLOOD
HCT: 24.1 % — ABNORMAL LOW (ref 36.0–46.0)
Hemoglobin: 8 g/dL — ABNORMAL LOW (ref 12.0–15.0)

## 2021-02-08 LAB — BASIC METABOLIC PANEL
Anion gap: 8 (ref 5–15)
BUN: 25 mg/dL — ABNORMAL HIGH (ref 8–23)
CO2: 22 mmol/L (ref 22–32)
Calcium: 8.4 mg/dL — ABNORMAL LOW (ref 8.9–10.3)
Chloride: 101 mmol/L (ref 98–111)
Creatinine, Ser: 1.63 mg/dL — ABNORMAL HIGH (ref 0.44–1.00)
GFR, Estimated: 36 mL/min — ABNORMAL LOW (ref >60)
Glucose, Bld: 166 mg/dL — ABNORMAL HIGH (ref 70–99)
Potassium: 5 mmol/L (ref 3.5–5.1)
Sodium: 131 mmol/L — ABNORMAL LOW (ref 135–145)

## 2021-02-08 LAB — SURGICAL PATHOLOGY

## 2021-02-08 MED ORDER — BISACODYL 10 MG RE SUPP
10.0000 mg | Freq: Once | RECTAL | Status: AC
  Administered 2021-02-08: 10 mg via RECTAL
  Filled 2021-02-08: qty 1

## 2021-02-08 NOTE — TOC Progression Note (Signed)
Transition of Care [facility]) - Progression Note    Patient Details  Name: Deanna Bush MRN: [id number] Date of Birth: 27-May-1961  [facility] CM/SW Contact  Leeroy Cha, RN Phone Number: 02/08/2021, 7:52 AM  Clinical Narrative:    1 Day Post-Op Subjective: Patient reports good pain control. Hgb 8.8. JP 45cc. Negative flatus.   Objective: Vital signs in last 24 hours: Temp:  [97.5 F (36.4 C)-98.6 F (37 C)] 97.7 F (36.5 C) (05/04 0800) Pulse Rate:  [57-77] 66 (05/04 1200) Resp:  [11-23] 17 (05/04 1200) BP: (92-145)/(52-67) 145/55 (05/04 1200) SpO2:  [92 %-100 %] 92 % (05/04 1200) Arterial Line BP: (80-124)/(45-86) 92/74 (05/04 0800)  Intake/Output from previous day: 05/03 0701 - 05/04 0700 In: 7209.2 [I.V.:4800; Blood:1760; IV Piggyback:99.2] Out: 5195 [Urine:1150; Drains:45; Blood:4000] Intake/Output this shift: Total I/O In: 1065.1 [P.O.:600; I.V.:378.6; IV Piggyback:86.4] Out: 160 [Urine:150; Drains:10]  Physical Exam:  General:alert, cooperative and appears stated age GI: soft, non tender, normal bowel sounds, no palpable masses, no organomegaly, no inguinal hernia Female genitalia: not done Extremities: extremities normal, atraumatic, no cyanosis or edema PLAN: to return to home when stable with self care.  No hhc needs at this time.will follow for progression.   Expected Discharge Plan: Home/Self Care    Expected Discharge Plan and Services Expected Discharge Plan: Home/Self Care                                               Social Determinants of Health (SDOH) Interventions    Readmission Risk Interventions Readmission Risk Prevention Plan 02/07/2021  Medication Screening Complete  Transportation Screening Complete  Some recent data might be hidden

## 2021-02-08 NOTE — Progress Notes (Addendum)
2 Days Post-Op Subjective: Patient is doing well.  Denies N/V.  Good pain control.  No flatus yet.  Sat up on edge of bed earlier today. Has not been given an IS. Good UO and small volume JP output.   Objective: Vital signs in last 24 hours: Temp:  [97.8 F (36.6 C)-98.7 F (37.1 C)] 98.4 F (36.9 C) (05/05 0800) Pulse Rate:  [61-85] 85 (05/05 1000) Resp:  [10-31] 17 (05/05 1000) BP: (108-186)/(55-83) 156/72 (05/05 1000) SpO2:  [86 %-99 %] 92 % (05/05 1000)  Intake/Output from previous day: 05/04 0701 - 05/05 0700 In: 3391.3 [P.O.:1080; I.V.:2131.8; IV Piggyback:179.5] Out: 1467 [Urine:1425; Drains:42] Intake/Output this shift: Total I/O In: 399.9 [I.V.:399.9] Out: 800 [Urine:800]  Physical Exam:  General:alert, cooperative and no distress Cardiovascular: RRR Lungs: Faint crackles at bases GI: soft, appropriately tender, no bowel sounds Incisions: dressing C/D/I Urine: clear Extremities:no edema  Lab Results: Recent Labs    02/06/21 2105 02/07/21 0230 02/08/21 0259  HGB 8.6* 8.8* 8.0*  HCT 27.6* 27.9* 24.1*   BMET Recent Labs    02/07/21 0230 02/08/21 0259  NA 139 131*  K 4.9 5.0  CL 110 101  CO2 24 22  GLUCOSE 239* 166*  BUN 20 25*  CREATININE 1.26* 1.63*  CALCIUM 8.3* 8.4*   No results for input(s): LABPT, INR in the last 72 hours. No results for input(s): LABURIN in the last 72 hours. Results for orders placed or performed during the hospital encounter of 02/02/21  SARS CORONAVIRUS 2 (TAT 6-24 HRS) Nasopharyngeal Nasopharyngeal Swab     Status: None   Collection Time: 02/02/21  2:21 PM   Specimen: Nasopharyngeal Swab  Result Value Ref Range Status   SARS Coronavirus 2 NEGATIVE NEGATIVE Final    Comment: (NOTE) SARS-CoV-2 target nucleic acids are NOT DETECTED.  The SARS-CoV-2 RNA is generally detectable in upper and lower respiratory specimens during the acute phase of infection. Negative results do not preclude SARS-CoV-2 infection, do not rule  out co-infections with other pathogens, and should not be used as the sole basis for treatment or other patient management decisions. Negative results must be combined with clinical observations, patient history, and epidemiological information. The expected result is Negative.  Fact Sheet for Patients: SugarRoll.be  Fact Sheet for Healthcare Providers: https://www.woods-mathews.com/  This test is not yet approved or cleared by the [place] FDA and  has been authorized for detection and/or diagnosis of SARS-CoV-2 by FDA under an Emergency Use Authorization (EUA). This EUA will remain  in effect (meaning this test can be used) for the duration of the COVID-19 declaration under Se ction 564(b)(1) of the Act, 21 U.S.C. section 360bbb-3(b)(1), unless the authorization is terminated or revoked sooner.  Performed at [facility], [place] [address], [place], [place] [address]     Studies/Results: [place] CHEST PORT 1 VIEW  Result Date: 02/06/2021 CLINICAL DATA:  Status post nephrectomy EXAM: PORTABLE CHEST 1 VIEW COMPARISON:  04/25/2010, MRI 01/02/2021 FINDINGS: Surgical clips in the left axilla. No focal opacity or pleural effusion. Normal cardiomediastinal silhouette with aortic atherosclerosis. No pneumothorax. Gas in the left upper quadrant probably due to air distended bowel versus gas related to recent surgery. IMPRESSION: No active disease. Electronically Signed   By: Donavan Foil M.D.   On: 02/06/2021 20:55    Assessment/Plan: 2 Days Post-Op, Procedure(s) (LRB): NEPHRECTOMY- open radical (Right)   Pt is stable and making progress. Transfer to floor and d/c cardiac monitoring.    Hg slightly down  today.  Pt tolerating and vitals stable.  Continue to monitor. Slight rise in Cr which is expected.  Should plateau soon.  Continue IV hydration and monitor. Dulcolax supp to encourage return of bowel function.  Continue clears until flatus.   Continue foley until pt is more mobile.  Ambulate, Incentive spirometry DVT prophylaxis Transition to PO pain medications D/C pelvic drain   LOS: 2 days   Debbrah Alar 02/08/2021, 10:52 AM

## 2021-02-09 LAB — BASIC METABOLIC PANEL
Anion gap: 7 (ref 5–15)
BUN: 15 mg/dL (ref 8–23)
CO2: 23 mmol/L (ref 22–32)
Calcium: 8.2 mg/dL — ABNORMAL LOW (ref 8.9–10.3)
Chloride: 108 mmol/L (ref 98–111)
Creatinine, Ser: 1.09 mg/dL — ABNORMAL HIGH (ref 0.44–1.00)
GFR, Estimated: 58 mL/min — ABNORMAL LOW (ref >60)
Glucose, Bld: 114 mg/dL — ABNORMAL HIGH (ref 70–99)
Potassium: 4.1 mmol/L (ref 3.5–5.1)
Sodium: 138 mmol/L (ref 135–145)

## 2021-02-09 LAB — HEMOGLOBIN AND HEMATOCRIT, BLOOD
HCT: 20 % — ABNORMAL LOW (ref 36.0–46.0)
HCT: 25.4 % — ABNORMAL LOW (ref 36.0–46.0)
Hemoglobin: 6.3 g/dL — CL (ref 12.0–15.0)
Hemoglobin: 8 g/dL — ABNORMAL LOW (ref 12.0–15.0)

## 2021-02-09 LAB — PREPARE RBC (CROSSMATCH)

## 2021-02-09 MED ORDER — BISACODYL 10 MG RE SUPP
10.0000 mg | Freq: Once | RECTAL | Status: AC
  Administered 2021-02-09: 10 mg via RECTAL
  Filled 2021-02-09: qty 1

## 2021-02-09 MED ORDER — SODIUM CHLORIDE 0.9% IV SOLUTION: Freq: Once | INTRAVENOUS | Status: AC

## 2021-02-09 NOTE — Progress Notes (Signed)
Date and time results received: 02/09/2021 03:30am  Test: Hemoglobin  Critical Value: 6.3  Name of Provider Notified: Debbrah Alar PA-C  Orders Received? Or Actions Taken?: Transfuse 1 UNIT PRBCs

## 2021-02-09 NOTE — Progress Notes (Signed)
3 Days Post-Op Subjective: Patient reports soreness but feels well overall.  Denies N/V and dizziness.  Has not ambulated at all but sat up in the chair for several hours yesterday.  Hg drop to 6.3 this morning. Cr down to 1.09. No flatus or BM. Good UO.  Objective: Vital signs in last 24 hours: Temp:  [98 F (36.7 C)-98.4 F (36.9 C)] 98 F (36.7 C) (05/06 0410) Pulse Rate:  [79-98] 86 (05/06 0410) Resp:  [16-19] 19 (05/06 0410) BP: (110-156)/(48-72) 110/61 (05/06 0410) SpO2:  [89 %-97 %] 94 % (05/06 0410)  Intake/Output from previous day: 05/05 0701 - 05/06 0700 In: 2173.7 [P.O.:150; I.V.:2023.7] Out: 9833 [Urine:3250; Drains:25] Intake/Output this shift: No intake/output data recorded.  Physical Exam:  General:alert, cooperative and no distress GI: soft, non tender, faint bowel sounds Cardiac: RRR Lungs: CTA Incisions: C/D/I Foley in place with clear urine  Lab Results: Recent Labs    02/07/21 0230 02/08/21 0259 02/09/21 0300  HGB 8.8* 8.0* 6.3*  HCT 27.9* 24.1* 20.0*   BMET Recent Labs    02/08/21 0259 02/09/21 0300  NA 131* 138  K 5.0 4.1  CL 101 108  CO2 22 23  GLUCOSE 166* 114*  BUN 25* 15  CREATININE 1.63* 1.09*  CALCIUM 8.4* 8.2*   No results for input(s): LABPT, INR in the last 72 hours. No results for input(s): LABURIN in the last 72 hours. Results for orders placed or performed during the hospital encounter of 02/02/21  SARS CORONAVIRUS 2 (TAT 6-24 HRS) Nasopharyngeal Nasopharyngeal Swab     Status: None   Collection Time: 02/02/21  2:21 PM   Specimen: Nasopharyngeal Swab  Result Value Ref Range Status   SARS Coronavirus 2 NEGATIVE NEGATIVE Final    Comment: (NOTE) SARS-CoV-2 target nucleic acids are NOT DETECTED.  The SARS-CoV-2 RNA is generally detectable in upper and lower respiratory specimens during the acute phase of infection. Negative results do not preclude SARS-CoV-2 infection, do not rule out co-infections with other pathogens,  and should not be used as the sole basis for treatment or other patient management decisions. Negative results must be combined with clinical observations, patient history, and epidemiological information. The expected result is Negative.  Fact Sheet for Patients: SugarRoll.be  Fact Sheet for Healthcare Providers: https://www.woods-mathews.com/  This test is not yet approved or cleared by the [place] FDA and  has been authorized for detection and/or diagnosis of SARS-CoV-2 by FDA under an Emergency Use Authorization (EUA). This EUA will remain  in effect (meaning this test can be used) for the duration of the COVID-19 declaration under Se ction 564(b)(1) of the Act, 21 U.S.C. section 360bbb-3(b)(1), unless the authorization is terminated or revoked sooner.  Performed at [facility], [place] [address], [place], [place] [address]     Studies/Results: No results found.  Assessment/Plan: 3 Days Post-Op Procedure(s) (LRB): NEPHRECTOMY- open radical (Right)  Stable  Anemia--asymptomatic.  Some hemodilution. Transfuse 1 unit PRBCs and repeat H/H after.  Continue to closely monitor  Decrease IVF  D/C foley  AMBULATE  IS  Still no return of bowel function.  Repeat dulcolax supp. Diet was advanced yesterday.  Monitor  Cr improving  Continue DVT prophy   LOS: 3 days   Debbrah Alar 02/09/2021, 7:27 AM

## 2021-02-10 LAB — HEMOGLOBIN AND HEMATOCRIT, BLOOD
HCT: 22.5 % — ABNORMAL LOW (ref 36.0–46.0)
HCT: 26.5 % — ABNORMAL LOW (ref 36.0–46.0)
Hemoglobin: 7 g/dL — ABNORMAL LOW (ref 12.0–15.0)
Hemoglobin: 8.2 g/dL — ABNORMAL LOW (ref 12.0–15.0)

## 2021-02-10 NOTE — Progress Notes (Addendum)
4 Days Post-Op Subjective: Pain controlled.  No nausea or emesis.  Passing flatus and having bowel movements.  Tolerating diet. Voiding. Afebrile.  Objective: Vital signs in last 24 hours: Temp:  [98.6 F (37 C)-99.1 F (37.3 C)] 99.1 F (37.3 C) (05/07 0553) Pulse Rate:  [81-88] 81 (05/07 0553) Resp:  [16-19] 16 (05/07 0553) BP: (135-173)/(59-74) 152/70 (05/07 0553) SpO2:  [95 %-97 %] 95 % (05/07 0553)  Intake/Output from previous day: 05/06 0701 - 05/07 0700 In: 493.1 [I.V.:193.1; Blood:300] Out: 3351 [Urine:3350; Stool:1] Intake/Output this shift: Total I/O In: 320 [P.O.:120; I.V.:200] Out: -   UOP: 3.3L clear  Physical Exam:  General: Alert and oriented CV: RRR Lungs: Clear Abdomen: Soft, ND, ATTP; inc c/d/I Ext: NT, No erythema  Lab Results: Recent Labs    02/09/21 0300 02/09/21 1403 02/10/21 0240  HGB 6.3* 8.0* 7.0*  HCT 20.0* 25.4* 22.5*   BMET Recent Labs    02/08/21 0259 02/09/21 0300  NA 131* 138  K 5.0 4.1  CL 101 108  CO2 22 23  GLUCOSE 166* 114*  BUN 25* 15  CREATININE 1.63* 1.09*  CALCIUM 8.4* 8.2*     Studies/Results: No results found.  Assessment/Plan: 1. Right renal mass 13cm with renal vein involvement s/p right open radical nephrectomy and IVC thrombectomy on 02/06/2021  -Pain control as needed -Regular diet as tolerated -Hemoglobin 7.0 from 8.0.  Received 1 unit packed red blood cells yesterday.  Recheck at 74.  We will transfuse if less than 7. -Ordered daily labs -Passing flatus and having bowel movements -OOB, amb, IS -Keep in house today to ensure hemoglobin remained stable.   LOS: 4 days   Matt R. Yazmyne Sara MD 02/10/2021, 8:34 AM [facility]  Pager: [phone]  Repeat hgb improved at 8.2 from 7.0. No need for transfusion.  Matt R. [facility]  Pager: [phone]

## 2021-02-10 NOTE — Plan of Care (Signed)
  Problem: Skin Integrity: Goal: Risk for impaired skin integrity will decrease Outcome: Progressing   Problem: Education: Goal: Knowledge of General Education information will improve Description: Including pain rating scale, medication(s)/side effects and non-pharmacologic comfort measures Outcome: Progressing   Problem: Clinical Measurements: Goal: Diagnostic test results will improve Outcome: Progressing   Problem: Clinical Measurements: Goal: Respiratory complications will improve Outcome: Progressing   Problem: Elimination: Goal: Will not experience complications related to bowel motility Outcome: Progressing

## 2021-02-10 NOTE — Progress Notes (Signed)
Pt stable with no needs. She has been able to walk in hall with assist. No needs at time of ambulation. Rn will continue to monitor.

## 2021-02-11 LAB — CBC WITH DIFFERENTIAL/PLATELET
Abs Immature Granulocytes: 0.04 10*3/uL (ref 0.00–0.07)
Basophils Absolute: 0 10*3/uL (ref 0.0–0.1)
Basophils Relative: 0 %
Eosinophils Absolute: 0.2 10*3/uL (ref 0.0–0.5)
Eosinophils Relative: 2 %
HCT: 23.6 % — ABNORMAL LOW (ref 36.0–46.0)
Hemoglobin: 7.5 g/dL — ABNORMAL LOW (ref 12.0–15.0)
Immature Granulocytes: 1 %
Lymphocytes Relative: 23 %
Lymphs Abs: 1.5 10*3/uL (ref 0.7–4.0)
MCH: 27.9 pg (ref 26.0–34.0)
MCHC: 31.8 g/dL (ref 30.0–36.0)
MCV: 87.7 fL (ref 80.0–100.0)
Monocytes Absolute: 0.5 10*3/uL (ref 0.1–1.0)
Monocytes Relative: 8 %
Neutro Abs: 4.4 10*3/uL (ref 1.7–7.7)
Neutrophils Relative %: 66 %
Platelets: 263 10*3/uL (ref 150–400)
RBC: 2.69 MIL/uL — ABNORMAL LOW (ref 3.87–5.11)
RDW: 17.8 % — ABNORMAL HIGH (ref 11.5–15.5)
WBC: 6.6 10*3/uL (ref 4.0–10.5)
nRBC: 0 % (ref 0.0–0.2)

## 2021-02-11 LAB — BASIC METABOLIC PANEL
Anion gap: 6 (ref 5–15)
BUN: 12 mg/dL (ref 8–23)
CO2: 26 mmol/L (ref 22–32)
Calcium: 8.8 mg/dL — ABNORMAL LOW (ref 8.9–10.3)
Chloride: 106 mmol/L (ref 98–111)
Creatinine, Ser: 1.16 mg/dL — ABNORMAL HIGH (ref 0.44–1.00)
GFR, Estimated: 54 mL/min — ABNORMAL LOW (ref >60)
Glucose, Bld: 118 mg/dL — ABNORMAL HIGH (ref 70–99)
Potassium: 3.8 mmol/L (ref 3.5–5.1)
Sodium: 138 mmol/L (ref 135–145)

## 2021-02-11 NOTE — Plan of Care (Signed)
  Problem: Pain Managment: Goal: General experience of comfort will improve Outcome: Progressing   

## 2021-02-11 NOTE — Plan of Care (Signed)
  Problem: Skin Integrity: Goal: Risk for impaired skin integrity will decrease Outcome: Progressing   Problem: Clinical Measurements: Goal: Ability to maintain clinical measurements within normal limits will improve Outcome: Progressing   Problem: Activity: Goal: Risk for activity intolerance will decrease Outcome: Progressing   Problem: Nutrition: Goal: Adequate nutrition will be maintained Outcome: Progressing   Problem: Safety: Goal: Ability to remain free from injury will improve Outcome: Progressing   Problem: Skin Integrity: Goal: Risk for impaired skin integrity will decrease Outcome: Progressing

## 2021-02-11 NOTE — Progress Notes (Signed)
5 Days Post-Op Subjective: Pain controlled.  No nausea or emesis.  Passing flatus and had small BM.  Tolerating diet. Voiding. Afebrile.  Objective: Vital signs in last 24 hours: Temp:  [98.7 F (37.1 C)-99.4 F (37.4 C)] 99.3 F (37.4 C) (05/08 0508) Pulse Rate:  [77-89] 77 (05/08 0508) Resp:  [16-17] 17 (05/08 0508) BP: (151-187)/(63-74) 164/74 (05/08 0829) SpO2:  [96 %-99 %] 98 % (05/08 0508)  Intake/Output from previous day: 05/07 0701 - 05/08 0700 In: 3215.6 [P.O.:2040; I.V.:1175.6] Out: 750 [Urine:750] Intake/Output this shift: Total I/O In: 391.4 [P.O.:120; I.V.:271.4] Out: -    UOP: 727ml poorly recorded second shift  Physical Exam:  General: Alert and oriented CV: RRR Lungs: Clear Abdomen: Soft, ND, ATTP; inc c/d/I Ext: NT, No erythema  Lab Results: Recent Labs    02/10/21 0240 02/10/21 1055 02/11/21 0316  HGB 7.0* 8.2* 7.5*  HCT 22.5* 26.5* 23.6*   BMET Recent Labs    02/09/21 0300 02/11/21 0316  NA 138 138  K 4.1 3.8  CL 108 106  CO2 23 26  GLUCOSE 114* 118*  BUN 15 12  CREATININE 1.09* 1.16*  CALCIUM 8.2* 8.8*     Studies/Results: No results found.  Assessment/Plan: 1. Right renal mass 13cm with renal vein involvement s/p right open radical nephrectomy and IVC thrombectomy on 02/06/2021  -Pain control as needed -Regular diet as tolerated -Hemoglobin 7.5 from 8.2 yesterday PM. Did not require transfusion yesterday. No need for transfusion today, however will trend again tomorrow and if stable, plan to discharge home tomorrow. -Passing flatus and having bowel movements -OOB, amb, IS -Keep in house today to ensure hemoglobin remaining stable.   LOS: 5 days   Matt R. Delno Blaisdell MD 02/11/2021, 9:24 AM [facility]  Pager: [phone]

## 2021-02-11 NOTE — Progress Notes (Signed)
Patient awake at this time, had just ambulated to the bathroom independently, denies pain at this time, no needs currently, will continue to monitor.

## 2021-02-12 LAB — BASIC METABOLIC PANEL
Anion gap: 7 (ref 5–15)
BUN: 11 mg/dL (ref 8–23)
CO2: 27 mmol/L (ref 22–32)
Calcium: 8.9 mg/dL (ref 8.9–10.3)
Chloride: 102 mmol/L (ref 98–111)
Creatinine, Ser: 1.07 mg/dL — ABNORMAL HIGH (ref 0.44–1.00)
GFR, Estimated: 59 mL/min — ABNORMAL LOW (ref >60)
Glucose, Bld: 106 mg/dL — ABNORMAL HIGH (ref 70–99)
Potassium: 3.7 mmol/L (ref 3.5–5.1)
Sodium: 136 mmol/L (ref 135–145)

## 2021-02-12 LAB — BPAM RBC
Blood Product Expiration Date: 202205252359
Blood Product Expiration Date: 202205252359
Blood Product Expiration Date: 202205252359
Blood Product Expiration Date: 202205252359
Blood Product Expiration Date: 202205252359
Blood Product Expiration Date: 202205252359
ISSUE DATE / TIME: 202205031451
ISSUE DATE / TIME: 202205031500
ISSUE DATE / TIME: 202205031629
ISSUE DATE / TIME: 202205031629
ISSUE DATE / TIME: 202205060751
ISSUE DATE / TIME: 202205070323
Unit Type and Rh: 5100
Unit Type and Rh: 5100
Unit Type and Rh: 5100
Unit Type and Rh: 5100
Unit Type and Rh: 5100
Unit Type and Rh: 5100

## 2021-02-12 LAB — TYPE AND SCREEN
ABO/RH(D): O POS
Antibody Screen: NEGATIVE
Unit division: 0
Unit division: 0
Unit division: 0
Unit division: 0
Unit division: 0
Unit division: 0

## 2021-02-12 LAB — CBC WITH DIFFERENTIAL/PLATELET
Abs Immature Granulocytes: 0.04 10*3/uL (ref 0.00–0.07)
Basophils Absolute: 0 10*3/uL (ref 0.0–0.1)
Basophils Relative: 0 %
Eosinophils Absolute: 0.1 10*3/uL (ref 0.0–0.5)
Eosinophils Relative: 2 %
HCT: 23.9 % — ABNORMAL LOW (ref 36.0–46.0)
Hemoglobin: 7.5 g/dL — ABNORMAL LOW (ref 12.0–15.0)
Immature Granulocytes: 1 %
Lymphocytes Relative: 31 %
Lymphs Abs: 1.9 10*3/uL (ref 0.7–4.0)
MCH: 27.8 pg (ref 26.0–34.0)
MCHC: 31.4 g/dL (ref 30.0–36.0)
MCV: 88.5 fL (ref 80.0–100.0)
Monocytes Absolute: 0.6 10*3/uL (ref 0.1–1.0)
Monocytes Relative: 10 %
Neutro Abs: 3.6 10*3/uL (ref 1.7–7.7)
Neutrophils Relative %: 56 %
Platelets: 311 10*3/uL (ref 150–400)
RBC: 2.7 MIL/uL — ABNORMAL LOW (ref 3.87–5.11)
RDW: 17.4 % — ABNORMAL HIGH (ref 11.5–15.5)
WBC: 6.2 10*3/uL (ref 4.0–10.5)
nRBC: 0 % (ref 0.0–0.2)

## 2021-02-12 MED ORDER — BISACODYL 10 MG RE SUPP
10.0000 mg | Freq: Once | RECTAL | Status: DC
  Filled 2021-02-12 (×2): qty 1

## 2021-02-12 NOTE — Progress Notes (Signed)
6 Days Post-Op Subjective: Patient reports feeling well.  She is voiding and tolerating a regular diet.  Flatus but no BM for several days.  Ambulating without difficulty.    Objective: Vital signs in last 24 hours: Temp:  [98 F (36.7 C)-99 F (37.2 C)] 98.8 F (37.1 C) (05/09 0533) Pulse Rate:  [76-81] 76 (05/09 0533) Resp:  [17-18] 18 (05/09 0533) BP: (120-164)/(62-74) 131/70 (05/09 0533) SpO2:  [97 %-98 %] 97 % (05/09 0533)  Intake/Output from previous day: 05/08 0701 - 05/09 0700 In: 1983.5 [P.O.:1440; I.V.:543.5] Out: -  Intake/Output this shift: No intake/output data recorded.  Physical Exam:  General:alert, cooperative and no distress Cardiovascular: RRR Lungs: CTA GI: soft, non tender, normal bowel sounds Incisions: C/D/I   Lab Results: Recent Labs    02/10/21 1055 02/11/21 0316 02/12/21 0314  HGB 8.2* 7.5* 7.5*  HCT 26.5* 23.6* 23.9*   BMET Recent Labs    02/11/21 0316 02/12/21 0314  NA 138 136  K 3.8 3.7  CL 106 102  CO2 26 27  GLUCOSE 118* 106*  BUN 12 11  CREATININE 1.16* 1.07*  CALCIUM 8.8* 8.9   No results for input(s): LABPT, INR in the last 72 hours. No results for input(s): LABURIN in the last 72 hours. Results for orders placed or performed during the hospital encounter of 02/02/21  SARS CORONAVIRUS 2 (TAT 6-24 HRS) Nasopharyngeal Nasopharyngeal Swab     Status: None   Collection Time: 02/02/21  2:21 PM   Specimen: Nasopharyngeal Swab  Result Value Ref Range Status   SARS Coronavirus 2 NEGATIVE NEGATIVE Final    Comment: (NOTE) SARS-CoV-2 target nucleic acids are NOT DETECTED.  The SARS-CoV-2 RNA is generally detectable in upper and lower respiratory specimens during the acute phase of infection. Negative results do not preclude SARS-CoV-2 infection, do not rule out co-infections with other pathogens, and should not be used as the sole basis for treatment or other patient management decisions. Negative results must be combined  with clinical observations, patient history, and epidemiological information. The expected result is Negative.  Fact Sheet for Patients: SugarRoll.be  Fact Sheet for Healthcare Providers: https://www.woods-mathews.com/  This test is not yet approved or cleared by the [place] FDA and  has been authorized for detection and/or diagnosis of SARS-CoV-2 by FDA under an Emergency Use Authorization (EUA). This EUA will remain  in effect (meaning this test can be used) for the duration of the COVID-19 declaration under Se ction 564(b)(1) of the Act, 21 U.S.C. section 360bbb-3(b)(1), unless the authorization is terminated or revoked sooner.  Performed at [facility], [place] [address] [place], [place] [address]     Studies/Results: No results found.  Assessment/Plan: 6 Days Post-Op, Procedure(s) (LRB): NEPHRECTOMY- open radical (Right)  Continues to improve H/H stable at 7.5.  Asymptomatic.  Repeat tomorrow am Cr stable Dulcolax supp for BM Cont regular diet Ambulate, Incentive spirometry DVT prophylaxis D/C home tomorrow if H/H remains stable  LOS: 6 days   Debbrah Alar 02/12/2021, 7:07 AM

## 2021-02-12 NOTE — Plan of Care (Signed)
  Problem: Pain Managment: Goal: General experience of comfort will improve Outcome: Progressing   

## 2021-02-13 LAB — CBC WITH DIFFERENTIAL/PLATELET
Abs Immature Granulocytes: 0.07 10*3/uL (ref 0.00–0.07)
Basophils Absolute: 0 10*3/uL (ref 0.0–0.1)
Basophils Relative: 0 %
Eosinophils Absolute: 0.1 10*3/uL (ref 0.0–0.5)
Eosinophils Relative: 2 %
HCT: 25.8 % — ABNORMAL LOW (ref 36.0–46.0)
Hemoglobin: 8.1 g/dL — ABNORMAL LOW (ref 12.0–15.0)
Immature Granulocytes: 1 %
Lymphocytes Relative: 30 %
Lymphs Abs: 2.2 10*3/uL (ref 0.7–4.0)
MCH: 27.4 pg (ref 26.0–34.0)
MCHC: 31.4 g/dL (ref 30.0–36.0)
MCV: 87.2 fL (ref 80.0–100.0)
Monocytes Absolute: 0.5 10*3/uL (ref 0.1–1.0)
Monocytes Relative: 7 %
Neutro Abs: 4.2 10*3/uL (ref 1.7–7.7)
Neutrophils Relative %: 60 %
Platelets: 391 10*3/uL (ref 150–400)
RBC: 2.96 MIL/uL — ABNORMAL LOW (ref 3.87–5.11)
RDW: 17.2 % — ABNORMAL HIGH (ref 11.5–15.5)
WBC: 7.1 10*3/uL (ref 4.0–10.5)
nRBC: 0 % (ref 0.0–0.2)

## 2021-02-13 LAB — BASIC METABOLIC PANEL
Anion gap: 5 (ref 5–15)
BUN: 16 mg/dL (ref 8–23)
CO2: 28 mmol/L (ref 22–32)
Calcium: 9.3 mg/dL (ref 8.9–10.3)
Chloride: 103 mmol/L (ref 98–111)
Creatinine, Ser: 1.18 mg/dL — ABNORMAL HIGH (ref 0.44–1.00)
GFR, Estimated: 53 mL/min — ABNORMAL LOW (ref >60)
Glucose, Bld: 101 mg/dL — ABNORMAL HIGH (ref 70–99)
Potassium: 4.1 mmol/L (ref 3.5–5.1)
Sodium: 136 mmol/L (ref 135–145)

## 2021-02-13 NOTE — Discharge Summary (Signed)
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 Briefly, Deanna Bush is a 60 y.o. year old patient with a 13cm right renal mass with renal vein involvement.  HGB 10.3. Calcium 9.8. She has been having gross hematuria for 2 months.  Hospital Course:  Pt was admitted and taken to the OR on 02/06/21 for open right radical nephrectomy and IVC thrombectomy with IVC resection.  Pt had a 4L blood loss during the procedure but was stablilized and received several units of PRBCs intra-operatively.  Pt tolerated the procedure well and was hemodynamically stable immediately post op.  She was extubated without complication and woke up from anesthesia neurologically intact. Her post op course progressed as expected.  She remained AF with stable vitals post op. She did require an additional transfusion of 1 unit PRBCs for a Hg of 6.3 on POD 3. H/H remained stable after that time. She was able to ambulate without difficulty.  Foley was removed on POD 3 and pt was able to void.  Bowel function returned and her diet was advanced accordingly.  Cr peaked at 1.63 on POD 2 and trended back down to 1.18 on POD 7. She was maintained on Lovenox throughout the post op course. On POD 7 pt had met all discharge criteria and felt stable for d/c home.   Laboratory values: Recent Labs    02/11/21 0316 02/12/21 0314 02/13/21 0342  HGB 7.5* 7.5* 8.1*  HCT 23.6* 23.9* 25.8*    Disposition: Home  Discharge instruction: The patient was instructed to be ambulatory but to refrain from heavy lifting, strenuous activity, or driving.   Discharge medications: . Allergies as of 02/13/2021      Reactions   Sulfonamide Derivatives Itching   Yellow  Jacket Venom [bee Venom] Rash   urticaria      Medication List    STOP taking these medications   multivitamin tablet     TAKE these medications   amLODipine 5 MG tablet Commonly known as: NORVASC Take 1 tablet (5 mg total) by mouth daily.   atorvastatin 10 MG tablet Commonly known as: LIPITOR TAKE ONE TABLET BY MOUTH ONCE DAILY What changed:   how much to take  how to take this  when to take this  additional instructions   EPINEPHrine 0.3 mg/0.3 mL Soaj injection Commonly known as: EPI-PEN INJECT 0.3MLS(1 SYRINGE) INTO THE MUSCLE ONCE AS NEEDED FOR ALLERGIC REACTION What changed:   how much to take  how to take this  when to take this  reasons to take this  additional instructions   HYDROcodone-acetaminophen 5-325 MG tablet Commonly known as: Norco Take 1-2 tablets by mouth every 6 (six) hours as needed for moderate pain or severe pain.   levothyroxine 88 MCG tablet Commonly known as: SYNTHROID TAKE 1 TABLET(88 MCG) BY MOUTH DAILY BEFORE BREAKFAST What changed: See the new instructions.   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       Followup:   Follow-up Information    McKenzie, Candee Furbish, MD On 02/14/2021.   Specialty: Urology Why: at 1:00 Contact information: [place] [address] [phone]

## 2021-02-14 ENCOUNTER — Ambulatory Visit: Payer: No Typology Code available for payment source | Admitting: Urology

## 2021-02-15 ENCOUNTER — Telehealth: Payer: Self-pay

## 2021-02-15 NOTE — Telephone Encounter (Signed)
Transition Care Management Follow-up Telephone Call  Date of discharge and from where: 02/13/21 from [facility]   How have you been since you were released from the hospital? 02/13/21  Any questions or concerns? No  Items Reviewed:  Did the pt receive and understand the discharge instructions provided? Yes   Medications obtained and verified? Yes   Other? No   Any new allergies since your discharge? No   Dietary orders reviewed? No  Do you have support at home? Yes   Home Care and Equipment/Supplies: Were home health services ordered? no If so, what is the name of the agency? NA  Has the agency set up a time to come to the patient's home? not applicable Were any new equipment or medical supplies ordered?  No What is the name of the medical supply agency? NA Were you able to get the supplies/equipment? not applicable Do you have any questions related to the use of the equipment or supplies? No  Functional Questionnaire: (I = Independent and D = Dependent) ADLs: I   Bathing/Dressing- I  Meal Prep- I  Eating- I  Maintaining continence- I  Transferring/Ambulation- I  Managing Meds- I  Follow up appointments reviewed:   PCP Hospital f/u appt confirmed? Yes  Scheduled to see Dr. Posey Pronto on 02/22/21 @ 2:20.  [facility] f/u appt confirmed? Yes  Scheduled to see Dr. Alyson Ingles on 02/21/21 @ 11:00.  Are transportation arrangements needed? No   If their condition worsens, is the pt aware to call PCP or go to the Emergency Dept.? Yes  Was the patient provided with contact information for the PCP's office or ED? Yes  Was to pt encouraged to call back with questions or concerns? Yes

## 2021-02-16 ENCOUNTER — Telehealth: Payer: Self-pay

## 2021-02-19 NOTE — Telephone Encounter (Signed)
Opened in error

## 2021-02-21 ENCOUNTER — Encounter: Payer: Self-pay | Admitting: Urology

## 2021-02-21 ENCOUNTER — Other Ambulatory Visit: Payer: Self-pay

## 2021-02-21 ENCOUNTER — Ambulatory Visit (INDEPENDENT_AMBULATORY_CARE_PROVIDER_SITE_OTHER): Payer: No Typology Code available for payment source | Admitting: Urology

## 2021-02-21 VITALS — BP 148/72 | HR 84 | Temp 98.4°F | Resp 16 | Ht 66.0 in | Wt 185.4 lb

## 2021-02-21 DIAGNOSIS — Z905 Acquired absence of kidney: Secondary | ICD-10-CM

## 2021-02-21 LAB — MICROSCOPIC EXAMINATION
Epithelial Cells (non renal): >10 /hpf — AB (ref 0–10)
Renal Epithel, UA: NONE SEEN /hpf

## 2021-02-21 LAB — URINALYSIS, ROUTINE W REFLEX MICROSCOPIC
Bilirubin, UA: NEGATIVE
Glucose, UA: NEGATIVE
Ketones, UA: NEGATIVE
Leukocytes,UA: NEGATIVE
Nitrite, UA: NEGATIVE
Protein,UA: NEGATIVE
Specific Gravity, UA: 1.025 (ref 1.005–1.030)
Urobilinogen, Ur: 0.2 mg/dL (ref 0.2–1.0)
pH, UA: 5.5 (ref 5.0–7.5)

## 2021-02-21 NOTE — Progress Notes (Signed)
02/21/2021 11:08 AM   Deanna Bush 1961-08-08 [id number]  Referring provider: Fayrene Helper, MD [address] [place],  [place] [address]  Followup right radical nephrectomy  HPI: Ms Pry is a 60yo here for followup after right radical nephrectomy. 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 Margins negative. She denies any abdominal pain. Energy good.    PMH: Past Medical History:  Diagnosis Date  . Adenocarcinoma of breast ([place])    left   . Cellulitis of leg, left   . Complication of anesthesia    Hard to wake up  . Diabetes mellitus without complication ([place])    Pt denies  . Family history of breast cancer 08/16/2011  . Hyperlipidemia   . Hypertension   . Hypothyroidism   . MRSA (methicillin resistant staph aureus) culture positive 08/19/2011  . Pre-diabetes     Surgical History: Past Surgical History:  Procedure Laterality Date  . BREAST SURGERY Left    mastectomy  . CESAREAN SECTION     x2  . COLONOSCOPY N/A 03/03/2020   Procedure: COLONOSCOPY;  Surgeon: Daneil Dolin, MD;  Location: [facility];  Service: Endoscopy;  Laterality: N/A;  12:00  . left mastectomy    . NEPHRECTOMY Right 02/06/2021   Procedure: NEPHRECTOMY- open radical;  Surgeon: Cleon Gustin, MD;  Location: [facility];  Service: Urology;  Laterality: Right;  . POLYPECTOMY  03/03/2020   Procedure: POLYPECTOMY;  Surgeon: Daneil Dolin, MD;  Location: [facility];  Service: Endoscopy;;    Home Medications:  Allergies as of 02/21/2021      Reactions   Sulfonamide Derivatives Itching   Yellow Jacket Venom [bee Venom] Rash   urticaria      Medication List       Accurate as of Feb 21, 2021 11:08 AM. If you have any questions, ask your nurse or doctor.        amLODipine 5 MG tablet Commonly known as: NORVASC Take 1 tablet (5 mg total) by mouth daily.   atorvastatin 10 MG tablet Commonly known as: LIPITOR TAKE ONE TABLET BY MOUTH ONCE DAILY What changed:   how  much to take  how to take this  when to take this  additional instructions   EPINEPHrine 0.3 mg/0.3 mL Soaj injection Commonly known as: EPI-PEN INJECT 0.3MLS(1 SYRINGE) INTO THE MUSCLE ONCE AS NEEDED FOR ALLERGIC REACTION What changed:   how much to take  how to take this  when to take this  reasons to take this  additional instructions   HYDROcodone-acetaminophen 5-325 MG tablet Commonly known as: Norco Take 1-2 tablets by mouth every 6 (six) hours as needed for moderate pain or severe pain.   levothyroxine 88 MCG tablet Commonly known as: SYNTHROID TAKE 1 TABLET(88 MCG) BY MOUTH DAILY BEFORE BREAKFAST What changed: See the new instructions.   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       Allergies:  Allergies  Allergen Reactions  . Sulfonamide Derivatives Itching  . Yellow Jacket Venom [Bee Venom] Rash    urticaria    Family History: Family History  Problem Relation Age of Onset  . Hypertension Mother   . Diabetes Mother   . Hyperlipidemia Mother   . COPD Brother   . Cancer Sister        breast  . Cancer Sister        breast  Social History:  reports that she quit smoking about 18 years ago. Her smoking use included cigarettes. She has a 9.00 pack-year smoking history. She has never used smokeless tobacco. She reports that she does not drink alcohol and does not use drugs.  ROS: All other review of systems were reviewed and are negative except what is noted above in HPI  Physical Exam: BP (!) 148/72   Pulse 84   Temp 98.4 F (36.9 C) (Oral)   Resp 16   Ht 5\' 6"  (1.676 m)   Wt 185 lb 6.4 oz (84.1 kg)   BMI 29.92 kg/m   Constitutional:  Alert and oriented, No acute distress. HEENT: [place] AT, moist mucus membranes.  Trachea midline, no masses. Cardiovascular: No clubbing, cyanosis, or edema. Respiratory: Normal respiratory effort, no increased work of breathing. GI:  Abdomen is soft, nontender, nondistended, no abdominal masses GU: No CVA tenderness.  Lymph: No cervical or inguinal lymphadenopathy. Skin: No rashes, bruises or suspicious lesions. Neurologic: Grossly intact, no focal deficits, moving all 4 extremities. Psychiatric: Normal mood and affect.  Laboratory Data: Lab Results  Component Value Date   WBC 7.1 02/13/2021   HGB 8.1 (L) 02/13/2021   HCT 25.8 (L) 02/13/2021   MCV 87.2 02/13/2021   PLT 391 02/13/2021    Lab Results  Component Value Date   CREATININE 1.18 (H) 02/13/2021    No results found for: PSA  No results found for: TESTOSTERONE  Lab Results  Component Value Date   HGBA1C 6.6 (H) 02/02/2021    Urinalysis    Component Value Date/Time   COLORURINE RED (A) 12/23/2020 1939   APPEARANCEUR Hazy (A) 01/01/2021 1434   LABSPEC 1.002 (L) 08/25/2020 2020   PHURINE 7.0 08/25/2020 2020   GLUCOSEU Negative 01/01/2021 1434   HGBUR (A) 12/23/2020 1939    TEST NOT REPORTED DUE TO COLOR INTERFERENCE OF URINE PIGMENT   BILIRUBINUR Negative 01/01/2021 1434   KETONESUR (A) 12/23/2020 1939    TEST NOT REPORTED DUE TO COLOR INTERFERENCE OF URINE PIGMENT   PROTEINUR Negative 01/01/2021 1434   PROTEINUR (A) 12/23/2020 1939    TEST NOT REPORTED DUE TO COLOR INTERFERENCE OF URINE PIGMENT   UROBILINOGEN 0.2 10/30/2020 1157   NITRITE Negative 01/01/2021 1434   NITRITE (A) 12/23/2020 1939    TEST NOT REPORTED DUE TO COLOR INTERFERENCE OF URINE PIGMENT   LEUKOCYTESUR Negative 01/01/2021 1434   LEUKOCYTESUR (A) 12/23/2020 1939    TEST NOT REPORTED DUE TO COLOR INTERFERENCE OF URINE PIGMENT    Lab Results  Component Value Date   LABMICR See below: 01/01/2021   WBCUA 0-5 01/01/2021   LABEPIT 0-10 01/01/2021   BACTERIA None seen 01/01/2021    Pertinent Imaging:  No results found for this or any previous visit.  No results found for this or any previous visit.  No results found for this or any previous visit.  No results  found for this or any previous visit.  No results found for this or any previous visit.  No results found for this or any previous visit.  No results found for this or any previous visit.  Results for orders placed during the hospital encounter of 12/23/20  CT RENAL STONE STUDY  Narrative CLINICAL DATA:  60 year old female with flank pain. Concern for kidney stone.  EXAM: CT ABDOMEN AND PELVIS WITHOUT CONTRAST  TECHNIQUE: Multidetector CT imaging of the abdomen and pelvis was performed following the standard protocol without IV contrast.  COMPARISON:  None.  FINDINGS:  Evaluation of this exam is limited in the absence of intravenous contrast.  Lower chest: Bibasilar linear atelectasis/scarring. There is a 3 mm right lower lobe nodule (8/4). The visualized lung bases are otherwise clear. There is hypoattenuation of the cardiac blood pool suggestive of anemia. Clinical correlation is recommended.  No intra-abdominal free air or free fluid.  Hepatobiliary: Apparent infiltration of the right renal mass into the posterior right lobe of the liver (segment VII). The liver is otherwise unremarkable. No intrahepatic biliary dilatation. No calcified gallstone or pericholecystic fluid.  Pancreas: Unremarkable. No pancreatic ductal dilatation or surrounding inflammatory changes.  Spleen: Normal in size without focal abnormality.  Adrenals/Urinary Tract: The adrenal glands unremarkable. There is a solid predominantly hypoattenuating right renal upper pole mass measuring approximately 6 x 9 cm in greatest axial dimensions and 11 cm in craniocaudal length most consistent with malignancy. Further characterization with renal mass protocol MRI on a nonemergent/outpatient basis recommended. There is superior extension of the mass with apparent invasion into the right lobe of the liver. There is probable extension of the mass into the right renal pelvis with a degree of obstruction  and mild right hydronephrosis. There is no hydronephrosis or nephrolithiasis on the left. The visualized ureters and urinary bladder appear unremarkable.  Stomach/Bowel: There is moderate stool throughout the colon. No bowel obstruction or active inflammation. The appendix is normal.  Vascular/Lymphatic: Mild aortoiliac atherosclerotic disease. The IVC is unremarkable. Evaluation for possible tumor thrombus is limited on this noncontrast CT. CT with IV contrast may provide better evaluation. No portal venous gas. No adenopathy.  Reproductive: The uterus is anteverted and grossly unremarkable. No adnexal masses.  Other: Left mastectomy with partially visualized prosthesis.  Musculoskeletal: Degenerative changes of the spine. No acute osseous pathology.  IMPRESSION: 1. Large right renal upper pole mass most consistent with malignancy. CT with IV contrast may provide better evaluation of potential tumor thrombus. 2. Mild right hydronephrosis secondary to extension of the mass into the right renal pelvis. 3. No bowel obstruction. Normal appendix. 4. A 3 mm right lower lobe nodule. 5. Aortic Atherosclerosis (ICD10-I70.0).   Electronically Signed By: Anner Crete M.D. On: 12/24/2020 00:49   Assessment & Plan:    1. S/p nephrectomy -CBC and CMP today -RTC 1 week for stable removal -RTC 3 months with CT, CXR, and CMP    No follow-ups on file.  Nicolette Bang, MD  [facility] [place]

## 2021-02-21 NOTE — Progress Notes (Signed)
Urological Symptom Review  Patient is experiencing the following symptoms: None    Review of Systems  Gastrointestinal (upper)  : Negative for upper GI symptoms  Gastrointestinal (lower) : Negative for lower GI symptoms  Constitutional : Negative for symptoms  Skin: Negative for skin symptoms  Eyes: Negative for eye symptoms  Ear/Nose/Throat : Negative for Ear/Nose/Throat symptoms  Hematologic/Lymphatic: Negative for Hematologic/Lymphatic symptoms  Cardiovascular : Negative for cardiovascular symptoms  Respiratory : Negative for respiratory symptoms  Endocrine: Negative  Musculoskeletal: Negative for musculoskeletal symptoms  Neurological: Negative for neurological symptoms  Psychologic: Negative for psychiatric symptoms

## 2021-02-21 NOTE — Patient Instructions (Signed)
Kidney Cancer  Kidney cancer is an abnormal growth of cells in one or both kidneys. The kidneys filter waste from your blood and produce urine. Kidney cancer may spread to other parts of your body. This type of cancer may also be called renal cell carcinoma. What are the causes? The cause of this condition is not always known. In some cases, abnormal changes to genes (genetic mutations) can cause cells to form cancer. What increases the risk? You may be more likely to develop kidney cancer if you:  Are over age 60. The risk increases with age.  Have a family history of kidney cancer.  Are of African-American, Native American, or Native [place] descent.  Smoke.  Are female.  Are obese.  Have high blood pressure (hypertension).  Have advanced kidney disease, especially if you need long-term dialysis.  Have certain conditions that are passed from parent to child (inherited), such as von Hippel-Lindau disease, tuberous sclerosis, or hereditary papillary renal carcinoma.  Have been exposed to certain chemicals. What are the signs or symptoms? In the early stages, kidney cancer does not cause symptoms. As the cancer grows, symptoms may include:  Blood in the urine.  Pain in the upper back or abdomen, just below the rib cage. You may feel pain on one or both sides of the body.  Fatigue.  Unexplained weight loss.  Fever. How is this diagnosed? This condition may be diagnosed based on:  Your symptoms and medical history.  A physical exam.  Blood and urine tests.  X-rays.  Imaging tests, such as CT scans, MRIs, and PET scans.  Having dye injected into your blood through an IV, and then having X-rays taken of: ? Your kidneys and the rest of the organs involved in making and storing urine (intravenous pyelogram). ? Your blood vessels (angiogram).  Removal and testing of a kidney tissue sample (biopsy). Your cancer will be assessed (staged), based on how severe it is and how  much it has spread. How is this treated? Treatment depends on the type and stage of the cancer. Treatment may include one or more of the following:  Surgery. This may include surgery to remove: ? Just the tumor (nephron-sparing surgery). ? The entire kidney (nephrectomy). ? The kidney, some of the surrounding healthy tissue, nearby lymph nodes, and the adrenal gland in certain cases (radical nephrectomy).  Medicines that kill cancer cells (chemotherapy).  High-energy rays that kill cancer cells (radiation therapy).  Targeted therapy. This targets specific parts of cancer cells and the area around them to block the growth and the spread of the cancer. Targeted therapy can help to limit the damage to healthy cells.  Medicines that help your body's disease-fighting system (immune system) fight cancer cells (immunotherapy).  Freezing cancer cells using gas or liquid that is delivered through a needle (cryoablation).  Destroying cancer cells using high-energy radio waves that are delivered through a needle-like probe (radiofrequency ablation).  A procedure to block the artery that supplies blood to the tumor, which kills the cancer cells (embolization). Follow these instructions at home: Eating and drinking  Some of your treatments might affect your appetite and your ability to chew and swallow. If you are having problems eating, or if you do not have an appetite, meet with a diet and nutrition specialist (dietitian).  If you have side effects that affect eating, it may help to: ? Eat smaller meals and snacks often. ? Drink high-nutrition and high-calorie shakes or supplements. ? Eat bland and soft   foods that are easy to eat. ? Not eat foods that are hot, spicy, or hard to swallow. Lifestyle  Do not drink alcohol.  Do not use any products that contain nicotine or tobacco, such as cigarettes and e-cigarettes. If you need help quitting, ask your health care provider. General  instructions  Take over-the-counter and prescription medicines only as told by your health care provider. This includes vitamins, supplements, and herbal products.  Consider joining a support group to help you cope with the stress of having kidney cancer.  Work with your health care provider to manage any side effects of treatment.  Keep all follow-up visits as told by your health care provider. This is important.   Where to find more information  American Cancer Society: https://www.cancer.org  National Cancer Institute (NCI): https://www.cancer.gov Contact a health care provider if you:  Notice that you bruise or bleed easily.  Are losing weight without trying.  Have new or increased fatigue or weakness. Get help right away if you have:  Blood in your urine.  A sudden increase in pain.  A fever.  Shortness of breath.  Chest pain.  Yellow skin or whites of your eyes (jaundice). Summary  Kidney cancer is an abnormal growth of cells (tumor) in one or both kidneys. Tumors may spread to other parts of your body.  In the early stages, kidney cancer does not cause symptoms. As the cancer grows, symptoms may include blood in the urine, pain in the upper back or abdomen, unexplained weight loss, fatigue, and fever.  Treatment depends on the type and stage of the cancer. It may include surgery to remove the tumor, procedures and medicines to kill the cancer cells, or medicines to help your body fight cancer cells. This information is not intended to replace advice given to you by your health care provider. Make sure you discuss any questions you have with your health care provider. Document Revised: 10/15/2017 Document Reviewed: 10/12/2017 Elsevier Patient Education  2021 [employer]  

## 2021-02-21 NOTE — Progress Notes (Signed)
21 staples removed and steri strips applied.

## 2021-02-22 ENCOUNTER — Encounter: Payer: Self-pay | Admitting: Internal Medicine

## 2021-02-22 ENCOUNTER — Ambulatory Visit: Payer: No Typology Code available for payment source | Admitting: Internal Medicine

## 2021-02-22 VITALS — BP 129/66 | HR 66 | Temp 98.5°F | Resp 18 | Ht 66.0 in | Wt 187.0 lb

## 2021-02-22 DIAGNOSIS — D509 Iron deficiency anemia, unspecified: Secondary | ICD-10-CM | POA: Diagnosis not present

## 2021-02-22 DIAGNOSIS — Z905 Acquired absence of kidney: Secondary | ICD-10-CM | POA: Diagnosis not present

## 2021-02-22 DIAGNOSIS — Z91030 Bee allergy status: Secondary | ICD-10-CM

## 2021-02-22 DIAGNOSIS — Z09 Encounter for follow-up examination after completed treatment for conditions other than malignant neoplasm: Secondary | ICD-10-CM | POA: Diagnosis not present

## 2021-02-22 LAB — COMPREHENSIVE METABOLIC PANEL
ALT: 24 IU/L (ref 0–32)
AST: 19 IU/L (ref 0–40)
Albumin/Globulin Ratio: 1.2 (ref 1.2–2.2)
Albumin: 4 g/dL (ref 3.8–4.8)
Alkaline Phosphatase: 103 IU/L (ref 44–121)
BUN/Creatinine Ratio: 15 (ref 12–28)
BUN: 16 mg/dL (ref 8–27)
Bilirubin Total: <0.2 mg/dL (ref 0.0–1.2)
CO2: 21 mmol/L (ref 20–29)
Calcium: 9.6 mg/dL (ref 8.7–10.3)
Chloride: 102 mmol/L (ref 96–106)
Creatinine, Ser: 1.06 mg/dL — ABNORMAL HIGH (ref 0.57–1.00)
Globulin, Total: 3.4 g/dL (ref 1.5–4.5)
Glucose: 97 mg/dL (ref 65–99)
Potassium: 4.5 mmol/L (ref 3.5–5.2)
Sodium: 138 mmol/L (ref 134–144)
Total Protein: 7.4 g/dL (ref 6.0–8.5)
eGFR: 60 mL/min/{1.73_m2} (ref >59)

## 2021-02-22 LAB — CBC WITH DIFFERENTIAL/PLATELET
Basophils Absolute: 0 10*3/uL (ref 0.0–0.2)
Basos: 0 %
EOS (ABSOLUTE): 0.1 10*3/uL (ref 0.0–0.4)
Eos: 2 %
Hematocrit: 28.1 % — ABNORMAL LOW (ref 34.0–46.6)
Hemoglobin: 8.9 g/dL — ABNORMAL LOW (ref 11.1–15.9)
Immature Grans (Abs): 0 10*3/uL (ref 0.0–0.1)
Immature Granulocytes: 0 %
Lymphocytes Absolute: 1.6 10*3/uL (ref 0.7–3.1)
Lymphs: 24 %
MCH: 27.5 pg (ref 26.6–33.0)
MCHC: 31.7 g/dL (ref 31.5–35.7)
MCV: 87 fL (ref 79–97)
Monocytes Absolute: 0.3 10*3/uL (ref 0.1–0.9)
Monocytes: 5 %
Neutrophils Absolute: 4.7 10*3/uL (ref 1.4–7.0)
Neutrophils: 69 %
Platelets: 409 10*3/uL (ref 150–450)
RBC: 3.24 x10E6/uL — ABNORMAL LOW (ref 3.77–5.28)
RDW: 15.9 % — ABNORMAL HIGH (ref 11.7–15.4)
WBC: 6.8 10*3/uL (ref 3.4–10.8)

## 2021-02-22 MED ORDER — IRON (FERROUS SULFATE) 325 (65 FE) MG PO TABS: 325.0000 mg | ORAL_TABLET | Freq: Every day | ORAL | 3 refills | Status: DC

## 2021-02-22 MED ORDER — EPINEPHRINE 0.3 MG/0.3ML IJ SOAJ
0.3000 mg | INTRAMUSCULAR | 0 refills | Status: DC | PRN
Start: 2021-02-22 — End: 2021-12-07

## 2021-02-22 NOTE — Assessment & Plan Note (Signed)
Hospital chart reviewed including discharge summary S/p right nephrectomy PRBC transfusions for anemia Medications reconciled and reviewed with the patient

## 2021-02-22 NOTE — Progress Notes (Signed)
 Established Patient Office Visit  Subjective:  Patient ID: Deanna Bush, female    DOB: 05/30/1961  Age: 59 y.o. MRN: [id number]  CC:  Chief Complaint  Patient presents with  . Transitions Of Care    Pt was discharged may 10 from [facility] had kidney removed she feels like she is doing good her appetite is low but other than that she is good needs refill on epi pen but this can not go to [facility] need to [facility]     HPI Deanna Bush  is a 59-year-old female with PMH of RCC, HLD, hypothyroidism and allergies who presents for hospital discharge follow-up.  She was admitted on 05/03 for right nephrectomy for RCC. She had 4 l blood loss and had to be transfused multiple PRBCs. She stable postop course, except requiring 1 more PRBC transfusion for anemia. She was discharged on 05/10. She has been feeling better now. Denies any hematuria or dysuria. She had follow up visit with Dr Mackenzie yesterday. Her Hb is stable at 8.9 now. Denies any dizziness, lightheadedness or dyspnea.  Past Medical History:  Diagnosis Date  . Adenocarcinoma of breast (HCC)    left   . Cellulitis of leg, left   . Complication of anesthesia    Hard to wake up  . Diabetes mellitus without complication (HCC)    Pt denies  . Family history of breast cancer 08/16/2011  . Hyperlipidemia   . Hypertension   . Hypothyroidism   . MRSA (methicillin resistant staph aureus) culture positive 08/19/2011  . Pre-diabetes     Past Surgical History:  Procedure Laterality Date  . BREAST SURGERY Left    mastectomy  . CESAREAN SECTION     x2  . COLONOSCOPY N/A 03/03/2020   Procedure: COLONOSCOPY;  Surgeon: Rourk, Robert M, MD;  Location: [facility];  Service: Endoscopy;  Laterality: N/A;  12:00  . left mastectomy    . NEPHRECTOMY Right 02/06/2021   Procedure: NEPHRECTOMY- open radical;  Surgeon: McKenzie, Patrick L, MD;  Location: [facility];  Service: Urology;  Laterality: Right;  . POLYPECTOMY  03/03/2020    Procedure: POLYPECTOMY;  Surgeon: Rourk, Robert M, MD;  Location: [facility];  Service: Endoscopy;;    Family History  Problem Relation Age of Onset  . Hypertension Mother   . Diabetes Mother   . Hyperlipidemia Mother   . COPD Brother   . Cancer Sister        breast  . Cancer Sister        breast    Social History   Socioeconomic History  . Marital status: Married    Spouse name: Not on file  . Number of children: 2  . Years of education: Not on file  . Highest education level: Not on file  Occupational History  . Occupation: CNA  Tobacco Use  . Smoking status: Former Smoker    Packs/day: 0.50    Years: 18.00    Pack years: 9.00    Types: Cigarettes    Quit date: 07/01/2002    Years since quitting: 18.6  . Smokeless tobacco: Never Used  Vaping Use  . Vaping Use: Never used  Substance and Sexual Activity  . Alcohol use: No  . Drug use: No  . Sexual activity: Not Currently  Other Topics Concern  . Not on file  Social History Narrative  . Not on file   Social Determinants of Health   Financial Resource Strain:   Not on file  Food Insecurity: Not on file  Transportation Needs: Not on file  Physical Activity: Not on file  Stress: Not on file  Social Connections: Not on file  Intimate Partner Violence: Not on file    Outpatient Medications Prior to Visit  Medication Sig Dispense Refill  . amLODipine (NORVASC) 5 MG tablet Take 1 tablet (5 mg total) by mouth daily. 30 tablet 2  . atorvastatin (LIPITOR) 10 MG tablet TAKE ONE TABLET BY MOUTH ONCE DAILY (Patient taking differently: Take 10 mg by mouth daily.) 90 tablet 3  . levothyroxine (SYNTHROID) 88 MCG tablet TAKE 1 TABLET(88 MCG) BY MOUTH DAILY BEFORE BREAKFAST (Patient taking differently: Take 88 mcg by mouth daily before breakfast.) 90 tablet 3  . UNABLE TO FIND MASTECTOMY BRA AND PROTHESIS DX Z90.12 6 each 2  . UNABLE TO FIND 6 mastectomy prosthesis and bras. 6 each 0  . EPINEPHrine 0.3 mg/0.3 mL IJ  SOAJ injection INJECT 0.3MLS(1 SYRINGE) INTO THE MUSCLE ONCE AS NEEDED FOR ALLERGIC REACTION (Patient taking differently: Inject 0.3 mg into the muscle as needed for anaphylaxis.) 2 each 0  . HYDROcodone-acetaminophen (NORCO) 5-325 MG tablet Take 1-2 tablets by mouth every 6 (six) hours as needed for moderate pain or severe pain. (Patient not taking: Reported on 02/22/2021) 30 tablet 0   No facility-administered medications prior to visit.    Allergies  Allergen Reactions  . Sulfonamide Derivatives Itching  . [place] Venom [Bee Venom] Rash    urticaria    ROS Review of Systems  Constitutional: Negative for chills and fever.  HENT: Negative for congestion, sinus pressure, sinus pain and sore throat.   Eyes: Negative for pain and discharge.  Respiratory: Negative for cough and shortness of breath.   Cardiovascular: Negative for chest pain and palpitations.  Gastrointestinal: Negative for abdominal pain, constipation, diarrhea, nausea and vomiting.  Endocrine: Negative for polydipsia and polyuria.  Genitourinary: Negative for difficulty urinating, dysuria, flank pain, frequency, hematuria, urgency, vaginal discharge and vaginal pain.  Musculoskeletal: Negative for neck pain and neck stiffness.  Skin: Negative for rash.  Neurological: Negative for dizziness and weakness.  Psychiatric/Behavioral: Negative for agitation and behavioral problems.      Objective:    Physical Exam Vitals reviewed.  Constitutional:      General: She is not in acute distress.    Appearance: She is not diaphoretic.  HENT:     Head: Normocephalic and atraumatic.     Nose: Nose normal. No congestion.     Mouth/Throat:     Mouth: Mucous membranes are moist.     Pharynx: No posterior oropharyngeal erythema.  Eyes:     General: No scleral icterus.    Extraocular Movements: Extraocular movements intact.  Cardiovascular:     Rate and Rhythm: Normal rate and regular rhythm.     Pulses: Normal pulses.      Heart sounds: Normal heart sounds. No murmur heard.   Pulmonary:     Breath sounds: Normal breath sounds. No wheezing or rales.  Abdominal:     Palpations: Abdomen is soft.     Tenderness: There is no abdominal tenderness.     Comments: Midline vertical incision, intact staples, no oozing or discharge, site C/D/I  Musculoskeletal:     Cervical back: Neck supple. No tenderness.     Right lower leg: No edema.     Left lower leg: No edema.  Skin:    General: Skin is warm.     Findings: No rash.  Neurological:     General: No focal deficit present.     Mental Status: She is alert and oriented to person, place, and time.     Sensory: No sensory deficit.     Motor: No weakness.  Psychiatric:        Mood and Affect: Mood normal.        Behavior: Behavior normal.     BP 129/66 (BP Location: Right Arm, Patient Position: Sitting, Cuff Size: Normal)   Pulse 66   Temp 98.5 F (36.9 C) (Oral)   Resp 18   Ht 5' 6" (1.676 m)   Wt 187 lb (84.8 kg)   SpO2 98%   BMI 30.18 kg/m  Wt Readings from Last 3 Encounters:  02/22/21 187 lb (84.8 kg)  02/21/21 185 lb 6.4 oz (84.1 kg)  02/02/21 188 lb (85.3 kg)     Health Maintenance Due  Topic Date Due  . PAP SMEAR-Modifier  10/14/2020  . COVID-19 Vaccine (4 - Booster for Moderna series) 11/09/2020    There are no preventive care reminders to display for this patient.  Lab Results  Component Value Date   TSH 0.657 10/30/2020   Lab Results  Component Value Date   WBC 6.8 02/21/2021   HGB 8.9 (L) 02/21/2021   HCT 28.1 (L) 02/21/2021   MCV 87 02/21/2021   PLT 409 02/21/2021   Lab Results  Component Value Date   NA 138 02/21/2021   K 4.5 02/21/2021   CO2 21 02/21/2021   GLUCOSE 97 02/21/2021   BUN 16 02/21/2021   CREATININE 1.06 (H) 02/21/2021   BILITOT <0.2 02/21/2021   ALKPHOS 103 02/21/2021   AST 19 02/21/2021   ALT 24 02/21/2021   PROT 7.4 02/21/2021   ALBUMIN 4.0 02/21/2021   CALCIUM 9.6 02/21/2021   ANIONGAP 5  02/13/2021   EGFR 60 02/21/2021   Lab Results  Component Value Date   CHOL 170 10/30/2020   Lab Results  Component Value Date   HDL 54 10/30/2020   Lab Results  Component Value Date   LDLCALC 103 (H) 10/30/2020   Lab Results  Component Value Date   TRIG 69 10/30/2020   Lab Results  Component Value Date   CHOLHDL 3.1 10/30/2020   Lab Results  Component Value Date   HGBA1C 6.6 (H) 02/02/2021      Assessment & Plan:   Problem List Items Addressed This Visit         Hospital discharge follow-up - Primary    Hospital chart reviewed including discharge summary S/p right nephrectomy PRBC transfusions for anemia Medications reconciled and reviewed with the patient      S/p nephrectomy    Biopsy showed clear cell RCC F/u with Urology        Other Visit Diagnoses    Microcytic anemia S/p PRBC transfusions in the hospital Started iron supplement  Relevant Medications  Iron, Ferrous Sulfate, 325 (65 Fe) MG TABS    Bee sting allergy       Relevant Medications   EPINEPHrine 0.3 mg/0.3 mL IJ SOAJ injection      Meds ordered this encounter  Medications  . Iron, Ferrous Sulfate, 325 (65 Fe) MG TABS    Sig: Take 325 mg by mouth daily.    Dispense:  30 tablet    Refill:  3  . EPINEPHrine 0.3 mg/0.3 mL IJ SOAJ injection    Sig: Inject 0.3 mg into the muscle as needed for anaphylaxis.      Dispense:  2 each    Refill:  0    Follow-up: Return if symptoms worsen or fail to improve.    Lindell Spar, MD

## 2021-02-22 NOTE — Assessment & Plan Note (Signed)
Biopsy showed clear cell RCC F/u with Urology

## 2021-02-22 NOTE — Patient Instructions (Signed)
Please continue taking medications as prescribed.  Please continue to follow up with Urology as scheduled.  Please start taking iron supplements - ferrous sulphate 325 mg once daily. Okay to take multivitamins.

## 2021-02-28 NOTE — Progress Notes (Signed)
Patient in office today for staple removal. 22 staples removed without difficulty and steri stripped. Incision intact with no s/s of infection.

## 2021-03-01 ENCOUNTER — Other Ambulatory Visit: Payer: Self-pay

## 2021-03-01 ENCOUNTER — Ambulatory Visit (INDEPENDENT_AMBULATORY_CARE_PROVIDER_SITE_OTHER): Payer: No Typology Code available for payment source

## 2021-03-01 DIAGNOSIS — Z905 Acquired absence of kidney: Secondary | ICD-10-CM | POA: Diagnosis not present

## 2021-03-07 NOTE — Progress Notes (Signed)
Results sent via my chart 

## 2021-03-08 ENCOUNTER — Other Ambulatory Visit: Payer: Self-pay

## 2021-03-08 ENCOUNTER — Ambulatory Visit (INDEPENDENT_AMBULATORY_CARE_PROVIDER_SITE_OTHER): Payer: No Typology Code available for payment source

## 2021-03-08 DIAGNOSIS — Z905 Acquired absence of kidney: Secondary | ICD-10-CM | POA: Diagnosis not present

## 2021-03-08 NOTE — Progress Notes (Signed)
Patient came today stating she found 1 staple remaining to her lower abd.  1 single staple seen and removed without difficulty. abd incision healing without any redness/drainage noted.  No complaints voiced by patient. Tolerated 1 staple removal without difficulty.

## 2021-04-18 ENCOUNTER — Other Ambulatory Visit: Payer: Self-pay

## 2021-04-18 ENCOUNTER — Ambulatory Visit: Payer: No Typology Code available for payment source | Admitting: Family Medicine

## 2021-04-18 ENCOUNTER — Encounter: Payer: Self-pay | Admitting: Family Medicine

## 2021-04-18 VITALS — BP 139/70 | HR 60 | Temp 98.4°F | Resp 20 | Ht 66.0 in | Wt 192.0 lb

## 2021-04-18 DIAGNOSIS — E782 Mixed hyperlipidemia: Secondary | ICD-10-CM

## 2021-04-18 DIAGNOSIS — I10 Essential (primary) hypertension: Secondary | ICD-10-CM | POA: Diagnosis not present

## 2021-04-18 DIAGNOSIS — C50912 Malignant neoplasm of unspecified site of left female breast: Secondary | ICD-10-CM

## 2021-04-18 DIAGNOSIS — E1159 Type 2 diabetes mellitus with other circulatory complications: Secondary | ICD-10-CM | POA: Diagnosis not present

## 2021-04-18 DIAGNOSIS — R3915 Urgency of urination: Secondary | ICD-10-CM | POA: Diagnosis not present

## 2021-04-18 DIAGNOSIS — Z23 Encounter for immunization: Secondary | ICD-10-CM

## 2021-04-18 DIAGNOSIS — E669 Obesity, unspecified: Secondary | ICD-10-CM

## 2021-04-18 DIAGNOSIS — E038 Other specified hypothyroidism: Secondary | ICD-10-CM

## 2021-04-18 DIAGNOSIS — D509 Iron deficiency anemia, unspecified: Secondary | ICD-10-CM

## 2021-04-18 DIAGNOSIS — F419 Anxiety disorder, unspecified: Secondary | ICD-10-CM | POA: Insufficient documentation

## 2021-04-18 DIAGNOSIS — E559 Vitamin D deficiency, unspecified: Secondary | ICD-10-CM | POA: Diagnosis not present

## 2021-04-18 LAB — POCT URINALYSIS DIPSTICK
Appearance: NORMAL
Bilirubin, UA: NEGATIVE
Blood, UA: NEGATIVE
Glucose, UA: NEGATIVE
Ketones, UA: NEGATIVE
Leukocytes, UA: NEGATIVE
Nitrite, UA: NEGATIVE
Odor: NORMAL
Protein, UA: NEGATIVE
Spec Grav, UA: 1.02 (ref 1.010–1.025)
Urobilinogen, UA: 0.2 E.U./dL
pH, UA: 5.5 (ref 5.0–8.0)

## 2021-04-18 MED ORDER — ALPRAZOLAM 0.25 MG PO TABS
ORAL_TABLET | ORAL | 0 refills | Status: DC
Start: 2021-04-18 — End: 2021-07-12

## 2021-04-18 NOTE — Progress Notes (Signed)
Deanna Bush     MRN: [id number]      DOB: March 12, 1961   HPI Deanna Bush is here for follow up and re-evaluation of chronic medical conditions, medication management and review of any available recent lab and radiology data.  Preventive health is updated, specifically  Cancer screening and Immunization.   Diagnosed with localized kidney cancer in March, and has ahd right nephrectomy in April, doing well post op. The PT denies any adverse reactions to current medications since the last visit.  Increased stress at home due to terminal illness of Deanna Bush  ROS Denies recent fever or chills. Denies sinus pressure, nasal congestion, ear pain or sore throat. Denies chest congestion, productive cough or wheezing. Denies chest pains, palpitations and leg swelling Denies abdominal pain, nausea, vomiting,diarrhea or constipation.   Denies dysuria, frequency, hesitancy or incontinence. Denies joint pain, swelling and limitation in mobility. Denies headaches, seizures, numbness, or tingling. Denies depression, anxiety or insomnia. Denies skin break down or rash. Requests mediction for anxiety as she will be flying in the next month, first time ever   PE  BP 139/70 (BP Location: Right Arm, Patient Position: Sitting, Cuff Size: Large)   Pulse 60   Temp 98.4 F (36.9 C)   Resp 20   Ht 5\' 6"  (1.676 m)   Wt 192 lb (87.1 kg)   SpO2 96%   BMI 30.99 kg/m   Patient alert and oriented and in no cardiopulmonary distress.  HEENT: No facial asymmetry, EOMI,     Neck supple .  Chest: Clear to auscultation bilaterally.  CVS: S1, S2 no murmurs, no S3.Regular rate.  ABD: Soft non tender. Well healed midline incision scar  Ext: No edema  MS: Adequate ROM spine, shoulders, hips and knees.  Skin: Intact, no ulcerations or rash noted.  Psych: Good eye contact, normal affect. Memory intact not anxious or depressed appearing.  CNS: CN 2-12 intact, power,  normal throughout.no focal  deficits noted.   Assessment & Plan Type 2 diabetes mellitus with vascular disease [facility]) Deanna Bush is reminded of the importance of commitment to daily physical activity for 30 minutes or more, as able and the need to limit carbohydrate intake to 30 to 60 grams per meal to help with blood sugar control.   The need to take medication as prescribed, test blood sugar as directed, and to call between visits if there is a concern that blood sugar is uncontrolled is also discussed.   Deanna Bush is reminded of the importance of daily foot exam, annual eye examination, and good blood sugar, blood pressure and cholesterol control.  Diabetic Labs Latest Ref Rng & Units 02/21/2021 02/13/2021 02/12/2021 02/11/2021 02/09/2021  HbA1c 4.8 - 5.6 % - - - - -  Microalbumin mg/dL - - - - -  Micro/Creat Ratio <30 mcg/mg creat - - - - -  Chol 100 - 199 mg/dL - - - - -  HDL >39 mg/dL - - - - -  Calc LDL 0 - 99 mg/dL - - - - -  Triglycerides 0 - 149 mg/dL - - - - -  Creatinine 0.57 - 1.00 mg/dL 1.06(H) 1.18(H) 1.07(H) 1.16(H) 1.09(H)   BP/Weight 04/18/2021 02/22/2021 02/21/2021 02/13/2021 02/02/2021 01/01/2021 10/12/2692  Systolic BP 854 627 035 009 381 829 937  Diastolic BP 70 66 72 62 82 69 78  Wt. (Lbs) 192 187 185.4 - 188 191 -  BMI 30.99 30.18 29.92 - 30.34 30.83 -   Foot/eye  exam completion dates Latest Ref Rng & Units 04/18/2021 10/29/2018  Eye Exam No Retinopathy - -  Foot Form Completion - Done Done      Updated lab needed at/ before next visit.   Hypothyroidism Followed by Endo  Malignant neoplasm of female breast [facility]) Mammogram past due , get asap  Mixed hyperlipidemia Hyperlipidemia:Low fat diet discussed and encouraged.   Lipid Panel  Lab Results  Component Value Date   CHOL 170 10/30/2020   HDL 54 10/30/2020   LDLCALC 103 (H) 10/30/2020   TRIG 69 10/30/2020   CHOLHDL 3.1 10/30/2020     Updated lab needed at/ before next visit.   Urinary tract infection without hematuria ccua in  office today is normal  Urinary urgency ccua in office today is normal  Obesity (BMI 30.0-34.9)  Patient re-educated about  the importance of commitment to a  minimum of 150 minutes of exercise per week as able.  The importance of healthy food choices with portion control discussed, as well as eating regularly and within a 12 hour window most days. The need to choose "clean , green" food 50 to 75% of the time is discussed, as well as to make water the primary drink and set a goal of 64 ounces water daily.    Weight /BMI 04/18/2021 02/22/2021 02/21/2021  WEIGHT 192 lb 187 lb 185 lb 6.4 oz  HEIGHT 5\' 6"  5\' 6"  5\' 6"   BMI 30.99 kg/m2 30.18 kg/m2 29.92 kg/m2      Anxiety Limited xanax for as needed use when flying

## 2021-04-18 NOTE — Assessment & Plan Note (Signed)
Limited xanax for as needed use when flying

## 2021-04-18 NOTE — Assessment & Plan Note (Signed)
Deanna Bush is reminded of the importance of commitment to daily physical activity for 30 minutes or more, as able and the need to limit carbohydrate intake to 30 to 60 grams per meal to help with blood sugar control.   The need to take medication as prescribed, test blood sugar as directed, and to call between visits if there is a concern that blood sugar is uncontrolled is also discussed.   Deanna Bush is reminded of the importance of daily foot exam, annual eye examination, and good blood sugar, blood pressure and cholesterol control.  Diabetic Labs Latest Ref Rng & Units 02/21/2021 02/13/2021 02/12/2021 02/11/2021 02/09/2021  HbA1c 4.8 - 5.6 % - - - - -  Microalbumin mg/dL - - - - -  Micro/Creat Ratio <30 mcg/mg creat - - - - -  Chol 100 - 199 mg/dL - - - - -  HDL >39 mg/dL - - - - -  Calc LDL 0 - 99 mg/dL - - - - -  Triglycerides 0 - 149 mg/dL - - - - -  Creatinine 0.57 - 1.00 mg/dL 1.06(H) 1.18(H) 1.07(H) 1.16(H) 1.09(H)   BP/Weight 04/18/2021 02/22/2021 02/21/2021 02/13/2021 02/02/2021 01/01/2021 5/84/8350  Systolic BP 757 322 567 209 198 022 179  Diastolic BP 70 66 72 62 82 69 78  Wt. (Lbs) 192 187 185.4 - 188 191 -  BMI 30.99 30.18 29.92 - 30.34 30.83 -   Foot/eye exam completion dates Latest Ref Rng & Units 04/18/2021 10/29/2018  Eye Exam No Retinopathy - -  Foot Form Completion - Done Done      Updated lab needed at/ before next visit.

## 2021-04-18 NOTE — Assessment & Plan Note (Signed)
Hyperlipidemia:Low fat diet discussed and encouraged.   Lipid Panel  Lab Results  Component Value Date   CHOL 170 10/30/2020   HDL 54 10/30/2020   LDLCALC 103 (H) 10/30/2020   TRIG 69 10/30/2020   CHOLHDL 3.1 10/30/2020     Updated lab needed at/ before next visit.

## 2021-04-18 NOTE — Assessment & Plan Note (Signed)
ccua in office today is normal

## 2021-04-18 NOTE — Patient Instructions (Addendum)
Follow-up with pap   Early September call if you need me sooner.  Pneumonia 23 vaccine today.  Microalbumin and clean-catch UA in office to be sent for culture if abnormal.  Good foot exam.  Please check blood pressure at home regularly, the goal is 130/80 or less.  Slightly elevated at visit this morning.  Please schedule mammogram at checkout.  Fasting lipid CMP and EGFR hemoglobin A1c CBC and iron to be done 3 days prior to next visit.  Limited Xanax is prescribed for use if needed when flying.  Thanks for choosing [facility], we consider it a privelige to serve you.

## 2021-04-18 NOTE — Assessment & Plan Note (Signed)
Followed by Endo 

## 2021-04-18 NOTE — Assessment & Plan Note (Signed)
  Patient re-educated about  the importance of commitment to a  minimum of 150 minutes of exercise per week as able.  The importance of healthy food choices with portion control discussed, as well as eating regularly and within a 12 hour window most days. The need to choose "clean , green" food 50 to 75% of the time is discussed, as well as to make water the primary drink and set a goal of 64 ounces water daily.    Weight /BMI 04/18/2021 02/22/2021 02/21/2021  WEIGHT 192 lb 187 lb 185 lb 6.4 oz  HEIGHT 5\' 6"  5\' 6"  5\' 6"   BMI 30.99 kg/m2 30.18 kg/m2 29.92 kg/m2

## 2021-04-18 NOTE — Assessment & Plan Note (Signed)
Mammogram past due , get asap

## 2021-04-20 LAB — MICROALBUMIN / CREATININE URINE RATIO
Creatinine, Urine: 93.1 mg/dL
Microalb/Creat Ratio: <3 mg/g creat (ref 0–29)
Microalbumin, Urine: <3 ug/mL

## 2021-04-25 ENCOUNTER — Ambulatory Visit (HOSPITAL_COMMUNITY)
Admission: RE | Admit: 2021-04-25 | Discharge: 2021-04-25 | Disposition: A | Discharge status: Home / Self Care | Payer: PRIVATE HEALTH INSURANCE | Source: Ambulatory Visit | Attending: Family Medicine | Admitting: Family Medicine

## 2021-04-25 ENCOUNTER — Other Ambulatory Visit: Payer: Self-pay

## 2021-04-25 DIAGNOSIS — Z1231 Encounter for screening mammogram for malignant neoplasm of breast: Secondary | ICD-10-CM

## 2021-05-25 ENCOUNTER — Other Ambulatory Visit (HOSPITAL_COMMUNITY): Payer: No Typology Code available for payment source

## 2021-05-28 ENCOUNTER — Ambulatory Visit (HOSPITAL_COMMUNITY)
Admission: RE | Admit: 2021-05-28 | Discharge: 2021-05-28 | Disposition: A | Discharge status: Home / Self Care | Payer: PRIVATE HEALTH INSURANCE | Source: Ambulatory Visit | Attending: Urology | Admitting: Urology

## 2021-05-28 ENCOUNTER — Other Ambulatory Visit: Payer: Self-pay

## 2021-05-28 DIAGNOSIS — Z905 Acquired absence of kidney: Secondary | ICD-10-CM | POA: Diagnosis not present

## 2021-05-28 LAB — POCT I-STAT CREATININE: Creatinine, Ser: 1.1 mg/dL — ABNORMAL HIGH (ref 0.44–1.00)

## 2021-05-28 MED ORDER — IOHEXOL 350 MG/ML SOLN
75.0000 mL | Freq: Once | INTRAVENOUS | Status: AC | PRN
  Administered 2021-05-28: 75 mL via INTRAVENOUS

## 2021-05-29 ENCOUNTER — Other Ambulatory Visit: Payer: No Typology Code available for payment source

## 2021-05-30 ENCOUNTER — Other Ambulatory Visit: Payer: Self-pay

## 2021-05-30 ENCOUNTER — Other Ambulatory Visit: Payer: No Typology Code available for payment source

## 2021-05-30 ENCOUNTER — Ambulatory Visit (HOSPITAL_COMMUNITY)
Admission: RE | Admit: 2021-05-30 | Discharge: 2021-05-30 | Disposition: A | Discharge status: Home / Self Care | Payer: PRIVATE HEALTH INSURANCE | Source: Ambulatory Visit | Attending: Urology | Admitting: Urology

## 2021-05-30 DIAGNOSIS — Z905 Acquired absence of kidney: Secondary | ICD-10-CM

## 2021-05-31 LAB — COMPREHENSIVE METABOLIC PANEL
ALT: 12 IU/L (ref 0–32)
AST: 12 IU/L (ref 0–40)
Albumin/Globulin Ratio: 1.6 (ref 1.2–2.2)
Albumin: 4.6 g/dL (ref 3.8–4.8)
Alkaline Phosphatase: 98 IU/L (ref 44–121)
BUN/Creatinine Ratio: 18 (ref 12–28)
BUN: 19 mg/dL (ref 8–27)
Bilirubin Total: <0.2 mg/dL (ref 0.0–1.2)
CO2: 24 mmol/L (ref 20–29)
Calcium: 9.5 mg/dL (ref 8.7–10.3)
Chloride: 104 mmol/L (ref 96–106)
Creatinine, Ser: 1.04 mg/dL — ABNORMAL HIGH (ref 0.57–1.00)
Globulin, Total: 2.8 g/dL (ref 1.5–4.5)
Glucose: 101 mg/dL — ABNORMAL HIGH (ref 65–99)
Potassium: 4.7 mmol/L (ref 3.5–5.2)
Sodium: 141 mmol/L (ref 134–144)
Total Protein: 7.4 g/dL (ref 6.0–8.5)
eGFR: 61 mL/min/{1.73_m2} (ref >59)

## 2021-06-01 ENCOUNTER — Ambulatory Visit: Payer: No Typology Code available for payment source | Admitting: Urology

## 2021-06-14 NOTE — Progress Notes (Signed)
Sent via mychart and mail. 

## 2021-06-18 ENCOUNTER — Ambulatory Visit: Payer: No Typology Code available for payment source | Admitting: Family Medicine

## 2021-06-20 ENCOUNTER — Telehealth (INDEPENDENT_AMBULATORY_CARE_PROVIDER_SITE_OTHER): Payer: No Typology Code available for payment source | Admitting: Urology

## 2021-06-20 ENCOUNTER — Other Ambulatory Visit: Payer: Self-pay

## 2021-06-20 DIAGNOSIS — C649 Malignant neoplasm of unspecified kidney, except renal pelvis: Secondary | ICD-10-CM | POA: Insufficient documentation

## 2021-06-20 DIAGNOSIS — C641 Malignant neoplasm of right kidney, except renal pelvis: Secondary | ICD-10-CM

## 2021-06-20 NOTE — Progress Notes (Signed)
Patient rescheduled

## 2021-06-22 ENCOUNTER — Ambulatory Visit (INDEPENDENT_AMBULATORY_CARE_PROVIDER_SITE_OTHER): Payer: No Typology Code available for payment source | Admitting: Urology

## 2021-06-22 ENCOUNTER — Encounter: Payer: Self-pay | Admitting: Urology

## 2021-06-22 ENCOUNTER — Other Ambulatory Visit: Payer: Self-pay

## 2021-06-22 VITALS — BP 146/71 | HR 64

## 2021-06-22 DIAGNOSIS — C641 Malignant neoplasm of right kidney, except renal pelvis: Secondary | ICD-10-CM | POA: Diagnosis not present

## 2021-06-22 NOTE — Progress Notes (Signed)
06/22/2021 9:00 AM   Deanna Bush [birth year] [id number]  Referring provider: Fayrene Helper, MD [address] [place],  [place] [address]  Followup right RCC   HPI: Deanna Bush is a 60yo here for followup for right renal RCC. She underwent nephrectomy in 02/2021. CT 05/30/2021 shows multiple small nodules in the nephrectomy bed. Creatinine 1.04. Energy is good. Appetite is good. NO LUTS and no hematuria. 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    Cellulitis of leg, left    Complication of anesthesia    Hard to wake up   Diabetes mellitus without complication ([place])    Pt denies   Family history of breast cancer 08/16/2011   Hyperlipidemia    Hypertension    Hypothyroidism    MRSA (methicillin resistant staph aureus) culture positive 08/19/2011   Pre-diabetes     Surgical History: Past Surgical History:  Procedure Laterality Date   BREAST SURGERY Left    mastectomy   CESAREAN SECTION     x2   COLONOSCOPY N/A 03/03/2020   Procedure: COLONOSCOPY;  Surgeon: Daneil Dolin, MD;  Location: [facility];  Service: Endoscopy;  Laterality: N/A;  12:00   left mastectomy     NEPHRECTOMY Right 02/06/2021   Procedure: NEPHRECTOMY- open radical;  Surgeon: Cleon Gustin, MD;  Location: [facility];  Service: Urology;  Laterality: Right;   POLYPECTOMY  03/03/2020   Procedure: POLYPECTOMY;  Surgeon: Daneil Dolin, MD;  Location: [facility];  Service: Endoscopy;;    Home Medications:  Allergies as of 06/22/2021       Reactions   Sulfonamide Derivatives Itching   Yellow Jacket Venom [bee Venom] Rash   urticaria        Medication List        Accurate as of June 22, 2021  9:00 AM. If you have any questions, ask your nurse or doctor.          ALPRAZolam 0.25 MG tablet Commonly known as: XANAX Take half a tablet by mouth 30 minutes prior to flying and you may repeat this 1 time  only  30 minutes after first tablet .   amLODipine 5 MG tablet Commonly known as: NORVASC Take 1 tablet (5 mg total) by mouth daily.   atorvastatin 10 MG tablet Commonly known as: LIPITOR TAKE ONE TABLET BY MOUTH ONCE DAILY What changed:  how much to take how to take this when to take this additional instructions   EPINEPHrine 0.3 mg/0.3 mL Soaj injection Commonly known as: EPI-PEN Inject 0.3 mg into the muscle as needed for anaphylaxis.   [place] (Ferrous Sulfate) 325 (65 Fe) MG Tabs Take 325 mg by mouth daily.   levothyroxine 88 MCG tablet Commonly known as: SYNTHROID TAKE 1 TABLET(88 MCG) BY MOUTH DAILY BEFORE BREAKFAST What changed: See the new instructions.   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        Allergies:  Allergies  Allergen Reactions   Sulfonamide Derivatives Itching   Yellow Jacket Venom [Bee Venom] Rash    urticaria    Family History: Family History  Problem Relation Age of Onset   Hypertension Mother    Diabetes Mother    Hyperlipidemia Mother    COPD Brother    Cancer Sister  breast   Cancer Sister        breast    Social History:  reports that she quit smoking about 18 years ago. Her smoking use included cigarettes. She has a 9.00 pack-year smoking history. She has never used smokeless tobacco. She reports that she does not drink alcohol and does not use drugs.  ROS: All other review of systems were reviewed and are negative except what is noted above in HPI  Physical Exam: BP (!) 146/71   Pulse 64   Constitutional:  Alert and oriented, No acute distress. HEENT: [place] AT, moist mucus membranes.  Trachea midline, no masses. Cardiovascular: No clubbing, cyanosis, or edema. Respiratory: Normal respiratory effort, no increased work of breathing. GI: Abdomen is soft, nontender, nondistended, no abdominal masses GU: No CVA tenderness.  Lymph: No cervical or  inguinal lymphadenopathy. Skin: No rashes, bruises or suspicious lesions. Neurologic: Grossly intact, no focal deficits, moving all 4 extremities. Psychiatric: Normal mood and affect.  Laboratory Data: Lab Results  Component Value Date   WBC 6.8 02/21/2021   HGB 8.9 (L) 02/21/2021   HCT 28.1 (L) 02/21/2021   MCV 87 02/21/2021   PLT 409 02/21/2021    Lab Results  Component Value Date   CREATININE 1.04 (H) 05/30/2021    No results found for: PSA  No results found for: TESTOSTERONE  Lab Results  Component Value Date   HGBA1C 6.6 (H) 02/02/2021    Urinalysis    Component Value Date/Time   COLORURINE RED (A) 12/23/2020 1939   APPEARANCEUR Clear 02/21/2021 1127   LABSPEC 1.002 (L) 08/25/2020 2020   PHURINE 7.0 08/25/2020 2020   GLUCOSEU Negative 02/21/2021 1127   HGBUR (A) 12/23/2020 1939    TEST NOT REPORTED DUE TO COLOR INTERFERENCE OF URINE PIGMENT   BILIRUBINUR neg 04/18/2021 0855   BILIRUBINUR Negative 02/21/2021 1127   KETONESUR (A) 12/23/2020 1939    TEST NOT REPORTED DUE TO COLOR INTERFERENCE OF URINE PIGMENT   PROTEINUR Negative 04/18/2021 0855   PROTEINUR Negative 02/21/2021 1127   PROTEINUR (A) 12/23/2020 1939    TEST NOT REPORTED DUE TO COLOR INTERFERENCE OF URINE PIGMENT   UROBILINOGEN 0.2 04/18/2021 0855   NITRITE neg 04/18/2021 0855   NITRITE Negative 02/21/2021 1127   NITRITE (A) 12/23/2020 1939    TEST NOT REPORTED DUE TO COLOR INTERFERENCE OF URINE PIGMENT   LEUKOCYTESUR Negative 04/18/2021 0855   LEUKOCYTESUR Negative 02/21/2021 1127   LEUKOCYTESUR (A) 12/23/2020 1939    TEST NOT REPORTED DUE TO COLOR INTERFERENCE OF URINE PIGMENT    Lab Results  Component Value Date   LABMICR <3.0 04/18/2021   WBCUA 0-5 02/21/2021   LABEPIT >10 (A) 02/21/2021   BACTERIA Few (A) 02/21/2021    Pertinent Imaging: CT 05/28/2021: Images reviewed and discussed with the patient No results found for this or any previous visit.  No results found for this or  any previous visit.  No results found for this or any previous visit.  No results found for this or any previous visit.  No results found for this or any previous visit.  No results found for this or any previous visit.  No results found for this or any previous visit.  Results for orders placed during the hospital encounter of 12/23/20  CT RENAL STONE STUDY  Narrative CLINICAL DATA:  60 year old female with flank pain. Concern for kidney stone.  EXAM: CT ABDOMEN AND PELVIS WITHOUT CONTRAST  TECHNIQUE: Multidetector CT imaging of the abdomen and pelvis was  performed following the standard protocol without IV contrast.  COMPARISON:  None.  FINDINGS: Evaluation of this exam is limited in the absence of intravenous contrast.  Lower chest: Bibasilar linear atelectasis/scarring. There is a 3 mm right lower lobe nodule (8/4). The visualized lung bases are otherwise clear. There is hypoattenuation of the cardiac blood pool suggestive of anemia. Clinical correlation is recommended.  No intra-abdominal free air or free fluid.  Hepatobiliary: Apparent infiltration of the right renal mass into the posterior right lobe of the liver (segment VII). The liver is otherwise unremarkable. No intrahepatic biliary dilatation. No calcified gallstone or pericholecystic fluid.  Pancreas: Unremarkable. No pancreatic ductal dilatation or surrounding inflammatory changes.  Spleen: Normal in size without focal abnormality.  Adrenals/Urinary Tract: The adrenal glands unremarkable. There is a solid predominantly hypoattenuating right renal upper pole mass measuring approximately 6 x 9 cm in greatest axial dimensions and 11 cm in craniocaudal length most consistent with malignancy. Further characterization with renal mass protocol MRI on a nonemergent/outpatient basis recommended. There is superior extension of the mass with apparent invasion into the right lobe of the liver. There is  probable extension of the mass into the right renal pelvis with a degree of obstruction and mild right hydronephrosis. There is no hydronephrosis or nephrolithiasis on the left. The visualized ureters and urinary bladder appear unremarkable.  Stomach/Bowel: There is moderate stool throughout the colon. No bowel obstruction or active inflammation. The appendix is normal.  Vascular/Lymphatic: Mild aortoiliac atherosclerotic disease. The IVC is unremarkable. Evaluation for possible tumor thrombus is limited on this noncontrast CT. CT with IV contrast may provide better evaluation. No portal venous gas. No adenopathy.  Reproductive: The uterus is anteverted and grossly unremarkable. No adnexal masses.  Other: Left mastectomy with partially visualized prosthesis.  Musculoskeletal: Degenerative changes of the spine. No acute osseous pathology.  IMPRESSION: 1. Large right renal upper pole mass most consistent with malignancy. CT with IV contrast may provide better evaluation of potential tumor thrombus. 2. Mild right hydronephrosis secondary to extension of the mass into the right renal pelvis. 3. No bowel obstruction. Normal appendix. 4. A 3 mm right lower lobe nodule. 5. Aortic Atherosclerosis (ICD10-I70.0).   Electronically Signed By: Anner Crete M.D. On: 12/24/2020 00:49   Assessment & Plan:    1. Renal cell carcinoma of right kidney (HCC) RTC 3 monbths with CMP and CT abd/pelvis   No follow-ups on file.  Nicolette Bang, MD  [facility] [place]

## 2021-06-22 NOTE — Patient Instructions (Signed)
Kidney Cancer  Kidney cancer is an abnormal growth of cells in one or both kidneys. The kidneys filter waste from your blood and produce urine. Kidney cancer may spread to other parts of your body. This type of cancer may also be calledrenal cell carcinoma. What are the causes? The cause of this condition is not always known. In some cases, abnormal changes to genes (genetic mutations) can cause cells to form cancer. What increases the risk? You may be more likely to develop kidney cancer if you: Are over age 60. The risk increases with age. Have a family history of kidney cancer. Are of African-American, Native American, or Native [place] descent. Smoke. Are female. Are obese. Have high blood pressure (hypertension). Have advanced kidney disease, especially if you need long-term dialysis. Have certain conditions that are passed from parent to child (inherited), such as von Hippel-Lindau disease, tuberous sclerosis, or hereditary papillary renal carcinoma. Have been exposed to certain chemicals. What are the signs or symptoms? In the early stages, kidney cancer does not cause symptoms. As the cancer grows, symptoms may include: Blood in the urine. Pain in the upper back or abdomen, just below the rib cage. You may feel pain on one or both sides of the body. Fatigue. Unexplained weight loss. Fever. How is this diagnosed? This condition may be diagnosed based on: Your symptoms and medical history. A physical exam. Blood and urine tests. X-rays. Imaging tests, such as CT scans, MRIs, and PET scans. Having dye injected into your blood through an IV, and then having X-rays taken of: Your kidneys and the rest of the organs involved in making and storing urine (intravenous pyelogram). Your blood vessels (angiogram). Removal and testing of a kidney tissue sample (biopsy). Your cancer will be assessed (staged), based on how severe it is and how much it has spread. How is this  treated? Treatment depends on the type and stage of the cancer. Treatment may include one or more of the following: Surgery. This may include surgery to remove: Just the tumor (nephron-sparing surgery). The entire kidney (nephrectomy). The kidney, some of the surrounding healthy tissue, nearby lymph nodes, and the adrenal gland in certain cases (radical nephrectomy). Medicines that kill cancer cells (chemotherapy). High-energy rays that kill cancer cells (radiation therapy). Targeted therapy. This targets specific parts of cancer cells and the area around them to block the growth and the spread of the cancer. Targeted therapy can help to limit the damage to healthy cells. Medicines that help your body's disease-fighting system (immune system) fight cancer cells (immunotherapy). Freezing cancer cells using gas or liquid that is delivered through a needle (cryoablation). Destroying cancer cells using high-energy radio waves that are delivered through a needle-like probe (radiofrequency ablation). A procedure to block the artery that supplies blood to the tumor, which kills the cancer cells (embolization). Follow these instructions at home: Eating and drinking Some of your treatments might affect your appetite and your ability to chew and swallow. If you are having problems eating, or if you do not have an appetite, meet with a diet and nutrition specialist (dietitian). If you have side effects that affect eating, it may help to: Eat smaller meals and snacks often. Drink high-nutrition and high-calorie shakes or supplements. Eat bland and soft foods that are easy to eat. Not eat foods that are hot, spicy, or hard to swallow. Lifestyle Do not drink alcohol. Do not use any products that contain nicotine or tobacco, such as cigarettes and e-cigarettes. If you need help   quitting, ask your health care provider. General instructions  Take over-the-counter and prescription medicines only as told by  your health care provider. This includes vitamins, supplements, and herbal products. Consider joining a support group to help you cope with the stress of having kidney cancer. Work with your health care provider to manage any side effects of treatment. Keep all follow-up visits as told by your health care provider. This is important.  Where to find more information American Cancer Society: https://www.cancer.org National Cancer Institute (NCI): https://www.cancer.gov Contact a health care provider if you: Notice that you bruise or bleed easily. Are losing weight without trying. Have new or increased fatigue or weakness. Get help right away if you have: Blood in your urine. A sudden increase in pain. A fever. Shortness of breath. Chest pain. Yellow skin or whites of your eyes (jaundice). Summary Kidney cancer is an abnormal growth of cells (tumor) in one or both kidneys. Tumors may spread to other parts of your body. In the early stages, kidney cancer does not cause symptoms. As the cancer grows, symptoms may include blood in the urine, pain in the upper back or abdomen, unexplained weight loss, fatigue, and fever. Treatment depends on the type and stage of the cancer. It may include surgery to remove the tumor, procedures and medicines to kill the cancer cells, or medicines to help your body fight cancer cells. This information is not intended to replace advice given to you by your health care provider. Make sure you discuss any questions you have with your healthcare provider. Document Revised: 10/15/2017 Document Reviewed: 10/12/2017 Elsevier Patient Education  2022 Elsevier Inc.  

## 2021-07-12 ENCOUNTER — Encounter: Payer: Self-pay | Admitting: Family Medicine

## 2021-07-12 ENCOUNTER — Other Ambulatory Visit: Payer: Self-pay

## 2021-07-12 ENCOUNTER — Ambulatory Visit (INDEPENDENT_AMBULATORY_CARE_PROVIDER_SITE_OTHER): Payer: No Typology Code available for payment source | Admitting: Family Medicine

## 2021-07-12 VITALS — BP 138/80 | HR 68 | Resp 16 | Ht 66.0 in | Wt 198.1 lb

## 2021-07-12 DIAGNOSIS — Z23 Encounter for immunization: Secondary | ICD-10-CM

## 2021-07-12 DIAGNOSIS — E1159 Type 2 diabetes mellitus with other circulatory complications: Secondary | ICD-10-CM

## 2021-07-12 DIAGNOSIS — F320 Major depressive disorder, single episode, mild: Secondary | ICD-10-CM | POA: Insufficient documentation

## 2021-07-12 DIAGNOSIS — E890 Postprocedural hypothyroidism: Secondary | ICD-10-CM

## 2021-07-12 DIAGNOSIS — E785 Hyperlipidemia, unspecified: Secondary | ICD-10-CM

## 2021-07-12 DIAGNOSIS — C641 Malignant neoplasm of right kidney, except renal pelvis: Secondary | ICD-10-CM

## 2021-07-12 DIAGNOSIS — E038 Other specified hypothyroidism: Secondary | ICD-10-CM

## 2021-07-12 DIAGNOSIS — E669 Obesity, unspecified: Secondary | ICD-10-CM

## 2021-07-12 DIAGNOSIS — F419 Anxiety disorder, unspecified: Secondary | ICD-10-CM | POA: Diagnosis not present

## 2021-07-12 LAB — LIPID PANEL
Chol/HDL Ratio: 2.9 ratio (ref 0.0–4.4)
Cholesterol, Total: 175 mg/dL (ref 100–199)
HDL: 60 mg/dL (ref >39)
LDL Chol Calc (NIH): 101 mg/dL — ABNORMAL HIGH (ref 0–99)
Triglycerides: 76 mg/dL (ref 0–149)
VLDL Cholesterol Cal: 14 mg/dL (ref 5–40)

## 2021-07-12 LAB — CBC WITH DIFFERENTIAL/PLATELET
Basophils Absolute: 0 10*3/uL (ref 0.0–0.2)
Basos: 0 %
EOS (ABSOLUTE): 0 10*3/uL (ref 0.0–0.4)
Eos: 0 %
Hematocrit: 33.7 % — ABNORMAL LOW (ref 34.0–46.6)
Hemoglobin: 11.1 g/dL (ref 11.1–15.9)
Immature Grans (Abs): 0 10*3/uL (ref 0.0–0.1)
Immature Granulocytes: 0 %
Lymphocytes Absolute: 2.5 10*3/uL (ref 0.7–3.1)
Lymphs: 43 %
MCH: 29.1 pg (ref 26.6–33.0)
MCHC: 32.9 g/dL (ref 31.5–35.7)
MCV: 89 fL (ref 79–97)
Monocytes Absolute: 0.4 10*3/uL (ref 0.1–0.9)
Monocytes: 8 %
Neutrophils Absolute: 2.8 10*3/uL (ref 1.4–7.0)
Neutrophils: 49 %
Platelets: 374 10*3/uL (ref 150–450)
RBC: 3.81 x10E6/uL (ref 3.77–5.28)
RDW: 13 % (ref 11.7–15.4)
WBC: 5.8 10*3/uL (ref 3.4–10.8)

## 2021-07-12 LAB — CMP14+EGFR
ALT: 12 IU/L (ref 0–32)
AST: 16 IU/L (ref 0–40)
Albumin/Globulin Ratio: 1.5 (ref 1.2–2.2)
Albumin: 4.4 g/dL (ref 3.8–4.8)
Alkaline Phosphatase: 98 IU/L (ref 44–121)
BUN/Creatinine Ratio: 17 (ref 12–28)
BUN: 18 mg/dL (ref 8–27)
Bilirubin Total: 0.3 mg/dL (ref 0.0–1.2)
CO2: 22 mmol/L (ref 20–29)
Calcium: 9.8 mg/dL (ref 8.7–10.3)
Chloride: 101 mmol/L (ref 96–106)
Creatinine, Ser: 1.05 mg/dL — ABNORMAL HIGH (ref 0.57–1.00)
Globulin, Total: 3 g/dL (ref 1.5–4.5)
Glucose: 82 mg/dL (ref 70–99)
Potassium: 4.6 mmol/L (ref 3.5–5.2)
Sodium: 138 mmol/L (ref 134–144)
Total Protein: 7.4 g/dL (ref 6.0–8.5)
eGFR: 60 mL/min/{1.73_m2} (ref >59)

## 2021-07-12 LAB — IRON: Iron: 91 ug/dL (ref 27–139)

## 2021-07-12 LAB — HEMOGLOBIN A1C
Est. average glucose Bld gHb Est-mCnc: 146 mg/dL
Hgb A1c MFr Bld: 6.7 % — ABNORMAL HIGH (ref 4.8–5.6)

## 2021-07-12 LAB — MICROALBUMIN, URINE: Microalbumin, Urine: <3 ug/mL

## 2021-07-12 MED ORDER — PAROXETINE HCL 10 MG PO TABS: 10.0000 mg | ORAL_TABLET | Freq: Every day | ORAL | 3 refills | Status: DC

## 2021-07-12 NOTE — Patient Instructions (Signed)
F/U with pap in 6 weeks , re eval chronic health, call if you need me  before   TDaP today  New is paxil daily and you are referred to Therapy    It is important that you exercise regularly at least 30 minutes 5 times a week. If you develop chest pain, have severe difficulty breathing, or feel very tired, stop exercising immediately and seek medical attention   Think about what you will eat, plan ahead. Choose " clean, green, fresh or frozen" over canned, processed or packaged foods which are more sugary, salty and fatty. 70 to 75% of food eaten should be vegetables and fruit. Three meals at set times with snacks allowed between meals, but they must be fruit or vegetables. Aim to eat over a 12 hour period , example 7 am to 7 pm, and STOP after  your last meal of the day. Drink water,generally about 64 ounces per day, no other drink is as healthy. Fruit juice is best enjoyed in a healthy way, by EATING the fruit. Thanks for choosing [facility], we consider it a privelige to serve you.

## 2021-07-15 ENCOUNTER — Encounter: Payer: Self-pay | Admitting: Family Medicine

## 2021-07-15 NOTE — Assessment & Plan Note (Signed)
Increased anxiety because of multiply dx of cancer both personal and in close family members over the past 9 months, start paxil and refer to therapy

## 2021-07-15 NOTE — Assessment & Plan Note (Signed)
Managed by Endo 

## 2021-07-15 NOTE — Assessment & Plan Note (Signed)
Hyperlipidemia:Low fat diet discussed and encouraged.   Lipid Panel  Lab Results  Component Value Date   CHOL 175 07/10/2021   HDL 60 07/10/2021   LDLCALC 101 (H) 07/10/2021   TRIG 76 07/10/2021   CHOLHDL 2.9 07/10/2021     Need to reduce fat in diet

## 2021-07-15 NOTE — Assessment & Plan Note (Signed)
Related to life stresses, dx this year with kidney cancer, stepdaughter currently being treated for metastatic cancer, a sibling recently dx with cancer Feels stressed and overwhelmed at times, interested in therapy, also willing to take medication. Not suicidal or homicdal Start paxil and refer to Therapy. Reports excellent support from her spouse

## 2021-07-15 NOTE — Assessment & Plan Note (Signed)
Deanna Bush is reminded of the importance of commitment to daily physical activity for 30 minutes or more, as able and the need to limit carbohydrate intake to 30 to 60 grams per meal to help with blood sugar control.   Deanna Bush is reminded of the importance of daily foot exam, annual eye examination, and good blood sugar, blood pressure and cholesterol control.  Diabetic Labs Latest Ref Rng & Units 07/10/2021 05/30/2021 05/28/2021 04/18/2021 02/21/2021  HbA1c 4.8 - 5.6 % 6.7(H) - - - -  Microalbumin mg/dL - - - - -  Micro/Creat Ratio 0 - 29 mg/g creat - - - <3 -  Chol 100 - 199 mg/dL 175 - - - -  HDL >39 mg/dL 60 - - - -  Calc LDL 0 - 99 mg/dL 101(H) - - - -  Triglycerides 0 - 149 mg/dL 76 - - - -  Creatinine 0.57 - 1.00 mg/dL 1.05(H) 1.04(H) 1.10(H) - 1.06(H)   BP/Weight 07/12/2021 06/22/2021 04/18/2021 02/22/2021 02/21/2021 02/13/2021 3/90/3009  Systolic BP 233 007 622 633 354 562 563  Diastolic BP 80 71 70 66 72 62 82  Wt. (Lbs) 198.12 - 192 187 185.4 - 188  BMI 31.98 - 30.99 30.18 29.92 - 30.34   Foot/eye exam completion dates Latest Ref Rng & Units 04/18/2021 10/29/2018  Eye Exam No Retinopathy - -  Foot Form Completion - Done Done

## 2021-07-15 NOTE — Assessment & Plan Note (Signed)
  Patient re-educated about  the importance of commitment to a  minimum of 150 minutes of exercise per week as able.  The importance of healthy food choices with portion control discussed, as well as eating regularly and within a 12 hour window most days. The need to choose "clean , green" food 50 to 75% of the time is discussed, as well as to make water the primary drink and set a goal of 64 ounces water daily.    Weight /BMI 07/12/2021 04/18/2021 02/22/2021  WEIGHT 198 lb 1.9 oz 192 lb 187 lb  HEIGHT 5\' 6"  5\' 6"  5\' 6"   BMI 31.98 kg/m2 30.99 kg/m2 30.18 kg/m2

## 2021-07-15 NOTE — Assessment & Plan Note (Signed)
S/p right nephrectomy, closely followed by Urology

## 2021-07-15 NOTE — Progress Notes (Signed)
Deanna Bush     MRN: [id number]      DOB: September 13, 1961   HPI Deanna Bush is here for follow up and re-evaluation of chronic medical conditions, medication management and review of any available recent lab and radiology data.  Preventive health is updated, specifically  Cancer screening and Immunization.   Questions or concerns regarding consultations or procedures which the PT has had in the interim are  addressed. The PT denies any adverse reactions to current medications since the last visit.  There are no new concerns.  There are no specific complaints   ROS Denies recent fever or chills. Denies sinus pressure, nasal congestion, ear pain or sore throat. Denies chest congestion, productive cough or wheezing. Denies chest pains, palpitations and leg swelling Denies abdominal pain, nausea, vomiting,diarrhea or constipation.   Denies dysuria, frequency, hesitancy or incontinence. Denies joint pain, swelling and limitation in mobility. Denies headaches, seizures, numbness, or tingling. Denies depression, anxiety or insomnia. Denies skin break down or rash.   PE  BP 138/80   Pulse 68   Resp 16   Ht 5\' 6"  (1.676 m)   Wt 198 lb 1.9 oz (89.9 kg)   SpO2 96%   BMI 31.98 kg/m   Patient alert and oriented and in no cardiopulmonary distress.  HEENT: No facial asymmetry, EOMI,     Neck supple .  Chest: Clear to auscultation bilaterally.  CVS: S1, S2 no murmurs, no S3.Regular rate.  ABD: Soft non tender.   Ext: No edema  MS: Adequate ROM spine, shoulders, hips and knees.  Skin: Intact, no ulcerations or rash noted.  Psych: Good eye contact, normal affect. Memory intact mildly  anxious , at times tearful,  mildly  depressed appearing.  CNS: CN 2-12 intact, power,  normal throughout.no focal deficits noted.   Assessment & Plan  Depression, major, single episode, mild (HCC) Related to life stresses, dx this year with kidney cancer, stepdaughter currently being treated for  metastatic cancer, a sibling recently dx with cancer Feels stressed and overwhelmed at times, interested in therapy, also willing to take medication. Not suicidal or homicdal Start paxil and refer to Therapy. Reports excellent support from her spouse  Anxiety Increased anxiety because of multiply dx of cancer both personal and in close family members over the past 9 months, start paxil and refer to therapy  Hypothyroidism Managed by Endo  Hyperlipidemia LDL goal <100 Hyperlipidemia:Low fat diet discussed and encouraged.   Lipid Panel  Lab Results  Component Value Date   CHOL 175 07/10/2021   HDL 60 07/10/2021   LDLCALC 101 (H) 07/10/2021   TRIG 76 07/10/2021   CHOLHDL 2.9 07/10/2021     Need to reduce fat in diet  Hypothyroidism following radioiodine therapy Managed by Endo  Obesity (BMI 30.0-34.9)  Patient re-educated about  the importance of commitment to a  minimum of 150 minutes of exercise per week as able.  The importance of healthy food choices with portion control discussed, as well as eating regularly and within a 12 hour window most days. The need to choose "clean , green" food 50 to 75% of the time is discussed, as well as to make water the primary drink and set a goal of 64 ounces water daily.    Weight /BMI 07/12/2021 04/18/2021 02/22/2021  WEIGHT 198 lb 1.9 oz 192 lb 187 lb  HEIGHT 5\' 6"  5\' 6"  5\' 6"   BMI 31.98 kg/m2 30.99 kg/m2 30.18 kg/m2      Type  2 diabetes mellitus with vascular disease [facility]) Deanna Bush is reminded of the importance of commitment to daily physical activity for 30 minutes or more, as able and the need to limit carbohydrate intake to 30 to 60 grams per meal to help with blood sugar control.   Deanna Bush is reminded of the importance of daily foot exam, annual eye examination, and good blood sugar, blood pressure and cholesterol control.  Diabetic Labs Latest Ref Rng & Units 07/10/2021 05/30/2021 05/28/2021 04/18/2021 02/21/2021  HbA1c 4.8 - 5.6  % 6.7(H) - - - -  Microalbumin mg/dL - - - - -  Micro/Creat Ratio 0 - 29 mg/g creat - - - <3 -  Chol 100 - 199 mg/dL 175 - - - -  HDL >39 mg/dL 60 - - - -  Calc LDL 0 - 99 mg/dL 101(H) - - - -  Triglycerides 0 - 149 mg/dL 76 - - - -  Creatinine 0.57 - 1.00 mg/dL 1.05(H) 1.04(H) 1.10(H) - 1.06(H)   BP/Weight 07/12/2021 06/22/2021 04/18/2021 02/22/2021 02/21/2021 02/13/2021 9/50/7225  Systolic BP 750 518 335 825 189 842 103  Diastolic BP 80 71 70 66 72 62 82  Wt. (Lbs) 198.12 - 192 187 185.4 - 188  BMI 31.98 - 30.99 30.18 29.92 - 30.34   Foot/eye exam completion dates Latest Ref Rng & Units 04/18/2021 10/29/2018  Eye Exam No Retinopathy - -  Foot Form Completion - Done Done        Renal cell carcinoma of right kidney [facility]) S/p right nephrectomy, closely followed by Urology

## 2021-07-24 ENCOUNTER — Ambulatory Visit: Payer: No Typology Code available for payment source | Admitting: Urology

## 2021-07-26 ENCOUNTER — Telehealth: Payer: No Typology Code available for payment source | Admitting: Licensed Clinical Social Worker

## 2021-07-26 ENCOUNTER — Other Ambulatory Visit: Payer: Self-pay

## 2021-07-26 ENCOUNTER — Ambulatory Visit: Payer: No Typology Code available for payment source | Admitting: Licensed Clinical Social Worker

## 2021-07-31 ENCOUNTER — Other Ambulatory Visit: Payer: Self-pay | Admitting: Internal Medicine

## 2021-07-31 DIAGNOSIS — I10 Essential (primary) hypertension: Secondary | ICD-10-CM

## 2021-08-01 ENCOUNTER — Other Ambulatory Visit: Payer: Self-pay

## 2021-08-01 ENCOUNTER — Ambulatory Visit (INDEPENDENT_AMBULATORY_CARE_PROVIDER_SITE_OTHER): Payer: No Typology Code available for payment source | Admitting: Licensed Clinical Social Worker

## 2021-08-01 DIAGNOSIS — F320 Major depressive disorder, single episode, mild: Secondary | ICD-10-CM

## 2021-08-08 ENCOUNTER — Telehealth (INDEPENDENT_AMBULATORY_CARE_PROVIDER_SITE_OTHER): Payer: No Typology Code available for payment source | Admitting: Licensed Clinical Social Worker

## 2021-08-08 DIAGNOSIS — F419 Anxiety disorder, unspecified: Secondary | ICD-10-CM

## 2021-08-08 DIAGNOSIS — F320 Major depressive disorder, single episode, mild: Secondary | ICD-10-CM

## 2021-08-08 NOTE — BH Specialist Note (Signed)
[place] Initial TeleMedicine Clinical Assessment  MRN: [id number] NAME: DESIREY KEAHEY Date: 08/08/21  Start time: 130p End time: 145p Total time: 15  Types of Service: Telephone visit Referring Provider: Dr. Moshe Cipro Reason for Visit today: anxiety and sadness  Patient/Family location: home [place] Provider location: home office All persons participating in visit: yes  I connected with patient and/or family via Telephone or Video Enabled Telemedicine Application  (Video is Caregility application) and verified that I am speaking with the correct person using two identifiers.   Discussed confidentiality: Yes   I discussed the limitations of telemedicine and the availability of in person appointments.  Discussed there is a possibility of technology failure and discussed alternative modes of communication if that failure occurs.  I discussed that engaging in this telemedicine visit, they consent to the provision of behavioral healthcare and the services will be billed under their insurance.  Patient and/or legal guardian expressed understanding and consented to Telemedicine visit: Yes   Treatment History Patient recently received Inpatient Treatment: No  Patient currently being seen by therapist/psychiatrist: No  Patient currently receiving the following services: no  Past Psychiatric History/Diagnosis/Hospitalization(s): Anxiety: No Bipolar Disorder: No Depression: No Mania: No Psychosis: No Schizophrenia: No Personality Disorder: No Hospitalization for psychiatric illness: No History of Electroconvulsive Shock Therapy: No Prior Suicide Attempts: No  Decreased need for sleep: No  Euphoria: No  Self Injurious behaviors: No  Family History of mental illness: No  Family History of substance abuse: No  Substance Abuse: No  DUI: No  Insomnia: No   History of violence: No  Physical, sexual or emotional abuse: No  Prior outpatient mental health  therapy: No   Clinical Assessment  Depression screen [facility] 2/9 07/12/2021 07/12/2021 04/18/2021 02/22/2021 12/11/2020  Decreased Interest 1 0 0 0 0  Down, Depressed, Hopeless 1 0 0 0 0  PHQ - 2 Score 2 0 0 0 0  Altered sleeping 3 - - - -  Tired, decreased energy 1 - - - -  Change in appetite 1 - - - -  Feeling bad or failure about yourself  1 - - - -  Trouble concentrating 0 - - - -  Moving slowly or fidgety/restless 0 - - - -  Suicidal thoughts 0 - - - -  PHQ-9 Score 8 - - - -  Difficult doing work/chores - - - - -  Some recent data might be hidden      Social Functioning Social maturity: wnl Social judgement: wnl  Stress Current stressors: recently DX Familial stressors: recent DX Sleep: fair Appetite: fair Coping ability: overwhelmed Patient taking medications as prescribed:  yes  Current medications:  Outpatient Encounter Medications as of 08/01/2021  Medication Sig   amLODipine (NORVASC) 5 MG tablet TAKE 1 TABLET (5 MG TOTAL) BY MOUTH DAILY.   atorvastatin (LIPITOR) 10 MG tablet TAKE ONE TABLET BY MOUTH ONCE DAILY (Patient taking differently: Take 10 mg by mouth daily.)   EPINEPHrine 0.3 mg/0.3 mL IJ SOAJ injection Inject 0.3 mg into the muscle as needed for anaphylaxis.   Iron, Ferrous Sulfate, 325 (65 Fe) MG TABS Take 325 mg by mouth daily.   levothyroxine (SYNTHROID) 88 MCG tablet TAKE 1 TABLET(88 MCG) BY MOUTH DAILY BEFORE BREAKFAST (Patient taking differently: Take 88 mcg by mouth daily before breakfast.)   PARoxetine (PAXIL) 10 MG tablet Take 1 tablet (10 mg total) by mouth daily.   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   No facility-administered encounter medications on file as of 08/01/2021.     Self-harm and/or Suicidal Behaviors Risk Assessment Self-harm risk factors: no Patient endorses recent self injurious thoughts and/or behaviors: No  Suicide ideations: No plan to harm self or  others   Danger to Others Risk Assessment Danger to others risk factors: no Patient endorses recent thoughts of harming others: No    Substance Use Assessment Patient recently consumed alcohol: No  Patient recently used drugs: No  Patient is concerned about dependence or abuse of substances: No    Goals, Interventions and Follow-up Plan Goals: Increase healthy adjustment to current life circumstances Interventions: Mindfulness or Relaxation Training and CBT Cognitive Behavioral Therapy Follow-up Plan:  Biweekly VBH session  Summary of Clinical Assessment Summary:Avaley is a referral by her PCP.  She reports that she is sad and isolates herself due to current life stressors.  She has been diagnosed with kidney cancer and her family (sibling and stepdaughter) has been diagnosed with cancer as well.  On most days she is overwhelmed, stressed, recurrent thought of cancer and the changes that she will go through.  She has some anxiousness as well.  She recently began paxil and wants biweekly therapy.   Lubertha South, LCSW

## 2021-08-08 NOTE — BH Specialist Note (Signed)
Virtual Behavioral Health Treatment Plan Team Note  MRN: [id number] NAME: Deanna Bush  DATE: 08/10/21  Start time:   345pEnd time:  350p Total time:  5 min  Total number of Virtual [place] Treatment Team Plan encounters: 1/4  Treatment Team Attendees: Royal Piedra, LCSW & Dr. Modesta Messing  Diagnoses:    ICD-10-CM   1. Depression, major, single episode, mild ([place])  F32.0     2. Anxiety  F41.9       Goals, Interventions and Follow-up Plan Goals: Increase healthy adjustment to current life circumstances Interventions: Mindfulness or Relaxation Training CBT Cognitive Behavioral Therapy Medication Management Recommendations: no change in medication Follow-up Plan: Biweekly VBH session  History of the present illness Presenting Problem/Current Symptoms: symptoms of diagnosis  Psychiatric History  Depression: No Anxiety: No Mania: No Psychosis: No PTSD symptoms: No  Past Psychiatric History/Hospitalization(s): Hospitalization for psychiatric illness: No Prior Suicide Attempts: No Prior Self-injurious behavior: No  Psychosocial stressors  medical Self-harm Behaviors Risk Assessment none   Screenings PHQ-9 Assessments:  Depression screen [facility] 2/9 07/12/2021 07/12/2021 04/18/2021  Decreased Interest 1 0 0  Down, Depressed, Hopeless 1 0 0  PHQ - 2 Score 2 0 0  Altered sleeping 3 - -  Tired, decreased energy 1 - -  Change in appetite 1 - -  Feeling bad or failure about yourself  1 - -  Trouble concentrating 0 - -  Moving slowly or fidgety/restless 0 - -  Suicidal thoughts 0 - -  PHQ-9 Score 8 - -  Difficult doing work/chores - - -  Some recent data might be hidden   GAD-7 Assessments:  GAD 7 : Generalized Anxiety Score 07/12/2021  Nervous, Anxious, on Edge 1  Control/stop worrying 2  Worry too much - different things 1  Trouble relaxing 2  Restless 1  Easily annoyed or irritable 1  Afraid - awful might happen 2  Total GAD 7 Score 10    Past Medical History Past  Medical History:  Diagnosis Date   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  Allergies:  Allergies as of 08/08/2021 - Review Complete 07/15/2021  Allergen Reaction Noted   Sulfonamide derivatives Itching    Yellow jacket venom [bee venom] Rash 04/02/2015    Medication History Current medications:  Outpatient Encounter Medications as of 08/08/2021  Medication Sig   amLODipine (NORVASC) 5 MG tablet TAKE 1 TABLET (5 MG TOTAL) BY MOUTH DAILY.   atorvastatin (LIPITOR) 10 MG tablet TAKE ONE TABLET BY MOUTH ONCE DAILY (Patient taking differently: Take 10 mg by mouth daily.)   EPINEPHrine 0.3 mg/0.3 mL IJ SOAJ injection Inject 0.3 mg into the muscle as needed for anaphylaxis.   Iron, Ferrous Sulfate, 325 (65 Fe) MG TABS Take 325 mg by mouth daily.   levothyroxine (SYNTHROID) 88 MCG tablet TAKE 1 TABLET(88 MCG) BY MOUTH DAILY BEFORE BREAKFAST (Patient taking differently: Take 88 mcg by mouth daily before breakfast.)   PARoxetine (PAXIL) 10 MG tablet Take 1 tablet (10 mg total) by mouth daily.  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   No facility-administered encounter medications on file as of 08/08/2021.     Scribe for Treatment Team: Lubertha South, LCSW

## 2021-08-08 NOTE — Progress Notes (Signed)
[facility] Health Initiative ([place]) Psychiatric Consultant Case Review   Deanna Bush is a 60 y.o. year old female with a history of depression, anxiety, renal cell carcinoma s/p, nephrectomy in 02/2021, hypothyroidism, hyperlipidemia.  Her sister is suffering from cancer.  She does office work.  Worsening mood symptoms in the context of RCC.  Paxil was started in October.   Assessment/Provisional Diagnosis # MDD # r/o adjustment disorder Will continue current dose of Paxil given it was recently started.   Recommendation  -Continue Paxil 10 mg daily -Make referral to IOP if she is interested -[facility] specialist to offer supportive therapy  Thank you for your consult. We will continue to follow the patient. Please contact [facility]  for any questions or concerns.   The above treatment considerations and suggestions are based on consultation with the [facility] specialist and/or PCP and a review of information available in the shared registry and the patient's [place] Record (EHR). I have not personally examined the patient. All recommendations should be implemented with consideration of the patient's relevant prior history and current clinical status. Please feel free to call me with any questions about the care of this patient.

## 2021-08-15 ENCOUNTER — Ambulatory Visit (INDEPENDENT_AMBULATORY_CARE_PROVIDER_SITE_OTHER): Payer: No Typology Code available for payment source | Admitting: Licensed Clinical Social Worker

## 2021-08-15 ENCOUNTER — Other Ambulatory Visit: Payer: Self-pay

## 2021-08-15 DIAGNOSIS — F320 Major depressive disorder, single episode, mild: Secondary | ICD-10-CM

## 2021-08-22 NOTE — BH Specialist Note (Signed)
[place] Follow Up Assessment  MRN: [id number] NAME: IVIONA HOLE Date: 08/22/21  Start time: 1130 End time: 1749 Total time: 15  Type of Contact: Follow up Call  Current concerns/stressors: medical  Screens/Assessment Tools:  GAD 7 : Generalized Anxiety Score 07/12/2021  Nervous, Anxious, on Edge 1  Control/stop worrying 2  Worry too much - different things 1  Trouble relaxing 2  Restless 1  Easily annoyed or irritable 1  Afraid - awful might happen 2  Total GAD 7 Score 10     PHQ9 SCORE ONLY 07/12/2021 07/12/2021 04/18/2021  PHQ-9 Total Score 8 0 0     Functional Assessment:  Sleep: poor Appetite: poor Coping ability: overwhelmed Patient taking medications as prescribed:    Current medications:  Outpatient Encounter Medications as of 08/15/2021  Medication Sig   amLODipine (NORVASC) 5 MG tablet TAKE 1 TABLET (5 MG TOTAL) BY MOUTH DAILY.   atorvastatin (LIPITOR) 10 MG tablet TAKE ONE TABLET BY MOUTH ONCE DAILY (Patient taking differently: Take 10 mg by mouth daily.)   EPINEPHrine 0.3 mg/0.3 mL IJ SOAJ injection Inject 0.3 mg into the muscle as needed for anaphylaxis.   Iron, Ferrous Sulfate, 325 (65 Fe) MG TABS Take 325 mg by mouth daily.   levothyroxine (SYNTHROID) 88 MCG tablet TAKE 1 TABLET(88 MCG) BY MOUTH DAILY BEFORE BREAKFAST (Patient taking differently: Take 88 mcg by mouth daily before breakfast.)   PARoxetine (PAXIL) 10 MG tablet Take 1 tablet (10 mg total) by mouth daily.   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   No facility-administered encounter medications on file as of 08/15/2021.    Self-harm and/or Suicidal Behaviors Risk Assessment Self-harm risk factors: no Patient endorses recent self injurious thoughts and/or behaviors: No   Suicide ideations: Plan to harm others     Danger to Others Risk Assessment Danger to others risk factors: no Patient endorses recent  thoughts of harming others: No    Substance Use Assessment Patient recently consumed alcohol: No  Patient recently used drugs: No  Patient is concerned about dependence or abuse of substances: No    Goals, Interventions and Follow-up Plan Goals: Increase healthy adjustment to current life circumstances Interventions: Mindfulness or Relaxation Training   Summary of Clinical Assessment  Sanae is a 60 yr old woman who presents for follow up.  She continues to have difficulty with her mood (sadness) and has difficulty concentrating.  Factors that contribute to client's ongoing depressive symptoms were discussed and include real and perceived feelings of isolation, criticism, rejection, shame and guilt.     Follow-up Plan:  Biweekly VBH sessions Lubertha South, LCSW

## 2021-08-23 ENCOUNTER — Encounter: Payer: Self-pay | Admitting: Family Medicine

## 2021-08-23 ENCOUNTER — Other Ambulatory Visit: Payer: Self-pay

## 2021-08-23 ENCOUNTER — Other Ambulatory Visit (HOSPITAL_COMMUNITY)
Admission: RE | Admit: 2021-08-23 | Discharge: 2021-08-23 | Disposition: A | Discharge status: Home / Self Care | Payer: PRIVATE HEALTH INSURANCE | Source: Ambulatory Visit | Attending: Family Medicine | Admitting: Family Medicine

## 2021-08-23 ENCOUNTER — Ambulatory Visit (INDEPENDENT_AMBULATORY_CARE_PROVIDER_SITE_OTHER): Payer: No Typology Code available for payment source | Admitting: Family Medicine

## 2021-08-23 VITALS — BP 129/64 | HR 58 | Resp 16 | Ht 66.0 in | Wt 199.1 lb

## 2021-08-23 DIAGNOSIS — N760 Acute vaginitis: Secondary | ICD-10-CM

## 2021-08-23 DIAGNOSIS — I10 Essential (primary) hypertension: Secondary | ICD-10-CM | POA: Diagnosis not present

## 2021-08-23 DIAGNOSIS — E1159 Type 2 diabetes mellitus with other circulatory complications: Secondary | ICD-10-CM

## 2021-08-23 DIAGNOSIS — F320 Major depressive disorder, single episode, mild: Secondary | ICD-10-CM

## 2021-08-23 DIAGNOSIS — E785 Hyperlipidemia, unspecified: Secondary | ICD-10-CM | POA: Diagnosis not present

## 2021-08-23 DIAGNOSIS — E890 Postprocedural hypothyroidism: Secondary | ICD-10-CM

## 2021-08-23 DIAGNOSIS — Z01419 Encounter for gynecological examination (general) (routine) without abnormal findings: Secondary | ICD-10-CM

## 2021-08-23 DIAGNOSIS — Z124 Encounter for screening for malignant neoplasm of cervix: Secondary | ICD-10-CM

## 2021-08-23 DIAGNOSIS — E669 Obesity, unspecified: Secondary | ICD-10-CM

## 2021-08-23 NOTE — Assessment & Plan Note (Signed)
  Patient re-educated about  the importance of commitment to a  minimum of 150 minutes of exercise per week as able.  The importance of healthy food choices with portion control discussed, as well as eating regularly and within a 12 hour window most days. The need to choose "clean , green" food 50 to 75% of the time is discussed, as well as to make water the primary drink and set a goal of 64 ounces water daily.    Weight /BMI 08/23/2021 07/12/2021 04/18/2021  WEIGHT 199 lb 1.9 oz 198 lb 1.9 oz 192 lb  HEIGHT 5\' 6"  5\' 6"  5\' 6"   BMI 32.14 kg/m2 31.98 kg/m2 30.99 kg/m2

## 2021-08-23 NOTE — Assessment & Plan Note (Signed)
Exam as documented, specimen sent for STD testing per pt request, also pap sent

## 2021-08-23 NOTE — Assessment & Plan Note (Signed)
Followed by Endo 

## 2021-08-23 NOTE — Assessment & Plan Note (Signed)
Deanna Bush is reminded of the importance of commitment to daily physical activity for 30 minutes or more, as able and the need to limit carbohydrate intake to 30 to 60 grams per meal to help with blood sugar control.   The need to take medication as prescribed, test blood sugar as directed, and to call between visits if there is a concern that blood sugar is uncontrolled is also discussed.   Deanna Bush is reminded of the importance of daily foot exam, annual eye examination, and good blood sugar, blood pressure and cholesterol control.  Diabetic Labs Latest Ref Rng & Units 07/10/2021 05/30/2021 05/28/2021 04/18/2021 02/21/2021  HbA1c 4.8 - 5.6 % 6.7(H) - - - -  Microalbumin mg/dL - - - - -  Micro/Creat Ratio 0 - 29 mg/g creat - - - <3 -  Chol 100 - 199 mg/dL 175 - - - -  HDL >39 mg/dL 60 - - - -  Calc LDL 0 - 99 mg/dL 101(H) - - - -  Triglycerides 0 - 149 mg/dL 76 - - - -  Creatinine 0.57 - 1.00 mg/dL 1.05(H) 1.04(H) 1.10(H) - 1.06(H)   BP/Weight 08/23/2021 07/12/2021 06/22/2021 04/18/2021 02/22/2021 02/21/2021 0/37/0488  Systolic BP 891 694 503 888 280 034 917  Diastolic BP 64 80 71 70 66 72 62  Wt. (Lbs) 199.12 198.12 - 192 187 185.4 -  BMI 32.14 31.98 - 30.99 30.18 29.92 -   Foot/eye exam completion dates Latest Ref Rng & Units 04/18/2021 10/29/2018  Eye Exam No Retinopathy - -  Foot Form Completion - Done Done

## 2021-08-23 NOTE — Progress Notes (Signed)
Deanna Bush     MRN: [id number]      DOB: Mar 14, 1961   HPI Deanna Bush is here for follow up and re-evaluation of chronic medical conditions, medication management and review of any available recent lab and radiology data.  Preventive health is updated, specifically  Cancer screening and Immunization.  Pp past due and is done , als testing for STD per pt request Questions or concerns regarding consultations or procedures which the PT has had in the interim are  addressed. The PT denies any adverse reactions to current medications since the last visit.  Filled but has not taking any paxil, sates s/e scared hr, and las missed therapist's call. Doing better, daughter improved  ROS Denies recent fever or chills. Denies sinus pressure, nasal congestion, ear pain or sore throat. Denies chest congestion, productive cough or wheezing. Denies chest pains, palpitations and leg swelling Denies abdominal pain, nausea, vomiting,diarrhea or constipation.   Denies dysuria, frequency, hesitancy or incontinence. Denies joint pain, swelling and limitation in mobility. Denies headaches, seizures, numbness, or tingling. Denies depression, anxiety or insomnia. Denies skin break down or rash.   PE  BP 129/64   Pulse (!) 58   Resp 16   Ht 5\' 6"  (1.676 m)   Wt 199 lb 1.9 oz (90.3 kg)   SpO2 95%   BMI 32.14 kg/m   Patient alert and oriented and in no cardiopulmonary distress.  HEENT: No facial asymmetry, EOMI,     Neck supple .  Chest: Clear to auscultation bilaterally.  CVS: S1, S2 no murmurs, no S3.Regular rate.  ABD: Soft non tender.  Pelvic: Uterus not enlargd, no palpable adnexal mass, no, physiologic appearing discharge from os, vaginal walls moist. No cervical motion or adnexal tenderness Ext: No edema  MS: Adequate ROM spine, shoulders, hips and knees.  Skin: Intact, no ulcerations or rash noted.  Psych: Good eye contact, normal affect. Memory intact not anxious or depressed  appearing.  CNS: CN 2-12 intact, power,  normal throughout.no focal deficits noted.   Assessment & Plan  Type 2 diabetes mellitus with vascular disease [facility]) Deanna Bush is reminded of the importance of commitment to daily physical activity for 30 minutes or more, as able and the need to limit carbohydrate intake to 30 to 60 grams per meal to help with blood sugar control.   The need to take medication as prescribed, test blood sugar as directed, and to call between visits if there is a concern that blood sugar is uncontrolled is also discussed.   Deanna Bush is reminded of the importance of daily foot exam, annual eye examination, and good blood sugar, blood pressure and cholesterol control.  Diabetic Labs Latest Ref Rng & Units 07/10/2021 05/30/2021 05/28/2021 04/18/2021 02/21/2021  HbA1c 4.8 - 5.6 % 6.7(H) - - - -  Microalbumin mg/dL - - - - -  Micro/Creat Ratio 0 - 29 mg/g creat - - - <3 -  Chol 100 - 199 mg/dL 175 - - - -  HDL >39 mg/dL 60 - - - -  Calc LDL 0 - 99 mg/dL 101(H) - - - -  Triglycerides 0 - 149 mg/dL 76 - - - -  Creatinine 0.57 - 1.00 mg/dL 1.05(H) 1.04(H) 1.10(H) - 1.06(H)   BP/Weight 08/23/2021 07/12/2021 06/22/2021 04/18/2021 02/22/2021 02/21/2021 1/66/0630  Systolic BP 160 109 323 557 322 025 427  Diastolic BP 64 80 71 70 66 72 62  Wt. (Lbs) 199.12 198.12 - 192 187 185.4 -  BMI 32.14 31.98 - 30.99 30.18 29.92 -   Foot/eye exam completion dates Latest Ref Rng & Units 04/18/2021 10/29/2018  Eye Exam No Retinopathy - -  Foot Form Completion - Done Done        Encounter for cervical Pap smear with pelvic exam Exam as documented, specimen sent for STD testing per pt request, also pap sent  Depression, major, single episode, mild ([facility]) Resolved , no medication taken and no therapy session will call if needed, doing better  Hypothyroidism following radioiodine therapy Followed by Endo  Obesity (BMI 30.0-34.9)  Patient re-educated about  the importance of commitment to a   minimum of 150 minutes of exercise per week as able.  The importance of healthy food choices with portion control discussed, as well as eating regularly and within a 12 hour window most days. The need to choose "clean , green" food 50 to 75% of the time is discussed, as well as to make water the primary drink and set a goal of 64 ounces water daily.    Weight /BMI 08/23/2021 07/12/2021 04/18/2021  WEIGHT 199 lb 1.9 oz 198 lb 1.9 oz 192 lb  HEIGHT 5\' 6"  5\' 6"  5\' 6"   BMI 32.14 kg/m2 31.98 kg/m2 30.99 kg/m2

## 2021-08-23 NOTE — Patient Instructions (Addendum)
F/U in mid March, call if you need me sooner  Please check  blood pressure at home weekly, if you getover 140 as the higher numbner often, make a sooner appointment  Pap and cultures sent  HBA1C, fasting lipid , cmp and EGFR, 3 to 5 days before next visit  It is important that you exercise regularly at least 30 minutes 5 times a week. If you develop chest pain, have severe difficulty breathing, or feel very tired, stop exercising immediately and seek medical attention   Think about what you will eat, plan ahead. Choose " clean, green, fresh or frozen" over canned, processed or packaged foods which are more sugary, salty and fatty. 70 to 75% of food eaten should be vegetables and fruit. Three meals at set times with snacks allowed between meals, but they must be fruit or vegetables. Aim to eat over a 12 hour period , example 7 am to 7 pm, and STOP after  your last meal of the day. Drink water,generally about 64 ounces per day, no other drink is as healthy. Fruit juice is best enjoyed in a healthy way, by EATING the fruit.  Thanks for choosing [facility], we consider it a privelige to serve you.

## 2021-08-23 NOTE — Assessment & Plan Note (Signed)
Resolved , no medication taken and no therapy session will call if needed, doing better

## 2021-08-28 ENCOUNTER — Other Ambulatory Visit: Payer: Self-pay | Admitting: "Endocrinology

## 2021-08-28 DIAGNOSIS — E890 Postprocedural hypothyroidism: Secondary | ICD-10-CM

## 2021-08-29 LAB — T4, FREE: Free T4: 1.44 ng/dL (ref 0.82–1.77)

## 2021-08-29 LAB — TSH: TSH: 0.98 u[IU]/mL (ref 0.450–4.500)

## 2021-08-31 LAB — CERVICOVAGINAL ANCILLARY ONLY
Bacterial Vaginitis (gardnerella): POSITIVE — AB
Candida Glabrata: NEGATIVE
Candida Vaginitis: NEGATIVE
Chlamydia: NEGATIVE
Comment: NEGATIVE
Comment: NEGATIVE
Comment: NEGATIVE
Comment: NEGATIVE
Comment: NEGATIVE
Comment: NORMAL
Neisseria Gonorrhea: NEGATIVE
Trichomonas: NEGATIVE

## 2021-08-31 MED ORDER — METRONIDAZOLE 500 MG PO TABS: 500.0000 mg | ORAL_TABLET | Freq: Two times a day (BID) | ORAL | 0 refills | Status: DC

## 2021-08-31 NOTE — Progress Notes (Signed)
metronidaz

## 2021-09-04 LAB — CYTOLOGY - PAP
Adequacy: ABSENT
Comment: NEGATIVE
Diagnosis: NEGATIVE
Diagnosis: REACTIVE
High risk HPV: NEGATIVE

## 2021-09-06 ENCOUNTER — Ambulatory Visit: Payer: No Typology Code available for payment source | Admitting: "Endocrinology

## 2021-09-11 ENCOUNTER — Other Ambulatory Visit: Payer: Self-pay

## 2021-09-11 ENCOUNTER — Encounter: Payer: Self-pay | Admitting: "Endocrinology

## 2021-09-11 ENCOUNTER — Ambulatory Visit (INDEPENDENT_AMBULATORY_CARE_PROVIDER_SITE_OTHER): Payer: No Typology Code available for payment source | Admitting: "Endocrinology

## 2021-09-11 VITALS — BP 122/72 | HR 70 | Ht 66.0 in | Wt 194.4 lb

## 2021-09-11 DIAGNOSIS — E890 Postprocedural hypothyroidism: Secondary | ICD-10-CM

## 2021-09-11 DIAGNOSIS — E782 Mixed hyperlipidemia: Secondary | ICD-10-CM | POA: Diagnosis not present

## 2021-09-11 DIAGNOSIS — E119 Type 2 diabetes mellitus without complications: Secondary | ICD-10-CM

## 2021-09-11 NOTE — Progress Notes (Signed)
09/11/2021, 8:23 AM  Endocrinology follow-up note   Deanna Bush is a 60 y.o.-year-old female patient being seen for follow-up after she was seen in consultation for hypothyroidism. PMD:  Fayrene Helper, MD.   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    Cellulitis of leg, left    Complication of anesthesia    Hard to wake up   Diabetes mellitus without complication ([place])    Pt denies   Family history of breast cancer 08/16/2011   Hyperlipidemia    Hypertension    Hypothyroidism    MRSA (methicillin resistant staph aureus) culture positive 08/19/2011   Pre-diabetes     Past Surgical History:  Procedure Laterality Date   BREAST SURGERY Left    mastectomy   CESAREAN SECTION     x2   COLONOSCOPY N/A 03/03/2020   Procedure: COLONOSCOPY;  Surgeon: Daneil Dolin, MD;  [facility];  Service: Endoscopy;  Laterality: N/A;  12:00   left mastectomy     NEPHRECTOMY Right 02/06/2021   Procedure: NEPHRECTOMY- open radical;  Surgeon: Cleon Gustin, MD;  Location[facility];  Service: Urology;  Laterality: Right;   POLYPECTOMY  03/03/2020   Procedure: POLYPECTOMY;  Surgeon: Daneil Dolin, MD;  [facility];  Service: Endoscopy;;    Social History   Socioeconomic History   Marital status: Married    Spouse name: Not on file   Number of children: 2   Years of education: Not on file   Highest education level: Not on file  Occupational History   Occupation: CNA  Tobacco Use   Smoking status: Former    Packs/day: 0.50    Years: 18.00    Pack years: 9.00    Types: Cigarettes    Quit date: 07/01/2002    Years since quitting: 19.2   Smokeless tobacco: Never  Vaping Use   Vaping Use: Never used  Substance and Sexual Activity   Alcohol use: No   Drug use: No   Sexual activity: Not Currently  Other Topics Concern   Not on file  Social  History Narrative   Not on file   Social Determinants of Health   Financial Resource Strain: Not on file  Food Insecurity: Not on file  Transportation Needs: Not on file  Physical Activity: Not on file  Stress: Not on file  Social Connections: Not on file    Family History  Problem Relation Age of Onset   Hypertension Mother    Diabetes Mother    Hyperlipidemia Mother    COPD Brother    Cancer Sister        breast   Cancer Sister        breast    Outpatient Encounter Medications as of 09/11/2021  Medication Sig   amLODipine (NORVASC) 5 MG tablet TAKE 1 TABLET (5 MG TOTAL) BY MOUTH DAILY.   atorvastatin (LIPITOR) 10 MG tablet TAKE ONE TABLET BY MOUTH ONCE DAILY (Patient taking differently: Take 10 mg  by mouth daily.)   EPINEPHrine 0.3 mg/0.3 mL IJ SOAJ injection Inject 0.3 mg into the muscle as needed for anaphylaxis.   Iron, Ferrous Sulfate, 325 (65 Fe) MG TABS Take 325 mg by mouth daily.   levothyroxine (SYNTHROID) 88 MCG tablet TAKE 1 TABLET(88 MCG) BY MOUTH DAILY BEFORE BREAKFAST (Patient taking differently: Take 88 mcg by mouth daily before breakfast.)   UNABLE TO FIND MASTECTOMY BRA AND PROTHESIS DX Z90.12   UNABLE TO FIND 6 mastectomy prosthesis and bras.   metroNIDAZOLE (FLAGYL) 500 MG tablet Take 1 tablet (500 mg total) by mouth 2 (two) times daily. (Patient not taking: Reported on 09/11/2021)   No facility-administered encounter medications on file as of 09/11/2021.    ALLERGIES: Allergies  Allergen Reactions   Sulfonamide Derivatives Itching   Yellow Jacket Venom [Bee Venom] Rash    urticaria   VACCINATION STATUS: Immunization History  Administered Date(s) Administered   Influenza Split 08/06/2012   Influenza,inj,Quad PF,6+ Mos 07/22/2018, 06/28/2019, 06/28/2020, 07/10/2021   Moderna Sars-Covid-2 Vaccination 12/07/2019, 01/07/2020, 08/09/2020, 04/05/2021   Pneumococcal Conjugate-13 03/29/2015   Pneumococcal Polysaccharide-23 04/18/2021   Tdap 03/12/2011,  07/12/2021   Zoster Recombinat (Shingrix) 05/05/2018, 07/02/2018     HPI    Deanna Bush  is a patient with the above medical history.  Her history starts at approximate age of 60 when she had Graves' disease which required RAI thyroid ablation.  She is currently on levothyroxine 88 mcg p.o. daily before breakfast.    She reports compliance to his medication.  She has no new complaints today.  Since her last visit, she underwent right nephrectomy for renal cell carcinoma.  Her previsit thyroid function tests are consistent with appropriate replacement.     She denies fatigue, palpitations, tremors, heat/cold intolerance. -She reports fluctuating body weight. -She denies dysphagia, shortness of breath, no voice change.  She underwent thyroid ultrasound recently which did not show significant goiter , nor any discrete thyroid nodules.    she has family history of  thyroid disorders in her mother, but no family history of thyroid cancer.  -She exercises regularly, non-smoker.  Her medical history also includes hyperlipidemia for which she is taking atorvastatin 10 mg p.o. nightly. -She has history of diet/exercise controlled type 2 diabetes with recent A1c of 6.7%.   She also has hyperlipidemia on management with PMD.   ROS:  Limited as above.   Physical Exam: BP 122/72   Pulse 70   Ht 5\' 6"  (1.676 m)   Wt 194 lb 6.4 oz (88.2 kg)   BMI 31.38 kg/m  Wt Readings from Last 3 Encounters:  09/11/21 194 lb 6.4 oz (88.2 kg)  08/23/21 199 lb 1.9 oz (90.3 kg)  07/12/21 198 lb 1.9 oz (89.9 kg)    Constitutional:  Body mass index is 31.38 kg/m., not in acute distress, normal state of mind   CMP ( most recent) CMP     Component Value Date/Time   NA 138 07/10/2021 1530   K 4.6 07/10/2021 1530   CL 101 07/10/2021 1530   CO2 22 07/10/2021 1530   GLUCOSE 82 07/10/2021 1530   GLUCOSE 101 (H) 02/13/2021 0342   BUN 18 07/10/2021 1530   CREATININE 1.05 (H) 07/10/2021 1530   CREATININE  0.73 04/22/2019 0848   CALCIUM 9.8 07/10/2021 1530   PROT 7.4 07/10/2021 1530   ALBUMIN 4.4 07/10/2021 1530   AST 16 07/10/2021 1530   ALT 12 07/10/2021 1530   ALKPHOS 98 07/10/2021 1530  BILITOT 0.3 07/10/2021 1530   GFRNONAA 53 (L) 02/13/2021 0342   GFRNONAA 90 04/22/2019 0848   GFRAA 96 10/30/2020 0914   GFRAA 104 04/22/2019 0848     Diabetic Labs (most recent): Lab Results  Component Value Date   HGBA1C 6.7 (H) 07/10/2021   HGBA1C 6.6 (H) 02/02/2021   HGBA1C 6.4 (H) 10/30/2020     Lipid Panel ( most recent) Lipid Panel     Component Value Date/Time   CHOL 175 07/10/2021 1530   TRIG 76 07/10/2021 1530   HDL 60 07/10/2021 1530   CHOLHDL 2.9 07/10/2021 1530   CHOLHDL 2.7 10/23/2018 1148   VLDL 7 08/12/2016 0913   LDLCALC 101 (H) 07/10/2021 1530   LDLCALC 100 (H) 10/23/2018 1148   LABVLDL 14 07/10/2021 1530       Lab Results  Component Value Date   TSH 0.980 08/28/2021   TSH 0.657 10/30/2020   TSH 1.060 09/04/2020   TSH 8.37 (H) 03/28/2020   TSH 30.55 (H) 01/03/2020   TSH 0.054 (L) 11/15/2019   TSH 5.03 (H) 04/22/2019   TSH 3.27 10/23/2018   TSH 0.34 (L) 05/05/2018   TSH 0.10 (L) 03/16/2018   FREET4 1.44 08/28/2021   FREET4 1.30 09/04/2020   FREET4 1.1 03/28/2020   FREET4 0.7 (L) 01/03/2020       ASSESSMENT: 1. Hypothyroidism   2.  Type 2 diabetes  3.  Hyperlipidemia  PLAN:  -Her previsit thyroid function tests are consistent with appropriate replacement.  She is advised to continue levothyroxine 88 mg p.o. daily before breakfast.     - We discussed about the correct intake of her thyroid hormone, on empty stomach at fasting, with water, separated by at least 30 minutes from breakfast and other medications,  and separated by more than 4 hours from calcium, iron, multivitamins, acid reflux medications (PPIs). -Patient is made aware of the fact that thyroid hormone replacement is needed for life, dose to be adjusted by periodic monitoring of thyroid  function tests.   -Her recent thyroid ultrasound was reviewed, no goiter, no discrete nodules. -She has controlled type 2 diabetes with A1c of 6.7%.  She is not on medications.  She promises to continue to engage in lifestyle changes.   She will have repeat A1c during her next visit.  She is advised to continue her atorvastatin and milligrams p.o. nightly. She is advised to maintain close follow-up with her PMD Dr. Tula Nakayama.     I spent 21 minutes in the care of the patient today including review of labs from Thyroid Function, CMP, and other relevant labs ; imaging/biopsy records (current and previous including abstractions from other facilities); face-to-face time discussing  her lab results and symptoms, medications doses, her options of short and long term treatment based on the latest standards of care / guidelines;   and documenting the encounter.  [name]  participated in the discussions, expressed understanding, and voiced agreement with the above plans.  All questions were answered to her satisfaction. she is encouraged to contact clinic should she have any questions or concerns prior to her return visit.    Return in about 6 months (around 03/12/2022) for F/U with Pre-visit Labs, A1c -NV.  Glade Lloyd, MD [facility] [address] [place], [place] [address] Phone: [phone]  Fax: [phone]   09/11/2021, 8:23 AM  This note was partially dictated with voice recognition software. Similar sounding words can be transcribed inadequately or  may not  be corrected upon review.

## 2021-09-12 ENCOUNTER — Other Ambulatory Visit: Payer: Self-pay | Admitting: "Endocrinology

## 2021-09-12 DIAGNOSIS — E890 Postprocedural hypothyroidism: Secondary | ICD-10-CM

## 2021-09-17 NOTE — Addendum Note (Signed)
Addended by: Dorisann Frames on: 09/17/2021 10:32 AM   Modules accepted: Orders

## 2021-09-18 ENCOUNTER — Ambulatory Visit (HOSPITAL_BASED_OUTPATIENT_CLINIC_OR_DEPARTMENT_OTHER): Admission: RE | Admit: 2021-09-18 | Payer: No Typology Code available for payment source | Source: Ambulatory Visit

## 2021-09-18 ENCOUNTER — Other Ambulatory Visit: Payer: Self-pay

## 2021-09-18 ENCOUNTER — Ambulatory Visit (HOSPITAL_BASED_OUTPATIENT_CLINIC_OR_DEPARTMENT_OTHER)
Admission: RE | Admit: 2021-09-18 | Discharge: 2021-09-18 | Disposition: A | Discharge status: Home / Self Care | Payer: PRIVATE HEALTH INSURANCE | Source: Ambulatory Visit | Attending: Urology | Admitting: Urology

## 2021-09-18 ENCOUNTER — Encounter (HOSPITAL_BASED_OUTPATIENT_CLINIC_OR_DEPARTMENT_OTHER): Payer: Self-pay

## 2021-09-18 DIAGNOSIS — C641 Malignant neoplasm of right kidney, except renal pelvis: Secondary | ICD-10-CM | POA: Diagnosis not present

## 2021-09-18 LAB — POCT I-STAT CREATININE: Creatinine, Ser: 1.2 mg/dL — ABNORMAL HIGH (ref 0.44–1.00)

## 2021-09-18 MED ORDER — IOHEXOL 300 MG/ML  SOLN
100.0000 mL | Freq: Once | INTRAMUSCULAR | Status: AC | PRN
  Administered 2021-09-18: 100 mL via INTRAVENOUS

## 2021-09-19 ENCOUNTER — Other Ambulatory Visit (HOSPITAL_BASED_OUTPATIENT_CLINIC_OR_DEPARTMENT_OTHER): Payer: Self-pay | Admitting: Urology

## 2021-09-19 ENCOUNTER — Ambulatory Visit (HOSPITAL_BASED_OUTPATIENT_CLINIC_OR_DEPARTMENT_OTHER)
Admission: RE | Admit: 2021-09-19 | Discharge: 2021-09-19 | Disposition: A | Discharge status: Home / Self Care | Payer: PRIVATE HEALTH INSURANCE | Source: Ambulatory Visit | Attending: Urology | Admitting: Urology

## 2021-09-19 DIAGNOSIS — C641 Malignant neoplasm of right kidney, except renal pelvis: Secondary | ICD-10-CM

## 2021-09-21 ENCOUNTER — Encounter: Payer: Self-pay | Admitting: Urology

## 2021-09-21 ENCOUNTER — Other Ambulatory Visit: Payer: Self-pay

## 2021-09-21 ENCOUNTER — Ambulatory Visit (INDEPENDENT_AMBULATORY_CARE_PROVIDER_SITE_OTHER): Payer: No Typology Code available for payment source | Admitting: Urology

## 2021-09-21 VITALS — BP 136/70 | HR 63

## 2021-09-21 DIAGNOSIS — C641 Malignant neoplasm of right kidney, except renal pelvis: Secondary | ICD-10-CM | POA: Diagnosis not present

## 2021-09-21 DIAGNOSIS — N2889 Other specified disorders of kidney and ureter: Secondary | ICD-10-CM

## 2021-09-21 DIAGNOSIS — Z905 Acquired absence of kidney: Secondary | ICD-10-CM | POA: Diagnosis not present

## 2021-09-21 LAB — URINALYSIS, ROUTINE W REFLEX MICROSCOPIC
Bilirubin, UA: NEGATIVE
Glucose, UA: NEGATIVE
Ketones, UA: NEGATIVE
Leukocytes,UA: NEGATIVE
Nitrite, UA: NEGATIVE
Protein,UA: NEGATIVE
RBC, UA: NEGATIVE
Specific Gravity, UA: 1.025 (ref 1.005–1.030)
Urobilinogen, Ur: 0.2 mg/dL (ref 0.2–1.0)
pH, UA: 6 (ref 5.0–7.5)

## 2021-09-21 MED ORDER — IOHEXOL 300 MG/ML  SOLN
100.0000 mL | Freq: Once | INTRAMUSCULAR | Status: AC | PRN
  Administered 2021-09-21: 100 mL via INTRAVENOUS

## 2021-09-21 NOTE — Progress Notes (Signed)

## 2021-09-21 NOTE — Patient Instructions (Signed)
Kidney Cancer ?Kidney cancer is an abnormal growth of cells in one or both kidneys. The kidneys filter waste from your blood and produce urine. Kidney cancer may spread to other parts of your body. This type of cancer may also be called renal cell carcinoma. ?What are the causes? ?The cause of this condition is not always known. In some cases, abnormal changes to genes (genetic mutations) can cause cells to form cancer. ?What increases the risk? ?You may be more likely to develop kidney cancer if you: ?Are over age 60. The risk increases with age. ?Have a family history of kidney cancer. ?Are of African-American, Native American, or Native [place] descent. ?Smoke. ?Are female. ?Are obese. ?Have high blood pressure (hypertension). ?Have advanced kidney disease, especially if you need long-term dialysis. ?Have certain conditions that are passed from parent to child (inherited), such as von Hippel-Lindau disease, tuberous sclerosis, or hereditary papillary renal carcinoma. ?Have been exposed to certain chemicals. ?What are the signs or symptoms? ?In the early stages, kidney cancer does not cause symptoms. As the cancer grows, symptoms may include: ?Blood in the urine. ?Pain in the upper back or abdomen, just below the rib cage. You may feel pain on one or both sides of the body. ?Fatigue. ?Unexplained weight loss. ?Fever. ?How is this diagnosed? ?This condition may be diagnosed based on: ?Your symptoms and medical history. ?A physical exam. ?Blood and urine tests. ?X-rays. ?Imaging tests, such as CT scans, MRIs, and PET scans. ?Having dye injected into your blood through an IV, and then having X-rays taken of: ?Your kidneys and the rest of the organs involved in making and storing urine (intravenous pyelogram). ?Your blood vessels (angiogram). ?Removal and testing of a kidney tissue sample (biopsy). ?Your cancer will be assessed (staged), based on how severe it is and how much it has spread. ?How is this  treated? ?Treatment depends on the type and stage of the cancer. Treatment may include one or more of the following: ?Surgery. This may include surgery to remove: ?Just the tumor (nephron-sparing surgery). ?The entire kidney (nephrectomy). ?The kidney, some of the surrounding healthy tissue, nearby lymph nodes, and the adrenal gland in certain cases (radical nephrectomy). ?Medicines that kill cancer cells (chemotherapy). ?High-energy rays that kill cancer cells (radiation therapy). ?Targeted therapy. This targets specific parts of cancer cells and the area around them to block the growth and the spread of the cancer. Targeted therapy can help to limit the damage to healthy cells. ?Medicines that help your body's disease-fighting system (immune system) fight cancer cells (immunotherapy). ?Freezing cancer cells using gas or liquid that is delivered through a needle (cryoablation). ?Destroying cancer cells using high-energy radio waves that are delivered through a needle-like probe (radiofrequency ablation). ?A procedure to block the artery that supplies blood to the tumor, which kills the cancer cells (embolization). ?Follow these instructions at home: ?Eating and drinking ?Some of your treatments might affect your appetite and your ability to chew and swallow. If you are having problems eating, or if you do not have an appetite, meet with a diet and nutrition specialist (dietitian). ?If you have side effects that affect eating, it may help to: ?Eat smaller meals and snacks often. ?Drink high-nutrition and high-calorie shakes or supplements. ?Eat bland and soft foods that are easy to eat. ?Not eat foods that are hot, spicy, or hard to swallow. ?Lifestyle ?Do not drink alcohol. ?Do not use any products that contain nicotine or tobacco, such as cigarettes and e-cigarettes. If you need help   quitting, ask your health care provider. ?General instructions ? ?Take over-the-counter and prescription medicines only as told by  your health care provider. This includes vitamins, supplements, and herbal products. ?Consider joining a support group to help you cope with the stress of having kidney cancer. ?Work with your health care provider to manage any side effects of treatment. ?Keep all follow-up visits as told by your health care provider. This is important. ?Where to find more information ?American Cancer Society: https://www.cancer.org ?National Cancer Institute (NCI): https://www.cancer.gov ?Contact a health care provider if you: ?Notice that you bruise or bleed easily. ?Are losing weight without trying. ?Have new or increased fatigue or weakness. ?Get help right away if you have: ?Blood in your urine. ?A sudden increase in pain. ?A fever. ?Shortness of breath. ?Chest pain. ?Yellow skin or whites of your eyes (jaundice). ?Summary ?Kidney cancer is an abnormal growth of cells (tumor) in one or both kidneys. Tumors may spread to other parts of your body. ?In the early stages, kidney cancer does not cause symptoms. As the cancer grows, symptoms may include blood in the urine, pain in the upper back or abdomen, unexplained weight loss, fatigue, and fever. ?Treatment depends on the type and stage of the cancer. It may include surgery to remove the tumor, procedures and medicines to kill the cancer cells, or medicines to help your body fight cancer cells. ?This information is not intended to replace advice given to you by your health care provider. Make sure you discuss any questions you have with your health care provider. ?Document Revised: 05/15/2021 Document Reviewed: 05/15/2021 ?Elsevier Patient Education ? 2022 Elsevier Inc. ? ?

## 2021-09-21 NOTE — Progress Notes (Signed)
09/21/2021 9:08 AM   [name] 08/04/1961 [id number]  Referring provider: Fayrene Helper, MD [address], [place],  [place] [address]  Followup RCC   HPI: [name] is a 60yo here for followup for right RCC. She underwent CT 12/14 which showed an increased right retroperitoneal mass and new liver metastasis. She denies weight loss, chills, fatigue. No LUTS. No other complaints   PMH: Past Medical History:  Diagnosis Date   Adenocarcinoma of breast ([place])    left    Cellulitis of leg, left    Complication of anesthesia    Hard to wake up   Diabetes mellitus without complication ([place])    Pt denies   Family history of breast cancer 08/16/2011   Hyperlipidemia    Hypertension    Hypothyroidism    MRSA (methicillin resistant staph aureus) culture positive 08/19/2011   Pre-diabetes     Surgical History: Past Surgical History:  Procedure Laterality Date   BREAST SURGERY Left    mastectomy   CESAREAN SECTION     x2   COLONOSCOPY N/A 03/03/2020   Procedure: COLONOSCOPY;  Surgeon: Daneil Dolin, MD;  [facility];  Service: Endoscopy;  Laterality: N/A;  12:00   left mastectomy     NEPHRECTOMY Right 02/06/2021   Procedure: NEPHRECTOMY- open radical;  Surgeon: Cleon Gustin, MD;  [facility];  Service: Urology;  Laterality: Right;   POLYPECTOMY  03/03/2020   Procedure: POLYPECTOMY;  Surgeon: Daneil Dolin, MD;  [facility];  Service: Endoscopy;;    Home Medications:  Allergies as of 09/21/2021       Reactions   Sulfonamide Derivatives Itching   Yellow Jacket Venom [bee Venom] Rash   urticaria        Medication List        Accurate as of September 21, 2021  9:08 AM. If you have any questions, ask your nurse or doctor.          STOP taking these medications    metroNIDAZOLE 500 MG tablet Commonly known as: FLAGYL Stopped by: Nicolette Bang, MD       TAKE these medications    amLODipine 5 MG  tablet Commonly known as: NORVASC TAKE 1 TABLET (5 MG TOTAL) BY MOUTH DAILY.   atorvastatin 10 MG tablet Commonly known as: LIPITOR TAKE ONE TABLET BY MOUTH ONCE DAILY What changed:  how much to take how to take this when to take this additional instructions   EPINEPHrine 0.3 mg/0.3 mL Soaj injection Commonly known as: EPI-PEN Inject 0.3 mg into the muscle as needed for anaphylaxis.   Iron (Ferrous Sulfate) 325 (65 Fe) MG Tabs Take 325 mg by mouth daily.   levothyroxine 88 MCG tablet Commonly known as: SYNTHROID TAKE 1 TABLET(88 MCG) BY MOUTH DAILY BEFORE AND BREAKFAST   UNABLE TO FIND MASTECTOMY BRA AND PROTHESIS DX Z90.12   UNABLE TO FIND 6 mastectomy prosthesis and bras.        Allergies:  Allergies  Allergen Reactions   Sulfonamide Derivatives Itching   Yellow Jacket Venom [Bee Venom] Rash    urticaria    Family History: Family History  Problem Relation Age of Onset   Hypertension Mother    Diabetes Mother    Hyperlipidemia Mother    COPD Brother    Cancer Sister        breast   Cancer Sister        breast  Social History:  reports that she quit smoking about 19 years ago. Her smoking use included cigarettes. She has a 9.00 pack-year smoking history. She has never used smokeless tobacco. She reports that she does not drink alcohol and does not use drugs.  ROS: All other review of systems were reviewed and are negative except what is noted above in HPI  Physical Exam: BP 136/70    Pulse 63   Constitutional:  Alert and oriented, No acute distress. HEENT: [place] AT, moist mucus membranes.  Trachea midline, no masses. Cardiovascular: No clubbing, cyanosis, or edema. Respiratory: Normal respiratory effort, no increased work of breathing. GI: Abdomen is soft, nontender, nondistended, no abdominal masses GU: No CVA tenderness.  Lymph: No cervical or inguinal lymphadenopathy. Skin: No rashes, bruises or suspicious lesions. Neurologic: Grossly intact,  no focal deficits, moving all 4 extremities. Psychiatric: Normal mood and affect.  Laboratory Data: Lab Results  Component Value Date   WBC 5.8 07/10/2021   HGB 11.1 07/10/2021   HCT 33.7 (L) 07/10/2021   MCV 89 07/10/2021   PLT 374 07/10/2021    Lab Results  Component Value Date   CREATININE 1.20 (H) 09/18/2021    No results found for: PSA  No results found for: TESTOSTERONE  Lab Results  Component Value Date   HGBA1C 6.7 (H) 07/10/2021    Urinalysis    Component Value Date/Time   COLORURINE RED (A) 12/23/2020 1939   APPEARANCEUR Clear 02/21/2021 1127   LABSPEC 1.002 (L) 08/25/2020 2020   PHURINE 7.0 08/25/2020 2020   GLUCOSEU Negative 02/21/2021 1127   HGBUR (A) 12/23/2020 1939    TEST NOT REPORTED DUE TO COLOR INTERFERENCE OF URINE PIGMENT   BILIRUBINUR neg 04/18/2021 0855   BILIRUBINUR Negative 02/21/2021 1127   KETONESUR (A) 12/23/2020 1939    TEST NOT REPORTED DUE TO COLOR INTERFERENCE OF URINE PIGMENT   PROTEINUR Negative 04/18/2021 0855   PROTEINUR Negative 02/21/2021 1127   PROTEINUR (A) 12/23/2020 1939    TEST NOT REPORTED DUE TO COLOR INTERFERENCE OF URINE PIGMENT   UROBILINOGEN 0.2 04/18/2021 0855   NITRITE neg 04/18/2021 0855   NITRITE Negative 02/21/2021 1127   NITRITE (A) 12/23/2020 1939    TEST NOT REPORTED DUE TO COLOR INTERFERENCE OF URINE PIGMENT   LEUKOCYTESUR Negative 04/18/2021 0855   LEUKOCYTESUR Negative 02/21/2021 1127   LEUKOCYTESUR (A) 12/23/2020 1939    TEST NOT REPORTED DUE TO COLOR INTERFERENCE OF URINE PIGMENT    Lab Results  Component Value Date   LABMICR <3.0 07/10/2021   WBCUA 0-5 02/21/2021   LABEPIT >10 (A) 02/21/2021   BACTERIA Few (A) 02/21/2021    Pertinent Imaging: CT 09/19/2021: Images reviewed and discussed with the patient No results found for this or any previous visit.  No results found for this or any previous visit.  No results found for this or any previous visit.  No results found for this or any  previous visit.  No results found for this or any previous visit.  No results found for this or any previous visit.  No results found for this or any previous visit.  Results for orders placed during the hospital encounter of 12/23/20  CT RENAL STONE STUDY  Narrative CLINICAL DATA:  60 year old female with flank pain. Concern for kidney stone.  EXAM: CT ABDOMEN AND PELVIS WITHOUT CONTRAST  TECHNIQUE: Multidetector CT imaging of the abdomen and pelvis was performed following the standard protocol without IV contrast.  COMPARISON:  None.  FINDINGS: Evaluation of this  exam is limited in the absence of intravenous contrast.  Lower chest: Bibasilar linear atelectasis/scarring. There is a 3 mm right lower lobe nodule (8/4). The visualized lung bases are otherwise clear. There is hypoattenuation of the cardiac blood pool suggestive of anemia. Clinical correlation is recommended.  No intra-abdominal free air or free fluid.  Hepatobiliary: Apparent infiltration of the right renal mass into the posterior right lobe of the liver (segment VII). The liver is otherwise unremarkable. No intrahepatic biliary dilatation. No calcified gallstone or pericholecystic fluid.  Pancreas: Unremarkable. No pancreatic ductal dilatation or surrounding inflammatory changes.  Spleen: Normal in size without focal abnormality.  Adrenals/Urinary Tract: The adrenal glands unremarkable. There is a solid predominantly hypoattenuating right renal upper pole mass measuring approximately 6 x 9 cm in greatest axial dimensions and 11 cm in craniocaudal length most consistent with malignancy. Further characterization with renal mass protocol MRI on a nonemergent/outpatient basis recommended. There is superior extension of the mass with apparent invasion into the right lobe of the liver. There is probable extension of the mass into the right renal pelvis with a degree of obstruction and mild  right hydronephrosis. There is no hydronephrosis or nephrolithiasis on the left. The visualized ureters and urinary bladder appear unremarkable.  Stomach/Bowel: There is moderate stool throughout the colon. No bowel obstruction or active inflammation. The appendix is normal.  Vascular/Lymphatic: Mild aortoiliac atherosclerotic disease. The IVC is unremarkable. Evaluation for possible tumor thrombus is limited on this noncontrast CT. CT with IV contrast may provide better evaluation. No portal venous gas. No adenopathy.  Reproductive: The uterus is anteverted and grossly unremarkable. No adnexal masses.  Other: Left mastectomy with partially visualized prosthesis.  Musculoskeletal: Degenerative changes of the spine. No acute osseous pathology.  IMPRESSION: 1. Large right renal upper pole mass most consistent with malignancy. CT with IV contrast may provide better evaluation of potential tumor thrombus. 2. Mild right hydronephrosis secondary to extension of the mass into the right renal pelvis. 3. No bowel obstruction. Normal appendix. 4. A 3 mm right lower lobe nodule. 5. Aortic Atherosclerosis (ICD10-I70.0).   Electronically Signed By: Anner Crete M.D. On: 12/24/2020 00:49   Assessment & Plan:    1. 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  Nicolette Bang, MD  [facility] [place]

## 2021-09-26 NOTE — Progress Notes (Signed)
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  HISTORY OF PRESENTING ILLNESS:  [name] 60 y.o. female is here because of evaluation of renal dell carcinoma of right kidney, at the request of AUR.  Today she reports feeling good. She denies nausea, vomiting, weight loss, vision changes, and new pains. She reports history of kidney cancer treated with nephrectomy on 02/06/2021. She also has a history of breast cancer in 2004 which was treated with mastectomy; she did not receive antiestrogen therapy. She is pre-diabetic. She currently lives at home with her family, and she takes care of her mother. She works as a Quarry manager at [employer]. She quit smoking 2004 after smoking 1/2 ppd for 5-10 years. Her maternal half sister had kidney cancer, another maternal half sister passed from lung cancer, her paternal half sister had breast cancer, and her maternal grandfather had kidney cancer.   MEDICAL HISTORY:  Past Medical History:  Diagnosis Date   Adenocarcinoma of breast ([place])    left    Cellulitis of leg, left    Complication of anesthesia    Hard to wake up   Diabetes mellitus without complication ([place])    Pt denies   Family history of breast cancer 08/16/2011   Hyperlipidemia    Hypertension    Hypothyroidism    MRSA (methicillin resistant staph aureus) culture positive 08/19/2011   Pre-diabetes     SURGICAL HISTORY: Past Surgical History:  Procedure Laterality Date   BREAST SURGERY Left    mastectomy   CESAREAN SECTION     x2   COLONOSCOPY N/A 03/03/2020   Procedure: COLONOSCOPY;  Surgeon: Daneil Dolin, MD;  [facility];  Service: Endoscopy;  Laterality:  N/A;  12:00   left mastectomy     NEPHRECTOMY Right 02/06/2021   Procedure: NEPHRECTOMY- open radical;  Surgeon: Cleon Gustin, MD;  Location: [facility];  Service: Urology;  Laterality: Right;   POLYPECTOMY  03/03/2020   Procedure: POLYPECTOMY;  Surgeon: Daneil Dolin, MD;  Location: [facility];  Service: Endoscopy;;    SOCIAL HISTORY: Social History   Socioeconomic History   Marital status: Married    Spouse name: Not on file   Number of children: 2   Years of education: Not on file   Highest education level: Not on file  Occupational History   Occupation: CNA  Tobacco Use   Smoking status: Former    Packs/day: 0.50    Years: 18.00    Pack years: 9.00    Types: Cigarettes    Quit date: 07/01/2002    Years since quitting: 19.2   Smokeless tobacco: Never  Vaping Use   Vaping Use: Never used  Substance and Sexual Activity   Alcohol use: No   Drug use: No   Sexual activity: Not Currently  Other Topics Concern   Not on file  Social History Narrative   Not on file   Social Determinants of Health   Financial Resource Strain: Not on file  Food Insecurity: Not on file  Transportation Needs: Not on file  Physical Activity: Not on file  Stress: Not on file  Social Connections: Not on file  Intimate Partner Violence: Not on file  FAMILY HISTORY: Family History  Problem Relation Age of Onset   Hypertension Mother    Diabetes Mother    Hyperlipidemia Mother    COPD Brother    Cancer Sister        breast   Cancer Sister        breast    ALLERGIES:  is allergic to sulfonamide derivatives and yellow jacket venom [bee venom].  MEDICATIONS:  Current Outpatient Medications  Medication Sig Dispense Refill   amLODipine (NORVASC) 5 MG tablet TAKE 1 TABLET (5 MG TOTAL) BY MOUTH DAILY. 90 tablet 3   atorvastatin (LIPITOR) 10 MG tablet TAKE ONE TABLET BY MOUTH ONCE DAILY (Patient taking differently: Take 10 mg by mouth daily.) 90 tablet 3   EPINEPHrine 0.3 mg/0.3  mL IJ SOAJ injection Inject 0.3 mg into the muscle as needed for anaphylaxis. 2 each 0   Iron, Ferrous Sulfate, 325 (65 Fe) MG TABS Take 325 mg by mouth daily. 30 tablet 3   levothyroxine (SYNTHROID) 88 MCG tablet TAKE 1 TABLET(88 MCG) BY MOUTH DAILY BEFORE AND BREAKFAST 90 tablet 1   UNABLE TO FIND MASTECTOMY BRA AND PROTHESIS DX Z90.12 6 each 2   UNABLE TO FIND 6 mastectomy prosthesis and bras. 6 each 0   No current facility-administered medications for this visit.    REVIEW OF SYSTEMS:   Review of Systems  Constitutional:  Negative for appetite change, fatigue (80%) and unexpected weight change.  Eyes:  Negative for eye problems.  Gastrointestinal:  Negative for nausea and vomiting.  Neurological:  Positive for numbness (L foot).  Psychiatric/Behavioral:  Positive for sleep disturbance.   All other systems reviewed and are negative.   PHYSICAL EXAMINATION: ECOG PERFORMANCE STATUS: 0 - Asymptomatic  Vitals:   09/27/21 0814  BP: 133/74  Pulse: 80  Resp: 18  Temp: (!) 97.5 F (36.4 C)  SpO2: 100%   Filed Weights   09/27/21 0814  Weight: 195 lb 12.8 oz (88.8 kg)   Physical Exam Vitals reviewed.  Constitutional:      Appearance: Normal appearance. She is obese.  Cardiovascular:     Rate and Rhythm: Normal rate and regular rhythm.     Pulses: Normal pulses.     Heart sounds: Normal heart sounds.  Pulmonary:     Effort: Pulmonary effort is normal.     Breath sounds: Normal breath sounds.  Abdominal:     Palpations: Abdomen is soft. There is no hepatomegaly, splenomegaly or mass.     Tenderness: There is no abdominal tenderness.  Musculoskeletal:     Right lower leg: No edema.     Left lower leg: No edema.  Lymphadenopathy:     Upper Body:     Right upper body: No supraclavicular, axillary or pectoral adenopathy.     Left upper body: No supraclavicular, axillary or pectoral adenopathy.  Neurological:     General: No focal deficit present.     Mental Status: She  is alert and oriented to person, place, and time.  Psychiatric:        Mood and Affect: Mood normal.        Behavior: Behavior normal.     LABORATORY DATA:  I have reviewed the data as listed CBC Latest Ref Rng & Units 07/10/2021 02/21/2021 02/13/2021  WBC 3.4 - 10.8 x10E3/uL 5.8 6.8 7.1  Hemoglobin 11.1 - 15.9 g/dL 11.1 8.9(L) 8.1(L)  Hematocrit 34.0 - 46.6 % 33.7(L) 28.1(L) 25.8(L)  Platelets 150 - 450 x10E3/uL 374 409 391  CMP Latest Ref Rng & Units 09/18/2021 07/10/2021 05/30/2021  Glucose 70 - 99 mg/dL - 82 101(H)  BUN 8 - 27 mg/dL - 18 19  Creatinine 0.44 - 1.00 mg/dL 1.20(H) 1.05(H) 1.04(H)  Sodium 134 - 144 mmol/L - 138 141  Potassium 3.5 - 5.2 mmol/L - 4.6 4.7  Chloride 96 - 106 mmol/L - 101 104  CO2 20 - 29 mmol/L - 22 24  Calcium 8.7 - 10.3 mg/dL - 9.8 9.5  Total Protein 6.0 - 8.5 g/dL - 7.4 7.4  Total Bilirubin 0.0 - 1.2 mg/dL - 0.3 <0.2  Alkaline Phos 44 - 121 IU/L - 98 98  AST 0 - 40 IU/L - 16 12  ALT 0 - 32 IU/L - 12 12    RADIOGRAPHIC STUDIES: I have personally reviewed the radiological images as listed and agreed with the findings in the report. CT ABDOMEN PELVIS W WO CONTRAST  Result Date: 09/21/2021 CLINICAL DATA:  Renal cell carcinoma.  RIGHT nephrectomy 1,022. EXAM: CT ABDOMEN WITHOUT AND WITH CONTRAST on CT pelvis with contrast TECHNIQUE: Multidetector CT imaging of the abdomen and pelvis was performed following the standard protocol before and following the bolus administration of intravenous contrast. CONTRAST:  133m OMNIPAQUE IOHEXOL 300 MG/ML  SOLN COMPARISON:  CT 05/28/2021 FINDINGS: Lower chest: Band like atelectasis the RIGHT lung base. Small pulmonary nodule at the RIGHT lung base measuring 5 mm (image 20/series 5) Hepatobiliary: Nodularity along the RIGHT hepatic lobe capsule (image 52/8 image 64/8 for example). Concern for frank extension into the liver parenchyma with a enhancing lesion present on image 38/series 8 measuring approximately 15 mm.  Additional parenchymal lesion image 21/series 13 in the RIGHT hepatic lobe. Pancreas: Normal pancreatic parenchymal intensity. No ductal dilatation or inflammation. Spleen: Normal spleen. Adrenals/urinary tract: Adrenal glands normal. Post RIGHT nephrectomy. There is nodularity within the superior aspect of the RIGHT nephrectomy bed adjacent to the RIGHT hepatic lobe. New enhancing nodule measures 3.7 x 2.7 cm (image 57/series 6). Smaller nodules stud the liver margin and retroperitoneal space and are increased in size from prior. More inferiorly within the retroperitoneum several distinct round nodules are present. For example 2 adjacent nodules measuring 7 mm each on image 87/8. More inferiorly enhancing nodule just ventral to the psoas muscle at the aortic bifurcation measures 19 mm on image 102/8. Nodularity in the RIGHT perirenal space described on comparison exam (05/28/2021) and advanced in the interval. Stomach/Bowel: Stomach and limited of the small bowel is unremarkable Vascular/Lymphatic: Abdominal aortic normal caliber. No retroperitoneal periportal lymphadenopathy. Reproductive: Uterus and adnexa unremarkable. Musculoskeletal: No aggressive osseous lesion IMPRESSION: 1. Progression nodularity within the RIGHT nephrectomy bed. Now with obvious enlarged enhancing nodules adjacent to the RIGHT hepatic lobe extending inferiorly along the RIGHT retroperitoneum with multiple enlarged enhancing nodules. 2. Direct metastatic invasion into the RIGHT hepatic lobe. 3. Small nodule at the RIGHT lung base is favored benign. Recommend attention on follow-up. These results will be called to the ordering clinician or representative by the Radiologist Assistant, and communication documented in the PACS or [employer] Electronically Signed   By: SSuzy BouchardM.D.   On: 09/21/2021 13:26   CT ABDOMEN W WO CONTRAST  Result Date: 09/19/2021 CLINICAL DATA:  Renal cell carcinoma.  RIGHT nephrectomy 1,022. EXAM:  CT ABDOMEN WITHOUT AND WITH CONTRAST on CT pelvis with contrast TECHNIQUE: Multidetector CT imaging of the abdomen and pelvis was performed following the standard protocol before and following the bolus administration of intravenous contrast. CONTRAST:  1050m  OMNIPAQUE IOHEXOL 300 MG/ML  SOLN COMPARISON:  CT 05/28/2021 FINDINGS: Lower chest: Band like atelectasis the RIGHT lung base. Small pulmonary nodule at the RIGHT lung base measuring 5 mm (image 20/series 5) Hepatobiliary: Nodularity along the RIGHT hepatic lobe capsule (image 52/8 image 64/8 for example). Concern for frank extension into the liver parenchyma with a enhancing lesion present on image 38/series 8 measuring approximately 15 mm. Additional parenchymal lesion image 21/series 13 in the RIGHT hepatic lobe. Pancreas: Normal pancreatic parenchymal intensity. No ductal dilatation or inflammation. Spleen: Normal spleen. Adrenals/urinary tract: Adrenal glands normal. Post RIGHT nephrectomy. There is nodularity within the superior aspect of the RIGHT nephrectomy bed adjacent to the RIGHT hepatic lobe. New enhancing nodule measures 3.7 x 2.7 cm (image 57/series 6). Smaller nodules stud the liver margin and retroperitoneal space and are increased in size from prior. More inferiorly within the retroperitoneum several distinct round nodules are present. For example 2 adjacent nodules measuring 7 mm each on image 87/8. More inferiorly enhancing nodule just ventral to the psoas muscle at the aortic bifurcation measures 19 mm on image 102/8. Nodularity in the RIGHT perirenal space described on comparison exam (05/28/2021) and advanced in the interval. Stomach/Bowel: Stomach and limited of the small bowel is unremarkable Vascular/Lymphatic: Abdominal aortic normal caliber. No retroperitoneal periportal lymphadenopathy. Reproductive: Uterus and adnexa unremarkable. Musculoskeletal: No aggressive osseous lesion IMPRESSION: 1. Progression nodularity within the RIGHT  nephrectomy bed. Now with obvious enlarged enhancing nodules adjacent to the RIGHT hepatic lobe extending inferiorly along the RIGHT retroperitoneum with multiple enlarged enhancing nodules. 2. Direct metastatic invasion into the RIGHT hepatic lobe. 3. Small nodule at the RIGHT lung base is favored benign. Recommend attention on follow-up. These results will be called to the ordering clinician or representative by the Radiologist Assistant, and communication documented in the PACS or [employer]. Electronically Signed   By: Suzy Bouchard M.D.   On: 09/19/2021 16:32    ASSESSMENT:  Metastatic clear-cell renal cell carcinoma: - Right radical nephrectomy on 02/06/2021. - Pathology shows clear-cell RCC, grade 4, 12.5 cm, necrosis and rhabdoid features are identified.  Tumor extends into the and invades the wall of the vena cava (pT3c).  Vascular, ureteral and all margins of resection are negative for tumor. - CT scan abdomen with and without contrast on 05/29/2021 showed several nodules along the peritoneal surface of the right nephrectomy bed warranting close attention. - CTAP with and without contrast on 09/18/2021 showed progressive nodularity within the right nephrectomy bed, with enlarged nodules adjacent to the right hepatic lobe extending inferiorly along the right retroperitoneum with multiple enlarged enhancing nodules.  Direct metastatic invasion into the right hepatic lobe.  Small nodule at the right lung base favored to be benign. - IMDC: Intermediate risk group with 2 features including diagnosis less than 1 year and hemoglobin lower than normal.   Social/family history: - She lives at home with her husband and also takes care of her mother. - She works as a Buyer, retail at [employer].  She quit smoking in 2004 and smoked half pack per day for 10 years. - Half sister (same mother) had kidney cancer at age 20.  Maternal grandfather had kidney cancer.  Sister died of lung  cancer.  2 half sisters (same father) had breast cancers.  3.  Left breast stage I tubular adenocarcinoma: - She underwent left mastectomy on 06/11/2002.  0/6 lymph nodes positive.  ER 90%, PR 92%, Ki-67 6%, HER2 negative.  As per chart, declined  antiestrogen therapy.   PLAN:  Metastatic clear-cell RCC: - We have reviewed pathology reports, latest CT scans. - She is completely asymptomatic without any abdominal pain or GI symptoms.  No headaches or vision changes.  She is working full-time job. - Unfortunately she has developed metastatic disease in the renal bed, with invasion of the liver. - I have recommended CT scan of the chest with contrast and bone scan to complete work-up.  No clinical indication for brain MRI at this time. - Given the family history, recommended genetic evaluation. - She has IMDC intermediate risk. - Recommend liver biopsy given her history of prior breast cancer. - We discussed the treatment intent in palliative setting.  I am favoring combination of cabozantinib and nivolumab because of high response rates and improved PFS.   All questions were answered. The patient knows to call the clinic with any problems, questions or concerns.   Derek Jack, MD, 09/27/21 8:46 AM  [place] [phone]   I, Thana Ates, am acting as a scribe for Dr. Derek Jack.  I, Derek Jack MD, have reviewed the above documentation for accuracy and completeness, and I agree with the above.

## 2021-09-27 ENCOUNTER — Inpatient Hospital Stay (HOSPITAL_COMMUNITY): Payer: No Typology Code available for payment source | Attending: Hematology | Admitting: Hematology

## 2021-09-27 ENCOUNTER — Encounter (HOSPITAL_COMMUNITY): Payer: Self-pay | Admitting: Hematology

## 2021-09-27 ENCOUNTER — Encounter (HOSPITAL_COMMUNITY): Payer: Self-pay

## 2021-09-27 ENCOUNTER — Inpatient Hospital Stay (HOSPITAL_COMMUNITY): Payer: No Typology Code available for payment source

## 2021-09-27 ENCOUNTER — Encounter (HOSPITAL_COMMUNITY): Payer: Self-pay | Admitting: Radiology

## 2021-09-27 ENCOUNTER — Other Ambulatory Visit: Payer: Self-pay

## 2021-09-27 VITALS — BP 133/74 | HR 80 | Temp 97.5°F | Resp 18 | Ht 66.0 in | Wt 195.8 lb

## 2021-09-27 DIAGNOSIS — Z8051 Family history of malignant neoplasm of kidney: Secondary | ICD-10-CM | POA: Diagnosis not present

## 2021-09-27 DIAGNOSIS — C50912 Malignant neoplasm of unspecified site of left female breast: Secondary | ICD-10-CM

## 2021-09-27 DIAGNOSIS — C641 Malignant neoplasm of right kidney, except renal pelvis: Secondary | ICD-10-CM | POA: Diagnosis not present

## 2021-09-27 DIAGNOSIS — Z803 Family history of malignant neoplasm of breast: Secondary | ICD-10-CM

## 2021-09-27 DIAGNOSIS — C787 Secondary malignant neoplasm of liver and intrahepatic bile duct: Secondary | ICD-10-CM | POA: Diagnosis not present

## 2021-09-27 DIAGNOSIS — Z853 Personal history of malignant neoplasm of breast: Secondary | ICD-10-CM | POA: Diagnosis not present

## 2021-09-27 DIAGNOSIS — Z801 Family history of malignant neoplasm of trachea, bronchus and lung: Secondary | ICD-10-CM

## 2021-09-27 DIAGNOSIS — Z87891 Personal history of nicotine dependence: Secondary | ICD-10-CM | POA: Diagnosis not present

## 2021-09-27 LAB — COMPREHENSIVE METABOLIC PANEL
ALT: 11 U/L (ref 0–44)
AST: 15 U/L (ref 15–41)
Albumin: 3.9 g/dL (ref 3.5–5.0)
Alkaline Phosphatase: 96 U/L (ref 38–126)
Anion gap: 7 (ref 5–15)
BUN: 15 mg/dL (ref 8–23)
CO2: 25 mmol/L (ref 22–32)
Calcium: 9.4 mg/dL (ref 8.9–10.3)
Chloride: 104 mmol/L (ref 98–111)
Creatinine, Ser: 0.91 mg/dL (ref 0.44–1.00)
GFR, Estimated: >60 mL/min (ref >60)
Glucose, Bld: 121 mg/dL — ABNORMAL HIGH (ref 70–99)
Potassium: 4.1 mmol/L (ref 3.5–5.1)
Sodium: 136 mmol/L (ref 135–145)
Total Bilirubin: 0.3 mg/dL (ref 0.3–1.2)
Total Protein: 7.9 g/dL (ref 6.5–8.1)

## 2021-09-27 LAB — LACTATE DEHYDROGENASE: LDH: 152 U/L (ref 98–192)

## 2021-09-27 LAB — CBC WITH DIFFERENTIAL/PLATELET
Abs Immature Granulocytes: 0.01 10*3/uL (ref 0.00–0.07)
Basophils Absolute: 0 10*3/uL (ref 0.0–0.1)
Basophils Relative: 0 %
Eosinophils Absolute: 0 10*3/uL (ref 0.0–0.5)
Eosinophils Relative: 0 %
HCT: 34.6 % — ABNORMAL LOW (ref 36.0–46.0)
Hemoglobin: 10.9 g/dL — ABNORMAL LOW (ref 12.0–15.0)
Immature Granulocytes: 0 %
Lymphocytes Relative: 25 %
Lymphs Abs: 1.4 10*3/uL (ref 0.7–4.0)
MCH: 30.2 pg (ref 26.0–34.0)
MCHC: 31.5 g/dL (ref 30.0–36.0)
MCV: 95.8 fL (ref 80.0–100.0)
Monocytes Absolute: 0.4 10*3/uL (ref 0.1–1.0)
Monocytes Relative: 7 %
Neutro Abs: 3.6 10*3/uL (ref 1.7–7.7)
Neutrophils Relative %: 68 %
Platelets: 320 10*3/uL (ref 150–400)
RBC: 3.61 MIL/uL — ABNORMAL LOW (ref 3.87–5.11)
RDW: 13.1 % (ref 11.5–15.5)
WBC: 5.4 10*3/uL (ref 4.0–10.5)
nRBC: 0 % (ref 0.0–0.2)

## 2021-09-27 LAB — GENETIC SCREENING ORDER

## 2021-09-27 NOTE — Progress Notes (Signed)
I met with the patient during and following her initial visit with Dr. Delton Coombes. I introduced myself and explained my role in the patient's care. I provided my contact information and encouraged the patient to call with questions or concerns.

## 2021-09-27 NOTE — Progress Notes (Signed)
Patient Name  Deanna Bush, Deanna Bush Legal Sex  Female DOB  05/04/1961 SSN  [id number] Address  [place] [address] Phone  [phone] [facility])  [phone] (Work)  [phone] (Mobile)    RE: CT Biopsy Received: Today Criselda Peaches, MD  Jillyn Hidden; P Ir Procedure Requests Approved for CT guided bx of nodular mets in right nephrectomy bed which are directly invading the liver.  Small satellite liver lesions also present but higher risk for biopsy.   HKM        Previous Messages   ----- Message -----  From: Garth Bigness D  Sent: 09/27/2021  10:19 AM EST  To: Ir Procedure Requests  Subject: CT Biopsy                                       Procedure:  CT Biopsy   Reason:  Renal cell carcinoma of right kidney, RCC, new liver lesion. Please biopsy liver lesion   History:   CT in computer, NM Bone and CT Chest Scheduled   Provider:  Derek Jack   Provider Contact:  [phone]

## 2021-09-27 NOTE — Patient Instructions (Addendum)
[place] at [facility] Discharge Instructions  You were seen and examined today by Dr. Delton Coombes. Dr. Delton Coombes is a medical oncologist, meaning that he specializes in the management of cancer diagnoses. Dr. Delton Coombes discussed your past medical history, family history of cancers, and the events that led to you being here today.  You have been diagnosed with Renal Cell Carcinoma. Dr. Delton Coombes reviewed your recent CT scan which did reveal that the cancer has come back where your kidney should be as well as your liver. Dr. Delton Coombes has recommended a liver biopsy to confirm. Dr. Delton Coombes has also recommended a CT of your chest and a Bone Scan. This is to get a complete picture of the stage of the cancer. If it is confirmed to be in the liver, it can be controlled with treatment options.  Dr. Delton Coombes also recommends genetic testing. This is a blood test and a meeting with the genetic counselor.  Follow-up as scheduled.   Thank you for choosing [place] at [facility] to provide your oncology and hematology care.  To afford each patient quality time with our provider, please arrive at least 15 minutes before your scheduled appointment time.   If you have a lab appointment with the [facility] please come in thru the Main Entrance and check in at the main information desk.  You need to re-schedule your appointment should you arrive 10 or more minutes late.  We strive to give you quality time with our providers, and arriving late affects you and other patients whose appointments are after yours.  Also, if you no show three or more times for appointments you may be dismissed from the clinic at the providers discretion.     Again, thank you for choosing [facility].  Our hope is that these requests will decrease the amount of time that you wait before being seen by our physicians.        _____________________________________________________________  Should you have questions after your visit to [facility], please contact our office at [phone] and follow the prompts.  Our office hours are 8:00 a.m. and 4:30 p.m. Monday - Friday.  Please note that voicemails left after 4:00 p.m. may not be returned until the following business day.  We are closed weekends and major holidays.  You do have access to a nurse 24-7, just call the main number to the clinic [phone] and do not press any options, hold on the line and a nurse will answer the phone.    For prescription refill requests, have your pharmacy contact our office and allow 72 hours.    Due to Covid, you will need to wear a mask upon entering the hospital. If you do not have a mask, a mask will be given to you at the Main Entrance upon arrival. For doctor visits, patients may have 1 support person age 41 or older with them. For treatment visits, patients can not have anyone with them due to social distancing guidelines and our immunocompromised population.

## 2021-10-03 ENCOUNTER — Other Ambulatory Visit: Payer: Self-pay

## 2021-10-03 ENCOUNTER — Ambulatory Visit (HOSPITAL_COMMUNITY): Payer: No Typology Code available for payment source

## 2021-10-03 ENCOUNTER — Ambulatory Visit (HOSPITAL_COMMUNITY)
Admission: RE | Admit: 2021-10-03 | Discharge: 2021-10-03 | Disposition: A | Discharge status: Home / Self Care | Payer: No Typology Code available for payment source | Source: Ambulatory Visit | Attending: Hematology | Admitting: Hematology

## 2021-10-03 DIAGNOSIS — C641 Malignant neoplasm of right kidney, except renal pelvis: Secondary | ICD-10-CM | POA: Insufficient documentation

## 2021-10-03 MED ORDER — IOHEXOL 300 MG/ML  SOLN
75.0000 mL | Freq: Once | INTRAMUSCULAR | Status: AC | PRN
  Administered 2021-10-03: 10:00:00 75 mL via INTRAVENOUS

## 2021-10-05 ENCOUNTER — Encounter (HOSPITAL_COMMUNITY)
Admission: RE | Admit: 2021-10-05 | Discharge: 2021-10-05 | Disposition: A | Discharge status: Home / Self Care | Payer: No Typology Code available for payment source | Source: Ambulatory Visit | Attending: Hematology | Admitting: Hematology

## 2021-10-05 ENCOUNTER — Other Ambulatory Visit: Payer: Self-pay

## 2021-10-05 ENCOUNTER — Encounter (HOSPITAL_COMMUNITY): Payer: Self-pay

## 2021-10-05 DIAGNOSIS — C641 Malignant neoplasm of right kidney, except renal pelvis: Secondary | ICD-10-CM | POA: Insufficient documentation

## 2021-10-05 MED ORDER — TECHNETIUM TC 99M MEDRONATE IV KIT
20.0000 | PACK | Freq: Once | INTRAVENOUS | Status: AC | PRN
  Administered 2021-10-05: 09:00:00 21 via INTRAVENOUS

## 2021-10-15 ENCOUNTER — Telehealth: Payer: Self-pay | Admitting: Genetic Counselor

## 2021-10-15 ENCOUNTER — Inpatient Hospital Stay: Payer: No Typology Code available for payment source | Admitting: Genetic Counselor

## 2021-10-15 NOTE — Telephone Encounter (Signed)
Patient called to reschedule genetic appt.  Appt made for [facility] on 1/12 at 9am.

## 2021-10-18 ENCOUNTER — Ambulatory Visit (HOSPITAL_COMMUNITY): Payer: No Typology Code available for payment source | Admitting: Hematology

## 2021-10-18 ENCOUNTER — Other Ambulatory Visit: Payer: Self-pay

## 2021-10-18 ENCOUNTER — Encounter (HOSPITAL_COMMUNITY): Payer: Self-pay | Admitting: Licensed Clinical Social Worker

## 2021-10-18 ENCOUNTER — Other Ambulatory Visit: Payer: Self-pay | Admitting: Radiology

## 2021-10-18 ENCOUNTER — Inpatient Hospital Stay (HOSPITAL_COMMUNITY)
Payer: No Typology Code available for payment source | Attending: Hematology | Admitting: Licensed Clinical Social Worker

## 2021-10-18 DIAGNOSIS — Z800 Family history of malignant neoplasm of digestive organs: Secondary | ICD-10-CM | POA: Insufficient documentation

## 2021-10-18 DIAGNOSIS — Z87891 Personal history of nicotine dependence: Secondary | ICD-10-CM | POA: Insufficient documentation

## 2021-10-18 DIAGNOSIS — Z8042 Family history of malignant neoplasm of prostate: Secondary | ICD-10-CM

## 2021-10-18 DIAGNOSIS — C50912 Malignant neoplasm of unspecified site of left female breast: Secondary | ICD-10-CM

## 2021-10-18 DIAGNOSIS — C641 Malignant neoplasm of right kidney, except renal pelvis: Secondary | ICD-10-CM | POA: Diagnosis not present

## 2021-10-18 DIAGNOSIS — Z803 Family history of malignant neoplasm of breast: Secondary | ICD-10-CM | POA: Insufficient documentation

## 2021-10-18 DIAGNOSIS — Z808 Family history of malignant neoplasm of other organs or systems: Secondary | ICD-10-CM | POA: Insufficient documentation

## 2021-10-18 DIAGNOSIS — Z8051 Family history of malignant neoplasm of kidney: Secondary | ICD-10-CM | POA: Diagnosis not present

## 2021-10-18 DIAGNOSIS — Z853 Personal history of malignant neoplasm of breast: Secondary | ICD-10-CM | POA: Insufficient documentation

## 2021-10-18 DIAGNOSIS — Z801 Family history of malignant neoplasm of trachea, bronchus and lung: Secondary | ICD-10-CM | POA: Insufficient documentation

## 2021-10-18 NOTE — Progress Notes (Signed)
REFERRING PROVIDER: Derek Jack, MD [address] [place],  [place] [address]  PRIMARY PROVIDER:  Fayrene Helper, MD  PRIMARY REASON FOR VISIT:  1. Malignant neoplasm of left female breast, unspecified estrogen receptor status, unspecified site of breast ([place])   2. Renal cell carcinoma of right kidney ([place])   3. Family history of kidney cancer   4. Family history of colon cancer   5. Family history of prostate cancer    I connected with Ms. Row on 10/18/2021 at 8:45 AM EDT by MyChart video conference and verified that I am speaking with the correct person using two identifiers.    Patient location: [facility] Provider location: [place]:   Ms. Helming, a 61 y.o. female, was seen for a [place] cancer genetics consultation at the request of Dr. Delton Coombes due to a personal and family history of cancer.  Ms. Karan presents to clinic today to discuss the possibility of a hereditary predisposition to cancer, genetic testing, and to further clarify her future cancer risks, as well as potential cancer risks for family members.   At the age of 42, Ms. Beyersdorf was diagnosed with left breast cancer. This was treated with mastectomy. 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 This was treated with radical nephrectomy on 02/06/2021, further treatment being determined.    CANCER HISTORY:  Oncology History   No history exists.     RISK FACTORS:  Menarche was at age 89.  First live birth at age 30.  OCP use for approximately  14  years.  Ovaries intact: yes.  Hysterectomy: no.  Menopausal status: postmenopausal.  HRT use: 0 years. Colonoscopy: yes;  she reports few polyps . Mammogram within the last year: yes. Number of breast biopsies: 1. Up to date with pelvic exams: yes.  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    Cellulitis of leg, left    Complication of  anesthesia    Hard to wake up   Diabetes mellitus without complication ([place])    Pt denies   Family history of breast cancer 08/16/2011   Family history of breast cancer    Family history of colon cancer    Family history of kidney cancer    Family history of prostate cancer    Hyperlipidemia    Hypertension    Hypothyroidism    MRSA (methicillin resistant staph aureus) culture positive 08/19/2011   Pre-diabetes     Past Surgical History:  Procedure Laterality Date   BREAST SURGERY Left    mastectomy   CESAREAN SECTION     x2   COLONOSCOPY N/A 03/03/2020   Procedure: COLONOSCOPY;  Surgeon: Daneil Dolin, MD;  [facility];  Service: Endoscopy;  Laterality: N/A;  12:00   left mastectomy     NEPHRECTOMY Right 02/06/2021   Procedure: NEPHRECTOMY- open radical;  Surgeon: Cleon Gustin, MD;  Location[facility];  Service: Urology;  Laterality: Right;   POLYPECTOMY  03/03/2020   Procedure: POLYPECTOMY;  Surgeon: Daneil Dolin, MD;  [facility];  Service: Endoscopy;;    Social History   Socioeconomic History   Marital status: Married    Spouse name: Not on file   Number of children: 2   Years of education: Not on file   Highest education level: Not on file  Occupational History  Occupation: CNA  Tobacco Use   Smoking status: Former    Packs/day: 0.50    Years: 18.00    Pack years: 9.00    Types: Cigarettes    Quit date: 07/01/2002    Years since quitting: 19.3   Smokeless tobacco: Never  Vaping Use   Vaping Use: Never used  Substance and Sexual Activity   Alcohol use: No   Drug use: No   Sexual activity: Not Currently  Other Topics Concern   Not on file  Social History Narrative   Not on file   Social Determinants of Health   Financial Resource Strain: Not on file  Food Insecurity: Not on file  Transportation Needs: Not on file  Physical Activity: Not on file  Stress: Not on file  Social Connections: Not on file     FAMILY  HISTORY:  We obtained a detailed, 4-generation family history.  Significant diagnoses are listed below: Family History  Problem Relation Age of Onset   Hypertension Mother    Diabetes Mother    Hyperlipidemia Mother    Colon cancer Maternal Aunt        dx 66s   Prostate cancer Maternal Uncle    Throat cancer Maternal Uncle    Kidney cancer Maternal Grandfather    Lung cancer Half-Sister    Kidney cancer Half-Sister 60   Breast cancer Half-Sister 87   Breast cancer Half-Sister 85   Ms. Corkern has 2 sons (38 and 57) and 1 full sister (74). She has 4 maternal half sisters, 4 maternal half brothers, 5 paternal half brothers and 3 paternal half sisters. One of her maternal half sisters died of lung cancer. Another maternal half sister had kidney cancer at 95. A paternal half sister had breast cancer at 60 and another paternal half sister had breast at 64.   Ms. Hulick mother is living at 51 with no history of cancer. Patient has 2 maternal aunts, 2 maternal uncles. One aunt had colon cancer in her 71s and is living at 52. An uncle had prostate cancer and her other uncle had throat cancer. Maternal grandfather had kidney cancer at 49 and died at 105. Maternal grandmother died at 39 of kidney failure.  Ms. Floresca father died at 28 with no history of cancer. Patient had 3 paternal aunts, 4 paternal uncles. None had cancer. No cancer in paternal cousins that she is aware of. Paternal grandparents passed in their 53s.  Ms. Rylee is unaware of previous family history of genetic testing for hereditary cancer risks. There is no reported Ashkenazi Jewish ancestry. There is no known consanguinity.    GENETIC COUNSELING ASSESSMENT: Ms. Daw is a 61 y.o. female with a personal and family history of cancer which is somewhat suggestive of a hereditary cancer syndrome and predisposition to cancer. We, therefore, discussed and recommended the following at today's visit.   DISCUSSION: We discussed that  approximately 10% of breast cancer is hereditary. Most cases of hereditary breast cancer are associated with BRCA1/BRCA2 genes, although there are other genes associated with hereditary cancer as well including genes associated with kidney cancer. Cancers and risks are gene specific.  We discussed that testing is beneficial for several reasons including knowing about other cancer risks, identifying potential screening and risk-reduction options that may be appropriate, and to understand if other family members could be at risk for cancer and allow them to undergo genetic testing.   We reviewed the characteristics, features and inheritance patterns of hereditary cancer syndromes.  We also discussed genetic testing, including the appropriate family members to test, the process of testing, insurance coverage and turn-around-time for results. We discussed the implications of a negative, positive and/or variant of uncertain significant result. We recommended Ms. Foote pursue genetic testing for the [facility] Multi-Cancer+RNA gene panel.   The Multi-Cancer Panel + RNA offered by [facility] includes sequencing and/or deletion duplication testing of the following 84 genes: AIP, ALK, APC, ATM, AXIN2,BAP1,  BARD1, BLM, BMPR1A, BRCA1, BRCA2, BRIP1, CASR, CDC73, CDH1, CDK4, CDKN1B, CDKN1C, CDKN2A (p14ARF), CDKN2A (p16INK4a), CEBPA, CHEK2, CTNNA1, DICER1, DIS3L2, EGFR (c.2369C>T, p.Thr790Met variant only), EPCAM (Deletion/duplication testing only), FH, FLCN, GATA2, GPC3, GREM1 (Promoter region deletion/duplication testing only), HOXB13 (c.251G>A, p.Gly84Glu), HRAS, KIT, MAX, MEN1, MET, MITF (c.952G>A, p.Glu318Lys variant only), MLH1, MSH2, MSH3, MSH6, MUTYH, NBN, NF1, NF2, NTHL1, PALB2, PDGFRA, PHOX2B, PMS2, POLD1, POLE, POT1, PRKAR1A, PTCH1, PTEN, RAD50, RAD51C, RAD51D, RB1, RECQL4, RET, RUNX1, SDHAF2, SDHA (sequence changes only), SDHB, SDHC, SDHD, SMAD4, SMARCA4, SMARCB1, SMARCE1, STK11, SUFU, TERC, TERT, TMEM127, TP53, TSC1,  TSC2, VHL, WRN and WT1.  Based on Ms. Shoe's personal and family history of cancer, she meets medical criteria for genetic testing. Despite that she meets criteria, she may still have an out of pocket cost. We discussed that if her out of pocket cost for testing is over $100, the laboratory will call and confirm whether she wants to proceed with testing.  If the out of pocket cost of testing is less than $100 she will be billed by the genetic testing laboratory.   PLAN: After considering the risks, benefits, and limitations, Ms. Bognar provided informed consent to pursue genetic testing and the blood sample was sent to [facility] for analysis of the Multi-Cancer+RNA panel. Results should be available within approximately 2-3 weeks' time, at which point they will be disclosed by telephone to Ms. Palen, as will any additional recommendations warranted by these results. Ms. Vanaken will receive a summary of her genetic counseling visit and a copy of her results once available. This information will also be available in Epic.   Ms. Mclane questions were answered to her satisfaction today. Our contact information was provided should additional questions or concerns arise. Thank you for the referral and allowing [place] to share in the care of your patient.   Faith Rogue, MS, [facility] Genetic Counselor [email or web address] Phone: [phone]  The patient was seen for a total of 30 minutes in virtual genetic counseling.  Patient was seen alone. Dr. Grayland Ormond was available for discussion regarding this case.   _______________________________________________________________________ For Office Staff:  Number of people involved in session: 1 Was an Intern/ student involved with case: no

## 2021-10-19 ENCOUNTER — Other Ambulatory Visit (HOSPITAL_COMMUNITY): Payer: Self-pay | Admitting: Physician Assistant

## 2021-10-19 ENCOUNTER — Other Ambulatory Visit: Payer: Self-pay | Admitting: Internal Medicine

## 2021-10-22 ENCOUNTER — Other Ambulatory Visit: Payer: Self-pay

## 2021-10-22 ENCOUNTER — Ambulatory Visit (HOSPITAL_COMMUNITY)
Admission: RE | Admit: 2021-10-22 | Discharge: 2021-10-22 | Disposition: A | Discharge status: Home / Self Care | Payer: PRIVATE HEALTH INSURANCE | Source: Ambulatory Visit | Attending: Hematology | Admitting: Hematology

## 2021-10-22 ENCOUNTER — Encounter (HOSPITAL_COMMUNITY): Payer: Self-pay

## 2021-10-22 DIAGNOSIS — C641 Malignant neoplasm of right kidney, except renal pelvis: Secondary | ICD-10-CM | POA: Insufficient documentation

## 2021-10-22 LAB — PROTIME-INR
INR: 0.9 (ref 0.8–1.2)
Prothrombin Time: 12.6 seconds (ref 11.4–15.2)

## 2021-10-22 LAB — CBC
HCT: 32.2 % — ABNORMAL LOW (ref 36.0–46.0)
Hemoglobin: 10.2 g/dL — ABNORMAL LOW (ref 12.0–15.0)
MCH: 29.5 pg (ref 26.0–34.0)
MCHC: 31.7 g/dL (ref 30.0–36.0)
MCV: 93.1 fL (ref 80.0–100.0)
Platelets: 317 10*3/uL (ref 150–400)
RBC: 3.46 MIL/uL — ABNORMAL LOW (ref 3.87–5.11)
RDW: 13.3 % (ref 11.5–15.5)
WBC: 6 10*3/uL (ref 4.0–10.5)
nRBC: 0 % (ref 0.0–0.2)

## 2021-10-22 LAB — GLUCOSE, CAPILLARY: Glucose-Capillary: 129 mg/dL — ABNORMAL HIGH (ref 70–99)

## 2021-10-22 MED ORDER — GELATIN ABSORBABLE 12-7 MM EX MISC
CUTANEOUS | Status: AC
  Filled 2021-10-22: qty 1

## 2021-10-22 MED ORDER — SODIUM CHLORIDE 0.9 % IV SOLN: INTRAVENOUS | Status: DC

## 2021-10-22 MED ORDER — HYDROCODONE-ACETAMINOPHEN 5-325 MG PO TABS
1.0000 | ORAL_TABLET | ORAL | Status: DC | PRN
  Filled 2021-10-22: qty 2

## 2021-10-22 MED ORDER — MIDAZOLAM HCL 2 MG/2ML IJ SOLN
INTRAMUSCULAR | Status: AC | PRN
  Administered 2021-10-22: 1 mg via INTRAVENOUS
  Administered 2021-10-22 (×2): .5 mg via INTRAVENOUS

## 2021-10-22 MED ORDER — MIDAZOLAM HCL 2 MG/2ML IJ SOLN
INTRAMUSCULAR | Status: AC
  Filled 2021-10-22: qty 2

## 2021-10-22 MED ORDER — LIDOCAINE HCL 1 % IJ SOLN
INTRAMUSCULAR | Status: AC
  Administered 2021-10-22: 10 mL via SUBCUTANEOUS
  Filled 2021-10-22: qty 10

## 2021-10-22 MED ORDER — GELATIN ABSORBABLE 12-7 MM EX MISC: 1.0000 | Freq: Once | CUTANEOUS | Status: DC

## 2021-10-22 MED ORDER — FENTANYL CITRATE (PF) 100 MCG/2ML IJ SOLN
INTRAMUSCULAR | Status: AC
  Filled 2021-10-22: qty 2

## 2021-10-22 MED ORDER — FENTANYL CITRATE (PF) 100 MCG/2ML IJ SOLN
INTRAMUSCULAR | Status: AC | PRN
Start: 2021-10-22 — End: 2021-10-22
  Administered 2021-10-22 (×3): 25 ug via INTRAVENOUS

## 2021-10-22 NOTE — Procedures (Signed)
Interventional Radiology Procedure Note  Procedure: CT bx rt nephrectomy bed mass    Complications: None  Estimated Blood Loss:  min  Findings: 18 g core x 3    M. Daryll Brod, MD

## 2021-10-22 NOTE — H&P (Signed)
Chief Complaint: Patient was seen in consultation today for right nephrectomy bed lesion biopsy at the request of [place]  Referring Physician(s): Katragadda,Sreedhar  Supervising Physician: Daryll Brod  Patient Status: [facility] - Out-pt  History of Present Illness: Deanna Bush is a 61 y.o. female   Long family hx cancer Personal Hx Breast Cancer age 89 Recent diagnosis renal cell cancer--- Rt nephrectomy 02/06/21 Follow up CT 09/18/21:  IMPRESSION: 1. Progression nodularity within the RIGHT nephrectomy bed. Now with obvious enlarged enhancing nodules adjacent to the RIGHT hepatic lobe extending inferiorly along the RIGHT retroperitoneum with multiple enlarged enhancing nodules. 2. Direct metastatic invasion into the RIGHT hepatic lobe. 3. Small nodule at the RIGHT lung base is favored benign. Recommend attention on follow-up.  Bone scan 10/05/21:  IMPRESSION: 1. Moderate focal activity in the mental vertex of the mandible, could be due to metastatic disease, traumatic or inflammatory. Further assessment could include plain film correlation, MRI or CT. No other scintigraphic evidence for osteoblastic metastatic disease. 2. Degenerative changes. 3. Activity in the maxillary sinuses most likely indicating mucoperiosteal disease.  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    Cellulitis of leg, left    Complication of anesthesia    Hard to wake up   Diabetes mellitus without complication ([place])    Pt denies   Family history of breast cancer 08/16/2011   Family history of breast cancer    Family history of colon cancer    Family history of kidney cancer    Family history of prostate cancer    Hyperlipidemia    Hypertension    Hypothyroidism    MRSA (methicillin resistant staph aureus) culture positive 08/19/2011    Pre-diabetes     Past Surgical History:  Procedure Laterality Date   BREAST SURGERY Left    mastectomy   CESAREAN SECTION     x2   COLONOSCOPY N/A 03/03/2020   Procedure: COLONOSCOPY;  Surgeon: Daneil Dolin, MD;  [facility];  Service: Endoscopy;  Laterality: N/A;  12:00   left mastectomy     NEPHRECTOMY Right 02/06/2021   Procedure: NEPHRECTOMY- open radical;  Surgeon: Cleon Gustin, MD;  [facility];  Service: Urology;  Laterality: Right;   POLYPECTOMY  03/03/2020   Procedure: POLYPECTOMY;  Surgeon: Daneil Dolin, MD;  [facility];  Service: Endoscopy;;    Allergies: Sulfonamide derivatives and Yellow jacket venom [bee venom]  Medications: Prior to Admission medications   Medication Sig Start Date End Date Taking? Authorizing Provider  amLODipine (NORVASC) 5 MG tablet TAKE 1 TABLET (5 MG TOTAL) BY MOUTH DAILY. 07/31/21  Yes Lindell Spar, MD  atorvastatin (LIPITOR) 10 MG tablet TAKE ONE TABLET BY MOUTH ONCE DAILY Patient taking differently: Take 10 mg by mouth daily. 01/16/21  Yes Fayrene Helper, MD  Iron, Ferrous Sulfate, 325 (65 Fe) MG TABS Take 325 mg by mouth daily. 02/22/21  Yes Lindell Spar, MD  levothyroxine (SYNTHROID) 88 MCG tablet TAKE 1 TABLET(88 MCG) BY MOUTH DAILY BEFORE AND BREAKFAST 09/12/21  Yes Nida, Marella Chimes, MD  EPINEPHrine 0.3 mg/0.3 mL IJ SOAJ injection Inject 0.3 mg into the muscle as needed for anaphylaxis. 02/22/21   Lindell Spar, MD  UNABLE TO FIND MASTECTOMY [place] AND PROTHESIS DX M01.02 10/16/17   Fayrene Helper, MD  Karen Kays  TO FIND 6 mastectomy prosthesis and bras. 06/28/20   Fayrene Helper, MD     Family History  Problem Relation Age of Onset   Hypertension Mother    Diabetes Mother    Hyperlipidemia Mother    Colon cancer Maternal Aunt        dx 74s   Prostate cancer Maternal Uncle    Throat cancer Maternal Uncle    Kidney cancer Maternal Grandfather    Lung cancer Half-Sister     Kidney cancer Half-Sister 87   Breast cancer Half-Sister 43   Breast cancer Half-Sister 80    Social History   Socioeconomic History   Marital status: Married    Spouse name: Not on file   Number of children: 2   Years of education: Not on file   Highest education level: Not on file  Occupational History   Occupation: CNA  Tobacco Use   Smoking status: Former    Packs/day: 0.50    Years: 18.00    Pack years: 9.00    Types: Cigarettes    Quit date: 07/01/2002    Years since quitting: 19.3   Smokeless tobacco: Never  Vaping Use   Vaping Use: Never used  Substance and Sexual Activity   Alcohol use: No   Drug use: No   Sexual activity: Not Currently  Other Topics Concern   Not on file  Social History Narrative   Not on file   Social Determinants of Health   Financial Resource Strain: Not on file  Food Insecurity: Not on file  Transportation Needs: Not on file  Physical Activity: Not on file  Stress: Not on file  Social Connections: Not on file   Review of Systems: A 12 point ROS discussed and pertinent positives are indicated in the HPI above.  All other systems are negative.  Review of Systems  Constitutional:  Negative for activity change, appetite change, fatigue and fever.  Respiratory:  Negative for cough and shortness of breath.   Cardiovascular:  Negative for chest pain.  Gastrointestinal:  Negative for abdominal pain.  Musculoskeletal:  Negative for back pain.  Neurological:  Negative for weakness.  Psychiatric/Behavioral:  Negative for behavioral problems and confusion.    Vital Signs: BP 126/71    Pulse 62    Temp 98.5 F (36.9 C) (Oral)    Resp 16    Ht 5\' 6"  (1.676 m)    Wt 191 lb (86.6 kg)    SpO2 98%    BMI 30.83 kg/m   Physical Exam Vitals reviewed.  HENT:     Mouth/Throat:     Mouth: Mucous membranes are moist.  Cardiovascular:     Rate and Rhythm: Normal rate and regular rhythm.     Heart sounds: Normal heart sounds.  Pulmonary:      Effort: Pulmonary effort is normal.     Breath sounds: Normal breath sounds.  Abdominal:     Palpations: Abdomen is soft.  Musculoskeletal:        General: Normal range of motion.  Skin:    General: Skin is warm.  Neurological:     Mental Status: She is alert and oriented to person, place, and time.  Psychiatric:        Behavior: Behavior normal.    Imaging: CT Chest W Contrast  Result Date: 10/04/2021 CLINICAL DATA:  61 year old female with history of clear cell carcinoma of the kidney status post right nephrectomy in May 2022. Additional history of left-sided breast cancer  status post mastectomy. EXAM: CT CHEST WITH CONTRAST TECHNIQUE: Multidetector CT imaging of the chest was performed during intravenous contrast administration. CONTRAST:  58mL OMNIPAQUE IOHEXOL 300 MG/ML  SOLN COMPARISON:  Chest CTA 08/09/2010. FINDINGS: Cardiovascular: Heart size is normal. There is no significant pericardial fluid, thickening or pericardial calcification. Aortic atherosclerosis. No definite coronary artery calcifications. Mediastinum/Nodes: No pathologically enlarged mediastinal, internal mammary or hilar lymph nodes. Esophagus is unremarkable in appearance. No axillary lymphadenopathy. Status post left axillary lymph node dissection. Lungs/Pleura: Scattered nodularity is noted in both lungs. Some nodules appear relatively isolated and rounded in appearance, such as a small nodule in the left lower lobe near the base measuring 5 mm (axial image 115 of series 4). Other nodules are larger, and part of clustered nodularity, the appearance of which is suggestive of an area of mucoid impaction. The best example of this is in the elongated nodular density in the right lower lobe measuring up to 1.6 x 0.6 cm (axial image 86 of series 4) with surrounding smaller micro nodularity, as well as a cluster of nodules in the medial aspect of the left upper lobe, largest of which measures 1.4 x 0.9 cm (axial image 68 of  series 4). No consolidative airspace disease. No pleural effusions. Upper Abdomen: Irregular enhancing soft tissue in the upper right retroperitoneum infiltrative into the posterior aspect of the right lobe of the liver, incompletely imaged and better demonstrated on recent CT of the abdomen and pelvis 09/18/2021. Musculoskeletal: Status post left modified radical mastectomy. There are no aggressive appearing lytic or blastic lesions noted in the visualized portions of the skeleton. IMPRESSION: 1. There are several pulmonary nodules scattered throughout the lungs bilaterally, some of which could be indicative of metastatic disease to the lungs, however, many of these nodules appear to reflect areas of mucoid impaction, potentially from atypical infection. Close attention on follow-up studies is recommended to ensure the stability or regression of these lesions. 2. Locally recurrent disease in the upper right retroperitoneum involving the right lobe of the liver redemonstrated, better characterized on CT the abdomen and pelvis 09/18/2021. 3. Aortic atherosclerosis. Aortic Atherosclerosis (ICD10-I70.0). Electronically Signed   By: Vinnie Langton M.D.   On: 10/04/2021 05:07   NM Bone Scan Whole Body  Result Date: 10/06/2021 CLINICAL DATA:  The patient has a history of clear cell carcinoma of the right kidney and left-sided breast cancer status post mastectomy. EXAM: NUCLEAR MEDICINE WHOLE BODY BONE SCAN TECHNIQUE: Whole body anterior and posterior images were obtained approximately 3 hours after intravenous injection of radiopharmaceutical. RADIOPHARMACEUTICALS:  21.0 mCi Technetium-46m MDP IV COMPARISON:  No prior bone scan. Correlatives imaging is available with CT of the abdomen and pelvis without and with contrast 09/18/2021, and a chest CT of 10/03/2021. FINDINGS: There is mild activity in the maxillary sinuses which is likely due to mucoperiosteal disease. Activity consistent with degenerative arthrosis is  noted in both shoulders including the sternoclavicular joints, in both knees, ankles and the midfoot regions as well as the bilateral first MTP joints. There is moderate focal uptake abnormality in the area of the mental vertex of the mandible. No other focal abnormal uptake worrisome for osteoblastic metastatic disease is seen. The soft tissue and renal activity are unremarkable. IMPRESSION: 1. Moderate focal activity in the mental vertex of the mandible, could be due to metastatic disease, traumatic or inflammatory. Further assessment could include plain film correlation, MRI or CT. No other scintigraphic evidence for osteoblastic metastatic disease. 2. Degenerative changes. 3. Activity in  the maxillary sinuses most likely indicating mucoperiosteal disease. Electronically Signed   By: Telford Nab M.D.   On: 10/06/2021 22:58    Labs:  CBC: Recent Labs    02/13/21 0342 02/21/21 1355 07/10/21 1530 09/27/21 0922  WBC 7.1 6.8 5.8 5.4  HGB 8.1* 8.9* 11.1 10.9*  HCT 25.8* 28.1* 33.7* 34.6*  PLT 391 409 374 320    COAGS: Recent Labs    02/02/21 1331  INR 1.0  APTT 34    BMP: Recent Labs    10/30/20 0914 12/23/20 2027 02/11/21 0316 02/12/21 0314 02/13/21 0342 02/21/21 1355 05/28/21 1116 05/30/21 1111 07/10/21 1530 09/18/21 1538 09/27/21 0922  NA 140   < > 138 136 136 138  --  141 138  --  136  K 4.3   < > 3.8 3.7 4.1 4.5  --  4.7 4.6  --  4.1  CL 103   < > 106 102 103 102  --  104 101  --  104  CO2 25   < > 26 27 28 21   --  24 22  --  25  GLUCOSE 119*   < > 118* 106* 101* 97  --  101* 82  --  121*  BUN 13   < > 12 11 16 16   --  19 18  --  15  CALCIUM 9.8   < > 8.8* 8.9 9.3 9.6  --  9.5 9.8  --  9.4  CREATININE 0.77   < > 1.16* 1.07* 1.18* 1.06*   < > 1.04* 1.05* 1.20* 0.91  GFRNONAA 84   < > 54* 59* 53*  --   --   --   --   --  >60  GFRAA 96  --   --   --   --   --   --   --   --   --   --    < > = values in this interval not displayed.    LIVER FUNCTION TESTS: Recent  Labs    02/21/21 1355 05/30/21 1111 07/10/21 1530 09/27/21 0922  BILITOT <0.2 <0.2 0.3 0.3  AST 19 12 16 15   ALT 24 12 12 11   ALKPHOS 103 98 98 96  PROT 7.4 7.4 7.4 7.9  ALBUMIN 4.0 4.6 4.4 3.9    TUMOR MARKERS: No results for input(s): AFPTM, CEA, CA199, CHROMGRNA in the last 8760 hours.  Assessment and Plan:  Right nephrectomy bed lesion-- invading liver  Risks and benefits of right nephrectomy bed lesion biopsy was discussed with the patient and/or patient's family including, but not limited to bleeding, infection, damage to adjacent structures or low yield requiring additional tests.  All of the questions were answered and there is agreement to proceed. Consent signed and in chart.    Thank you for this interesting consult.  I greatly enjoyed meeting Deanna Bush and look forward to participating in their care.  A copy of this report was sent to the requesting provider on this date.  Electronically Signed: Lavonia Drafts, PA-C 10/22/2021, 7:19 AM   I spent a total of  30 Minutes   in face to face in clinical consultation, greater than 50% of which was counseling/coordinating care for right nephrectomy bed lesion biopsy

## 2021-10-23 LAB — SURGICAL PATHOLOGY

## 2021-10-30 ENCOUNTER — Other Ambulatory Visit: Payer: Self-pay

## 2021-10-30 ENCOUNTER — Inpatient Hospital Stay (HOSPITAL_BASED_OUTPATIENT_CLINIC_OR_DEPARTMENT_OTHER): Payer: No Typology Code available for payment source | Admitting: Hematology

## 2021-10-30 ENCOUNTER — Encounter (HOSPITAL_COMMUNITY): Payer: Self-pay

## 2021-10-30 ENCOUNTER — Other Ambulatory Visit (HOSPITAL_COMMUNITY): Payer: Self-pay

## 2021-10-30 VITALS — BP 145/70 | HR 75 | Temp 97.6°F | Resp 20 | Ht 65.75 in | Wt 196.6 lb

## 2021-10-30 DIAGNOSIS — Z801 Family history of malignant neoplasm of trachea, bronchus and lung: Secondary | ICD-10-CM | POA: Diagnosis not present

## 2021-10-30 DIAGNOSIS — Z853 Personal history of malignant neoplasm of breast: Secondary | ICD-10-CM | POA: Diagnosis not present

## 2021-10-30 DIAGNOSIS — C50912 Malignant neoplasm of unspecified site of left female breast: Secondary | ICD-10-CM | POA: Diagnosis not present

## 2021-10-30 DIAGNOSIS — C641 Malignant neoplasm of right kidney, except renal pelvis: Secondary | ICD-10-CM

## 2021-10-30 DIAGNOSIS — Z800 Family history of malignant neoplasm of digestive organs: Secondary | ICD-10-CM | POA: Diagnosis not present

## 2021-10-30 DIAGNOSIS — Z87891 Personal history of nicotine dependence: Secondary | ICD-10-CM | POA: Diagnosis not present

## 2021-10-30 DIAGNOSIS — Z803 Family history of malignant neoplasm of breast: Secondary | ICD-10-CM | POA: Diagnosis not present

## 2021-10-30 DIAGNOSIS — Z8042 Family history of malignant neoplasm of prostate: Secondary | ICD-10-CM | POA: Diagnosis not present

## 2021-10-30 DIAGNOSIS — Z8051 Family history of malignant neoplasm of kidney: Secondary | ICD-10-CM | POA: Diagnosis not present

## 2021-10-30 DIAGNOSIS — Z808 Family history of malignant neoplasm of other organs or systems: Secondary | ICD-10-CM | POA: Diagnosis not present

## 2021-10-30 MED ORDER — LIDOCAINE-PRILOCAINE 2.5-2.5 % EX CREA: TOPICAL_CREAM | CUTANEOUS | 3 refills | Status: DC

## 2021-10-30 NOTE — Progress Notes (Signed)
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    CANCER STAGING:  Cancer Staging  Malignant neoplasm of female breast [facility]) Staging form: Breast, AJCC 6th Edition - Clinical: Stage I (T1b, N0, M0) - Signed by Baird Cancer, PA on 08/16/2011  Renal cell carcinoma of right kidney [facility]) Staging form: Kidney, AJCC 8th Edition - Clinical stage from 09/26/2021: Stage IV (cT3c, cNX, cM1) - Unsigned   INTERVAL HISTORY:  Ms. AAYLIAH ROTENBERRY, a 61 y.o. female, returns for routine follow-up of her metastatic clear-cell renal cell carcinoma. Emiah was last seen on 09/27/2021.   Today she reports feeling good. She denies any current pains.   REVIEW OF SYSTEMS:  Review of Systems  Constitutional:  Negative for appetite change and fatigue.  All other systems reviewed and are negative.  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[place])    left    Cellulitis of leg, left    Complication of anesthesia    Hard to wake up   Diabetes mellitus without complication ([place])    Pt denies   Family history of breast cancer 08/16/2011   Family history of breast cancer    Family history of colon cancer    Family history of kidney cancer    Family history of prostate cancer    Hyperlipidemia    Hypertension    Hypothyroidism    MRSA (methicillin resistant staph aureus) culture positive 08/19/2011   Pre-diabetes    Past Surgical History:  Procedure Laterality Date   BREAST SURGERY Left    mastectomy   CESAREAN SECTION     x2   COLONOSCOPY N/A 03/03/2020   Procedure: COLONOSCOPY;  Surgeon: Daneil Dolin,  MD;  Location: [facility];  Service: Endoscopy;  Laterality: N/A;  12:00   left mastectomy     NEPHRECTOMY Right 02/06/2021   Procedure: NEPHRECTOMY- open radical;  Surgeon: Cleon Gustin, MD;  Location: [facility];  Service: Urology;  Laterality: Right;   POLYPECTOMY  03/03/2020   Procedure: POLYPECTOMY;  Surgeon: Daneil Dolin, MD;  Location: [facility];  Service: Endoscopy;;    SOCIAL HISTORY:  Social History   Socioeconomic History   Marital status: Married    Spouse name: Not on file   Number of children: 2   Years of education: Not on file   Highest education level: Not on file  Occupational History   Occupation: CNA  Tobacco Use   Smoking status: Former    Packs/day: 0.50    Years: 18.00    Pack years: 9.00    Types: Cigarettes    Quit date: 07/01/2002    Years since quitting: 19.3   Smokeless tobacco: Never  Vaping Use   Vaping Use: Never used  Substance and Sexual Activity   Alcohol use: No   Drug use: No   Sexual activity: Not Currently  Other Topics Concern   Not on file  Social History Narrative   Not on file   Social Determinants of Health   Financial Resource Strain: Not on file  Food Insecurity: Not on file  Transportation Needs:  Not on file  Physical Activity: Not on file  Stress: Not on file  Social Connections: Not on file  Intimate Partner Violence: Not on file    FAMILY HISTORY:  Family History  Problem Relation Age of Onset   Hypertension Mother    Diabetes Mother    Hyperlipidemia Mother    Colon cancer Maternal Aunt        dx 35s   Prostate cancer Maternal Uncle    Throat cancer Maternal Uncle    Kidney cancer Maternal Grandfather    Lung cancer Half-Sister    Kidney cancer Half-Sister 21   Breast cancer Half-Sister 58   Breast cancer Half-Sister 69    CURRENT MEDICATIONS:  Current Outpatient Medications  Medication Sig Dispense Refill   amLODipine (NORVASC) 5 MG tablet TAKE 1 TABLET (5 MG TOTAL) BY MOUTH DAILY. 90  tablet 3   atorvastatin (LIPITOR) 10 MG tablet TAKE ONE TABLET BY MOUTH ONCE DAILY (Patient taking differently: Take 10 mg by mouth daily.) 90 tablet 3   EPINEPHrine 0.3 mg/0.3 mL IJ SOAJ injection Inject 0.3 mg into the muscle as needed for anaphylaxis. 2 each 0   Iron, Ferrous Sulfate, 325 (65 Fe) MG TABS Take 325 mg by mouth daily. 30 tablet 3   levothyroxine (SYNTHROID) 88 MCG tablet TAKE 1 TABLET(88 MCG) BY MOUTH DAILY BEFORE AND BREAKFAST 90 tablet 1   UNABLE TO FIND MASTECTOMY BRA AND PROTHESIS DX Z90.12 6 each 2   UNABLE TO FIND 6 mastectomy prosthesis and bras. 6 each 0   No current facility-administered medications for this visit.    ALLERGIES:  Allergies  Allergen Reactions   Sulfonamide Derivatives Itching   Yellow Jacket Venom [Bee Venom] Rash    urticaria    PHYSICAL EXAM:  Performance status (ECOG): 0 - Asymptomatic  Vitals:   10/30/21 1101  BP: (!) 145/70  Pulse: 75  Resp: 20  Temp: 97.6 F (36.4 C)  SpO2: 98%   Wt Readings from Last 3 Encounters:  10/30/21 196 lb 9.6 oz (89.2 kg)  10/22/21 191 lb (86.6 kg)  09/27/21 195 lb 12.8 oz (88.8 kg)   Physical Exam Vitals reviewed.  Constitutional:      Appearance: Normal appearance. She is obese.  Cardiovascular:     Rate and Rhythm: Normal rate and regular rhythm.     Pulses: Normal pulses.     Heart sounds: Normal heart sounds.  Pulmonary:     Effort: Pulmonary effort is normal.     Breath sounds: Normal breath sounds.  Neurological:     General: No focal deficit present.     Mental Status: She is alert and oriented to person, place, and time.  Psychiatric:        Mood and Affect: Mood normal.        Behavior: Behavior normal.     LABORATORY DATA:  I have reviewed the labs as listed.  CBC Latest Ref Rng & Units 10/22/2021 09/27/2021 07/10/2021  WBC 4.0 - 10.5 K/uL 6.0 5.4 5.8  Hemoglobin 12.0 - 15.0 g/dL 10.2(L) 10.9(L) 11.1  Hematocrit 36.0 - 46.0 % 32.2(L) 34.6(L) 33.7(L)  Platelets 150 - 400  K/uL 317 320 374   CMP Latest Ref Rng & Units 09/27/2021 09/18/2021 07/10/2021  Glucose 70 - 99 mg/dL 121(H) - 82  BUN 8 - 23 mg/dL 15 - 18  Creatinine 0.44 - 1.00 mg/dL 0.91 1.20(H) 1.05(H)  Sodium 135 - 145 mmol/L 136 - 138  Potassium 3.5 - 5.1 mmol/L  4.1 - 4.6  Chloride 98 - 111 mmol/L 104 - 101  CO2 22 - 32 mmol/L 25 - 22  Calcium 8.9 - 10.3 mg/dL 9.4 - 9.8  Total Protein 6.5 - 8.1 g/dL 7.9 - 7.4  Total Bilirubin 0.3 - 1.2 mg/dL 0.3 - 0.3  Alkaline Phos 38 - 126 U/L 96 - 98  AST 15 - 41 U/L 15 - 16  ALT 0 - 44 U/L 11 - 12    DIAGNOSTIC IMAGING:  I have independently reviewed the scans and discussed with the patient. CT Chest W Contrast  Result Date: 10/04/2021 CLINICAL DATA:  61 year old female with history of clear cell carcinoma of the kidney status post right nephrectomy in May 2022. Additional history of left-sided breast cancer status post mastectomy. EXAM: CT CHEST WITH CONTRAST TECHNIQUE: Multidetector CT imaging of the chest was performed during intravenous contrast administration. CONTRAST:  55m OMNIPAQUE IOHEXOL 300 MG/ML  SOLN COMPARISON:  Chest CTA 08/09/2010. FINDINGS: Cardiovascular: Heart size is normal. There is no significant pericardial fluid, thickening or pericardial calcification. Aortic atherosclerosis. No definite coronary artery calcifications. Mediastinum/Nodes: No pathologically enlarged mediastinal, internal mammary or hilar lymph nodes. Esophagus is unremarkable in appearance. No axillary lymphadenopathy. Status post left axillary lymph node dissection. Lungs/Pleura: Scattered nodularity is noted in both lungs. Some nodules appear relatively isolated and rounded in appearance, such as a small nodule in the left lower lobe near the base measuring 5 mm (axial image 115 of series 4). Other nodules are larger, and part of clustered nodularity, the appearance of which is suggestive of an area of mucoid impaction. The best example of this is in the elongated nodular  density in the right lower lobe measuring up to 1.6 x 0.6 cm (axial image 86 of series 4) with surrounding smaller micro nodularity, as well as a cluster of nodules in the medial aspect of the left upper lobe, largest of which measures 1.4 x 0.9 cm (axial image 68 of series 4). No consolidative airspace disease. No pleural effusions. Upper Abdomen: Irregular enhancing soft tissue in the upper right retroperitoneum infiltrative into the posterior aspect of the right lobe of the liver, incompletely imaged and better demonstrated on recent CT of the abdomen and pelvis 09/18/2021. Musculoskeletal: Status post left modified radical mastectomy. There are no aggressive appearing lytic or blastic lesions noted in the visualized portions of the skeleton. IMPRESSION: 1. There are several pulmonary nodules scattered throughout the lungs bilaterally, some of which could be indicative of metastatic disease to the lungs, however, many of these nodules appear to reflect areas of mucoid impaction, potentially from atypical infection. Close attention on follow-up studies is recommended to ensure the stability or regression of these lesions. 2. Locally recurrent disease in the upper right retroperitoneum involving the right lobe of the liver redemonstrated, better characterized on CT the abdomen and pelvis 09/18/2021. 3. Aortic atherosclerosis. Aortic Atherosclerosis (ICD10-I70.0). Electronically Signed   By: DVinnie LangtonM.D.   On: 10/04/2021 05:07   CT GUIDED NEEDLE PLACEMENT  Result Date: 10/22/2021 INDICATION: Renal cell carcinoma, right nephrectomy site masslike abnormalities by CT consistent with local recurrence. EXAM: CT CORE BIOPSIES RIGHT NEPHRECTOMY BED RECURRENT MASS MEDICATIONS: 1% LIDOCAINE LOCAL ANESTHESIA/SEDATION: Moderate (conscious) sedation was employed during this procedure. A total of Versed 2.0 mg and Fentanyl 75 mcg was administered intravenously by the radiology nurse. Total intra-service moderate  Sedation Time: 16 minutes. [name] level of consciousness and vital signs were monitored continuously by radiology nursing throughout the procedure under my  direct supervision. FLUOROSCOPY TIME:  Fluoroscopy Time: NONE. COMPLICATIONS: None immediate. PROCEDURE: Informed written consent was obtained from the patient after a thorough discussion of the procedural risks, benefits and alternatives. All questions were addressed. Maximal Sterile Barrier Technique was utilized including caps, mask, sterile gowns, sterile gloves, sterile drape, hand hygiene and skin antiseptic. A timeout was performed prior to the initiation of the procedure. Previous imaging reviewed. Preliminary ultrasound performed. Patient positioned prone. Noncontrast localization CT performed. The nodular mass in the right nephrectomy site was localized and marked for a posterior approach. Under sterile conditions and local anesthesia, a 17 gauge 11.8 cm access needle was advanced from a posterior approach to the right nephrectomy bed lesion. Needle position confirmed with CT. 18 gauge core biopsies obtained. Samples were intact and non fragmented. These were placed in formalin. Needle tract occluded with Gel-Foam. Postprocedure imaging demonstrates no hemorrhage or hematoma. Patient tolerated the biopsy well. IMPRESSION: Successful CT-guided right nephrectomy bed mass 18 gauge core biopsy Electronically Signed   By: Jerilynn Mages.  Shick M.D.   On: 10/22/2021 15:12   NM Bone Scan Whole Body  Result Date: 10/06/2021 CLINICAL DATA:  The patient has a history of clear cell carcinoma of the right kidney and left-sided breast cancer status post mastectomy. EXAM: NUCLEAR MEDICINE WHOLE BODY BONE SCAN TECHNIQUE: Whole body anterior and posterior images were obtained approximately 3 hours after intravenous injection of radiopharmaceutical. RADIOPHARMACEUTICALS:  21.0 mCi Technetium-50mMDP IV COMPARISON:  No prior bone scan. Correlatives imaging is available  with CT of the abdomen and pelvis without and with contrast 09/18/2021, and a chest CT of 10/03/2021. FINDINGS: There is mild activity in the maxillary sinuses which is likely due to mucoperiosteal disease. Activity consistent with degenerative arthrosis is noted in both shoulders including the sternoclavicular joints, in both knees, ankles and the midfoot regions as well as the bilateral first MTP joints. There is moderate focal uptake abnormality in the area of the mental vertex of the mandible. No other focal abnormal uptake worrisome for osteoblastic metastatic disease is seen. The soft tissue and renal activity are unremarkable. IMPRESSION: 1. Moderate focal activity in the mental vertex of the mandible, could be due to metastatic disease, traumatic or inflammatory. Further assessment could include plain film correlation, MRI or CT. No other scintigraphic evidence for osteoblastic metastatic disease. 2. Degenerative changes. 3. Activity in the maxillary sinuses most likely indicating mucoperiosteal disease. Electronically Signed   By: KTelford NabM.D.   On: 10/06/2021 22:58     ASSESSMENT:  Metastatic clear-cell renal cell carcinoma: - Right radical nephrectomy on 02/06/2021. - Pathology shows clear-cell RCC, grade 4, 12.5 cm, necrosis and rhabdoid features are identified.  Tumor extends into the and invades the wall of the vena cava (pT3c).  Vascular, ureteral and all margins of resection are negative for tumor. - CT scan abdomen with and without contrast on 05/29/2021 showed several nodules along the peritoneal surface of the right nephrectomy bed warranting close attention. - CTAP with and without contrast on 09/18/2021 showed progressive nodularity within the right nephrectomy bed, with enlarged nodules adjacent to the right hepatic lobe extending inferiorly along the right retroperitoneum with multiple enlarged enhancing nodules.  Direct metastatic invasion into the right hepatic lobe.  Small nodule  at the right lung base favored to be benign. - IMDC: Intermediate risk group with 2 features including diagnosis less than 1 year and hemoglobin lower than normal. - Bone scan on 10/05/2021 with focal activity in the mental vertex of the  mandible which could be inflammatory/traumatic/metastatic. - CT chest on 10/03/2021 showed several lung nodules scattered bilaterally some of which could be metastatic and many others could be due to mucoid impaction.    Social/family history: - She lives at home with her husband and also takes care of her mother. - She works as a Buyer, retail at [employer].  She quit smoking in 2004 and smoked half pack per day for 10 years. - Half sister (same mother) had kidney cancer at age 45.  Maternal grandfather had kidney cancer.  Sister died of lung cancer.  2 half sisters (same father) had breast cancers.  3.  Left breast stage I tubular adenocarcinoma: - She underwent left mastectomy on 06/11/2002.  0/6 lymph nodes positive.  ER 90%, PR 92%, Ki-67 6%, HER2 negative.  As per chart, declined antiestrogen therapy.   PLAN:  Metastatic clear-cell RCC, intermediate risk by IMDC: -We have reviewed biopsy results of the nephrectomy bed mass which was consistent with clear-cell renal cell carcinoma with necrosis. - Bone scan showed focal activity in the mental vertex of the mandible which could be inflammatory/traumatic/metastatic.  No other areas of uptake. - CT chest on 10/03/2021 showed several lung nodules scattered bilaterally some of which could be metastatic and many others could be due to mucoid impaction. - We talked about treatment of her metastatic clear-cell RCC in the palliative setting.  We talked options including combination of immunotherapy and VEGF TKI versus combination immunotherapy with nivolumab and ipilimumab.  The advantage with the latter regimen is potential for treatment free interval or even curative intent therapy with acceptable  toxicities. - We discussed side effects of immunotherapy regimen in detail including rare chance of pneumonitis, colitis, hypophysitis, myocarditis among others. - She wishes to continue to work during her treatments. - We will ask for port placement and proceed with her first cycle. - We will follow-up on genetics testing results.  We will also consider sending NGS testing in the future to open up treatment options. - She is completely asymptomatic at this time.  I plan to see her back on day 1 cycle 2.   Orders placed this encounter:  Orders Placed This Encounter  Procedures   IR IMAGING GUIDED PORT INSERTION     Derek Jack, MD [place] [phone]   I, Thana Ates, am acting as a scribe for Dr. Derek Jack.  I, Derek Jack MD, have reviewed the above documentation for accuracy and completeness, and I agree with the above.

## 2021-10-30 NOTE — Addendum Note (Signed)
Addended by: Brien Mates on: 10/30/2021 02:01 PM   Modules accepted: Orders

## 2021-10-30 NOTE — Patient Instructions (Addendum)
[place] at [facility] Discharge Instructions   You were seen and examined today by Dr. Delton Coombes.  He discussed the biopsy results with you.  He discussed treatment with immunotherapy + a pill, or two immunotherapy drugs you will get IV in the clinic. We will start out with the two immunotherapy drugs given in the clinic.  We will refer you for port a cath placement prior to treatment.   Return as scheduled for lab work, office visit, and treatments.    Thank you for choosing [place] at [facility] to provide your oncology and hematology care.  To afford each patient quality time with our provider, please arrive at least 15 minutes before your scheduled appointment time.   If you have a lab appointment with the [facility] please come in thru the Main Entrance and check in at the main information desk.  You need to re-schedule your appointment should you arrive 10 or more minutes late.  We strive to give you quality time with our providers, and arriving late affects you and other patients whose appointments are after yours.  Also, if you no show three or more times for appointments you may be dismissed from the clinic at the providers discretion.     Again, thank you for choosing [facility].  Our hope is that these requests will decrease the amount of time that you wait before being seen by our physicians.       _____________________________________________________________  Should you have questions after your visit to [facility], please contact our office at [phone] and follow the prompts.  Our office hours are 8:00 a.m. and 4:30 p.m. Monday - Friday.  Please note that voicemails left after 4:00 p.m. may not be returned until the following business day.  We are closed weekends and major holidays.  You do have access to a nurse 24-7, just call the main number to the clinic [phone] and do not press  any options, hold on the line and a nurse will answer the phone.    For prescription refill requests, have your pharmacy contact our office and allow 72 hours.    Due to Covid, you will need to wear a mask upon entering the hospital. If you do not have a mask, a mask will be given to you at the Main Entrance upon arrival. For doctor visits, patients may have 1 support person age 48 or older with them. For treatment visits, patients can not have anyone with them due to social distancing guidelines and our immunocompromised population.

## 2021-10-30 NOTE — Progress Notes (Signed)
START ON PATHWAY REGIMEN - Renal Cell     A cycle is every 21 days:     Nivolumab      Ipilimumab    A cycle is every 28 days:     Nivolumab   **Always confirm dose/schedule in your pharmacy ordering system**  Patient Characteristics: Stage IV (Unresected T4M0 or Any T, M1)/Metastatic Disease, Clear Cell, First Line, Intermediate or Poor Risk Therapeutic Status: Stage IV (Unresected T4M0 or Any T, M1)/Metastatic Disease Histology: Clear Cell Line of Therapy: First Line Risk Status: Intermediate Risk Intent of Therapy: Non-Curative / Palliative Intent, Discussed with Patient

## 2021-10-31 ENCOUNTER — Encounter: Payer: Self-pay | Admitting: Urology

## 2021-10-31 ENCOUNTER — Ambulatory Visit (INDEPENDENT_AMBULATORY_CARE_PROVIDER_SITE_OTHER): Payer: No Typology Code available for payment source | Admitting: Urology

## 2021-10-31 ENCOUNTER — Encounter (HOSPITAL_COMMUNITY): Payer: Self-pay

## 2021-10-31 DIAGNOSIS — C641 Malignant neoplasm of right kidney, except renal pelvis: Secondary | ICD-10-CM | POA: Diagnosis not present

## 2021-10-31 DIAGNOSIS — N2889 Other specified disorders of kidney and ureter: Secondary | ICD-10-CM

## 2021-10-31 NOTE — Patient Instructions (Signed)
Kidney Cancer ?Kidney cancer is an abnormal growth of cells in one or both kidneys. The kidneys filter waste from your blood and produce urine. Kidney cancer may spread to other parts of your body. This type of cancer may also be called renal cell carcinoma. ?What are the causes? ?The cause of this condition is not always known. In some cases, abnormal changes to genes (genetic mutations) can cause cells to form cancer. ?What increases the risk? ?You may be more likely to develop kidney cancer if you: ?Are over age 60. The risk increases with age. ?Have a family history of kidney cancer. ?Are of African-American, Native American, or Native [place] descent. ?Smoke. ?Are female. ?Are obese. ?Have high blood pressure (hypertension). ?Have advanced kidney disease, especially if you need long-term dialysis. ?Have certain conditions that are passed from parent to child (inherited), such as von Hippel-Lindau disease, tuberous sclerosis, or hereditary papillary renal carcinoma. ?Have been exposed to certain chemicals. ?What are the signs or symptoms? ?In the early stages, kidney cancer does not cause symptoms. As the cancer grows, symptoms may include: ?Blood in the urine. ?Pain in the upper back or abdomen, just below the rib cage. You may feel pain on one or both sides of the body. ?Fatigue. ?Unexplained weight loss. ?Fever. ?How is this diagnosed? ?This condition may be diagnosed based on: ?Your symptoms and medical history. ?A physical exam. ?Blood and urine tests. ?X-rays. ?Imaging tests, such as CT scans, MRIs, and PET scans. ?Having dye injected into your blood through an IV, and then having X-rays taken of: ?Your kidneys and the rest of the organs involved in making and storing urine (intravenous pyelogram). ?Your blood vessels (angiogram). ?Removal and testing of a kidney tissue sample (biopsy). ?Your cancer will be assessed (staged), based on how severe it is and how much it has spread. ?How is this  treated? ?Treatment depends on the type and stage of the cancer. Treatment may include one or more of the following: ?Surgery. This may include surgery to remove: ?Just the tumor (nephron-sparing surgery). ?The entire kidney (nephrectomy). ?The kidney, some of the surrounding healthy tissue, nearby lymph nodes, and the adrenal gland in certain cases (radical nephrectomy). ?Medicines that kill cancer cells (chemotherapy). ?High-energy rays that kill cancer cells (radiation therapy). ?Targeted therapy. This targets specific parts of cancer cells and the area around them to block the growth and the spread of the cancer. Targeted therapy can help to limit the damage to healthy cells. ?Medicines that help your body's disease-fighting system (immune system) fight cancer cells (immunotherapy). ?Freezing cancer cells using gas or liquid that is delivered through a needle (cryoablation). ?Destroying cancer cells using high-energy radio waves that are delivered through a needle-like probe (radiofrequency ablation). ?A procedure to block the artery that supplies blood to the tumor, which kills the cancer cells (embolization). ?Follow these instructions at home: ?Eating and drinking ?Some of your treatments might affect your appetite and your ability to chew and swallow. If you are having problems eating, or if you do not have an appetite, meet with a diet and nutrition specialist (dietitian). ?If you have side effects that affect eating, it may help to: ?Eat smaller meals and snacks often. ?Drink high-nutrition and high-calorie shakes or supplements. ?Eat bland and soft foods that are easy to eat. ?Not eat foods that are hot, spicy, or hard to swallow. ?Lifestyle ?Do not drink alcohol. ?Do not use any products that contain nicotine or tobacco, such as cigarettes and e-cigarettes. If you need help   quitting, ask your health care provider. ?General instructions ? ?Take over-the-counter and prescription medicines only as told by  your health care provider. This includes vitamins, supplements, and herbal products. ?Consider joining a support group to help you cope with the stress of having kidney cancer. ?Work with your health care provider to manage any side effects of treatment. ?Keep all follow-up visits as told by your health care provider. This is important. ?Where to find more information ?American Cancer Society: https://www.cancer.org ?National Cancer Institute (NCI): https://www.cancer.gov ?Contact a health care provider if you: ?Notice that you bruise or bleed easily. ?Are losing weight without trying. ?Have new or increased fatigue or weakness. ?Get help right away if you have: ?Blood in your urine. ?A sudden increase in pain. ?A fever. ?Shortness of breath. ?Chest pain. ?Yellow skin or whites of your eyes (jaundice). ?Summary ?Kidney cancer is an abnormal growth of cells (tumor) in one or both kidneys. Tumors may spread to other parts of your body. ?In the early stages, kidney cancer does not cause symptoms. As the cancer grows, symptoms may include blood in the urine, pain in the upper back or abdomen, unexplained weight loss, fatigue, and fever. ?Treatment depends on the type and stage of the cancer. It may include surgery to remove the tumor, procedures and medicines to kill the cancer cells, or medicines to help your body fight cancer cells. ?This information is not intended to replace advice given to you by your health care provider. Make sure you discuss any questions you have with your health care provider. ?Document Revised: 05/15/2021 Document Reviewed: 05/15/2021 ?Elsevier Patient Education ? 2022 Elsevier Inc. ? ?

## 2021-10-31 NOTE — Progress Notes (Signed)
[place] testing requested on [phone] per Dr. Delton Coombes

## 2021-10-31 NOTE — Progress Notes (Signed)
10/31/2021 3:23 PM   Deanna Bush 1961-01-05 [id number]  Referring provider: Fayrene Helper, MD [address], [place],  [place] [address]  Followup right RCC   HPI: [name] is a 61yo here for followup for for metastatic RCC. She underwent right retroperitoneal biopsy which was consistent with right RCC. She is currently being followed by Dr. Delton Coombes and is scheduled for port placement and she will star Opdivo. She feels well. No issues with urination.    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    Cellulitis of leg, left    Complication of anesthesia    Hard to wake up   Diabetes mellitus without complication ([place])    Pt denies   Family history of breast cancer 08/16/2011   Family history of breast cancer    Family history of colon cancer    Family history of kidney cancer    Family history of prostate cancer    Hyperlipidemia    Hypertension    Hypothyroidism    MRSA (methicillin resistant staph aureus) culture positive 08/19/2011   Pre-diabetes     Surgical History: Past Surgical History:  Procedure Laterality Date   BREAST SURGERY Left    mastectomy   CESAREAN SECTION     x2   COLONOSCOPY N/A 03/03/2020   Procedure: COLONOSCOPY;  Surgeon: Daneil Dolin, MD;  [facility];  Service: Endoscopy;  Laterality: N/A;  12:00   left mastectomy     NEPHRECTOMY Right 02/06/2021   Procedure: NEPHRECTOMY- open radical;  Surgeon: Cleon Gustin, MD;  Location: [facility];  Service: Urology;  Laterality: Right;   POLYPECTOMY  03/03/2020   Procedure: POLYPECTOMY;  Surgeon: Daneil Dolin, MD;  Location: [facility];  Service: Endoscopy;;    Home Medications:  Allergies as of 10/31/2021       Reactions   Sulfonamide Derivatives Itching   Yellow Jacket Venom [bee Venom] Rash   urticaria        Medication List        Accurate as of October 31, 2021  3:23 PM. If you have any questions, ask  your nurse or doctor.          amLODipine 5 MG tablet Commonly known as: NORVASC TAKE 1 TABLET (5 MG TOTAL) BY MOUTH DAILY.   atorvastatin 10 MG tablet Commonly known as: LIPITOR TAKE ONE TABLET BY MOUTH ONCE DAILY What changed:  how much to take how to take this when to take this additional instructions   EPINEPHrine 0.3 mg/0.3 mL Soaj injection Commonly known as: EPI-PEN Inject 0.3 mg into the muscle as needed for anaphylaxis.   Bush (Ferrous Sulfate) 325 (65 Fe) MG Tabs Take 325 mg by mouth daily.   levothyroxine 88 MCG tablet Commonly known as: SYNTHROID TAKE 1 TABLET(88 MCG) BY MOUTH DAILY BEFORE AND BREAKFAST   lidocaine-prilocaine cream Commonly known as: EMLA Apply to affected area once   UNABLE TO FIND MASTECTOMY BRA AND PROTHESIS DX Z90.12   UNABLE TO FIND 6 mastectomy prosthesis and bras.        Allergies:  Allergies  Allergen Reactions   Sulfonamide Derivatives Itching   Yellow Jacket Venom [Bee Venom] Rash    urticaria    Family History: Family History  Problem Relation Age of Onset   Hypertension Mother    Diabetes Mother    Hyperlipidemia Mother    Colon cancer Maternal  Aunt        dx 29s   Prostate cancer Maternal Uncle    Throat cancer Maternal Uncle    Kidney cancer Maternal Grandfather    Lung cancer Half-Sister    Kidney cancer Half-Sister 63   Breast cancer Half-Sister 42   Breast cancer Half-Sister 75    Social History:  reports that she quit smoking about 19 years ago. Her smoking use included cigarettes. She has a 9.00 pack-year smoking history. She has never used smokeless tobacco. She reports that she does not drink alcohol and does not use drugs.  ROS: All other review of systems were reviewed and are negative except what is noted above in HPI  Physical Exam: BP 136/67    Pulse 68   Constitutional:  Alert and oriented, No acute distress. HEENT: [place] AT, moist mucus membranes.  Trachea midline, no  masses. Cardiovascular: No clubbing, cyanosis, or edema. Respiratory: Normal respiratory effort, no increased work of breathing. GI: Abdomen is soft, nontender, nondistended, no abdominal masses GU: No CVA tenderness.  Lymph: No cervical or inguinal lymphadenopathy. Skin: No rashes, bruises or suspicious lesions. Neurologic: Grossly intact, no focal deficits, moving all 4 extremities. Psychiatric: Normal mood and affect.  Laboratory Data: Lab Results  Component Value Date   WBC 6.0 10/22/2021   HGB 10.2 (L) 10/22/2021   HCT 32.2 (L) 10/22/2021   MCV 93.1 10/22/2021   PLT 317 10/22/2021    Lab Results  Component Value Date   CREATININE 0.91 09/27/2021    No results found for: PSA  No results found for: TESTOSTERONE  Lab Results  Component Value Date   HGBA1C 6.7 (H) 07/10/2021    Urinalysis    Component Value Date/Time   COLORURINE RED (A) 12/23/2020 1939   APPEARANCEUR Clear 09/21/2021 1007   LABSPEC 1.002 (L) 08/25/2020 2020   PHURINE 7.0 08/25/2020 2020   GLUCOSEU Negative 09/21/2021 1007   HGBUR (A) 12/23/2020 1939    TEST NOT REPORTED DUE TO COLOR INTERFERENCE OF URINE PIGMENT   BILIRUBINUR Negative 09/21/2021 1007   KETONESUR (A) 12/23/2020 1939    TEST NOT REPORTED DUE TO COLOR INTERFERENCE OF URINE PIGMENT   PROTEINUR Negative 09/21/2021 1007   PROTEINUR (A) 12/23/2020 1939    TEST NOT REPORTED DUE TO COLOR INTERFERENCE OF URINE PIGMENT   UROBILINOGEN 0.2 04/18/2021 0855   NITRITE Negative 09/21/2021 1007   NITRITE (A) 12/23/2020 1939    TEST NOT REPORTED DUE TO COLOR INTERFERENCE OF URINE PIGMENT   LEUKOCYTESUR Negative 09/21/2021 1007   LEUKOCYTESUR (A) 12/23/2020 1939    TEST NOT REPORTED DUE TO COLOR INTERFERENCE OF URINE PIGMENT    Lab Results  Component Value Date   LABMICR Comment 09/21/2021   WBCUA 0-5 02/21/2021   LABEPIT >10 (A) 02/21/2021   BACTERIA Few (A) 02/21/2021    Pertinent Imaging:  No results found for this or any  previous visit.  No results found for this or any previous visit.  No results found for this or any previous visit.  No results found for this or any previous visit.  No results found for this or any previous visit.  No results found for this or any previous visit.  No results found for this or any previous visit.  Results for orders placed during the hospital encounter of 12/23/20  CT RENAL STONE STUDY  Narrative CLINICAL DATA:  61 year old female with flank pain. Concern for kidney stone.  EXAM: CT ABDOMEN AND PELVIS WITHOUT CONTRAST  TECHNIQUE:  Multidetector CT imaging of the abdomen and pelvis was performed following the standard protocol without IV contrast.  COMPARISON:  None.  FINDINGS: Evaluation of this exam is limited in the absence of intravenous contrast.  Lower chest: Bibasilar linear atelectasis/scarring. There is a 3 mm right lower lobe nodule (8/4). The visualized lung bases are otherwise clear. There is hypoattenuation of the cardiac blood pool suggestive of anemia. Clinical correlation is recommended.  No intra-abdominal free air or free fluid.  Hepatobiliary: Apparent infiltration of the right renal mass into the posterior right lobe of the liver (segment VII). The liver is otherwise unremarkable. No intrahepatic biliary dilatation. No calcified gallstone or pericholecystic fluid.  Pancreas: Unremarkable. No pancreatic ductal dilatation or surrounding inflammatory changes.  Spleen: Normal in size without focal abnormality.  Adrenals/Urinary Tract: The adrenal glands unremarkable. There is a solid predominantly hypoattenuating right renal upper pole mass measuring approximately 6 x 9 cm in greatest axial dimensions and 11 cm in craniocaudal length most consistent with malignancy. Further characterization with renal mass protocol MRI on a nonemergent/outpatient basis recommended. There is superior extension of the mass with apparent invasion  into the right lobe of the liver. There is probable extension of the mass into the right renal pelvis with a degree of obstruction and mild right hydronephrosis. There is no hydronephrosis or nephrolithiasis on the left. The visualized ureters and urinary bladder appear unremarkable.  Stomach/Bowel: There is moderate stool throughout the colon. No bowel obstruction or active inflammation. The appendix is normal.  Vascular/Lymphatic: Mild aortoiliac atherosclerotic disease. The IVC is unremarkable. Evaluation for possible tumor thrombus is limited on this noncontrast CT. CT with IV contrast may provide better evaluation. No portal venous gas. No adenopathy.  Reproductive: The uterus is anteverted and grossly unremarkable. No adnexal masses.  Other: Left mastectomy with partially visualized prosthesis.  Musculoskeletal: Degenerative changes of the spine. No acute osseous pathology.  IMPRESSION: 1. Large right renal upper pole mass most consistent with malignancy. CT with IV contrast may provide better evaluation of potential tumor thrombus. 2. Mild right hydronephrosis secondary to extension of the mass into the right renal pelvis. 3. No bowel obstruction. Normal appendix. 4. A 3 mm right lower lobe nodule. 5. Aortic Atherosclerosis (ICD10-I70.0).   Electronically Signed By: Anner Crete M.D. On: 12/24/2020 00:49   Assessment & Plan:    1.. Renal cell carcinoma of right kidney ([place]) -Management per Dr. Delton Coombes. RTC 6 months   No follow-ups on file.  Nicolette Bang, MD  [facility] [place]

## 2021-10-31 NOTE — Progress Notes (Signed)

## 2021-11-01 ENCOUNTER — Ambulatory Visit: Payer: Self-pay | Admitting: Licensed Clinical Social Worker

## 2021-11-01 ENCOUNTER — Encounter: Payer: Self-pay | Admitting: Licensed Clinical Social Worker

## 2021-11-01 ENCOUNTER — Telehealth: Payer: Self-pay | Admitting: Licensed Clinical Social Worker

## 2021-11-01 DIAGNOSIS — Z1379 Encounter for other screening for genetic and chromosomal anomalies: Secondary | ICD-10-CM

## 2021-11-01 NOTE — Telephone Encounter (Signed)
Revealed negative genetic testing.  Revealed that a VUS in PMS2 and RECQL4 was identified.  We discussed that we do not know why she has had cancer or why there is cancer in the family. It could be due to a different gene that we are not testing, or something our current technology cannot pick up.  It will be important for her to keep in contact with genetics to learn if additional testing may be needed in the future.

## 2021-11-01 NOTE — Progress Notes (Signed)
HPI:  Ms. Deitrick was previously seen in the [place] clinic due to a personal and family history of cancer and concerns regarding a hereditary predisposition to cancer. Please refer to our prior cancer genetics clinic note for more information regarding our discussion, assessment and recommendations, at the time. Ms. Fromm recent genetic test results were disclosed to her, as were recommendations warranted by these results. These results and recommendations are discussed in more detail below.  CANCER HISTORY:  Oncology History  Renal cell carcinoma of right kidney ([place])  06/20/2021 Initial Diagnosis   Renal cell carcinoma of right kidney ([place])   10/31/2021 -  Chemotherapy   Patient is on Treatment Plan : RENAL CELL CARCINOMA Nivolumab + Ipilimumab q21d / Nivolumab q28d      Genetic Testing   Negative genetic testing. No pathogenic variants identified on the [facility] Multi-Cancer Panel + RNA. VUS in PMS2 called c.1732C>A and in RECQL4 called c.2967G>A  Identified. The report date is 11/01/2021.   The Multi-Cancer Panel + RNA offered by [facility] includes sequencing and/or deletion duplication testing of the following 84 genes: AIP, ALK, APC, ATM, AXIN2,BAP1,  BARD1, BLM, BMPR1A, BRCA1, BRCA2, BRIP1, CASR, CDC73, CDH1, CDK4, CDKN1B, CDKN1C, CDKN2A (p14ARF), CDKN2A (p16INK4a), CEBPA, CHEK2, CTNNA1, DICER1, DIS3L2, EGFR (c.2369C>T, p.Thr790Met variant only), EPCAM (Deletion/duplication testing only), FH, FLCN, GATA2, GPC3, GREM1 (Promoter region deletion/duplication testing only), HOXB13 (c.251G>A, p.Gly84Glu), HRAS, KIT, MAX, MEN1, MET, MITF (c.952G>A, p.Glu318Lys variant only), MLH1, MSH2, MSH3, MSH6, MUTYH, NBN, NF1, NF2, NTHL1, PALB2, PDGFRA, PHOX2B, PMS2, POLD1, POLE, POT1, PRKAR1A, PTCH1, PTEN, RAD50, RAD51C, RAD51D, RB1, RECQL4, RET, RUNX1, SDHAF2, SDHA (sequence changes only), SDHB, SDHC, SDHD, SMAD4, SMARCA4, SMARCB1, SMARCE1, STK11, SUFU, TERC, TERT, TMEM127, TP53, TSC1, TSC2, VHL,  WRN and WT1.      FAMILY HISTORY:  We obtained a detailed, 4-generation family history.  Significant diagnoses are listed below: Family History  Problem Relation Age of Onset   Hypertension Mother    Diabetes Mother    Hyperlipidemia Mother    Colon cancer Maternal Aunt        dx 14s   Prostate cancer Maternal Uncle    Throat cancer Maternal Uncle    Kidney cancer Maternal Grandfather    Lung cancer Half-Sister    Kidney cancer Half-Sister 52   Breast cancer Half-Sister 77   Breast cancer Half-Sister 36   Ms. Weinel has 2 sons (36 and 64) and 1 full sister (1). She has 4 maternal half sisters, 4 maternal half brothers, 5 paternal half brothers and 3 paternal half sisters. One of her maternal half sisters died of lung cancer. Another maternal half sister had kidney cancer at 64. A paternal half sister had breast cancer at 5 and another paternal half sister had breast at 34.    Ms. Rather mother is living at 62 with no history of cancer. Patient has 2 maternal aunts, 2 maternal uncles. One aunt had colon cancer in her 18s and is living at 53. An uncle had prostate cancer and her other uncle had throat cancer. Maternal grandfather had kidney cancer at 62 and died at 7. Maternal grandmother died at 61 of kidney failure.   Ms. Mckinzie father died at 67 with no history of cancer. Patient had 3 paternal aunts, 4 paternal uncles. None had cancer. No cancer in paternal cousins that she is aware of. Paternal grandparents passed in their 15s.   Ms. Miranda is unaware of previous family history of genetic testing for hereditary cancer risks.  There is no reported Ashkenazi Jewish ancestry. There is no known consanguinity.      GENETIC TEST RESULTS: Genetic testing reported out on 11/01/2021 through the [facility] Multi-Cancer+RNA cancer panel found no pathogenic mutations.   The Multi-Cancer Panel + RNA offered by [facility] includes sequencing and/or deletion duplication testing of the following 84  genes: AIP, ALK, APC, ATM, AXIN2,BAP1,  BARD1, BLM, BMPR1A, BRCA1, BRCA2, BRIP1, CASR, CDC73, CDH1, CDK4, CDKN1B, CDKN1C, CDKN2A (p14ARF), CDKN2A (p16INK4a), CEBPA, CHEK2, CTNNA1, DICER1, DIS3L2, EGFR (c.2369C>T, p.Thr790Met variant only), EPCAM (Deletion/duplication testing only), FH, FLCN, GATA2, GPC3, GREM1 (Promoter region deletion/duplication testing only), HOXB13 (c.251G>A, p.Gly84Glu), HRAS, KIT, MAX, MEN1, MET, MITF (c.952G>A, p.Glu318Lys variant only), MLH1, MSH2, MSH3, MSH6, MUTYH, NBN, NF1, NF2, NTHL1, PALB2, PDGFRA, PHOX2B, PMS2, POLD1, POLE, POT1, PRKAR1A, PTCH1, PTEN, RAD50, RAD51C, RAD51D, RB1, RECQL4, RET, RUNX1, SDHAF2, SDHA (sequence changes only), SDHB, SDHC, SDHD, SMAD4, SMARCA4, SMARCB1, SMARCE1, STK11, SUFU, TERC, TERT, TMEM127, TP53, TSC1, TSC2, VHL, WRN and WT1.  The test report has been scanned into EPIC and is located under the Molecular Pathology section of the Results Review tab.  A portion of the result report is included below for reference.    We discussed that because current genetic testing is not perfect, it is possible there may be a gene mutation in one of these genes that current testing cannot detect, but that chance is small.  There could be another gene that has not yet been discovered, or that we have not yet tested, that is responsible for the cancer diagnoses in the family. It is also possible there is a hereditary cause for the cancer in the family that Ms. Mauck did not inherit and therefore was not identified in her testing.  Therefore, it is important to remain in touch with cancer genetics in the future so that we can continue to offer Ms. Muzyka the most up to date genetic testing.   Genetic testing did identify 2 Variants of uncertain significance (VUS) - one in the PMS2 gene and a second in the RECQL4 gene.  At this time, it is unknown if these variants are associated with increased cancer risk or if they are normal findings, but most variants such as these get  reclassified to being inconsequential. They should not be used to make medical management decisions. With time, we suspect the lab will determine the significance of these variants, if any. If we do learn more about them, we will try to contact Ms. Hegna to discuss it further. However, it is important to stay in touch with [place] periodically and keep the address and phone number up to date.  ADDITIONAL GENETIC TESTING: We discussed with Ms. Seedorf that her genetic testing was fairly extensive.  If there are genes identified to increase cancer risk that can be analyzed in the future, we would be happy to discuss and coordinate this testing at that time.    CANCER SCREENING RECOMMENDATIONS: Ms. Waskey test result is considered negative (normal).  This means that we have not identified a hereditary cause for her  personal and family history of cancer at this time. Most cancers happen by chance and this negative test suggests that her cancer may fall into this category.    While reassuring, this does not definitively rule out a hereditary predisposition to cancer. It is still possible that there could be genetic mutations that are undetectable by current technology. There could be genetic mutations in genes that have not been tested or identified to increase cancer  risk.  Therefore, it is recommended she continue to follow the cancer management and screening guidelines provided by her oncology and primary healthcare provider.   An individual's cancer risk and medical management are not determined by genetic test results alone. Overall cancer risk assessment incorporates additional factors, including personal medical history, family history, and any available genetic information that may result in a personalized plan for cancer prevention and surveillance.  RECOMMENDATIONS FOR FAMILY MEMBERS:  Relatives in this family might be at some increased risk of developing cancer, over the general population risk, simply due  to the family history of cancer.  We recommended female relatives in this family have a yearly mammogram beginning at age 65, or 87 years younger than the earliest onset of cancer, an annual clinical breast exam, and perform monthly breast self-exams. Female relatives in this family should also have a gynecological exam as recommended by their primary provider.  All family members should be referred for colonoscopy starting at age 28.    It is also possible there is a hereditary cause for the cancer in Ms. Sherk's family that she did not inherit and therefore was not identified in her.  Based on Ms. Lanting's family history, we recommended her maternal half sister who had breast at 57 have genetic counseling and testing. Ms. Barkan will let us know if we can be of any assistance in coordinating genetic counseling and/or testing for these family members.  FOLLOW-UP: Lastly, we discussed with Ms. Samudio that cancer genetics is a rapidly advancing field and it is possible that new genetic tests will be appropriate for her and/or her family members in the future. We encouraged her to remain in contact with cancer genetics on an annual basis so we can update her personal and family histories and let her know of advances in cancer genetics that may benefit this family.   Our contact number was provided. Ms. Wisler questions were answered to her satisfaction, and she knows she is welcome to call us at anytime with additional questions or concerns.   Faith Rogue, MS, [facility] Genetic Counselor [email or web address] Phone: [phone]

## 2021-11-06 ENCOUNTER — Other Ambulatory Visit (HOSPITAL_COMMUNITY): Payer: Self-pay | Admitting: Physician Assistant

## 2021-11-06 NOTE — Progress Notes (Signed)
Pharmacist Chemotherapy Monitoring - Initial Assessment    Anticipated start date: 11/08/2021   The following has been reviewed per standard work regarding the patient's treatment regimen: [name] diagnosis, treatment plan and drug doses, and organ/hematologic function Lab orders and baseline tests specific to treatment regimen  The treatment plan start date, drug sequencing, and pre-medications Prior authorization status  Patient's documented medication list, including drug-drug interaction screen and prescriptions for anti-emetics and supportive care specific to the treatment regimen The drug concentrations, fluid compatibility, administration routes, and timing of the medications to be used [name] access for treatment and lifetime cumulative dose history, if applicable  [name] medication allergies and previous infusion related reactions, if applicable   Changes made to treatment plan:  treatment plan date  Follow up needed:  N/A   Wynona Neat, [facility], 11/06/2021  4:12 PM

## 2021-11-07 ENCOUNTER — Ambulatory Visit (HOSPITAL_COMMUNITY)
Admission: RE | Admit: 2021-11-07 | Discharge: 2021-11-07 | Disposition: A | Discharge status: Home / Self Care | Payer: No Typology Code available for payment source | Source: Ambulatory Visit | Attending: Hematology | Admitting: Hematology

## 2021-11-07 ENCOUNTER — Encounter (HOSPITAL_COMMUNITY): Payer: Self-pay

## 2021-11-07 ENCOUNTER — Other Ambulatory Visit: Payer: Self-pay

## 2021-11-07 DIAGNOSIS — E785 Hyperlipidemia, unspecified: Secondary | ICD-10-CM | POA: Insufficient documentation

## 2021-11-07 DIAGNOSIS — C641 Malignant neoplasm of right kidney, except renal pelvis: Secondary | ICD-10-CM | POA: Insufficient documentation

## 2021-11-07 DIAGNOSIS — E119 Type 2 diabetes mellitus without complications: Secondary | ICD-10-CM | POA: Insufficient documentation

## 2021-11-07 DIAGNOSIS — I10 Essential (primary) hypertension: Secondary | ICD-10-CM | POA: Insufficient documentation

## 2021-11-07 DIAGNOSIS — E039 Hypothyroidism, unspecified: Secondary | ICD-10-CM | POA: Insufficient documentation

## 2021-11-07 HISTORY — PX: IR IMAGING GUIDED PORT INSERTION: IMG5740

## 2021-11-07 MED ORDER — LIDOCAINE-EPINEPHRINE 1 %-1:100000 IJ SOLN
INTRAMUSCULAR | Status: AC
  Filled 2021-11-07: qty 1

## 2021-11-07 MED ORDER — SODIUM CHLORIDE 0.9 % IV SOLN
INTRAVENOUS | Status: AC | PRN
  Administered 2021-11-07: 10 mL/h via INTRAVENOUS

## 2021-11-07 MED ORDER — MIDAZOLAM HCL 2 MG/2ML IJ SOLN
INTRAMUSCULAR | Status: AC | PRN
  Administered 2021-11-07 (×2): 1 mg via INTRAVENOUS

## 2021-11-07 MED ORDER — FENTANYL CITRATE (PF) 100 MCG/2ML IJ SOLN
INTRAMUSCULAR | Status: AC
  Filled 2021-11-07: qty 2

## 2021-11-07 MED ORDER — HEPARIN SOD (PORK) LOCK FLUSH 100 UNIT/ML IV SOLN
INTRAVENOUS | Status: AC | PRN
  Administered 2021-11-07: 500 [IU] via INTRAVENOUS

## 2021-11-07 MED ORDER — SODIUM CHLORIDE 0.9 % IV SOLN: INTRAVENOUS | Status: DC

## 2021-11-07 MED ORDER — MIDAZOLAM HCL 2 MG/2ML IJ SOLN
INTRAMUSCULAR | Status: AC
  Filled 2021-11-07: qty 2

## 2021-11-07 MED ORDER — HEPARIN SOD (PORK) LOCK FLUSH 100 UNIT/ML IV SOLN
INTRAVENOUS | Status: AC
  Filled 2021-11-07: qty 5

## 2021-11-07 MED ORDER — FENTANYL CITRATE (PF) 100 MCG/2ML IJ SOLN
INTRAMUSCULAR | Status: AC | PRN
  Administered 2021-11-07 (×2): 50 ug via INTRAVENOUS

## 2021-11-07 NOTE — H&P (Signed)
Chief Complaint: Patient was seen in consultation today for port-a catheter placement  at the request of Katragadda,Sreedhar  Referring Physician(s): Katragadda,Sreedhar  Supervising Physician: Ruthann Cancer  Patient Status: [facility] - Out-pt  History of Present Illness: Deanna Bush is a 61 y.o. female w/ PMH significant for adenocarcinoma of left breast, DM2, HLD, HTN, and hypothyroidism. 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 Pt has been referred to IR by Dr. Delton Coombes for port-a-catheter placement for chemotherapy.   Past Medical History:  Diagnosis Date   Adenocarcinoma of breast ([place])    left    Cellulitis of leg, left    Complication of anesthesia    Hard to wake up   Diabetes mellitus without complication ([place])    Pt denies   Family history of breast cancer 08/16/2011   Family history of breast cancer    Family history of colon cancer    Family history of kidney cancer    Family history of prostate cancer    Hyperlipidemia    Hypertension    Hypothyroidism    MRSA (methicillin resistant staph aureus) culture positive 08/19/2011   Pre-diabetes     Past Surgical History:  Procedure Laterality Date   BREAST SURGERY Left    mastectomy   CESAREAN SECTION     x2   COLONOSCOPY N/A 03/03/2020   Procedure: COLONOSCOPY;  Surgeon: Daneil Dolin, MD;  Location: [facility] SUITE;  Service: Endoscopy;  Laterality: N/A;  12:00   left mastectomy     NEPHRECTOMY Right 02/06/2021   Procedure: NEPHRECTOMY- open radical;  Surgeon: Cleon Gustin, MD;  Location: [facility] ORS;  Service: Urology;  Laterality: Right;   POLYPECTOMY  03/03/2020   Procedure: POLYPECTOMY;  Surgeon: Daneil Dolin, MD;  Location: [facility];  Service: Endoscopy;;    Allergies: Sulfonamide derivatives and Yellow jacket venom [bee venom]  Medications: Prior to Admission medications   Medication Sig Start Date End Date Taking? Authorizing Provider  acetaminophen  (TYLENOL) 500 MG tablet Take 500 mg by mouth every 6 (six) hours as needed for moderate pain or headache.   Yes [provider]  amLODipine (NORVASC) 5 MG tablet TAKE 1 TABLET (5 MG TOTAL) BY MOUTH DAILY. 07/31/21  Yes Lindell Spar, MD  atorvastatin (LIPITOR) 10 MG tablet TAKE ONE TABLET BY MOUTH ONCE DAILY 01/16/21  Yes Fayrene Helper, MD  Iron, Ferrous Sulfate, 325 (65 Fe) MG TABS Take 325 mg by mouth daily. 02/22/21  Yes Lindell Spar, MD  levothyroxine (SYNTHROID) 88 MCG tablet TAKE 1 TABLET(88 MCG) BY MOUTH DAILY BEFORE AND BREAKFAST 09/12/21  Yes Nida, Marella Chimes, MD  Multiple Vitamin (MULTIVITAMIN WITH MINERALS) TABS tablet Take 1 tablet by mouth daily.   Yes [provider]  EPINEPHrine 0.3 mg/0.3 mL IJ SOAJ injection Inject 0.3 mg into the muscle as needed for anaphylaxis. 02/22/21   Lindell Spar, MD  lidocaine-prilocaine (EMLA) cream Apply to affected area once 10/30/21   Derek Jack, MD  UNABLE TO FIND MASTECTOMY [place] AND PROTHESIS DX Z90.12 10/16/17   Fayrene Helper, MD  UNABLE TO FIND 6 mastectomy prosthesis and bras. 06/28/20   Fayrene Helper, MD     Family History  Problem Relation Age of Onset   Hypertension Mother    Diabetes Mother    Hyperlipidemia Mother    Colon cancer Maternal Aunt        dx 1s   Prostate cancer Maternal Uncle  Throat cancer Maternal Uncle    Kidney cancer Maternal Grandfather    Lung cancer Half-Sister    Kidney cancer Half-Sister 14   Breast cancer Half-Sister 57   Breast cancer Half-Sister 61    Social History   Socioeconomic History   Marital status: Married    Spouse name: Not on file   Number of children: 2   Years of education: Not on file   Highest education level: Not on file  Occupational History   Occupation: CNA  Tobacco Use   Smoking status: Former    Packs/day: 0.50    Years: 18.00    Pack years: 9.00    Types: Cigarettes    Quit date: 07/01/2002    Years since quitting:  19.3   Smokeless tobacco: Never  Vaping Use   Vaping Use: Never used  Substance and Sexual Activity   Alcohol use: No   Drug use: No   Sexual activity: Not Currently  Other Topics Concern   Not on file  Social History Narrative   Not on file   Social Determinants of Health   Financial Resource Strain: Not on file  Food Insecurity: Not on file  Transportation Needs: Not on file  Physical Activity: Not on file  Stress: Not on file  Social Connections: Not on file     Review of Systems: A 12 point ROS discussed and pertinent positives are indicated in the HPI above.  All other systems are negative.  Review of Systems  Constitutional:  Negative for appetite change, chills and fever.  HENT:  Negative for nosebleeds.   Eyes:  Negative for visual disturbance.  Respiratory:  Negative for cough and shortness of breath.   Cardiovascular:  Negative for chest pain and leg swelling.  Gastrointestinal:  Negative for abdominal pain, blood in stool, nausea and vomiting.  Genitourinary:  Negative for hematuria.  Neurological:  Negative for dizziness, light-headedness and headaches.   Vital Signs: BP 131/65 (BP Location: Right Arm)    Pulse 65    Temp 98.6 F (37 C) (Oral)    Resp 20    Ht 5\' 6"  (1.676 m)    Wt 195 lb (88.5 kg)    SpO2 100%    BMI 31.47 kg/m   Physical Exam Constitutional:      Appearance: Normal appearance.  HENT:     Head: Normocephalic and atraumatic.     Mouth/Throat:     Mouth: Mucous membranes are moist.     Pharynx: Oropharynx is clear.  Cardiovascular:     Rate and Rhythm: Normal rate and regular rhythm.     Pulses: Normal pulses.     Heart sounds: Normal heart sounds. No murmur heard.   No friction rub. No gallop.  Pulmonary:     Effort: Pulmonary effort is normal. No respiratory distress.     Breath sounds: Normal breath sounds. No stridor. No wheezing, rhonchi or rales.  Abdominal:     General: Bowel sounds are normal. There is no distension.      Palpations: Abdomen is soft.     Tenderness: There is no abdominal tenderness. There is no guarding.  Musculoskeletal:     Right lower leg: No edema.     Left lower leg: No edema.  Skin:    General: Skin is warm and dry.  Neurological:     Mental Status: She is alert and oriented to person, place, and time.  Psychiatric:        Mood and Affect:  Mood normal.        Behavior: Behavior normal.        Thought Content: Thought content normal.        Judgment: Judgment normal.    Imaging: CT GUIDED NEEDLE PLACEMENT  Result Date: 10/22/2021 INDICATION: Renal cell carcinoma, right nephrectomy site masslike abnormalities by CT consistent with local recurrence. EXAM: CT CORE BIOPSIES RIGHT NEPHRECTOMY BED RECURRENT MASS MEDICATIONS: 1% LIDOCAINE LOCAL ANESTHESIA/SEDATION: Moderate (conscious) sedation was employed during this procedure. A total of Versed 2.0 mg and Fentanyl 75 mcg was administered intravenously by the radiology nurse. Total intra-service moderate Sedation Time: 16 minutes. [name] level of consciousness and vital signs were monitored continuously by radiology nursing throughout the procedure under my direct supervision. FLUOROSCOPY TIME:  Fluoroscopy Time: NONE. COMPLICATIONS: None immediate. PROCEDURE: Informed written consent was obtained from the patient after a thorough discussion of the procedural risks, benefits and alternatives. All questions were addressed. Maximal Sterile Barrier Technique was utilized including caps, mask, sterile gowns, sterile gloves, sterile drape, hand hygiene and skin antiseptic. A timeout was performed prior to the initiation of the procedure. Previous imaging reviewed. Preliminary ultrasound performed. Patient positioned prone. Noncontrast localization CT performed. The nodular mass in the right nephrectomy site was localized and marked for a posterior approach. Under sterile conditions and local anesthesia, a 17 gauge 11.8 cm access needle was  advanced from a posterior approach to the right nephrectomy bed lesion. Needle position confirmed with CT. 18 gauge core biopsies obtained. Samples were intact and non fragmented. These were placed in formalin. Needle tract occluded with Gel-Foam. Postprocedure imaging demonstrates no hemorrhage or hematoma. Patient tolerated the biopsy well. IMPRESSION: Successful CT-guided right nephrectomy bed mass 18 gauge core biopsy Electronically Signed   By: Jerilynn Mages.  Shick M.D.   On: 10/22/2021 15:12    Labs:  CBC: Recent Labs    02/21/21 1355 07/10/21 1530 09/27/21 0922 10/22/21 0800  WBC 6.8 5.8 5.4 6.0  HGB 8.9* 11.1 10.9* 10.2*  HCT 28.1* 33.7* 34.6* 32.2*  PLT 409 374 320 317    COAGS: Recent Labs    02/02/21 1331 10/22/21 0800  INR 1.0 0.9  APTT 34  --     BMP: Recent Labs    02/11/21 0316 02/12/21 0314 02/13/21 0342 02/21/21 1355 05/28/21 1116 05/30/21 1111 07/10/21 1530 09/18/21 1538 09/27/21 0922  NA 138 136 136 138  --  141 138  --  136  K 3.8 3.7 4.1 4.5  --  4.7 4.6  --  4.1  CL 106 102 103 102  --  104 101  --  104  CO2 26 27 28 21   --  24 22  --  25  GLUCOSE 118* 106* 101* 97  --  101* 82  --  121*  BUN 12 11 16 16   --  19 18  --  15  CALCIUM 8.8* 8.9 9.3 9.6  --  9.5 9.8  --  9.4  CREATININE 1.16* 1.07* 1.18* 1.06*   < > 1.04* 1.05* 1.20* 0.91  GFRNONAA 54* 59* 53*  --   --   --   --   --  >60   < > = values in this interval not displayed.    LIVER FUNCTION TESTS: Recent Labs    02/21/21 1355 05/30/21 1111 07/10/21 1530 09/27/21 0922  BILITOT <0.2 <0.2 0.3 0.3  AST 19 12 16 15   ALT 24 12 12 11   ALKPHOS 103 98 98 96  PROT 7.4  7.4 7.4 7.9  ALBUMIN 4.0 4.6 4.4 3.9    TUMOR MARKERS: No results for input(s): AFPTM, CEA, CA199, CHROMGRNA in the last 8760 hours.  Assessment and Plan: History of adenocarcinoma of left breast, DM2, HLD, HTN, and hypothyroidism. 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 Pt has been referred to IR  by Dr. Delton Coombes for port-a-catheter placement for chemotherapy.   Pt resting quietly in bed. She is A&O, calm and pleasant.  She is in no distress.  Pt states she is NPO per order.  Pt denies the use of thinning medications No labs needed today.   Risks and benefits of image guided port-a-catheter placement was discussed with the patient including, but not limited to bleeding, infection, pneumothorax, or fibrin sheath development and need for additional procedures.  All of the patient's questions were answered, patient is agreeable to proceed. Consent signed and in chart.   Thank you for this interesting consult.  I greatly enjoyed meeting MIKAYLAH LIBBEY and look forward to participating in their care.  A copy of this report was sent to the requesting provider on this date.  Electronically Signed: Tyson Alias, NP 11/07/2021, 9:53 AM   I spent a total of 20 minutes in face to face in clinical consultation, greater than 50% of which was counseling/coordinating care for port-a-catheter placement.

## 2021-11-07 NOTE — Procedures (Signed)
Interventional Radiology Procedure Note ° °Procedure: Single Lumen Power Port Placement   ° °Access:  Right internal jugular vein ° °Findings: Catheter tip positioned at cavoatrial junction. Port is ready for immediate use.  ° °Complications: None ° °EBL: < 10 mL ° °Recommendations:  °- Ok to shower in 24 hours °- Do not submerge for 7 days °- Routine line care  ° ° °Yobani Schertzer, MD ° ° ° °

## 2021-11-08 ENCOUNTER — Ambulatory Visit (HOSPITAL_COMMUNITY): Payer: No Typology Code available for payment source

## 2021-11-08 ENCOUNTER — Other Ambulatory Visit (HOSPITAL_COMMUNITY): Payer: No Typology Code available for payment source

## 2021-11-08 ENCOUNTER — Inpatient Hospital Stay (HOSPITAL_COMMUNITY): Payer: No Typology Code available for payment source | Attending: Hematology

## 2021-11-08 DIAGNOSIS — D649 Anemia, unspecified: Secondary | ICD-10-CM | POA: Insufficient documentation

## 2021-11-08 DIAGNOSIS — Z8051 Family history of malignant neoplasm of kidney: Secondary | ICD-10-CM | POA: Insufficient documentation

## 2021-11-08 DIAGNOSIS — Z8042 Family history of malignant neoplasm of prostate: Secondary | ICD-10-CM | POA: Insufficient documentation

## 2021-11-08 DIAGNOSIS — E119 Type 2 diabetes mellitus without complications: Secondary | ICD-10-CM | POA: Insufficient documentation

## 2021-11-08 DIAGNOSIS — Z803 Family history of malignant neoplasm of breast: Secondary | ICD-10-CM | POA: Insufficient documentation

## 2021-11-08 DIAGNOSIS — Z79899 Other long term (current) drug therapy: Secondary | ICD-10-CM | POA: Insufficient documentation

## 2021-11-08 DIAGNOSIS — Z800 Family history of malignant neoplasm of digestive organs: Secondary | ICD-10-CM | POA: Insufficient documentation

## 2021-11-08 DIAGNOSIS — Z801 Family history of malignant neoplasm of trachea, bronchus and lung: Secondary | ICD-10-CM | POA: Insufficient documentation

## 2021-11-08 DIAGNOSIS — Z5112 Encounter for antineoplastic immunotherapy: Secondary | ICD-10-CM | POA: Insufficient documentation

## 2021-11-08 DIAGNOSIS — Z905 Acquired absence of kidney: Secondary | ICD-10-CM | POA: Insufficient documentation

## 2021-11-08 DIAGNOSIS — I10 Essential (primary) hypertension: Secondary | ICD-10-CM | POA: Insufficient documentation

## 2021-11-08 DIAGNOSIS — Z87891 Personal history of nicotine dependence: Secondary | ICD-10-CM | POA: Insufficient documentation

## 2021-11-08 DIAGNOSIS — Z808 Family history of malignant neoplasm of other organs or systems: Secondary | ICD-10-CM | POA: Insufficient documentation

## 2021-11-08 DIAGNOSIS — C641 Malignant neoplasm of right kidney, except renal pelvis: Secondary | ICD-10-CM | POA: Insufficient documentation

## 2021-11-12 ENCOUNTER — Encounter (HOSPITAL_COMMUNITY): Payer: Self-pay | Admitting: Hematology

## 2021-11-14 NOTE — Progress Notes (Signed)
Pharmacist Chemotherapy Monitoring - Initial Assessment    Anticipated start date: 11/19/21   The following has been reviewed per standard work regarding the patient's treatment regimen: [name] diagnosis, treatment plan and drug doses, and organ/hematologic function Lab orders and baseline tests specific to treatment regimen  The treatment plan start date, drug sequencing, and pre-medications Prior authorization status  Patient's documented medication list, including drug-drug interaction screen and prescriptions for anti-emetics and supportive care specific to the treatment regimen The drug concentrations, fluid compatibility, administration routes, and timing of the medications to be used [name] access for treatment and lifetime cumulative dose history, if applicable  [name] medication allergies and previous infusion related reactions, if applicable   Changes made to treatment plan:  treatment plan date  Follow up needed:  N/A   Judge Stall, [place], 11/14/2021  3:11 PM

## 2021-11-15 NOTE — Patient Instructions (Addendum)
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  You will be treated in the clinic every 3 weeks with a combination of immunotherapy drugs.  Those drugs are Opdivo and Yervoy.  You will receive both drugs every 2 weeks for 4 cycles.  Then you will return to the clinic every 4 weeks to receive Opdivo only.  The intent of treatment is to help control your cancer, keep it from spreading further, and to alleviate any symptoms you may be having related to your disease. You will see the doctor regularly throughout treatment.  We will obtain blood work from you prior to every treatment and monitor your results to make sure it is safe to give your treatment. The doctor monitors your response to treatment by the way you are feeling, your blood work, and by obtaining scans periodically.  There will be wait times while you are here for treatment.  It will take about 30 minutes to 1 hour for your lab work to result.  Then there will be wait times while pharmacy mixes your medications.    Nivolumab (Opdivo)   About This Drug   Nivolumab is used to treat cancer. It is given in the vein (IV).   It helps your immune system work properly to target and kill cancer cells. It will take 30 minutes to infuse.     These are some of the most common or important side effects:    Immune Reactions    This medication stimulates your immune system. Your immune system can attack normal organs and tissues in your body, leading to serious or life threatening complications. It is important to notify your healthcare provider right away if you develop any of the following symptoms:    Diarrhea / Intestinal problems (colitis, inflammation of the bowel): Abdominal pain, diarrhea, cramping, mucus or blood in the stool, dark or tar-like stools, fever. Diarrhea means different things to different people. Any increase in your normal bowel patterns can be defined as  diarrhea and should be reported to your healthcare team.   Skin reactions: Report rash, with or without itching (pruritis), sores in your mouth, blistering or peeling skin, as these can become severe and require treatment with corticosteroids.   Lung problems (pneumonitis, inflammation of the lung): New or worsening cough, shortness of breath, trouble breathing, or chest pain.   Liver problems (hepatitis, inflammation of the liver): Yellowing of the skin or eyes, your urine appears dark or brown, pain in your abdomen, bleeding or bruising more easily than normal, or severe nausea and vomiting.    Brain and/or nerve problems: Report any headache, drooping of eyelids, double vision, trouble swallowing, weakness of arms, legs or face, or numbness or tingling in the hands or feet to your healthcare team.   Hormone abnormalities: Immune reactions can affect the pituitary, thyroid, pancreas and adrenal glands, resulting in inflammation of these glands, which can affect their production of certain hormones. Some hormone levels can be monitored with blood work. It is important that you report any changes in how you are feeling to your care team. Symptoms of these hormonal changes can include: headaches, nausea, vomiting, constipation, rapid heart rate, increased sweating, extreme fatigue, weakness, changes in your voice, changes in memory and concentration, increased hunger or thirst, increased urination, weight gain, hair loss, dizziness, feeling cold all the time, and changes in mood or behavior (including irritability, forgetfulness and decreased sex drive).  Eye problems: Report any changes in vision, blurry or double vision, and eye pain or redness to your healthcare team.  Kidney problems (kidney inflammation or failure): Decreased urine output, blood in the urine, swelling in the ankles, loss of appetite.   Fatigue:  Fatigue is very common during cancer treatment and is an overwhelming feeling of  exhaustion that is not usually relieved by rest. While on cancer treatment, and for a period after, you may need to adjust your schedule to manage fatigue. Plan times to rest during the day and conserve energy for more important activities. Exercise can help combat fatigue; a simple daily walk with a friend can help.  Talk to your healthcare team for helpful tips on dealing with this side effect.   Allergic Reactions:  In some cases, patients can have an allergic reaction to this medication. Signs of a reaction can include: shortness of breath or difficulty breathing, chest pain, rash, flushing or itching or a decrease in blood pressure. If you notice any changes in how you feel during the infusion, let your nurse know immediately. The infusion will be slowed or stopped if this occurs.    Less common, but important side effects can include:   Allogenic Stem Cell Transplant Reactions: Patients who receive this medication before or after having an allogenic stem cell transplant can be at an increased risk of graft vs. host disease, veno-occlusive disease and fever syndrome. You providers will monitor you closely for these side effects.    Reproductive Concerns  Exposure of an unborn child to this medication could cause birth defects, so you should not become pregnant or father a child while on this medication. Even if your menstrual cycle stops or you believe you are not producing sperm, effective birth control is necessary during treatment and for 5 months after stopping treatment. You should not breastfeed while taking this medication or for five months after the end of treatment.    Important Information    This drug may be present in the saliva, tears, sweat, urine, stool, vomit, semen, and vaginal secretions. Talk to your doctor and/or your nurse about the necessary precautions to take during this time.   Treating Side Effects    To decrease infection, wash your hands regularly    Avoid close  contact with people who have a cold, the flu, or other infections.    Take your temperature as your doctor or nurse tells you, and whenever you feel like you may have a fever    To help decrease bleeding, use a soft toothbrush. Check with your nurse before using dental floss.    Be very careful when using knives or tools    Use an electric shaver instead of a razor    Ask your doctor or nurse about medicines that are available to help stop or lessen constipation, diarrhea and/or nausea.    Drink plenty of fluids (a minimum of eight glasses per day is recommended).    If you are not able to move your bowels, check with your doctor or nurse before you use any enemas, laxatives, or suppositories    To help with nausea and vomiting, eat small, frequent meals instead of three large meals a day. Choose foods and drinks that are at room temperature. Ask your nurse or doctor about other helpful tips and medicine that is available to help or stop lessen these symptoms.    If you get diarrhea, eat low-fiber foods that are high in protein and  calories and avoid foods that can irritate your digestive tracts or lead to cramping. Ask your nurse or doctor about medicine that can lessen or stop your diarrhea.    To help with decreased appetite, eat small, frequent meals    Eat high caloric food such as pudding, ice cream, yogurt and milkshakes.    Manage tiredness by pacing your activities for the day. Be sure to include periods of rest between energy-draining activities    Keeping your pain under control is important to your wellbeing. Please tell your doctor or nurse if you are experiencing pain.    If you have diabetes, keep good control of your blood sugar level. Tell your nurse or your doctor if your glucose levels are higher or lower than normal    If you get a rash do not put anything on it unless your doctor or nurse says you may. Keep the area around the rash clean and dry. Ask your doctor for  medicine if your rash bothers you.    Infusion reactions may occur after your infusion. If this happens, call 911 for emergency care.   Food and Drug Interactions    There are no known interactions of nivolumab with food or other medications.    Tell your doctor and pharmacist about all the medicines and dietary supplements (vitamins, minerals, herbs and others) that you are taking at this time. The safety and use of dietary supplements and alternative diets are often not known. Using these might affect your cancer or interfere with your treatment. Until more is known, you should not use dietary supplements or alternative diets without your cancer doctor's help.   When to Call the Doctor   Call your doctor or nurse if you have any of these symptoms and/or any new or unusual symptoms:    Fever of 100.4 F (38 C) or higher    Chills    Easy bleeding or bruising    Wheezing or trouble breathing    Dry cough, or cough with yellow, green or bloody mucus    Confusion and/or agitation    Hallucinations    Trouble understanding or speaking    Blurry vision or changes in your eyesight    Numbness or lack of strength to your arms, legs, face, or body    Feeling dizzy or lightheaded    Loose bowel movements (diarrhea) more than 4 times a day or diarrhea with weakness or lightheadedness    Nausea that stops you from eating or drinking, and/or that is not relieved by prescribed medicines    Lasting loss of appetite or rapid weight loss of five pounds in a week    No bowel movement for 3 days or you feel uncomfortable    Bad abdominal pain, especially in upper right area    Fatigue or extreme weakness that interferes with normal activities    Decreased urine    Unusual thirst or passing urine often    Rash that is not relieved by prescribed medicines    Rash or itching    Flu-like symptoms: fever, headache, muscle and joint aches, and fatigue (low energy, feeling weak)     Signs of liver problems: dark urine, pale bowel movements, bad stomach pain, feeling very tired and weak, unusual itching, or yellowing of the eyes or skin.    Signs of infusion reaction: fever or shaking chills, flushing, facial swelling, feeling dizzy, headache, trouble breathing, rash, itching, chest tightness, or chest pain.    If  you think you may be pregnant   Reproduction Warnings    Pregnancy warning: This drug can have harmful effects on the unborn baby. Women of child bearing potential should use effective methods of birth control during your cancer treatment and for at least 5 months after treatment. Let your doctor know right away if you think you may be pregnant.    Breastfeeding warning: It is not known if this drug passes into breast milk. For this reason, women should not breast feed during treatment because this drug could enter the breast milk and cause harm to a breast feeding baby.    Fertility warning: Human fertility studies have not been done with this drug. Talk with your doctor or nurse if you plan to have children. Ask for information on sperm or egg banking.     Altagracia.Kinds        Generic name : Ipilimumab    How Yervoy  Is Given:  Curt Bears is given as an intravenous injection through a vein (IV). It helps your immune system work properly to target and kill cancer cells.  It will take 30 minutes to infuse.    Immune Reactions  This medication stimulates your immune system. Your immune system can attack normal organs and tissues in your body, leading to serious or life threatening complications. It is important to notify your healthcare provider right away if you develop any of the following symptoms:    Diarrhea / Intestinal problems (colitis, inflammation of the bowel): Abdominal pain, diarrhea, cramping, mucus or blood in the stool, dark or tar-like stools, fever. Diarrhea means different things to different people. Any increase in your normal bowel patterns can be  defined as diarrhea and should be reported to your healthcare team.   Bowel obstruction or perforation: Abdominal pain, fever, constipation, bloating and cramping. Any decrease in your normal bowel patterns should be reported.    Skin reactions: Report rash, with or without itching (pruritis), sores in your mouth, blistering or peeling skin, as these can become severe and require treatment with corticosteroids.   Liver problems (hepatitis, inflammation of the liver): Yellowing of the skin or eyes, your urine appears dark or brown, pain in your abdomen, bleeding or bruising more easily than normal, or severe nausea and vomiting.   Brain and/or nerve problems: Report any headache, drooping of eyelids, double vision, trouble swallowing, weakness of arms, legs or face, or numbness or tingling in the hands or feet to your healthcare team.   Eye problems: Report any changes in vision, blurry or double vision, and eye pain or redness.    Lung problems (pneumonitis, inflammation of the lung): New or worsening cough, shortness of breath, trouble breathing, or chest pain.   Kidney problems: (kidney inflammation or failure):  Decreased urine output, blood in the urine, swelling in the ankles, loss of appetite.    Hormone abnormalities: Immune reactions can affect the pituitary, thyroid, pancreas and adrenal glands, resulting in inflammation of these glands, which can affect their production of certain hormones. Some hormone levels can be monitored with blood work. It is important that you report any changes in how you are feeling to your care team. Symptoms of these hormonal changes can include: headaches, nausea, vomiting, constipation, rapid heart rate, increased sweating, extreme fatigue, weakness, changes in your voice, changes in memory and concentration, increased hunger or thirst, increased urination, weight gain, hair loss, dizziness, feeling cold all the time, and changes in mood or behavior (including  irritability, forgetfulness and decreased  sex drive).    Infusion-Related Side Effects: The infusion can cause a reaction that may lead to chills, fever, low blood pressure, dizziness, feeling like you are going to pass out and difficulty breathing. Let your nurse know right away if you are experiencing any changes in how you are feeling.    Fatigue  Fatigue is very common during cancer treatment and is an overwhelming feeling of exhaustion that is not usually relieved by rest. While on cancer treatment, and for a period after, you may need to adjust your schedule to manage fatigue. Plan times to rest during the day and conserve energy for more important activities. Exercise can help combat fatigue; a simple daily walk with a friend can help. Talk to your healthcare team for helpful tips on dealing with this side effect.    Reproductive Concerns  Exposure of an unborn child to this medication could cause birth defects, so you should not become pregnant or father a child while on this medication. Effective birth control is necessary during treatment and for 3 months after the last dose. Even if your menstrual cycle stops or you believe you are not producing sperm, you could still be fertile and conceive. You should not breastfeed while receiving this medication and for 3 months after the last dose.      When to Call the Doctor   Call your doctor or nurse if you have any of these symptoms and/or any new or unusual symptoms:    Fever of 100.4 F (38 C) or higher    Chills    Easy bleeding or bruising    Wheezing or trouble breathing    Dry cough, or cough with yellow, green or bloody mucus    Confusion and/or agitation    Hallucinations    Trouble understanding or speaking    Blurry vision or changes in your eyesight    Numbness or lack of strength to your arms, legs, face, or body    Feeling dizzy or lightheaded    Loose bowel movements (diarrhea) more than 4 times a day or diarrhea  with weakness or lightheadedness    Nausea that stops you from eating or drinking, and/or that is not relieved by prescribed medicines    Lasting loss of appetite or rapid weight loss of five pounds in a week    No bowel movement for 3 days or you feel uncomfortable    Bad abdominal pain, especially in upper right area    Fatigue or extreme weakness that interferes with normal activities    Decreased urine    Unusual thirst or passing urine often    Rash that is not relieved by prescribed medicines    Rash or itching    Flu-like symptoms: fever, headache, muscle and joint aches, and fatigue (low energy, feeling weak)    Signs of liver problems: dark urine, pale bowel movements, bad stomach pain, feeling very tired and weak, unusual itching, or yellowing of the eyes or skin.    Signs of infusion reaction: fever or shaking chills, flushing, facial swelling, feeling dizzy, headache, trouble breathing, rash, itching, chest tightness, or chest pain.   Precautions:  Before starting Yervoy treatment, make sure you tell your doctor about any other medications you are taking (including prescription, over-the-counter, vitamins, herbal remedies, etc.).  Do not receive any kind of immunization or vaccination without your doctor's approval while taking Yervoy. Inform your health care professional if you are pregnant or may be pregnant prior to starting  this treatment. Pregnancy category C Curt Bears may be hazardous to the fetus. Women who are pregnant or become pregnant must be advised of the potential hazard to the fetus.) For both men and women: Do not conceive a child (get pregnant) while taking Yervoy. Barrier methods of contraception, such as condoms, are recommended. Discuss with your doctor when you may safely become pregnant or conceive a child after therapy. Do not breast feed while taking this medication.      SELF CARE ACTIVITIES WHILE ON IMMUNOTHERAPY:   Hydration Increase your fluid  intake 48 hours prior to treatment and drink at least 8 to 12 cups (64 ounces) of water/decaffeinated beverages per day after treatment. You can still have your cup of coffee or soda but these beverages do not count as part of your 8 to 12 cups that you need to drink daily. No alcohol intake.   Medications Continue taking your normal prescription medication as prescribed.  If you start any new herbal or new supplements please let us know first to make sure it is safe.   Infection Prevention Please wash your hands for at least 30 seconds using warm soapy water. Handwashing is the #1 way to prevent the spread of germs. Stay away from sick people or people who are getting over a cold. If you develop respiratory systems such as green/yellow mucus production or productive cough or persistent cough let us know and we will see if you need an antibiotic. It is a good idea to keep a pair of gloves on when going into grocery stores/[employer] to decrease your risk of coming into contact with germs on the carts, etc. Carry alcohol hand gel with you at all times and use it frequently if out in public. If your temperature reaches 100.4 or higher please call the clinic and let us know.  If it is after hours or on the weekend please go to the ER if your temperature is over 100.4.  Please have your own personal thermometer at home to use.     Sex and bodily fluids If you are going to have sex, a condom must be used to protect the person that isn't taking immunotherapy. For a few days after treatment, immunotherapy can be excreted through your bodily fluids.  When using the toilet please close the lid and flush the toilet twice.  Do this for a few day after you have had immunotherapy.    Contraception It is not known for sure whether or not immunotherapy drugs can be passed on through semen or secretions from the vagina. Because of this some doctors advise people to use a barrier method if you have sex during treatment.  This applies to vaginal, anal or oral sex.   Generally, doctors advise a barrier method only for the time you are actually having the treatment and for about a week after your treatment.   Advice like this can be worrying, but this does not mean that you have to avoid being intimate with your partner. You can still have close contact with your partner and continue to enjoy sex.   Animals If you have cats or birds we just ask that you not change the litter or change the cage.  Please have someone else do this for you while you are on immunotherapy.    Food Safety During and After Cancer Treatment Food safety is important for people both during and after cancer treatment. Cancer and cancer treatments, such as chemotherapy, radiation therapy, and stem  cell/bone marrow transplantation, often weaken the immune system. This makes it harder for your body to protect itself from foodborne illness, also called food poisoning. Foodborne illness is caused by eating food that contains harmful bacteria, parasites, or viruses.   Foods to avoid Some foods have a higher risk of becoming tainted with bacteria. These include: Unwashed fresh fruit and vegetables, especially leafy vegetables that can hide dirt and other contaminants Raw sprouts, such as alfalfa sprouts Raw or undercooked beef, especially ground beef, or other raw or undercooked meat and poultry Fatty, fried, or spicy foods immediately before or after treatment.  These can sit heavy on your stomach and make you feel nauseous. Raw or undercooked shellfish, such as oysters. Sushi and sashimi, which often contain raw fish.  Unpasteurized beverages, such as unpasteurized fruit juices, raw milk, raw yogurt, or cider Undercooked eggs, such as soft boiled, over easy, and poached; raw, unpasteurized eggs; or foods made with raw egg, such as homemade raw cookie dough and homemade mayonnaise   Simple steps for food safety   Shop smart. Do not buy food  stored or displayed in an unclean area. Do not buy bruised or damaged fruits or vegetables. Do not buy cans that have cracks, dents, or bulges. Pick up foods that can spoil at the end of your shopping trip and store them in a cooler on the way home.   Prepare and clean up foods carefully. Rinse all fresh fruits and vegetables under running water, and dry them with a clean towel or paper towel. Clean the top of cans before opening them. After preparing food, wash your hands for 20 seconds with hot water and soap. Pay special attention to areas between fingers and under nails. Clean your utensils and dishes with hot water and soap. Disinfect your kitchen and cutting boards using 1 teaspoon of liquid, unscented bleach mixed into 1 quart of water.     Dispose of old food. Eat canned and packaged food before its expiration date (the use by or best before date). Consume refrigerated leftovers within 3 to 4 days. After that time, throw out the food. Even if the food does not smell or look spoiled, it still may be unsafe. Some bacteria, such as Listeria, can grow even on foods stored in the refrigerator if they are kept for too long.   Take precautions when eating out. At restaurants, avoid buffets and salad bars where food sits out for a long time and comes in contact with many people. Food can become contaminated when someone with a virus, often a norovirus, or another bug handles it. Put any leftover food in a to-go container yourself, rather than having the server do it. And, refrigerate leftovers as soon as you get home. Choose restaurants that are clean and that are willing to prepare your food as you order it cooked.       Wear comfortable clothing and clothing appropriate for easy access to any Portacath or PICC line. Let us know if there is anything that we can do to make your therapy better!     What to do if you need assistance after hours or on the weekends: CALL [phone].   HOLD on the line, do not hang up.  You will hear multiple messages but at the end you will be connected with a nurse triage line.  They will contact the doctor if necessary.  Most of the time they will be able to assist you.  Do not call the  hospital operator.     I have been informed and understand all of the instructions given to me and have received a copy. I have been instructed to call the clinic [phone] or my family physician as soon as possible for continued medical care, if indicated. I do not have any more questions at this time but understand that I may call the [facility] or the Patient Navigator at [phone] during office hours should I have questions or need assistance in obtaining follow-up care.

## 2021-11-15 NOTE — Progress Notes (Signed)
Pharmacist Chemotherapy Monitoring - Initial Assessment    Anticipated start date: 11/19/21   The following has been reviewed per standard work regarding the patient's treatment regimen: [name] diagnosis, treatment plan and drug doses, and organ/hematologic function Lab orders and baseline tests specific to treatment regimen  The treatment plan start date, drug sequencing, and pre-medications Prior authorization status  Patient's documented medication list, including drug-drug interaction screen and prescriptions for anti-emetics and supportive care specific to the treatment regimen The drug concentrations, fluid compatibility, administration routes, and timing of the medications to be used [name] access for treatment and lifetime cumulative dose history, if applicable  [name] medication allergies and previous infusion related reactions, if applicable   Changes made to treatment plan:  N/A  Follow up needed:  N/A   Wynona Neat, [facility], 11/15/2021  4:17 PM

## 2021-11-16 ENCOUNTER — Encounter (HOSPITAL_COMMUNITY): Payer: Self-pay

## 2021-11-19 ENCOUNTER — Inpatient Hospital Stay (HOSPITAL_COMMUNITY): Payer: No Typology Code available for payment source

## 2021-11-19 ENCOUNTER — Inpatient Hospital Stay (HOSPITAL_BASED_OUTPATIENT_CLINIC_OR_DEPARTMENT_OTHER): Payer: No Typology Code available for payment source | Admitting: Hematology

## 2021-11-19 ENCOUNTER — Other Ambulatory Visit: Payer: Self-pay

## 2021-11-19 VITALS — BP 117/52 | HR 65 | Temp 98.4°F | Resp 18

## 2021-11-19 VITALS — BP 122/58 | HR 64 | Temp 98.3°F | Resp 18 | Ht 66.0 in | Wt 193.2 lb

## 2021-11-19 DIAGNOSIS — C50912 Malignant neoplasm of unspecified site of left female breast: Secondary | ICD-10-CM

## 2021-11-19 DIAGNOSIS — Z87891 Personal history of nicotine dependence: Secondary | ICD-10-CM | POA: Diagnosis not present

## 2021-11-19 DIAGNOSIS — E119 Type 2 diabetes mellitus without complications: Secondary | ICD-10-CM | POA: Diagnosis not present

## 2021-11-19 DIAGNOSIS — Z8051 Family history of malignant neoplasm of kidney: Secondary | ICD-10-CM | POA: Diagnosis not present

## 2021-11-19 DIAGNOSIS — Z79899 Other long term (current) drug therapy: Secondary | ICD-10-CM | POA: Diagnosis not present

## 2021-11-19 DIAGNOSIS — Z8042 Family history of malignant neoplasm of prostate: Secondary | ICD-10-CM | POA: Diagnosis not present

## 2021-11-19 DIAGNOSIS — C641 Malignant neoplasm of right kidney, except renal pelvis: Secondary | ICD-10-CM

## 2021-11-19 DIAGNOSIS — Z803 Family history of malignant neoplasm of breast: Secondary | ICD-10-CM | POA: Diagnosis not present

## 2021-11-19 DIAGNOSIS — Z801 Family history of malignant neoplasm of trachea, bronchus and lung: Secondary | ICD-10-CM | POA: Diagnosis not present

## 2021-11-19 DIAGNOSIS — I10 Essential (primary) hypertension: Secondary | ICD-10-CM | POA: Diagnosis not present

## 2021-11-19 DIAGNOSIS — Z808 Family history of malignant neoplasm of other organs or systems: Secondary | ICD-10-CM | POA: Diagnosis not present

## 2021-11-19 DIAGNOSIS — Z800 Family history of malignant neoplasm of digestive organs: Secondary | ICD-10-CM | POA: Diagnosis not present

## 2021-11-19 DIAGNOSIS — D649 Anemia, unspecified: Secondary | ICD-10-CM | POA: Diagnosis not present

## 2021-11-19 DIAGNOSIS — Z905 Acquired absence of kidney: Secondary | ICD-10-CM | POA: Diagnosis not present

## 2021-11-19 DIAGNOSIS — Z5112 Encounter for antineoplastic immunotherapy: Secondary | ICD-10-CM | POA: Diagnosis not present

## 2021-11-19 LAB — CBC WITH DIFFERENTIAL/PLATELET
Abs Immature Granulocytes: 0.02 10*3/uL (ref 0.00–0.07)
Basophils Absolute: 0 10*3/uL (ref 0.0–0.1)
Basophils Relative: 0 %
Eosinophils Absolute: 0 10*3/uL (ref 0.0–0.5)
Eosinophils Relative: 1 %
HCT: 32 % — ABNORMAL LOW (ref 36.0–46.0)
Hemoglobin: 10 g/dL — ABNORMAL LOW (ref 12.0–15.0)
Immature Granulocytes: 0 %
Lymphocytes Relative: 24 %
Lymphs Abs: 1.4 10*3/uL (ref 0.7–4.0)
MCH: 28.9 pg (ref 26.0–34.0)
MCHC: 31.3 g/dL (ref 30.0–36.0)
MCV: 92.5 fL (ref 80.0–100.0)
Monocytes Absolute: 0.4 10*3/uL (ref 0.1–1.0)
Monocytes Relative: 8 %
Neutro Abs: 3.8 10*3/uL (ref 1.7–7.7)
Neutrophils Relative %: 67 %
Platelets: 346 10*3/uL (ref 150–400)
RBC: 3.46 MIL/uL — ABNORMAL LOW (ref 3.87–5.11)
RDW: 13.5 % (ref 11.5–15.5)
WBC: 5.7 10*3/uL (ref 4.0–10.5)
nRBC: 0 % (ref 0.0–0.2)

## 2021-11-19 LAB — COMPREHENSIVE METABOLIC PANEL
ALT: 10 U/L (ref 0–44)
AST: 15 U/L (ref 15–41)
Albumin: 3.6 g/dL (ref 3.5–5.0)
Alkaline Phosphatase: 97 U/L (ref 38–126)
Anion gap: 8 (ref 5–15)
BUN: 15 mg/dL (ref 8–23)
CO2: 25 mmol/L (ref 22–32)
Calcium: 9.3 mg/dL (ref 8.9–10.3)
Chloride: 102 mmol/L (ref 98–111)
Creatinine, Ser: 0.97 mg/dL (ref 0.44–1.00)
GFR, Estimated: >60 mL/min (ref >60)
Glucose, Bld: 121 mg/dL — ABNORMAL HIGH (ref 70–99)
Potassium: 3.9 mmol/L (ref 3.5–5.1)
Sodium: 135 mmol/L (ref 135–145)
Total Bilirubin: 0.2 mg/dL — ABNORMAL LOW (ref 0.3–1.2)
Total Protein: 7.4 g/dL (ref 6.5–8.1)

## 2021-11-19 LAB — TSH: TSH: 1.281 u[IU]/mL (ref 0.350–4.500)

## 2021-11-19 LAB — MAGNESIUM: Magnesium: 2 mg/dL (ref 1.7–2.4)

## 2021-11-19 MED ORDER — FAMOTIDINE IN NACL 20-0.9 MG/50ML-% IV SOLN
20.0000 mg | Freq: Once | INTRAVENOUS | Status: AC
  Administered 2021-11-19: 20 mg via INTRAVENOUS
  Filled 2021-11-19: qty 50

## 2021-11-19 MED ORDER — SODIUM CHLORIDE 0.9 % IV SOLN: Freq: Once | INTRAVENOUS | Status: AC

## 2021-11-19 MED ORDER — SODIUM CHLORIDE 0.9 % IV SOLN
240.0000 mg | Freq: Once | INTRAVENOUS | Status: AC
  Administered 2021-11-19: 240 mg via INTRAVENOUS
  Filled 2021-11-19: qty 24

## 2021-11-19 MED ORDER — DIPHENHYDRAMINE HCL 50 MG/ML IJ SOLN
25.0000 mg | Freq: Once | INTRAMUSCULAR | Status: AC
  Administered 2021-11-19: 25 mg via INTRAVENOUS
  Filled 2021-11-19: qty 1

## 2021-11-19 MED ORDER — SODIUM CHLORIDE 0.9% FLUSH
10.0000 mL | INTRAVENOUS | Status: DC | PRN
  Administered 2021-11-19: 10 mL

## 2021-11-19 MED ORDER — HEPARIN SOD (PORK) LOCK FLUSH 100 UNIT/ML IV SOLN
500.0000 [IU] | Freq: Once | INTRAVENOUS | Status: AC | PRN
  Administered 2021-11-19: 500 [IU]

## 2021-11-19 MED ORDER — SODIUM CHLORIDE 0.9 % IV SOLN
1.0000 mg/kg | Freq: Once | INTRAVENOUS | Status: AC
  Administered 2021-11-19: 90 mg via INTRAVENOUS
  Filled 2021-11-19: qty 18

## 2021-11-19 NOTE — Progress Notes (Signed)
Patient has been examined by Dr. Katragadda, and vital signs and labs have been reviewed. ANC, Creatinine, LFTs, hemoglobin, and platelets are within treatment parameters per M.D. - pt may proceed with treatment.    °

## 2021-11-19 NOTE — Progress Notes (Signed)
Immunotherapy education packet given and discussed with pt in detail.  Discussed diagnosis, staging, tx regimen, and intent of tx.  Reviewed immunotherapy medications and side effects.  Instructed on how to manage side effects at home, and when to call the clinic.  Importance of fever/chills discussed with pt and family. Discussed precautions to implement at home after receiving tx, as well as self care strategies. Phone numbers provided for clinic during regular working hours, also how to reach the clinic after hours and on weekends. Pt provided the opportunity to ask questions - all questions answered to pt's satisfaction.     

## 2021-11-19 NOTE — Progress Notes (Signed)

## 2021-11-19 NOTE — Progress Notes (Signed)
Patient presents today for D1C1 of [place]. Yervoy checklist completed and consent signed.  Patient tolerated chemotherapy with no complaints voiced. Side effects with management reviewed understanding verbalized. Port site clean and dry with no bruising or swelling noted at site. Good blood return noted before and after administration of chemotherapy. Band aid applied. Patient left in satisfactory condition with VSS and no s/s of distress noted.

## 2021-11-19 NOTE — Patient Instructions (Signed)
[place]  Discharge Instructions: Thank you for choosing [facility] to provide your oncology and hematology care.  If you have a lab appointment with the [facility], please come in thru the Main Entrance and check in at the main information desk.  Wear comfortable clothing and clothing appropriate for easy access to any Portacath or PICC line.   We strive to give you quality time with your provider. You may need to reschedule your appointment if you arrive late (15 or more minutes).  Arriving late affects you and other patients whose appointments are after yours.  Also, if you miss three or more appointments without notifying the office, you may be dismissed from the clinic at the providers discretion.      For prescription refill requests, have your pharmacy contact our office and allow 72 hours for refills to be completed.    Today you received the following chemotherapy and/or immunotherapy agents Opdivo and Yervoy, return as scheduled.   To help prevent nausea and vomiting after your treatment, we encourage you to take your nausea medication as directed.  BELOW ARE SYMPTOMS THAT SHOULD BE REPORTED IMMEDIATELY: *FEVER GREATER THAN 100.4 F (38 C) OR HIGHER *CHILLS OR SWEATING *NAUSEA AND VOMITING THAT IS NOT CONTROLLED WITH YOUR NAUSEA MEDICATION *UNUSUAL SHORTNESS OF BREATH *UNUSUAL BRUISING OR BLEEDING *URINARY PROBLEMS (pain or burning when urinating, or frequent urination) *BOWEL PROBLEMS (unusual diarrhea, constipation, pain near the anus) TENDERNESS IN MOUTH AND THROAT WITH OR WITHOUT PRESENCE OF ULCERS (sore throat, sores in mouth, or a toothache) UNUSUAL RASH, SWELLING OR PAIN  UNUSUAL VAGINAL DISCHARGE OR ITCHING   Items with * indicate a potential emergency and should be followed up as soon as possible or go to the Emergency Department if any problems should occur.  Please show the CHEMOTHERAPY ALERT CARD or IMMUNOTHERAPY ALERT CARD at  check-in to the Emergency Department and triage nurse.  Should you have questions after your visit or need to cancel or reschedule your appointment, please contact [facility] [phone]  and follow the prompts.  Office hours are 8:00 a.m. to 4:30 p.m. Monday - Friday. Please note that voicemails left after 4:00 p.m. may not be returned until the following business day.  We are closed weekends and major holidays. You have access to a nurse at all times for urgent questions. Please call the main number to the clinic [phone] and follow the prompts.  For any non-urgent questions, you may also contact your provider using MyChart. We now offer e-Visits for anyone 81 and older to request care online for non-urgent symptoms. For details visit [email or web address].   Also download the MyChart app! Go to the app store, search "MyChart", open the app, select [facility], and log in with your MyChart username and password.  Due to Covid, a mask is required upon entering the hospital/clinic. If you do not have a mask, one will be given to you upon arrival. For doctor visits, patients may have 1 support person aged 52 or older with them. For treatment visits, patients cannot have anyone with them due to current Covid guidelines and our immunocompromised population.

## 2021-11-19 NOTE — Progress Notes (Signed)
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 No pathogenic variants identified on the [facility] Multi-Cancer Panel + RNA. VUS in PMS2 called c.1732C>A and in RECQL4 called c.2967G>A  Identified. The report date is 11/01/2021.   The Multi-Cancer Panel + RNA offered by [facility] includes sequencing and/or deletion duplication testing of the following 84 genes: AIP, ALK, APC, ATM, AXIN2,BAP1,  BARD1, BLM, BMPR1A, BRCA1, BRCA2, BRIP1, CASR, CDC73, CDH1, CDK4, CDKN1B, CDKN1C, CDKN2A (p14ARF), CDKN2A (p16INK4a), CEBPA, CHEK2, CTNNA1, DICER1, DIS3L2, EGFR (c.2369C>T, p.Thr790Met variant only), EPCAM (Deletion/duplication testing only), FH, FLCN, GATA2, GPC3, GREM1 (Promoter region deletion/duplication testing only), HOXB13 (c.251G>A, p.Gly84Glu), HRAS, KIT, MAX, MEN1, MET, MITF (c.952G>A, p.Glu318Lys variant only), MLH1, MSH2, MSH3, MSH6, MUTYH, NBN, NF1, NF2, NTHL1, PALB2, PDGFRA, PHOX2B, PMS2, POLD1, POLE, POT1, PRKAR1A, PTCH1, PTEN, RAD50, RAD51C, RAD51D, RB1, RECQL4, RET, RUNX1, SDHAF2, SDHA (sequence changes only), SDHB, SDHC, SDHD, SMAD4, SMARCA4, SMARCB1, SMARCE1, STK11, SUFU, TERC, TERT, TMEM127, TP53, TSC1, TSC2, VHL, WRN and WT1.      CANCER STAGING:  Cancer Staging  Malignant neoplasm of female breast  [facility]) Staging form: Breast, AJCC 6th Edition - Clinical: Stage I (T1b, N0, M0) - Signed by Baird Cancer, PA on 08/16/2011  Renal cell carcinoma of right kidney [facility]) Staging form: Kidney, AJCC 8th Edition - Clinical stage from 09/26/2021: Stage IV (cT3c, cNX, cM1) - Unsigned   INTERVAL HISTORY:  Deanna Bush, a 61 y.o. female, returns for routine follow-up and consideration for next cycle of chemotherapy. Deanna Bush was last seen on 10/30/2021.  Due for cycle #1 of Nivolumab + Ipilimumab today.   Overall, she tells me she has been feeling pretty well. She denies new pains. Her appetite is good. She denies current bleeding and black stools. She had stopped taking her iron tablets due to constipation. She reports a history of Graves' disease.   Overall, she feels ready for next cycle of chemo today.   REVIEW OF SYSTEMS:  Review of Systems  Constitutional:  Negative for appetite change and fatigue.  HENT:   Negative for nosebleeds.   Respiratory:  Negative for hemoptysis.   Gastrointestinal:  Negative for blood in stool.  Genitourinary:  Negative for hematuria and vaginal bleeding.   Hematological:  Does not bruise/bleed easily.  All other systems reviewed and are negative.  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[place])    left    Cellulitis of leg, left    Complication of anesthesia    Hard to wake up   Diabetes mellitus without complication ([place])    Pt denies   Family history of breast cancer 08/16/2011   Family history of breast cancer    Family history of colon cancer    Family history of  kidney cancer    Family history of prostate cancer    Hyperlipidemia    Hypertension    Hypothyroidism    MRSA (methicillin resistant staph aureus) culture positive 08/19/2011   Pre-diabetes    Past Surgical History:  Procedure Laterality Date   BREAST SURGERY Left    mastectomy   CESAREAN SECTION     x2   COLONOSCOPY  N/A 03/03/2020   Procedure: COLONOSCOPY;  Surgeon: Daneil Dolin, MD;  Location: [facility];  Service: Endoscopy;  Laterality: N/A;  12:00   IR IMAGING GUIDED PORT INSERTION  11/07/2021   left mastectomy     NEPHRECTOMY Right 02/06/2021   Procedure: NEPHRECTOMY- open radical;  Surgeon: Cleon Gustin, MD;  Location: [facility];  Service: Urology;  Laterality: Right;   POLYPECTOMY  03/03/2020   Procedure: POLYPECTOMY;  Surgeon: Daneil Dolin, MD;  Location: [facility];  Service: Endoscopy;;    SOCIAL HISTORY:  Social History   Socioeconomic History   Marital status: Married    Spouse name: Not on file   Number of children: 2   Years of education: Not on file   Highest education level: Not on file  Occupational History   Occupation: CNA  Tobacco Use   Smoking status: Former    Packs/day: 0.50    Years: 18.00    Pack years: 9.00    Types: Cigarettes    Quit date: 07/01/2002    Years since quitting: 19.4   Smokeless tobacco: Never  Vaping Use   Vaping Use: Never used  Substance and Sexual Activity   Alcohol use: No   Drug use: No   Sexual activity: Not Currently  Other Topics Concern   Not on file  Social History Narrative   Not on file   Social Determinants of Health   Financial Resource Strain: Not on file  Food Insecurity: Not on file  Transportation Needs: Not on file  Physical Activity: Not on file  Stress: Not on file  Social Connections: Not on file  Intimate Partner Violence: Not on file    FAMILY HISTORY:  Family History  Problem Relation Age of Onset   Hypertension Mother    Diabetes Mother    Hyperlipidemia Mother    Colon cancer Maternal Aunt        dx 35s   Prostate cancer Maternal Uncle    Throat cancer Maternal Uncle    Kidney cancer Maternal Grandfather    Lung cancer Half-Sister    Kidney cancer Half-Sister 7   Breast cancer Half-Sister 67   Breast cancer Half-Sister 50    CURRENT MEDICATIONS:  Current Outpatient Medications   Medication Sig Dispense Refill   acetaminophen (TYLENOL) 500 MG tablet Take 500 mg by mouth every 6 (six) hours as needed for moderate pain or headache.     amLODipine (NORVASC) 5 MG tablet TAKE 1 TABLET (5 MG TOTAL) BY MOUTH DAILY. 90 tablet 3   atorvastatin (LIPITOR) 10 MG tablet TAKE ONE TABLET BY MOUTH ONCE DAILY 90 tablet 3   EPINEPHrine 0.3 mg/0.3 mL IJ SOAJ injection Inject 0.3 mg into the muscle as needed for anaphylaxis. 2 each 0   Ipilimumab (YERVOY IV) Inject into the vein. Every 21 days x 4 cycles     Iron, Ferrous Sulfate, 325 (65 Fe) MG TABS Take 325 mg by mouth daily. 30 tablet 3   levothyroxine (SYNTHROID) 88 MCG tablet TAKE 1 TABLET(88 MCG) BY MOUTH DAILY BEFORE AND BREAKFAST 90 tablet 1  lidocaine-prilocaine (EMLA) cream Apply to affected area once 30 g 3   Multiple Vitamin (MULTIVITAMIN WITH MINERALS) TABS tablet Take 1 tablet by mouth daily.     Nivolumab (OPDIVO IV) Inject into the vein every 21 ( twenty-one) days. Every 21 days x 4 cycles then every 28 days     UNABLE TO FIND MASTECTOMY BRA AND PROTHESIS DX Z90.12 6 each 2   UNABLE TO FIND 6 mastectomy prosthesis and bras. 6 each 0   No current facility-administered medications for this visit.    ALLERGIES:  Allergies  Allergen Reactions   Sulfonamide Derivatives Itching   Yellow Jacket Venom [Bee Venom] Rash    urticaria    PHYSICAL EXAM:  Performance status (ECOG): 0 - Asymptomatic  Vitals:   11/19/21 0842  BP: (!) 122/58  Pulse: 64  Resp: 18  Temp: 98.3 F (36.8 C)  SpO2: 99%   Wt Readings from Last 3 Encounters:  11/19/21 193 lb 3.2 oz (87.6 kg)  11/07/21 195 lb (88.5 kg)  10/30/21 196 lb 9.6 oz (89.2 kg)   Physical Exam Vitals reviewed.  Constitutional:      Appearance: Normal appearance. She is obese.  Cardiovascular:     Rate and Rhythm: Normal rate and regular rhythm.     Pulses: Normal pulses.     Heart sounds: Normal heart sounds.  Pulmonary:     Effort: Pulmonary effort is  normal.     Breath sounds: Normal breath sounds.  Neurological:     General: No focal deficit present.     Mental Status: She is alert and oriented to person, place, and time.  Psychiatric:        Mood and Affect: Mood normal.        Behavior: Behavior normal.    LABORATORY DATA:  I have reviewed the labs as listed.  CBC Latest Ref Rng & Units 11/19/2021 10/22/2021 09/27/2021  WBC 4.0 - 10.5 K/uL 5.7 6.0 5.4  Hemoglobin 12.0 - 15.0 g/dL 10.0(L) 10.2(L) 10.9(L)  Hematocrit 36.0 - 46.0 % 32.0(L) 32.2(L) 34.6(L)  Platelets 150 - 400 K/uL 346 317 320   CMP Latest Ref Rng & Units 11/19/2021 09/27/2021 09/18/2021  Glucose 70 - 99 mg/dL 121(H) 121(H) -  BUN 8 - 23 mg/dL 15 15 -  Creatinine 0.44 - 1.00 mg/dL 0.97 0.91 1.20(H)  Sodium 135 - 145 mmol/L 135 136 -  Potassium 3.5 - 5.1 mmol/L 3.9 4.1 -  Chloride 98 - 111 mmol/L 102 104 -  CO2 22 - 32 mmol/L 25 25 -  Calcium 8.9 - 10.3 mg/dL 9.3 9.4 -  Total Protein 6.5 - 8.1 g/dL 7.4 7.9 -  Total Bilirubin 0.3 - 1.2 mg/dL 0.2(L) 0.3 -  Alkaline Phos 38 - 126 U/L 97 96 -  AST 15 - 41 U/L 15 15 -  ALT 0 - 44 U/L 10 11 -    DIAGNOSTIC IMAGING:  I have independently reviewed the scans and discussed with the patient. CT GUIDED NEEDLE PLACEMENT  Result Date: 10/22/2021 INDICATION: Renal cell carcinoma, right nephrectomy site masslike abnormalities by CT consistent with local recurrence. EXAM: CT CORE BIOPSIES RIGHT NEPHRECTOMY BED RECURRENT MASS MEDICATIONS: 1% LIDOCAINE LOCAL ANESTHESIA/SEDATION: Moderate (conscious) sedation was employed during this procedure. A total of Versed 2.0 mg and Fentanyl 75 mcg was administered intravenously by the radiology nurse. Total intra-service moderate Sedation Time: 16 minutes. [name] level of consciousness and vital signs were monitored continuously by radiology nursing throughout the procedure under my direct  supervision. FLUOROSCOPY TIME:  Fluoroscopy Time: NONE. COMPLICATIONS: None immediate.  PROCEDURE: Informed written consent was obtained from the patient after a thorough discussion of the procedural risks, benefits and alternatives. All questions were addressed. Maximal Sterile Barrier Technique was utilized including caps, mask, sterile gowns, sterile gloves, sterile drape, hand hygiene and skin antiseptic. A timeout was performed prior to the initiation of the procedure. Previous imaging reviewed. Preliminary ultrasound performed. Patient positioned prone. Noncontrast localization CT performed. The nodular mass in the right nephrectomy site was localized and marked for a posterior approach. Under sterile conditions and local anesthesia, a 17 gauge 11.8 cm access needle was advanced from a posterior approach to the right nephrectomy bed lesion. Needle position confirmed with CT. 18 gauge core biopsies obtained. Samples were intact and non fragmented. These were placed in formalin. Needle tract occluded with Gel-Foam. Postprocedure imaging demonstrates no hemorrhage or hematoma. Patient tolerated the biopsy well. IMPRESSION: Successful CT-guided right nephrectomy bed mass 18 gauge core biopsy Electronically Signed   By: Jerilynn Mages.  Shick M.D.   On: 10/22/2021 15:12   IR IMAGING GUIDED PORT INSERTION  Result Date: 11/07/2021 INDICATION: 61 year old female with history of renal cell carcinoma requiring central venous access for chemotherapy administration. EXAM: IMPLANTED PORT A CATH PLACEMENT WITH ULTRASOUND AND FLUOROSCOPIC GUIDANCE COMPARISON:  None. MEDICATIONS: None. ANESTHESIA/SEDATION: Moderate (conscious) sedation was employed during this procedure. A total of Versed 2 mg and Fentanyl 100 mcg was administered intravenously. Moderate Sedation Time: 10 minutes. [name] level of consciousness and vital signs were monitored continuously by radiology nursing throughout the procedure under my direct supervision. CONTRAST:  None FLUOROSCOPY TIME:  0 minutes, 6 seconds (1 mGy) COMPLICATIONS: None  immediate. PROCEDURE: The procedure, risks, benefits, and alternatives were explained to the patient. Questions regarding the procedure were encouraged and answered. The patient understands and consents to the procedure. The right neck and chest were prepped with chlorhexidine in a sterile fashion, and a sterile drape was applied covering the operative field. Maximum barrier sterile technique with sterile gowns and gloves were used for the procedure. A timeout was performed prior to the initiation of the procedure. Ultrasound was used to examine the jugular vein which was compressible and free of internal echoes. A skin marker was used to demarcate the planned venotomy and port pocket incision sites. Local anesthesia was provided to these sites and the subcutaneous tunnel track with 1% lidocaine with 1:100,000 epinephrine. A small incision was created at the jugular access site and blunt dissection was performed of the subcutaneous tissues. Under ultrasound guidance, the jugular vein was accessed with a 21 ga micropuncture needle and an 0.018" wire was inserted to the superior vena cava. Real-time ultrasound guidance was utilized for vascular access including the acquisition of a permanent ultrasound image documenting patency of the accessed vessel. A 5 Fr micopuncture set was then used, through which a 0.035" Rosen wire was passed under fluoroscopic guidance into the inferior vena cava. An 8 Fr dilator was then placed over the wire. A subcutaneous port pocket was then created along the upper chest wall utilizing a combination of sharp and blunt dissection. The pocket was irrigated with sterile saline, packed with gauze, and observed for hemorrhage. A single lumen "ISP" sized power injectable port was chosen for placement. The 8 Fr catheter was tunneled from the port pocket site to the venotomy incision. The port was placed in the pocket. The external catheter was trimmed to appropriate length. The dilator was  exchanged for an 8 Fr  peel-away sheath under fluoroscopic guidance. The catheter was then placed through the sheath and the sheath was removed. Final catheter positioning was confirmed and documented with a fluoroscopic spot radiograph. The port was accessed with a Huber needle, aspirated, and flushed with heparinized saline. The deep dermal layer of the port pocket incision was closed with interrupted 2-0 Vicryl suture. The skin was opposed with a running subcuticular 4-0 Monocryl suture. Dermabond was then placed over the port pocket and neck incisions. The patient tolerated the procedure well without immediate post procedural complication. FINDINGS: After catheter placement, the tip lies within the superior cavoatrial junction. The catheter aspirates and flushes normally and is ready for immediate use. IMPRESSION: Successful placement of a power injectable Port-A-Cath via the right internal jugular vein. The catheter is ready for immediate use. Ruthann Cancer, MD [facility] Electronically Signed   By: Ruthann Cancer M.D.   On: 11/07/2021 12:05     ASSESSMENT:  Metastatic clear-cell renal cell carcinoma: - Right radical nephrectomy on 02/06/2021. - Pathology shows clear-cell RCC, grade 4, 12.5 cm, necrosis and rhabdoid features are identified.  Tumor extends into the and invades the wall of the vena cava (pT3c).  Vascular, ureteral and all margins of resection are negative for tumor. - CT scan abdomen with and without contrast on 05/29/2021 showed several nodules along the peritoneal surface of the right nephrectomy bed warranting close attention. - CTAP with and without contrast on 09/18/2021 showed progressive nodularity within the right nephrectomy bed, with enlarged nodules adjacent to the right hepatic lobe extending inferiorly along the right retroperitoneum with multiple enlarged enhancing nodules.  Direct metastatic invasion into the right  hepatic lobe.  Small nodule at the right lung base favored to be benign. - IMDC: Intermediate risk group with 2 features including diagnosis less than 1 year and hemoglobin lower than normal. - Bone scan on 10/05/2021 with focal activity in the mental vertex of the mandible which could be inflammatory/traumatic/metastatic. - CT chest on 10/03/2021 showed several lung nodules scattered bilaterally some of which could be metastatic and many others could be due to mucoid impaction. - NGS testing did not show any targetable mutations.  TMB was low.  CCND3 amplified.  PBRM1 VUS present.    Social/family history: - She lives at home with her husband and also takes care of her mother. - She works as a Buyer, retail at [employer].  She quit smoking in 2004 and smoked half pack per day for 10 years. - Half sister (same mother) had kidney cancer at age 2.  Maternal grandfather had kidney cancer.  Sister died of lung cancer.  2 half sisters (same father) had breast cancers.  3.  Left breast stage I tubular adenocarcinoma: - She underwent left mastectomy on 06/11/2002.  0/6 lymph nodes positive.  ER 90%, PR 92%, Ki-67 6%, HER2 negative.  As per chart, declined antiestrogen therapy.   PLAN:  Metastatic clear-cell RCC, intermediate risk by IMDC: -We have reviewed results of NGS testing. - We have reviewed her labs today which showed normal LFTs.  CBC shows normocytic anemia.  White count and platelets are normal. - We discussed treatment plan with combination of opdivo under wire every 3 weeks for 4 cycles followed by maintenance Opdivo. - We discussed side effects in detail.  We obtained a TSH today it was 1.28.  She has a history of Graves' disease and is on thyroid medication.  We will closely monitor. - She  will proceed with her cycle 1 today.  2.  Normocytic anemia: - We will do anemia work-up at next visit.    Orders placed this encounter:  No orders of the defined types were placed  in this encounter.    Derek Jack, MD [place] [phone]   I, Thana Ates, am acting as a scribe for Dr. Derek Jack.  I, Derek Jack MD, have reviewed the above documentation for accuracy and completeness, and I agree with the above.

## 2021-11-19 NOTE — Patient Instructions (Addendum)
[place] at [facility] Discharge Instructions   You were seen and examined today by Dr. Delton Coombes.  He reviewed your lab work which is normal/stable.  Your hemoglobin has dropped slightly.  Resume taking iron tablets every other day.  We will proceed with your treatment today.  Return as scheduled for lab work, office visit, and treatment.    Thank you for choosing [place] at [facility] to provide your oncology and hematology care.  To afford each patient quality time with our provider, please arrive at least 15 minutes before your scheduled appointment time.   If you have a lab appointment with the [facility] please come in thru the Main Entrance and check in at the main information desk.  You need to re-schedule your appointment should you arrive 10 or more minutes late.  We strive to give you quality time with our providers, and arriving late affects you and other patients whose appointments are after yours.  Also, if you no show three or more times for appointments you may be dismissed from the clinic at the providers discretion.     Again, thank you for choosing [facility].  Our hope is that these requests will decrease the amount of time that you wait before being seen by our physicians.       _____________________________________________________________  Should you have questions after your visit to [facility], please contact our office at [phone] and follow the prompts.  Our office hours are 8:00 a.m. and 4:30 p.m. Monday - Friday.  Please note that voicemails left after 4:00 p.m. may not be returned until the following business day.  We are closed weekends and major holidays.  You do have access to a nurse 24-7, just call the main number to the clinic [phone] and do not press any options, hold on the line and a nurse will answer the phone.    For prescription refill requests, have your  pharmacy contact our office and allow 72 hours.    Due to Covid, you will need to wear a mask upon entering the hospital. If you do not have a mask, a mask will be given to you at the Main Entrance upon arrival. For doctor visits, patients may have 1 support person age 84 or older with them. For treatment visits, patients can not have anyone with them due to social distancing guidelines and our immunocompromised population.

## 2021-11-20 ENCOUNTER — Telehealth (HOSPITAL_COMMUNITY): Payer: Self-pay

## 2021-11-20 NOTE — Telephone Encounter (Signed)
Patient called for 24 hour follow-up. Patient states she feels good and went to work this morning. Patient did have a period of nausea but it subsided, patient states she does not have any nausea medication at home but denied wanting any ordered at this moment. Patient informed to call [facility] if nausea continues.

## 2021-11-27 ENCOUNTER — Encounter (HOSPITAL_COMMUNITY): Payer: Self-pay | Admitting: Hematology

## 2021-11-28 ENCOUNTER — Encounter (HOSPITAL_COMMUNITY): Payer: Self-pay | Admitting: Emergency Medicine

## 2021-11-28 NOTE — Progress Notes (Signed)
FMLA paperwork completed and faxed to [facility] center at [phone].  Conformation number [id number].

## 2021-11-29 ENCOUNTER — Ambulatory Visit (HOSPITAL_COMMUNITY): Payer: No Typology Code available for payment source

## 2021-11-29 ENCOUNTER — Other Ambulatory Visit (HOSPITAL_COMMUNITY): Payer: No Typology Code available for payment source

## 2021-11-29 ENCOUNTER — Ambulatory Visit (HOSPITAL_COMMUNITY): Payer: No Typology Code available for payment source | Admitting: Hematology

## 2021-12-07 ENCOUNTER — Other Ambulatory Visit: Payer: Self-pay

## 2021-12-07 ENCOUNTER — Encounter: Payer: Self-pay | Admitting: Family Medicine

## 2021-12-07 ENCOUNTER — Ambulatory Visit: Payer: No Typology Code available for payment source | Admitting: Family Medicine

## 2021-12-07 VITALS — BP 121/71 | HR 74 | Ht 66.0 in | Wt 191.0 lb

## 2021-12-07 DIAGNOSIS — Z1231 Encounter for screening mammogram for malignant neoplasm of breast: Secondary | ICD-10-CM | POA: Diagnosis not present

## 2021-12-07 DIAGNOSIS — E038 Other specified hypothyroidism: Secondary | ICD-10-CM | POA: Diagnosis not present

## 2021-12-07 DIAGNOSIS — C649 Malignant neoplasm of unspecified kidney, except renal pelvis: Secondary | ICD-10-CM

## 2021-12-07 DIAGNOSIS — E782 Mixed hyperlipidemia: Secondary | ICD-10-CM | POA: Diagnosis not present

## 2021-12-07 DIAGNOSIS — Z91030 Bee allergy status: Secondary | ICD-10-CM

## 2021-12-07 DIAGNOSIS — I10 Essential (primary) hypertension: Secondary | ICD-10-CM

## 2021-12-07 MED ORDER — EPINEPHRINE 0.3 MG/0.3ML IJ SOAJ: 0.3000 mg | INTRAMUSCULAR | 0 refills | Status: DC | PRN

## 2021-12-07 NOTE — Patient Instructions (Signed)
F/U mid July, call if you need me sooner ?' ?May labs when drawing for Oncology ? ?Please schedule July mammogram at checkout ? ?It is important that you exercise regularly at least 30 minutes 5 times a week. If you develop chest pain, have severe difficulty breathing, or feel very tired, stop exercising immediately and seek medical attention  ? ? ?Enjoy healthy plant based food ? ?Get sufficient rest ? ? ?I am with you and here for you ? ?Thanks for choosing [facility], we consider it a privelige to serve you. ? ?

## 2021-12-13 ENCOUNTER — Inpatient Hospital Stay (HOSPITAL_COMMUNITY): Payer: PRIVATE HEALTH INSURANCE

## 2021-12-13 ENCOUNTER — Encounter (HOSPITAL_COMMUNITY): Payer: Self-pay | Admitting: Hematology

## 2021-12-13 ENCOUNTER — Other Ambulatory Visit: Payer: Self-pay

## 2021-12-13 ENCOUNTER — Encounter (HOSPITAL_COMMUNITY): Payer: Self-pay

## 2021-12-13 ENCOUNTER — Inpatient Hospital Stay (HOSPITAL_COMMUNITY): Payer: PRIVATE HEALTH INSURANCE | Attending: Hematology

## 2021-12-13 ENCOUNTER — Inpatient Hospital Stay (HOSPITAL_BASED_OUTPATIENT_CLINIC_OR_DEPARTMENT_OTHER): Payer: PRIVATE HEALTH INSURANCE | Admitting: Hematology

## 2021-12-13 VITALS — BP 124/74 | HR 60 | Temp 97.4°F | Resp 18

## 2021-12-13 DIAGNOSIS — Z853 Personal history of malignant neoplasm of breast: Secondary | ICD-10-CM | POA: Insufficient documentation

## 2021-12-13 DIAGNOSIS — Z5112 Encounter for antineoplastic immunotherapy: Secondary | ICD-10-CM | POA: Insufficient documentation

## 2021-12-13 DIAGNOSIS — C641 Malignant neoplasm of right kidney, except renal pelvis: Secondary | ICD-10-CM

## 2021-12-13 DIAGNOSIS — C50912 Malignant neoplasm of unspecified site of left female breast: Secondary | ICD-10-CM

## 2021-12-13 DIAGNOSIS — D649 Anemia, unspecified: Secondary | ICD-10-CM | POA: Insufficient documentation

## 2021-12-13 DIAGNOSIS — Z79899 Other long term (current) drug therapy: Secondary | ICD-10-CM | POA: Diagnosis not present

## 2021-12-13 DIAGNOSIS — C787 Secondary malignant neoplasm of liver and intrahepatic bile duct: Secondary | ICD-10-CM | POA: Insufficient documentation

## 2021-12-13 DIAGNOSIS — E0500 Thyrotoxicosis with diffuse goiter without thyrotoxic crisis or storm: Secondary | ICD-10-CM | POA: Insufficient documentation

## 2021-12-13 LAB — VITAMIN B12: Vitamin B-12: 353 pg/mL (ref 180–914)

## 2021-12-13 LAB — CBC WITH DIFFERENTIAL/PLATELET
Abs Immature Granulocytes: 0.02 10*3/uL (ref 0.00–0.07)
Basophils Absolute: 0 10*3/uL (ref 0.0–0.1)
Basophils Relative: 0 %
Eosinophils Absolute: 0.1 10*3/uL (ref 0.0–0.5)
Eosinophils Relative: 2 %
HCT: 32 % — ABNORMAL LOW (ref 36.0–46.0)
Hemoglobin: 9.9 g/dL — ABNORMAL LOW (ref 12.0–15.0)
Immature Granulocytes: 0 %
Lymphocytes Relative: 26 %
Lymphs Abs: 1.4 10*3/uL (ref 0.7–4.0)
MCH: 28 pg (ref 26.0–34.0)
MCHC: 30.9 g/dL (ref 30.0–36.0)
MCV: 90.7 fL (ref 80.0–100.0)
Monocytes Absolute: 0.4 10*3/uL (ref 0.1–1.0)
Monocytes Relative: 8 %
Neutro Abs: 3.3 10*3/uL (ref 1.7–7.7)
Neutrophils Relative %: 64 %
Platelets: 376 10*3/uL (ref 150–400)
RBC: 3.53 MIL/uL — ABNORMAL LOW (ref 3.87–5.11)
RDW: 14.1 % (ref 11.5–15.5)
WBC: 5.2 10*3/uL (ref 4.0–10.5)
nRBC: 0 % (ref 0.0–0.2)

## 2021-12-13 LAB — COMPREHENSIVE METABOLIC PANEL WITH GFR
ALT: 12 U/L (ref 0–44)
AST: 16 U/L (ref 15–41)
Albumin: 3.5 g/dL (ref 3.5–5.0)
Alkaline Phosphatase: 101 U/L (ref 38–126)
Anion gap: 8 (ref 5–15)
BUN: 17 mg/dL (ref 8–23)
CO2: 27 mmol/L (ref 22–32)
Calcium: 9.1 mg/dL (ref 8.9–10.3)
Chloride: 100 mmol/L (ref 98–111)
Creatinine, Ser: 0.97 mg/dL (ref 0.44–1.00)
GFR, Estimated: >60 mL/min
Glucose, Bld: 110 mg/dL — ABNORMAL HIGH (ref 70–99)
Potassium: 4 mmol/L (ref 3.5–5.1)
Sodium: 135 mmol/L (ref 135–145)
Total Bilirubin: 0.5 mg/dL (ref 0.3–1.2)
Total Protein: 7.8 g/dL (ref 6.5–8.1)

## 2021-12-13 LAB — FOLATE: Folate: 11.3 ng/mL

## 2021-12-13 LAB — TSH: TSH: 2.455 u[IU]/mL (ref 0.350–4.500)

## 2021-12-13 LAB — IRON AND TIBC
Iron: 43 ug/dL (ref 28–170)
Saturation Ratios: 15 % (ref 10.4–31.8)
TIBC: 290 ug/dL (ref 250–450)
UIBC: 247 ug/dL

## 2021-12-13 LAB — MAGNESIUM: Magnesium: 1.9 mg/dL (ref 1.7–2.4)

## 2021-12-13 LAB — FERRITIN: Ferritin: 94 ng/mL (ref 11–307)

## 2021-12-13 MED ORDER — SODIUM CHLORIDE 0.9% FLUSH
10.0000 mL | INTRAVENOUS | Status: DC | PRN
  Administered 2021-12-13: 13:00:00 10 mL

## 2021-12-13 MED ORDER — DIPHENHYDRAMINE HCL 50 MG/ML IJ SOLN
25.0000 mg | Freq: Once | INTRAMUSCULAR | Status: AC
  Administered 2021-12-13: 11:00:00 25 mg via INTRAVENOUS
  Filled 2021-12-13: qty 1

## 2021-12-13 MED ORDER — SODIUM CHLORIDE 0.9 % IV SOLN
1.0000 mg/kg | Freq: Once | INTRAVENOUS | Status: AC
  Administered 2021-12-13: 12:00:00 90 mg via INTRAVENOUS
  Filled 2021-12-13: qty 18

## 2021-12-13 MED ORDER — FAMOTIDINE IN NACL 20-0.9 MG/50ML-% IV SOLN
20.0000 mg | Freq: Once | INTRAVENOUS | Status: AC
  Administered 2021-12-13: 11:00:00 20 mg via INTRAVENOUS
  Filled 2021-12-13: qty 50

## 2021-12-13 MED ORDER — SODIUM CHLORIDE 0.9 % IV SOLN: Freq: Once | INTRAVENOUS | Status: AC

## 2021-12-13 MED ORDER — HEPARIN SOD (PORK) LOCK FLUSH 100 UNIT/ML IV SOLN
500.0000 [IU] | Freq: Once | INTRAVENOUS | Status: AC | PRN
  Administered 2021-12-13: 13:00:00 500 [IU]

## 2021-12-13 MED ORDER — SODIUM CHLORIDE 0.9 % IV SOLN
240.0000 mg | Freq: Once | INTRAVENOUS | Status: AC
  Administered 2021-12-13: 11:00:00 240 mg via INTRAVENOUS
  Filled 2021-12-13: qty 24

## 2021-12-13 NOTE — Progress Notes (Signed)
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 No pathogenic variants identified on the [facility] Multi-Cancer Panel + RNA. VUS in PMS2 called c.1732C>A and in RECQL4 called c.2967G>A  Identified. The report date is 11/01/2021.   The Multi-Cancer Panel + RNA offered by [facility] includes sequencing and/or deletion duplication testing of the following 84 genes: AIP, ALK, APC, ATM, AXIN2,BAP1,  BARD1, BLM, BMPR1A, BRCA1, BRCA2, BRIP1, CASR, CDC73, CDH1, CDK4, CDKN1B, CDKN1C, CDKN2A (p14ARF), CDKN2A (p16INK4a), CEBPA, CHEK2, CTNNA1, DICER1, DIS3L2, EGFR (c.2369C>T, p.Thr790Met variant only), EPCAM (Deletion/duplication testing only), FH, FLCN, GATA2, GPC3, GREM1 (Promoter region deletion/duplication testing only), HOXB13 (c.251G>A, p.Gly84Glu), HRAS, KIT, MAX, MEN1, MET, MITF (c.952G>A, p.Glu318Lys variant only), MLH1, MSH2, MSH3, MSH6, MUTYH, NBN, NF1, NF2, NTHL1, PALB2, PDGFRA, PHOX2B, PMS2, POLD1, POLE, POT1, PRKAR1A, PTCH1, PTEN, RAD50, RAD51C, RAD51D, RB1, RECQL4, RET, RUNX1, SDHAF2, SDHA (sequence changes only), SDHB, SDHC, SDHD, SMAD4, SMARCA4, SMARCB1, SMARCE1, STK11, SUFU, TERC, TERT, TMEM127, TP53, TSC1, TSC2, VHL, WRN and WT1.      CANCER STAGING:  Cancer Staging  Malignant neoplasm of female breast  [facility]) Staging form: Breast, AJCC 6th Edition - Clinical: Stage I (T1b, N0, M0) - Signed by Baird Cancer, PA on 08/16/2011  Renal cell carcinoma of right kidney [facility]) Staging form: Kidney, AJCC 8th Edition - Clinical stage from 09/26/2021: Stage IV (cT3c, cNX, cM1) - Unsigned   INTERVAL HISTORY:  Deanna Bush, a 61 y.o. female, returns for routine follow-up and consideration for next cycle of chemotherapy. Deanna Bush was last seen on 11/19/2021.  Due for cycle #2 of Nivolumab + Ipilimumab today.   Overall, she tells me she has been feeling pretty well. She reports mild fatigue for 1 day following her last treatment. She denies abdominal pain, n/v/d, loss of appetite, SOB, new pains, and skin rash. She reports dry skin. She is taking iron tablets every other day.   Overall, she feels ready for next cycle of chemo today.   REVIEW OF SYSTEMS:  Review of Systems  Constitutional:  Negative for appetite change and fatigue.  Respiratory:  Negative for shortness of breath.   Gastrointestinal:  Negative for abdominal pain, diarrhea, nausea and vomiting.  Skin:  Negative for rash.       Dry skin  All other systems reviewed and are negative.  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[place])    left    Cellulitis of leg, left    Complication of anesthesia    Hard to wake up   Diabetes mellitus without complication ([place])    Pt denies   Family history of breast cancer 08/16/2011   Family history of breast cancer    Family history of colon cancer    Family history of kidney  cancer    Family history of prostate cancer    Hyperlipidemia    Hypertension    Hypothyroidism    MRSA (methicillin resistant staph aureus) culture positive 08/19/2011   Pre-diabetes    Past Surgical History:  Procedure Laterality Date   BREAST SURGERY Left    mastectomy   CESAREAN SECTION     x2   COLONOSCOPY N/A 03/03/2020   Procedure: COLONOSCOPY;   Surgeon: Daneil Dolin, MD;  Location: [facility];  Service: Endoscopy;  Laterality: N/A;  12:00   IR IMAGING GUIDED PORT INSERTION  11/07/2021   left mastectomy     NEPHRECTOMY Right 02/06/2021   Procedure: NEPHRECTOMY- open radical;  Surgeon: Cleon Gustin, MD;  Location: [facility] ORS;  Service: Urology;  Laterality: Right;   POLYPECTOMY  03/03/2020   Procedure: POLYPECTOMY;  Surgeon: Daneil Dolin, MD;  Location: [facility];  Service: Endoscopy;;    SOCIAL HISTORY:  Social History   Socioeconomic History   Marital status: Married    Spouse name: Not on file   Number of children: 2   Years of education: Not on file   Highest education level: Not on file  Occupational History   Occupation: CNA  Tobacco Use   Smoking status: Former    Packs/day: 0.50    Years: 18.00    Pack years: 9.00    Types: Cigarettes    Quit date: 07/01/2002    Years since quitting: 19.4   Smokeless tobacco: Never  Vaping Use   Vaping Use: Never used  Substance and Sexual Activity   Alcohol use: No   Drug use: No   Sexual activity: Not Currently  Other Topics Concern   Not on file  Social History Narrative   Not on file   Social Determinants of Health   Financial Resource Strain: Not on file  Food Insecurity: Not on file  Transportation Needs: Not on file  Physical Activity: Not on file  Stress: Not on file  Social Connections: Not on file  Intimate Partner Violence: Not on file    FAMILY HISTORY:  Family History  Problem Relation Age of Onset   Hypertension Mother    Diabetes Mother    Hyperlipidemia Mother    Colon cancer Maternal Aunt        dx 25s   Prostate cancer Maternal Uncle    Throat cancer Maternal Uncle    Kidney cancer Maternal Grandfather    Lung cancer Half-Sister    Kidney cancer Half-Sister 12   Breast cancer Half-Sister 37   Breast cancer Half-Sister 64    CURRENT MEDICATIONS:  Current Outpatient Medications  Medication Sig Dispense Refill    acetaminophen (TYLENOL) 500 MG tablet Take 500 mg by mouth every 6 (six) hours as needed for moderate pain or headache.     amLODipine (NORVASC) 5 MG tablet TAKE 1 TABLET (5 MG TOTAL) BY MOUTH DAILY. 90 tablet 3   atorvastatin (LIPITOR) 10 MG tablet TAKE ONE TABLET BY MOUTH ONCE DAILY 90 tablet 3   EPINEPHrine 0.3 mg/0.3 mL IJ SOAJ injection Inject 0.3 mg into the muscle as needed for anaphylaxis. 2 each 0   Ipilimumab (YERVOY IV) Inject into the vein. Every 21 days x 4 cycles     Iron, Ferrous Sulfate, 325 (65 Fe) MG TABS Take 325 mg by mouth daily. 30 tablet 3   levothyroxine (SYNTHROID) 88 MCG tablet TAKE 1 TABLET(88 MCG) BY MOUTH DAILY BEFORE AND BREAKFAST 90 tablet 1  lidocaine-prilocaine (EMLA) cream Apply to affected area once 30 g 3   Multiple Vitamin (MULTIVITAMIN WITH MINERALS) TABS tablet Take 1 tablet by mouth daily.     Nivolumab (OPDIVO IV) Inject into the vein every 21 ( twenty-one) days. Every 21 days x 4 cycles then every 28 days     UNABLE TO FIND MASTECTOMY BRA AND PROTHESIS DX Z90.12 6 each 2   UNABLE TO FIND 6 mastectomy prosthesis and bras. 6 each 0   No current facility-administered medications for this visit.    ALLERGIES:  Allergies  Allergen Reactions   Sulfonamide Derivatives Itching   Yellow Jacket Venom [Bee Venom] Rash    urticaria    PHYSICAL EXAM:  Performance status (ECOG): 0 - Asymptomatic  There were no vitals filed for this visit. Wt Readings from Last 3 Encounters:  12/13/21 192 lb 9.6 oz (87.4 kg)  12/07/21 191 lb (86.6 kg)  11/19/21 193 lb 3.2 oz (87.6 kg)   Physical Exam Vitals reviewed.  Constitutional:      Appearance: Normal appearance. She is obese.  Cardiovascular:     Rate and Rhythm: Normal rate and regular rhythm.     Pulses: Normal pulses.     Heart sounds: Normal heart sounds.  Pulmonary:     Effort: Pulmonary effort is normal.     Breath sounds: Normal breath sounds.  Musculoskeletal:     Right lower leg: No edema.      Left lower leg: No edema.  Neurological:     General: No focal deficit present.     Mental Status: She is alert and oriented to person, place, and time.  Psychiatric:        Mood and Affect: Mood normal.        Behavior: Behavior normal.    LABORATORY DATA:  I have reviewed the labs as listed.  CBC Latest Ref Rng & Units 12/13/2021 11/19/2021 10/22/2021  WBC 4.0 - 10.5 K/uL 5.2 5.7 6.0  Hemoglobin 12.0 - 15.0 g/dL 9.9(L) 10.0(L) 10.2(L)  Hematocrit 36.0 - 46.0 % 32.0(L) 32.0(L) 32.2(L)  Platelets 150 - 400 K/uL 376 346 317   CMP Latest Ref Rng & Units 12/13/2021 11/19/2021 09/27/2021  Glucose 70 - 99 mg/dL 110(H) 121(H) 121(H)  BUN 8 - 23 mg/dL 17 15 15   Creatinine 0.44 - 1.00 mg/dL 0.97 0.97 0.91  Sodium 135 - 145 mmol/L 135 135 136  Potassium 3.5 - 5.1 mmol/L 4.0 3.9 4.1  Chloride 98 - 111 mmol/L 100 102 104  CO2 22 - 32 mmol/L 27 25 25   Calcium 8.9 - 10.3 mg/dL 9.1 9.3 9.4  Total Protein 6.5 - 8.1 g/dL 7.8 7.4 7.9  Total Bilirubin 0.3 - 1.2 mg/dL 0.5 0.2(L) 0.3  Alkaline Phos 38 - 126 U/L 101 97 96  AST 15 - 41 U/L 16 15 15   ALT 0 - 44 U/L 12 10 11     DIAGNOSTIC IMAGING:  I have independently reviewed the scans and discussed with the patient. No results found.   ASSESSMENT:  Metastatic clear-cell renal cell carcinoma: - Right radical nephrectomy on 02/06/2021. - Pathology shows clear-cell RCC, grade 4, 12.5 cm, necrosis and rhabdoid features are identified.  Tumor extends into the and invades the wall of the vena cava (pT3c).  Vascular, ureteral and all margins of resection are negative for tumor. - CT scan abdomen with and without contrast on 05/29/2021 showed several nodules along the peritoneal surface of the right nephrectomy bed warranting close attention. - CTAP with  and without contrast on 09/18/2021 showed progressive nodularity within the right nephrectomy bed, with enlarged nodules adjacent to the right hepatic lobe extending inferiorly along the right retroperitoneum  with multiple enlarged enhancing nodules.  Direct metastatic invasion into the right hepatic lobe.  Small nodule at the right lung base favored to be benign. - IMDC: Intermediate risk group with 2 features including diagnosis less than 1 year and hemoglobin lower than normal. - Bone scan on 10/05/2021 with focal activity in the mental vertex of the mandible which could be inflammatory/traumatic/metastatic. - CT chest on 10/03/2021 showed several lung nodules scattered bilaterally some of which could be metastatic and many others could be due to mucoid impaction. - NGS testing did not show any targetable mutations.  TMB was low.  CCND3 amplified.  PBRM1 VUS present.    Social/family history: - She lives at home with her husband and also takes care of her mother. - She works as a Buyer, retail at [employer].  She quit smoking in 2004 and smoked half pack per day for 10 years. - Half sister (same mother) had kidney cancer at age 54.  Maternal grandfather had kidney cancer.  Sister died of lung cancer.  2 half sisters (same father) had breast cancers.  3.  Left breast stage I tubular adenocarcinoma: - She underwent left mastectomy on 06/11/2002.  0/6 lymph nodes positive.  ER 90%, PR 92%, Ki-67 6%, HER2 negative.  As per chart, declined antiestrogen therapy.   PLAN:  Metastatic clear-cell RCC, intermediate risk by IMDC: - She has not experienced any immunotherapy related side effects after first cycle.  She felt slightly tired.  However she continued to work full-time. - Reviewed labs today which showed anemia on CBC.  LFTs and creatinine were normal. - Proceed with cycle 2 without any dose modifications.  Closely monitor TSH as she has Graves' disease. - RTC 3 weeks for follow-up.  2.  Normocytic anemia: - Ferritin is 94, percent saturation is 15.  E07 and folic acid normal.  MMA pending.  Hemoglobin 9.9. - She is taking iron tablet every other day.  She has some constipation which is  manageable. - She will increase iron tablet to daily.  If she cannot tolerate and her iron shows no improvement, will consider parenteral iron therapy.   Orders placed this encounter:  No orders of the defined types were placed in this encounter.    Derek Jack, MD [place] [phone]   I, Thana Ates, am acting as a scribe for Dr. Derek Jack.  I, Derek Jack MD, have reviewed the above documentation for accuracy and completeness, and I agree with the above.

## 2021-12-13 NOTE — Progress Notes (Signed)
Patient presents today for treatment and follow up visit with Dr. Delton Coombes. C2 today. MAR reviewed and updated. Labs pending. Patient has no complaints of any side effects from her first treatment. No complaints at this time. Consent and attestation complete.  ? ?Message received from A. Ouida Sills RN / Dr. Delton Coombes to proceed with treatment. Labs reviewed and within parameters for tx.  ? ?Ipilimumab (YERVOY) Patient Monitoring Assessment  ? ?Is the patient experiencing any of the following general symptoms?:  ?'[]'$ Difficulty performing normal activities ?'[]'$ Feeling sluggish or cold all the time ?'[]'$ Unusual weight gain ?'[]'$ Constant or unusual headaches ?'[]'$ Feeling dizzy or faint ?'[]'$ Changes in eyesight (blurry vision, double vision, or other vision problems) ?'[]'$ Changes in mood or behavior (ex: decreased sex drive, irritability, or forgetfulness) ?'[]'$ Starting new medications (ex: steroids, other medications that lower immune response) ?'[]'$ Patient is not experiencing any of the general symptoms above.  ? ? ?Gastrointestinal  ?Patient is having 1 bowel movements each day.  ?Is this different from baseline? '[]'$ Yes '[x]'$ No ?Are your stools watery or do they have a foul smell? '[]'$ Yes '[x]'$ No ?Have you seen blood in your stools? '[]'$ Yes '[x]'$ No ?Are your stools dark, tarry, or sticky? '[]'$ Yes '[x]'$ No ?Are you having pain or tenderness in your belly? '[]'$ Yes '[x]'$ No ? ?Skin ?Does your skin itch? '[]'$ Yes '[x]'$ No ?Do you have a rash? '[]'$ Yes '[x]'$ No ?Has your skin blistered and/or peeled? '[]'$ Yes '[x]'$ No ?Do you have sores in your mouth? '[]'$ Yes '[x]'$ No ? ?Hepatic ?Has your urine been dark or tea colored? '[]'$ Yes '[x]'$ No ?Have you noticed that your skin or the whites of your eyes are turning yellow? '[]'$ Yes '[x]'$ No ?Are you bleeding or bruising more easily than normal? '[]'$ Yes '[x]'$ No ?Are you nauseous and/or vomiting? '[]'$ Yes '[x]'$ No ?Do you have pain on the right side of your stomach? '[]'$ Yes '[x]'$ No ? ?Neurologic  ?Are you having unusual weakness of legs, arms, or face? '[]'$ Yes  '[x]'$ No ?Are you having numbness or tingling in your hands or feet? '[]'$ Yes '[x]'$ No ? ?Deanna Bush  ? ?Treatment given today per MD orders. Tolerated infusion without adverse affects. Vital signs stable. No complaints at this time. Discharged from clinic ambulatory in stable condition. Alert and oriented x 3. F/U with [facility] as scheduled.   ?

## 2021-12-13 NOTE — Patient Instructions (Signed)
[facility] at [facility] Discharge Instructions   You were seen and examined today by Dr. Katragadda.  He reviewed the results of your lab work which are normal/stable.   We will proceed with your treatment today.  Return as scheduled.    Thank you for choosing [facility] at [facility] to provide your oncology and hematology care.  To afford each patient quality time with our provider, please arrive at least 15 minutes before your scheduled appointment time.   If you have a lab appointment with the Cancer Center please come in thru the Main Entrance and check in at the main information desk.  You need to re-schedule your appointment should you arrive 10 or more minutes late.  We strive to give you quality time with our providers, and arriving late affects you and other patients whose appointments are after yours.  Also, if you no show three or more times for appointments you may be dismissed from the clinic at the providers discretion.     Again, thank you for choosing [facility].  Our hope is that these requests will decrease the amount of time that you wait before being seen by our physicians.       _____________________________________________________________  Should you have questions after your visit to [facility], please contact our office at [phone] and follow the prompts.  Our office hours are 8:00 a.m. and 4:30 p.m. Monday - Friday.  Please note that voicemails left after 4:00 p.m. may not be returned until the following business day.  We are closed weekends and major holidays.  You do have access to a nurse 24-7, just call the main number to the clinic [phone] and do not press any options, hold on the line and a nurse will answer the phone.    For prescription refill requests, have your pharmacy contact our office and allow 72 hours.    Due to Covid, you will need to wear a mask upon entering  the hospital. If you do not have a mask, a mask will be given to you at the Main Entrance upon arrival. For doctor visits, patients may have 1 support person age 18 or older with them. For treatment visits, patients can not have anyone with them due to social distancing guidelines and our immunocompromised population.      

## 2021-12-13 NOTE — Progress Notes (Signed)
Patient has been examined by Dr. Katragadda, and vital signs and labs have been reviewed. ANC, Creatinine, LFTs, hemoglobin, and platelets are within treatment parameters per M.D. - pt may proceed with treatment.    °

## 2021-12-13 NOTE — Progress Notes (Signed)
.  yervo ?

## 2021-12-13 NOTE — Patient Instructions (Signed)
[place]  Discharge Instructions: ?Thank you for choosing [place] to provide your oncology and hematology care.  ?If you have a lab appointment with the [facility], please come in thru the Main Entrance and check in at the main information desk. ? ?Wear comfortable clothing and clothing appropriate for easy access to any Portacath or PICC line.  ? ?We strive to give you quality time with your provider. You may need to reschedule your appointment if you arrive late (15 or more minutes).  Arriving late affects you and other patients whose appointments are after yours.  Also, if you miss three or more appointments without notifying the office, you may be dismissed from the clinic at the provider?s discretion.    ?  ?For prescription refill requests, have your pharmacy contact our office and allow 72 hours for refills to be completed.   ? ?Today you received the following chemotherapy and/or immunotherapy agents Opdivo/Yervoy.     ?  ?To help prevent nausea and vomiting after your treatment, we encourage you to take your nausea medication as directed. ? ?BELOW ARE SYMPTOMS THAT SHOULD BE REPORTED IMMEDIATELY: ?*FEVER GREATER THAN 100.4 F (38 ?C) OR HIGHER ?*CHILLS OR SWEATING ?*NAUSEA AND VOMITING THAT IS NOT CONTROLLED WITH YOUR NAUSEA MEDICATION ?*UNUSUAL SHORTNESS OF BREATH ?*UNUSUAL BRUISING OR BLEEDING ?*URINARY PROBLEMS (pain or burning when urinating, or frequent urination) ?*BOWEL PROBLEMS (unusual diarrhea, constipation, pain near the anus) ?TENDERNESS IN MOUTH AND THROAT WITH OR WITHOUT PRESENCE OF ULCERS (sore throat, sores in mouth, or a toothache) ?UNUSUAL RASH, SWELLING OR PAIN  ?UNUSUAL VAGINAL DISCHARGE OR ITCHING  ? ?Items with * indicate a potential emergency and should be followed up as soon as possible or go to the Emergency Department if any problems should occur. ? ?Please show the CHEMOTHERAPY ALERT CARD or IMMUNOTHERAPY ALERT CARD at check-in to the Emergency  Department and triage nurse. ? ?Should you have questions after your visit or need to cancel or reschedule your appointment, please contact [facility] [phone]  and follow the prompts.  Office hours are 8:00 a.m. to 4:30 p.m. Monday - Friday. Please note that voicemails left after 4:00 p.m. may not be returned until the following business day.  We are closed weekends and major holidays. You have access to a nurse at all times for urgent questions. Please call the main number to the clinic [phone] and follow the prompts. ? ?For any non-urgent questions, you may also contact your provider using MyChart. We now offer e-Visits for anyone 110 and older to request care online for non-urgent symptoms. For details visit [email or web address]. ?  ?Also download the MyChart app! Go to the app store, search "MyChart", open the app, select [facility], and log in with your MyChart username and password. ? ?Due to Covid, a mask is required upon entering the hospital/clinic. If you do not have a mask, one will be given to you upon arrival. For doctor visits, patients may have 1 support person aged 2 or older with them. For treatment visits, patients cannot have anyone with them due to current Covid guidelines and our immunocompromised population.  ?

## 2021-12-15 ENCOUNTER — Encounter: Payer: Self-pay | Admitting: Family Medicine

## 2021-12-15 NOTE — Progress Notes (Signed)
? ?  STEFHANIE KACHMAR     MRN: [id number]      DOB: Oct 09, 1960 ? ? ?HPI ?Ms. Marlar is here for follow up and re-evaluation of chronic medical conditions, medication management and review of any available recent lab and radiology data.  ?Preventive health is updated, specifically  Cancer screening and Immunization.   ?Diagnosed with liver cancer felt to be meastasized from her renal cell cancer diagnosed approx 1 year ago , has started chemo, no major untoward s/e a this time, mild  fatigue reported. Mentally and emotionally reports support esp from one of her siblings, no interest in therapy or medication currently ? ?ROS ?Denies recent fever or chills. ?Denies sinus pressure, nasal congestion, ear pain or sore throat. ?Denies chest congestion, productive cough or wheezing. ?Denies chest pains, palpitations and leg swelling ?Denies abdominal pain, nausea, vomiting,diarrhea or constipation.   ?Denies dysuria, frequency, hesitancy or incontinence. ?Denies joint pain, swelling and limitation in mobility. ?Denies headaches, seizures, numbness, or tingling. ?Denies depression, anxiety or insomnia. ?Denies skin break down or rash. ? ? ?PE ? ?BP 121/71   Pulse 74   Ht '5\' 6"'$  (1.676 m)   Wt 191 lb (86.6 kg)   SpO2 95%   BMI 30.83 kg/m?  ? ?Patient alert and oriented and in no cardiopulmonary distress. ? ?HEENT: No facial asymmetry, EOMI,     Neck supple . ? ?Chest: Clear to auscultation bilaterally. ? ?CVS: S1, S2 no murmurs, no S3.Regular rate. ? ?ABD: Soft non tender.  ? ?Ext: No edema ? ?MS: Adequate ROM spine, shoulders, hips and knees. ? ?Skin: Intact, no ulcerations or rash noted. ? ?Psych: Good eye contact, normal affect. Memory intact not anxious or depressed appearing. ? ?CNS: CN 2-12 intact, power,  normal throughout.no focal deficits noted. ? ? ?Assessment & Plan ? ?Primary hypertension ?Controlled, no change in medication ?DASH diet and commitment to daily physical activity for a minimum of 30 minutes discussed and  encouraged, as a part of hypertension management. ?The importance of attaining a healthy weight is also discussed. ? ?BP/Weight 12/13/2021 12/13/2021 12/07/2021 11/19/2021 11/19/2021 11/07/2021 10/31/2021  ?Systolic BP 500 370 488 891 122 114 136  ?Diastolic BP 74 69 71 52 58 61 67  ?Wt. (Lbs) - 192.6 191 - 193.2 195 -  ?BMI - 31.09 30.83 - 31.18 31.47 -  ? ? ? ? ? ?Mixed hyperlipidemia ?Hyperlipidemia:Low fat diet discussed and encouraged. ? ? ?Lipid Panel  ?Lab Results  ?Component Value Date  ? CHOL 175 07/10/2021  ? HDL 60 07/10/2021  ? LDLCALC 101 (H) 07/10/2021  ? TRIG 76 07/10/2021  ? CHOLHDL 2.9 07/10/2021  ? ?Updated lab needed at/ before next visit. ? ? ? ? ?Hypothyroidism ?Controlled, no change in medication ? ? ?Renal cell cancer ([place]) ?Diagnosed with metastatic disease in 12/20212, currently undergoing chemo and tolerating this well to date, continues to work full time ? ?

## 2021-12-16 LAB — METHYLMALONIC ACID, SERUM: Methylmalonic Acid, Quantitative: 147 nmol/L (ref 0–378)

## 2021-12-17 ENCOUNTER — Encounter (HOSPITAL_COMMUNITY): Payer: Self-pay | Admitting: Emergency Medicine

## 2021-12-17 ENCOUNTER — Encounter (HOSPITAL_COMMUNITY): Payer: Self-pay | Admitting: Hematology

## 2021-12-17 NOTE — Progress Notes (Signed)
Completed the cancer annual care benefit claim.  Pt will pick up from the front desk.  While she was on the phone she noted that her face and eyes have been swollen and rash around her neck.  She had taken a benadryl about 5 minutes prior and explained that she could also take Claritin or zyrtec.  Verbalized understanding and will call if it gets worse or starts having shortness of breath. ?

## 2021-12-17 NOTE — Assessment & Plan Note (Signed)
Hyperlipidemia:Low fat diet discussed and encouraged.   Lipid Panel  Lab Results  Component Value Date   CHOL 175 07/10/2021   HDL 60 07/10/2021   LDLCALC 101 (H) 07/10/2021   TRIG 76 07/10/2021   CHOLHDL 2.9 07/10/2021     Updated lab needed at/ before next visit.  

## 2021-12-17 NOTE — Assessment & Plan Note (Signed)
Diagnosed with metastatic disease in 12/20212, currently undergoing chemo and tolerating this well to date, continues to work full time ?

## 2021-12-17 NOTE — Assessment & Plan Note (Signed)
Controlled, no change in medication ?DASH diet and commitment to daily physical activity for a minimum of 30 minutes discussed and encouraged, as a part of hypertension management. ?The importance of attaining a healthy weight is also discussed. ? ?BP/Weight 12/13/2021 12/13/2021 12/07/2021 11/19/2021 11/19/2021 11/07/2021 10/31/2021  ?Systolic BP 947 076 151 834 122 114 136  ?Diastolic BP 74 69 71 52 58 61 67  ?Wt. (Lbs) - 192.6 191 - 193.2 195 -  ?BMI - 31.09 30.83 - 31.18 31.47 -  ? ? ? ? ?

## 2021-12-17 NOTE — Assessment & Plan Note (Signed)
Controlled, no change in medication  

## 2022-01-03 ENCOUNTER — Other Ambulatory Visit: Payer: Self-pay | Admitting: "Endocrinology

## 2022-01-03 ENCOUNTER — Inpatient Hospital Stay (HOSPITAL_BASED_OUTPATIENT_CLINIC_OR_DEPARTMENT_OTHER): Payer: PRIVATE HEALTH INSURANCE | Admitting: Hematology

## 2022-01-03 ENCOUNTER — Inpatient Hospital Stay (HOSPITAL_COMMUNITY): Payer: PRIVATE HEALTH INSURANCE

## 2022-01-03 ENCOUNTER — Other Ambulatory Visit: Payer: Self-pay | Admitting: Family Medicine

## 2022-01-03 VITALS — BP 123/70 | HR 59 | Temp 97.9°F | Resp 18

## 2022-01-03 DIAGNOSIS — C50912 Malignant neoplasm of unspecified site of left female breast: Secondary | ICD-10-CM

## 2022-01-03 DIAGNOSIS — C641 Malignant neoplasm of right kidney, except renal pelvis: Secondary | ICD-10-CM

## 2022-01-03 DIAGNOSIS — Z5112 Encounter for antineoplastic immunotherapy: Secondary | ICD-10-CM | POA: Diagnosis not present

## 2022-01-03 DIAGNOSIS — E782 Mixed hyperlipidemia: Secondary | ICD-10-CM

## 2022-01-03 DIAGNOSIS — E890 Postprocedural hypothyroidism: Secondary | ICD-10-CM

## 2022-01-03 LAB — CBC WITH DIFFERENTIAL/PLATELET
Abs Immature Granulocytes: 0.01 10*3/uL (ref 0.00–0.07)
Basophils Absolute: 0 10*3/uL (ref 0.0–0.1)
Basophils Relative: 0 %
Eosinophils Absolute: 0.2 10*3/uL (ref 0.0–0.5)
Eosinophils Relative: 3 %
HCT: 30.2 % — ABNORMAL LOW (ref 36.0–46.0)
Hemoglobin: 9.5 g/dL — ABNORMAL LOW (ref 12.0–15.0)
Immature Granulocytes: 0 %
Lymphocytes Relative: 24 %
Lymphs Abs: 1.5 10*3/uL (ref 0.7–4.0)
MCH: 28.3 pg (ref 26.0–34.0)
MCHC: 31.5 g/dL (ref 30.0–36.0)
MCV: 89.9 fL (ref 80.0–100.0)
Monocytes Absolute: 0.5 10*3/uL (ref 0.1–1.0)
Monocytes Relative: 9 %
Neutro Abs: 3.8 10*3/uL (ref 1.7–7.7)
Neutrophils Relative %: 64 %
Platelets: 393 10*3/uL (ref 150–400)
RBC: 3.36 MIL/uL — ABNORMAL LOW (ref 3.87–5.11)
RDW: 14.1 % (ref 11.5–15.5)
WBC: 6 10*3/uL (ref 4.0–10.5)
nRBC: 0 % (ref 0.0–0.2)

## 2022-01-03 LAB — COMPREHENSIVE METABOLIC PANEL
ALT: 12 U/L (ref 0–44)
AST: 18 U/L (ref 15–41)
Albumin: 3.4 g/dL — ABNORMAL LOW (ref 3.5–5.0)
Alkaline Phosphatase: 105 U/L (ref 38–126)
Anion gap: 8 (ref 5–15)
BUN: 18 mg/dL (ref 8–23)
CO2: 26 mmol/L (ref 22–32)
Calcium: 9.2 mg/dL (ref 8.9–10.3)
Chloride: 99 mmol/L (ref 98–111)
Creatinine, Ser: 0.99 mg/dL (ref 0.44–1.00)
GFR, Estimated: >60 mL/min (ref >60)
Glucose, Bld: 104 mg/dL — ABNORMAL HIGH (ref 70–99)
Potassium: 4.1 mmol/L (ref 3.5–5.1)
Sodium: 133 mmol/L — ABNORMAL LOW (ref 135–145)
Total Bilirubin: 0.5 mg/dL (ref 0.3–1.2)
Total Protein: 7.7 g/dL (ref 6.5–8.1)

## 2022-01-03 LAB — TSH: TSH: 3.706 u[IU]/mL (ref 0.350–4.500)

## 2022-01-03 LAB — MAGNESIUM: Magnesium: 2 mg/dL (ref 1.7–2.4)

## 2022-01-03 MED ORDER — SODIUM CHLORIDE 0.9 % IV SOLN
1.0000 mg/kg | Freq: Once | INTRAVENOUS | Status: AC
  Administered 2022-01-03: 90 mg via INTRAVENOUS
  Filled 2022-01-03: qty 18

## 2022-01-03 MED ORDER — HEPARIN SOD (PORK) LOCK FLUSH 100 UNIT/ML IV SOLN
500.0000 [IU] | Freq: Once | INTRAVENOUS | Status: AC | PRN
  Administered 2022-01-03: 500 [IU]

## 2022-01-03 MED ORDER — DIPHENHYDRAMINE HCL 50 MG/ML IJ SOLN
25.0000 mg | Freq: Once | INTRAMUSCULAR | Status: AC
  Administered 2022-01-03: 25 mg via INTRAVENOUS
  Filled 2022-01-03: qty 1

## 2022-01-03 MED ORDER — SODIUM CHLORIDE 0.9 % IV SOLN
240.0000 mg | Freq: Once | INTRAVENOUS | Status: AC
  Administered 2022-01-03: 240 mg via INTRAVENOUS
  Filled 2022-01-03: qty 20

## 2022-01-03 MED ORDER — FAMOTIDINE IN NACL 20-0.9 MG/50ML-% IV SOLN
20.0000 mg | Freq: Once | INTRAVENOUS | Status: AC
  Administered 2022-01-03: 20 mg via INTRAVENOUS
  Filled 2022-01-03: qty 50

## 2022-01-03 MED ORDER — SODIUM CHLORIDE 0.9 % IV SOLN: Freq: Once | INTRAVENOUS | Status: AC

## 2022-01-03 MED ORDER — SODIUM CHLORIDE 0.9% FLUSH
10.0000 mL | INTRAVENOUS | Status: DC | PRN
  Administered 2022-01-03: 10 mL

## 2022-01-03 NOTE — Progress Notes (Signed)
Ipilimumab (YERVOY) Patient Monitoring Assessment  ? ?Is the patient experiencing any of the following general symptoms?:  ?'[]'$ Difficulty performing normal activities ?'[]'$ Feeling sluggish or cold all the time ?'[]'$ Unusual weight gain ?'[]'$ Constant or unusual headaches ?'[]'$ Feeling dizzy or faint ?'[]'$ Changes in eyesight (blurry vision, double vision, or other vision problems) ?'[]'$ Changes in mood or behavior (ex: decreased sex drive, irritability, or forgetfulness) ?'[]'$ Starting new medications (ex: steroids, other medications that lower immune response) ?'[x]'$ Patient is not experiencing any of the general symptoms above.  ? ? ?Gastrointestinal  ?Patient is having 2 bowel movements each day.  ?Is this different from baseline? '[]'$ Yes '[x]'$ No ?Are your stools watery or do they have a foul smell? '[]'$ Yes '[x]'$ No ?Have you seen blood in your stools? '[]'$ Yes '[x]'$ No ?Are your stools dark, tarry, or sticky? '[]'$ Yes '[x]'$ No ?Are you having pain or tenderness in your belly? '[]'$ Yes '[x]'$ No ? ?Skin ?Does your skin itch? '[]'$ Yes '[x]'$ No ?Do you have a rash? '[]'$ Yes '[x]'$ No ?Has your skin blistered and/or peeled? '[]'$ Yes '[x]'$ No ?Do you have sores in your mouth? '[]'$ Yes '[x]'$ No ? ?Hepatic ?Has your urine been dark or tea colored? '[]'$ Yes '[x]'$ No ?Have you noticed that your skin or the whites of your eyes are turning yellow? '[]'$ Yes '[x]'$ No ?Are you bleeding or bruising more easily than normal? '[]'$ Yes '[x]'$ No ?Are you nauseous and/or vomiting? '[]'$ Yes '[x]'$ No ?Do you have pain on the right side of your stomach? '[]'$ Yes '[x]'$ No ? ?Neurologic  ?Are you having unusual weakness of legs, arms, or face? '[]'$ Yes '[x]'$ No ?Are you having numbness or tingling in your hands or feet? '[]'$ Yes '[x]'$ No ? ?Tally Due  ?

## 2022-01-03 NOTE — Patient Instructions (Signed)
[facility]  Discharge Instructions: Thank you for choosing [facility] to provide your oncology and hematology care.  If you have a lab appointment with the Cancer Center, please come in thru the Main Entrance and check in at the main information desk.  Wear comfortable clothing and clothing appropriate for easy access to any Portacath or PICC line.   We strive to give you quality time with your provider. You may need to reschedule your appointment if you arrive late (15 or more minutes).  Arriving late affects you and other patients whose appointments are after yours.  Also, if you miss three or more appointments without notifying the office, you may be dismissed from the clinic at the provider's discretion.      For prescription refill requests, have your pharmacy contact our office and allow 72 hours for refills to be completed.        To help prevent nausea and vomiting after your treatment, we encourage you to take your nausea medication as directed.  BELOW ARE SYMPTOMS THAT SHOULD BE REPORTED IMMEDIATELY: *FEVER GREATER THAN 100.4 F (38 C) OR HIGHER *CHILLS OR SWEATING *NAUSEA AND VOMITING THAT IS NOT CONTROLLED WITH YOUR NAUSEA MEDICATION *UNUSUAL SHORTNESS OF BREATH *UNUSUAL BRUISING OR BLEEDING *URINARY PROBLEMS (pain or burning when urinating, or frequent urination) *BOWEL PROBLEMS (unusual diarrhea, constipation, pain near the anus) TENDERNESS IN MOUTH AND THROAT WITH OR WITHOUT PRESENCE OF ULCERS (sore throat, sores in mouth, or a toothache) UNUSUAL RASH, SWELLING OR PAIN  UNUSUAL VAGINAL DISCHARGE OR ITCHING   Items with * indicate a potential emergency and should be followed up as soon as possible or go to the Emergency Department if any problems should occur.  Please show the CHEMOTHERAPY ALERT CARD or IMMUNOTHERAPY ALERT CARD at check-in to the Emergency Department and triage nurse.  Should you have questions after your visit or need to cancel  or reschedule your appointment, please contact [facility] [phone]  and follow the prompts.  Office hours are 8:00 a.m. to 4:30 p.m. Monday - Friday. Please note that voicemails left after 4:00 p.m. may not be returned until the following business day.  We are closed weekends and major holidays. You have access to a nurse at all times for urgent questions. Please call the main number to the clinic [phone] and follow the prompts.  For any non-urgent questions, you may also contact your provider using MyChart. We now offer e-Visits for anyone 18 and older to request care online for non-urgent symptoms. For details visit [email or web address].   Also download the MyChart app! Go to the app store, search "MyChart", open the app, select [facility], and log in with your MyChart username and password.  Due to Covid, a mask is required upon entering the hospital/clinic. If you do not have a mask, one will be given to you upon arrival. For doctor visits, patients may have 1 support person aged 18 or older with them. For treatment visits, patients cannot have anyone with them due to current Covid guidelines and our immunocompromised population.  

## 2022-01-03 NOTE — Patient Instructions (Signed)
[facility] at [facility] Discharge Instructions   You were seen and examined today by Dr. Katragadda.  He reviewed the results of your lab work which are normal/stable.   We will proceed with your treatment today.  Return as scheduled.    Thank you for choosing [facility] at [facility] to provide your oncology and hematology care.  To afford each patient quality time with our provider, please arrive at least 15 minutes before your scheduled appointment time.   If you have a lab appointment with the Cancer Center please come in thru the Main Entrance and check in at the main information desk.  You need to re-schedule your appointment should you arrive 10 or more minutes late.  We strive to give you quality time with our providers, and arriving late affects you and other patients whose appointments are after yours.  Also, if you no show three or more times for appointments you may be dismissed from the clinic at the providers discretion.     Again, thank you for choosing [facility].  Our hope is that these requests will decrease the amount of time that you wait before being seen by our physicians.       _____________________________________________________________  Should you have questions after your visit to [facility], please contact our office at [phone] and follow the prompts.  Our office hours are 8:00 a.m. and 4:30 p.m. Monday - Friday.  Please note that voicemails left after 4:00 p.m. may not be returned until the following business day.  We are closed weekends and major holidays.  You do have access to a nurse 24-7, just call the main number to the clinic [phone] and do not press any options, hold on the line and a nurse will answer the phone.    For prescription refill requests, have your pharmacy contact our office and allow 72 hours.    Due to Covid, you will need to wear a mask upon entering  the hospital. If you do not have a mask, a mask will be given to you at the Main Entrance upon arrival. For doctor visits, patients may have 1 support person age 18 or older with them. For treatment visits, patients can not have anyone with them due to social distancing guidelines and our immunocompromised population.      

## 2022-01-03 NOTE — Progress Notes (Signed)
Patient presents today for chemotherapy infusion.  Patient is in satisfactory condition with no complaints voiced.  Vital signs are stable.  Labs reviewed by Dr. Katragadda during her office visit.  All labs are within treatment parameters.  We will proceed with treatment per MD orders.    Patient tolerated treatment well with no complaints voiced.  Patient left ambulatory in stable condition.  Vital signs stable at discharge.  Follow up as scheduled.    

## 2022-01-03 NOTE — Progress Notes (Signed)
? ?[facility] ?[address] ?[place], [place] [address] ? ? ?CLINIC:  ?Medical Oncology/Hematology ? ?PCP:  ?Fayrene Helper, MD ?[address] [place] / [place] [address] ?[phone] ? ? 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 ?PRIOR THERAPY: none ? ?NGS Results: not done ? ?CURRENT THERAPY: Nivolumab + Ipilimumab q21d / Nivolumab q28d ? ?BRIEF ONCOLOGIC HISTORY:  ?Oncology History  ?Renal cell cancer ([place])  ?06/20/2021 Initial Diagnosis  ? Renal cell carcinoma of right kidney [facility]) ?  ?11/19/2021 -  Chemotherapy  ? Patient is on Treatment Plan : RENAL CELL CARCINOMA Nivolumab + Ipilimumab q21d / Nivolumab q28d  ?   ? Genetic Testing  ? Negative genetic testing. No pathogenic variants identified on the [facility] Multi-Cancer Panel + RNA. VUS in PMS2 called c.1732C>A and in RECQL4 called c.2967G>A  Identified. The report date is 11/01/2021.  ? ?The Multi-Cancer Panel + RNA offered by [facility] includes sequencing and/or deletion duplication testing of the following 84 genes: AIP, ALK, APC, ATM, AXIN2,BAP1,  BARD1, BLM, BMPR1A, BRCA1, BRCA2, BRIP1, CASR, CDC73, CDH1, CDK4, CDKN1B, CDKN1C, CDKN2A (p14ARF), CDKN2A (p16INK4a), CEBPA, CHEK2, CTNNA1, DICER1, DIS3L2, EGFR (c.2369C>T, p.Thr790Met variant only), EPCAM (Deletion/duplication testing only), FH, FLCN, GATA2, GPC3, GREM1 (Promoter region deletion/duplication testing only), HOXB13 (c.251G>A, p.Gly84Glu), HRAS, KIT, MAX, MEN1, MET, MITF (c.952G>A, p.Glu318Lys variant only), MLH1, MSH2, MSH3, MSH6, MUTYH, NBN, NF1, NF2, NTHL1, PALB2, PDGFRA, PHOX2B, PMS2, POLD1, POLE, POT1, PRKAR1A, PTCH1, PTEN, RAD50, RAD51C, RAD51D, RB1, RECQL4, RET, RUNX1, SDHAF2, SDHA (sequence changes only), SDHB, SDHC, SDHD, SMAD4, SMARCA4, SMARCB1, SMARCE1, STK11, SUFU, TERC, TERT, TMEM127, TP53, TSC1, TSC2, VHL, WRN and WT1. ? ?  ? ? ?CANCER STAGING: ? 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 ?Renal cell cancer ([place]) ?Staging form: Kidney, AJCC 8th Edition ?- Clinical stage from 09/26/2021: Stage IV (cT3c, cNX, cM1) - Unsigned ? ? ?INTERVAL HISTORY:  ?[place], a 61 y.o. female, returns for routine follow-up and consideration for next cycle of chemotherapy. [place] was last seen on 12/13/2021. ? ?Due for cycle #3 of Nivolumab + Ipilimumab  today.  ? ?Overall, she tells me she has been feeling pretty well. She reports sore pain in right loin below ribs starting 1 week ago which she rates 4 out of 10 which occurs intermittently; she has not required pain medication for this pain. She is taking an iron tablet every other day; she reports when she was taking iron tablets every day she had mild constipation which was manageable with eating prunes. Her energy levels are good.  ? ?Overall, she feels ready for next cycle of chemo today.  ? ?REVIEW OF SYSTEMS:  ?Review of Systems  ?Constitutional:  Negative for appetite change and fatigue.  ?Musculoskeletal:  Positive for back pain (R side) and flank pain (R).  ?Psychiatric/Behavioral:  Positive for sleep disturbance.   ?All other systems reviewed and are negative. ? ?PAST MEDICAL/SURGICAL HISTORY:  ?Past Medical History:  ?Diagnosis Date  ? 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 left   ? Cellulitis of leg, left   ? Complication of anesthesia   ? Hard to wake up  ? Diabetes mellitus without complication ([place])   ? Pt denies  ? Family history of breast cancer 08/16/2011  ? Family history of breast cancer   ? Family  history of colon cancer   ? Family history of kidney cancer   ? Family history of prostate cancer   ? Hyperlipidemia   ? Hypertension   ? Hypothyroidism   ? MRSA (methicillin resistant staph aureus) culture positive 08/19/2011  ? Pre-diabetes   ? ?Past Surgical History:  ?Procedure Laterality Date  ? BREAST SURGERY Left   ? mastectomy  ? CESAREAN  SECTION    ? x2  ? COLONOSCOPY N/A 03/03/2020  ? Procedure: COLONOSCOPY;  Surgeon: Daneil Dolin, MD;  Location: [facility];  Service: Endoscopy;  Laterality: N/A;  12:00  ? IR IMAGING GUIDED PORT INSERTION  11/07/2021  ? left mastectomy    ? NEPHRECTOMY Right 02/06/2021  ? Procedure: NEPHRECTOMY- open radical;  Surgeon: Cleon Gustin, MD;  Location: [facility];  Service: Urology;  Laterality: Right;  ? POLYPECTOMY  03/03/2020  ? Procedure: POLYPECTOMY;  Surgeon: Daneil Dolin, MD;  Location: [facility];  Service: Endoscopy;;  ? ? ?SOCIAL HISTORY:  ?Social History  ? ?Socioeconomic History  ? Marital status: Married  ?  Spouse name: Not on file  ? Number of children: 2  ? Years of education: Not on file  ? Highest education level: Not on file  ?Occupational History  ? Occupation: CNA  ?Tobacco Use  ? Smoking status: Former  ?  Packs/day: 0.50  ?  Years: 18.00  ?  Pack years: 9.00  ?  Types: Cigarettes  ?  Quit date: 07/01/2002  ?  Years since quitting: 19.5  ? Smokeless tobacco: Never  ?Vaping Use  ? Vaping Use: Never used  ?Substance and Sexual Activity  ? Alcohol use: No  ? Drug use: No  ? Sexual activity: Not Currently  ?Other Topics Concern  ? Not on file  ?Social History Narrative  ? Not on file  ? ?Social Determinants of Health  ? ?Financial Resource Strain: Not on file  ?Food Insecurity: Not on file  ?Transportation Needs: Not on file  ?Physical Activity: Not on file  ?Stress: Not on file  ?Social Connections: Not on file  ?Intimate Partner Violence: Not on file  ? ? ?FAMILY HISTORY:  ?Family History  ?Problem Relation Age of Onset  ? Hypertension Mother   ? Diabetes Mother   ? Hyperlipidemia Mother   ? Colon cancer Maternal Aunt   ?     dx 17s  ? Prostate cancer Maternal Uncle   ? Throat cancer Maternal Uncle   ? Kidney cancer Maternal Grandfather   ? Lung cancer Half-Sister   ? Kidney cancer Half-Sister 10  ? Breast cancer Half-Sister 47  ? Breast cancer Half-Sister 45  ? ? ?CURRENT MEDICATIONS:   ?Current Outpatient Medications  ?Medication Sig Dispense Refill  ? acetaminophen (TYLENOL) 500 MG tablet Take 500 mg by mouth every 6 (six) hours as needed for moderate pain or headache.    ? amLODipine (NORVASC) 5 MG tablet TAKE 1 TABLET (5 MG TOTAL) BY MOUTH DAILY. 90 tablet 3  ? atorvastatin (LIPITOR) 10 MG tablet TAKE ONE TABLET BY MOUTH ONCE DAILY 90 tablet 3  ? EPINEPHrine 0.3 mg/0.3 mL IJ SOAJ injection Inject 0.3 mg into the muscle as needed for anaphylaxis. 2 each 0  ? Ipilimumab (YERVOY IV) Inject into the vein. Every 21 days x 4 cycles    ? Iron, Ferrous Sulfate, 325 (65 Fe) MG TABS Take 325 mg by mouth daily. 30 tablet 3  ? levothyroxine (SYNTHROID) 88 MCG tablet TAKE 1 TABLET(88  MCG) BY MOUTH DAILY BEFORE AND BREAKFAST 90 tablet 1  ? lidocaine-prilocaine (EMLA) cream Apply to affected area once 30 g 3  ? Multiple Vitamin (MULTIVITAMIN WITH MINERALS) TABS tablet Take 1 tablet by mouth daily.    ? Nivolumab (OPDIVO IV) Inject into the vein every 21 ( twenty-one) days. Every 21 days x 4 cycles then every 28 days    ? UNABLE TO FIND MASTECTOMY BRA AND PROTHESIS ?DX Z90.12 6 each 2  ? UNABLE TO FIND 6 mastectomy prosthesis and bras. 6 each 0  ? ?No current facility-administered medications for this visit.  ? ? ?ALLERGIES:  ?Allergies  ?Allergen Reactions  ? Sulfonamide Derivatives Itching  ? Yellow Jacket Venom [Bee Venom] Rash  ?  urticaria  ? ? ?PHYSICAL EXAM:  ?Performance status (ECOG): 0 - Asymptomatic ? ?There were no vitals filed for this visit. ?Wt Readings from Last 3 Encounters:  ?01/03/22 190 lb 8 oz (86.4 kg)  ?12/13/21 192 lb 9.6 oz (87.4 kg)  ?12/07/21 191 lb (86.6 kg)  ? ?Physical Exam ?Vitals reviewed.  ?Constitutional:   ?   Appearance: Normal appearance.  ?Cardiovascular:  ?   Rate and Rhythm: Normal rate and regular rhythm.  ?   Pulses: Normal pulses.  ?   Heart sounds: Normal heart sounds.  ?Pulmonary:  ?   Effort: Pulmonary effort is normal.  ?   Breath sounds: Normal breath sounds.   ?Musculoskeletal:  ?   Lumbar back: Tenderness (R loin below ribs) present.  ?Neurological:  ?   General: No focal deficit present.  ?   Mental Status: She is alert and oriented to person, place, and time.  ?Psychi

## 2022-01-03 NOTE — Progress Notes (Signed)
Patients port flushed without difficulty.  Good blood return noted with no bruising or swelling noted at site.  Patient remains accessed for treatment.  

## 2022-01-03 NOTE — Progress Notes (Signed)
Patient has been examined by Dr. Katragadda, and vital signs and labs have been reviewed. ANC, Creatinine, LFTs, hemoglobin, and platelets are within treatment parameters per M.D. - pt may proceed with treatment.    °

## 2022-01-06 ENCOUNTER — Encounter (HOSPITAL_COMMUNITY): Payer: Self-pay | Admitting: Hematology

## 2022-01-07 ENCOUNTER — Encounter (HOSPITAL_COMMUNITY): Payer: Self-pay | Admitting: Hematology

## 2022-01-18 ENCOUNTER — Encounter (HOSPITAL_COMMUNITY): Payer: Self-pay | Admitting: Hematology

## 2022-01-24 ENCOUNTER — Inpatient Hospital Stay (HOSPITAL_COMMUNITY): Payer: PRIVATE HEALTH INSURANCE | Attending: Hematology

## 2022-01-24 ENCOUNTER — Inpatient Hospital Stay (HOSPITAL_BASED_OUTPATIENT_CLINIC_OR_DEPARTMENT_OTHER): Payer: PRIVATE HEALTH INSURANCE | Admitting: Hematology

## 2022-01-24 ENCOUNTER — Inpatient Hospital Stay (HOSPITAL_COMMUNITY): Payer: PRIVATE HEALTH INSURANCE

## 2022-01-24 VITALS — BP 114/55 | HR 65 | Temp 97.0°F | Resp 18

## 2022-01-24 DIAGNOSIS — C787 Secondary malignant neoplasm of liver and intrahepatic bile duct: Secondary | ICD-10-CM | POA: Insufficient documentation

## 2022-01-24 DIAGNOSIS — Z79899 Other long term (current) drug therapy: Secondary | ICD-10-CM | POA: Insufficient documentation

## 2022-01-24 DIAGNOSIS — C641 Malignant neoplasm of right kidney, except renal pelvis: Secondary | ICD-10-CM

## 2022-01-24 DIAGNOSIS — D649 Anemia, unspecified: Secondary | ICD-10-CM | POA: Diagnosis not present

## 2022-01-24 DIAGNOSIS — Z5112 Encounter for antineoplastic immunotherapy: Secondary | ICD-10-CM | POA: Insufficient documentation

## 2022-01-24 LAB — COMPREHENSIVE METABOLIC PANEL
ALT: 12 U/L (ref 0–44)
AST: 19 U/L (ref 15–41)
Albumin: 3.4 g/dL — ABNORMAL LOW (ref 3.5–5.0)
Alkaline Phosphatase: 109 U/L (ref 38–126)
Anion gap: 7 (ref 5–15)
BUN: 20 mg/dL (ref 8–23)
CO2: 26 mmol/L (ref 22–32)
Calcium: 9.3 mg/dL (ref 8.9–10.3)
Chloride: 104 mmol/L (ref 98–111)
Creatinine, Ser: 1.08 mg/dL — ABNORMAL HIGH (ref 0.44–1.00)
GFR, Estimated: 58 mL/min — ABNORMAL LOW (ref >60)
Glucose, Bld: 113 mg/dL — ABNORMAL HIGH (ref 70–99)
Potassium: 4 mmol/L (ref 3.5–5.1)
Sodium: 137 mmol/L (ref 135–145)
Total Bilirubin: 0.3 mg/dL (ref 0.3–1.2)
Total Protein: 8 g/dL (ref 6.5–8.1)

## 2022-01-24 LAB — CBC WITH DIFFERENTIAL/PLATELET
Abs Immature Granulocytes: 0.01 10*3/uL (ref 0.00–0.07)
Basophils Absolute: 0 10*3/uL (ref 0.0–0.1)
Basophils Relative: 0 %
Eosinophils Absolute: 0.1 10*3/uL (ref 0.0–0.5)
Eosinophils Relative: 2 %
HCT: 28.8 % — ABNORMAL LOW (ref 36.0–46.0)
Hemoglobin: 9.1 g/dL — ABNORMAL LOW (ref 12.0–15.0)
Immature Granulocytes: 0 %
Lymphocytes Relative: 25 %
Lymphs Abs: 1.4 10*3/uL (ref 0.7–4.0)
MCH: 27.7 pg (ref 26.0–34.0)
MCHC: 31.6 g/dL (ref 30.0–36.0)
MCV: 87.5 fL (ref 80.0–100.0)
Monocytes Absolute: 0.5 10*3/uL (ref 0.1–1.0)
Monocytes Relative: 8 %
Neutro Abs: 3.6 10*3/uL (ref 1.7–7.7)
Neutrophils Relative %: 65 %
Platelets: 417 10*3/uL — ABNORMAL HIGH (ref 150–400)
RBC: 3.29 MIL/uL — ABNORMAL LOW (ref 3.87–5.11)
RDW: 14.3 % (ref 11.5–15.5)
WBC: 5.6 10*3/uL (ref 4.0–10.5)
nRBC: 0 % (ref 0.0–0.2)

## 2022-01-24 LAB — TSH: TSH: 3.486 u[IU]/mL (ref 0.350–4.500)

## 2022-01-24 LAB — MAGNESIUM: Magnesium: 2 mg/dL (ref 1.7–2.4)

## 2022-01-24 MED ORDER — HEPARIN SOD (PORK) LOCK FLUSH 100 UNIT/ML IV SOLN
500.0000 [IU] | Freq: Once | INTRAVENOUS | Status: AC | PRN
  Administered 2022-01-24: 500 [IU]

## 2022-01-24 MED ORDER — DIPHENHYDRAMINE HCL 50 MG/ML IJ SOLN
25.0000 mg | Freq: Once | INTRAMUSCULAR | Status: AC
  Administered 2022-01-24: 25 mg via INTRAVENOUS
  Filled 2022-01-24: qty 1

## 2022-01-24 MED ORDER — SODIUM CHLORIDE 0.9 % IV SOLN: Freq: Once | INTRAVENOUS | Status: AC

## 2022-01-24 MED ORDER — FAMOTIDINE IN NACL 20-0.9 MG/50ML-% IV SOLN
20.0000 mg | Freq: Once | INTRAVENOUS | Status: AC
  Administered 2022-01-24: 20 mg via INTRAVENOUS
  Filled 2022-01-24: qty 50

## 2022-01-24 MED ORDER — SODIUM CHLORIDE 0.9 % IV SOLN
240.0000 mg | Freq: Once | INTRAVENOUS | Status: AC
  Administered 2022-01-24: 240 mg via INTRAVENOUS
  Filled 2022-01-24: qty 4

## 2022-01-24 MED ORDER — SODIUM CHLORIDE 0.9% FLUSH
10.0000 mL | INTRAVENOUS | Status: DC | PRN
  Administered 2022-01-24: 10 mL

## 2022-01-24 MED ORDER — SODIUM CHLORIDE 0.9 % IV SOLN
1.0000 mg/kg | Freq: Once | INTRAVENOUS | Status: AC
  Administered 2022-01-24: 90 mg via INTRAVENOUS
  Filled 2022-01-24: qty 18

## 2022-01-24 NOTE — Progress Notes (Signed)
Patient has been examined by Dr. Katragadda, and vital signs and labs have been reviewed. ANC, Creatinine, LFTs, hemoglobin, and platelets are within treatment parameters per M.D. - pt may proceed with treatment.    °

## 2022-01-24 NOTE — Patient Instructions (Signed)
[facility]  Discharge Instructions: ?Thank you for choosing [facility] to provide your oncology and hematology care.  ?If you have a lab appointment with the [facility], please come in thru the Main Entrance and check in at the main information desk. ? ?Wear comfortable clothing and clothing appropriate for easy access to any Portacath or PICC line.  ? ?We strive to give you quality time with your provider. You may need to reschedule your appointment if you arrive late (15 or more minutes).  Arriving late affects you and other patients whose appointments are after yours.  Also, if you miss three or more appointments without notifying the office, you may be dismissed from the clinic at the provider?s discretion.    ?  ?For prescription refill requests, have your pharmacy contact our office and allow 72 hours for refills to be completed.   ? ?Today you received the following chemotherapy and/or immunotherapy agents Opdivo Yervoy    ?  ?To help prevent nausea and vomiting after your treatment, we encourage you to take your nausea medication as directed. ? ?BELOW ARE SYMPTOMS THAT SHOULD BE REPORTED IMMEDIATELY: ?*FEVER GREATER THAN 100.4 F (38 ?C) OR HIGHER ?*CHILLS OR SWEATING ?*NAUSEA AND VOMITING THAT IS NOT CONTROLLED WITH YOUR NAUSEA MEDICATION ?*UNUSUAL SHORTNESS OF BREATH ?*UNUSUAL BRUISING OR BLEEDING ?*URINARY PROBLEMS (pain or burning when urinating, or frequent urination) ?*BOWEL PROBLEMS (unusual diarrhea, constipation, pain near the anus) ?TENDERNESS IN MOUTH AND THROAT WITH OR WITHOUT PRESENCE OF ULCERS (sore throat, sores in mouth, or a toothache) ?UNUSUAL RASH, SWELLING OR PAIN  ?UNUSUAL VAGINAL DISCHARGE OR ITCHING  ? ?Items with * indicate a potential emergency and should be followed up as soon as possible or go to the Emergency Department if any problems should occur. ? ?Please show the CHEMOTHERAPY ALERT CARD or IMMUNOTHERAPY ALERT CARD at check-in to the Emergency  Department and triage nurse. ? ?Should you have questions after your visit or need to cancel or reschedule your appointment, please contact [facility] [phone]  and follow the prompts.  Office hours are 8:00 a.m. to 4:30 p.m. Monday - Friday. Please note that voicemails left after 4:00 p.m. may not be returned until the following business day.  We are closed weekends and major holidays. You have access to a nurse at all times for urgent questions. Please call the main number to the clinic [phone] and follow the prompts. ? ?For any non-urgent questions, you may also contact your provider using MyChart. We now offer e-Visits for anyone 76 and older to request care online for non-urgent symptoms. For details visit [email or web address]. ?  ?Also download the MyChart app! Go to the app store, search "MyChart", open the app, select [facility], and log in with your MyChart username and password. ? ?Due to Covid, a mask is required upon entering the hospital/clinic. If you do not have a mask, one will be given to you upon arrival. For doctor visits, patients may have 1 support person aged 32 or older with them. For treatment visits, patients cannot have anyone with them due to current Covid guidelines and our immunocompromised population.  ?

## 2022-01-24 NOTE — Progress Notes (Signed)
Labs reviewed with MD today. Will treat per MD.  ?

## 2022-01-24 NOTE — Progress Notes (Signed)
Patient presents today for Opdivo and yervoy infusions per providers order.  Vital signs within parameters for treatment.  MD reviewed labs. ? ?Message received from Anastasio Champion RN/Dr. Delton Coombes patient okay for treatment. ? ?Opdivo and Yervoy infusion given today per MD orders. Stable during infusion without adverse affects.  Vital signs stable.  No complaints at this time.  Discharge from clinic ambulatory in stable condition.  Alert and oriented X 3.  Follow up with [facility] as scheduled.  ?

## 2022-01-24 NOTE — Progress Notes (Signed)
Ipilimumab (YERVOY) Patient Monitoring Assessment  ? ?Is the patient experiencing any of the following general symptoms?:  ?'[]'$ Difficulty performing normal activities ?'[]'$ Feeling sluggish or cold all the time ?'[]'$ Unusual weight gain ?'[]'$ Constant or unusual headaches ?'[]'$ Feeling dizzy or faint ?'[]'$ Changes in eyesight (blurry vision, double vision, or other vision problems) ?'[]'$ Changes in mood or behavior (ex: decreased sex drive, irritability, or forgetfulness) ?'[]'$ Starting new medications (ex: steroids, other medications that lower immune response) ?'[x]'$ Patient is not experiencing any of the general symptoms above.  ? ? ?Gastrointestinal  ?Patient is having 1 bowel movements each day.  ?Is this different from baseline? '[]'$ Yes '[x]'$ No ?Are your stools watery or do they have a foul smell? '[]'$ Yes '[x]'$ No ?Have you seen blood in your stools? '[]'$ Yes '[x]'$ No ?Are your stools dark, tarry, or sticky? '[]'$ Yes '[x]'$ No ?Are you having pain or tenderness in your belly? '[]'$ Yes '[x]'$ No ? ?Skin ?Does your skin itch? '[]'$ Yes '[x]'$ No ?Do you have a rash? '[]'$ Yes '[x]'$ No ?Has your skin blistered and/or peeled? '[]'$ Yes '[x]'$ No ?Do you have sores in your mouth? '[]'$ Yes '[x]'$ No ? ?Hepatic ?Has your urine been dark or tea colored? '[]'$ Yes '[x]'$ No ?Have you noticed that your skin or the whites of your eyes are turning yellow? '[]'$ Yes '[x]'$ No ?Are you bleeding or bruising more easily than normal? '[]'$ Yes '[x]'$ No ?Are you nauseous and/or vomiting? '[]'$ Yes '[x]'$ No ?Do you have pain on the right side of your stomach? '[]'$ Yes '[x]'$ No ? ?Neurologic  ?Are you having unusual weakness of legs, arms, or face? '[]'$ Yes '[x]'$ No ?Are you having numbness or tingling in your hands or feet? '[]'$ Yes '[x]'$ No ? ?Marlane Hatcher  ?

## 2022-01-24 NOTE — Patient Instructions (Addendum)
[place] at [facility] ?Discharge Instructions ? ? ?You were seen and examined today by Dr. Delton Coombes. ? ?He reviewed your lab work.  Results were stable except your hemoglobin is low and your kidney number is a little elevated.  Drink more fluids and continue to take iron tablets every other day. ? ?We will proceed with your treatment today. ? ?Return as scheduled in 4 weeks.  ? ? ?Thank you for choosing [place] at [facility] to provide your oncology and hematology care.  To afford each patient quality time with our provider, please arrive at least 15 minutes before your scheduled appointment time.  ? ?If you have a lab appointment with the [facility] please come in thru the Main Entrance and check in at the main information desk. ? ?You need to re-schedule your appointment should you arrive 10 or more minutes late.  We strive to give you quality time with our providers, and arriving late affects you and other patients whose appointments are after yours.  Also, if you no show three or more times for appointments you may be dismissed from the clinic at the providers discretion.     ?Again, thank you for choosing [facility].  Our hope is that these requests will decrease the amount of time that you wait before being seen by our physicians.       ?_____________________________________________________________ ? ?Should you have questions after your visit to [facility], please contact our office at [phone] and follow the prompts.  Our office hours are 8:00 a.m. and 4:30 p.m. Monday - Friday.  Please note that voicemails left after 4:00 p.m. may not be returned until the following business day.  We are closed weekends and major holidays.  You do have access to a nurse 24-7, just call the main number to the clinic [phone] and do not press any options, hold on the line and a nurse will answer the phone.   ? ?For prescription  refill requests, have your pharmacy contact our office and allow 72 hours.   ? ?Due to Covid, you will need to wear a mask upon entering the hospital. If you do not have a mask, a mask will be given to you at the Main Entrance upon arrival. For doctor visits, patients may have 1 support person age 15 or older with them. For treatment visits, patients can not have anyone with them due to social distancing guidelines and our immunocompromised population.  ? ?   ?

## 2022-01-24 NOTE — Progress Notes (Signed)
? ?[place] ?[address] ?[place], [place] [address] ? ? ?CLINIC:  ?Medical Oncology/Hematology ? ?PCP:  ?Fayrene Helper, MD ?[address], [place] / [place] [address] ?[phone] ? ? 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 ?PRIOR THERAPY: none ? ?NGS Results: did not show any targetable mutations.  TMB was low.  CCND3 amplified.  PBRM1 VUS present. ? ?CURRENT THERAPY: Nivolumab + Ipilimumab q21d / Nivolumab q28d ? ?BRIEF ONCOLOGIC HISTORY:  ?Oncology History  ?Renal cell cancer ([place])  ?06/20/2021 Initial Diagnosis  ? Renal cell carcinoma of right kidney (HCC) ? ?  ?11/19/2021 -  Chemotherapy  ? Patient is on Treatment Plan : RENAL CELL CARCINOMA Nivolumab + Ipilimumab q21d / Nivolumab q28d  ? ?  ?  ? Genetic Testing  ? Negative genetic testing. No pathogenic variants identified on the [facility] Multi-Cancer Panel + RNA. VUS in PMS2 called c.1732C>A and in RECQL4 called c.2967G>A  Identified. The report date is 11/01/2021.  ? ?The Multi-Cancer Panel + RNA offered by [facility] includes sequencing and/or deletion duplication testing of the following 84 genes: AIP, ALK, APC, ATM, AXIN2,BAP1,  BARD1, BLM, BMPR1A, BRCA1, BRCA2, BRIP1, CASR, CDC73, CDH1, CDK4, CDKN1B, CDKN1C, CDKN2A (p14ARF), CDKN2A (p16INK4a), CEBPA, CHEK2, CTNNA1, DICER1, DIS3L2, EGFR (c.2369C>T, p.Thr790Met variant only), EPCAM (Deletion/duplication testing only), FH, FLCN, GATA2, GPC3, GREM1 (Promoter region deletion/duplication testing only), HOXB13 (c.251G>A, p.Gly84Glu), HRAS, KIT, MAX, MEN1, MET, MITF (c.952G>A, p.Glu318Lys variant only), MLH1, MSH2, MSH3, MSH6, MUTYH, NBN, NF1, NF2, NTHL1, PALB2, PDGFRA, PHOX2B, PMS2, POLD1, POLE, POT1, PRKAR1A, PTCH1, PTEN, RAD50, RAD51C, RAD51D, RB1, RECQL4, RET, RUNX1, SDHAF2, SDHA (sequence changes only), SDHB, SDHC, SDHD, SMAD4, SMARCA4, SMARCB1, SMARCE1, STK11, SUFU, TERC, TERT, TMEM127, TP53, TSC1, TSC2, VHL, WRN and WT1. ? ?  ? ? ?CANCER  STAGING: ? 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 ?Renal cell cancer ([place]) ?Staging form: Kidney, AJCC 8th Edition ?- Clinical stage from 09/26/2021: Stage IV (cT3c, cNX, cM1) - Unsigned ? ? ?INTERVAL HISTORY:  ?Deanna Bush, a 61 y.o. female, returns for routine follow-up of her metastatic clear-cell renal cell carcinoma. Deanna Bush was last seen on 01/06/2022.  ? ?Today she reports feeling good. She denies diarrhea, cough, rash, and fatigue. She reports dry skin on her hands. She reports her occasional right sided lower back pain has improved, and she does not require pain medication for it.  ? ?REVIEW OF SYSTEMS:  ?Review of Systems  ?Constitutional:  Negative for appetite change and fatigue.  ?Respiratory:  Negative for cough.   ?Gastrointestinal:  Negative for diarrhea.  ?Musculoskeletal:  Negative for back pain (improved).  ?Skin:  Negative for rash.  ?     Dry skin  ?Psychiatric/Behavioral:  Positive for sleep disturbance.   ?All other systems reviewed and are negative. ? ?PAST MEDICAL/SURGICAL HISTORY:  ?Past Medical History:  ?Diagnosis Date  ? 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 left   ? Cellulitis of leg, left   ? Complication of anesthesia   ? Hard to wake up  ? Diabetes mellitus without complication ([place])   ? Pt denies  ? Family history of breast cancer 08/16/2011  ? Family history of breast cancer   ? Family history of colon cancer   ? Family history of kidney cancer   ? Family history of prostate cancer   ?  Hyperlipidemia   ? Hypertension   ? Hypothyroidism   ? MRSA (methicillin resistant staph aureus) culture positive 08/19/2011  ? Pre-diabetes   ? ?Past Surgical History:  ?Procedure Laterality Date  ? BREAST SURGERY Left   ? mastectomy  ? CESAREAN SECTION    ? x2  ? COLONOSCOPY N/A 03/03/2020  ? Procedure: COLONOSCOPY;  Surgeon: Daneil Dolin, MD;  [facility]:  [facility];  Service: Endoscopy;  Laterality: N/A;  12:00  ? IR IMAGING GUIDED PORT INSERTION  11/07/2021  ? left mastectomy    ? NEPHRECTOMY Right 02/06/2021  ? Procedure: NEPHRECTOMY- open radical;  Surgeon: Cleon Gustin, MD;  Location: [facility] OR[facility];  Service: Urology;  Laterality: Right;  ? POLYPECTOMY  03/03/2020  ? Procedure: POLYPECTOMY;  Surgeon: Daneil Dolin, MD;  Location: [facility];  Service: Endoscopy;;  ? ? ?SOCIAL HISTORY:  ?Social History  ? ?Socioeconomic History  ? Marital status: Married  ?  Spouse name: Not on file  ? Number of children: 2  ? Years of education: Not on file  ? Highest education level: Not on file  ?Occupational History  ? Occupation: CNA  ?Tobacco Use  ? Smoking status: Former  ?  Packs/day: 0.50  ?  Years: 18.00  ?  Pack years: 9.00  ?  Types: Cigarettes  ?  Quit date: 07/01/2002  ?  Years since quitting: 19.5  ? Smokeless tobacco: Never  ?Vaping Use  ? Vaping Use: Never used  ?Substance and Sexual Activity  ? Alcohol use: No  ? Drug use: No  ? Sexual activity: Not Currently  ?Other Topics Concern  ? Not on file  ?Social History Narrative  ? Not on file  ? ?Social Determinants of Health  ? ?Financial Resource Strain: Not on file  ?Food Insecurity: Not on file  ?Transportation Needs: Not on file  ?Physical Activity: Not on file  ?Stress: Not on file  ?Social Connections: Not on file  ?Intimate Partner Violence: Not on file  ? ? ?FAMILY HISTORY:  ?Family History  ?Problem Relation Age of Onset  ? Hypertension Mother   ? Diabetes Mother   ? Hyperlipidemia Mother   ? Colon cancer Maternal Aunt   ?     dx 62s  ? Prostate cancer Maternal Uncle   ? Throat cancer Maternal Uncle   ? Kidney cancer Maternal Grandfather   ? Lung cancer Half-Sister   ? Kidney cancer Half-Sister 38  ? Breast cancer Half-Sister 52  ? Breast cancer Half-Sister 28  ? ? ?CURRENT MEDICATIONS:  ?Current Outpatient Medications  ?Medication Sig Dispense Refill  ? acetaminophen (TYLENOL) 500 MG tablet Take 500 mg  by mouth every 6 (six) hours as needed for moderate pain or headache.    ? amLODipine (NORVASC) 5 MG tablet TAKE 1 TABLET (5 MG TOTAL) BY MOUTH DAILY. 90 tablet 3  ? atorvastatin (LIPITOR) 10 MG tablet TAKE ONE TABLET BY MOUTH ONCE DAILY 90 tablet 3  ? EPINEPHrine 0.3 mg/0.3 mL IJ SOAJ injection Inject 0.3 mg into the muscle as needed for anaphylaxis. 2 each 0  ? Ipilimumab (YERVOY IV) Inject into the vein. Every 21 days x 4 cycles    ? Iron, Ferrous Sulfate, 325 (65 Fe) MG TABS Take 325 mg by mouth daily. 30 tablet 3  ? levothyroxine (SYNTHROID) 88 MCG tablet TAKE 1 TABLET(88 MCG) BY MOUTH DAILY BEFORE BREAKFAST 90 tablet 0  ? Multiple Vitamin (MULTIVITAMIN WITH MINERALS) TABS tablet Take 1 tablet by mouth  daily.    ? Nivolumab (OPDIVO IV) Inject into the vein every 21 ( twenty-one) days. Every 21 days x 4 cycles then every 28 days    ? UNABLE TO FIND MASTECTOMY BRA AND PROTHESIS ?DX Z90.12 6 each 2  ? UNABLE TO FIND 6 mastectomy prosthesis and bras. 6 each 0  ? lidocaine-prilocaine (EMLA) cream Apply to affected area once (Patient not taking: Reported on 01/24/2022) 30 g 3  ? ?No current facility-administered medications for this visit.  ? ? ?ALLERGIES:  ?Allergies  ?Allergen Reactions  ? Sulfonamide Derivatives Itching  ? Yellow Jacket Venom [Bee Venom] Rash  ?  urticaria  ? ? ?PHYSICAL EXAM:  ?Performance status (ECOG): 0 - Asymptomatic ? ?There were no vitals filed for this visit. ?Wt Readings from Last 3 Encounters:  ?01/24/22 189 lb (85.7 kg)  ?01/03/22 190 lb 8 oz (86.4 kg)  ?12/13/21 192 lb 9.6 oz (87.4 kg)  ? ?Physical Exam ?Vitals reviewed.  ?Constitutional:   ?   Appearance: Normal appearance.  ?Cardiovascular:  ?   Rate and Rhythm: Normal rate and regular rhythm.  ?   Pulses: Normal pulses.  ?   Heart sounds: Normal heart sounds.  ?Pulmonary:  ?   Effort: Pulmonary effort is normal.  ?   Breath sounds: Normal breath sounds.  ?Neurological:  ?   General: No focal deficit present.  ?   Mental Status: She  is alert and oriented to person, place, and time.  ?Psychiatric:     ?   Mood and Affect: Mood normal.     ?   Behavior: Behavior normal.  ?  ? ?LABORATORY DATA:  ?I have reviewed the labs as listed.  ? ?  [place]

## 2022-02-14 ENCOUNTER — Other Ambulatory Visit (HOSPITAL_COMMUNITY): Payer: No Typology Code available for payment source

## 2022-02-14 ENCOUNTER — Other Ambulatory Visit (HOSPITAL_COMMUNITY): Payer: Self-pay | Admitting: Hematology

## 2022-02-14 ENCOUNTER — Ambulatory Visit (HOSPITAL_COMMUNITY): Payer: No Typology Code available for payment source

## 2022-02-14 ENCOUNTER — Ambulatory Visit (HOSPITAL_COMMUNITY)
Admission: RE | Admit: 2022-02-14 | Discharge: 2022-02-14 | Disposition: A | Discharge status: Home / Self Care | Payer: PRIVATE HEALTH INSURANCE | Source: Ambulatory Visit | Attending: Hematology | Admitting: Hematology

## 2022-02-14 DIAGNOSIS — C641 Malignant neoplasm of right kidney, except renal pelvis: Secondary | ICD-10-CM

## 2022-02-14 MED ORDER — IOHEXOL 300 MG/ML  SOLN
100.0000 mL | Freq: Once | INTRAMUSCULAR | Status: AC | PRN
  Administered 2022-02-14: 100 mL via INTRAVENOUS

## 2022-02-21 ENCOUNTER — Inpatient Hospital Stay (HOSPITAL_BASED_OUTPATIENT_CLINIC_OR_DEPARTMENT_OTHER): Payer: PRIVATE HEALTH INSURANCE | Admitting: Hematology

## 2022-02-21 ENCOUNTER — Inpatient Hospital Stay (HOSPITAL_COMMUNITY): Payer: PRIVATE HEALTH INSURANCE

## 2022-02-21 ENCOUNTER — Other Ambulatory Visit (HOSPITAL_COMMUNITY): Payer: Self-pay

## 2022-02-21 ENCOUNTER — Inpatient Hospital Stay (HOSPITAL_COMMUNITY): Payer: PRIVATE HEALTH INSURANCE | Attending: Hematology

## 2022-02-21 DIAGNOSIS — Z8042 Family history of malignant neoplasm of prostate: Secondary | ICD-10-CM | POA: Diagnosis not present

## 2022-02-21 DIAGNOSIS — E119 Type 2 diabetes mellitus without complications: Secondary | ICD-10-CM | POA: Diagnosis not present

## 2022-02-21 DIAGNOSIS — Z800 Family history of malignant neoplasm of digestive organs: Secondary | ICD-10-CM | POA: Diagnosis not present

## 2022-02-21 DIAGNOSIS — Z8051 Family history of malignant neoplasm of kidney: Secondary | ICD-10-CM | POA: Insufficient documentation

## 2022-02-21 DIAGNOSIS — C641 Malignant neoplasm of right kidney, except renal pelvis: Secondary | ICD-10-CM | POA: Insufficient documentation

## 2022-02-21 DIAGNOSIS — C787 Secondary malignant neoplasm of liver and intrahepatic bile duct: Secondary | ICD-10-CM | POA: Diagnosis not present

## 2022-02-21 DIAGNOSIS — I10 Essential (primary) hypertension: Secondary | ICD-10-CM | POA: Insufficient documentation

## 2022-02-21 DIAGNOSIS — D649 Anemia, unspecified: Secondary | ICD-10-CM | POA: Insufficient documentation

## 2022-02-21 DIAGNOSIS — Z803 Family history of malignant neoplasm of breast: Secondary | ICD-10-CM | POA: Insufficient documentation

## 2022-02-21 DIAGNOSIS — Z87891 Personal history of nicotine dependence: Secondary | ICD-10-CM | POA: Diagnosis not present

## 2022-02-21 DIAGNOSIS — Z801 Family history of malignant neoplasm of trachea, bronchus and lung: Secondary | ICD-10-CM | POA: Insufficient documentation

## 2022-02-21 DIAGNOSIS — Z79899 Other long term (current) drug therapy: Secondary | ICD-10-CM | POA: Diagnosis not present

## 2022-02-21 DIAGNOSIS — Z95828 Presence of other vascular implants and grafts: Secondary | ICD-10-CM

## 2022-02-21 LAB — CBC WITH DIFFERENTIAL/PLATELET
Abs Immature Granulocytes: 0.01 10*3/uL (ref 0.00–0.07)
Basophils Absolute: 0 10*3/uL (ref 0.0–0.1)
Basophils Relative: 0 %
Eosinophils Absolute: 0.1 10*3/uL (ref 0.0–0.5)
Eosinophils Relative: 2 %
HCT: 28.2 % — ABNORMAL LOW (ref 36.0–46.0)
Hemoglobin: 8.8 g/dL — ABNORMAL LOW (ref 12.0–15.0)
Immature Granulocytes: 0 %
Lymphocytes Relative: 26 %
Lymphs Abs: 1.6 10*3/uL (ref 0.7–4.0)
MCH: 26.6 pg (ref 26.0–34.0)
MCHC: 31.2 g/dL (ref 30.0–36.0)
MCV: 85.2 fL (ref 80.0–100.0)
Monocytes Absolute: 0.5 10*3/uL (ref 0.1–1.0)
Monocytes Relative: 8 %
Neutro Abs: 3.8 10*3/uL (ref 1.7–7.7)
Neutrophils Relative %: 64 %
Platelets: 417 10*3/uL — ABNORMAL HIGH (ref 150–400)
RBC: 3.31 MIL/uL — ABNORMAL LOW (ref 3.87–5.11)
RDW: 14.8 % (ref 11.5–15.5)
WBC: 5.9 10*3/uL (ref 4.0–10.5)
nRBC: 0 % (ref 0.0–0.2)

## 2022-02-21 LAB — COMPREHENSIVE METABOLIC PANEL
ALT: 14 U/L (ref 0–44)
AST: 20 U/L (ref 15–41)
Albumin: 3.4 g/dL — ABNORMAL LOW (ref 3.5–5.0)
Alkaline Phosphatase: 118 U/L (ref 38–126)
Anion gap: 7 (ref 5–15)
BUN: 15 mg/dL (ref 8–23)
CO2: 26 mmol/L (ref 22–32)
Calcium: 9.1 mg/dL (ref 8.9–10.3)
Chloride: 104 mmol/L (ref 98–111)
Creatinine, Ser: 1.03 mg/dL — ABNORMAL HIGH (ref 0.44–1.00)
GFR, Estimated: >60 mL/min (ref >60)
Glucose, Bld: 112 mg/dL — ABNORMAL HIGH (ref 70–99)
Potassium: 4.1 mmol/L (ref 3.5–5.1)
Sodium: 137 mmol/L (ref 135–145)
Total Bilirubin: 0.2 mg/dL — ABNORMAL LOW (ref 0.3–1.2)
Total Protein: 7.9 g/dL (ref 6.5–8.1)

## 2022-02-21 LAB — IRON AND TIBC
Iron: 27 ug/dL — ABNORMAL LOW (ref 28–170)
Saturation Ratios: 10 % — ABNORMAL LOW (ref 10.4–31.8)
TIBC: 262 ug/dL (ref 250–450)
UIBC: 235 ug/dL

## 2022-02-21 LAB — LACTATE DEHYDROGENASE: LDH: 178 U/L (ref 98–192)

## 2022-02-21 LAB — MAGNESIUM: Magnesium: 2.2 mg/dL (ref 1.7–2.4)

## 2022-02-21 LAB — FERRITIN: Ferritin: 173 ng/mL (ref 11–307)

## 2022-02-21 LAB — TSH: TSH: 6.639 u[IU]/mL — ABNORMAL HIGH (ref 0.350–4.500)

## 2022-02-21 MED ORDER — EVEROLIMUS 5 MG PO TABS
5.0000 mg | ORAL_TABLET | Freq: Every day | ORAL | 0 refills | Status: DC
  Filled 2022-02-21: qty 30, 30d supply, fill #0

## 2022-02-21 MED ORDER — HEPARIN SOD (PORK) LOCK FLUSH 100 UNIT/ML IV SOLN
500.0000 [IU] | Freq: Once | INTRAVENOUS | Status: AC
  Administered 2022-02-21: 500 [IU] via INTRAVENOUS

## 2022-02-21 MED ORDER — SODIUM CHLORIDE 0.9% FLUSH
10.0000 mL | Freq: Once | INTRAVENOUS | Status: AC
  Administered 2022-02-21: 10 mL via INTRAVENOUS

## 2022-02-21 MED ORDER — PROCHLORPERAZINE MALEATE 10 MG PO TABS: 10.0000 mg | ORAL_TABLET | Freq: Four times a day (QID) | ORAL | 6 refills | Status: DC | PRN

## 2022-02-21 MED ORDER — LENVATINIB (4 MG DAILY DOSE) 4 MG PO CPPK
12.0000 mg | ORAL_CAPSULE | Freq: Every day | ORAL | 0 refills | Status: DC
  Filled 2022-02-21: qty 90, 30d supply, fill #0

## 2022-02-21 NOTE — Patient Instructions (Signed)
[place] at [facility] Discharge Instructions   You were seen and examined today by Dr. Delton Coombes.  He reviewed the results of your CT scan. It shows progression of disease. We will switch your treatment to 2 different pills. These will come from [facility] initially. Then they will set you up with a specialty pharmacy who will provide these medications. They will call you to set up delivery of these medications.   We will send you in some nausea medication in case you need it. Also, get Imodium to take as needed for diarrhea.   We will see you back in 3 weeks.    Thank you for choosing [place] at [facility] to provide your oncology and hematology care.  To afford each patient quality time with our provider, please arrive at least 15 minutes before your scheduled appointment time.   If you have a lab appointment with the [place] please come in thru the Main Entrance and check in at the main information desk.  You need to re-schedule your appointment should you arrive 10 or more minutes late.  We strive to give you quality time with our providers, and arriving late affects you and other patients whose appointments are after yours.  Also, if you no show three or more times for appointments you may be dismissed from the clinic at the providers discretion.     Again, thank you for choosing [facility].  Our hope is that these requests will decrease the amount of time that you wait before being seen by our physicians.       _____________________________________________________________  Should you have questions after your visit to [facility], please contact our office at [phone] and follow the prompts.  Our office hours are 8:00 a.m. and 4:30 p.m. Monday - Friday.  Please note that voicemails left after 4:00 p.m. may not be returned until the following business day.  We are closed  weekends and major holidays.  You do have access to a nurse 24-7, just call the main number to the clinic [phone] and do not press any options, hold on the line and a nurse will answer the phone.    For prescription refill requests, have your pharmacy contact our office and allow 72 hours.    Due to Covid, you will need to wear a mask upon entering the hospital. If you do not have a mask, a mask will be given to you at the Main Entrance upon arrival. For doctor visits, patients may have 1 support person age 86 or older with them. For treatment visits, patients can not have anyone with them due to social distancing guidelines and our immunocompromised population.

## 2022-02-21 NOTE — Progress Notes (Signed)
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  TMB was low.  CCND3 amplified.  PBRM1 VUS present.  CURRENT THERAPY: Nivolumab + Ipilimumab q21d / Nivolumab q28d  BRIEF ONCOLOGIC HISTORY:  Oncology History  Renal cell Bush ([facility])  06/20/2021 Initial Diagnosis   Renal cell carcinoma of right kidney ([facility])    11/19/2021 -  Chemotherapy   Patient is on Treatment Plan : RENAL CELL CARCINOMA Nivolumab + Ipilimumab q21d / Nivolumab q28d       Genetic Testing   Negative genetic testing. No pathogenic variants identified on the [facility] Multi-Bush Panel + RNA. VUS in PMS2 called c.1732C>A and in RECQL4 called c.2967G>A  Identified. The report date is 11/01/2021.   The Multi-Bush Panel + RNA offered by [facility] includes sequencing and/or deletion duplication testing of the following 84 genes: AIP, ALK, APC, ATM, AXIN2,BAP1,  BARD1, BLM, BMPR1A, BRCA1, BRCA2, BRIP1, CASR, CDC73, CDH1, CDK4, CDKN1B, CDKN1C, CDKN2A (p14ARF), CDKN2A (p16INK4a), CEBPA, CHEK2, CTNNA1, DICER1, DIS3L2, EGFR (c.2369C>T, p.Thr790Met variant only), EPCAM (Deletion/duplication testing only), FH, FLCN, GATA2, GPC3, GREM1 (Promoter region deletion/duplication testing only), HOXB13 (c.251G>A, p.Gly84Glu), HRAS, KIT, MAX, MEN1, MET, MITF (c.952G>A, p.Glu318Lys variant only), MLH1, MSH2, MSH3, MSH6, MUTYH, NBN, NF1, NF2, NTHL1, PALB2, PDGFRA, PHOX2B, PMS2, POLD1, POLE, POT1, PRKAR1A, PTCH1, PTEN, RAD50, RAD51C, RAD51D, RB1, RECQL4, RET, RUNX1, SDHAF2, SDHA (sequence changes only), SDHB, SDHC, SDHD, SMAD4, SMARCA4, SMARCB1, SMARCE1, STK11, SUFU, TERC, TERT, TMEM127, TP53, TSC1, TSC2, VHL, WRN and WT1.      Bush  STAGING:  Bush Staging  Malignant neoplasm of female breast [facility]) Staging form: Breast, AJCC 6th Edition - Clinical: Stage I (T1b, N0, M0) - Signed by Deanna Cancer, PA on 08/16/2011  Renal cell Bush [facility]) Staging form: Kidney, AJCC 8th Edition - Clinical stage from 09/26/2021: Stage IV (cT3c, cNX, cM1) - Unsigned   INTERVAL HISTORY:  Deanna Bush, a 61 y.o. female, returns for routine follow-up and consideration for next cycle of chemotherapy. Deanna Bush was last seen on 01/24/2022.  Due for cycle #5 of Nivolumab today.   Overall, she tells me she has been feeling pretty well. She reports intermittent lower back pain, but she has not required pain medication for it. She denies new pains. Her appetite is good, and she denies cough.  Overall, she feels ready for next cycle of chemo today.   REVIEW OF SYSTEMS:  Review of Systems  Constitutional:  Negative for appetite change and fatigue.  Respiratory:  Negative for cough.   Musculoskeletal:  Positive for back pain (6/10).  All other systems reviewed and are negative.  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[facility])    left    Cellulitis of leg, left    Complication of anesthesia    Hard to wake up   Diabetes mellitus without complication ([facility])    Pt denies   Family history of breast Bush 08/16/2011   Family history of breast Bush    Family history of colon Bush    Family history of kidney Bush    Family history of prostate Bush    Hyperlipidemia    Hypertension    Hypothyroidism  MRSA (methicillin resistant staph aureus) culture positive 08/19/2011   Pre-diabetes    Past Surgical History:  Procedure Laterality Date   BREAST SURGERY Left    mastectomy   CESAREAN SECTION     x2   COLONOSCOPY N/A 03/03/2020   Procedure: COLONOSCOPY;  Surgeon: Deanna Dolin, MD;  Location: [facility];  Service: Endoscopy;  Laterality: N/A;  12:00   IR  IMAGING GUIDED PORT INSERTION  11/07/2021   left mastectomy     NEPHRECTOMY Right 02/06/2021   Procedure: NEPHRECTOMY- open radical;  Surgeon: Deanna Gustin, MD;  Location: [facility];  Service: Urology;  Laterality: Right;   POLYPECTOMY  03/03/2020   Procedure: POLYPECTOMY;  Surgeon: Deanna Dolin, MD;  Location: [facility];  Service: Endoscopy;;    SOCIAL HISTORY:  Social History   Socioeconomic History   Marital status: Married    Spouse name: Not on file   Number of children: 2   Years of education: Not on file   Highest education level: Not on file  Occupational History   Occupation: CNA  Tobacco Use   Smoking status: Former    Packs/day: 0.50    Years: 18.00    Pack years: 9.00    Types: Cigarettes    Quit date: 07/01/2002    Years since quitting: 19.6   Smokeless tobacco: Never  Vaping Use   Vaping Use: Never used  Substance and Sexual Activity   Alcohol use: No   Drug use: No   Sexual activity: Not Currently  Other Topics Concern   Not on file  Social History Narrative   Not on file   Social Determinants of Health   Financial Resource Strain: Not on file  Food Insecurity: Not on file  Transportation Needs: Not on file  Physical Activity: Not on file  Stress: Not on file  Social Connections: Not on file  Intimate Partner Violence: Not on file    FAMILY HISTORY:  Family History  Problem Relation Age of Onset   Hypertension Mother    Diabetes Mother    Hyperlipidemia Mother    Colon Bush Maternal Aunt        dx 70s   Prostate Bush Maternal Uncle    Throat Bush Maternal Uncle    Kidney Bush Maternal Grandfather    Lung Bush Half-Sister    Kidney Bush Half-Sister 41   Breast Bush Half-Sister 38   Breast Bush Half-Sister 49    CURRENT MEDICATIONS:  Current Outpatient Medications  Medication Sig Dispense Refill   acetaminophen (TYLENOL) 500 MG tablet Take 500 mg by mouth every 6 (six) hours as needed for moderate pain or  headache.     amLODipine (NORVASC) 5 MG tablet TAKE 1 TABLET (5 MG TOTAL) BY MOUTH DAILY. 90 tablet 3   atorvastatin (LIPITOR) 10 MG tablet TAKE ONE TABLET BY MOUTH ONCE DAILY 90 tablet 3   EPINEPHrine 0.3 mg/0.3 mL IJ SOAJ injection Inject 0.3 mg into the muscle as needed for anaphylaxis. 2 each 0   Ipilimumab (YERVOY IV) Inject into the vein. Every 21 days x 4 cycles     Iron, Ferrous Sulfate, 325 (65 Fe) MG TABS Take 325 mg by mouth daily. 30 tablet 3   levothyroxine (SYNTHROID) 88 MCG tablet TAKE 1 TABLET(88 MCG) BY MOUTH DAILY BEFORE BREAKFAST 90 tablet 0   lidocaine-prilocaine (EMLA) cream Apply to affected area once (Patient not taking: Reported on 01/24/2022) 30 g 3   Multiple Vitamin (MULTIVITAMIN WITH MINERALS)  TABS tablet Take 1 tablet by mouth daily.     Nivolumab (OPDIVO IV) Inject into the vein every 21 ( twenty-one) days. Every 21 days x 4 cycles then every 28 days     UNABLE TO FIND MASTECTOMY BRA AND PROTHESIS DX Z90.12 6 each 2   UNABLE TO FIND 6 mastectomy prosthesis and bras. 6 each 0   No current facility-administered medications for this visit.    ALLERGIES:  Allergies  Allergen Reactions   Sulfonamide Derivatives Itching   Yellow Jacket Venom [Bee Venom] Rash    urticaria    PHYSICAL EXAM:  Performance status (ECOG): 0 - Asymptomatic  There were no vitals filed for this visit. Wt Readings from Last 3 Encounters:  02/21/22 187 lb 3.2 oz (84.9 kg)  01/24/22 189 lb (85.7 kg)  01/03/22 190 lb 8 oz (86.4 kg)   Physical Exam  LABORATORY DATA:  I have reviewed the labs as listed.     Latest Ref Rng & Units 02/21/2022    8:51 AM 01/24/2022    8:40 AM 01/03/2022    9:15 AM  CBC  WBC 4.0 - 10.5 K/uL 5.9   5.6   6.0    Hemoglobin 12.0 - 15.0 g/dL 8.8   9.1   9.5    Hematocrit 36.0 - 46.0 % 28.2   28.8   30.2    Platelets 150 - 400 K/uL 417   417   393        Latest Ref Rng & Units 02/21/2022    8:51 AM 01/24/2022    8:40 AM 01/03/2022    9:15 AM  CMP   Glucose 70 - 99 mg/dL 112   113   104    BUN 8 - 23 mg/dL _0 Creatinine 0.44 - 1.00 mg/dL 1.03   1.08   0.99    Sodium 135 - 145 mmol/L 137   137   133    Potassium 3.5 - 5.1 mmol/L 4.1   4.0   4.1    Chloride 98 - 111 mmol/L 104   104   99    CO2 22 - 32 mmol/L _1 Calcium 8.9 - 10.3 mg/dL 9.1   9.3   9.2    Total Protein 6.5 - 8.1 g/dL 7.9   8.0   7.7    Total Bilirubin 0.3 - 1.2 mg/dL 0.2   0.3   0.5    Alkaline Phos 38 - 126 U/L 118   109   105    AST 15 - 41 U/L _2 ALT 0 - 44 U/L _3 DIAGNOSTIC IMAGING:  I have independently reviewed the scans and discussed with the patient. CT ABDOMEN PELVIS W WO CONTRAST  Result Date: 02/17/2022 CLINICAL DATA:  Metastatic renal cell carcinoma, status post right nephrectomy, ongoing chemotherapy, additional history of left breast Bush status post mastectomy * Tracking Code: BO * EXAM: CT CHEST WITH CONTRAST CT ABDOMEN AND PELVIS WITH AND WITHOUT CONTRAST TECHNIQUE: Multidetector CT imaging of the chest was performed during intravenous contrast administration. Multidetector CT imaging of the abdomen and pelvis was performed following the standard protocol before and during bolus administration of intravenous contrast. RADIATION DOSE REDUCTION: This exam was performed according to the departmental dose-optimization program which includes automated exposure control,  adjustment of the mA and/or kV according to patient size and/or use of iterative reconstruction technique. CONTRAST:  146m OMNIPAQUE IOHEXOL 300 MG/ML SOLN, additional oral enteric contrast COMPARISON:  CT chest, 10/04/2021, CT abdomen pelvis, 09/21/2021 FINDINGS: CT CHEST FINDINGS Cardiovascular: Right chest port catheter. Scattered aortic atherosclerosis. Normal heart size. No pericardial effusion. Mediastinum/Nodes: No enlarged mediastinal, hilar, or axillary lymph nodes. Thyroid gland, trachea, and esophagus demonstrate no significant  findings. Lungs/Pleura: Numerous new and enlarged pulmonary nodules throughout the lungs, the majority of which clustered and concentrated in bronchiolar distributions in the anterior left upper lobe, peripheral right lower lobe, and lateral segment right middle lobe (series 508, image 41, 58). Index nodule in the anterior left upper lobe measures 1.2 x 0.8 cm, previously 0.8 x 0.6 cm (series 5, image 47). New, branching nodular opacity in the lateral segment right middle lobe measures 1.7 x 0.8 cm (series 5, image 58), most likely reflecting some combination of endobronchial nodule and inspissation. No pleural effusion or pneumothorax. Musculoskeletal: No chest wall mass or suspicious osseous lesions identified. CT ABDOMEN PELVIS FINDINGS Hepatobiliary: New hypodense lesion of the posterior left lobe of the liver, hepatic segment III, measuring 2.2 x 2.1 cm (series 11, image 100) as well as of the anterior right lobe of the liver, hepatic segment V/VIII, measuring 1.2 x 1.0 cm (series 11, image 88). No gallstones, gallbladder wall thickening, or biliary dilatation. Pancreas: Unremarkable. No pancreatic ductal dilatation or surrounding inflammatory changes. Spleen: Normal in size without significant abnormality. Adrenals/Urinary Tract: Adrenal glands are unremarkable. Status post right nephrectomy. Significant interval increase in now relatively bulky hypodense nodularity throughout the right retroperitoneum, hepatorenal recess, and right paracolic gutter, index nodule in the high hepatorenal recess measuring 5.1 x 4.6 cm, previously 3.4 x 2.5 cm (series 11, image 99). Index nodule in the right retroperitoneum measures 4.0 x 4.0 cm, previously 2.1 x 1.7 cm (series 11, image 131). The left kidney is normal, without renal calculi, solid lesion, or hydronephrosis. Bladder is unremarkable. Stomach/Bowel: Stomach is within normal limits. Appendix appears normal. No evidence of bowel wall thickening, distention, or  inflammatory changes. Vascular/Lymphatic: No significant vascular findings are present. No enlarged abdominal or pelvic lymph nodes. Reproductive: No mass or other abnormality. Other: No abdominal wall hernia or abnormality. No ascites. Musculoskeletal: No acute osseous findings. IMPRESSION: 1. Numerous new and enlarged pulmonary nodules throughout the lungs, the majority of which are clustered and concentrated in bronchiolar distributions. Findings are consistent with worsened pulmonary metastatic disease. 2. Significant interval increase in now relatively bulky hypodense nodularity throughout the right retroperitoneum, hepatorenal recess, and right paracolic gutter, consistent with worsened soft tissue metastatic disease. 3. New hypodense liver lesions, consistent with new hepatic metastatic disease. 4. Status post right nephrectomy and left mastectomy. Aortic Atherosclerosis (ICD10-I70.0). Electronically Signed   By: ADelanna AhmadiM.D.   On: 02/17/2022 17:21   CT CHEST W CONTRAST  Result Date: 02/17/2022 CLINICAL DATA:  Metastatic renal cell carcinoma, status post right nephrectomy, ongoing chemotherapy, additional history of left breast Bush status post mastectomy * Tracking Code: BO * EXAM: CT CHEST WITH CONTRAST CT ABDOMEN AND PELVIS WITH AND WITHOUT CONTRAST TECHNIQUE: Multidetector CT imaging of the chest was performed during intravenous contrast administration. Multidetector CT imaging of the abdomen and pelvis was performed following the standard protocol before and during bolus administration of intravenous contrast. RADIATION DOSE REDUCTION: This exam was performed according to the departmental dose-optimization program which includes automated exposure control, adjustment of the mA and/or kV according  to patient size and/or use of iterative reconstruction technique. CONTRAST:  129m OMNIPAQUE IOHEXOL 300 MG/ML SOLN, additional oral enteric contrast COMPARISON:  CT chest, 10/04/2021, CT abdomen  pelvis, 09/21/2021 FINDINGS: CT CHEST FINDINGS Cardiovascular: Right chest port catheter. Scattered aortic atherosclerosis. Normal heart size. No pericardial effusion. Mediastinum/Nodes: No enlarged mediastinal, hilar, or axillary lymph nodes. Thyroid gland, trachea, and esophagus demonstrate no significant findings. Lungs/Pleura: Numerous new and enlarged pulmonary nodules throughout the lungs, the majority of which clustered and concentrated in bronchiolar distributions in the anterior left upper lobe, peripheral right lower lobe, and lateral segment right middle lobe (series 508, image 41, 58). Index nodule in the anterior left upper lobe measures 1.2 x 0.8 cm, previously 0.8 x 0.6 cm (series 5, image 47). New, branching nodular opacity in the lateral segment right middle lobe measures 1.7 x 0.8 cm (series 5, image 58), most likely reflecting some combination of endobronchial nodule and inspissation. No pleural effusion or pneumothorax. Musculoskeletal: No chest wall mass or suspicious osseous lesions identified. CT ABDOMEN PELVIS FINDINGS Hepatobiliary: New hypodense lesion of the posterior left lobe of the liver, hepatic segment III, measuring 2.2 x 2.1 cm (series 11, image 100) as well as of the anterior right lobe of the liver, hepatic segment V/VIII, measuring 1.2 x 1.0 cm (series 11, image 88). No gallstones, gallbladder wall thickening, or biliary dilatation. Pancreas: Unremarkable. No pancreatic ductal dilatation or surrounding inflammatory changes. Spleen: Normal in size without significant abnormality. Adrenals/Urinary Tract: Adrenal glands are unremarkable. Status post right nephrectomy. Significant interval increase in now relatively bulky hypodense nodularity throughout the right retroperitoneum, hepatorenal recess, and right paracolic gutter, index nodule in the high hepatorenal recess measuring 5.1 x 4.6 cm, previously 3.4 x 2.5 cm (series 11, image 99). Index nodule in the right retroperitoneum  measures 4.0 x 4.0 cm, previously 2.1 x 1.7 cm (series 11, image 131). The left kidney is normal, without renal calculi, solid lesion, or hydronephrosis. Bladder is unremarkable. Stomach/Bowel: Stomach is within normal limits. Appendix appears normal. No evidence of bowel wall thickening, distention, or inflammatory changes. Vascular/Lymphatic: No significant vascular findings are present. No enlarged abdominal or pelvic lymph nodes. Reproductive: No mass or other abnormality. Other: No abdominal wall hernia or abnormality. No ascites. Musculoskeletal: No acute osseous findings. IMPRESSION: 1. Numerous new and enlarged pulmonary nodules throughout the lungs, the majority of which are clustered and concentrated in bronchiolar distributions. Findings are consistent with worsened pulmonary metastatic disease. 2. Significant interval increase in now relatively bulky hypodense nodularity throughout the right retroperitoneum, hepatorenal recess, and right paracolic gutter, consistent with worsened soft tissue metastatic disease. 3. New hypodense liver lesions, consistent with new hepatic metastatic disease. 4. Status post right nephrectomy and left mastectomy. Aortic Atherosclerosis (ICD10-I70.0). Electronically Signed   By: ADelanna AhmadiM.D.   On: 02/17/2022 17:21     ASSESSMENT:  Metastatic clear-cell renal cell carcinoma: - Right radical nephrectomy on 02/06/2021. - Pathology shows clear-cell RCC, grade 4, 12.5 cm, necrosis and rhabdoid features are identified.  Tumor extends into the and invades the wall of the vena cava (pT3c).  Vascular, ureteral and all margins of resection are negative for tumor. - CT scan abdomen with and without contrast on 05/29/2021 showed several nodules along the peritoneal surface of the right nephrectomy bed warranting close attention. - CTAP with and without contrast on 09/18/2021 showed progressive nodularity within the right nephrectomy bed, with enlarged nodules adjacent to the  right hepatic lobe extending inferiorly along the right retroperitoneum with multiple  enlarged enhancing nodules.  Direct metastatic invasion into the right hepatic lobe.  Small nodule at the right lung base favored to be benign. - IMDC: Intermediate risk group with 2 features including diagnosis less than 1 year and hemoglobin lower than normal. - Bone scan on 10/05/2021 with focal activity in the mental vertex of the mandible which could be inflammatory/traumatic/metastatic. - CT chest on 10/03/2021 showed several lung nodules scattered bilaterally some of which could be metastatic and many others could be due to mucoid impaction. - NGS testing did not show any targetable mutations.  TMB was low.  CCND3 amplified.  PBRM1 VUS present. - Right nephrectomy bed biopsy (10/22/2021): Clear-cell renal cell carcinoma with necrosis - 4 cycles of ipilimumab and nivolumab from 11/19/2021 through 01/24/2022 - CT CAP on 02/14/2022: Numerous new and enlarged lung nodules.  Significant interval increase in the mass in the right renal bed.  New hypodense liver lesions.    Social/family history: - She lives at home with her husband and also takes care of her mother. - She works as a Buyer, retail at [employer].  She quit smoking in 2004 and smoked half pack per day for 10 years. - Half sister (same mother) had kidney Bush at age 52.  Maternal grandfather had kidney Bush.  Sister died of lung Bush.  2 half sisters (same father) had breast cancers.  3.  Left breast stage I tubular adenocarcinoma: - She underwent left mastectomy on 06/11/2002.  0/6 lymph nodes positive.  ER 90%, PR 92%, Ki-67 6%, HER2 negative.  As per chart, declined antiestrogen therapy.   PLAN:  Metastatic clear-cell RCC, intermediate risk by IMDC: - She has completed 4 cycles of double immunotherapy. - She has right loin pain which is manageable without pain medication. - We reviewed images of the CT scan which showed  progression of disease. - I have recommended change in therapy to combination of lenvatinib and everolimus given higher response rates. - We will start her on lower dose at lenvatinib 12 mg daily and everolimus 5 mg daily. - I plan to titrate the dose as tolerated. - We discussed side effects in detail including but not limited to hypertension, fluid retention, electrolyte abnormalities, palmar plantar erythrodysesthesia, skin rashes and fatigue. - If the fatigue gets worse, she will come off of work. - I plan to see her back 2 weeks after starting combination medication with repeat labs.  2.  Normocytic anemia: - She is taking iron tablet daily.  Hemoglobin today is 8.8. - We will plan on repeating iron panel in the future.   Orders placed this encounter:  No orders of the defined types were placed in this encounter.    Derek Jack, MD [place] [phone]   I, Thana Ates, am acting as a scribe for Dr. Derek Jack.  I, Derek Jack MD, have reviewed the above documentation for accuracy and completeness, and I agree with the above.

## 2022-02-21 NOTE — Progress Notes (Signed)
No treatment today per Dr. Katragadda.  Port flushed with good blood return noted. No bruising or swelling at site. Bandaid applied and patient discharged in satisfactory condition. VVS stable with no signs or symptoms of distressed noted.  °

## 2022-02-21 NOTE — Patient Instructions (Signed)
[facility]  Discharge Instructions: Thank you for choosing [facility] to provide your oncology and hematology care.  If you have a lab appointment with the Cancer Center, please come in thru the Main Entrance and check in at the main information desk.  Wear comfortable clothing and clothing appropriate for easy access to any Portacath or PICC line.   We strive to give you quality time with your provider. You may need to reschedule your appointment if you arrive late (15 or more minutes).  Arriving late affects you and other patients whose appointments are after yours.  Also, if you miss three or more appointments without notifying the office, you may be dismissed from the clinic at the provider's discretion.      For prescription refill requests, have your pharmacy contact our office and allow 72 hours for refills to be completed.    Today your port was flushed and labs were drawn, return as scheduled.   To help prevent nausea and vomiting after your treatment, we encourage you to take your nausea medication as directed.  BELOW ARE SYMPTOMS THAT SHOULD BE REPORTED IMMEDIATELY: *FEVER GREATER THAN 100.4 F (38 C) OR HIGHER *CHILLS OR SWEATING *NAUSEA AND VOMITING THAT IS NOT CONTROLLED WITH YOUR NAUSEA MEDICATION *UNUSUAL SHORTNESS OF BREATH *UNUSUAL BRUISING OR BLEEDING *URINARY PROBLEMS (pain or burning when urinating, or frequent urination) *BOWEL PROBLEMS (unusual diarrhea, constipation, pain near the anus) TENDERNESS IN MOUTH AND THROAT WITH OR WITHOUT PRESENCE OF ULCERS (sore throat, sores in mouth, or a toothache) UNUSUAL RASH, SWELLING OR PAIN  UNUSUAL VAGINAL DISCHARGE OR ITCHING   Items with * indicate a potential emergency and should be followed up as soon as possible or go to the Emergency Department if any problems should occur.  Please show the CHEMOTHERAPY ALERT CARD or IMMUNOTHERAPY ALERT CARD at check-in to the Emergency Department and triage  nurse.  Should you have questions after your visit or need to cancel or reschedule your appointment, please contact [facility] [phone]  and follow the prompts.  Office hours are 8:00 a.m. to 4:30 p.m. Monday - Friday. Please note that voicemails left after 4:00 p.m. may not be returned until the following business day.  We are closed weekends and major holidays. You have access to a nurse at all times for urgent questions. Please call the main number to the clinic [phone] and follow the prompts.  For any non-urgent questions, you may also contact your provider using MyChart. We now offer e-Visits for anyone 18 and older to request care online for non-urgent symptoms. For details visit [email or web address].   Also download the MyChart app! Go to the app store, search "MyChart", open the app, select [facility], and log in with your MyChart username and password.  Due to Covid, a mask is required upon entering the hospital/clinic. If you do not have a mask, one will be given to you upon arrival. For doctor visits, patients may have 1 support person aged 18 or older with them. For treatment visits, patients cannot have anyone with them due to current Covid guidelines and our immunocompromised population.  

## 2022-02-22 ENCOUNTER — Other Ambulatory Visit (HOSPITAL_COMMUNITY): Payer: Self-pay

## 2022-02-22 ENCOUNTER — Telehealth (HOSPITAL_COMMUNITY): Payer: Self-pay | Admitting: Pharmacist

## 2022-02-22 DIAGNOSIS — C641 Malignant neoplasm of right kidney, except renal pelvis: Secondary | ICD-10-CM

## 2022-02-22 MED ORDER — LENVIMA (12 MG DAILY DOSE) 3 X 4 MG PO CPPK
12.0000 mg | ORAL_CAPSULE | Freq: Every day | ORAL | 0 refills | Status: DC
  Filled 2022-02-22: qty 90, 90d supply, fill #0

## 2022-02-22 NOTE — Telephone Encounter (Signed)
Oral Oncology Pharmacist Encounter  Received new prescription for LEnvima (lenvatinib) and Afinitor (everolimus) for the treatment of progressive metasatic clear-cell RCC, planned duration until disease progression or unacceptable drug toxicity.  CMP from 02/21/22 assessed, no relevant lab abnormalities. Prescription dose and frequency assessed.   Current medication list in Epic reviewed, no relevant DDIs with lenvatinib or everolimus identified.  Evaluated chart and no patient barriers to medication adherence identified.   Prescription has been e-scribed to the [facility] for benefits analysis and approval.  Oral Oncology Clinic will continue to follow for insurance authorization, copayment issues, initial counseling and start date.   Darl Pikes, PharmD, BCPS, BCOP, CPP Hematology/Oncology Clinical Pharmacist Practitioner /DB/[facility] Oral [place] [phone]  02/22/2022 9:30 AM

## 2022-02-25 ENCOUNTER — Encounter (HOSPITAL_COMMUNITY): Payer: Self-pay | Admitting: Hematology

## 2022-02-25 ENCOUNTER — Other Ambulatory Visit (HOSPITAL_COMMUNITY): Payer: Self-pay

## 2022-02-25 ENCOUNTER — Telehealth (HOSPITAL_COMMUNITY): Payer: Self-pay | Admitting: Pharmacy Technician

## 2022-02-25 NOTE — Telephone Encounter (Signed)
Oral Oncology Patient Advocate Encounter  Called to initiate PA over the phone for Lenvima and Afinitor through [place] [employer] [phone]). Per rep, patients insurance does not cover any specialty drugs that cost over $1000.   I will reach out to the patient to apply for patient assistance through [facility] for [place] and [employer] for [employer].  [place] Patient [place] Phone [phone] Fax [phone] 02/25/2022 10:56 AM

## 2022-02-26 NOTE — Telephone Encounter (Signed)
Oral Oncology Patient Advocate Encounter  With the patients permission, I submitted an online application to Patient Assistance [facility]).  [facility] will complete a benefits investigation for Afinitor and review the application submitted to determine if the patient qualifies for [employer] Patient [place].  Medication: Afinitor Confirmation number: [id number]  I faxed the completed Health Care Provider form to [phone].  Patient is bringing income documents to [facility] on 5/24 and I will fax them to [facility] when received.  [facility] phone number is [phone].  [place] Patient [place] Phone [phone] Fax [phone] 02/26/2022 3:48 PM

## 2022-02-27 ENCOUNTER — Telehealth (HOSPITAL_COMMUNITY): Payer: Self-pay | Admitting: Pharmacy Technician

## 2022-03-01 LAB — T4, FREE: Free T4: 1.25 ng/dL (ref 0.82–1.77)

## 2022-03-01 NOTE — Telephone Encounter (Signed)
Oral Oncology Patient Advocate Encounter  Received notification from [place] [employer] that patient has been successfully enrolled into their program to receive Lenvima from the manufacturer at $0 out of pocket until 02/28/23.    I called and spoke with patient.  She knows we will have to re-apply.   Specialty Pharmacy that will dispense medication is [facility].  Patient knows to call the office with questions or concerns.   Oral Oncology Clinic will continue to follow.  [place] Patient [place] Phone [phone] Fax [phone] 03/01/2022 10:53 AM

## 2022-03-01 NOTE — Telephone Encounter (Signed)
Oral Oncology Patient Advocate Encounter  Spoke with patient over the phone and coordinated with her to complete application for [facility] Patient Assistance Program in an effort to reduce patient's out of pocket expense for Lenvima to $0.    Application completed and faxed to [phone] on 02/27/22.   [place] phone number for follow up is [phone].   This encounter will be updated until final determination.   [place] Patient [place] Phone [phone] Fax [phone] 03/01/2022 10:53 AM

## 2022-03-01 NOTE — Telephone Encounter (Signed)
Oral Oncology Patient Advocate Encounter  Patient is approved for 14 day free trial of Afinitor through [employer].

## 2022-03-07 ENCOUNTER — Encounter (HOSPITAL_COMMUNITY): Payer: Self-pay | Admitting: Hematology

## 2022-03-12 ENCOUNTER — Ambulatory Visit (INDEPENDENT_AMBULATORY_CARE_PROVIDER_SITE_OTHER): Payer: No Typology Code available for payment source | Admitting: "Endocrinology

## 2022-03-12 ENCOUNTER — Encounter: Payer: Self-pay | Admitting: "Endocrinology

## 2022-03-12 VITALS — BP 118/58 | HR 88 | Ht 66.0 in | Wt 177.6 lb

## 2022-03-12 DIAGNOSIS — E119 Type 2 diabetes mellitus without complications: Secondary | ICD-10-CM

## 2022-03-12 DIAGNOSIS — E782 Mixed hyperlipidemia: Secondary | ICD-10-CM

## 2022-03-12 DIAGNOSIS — E890 Postprocedural hypothyroidism: Secondary | ICD-10-CM | POA: Diagnosis not present

## 2022-03-12 LAB — POCT GLYCOSYLATED HEMOGLOBIN (HGB A1C): HbA1c, POC (controlled diabetic range): 6.6 % (ref 0.0–7.0)

## 2022-03-12 MED ORDER — LEVOTHYROXINE SODIUM 100 MCG PO TABS: 100.0000 ug | ORAL_TABLET | Freq: Every day | ORAL | 1 refills | Status: DC

## 2022-03-12 NOTE — Progress Notes (Signed)
03/12/2022, 2:39 PM  Endocrinology follow-up note   [name] is a 61 y.o.-year-old female patient being seen for follow-up after she was seen in consultation for hypothyroidism. PMD:  Deanna Helper, MD.   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    Cellulitis of leg, left    Complication of anesthesia    Hard to wake up   Diabetes mellitus without complication ([place])    Pt denies   Family history of breast cancer 08/16/2011   Family history of breast cancer    Family history of colon cancer    Family history of kidney cancer    Family history of prostate cancer    Hyperlipidemia    Hypertension    Hypothyroidism    MRSA (methicillin resistant staph aureus) culture positive 08/19/2011   Pre-diabetes     Past Surgical History:  Procedure Laterality Date   BREAST SURGERY Left    mastectomy   CESAREAN SECTION     x2   COLONOSCOPY N/A 03/03/2020   Procedure: COLONOSCOPY;  Surgeon: Daneil Dolin, MD;  Location[facility];  Service: Endoscopy;  Laterality: N/A;  12:00   IR IMAGING GUIDED PORT INSERTION  11/07/2021   left mastectomy     NEPHRECTOMY Right 02/06/2021   Procedure: NEPHRECTOMY- open radical;  Surgeon: Cleon Gustin, MD;  Location: [facility] ORS;  Service: Urology;  Laterality: Right;   POLYPECTOMY  03/03/2020   Procedure: POLYPECTOMY;  Surgeon: Daneil Dolin, MD;  Location: [facility];  Service: Endoscopy;;    Social History   Socioeconomic History   Marital status: Married    Spouse name: Not on file   Number of children: 2   Years of education: Not on file   Highest education level: Not on file  Occupational History   Occupation: CNA  Tobacco Use   Smoking status: Former    Packs/day: 0.50    Years: 18.00    Pack years: 9.00    Types: Cigarettes    Quit date: 07/01/2002    Years since quitting: 19.7   Smokeless tobacco:  Never  Vaping Use   Vaping Use: Never used  Substance and Sexual Activity   Alcohol use: No   Drug use: No   Sexual activity: Not Currently  Other Topics Concern   Not on file  Social History Narrative   Not on file   Social Determinants of Health   Financial Resource Strain: Not on file  Food Insecurity: Not on file  Transportation Needs: Not on file  Physical Activity: Not on file  Stress: Not on file  Social Connections: Not on file    Family History  Problem Relation Age of Onset   Hypertension Mother    Diabetes Mother    Hyperlipidemia Mother    Colon cancer Maternal Aunt        dx 44s   Prostate cancer Maternal Uncle    Throat cancer Maternal Uncle    Kidney cancer Maternal Grandfather  Lung cancer Half-Sister    Kidney cancer Half-Sister 25   Breast cancer Half-Sister 54   Breast cancer Half-Sister 66    Outpatient Encounter Medications as of 03/12/2022  Medication Sig   acetaminophen (TYLENOL) 500 MG tablet Take 500 mg by mouth every 6 (six) hours as needed for moderate pain or headache.   amLODipine (NORVASC) 5 MG tablet TAKE 1 TABLET (5 MG TOTAL) BY MOUTH DAILY.   atorvastatin (LIPITOR) 10 MG tablet TAKE ONE TABLET BY MOUTH ONCE DAILY   EPINEPHrine 0.3 mg/0.3 mL IJ SOAJ injection Inject 0.3 mg into the muscle as needed for anaphylaxis.   everolimus (AFINITOR) 5 MG tablet Take 1 tablet (5 mg total) by mouth daily.   Iron, Ferrous Sulfate, 325 (65 Fe) MG TABS Take 325 mg by mouth daily.   lenvatinib 12 mg daily dose (LENVIMA, 12 MG DAILY DOSE,) 3 x 4 MG capsule Take 12 mg by mouth daily.   levothyroxine (SYNTHROID) 100 MCG tablet Take 1 tablet (100 mcg total) by mouth daily before breakfast.   lidocaine-prilocaine (EMLA) cream Apply to affected area once   Multiple Vitamin (MULTIVITAMIN WITH MINERALS) TABS tablet Take 1 tablet by mouth daily.   prochlorperazine (COMPAZINE) 10 MG tablet Take 1 tablet (10 mg total) by mouth every 6 (six) hours as needed for  nausea or vomiting.   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   [DISCONTINUED] levothyroxine (SYNTHROID) 88 MCG tablet TAKE 1 TABLET(88 MCG) BY MOUTH DAILY BEFORE BREAKFAST   No facility-administered encounter medications on file as of 03/12/2022.    ALLERGIES: Allergies  Allergen Reactions   Sulfonamide Derivatives Itching   Yellow Jacket Venom [Bee Venom] Rash    urticaria   VACCINATION STATUS: Immunization History  Administered Date(s) Administered   Influenza Split 08/06/2012   Influenza,inj,Quad PF,6+ Mos 07/22/2018, 06/28/2019, 06/28/2020, 07/10/2021   Moderna Sars-Covid-2 Vaccination 12/07/2019, 01/07/2020, 08/09/2020, 04/05/2021   Pneumococcal Conjugate-13 03/29/2015   Pneumococcal Polysaccharide-23 04/18/2021   Tdap 03/12/2011, 07/12/2021   Zoster Recombinat (Shingrix) 05/05/2018, 07/02/2018     HPI    Deanna Bush  is a patient with the above medical history.  Her history starts at approximate age of 39 when she had Graves' disease which required RAI thyroid ablation.  She is currently on levothyroxine 88 mcg p.o. daily before breakfast.      She reports compliance to his medication.  She has no new complaints today.  Since her last visit, she underwent right nephrectomy for renal cell carcinoma.  Her previsit thyroid function tests are consistent with appropriate replacement.     She denies fatigue, palpitations, tremors, heat/cold intolerance. -She reports fluctuating body weight. -She denies dysphagia, shortness of breath, no voice change.  She underwent thyroid ultrasound recently which did not show significant goiter , nor any discrete thyroid nodules.    she has family history of  thyroid disorders in her mother, but no family history of thyroid cancer.  -She exercises regularly, non-smoker.  Her medical history also includes hyperlipidemia for which she is taking atorvastatin 10 mg p.o.  nightly. -She has history of diet/exercise controlled type 2 diabetes with recent A1c of 6.6%.     She also has hyperlipidemia on management with PMD.   ROS:  Limited as above.   Physical Exam: BP (!) 118/58   Pulse 88   Ht '5\' 6"'$  (1.676 m)   Wt 177 lb 9.6 oz (80.6 kg)  BMI 28.67 kg/m  Wt Readings from Last 3 Encounters:  03/12/22 177 lb 9.6 oz (80.6 kg)  02/21/22 187 lb 3.2 oz (84.9 kg)  01/24/22 189 lb (85.7 kg)    Constitutional:  Body mass index is 28.67 kg/m., not in acute distress, normal state of mind   CMP ( most recent) CMP     Component Value Date/Time   NA 137 02/21/2022 0851   NA 138 07/10/2021 1530   K 4.1 02/21/2022 0851   CL 104 02/21/2022 0851   CO2 26 02/21/2022 0851   GLUCOSE 112 (H) 02/21/2022 0851   BUN 15 02/21/2022 0851   BUN 18 07/10/2021 1530   CREATININE 1.03 (H) 02/21/2022 0851   CREATININE 0.73 04/22/2019 0848   CALCIUM 9.1 02/21/2022 0851   PROT 7.9 02/21/2022 0851   PROT 7.4 07/10/2021 1530   ALBUMIN 3.4 (L) 02/21/2022 0851   ALBUMIN 4.4 07/10/2021 1530   AST 20 02/21/2022 0851   ALT 14 02/21/2022 0851   ALKPHOS 118 02/21/2022 0851   BILITOT 0.2 (L) 02/21/2022 0851   BILITOT 0.3 07/10/2021 1530   GFRNONAA >60 02/21/2022 0851   GFRNONAA 90 04/22/2019 0848   GFRAA 96 10/30/2020 0914   GFRAA 104 04/22/2019 0848     Diabetic Labs (most recent): Lab Results  Component Value Date   HGBA1C 6.6 03/12/2022   HGBA1C 6.7 (H) 07/10/2021   HGBA1C 6.6 (H) 02/02/2021     Lipid Panel ( most recent) Lipid Panel     Component Value Date/Time   CHOL 175 07/10/2021 1530   TRIG 76 07/10/2021 1530   HDL 60 07/10/2021 1530   CHOLHDL 2.9 07/10/2021 1530   CHOLHDL 2.7 10/23/2018 1148   VLDL 7 08/12/2016 0913   LDLCALC 101 (H) 07/10/2021 1530   LDLCALC 100 (H) 10/23/2018 1148   LABVLDL 14 07/10/2021 1530       Lab Results  Component Value Date   TSH 6.639 (H) 02/21/2022   TSH 3.486 01/24/2022   TSH 3.706 01/03/2022   TSH 2.455  12/13/2021   TSH 1.281 11/19/2021   TSH 0.980 08/28/2021   TSH 0.657 10/30/2020   TSH 1.060 09/04/2020   TSH 8.37 (H) 03/28/2020   TSH 30.55 (H) 01/03/2020   FREET4 1.25 02/28/2022   FREET4 1.44 08/28/2021   FREET4 1.30 09/04/2020   FREET4 1.1 03/28/2020   FREET4 0.7 (L) 01/03/2020       ASSESSMENT: 1. Hypothyroidism   2.  Type 2 diabetes  3.  Hyperlipidemia  PLAN:  -Her previsit thyroid function tests are consistent with slight under replacement.  I discussed and increased her levothyroxine to 100 mcg p.o. daily before breakfast.     - We discussed about the correct intake of her thyroid hormone, on empty stomach at fasting, with water, separated by at least 30 minutes from breakfast and other medications,  and separated by more than 4 hours from calcium, iron, multivitamins, acid reflux medications (PPIs). -Patient is made aware of the fact that thyroid hormone replacement is needed for life, dose to be adjusted by periodic monitoring of thyroid function tests.   -Her recent thyroid ultrasound was reviewed, no goiter, no discrete nodules. -She has controlled type 2 diabetes with point-of-care A1c today at 6.6%.    She is not on medications.  She promises to continue to engage in lifestyle changes.     She will have repeat A1c during her next visit.  She is advised to continue her atorvastatin and milligrams p.o.  nightly. She is advised to maintain close follow-up with her PMD Dr. Tula Nakayama.  For her hyperlipidemia, she is on atorvastatin 10 mg p.o. nightly.  She will be considered for repeat fasting lipid panel on subsequent visits.   I spent 30 minutes in the care of the patient today including review of labs from Thyroid Function, CMP, and other relevant labs ; imaging/biopsy records (current and previous including abstractions from other facilities); face-to-face time discussing  her lab results and symptoms, medications doses, her options of short and long term treatment  based on the latest standards of care / guidelines;   and documenting the encounter.  [name]  participated in the discussions, expressed understanding, and voiced agreement with the above plans.  All questions were answered to her satisfaction. she is encouraged to contact clinic should she have any questions or concerns prior to her return visit.     Return in about 4 months (around 07/12/2022) for F/U with Pre-visit Labs, A1c -NV.  Glade Lloyd, MD [facility] [address] [place], [place] [address] Phone: [phone]  Fax: [phone]   03/12/2022, 2:39 PM  This note was partially dictated with voice recognition software. Similar sounding words can be transcribed inadequately or may not  be corrected upon review.

## 2022-03-12 NOTE — Patient Instructions (Signed)

## 2022-03-15 NOTE — Telephone Encounter (Signed)
Oral Oncology Patient Advocate Encounter  Received notification from [employer] that patient has been successfully enrolled into their program to receive Afinitor from the manufacturer at $0 out of pocket until 10/06/22.    I called and left patient a voicemail letter her know of the approval.   Specialty Pharmacy that will dispense medication is [facility].  Patient knows to call the office with questions or concerns.   Oral Oncology Clinic will continue to follow.  [place] Patient [place] Phone [phone] Fax [phone] 03/15/2022 12:21 PM

## 2022-03-18 ENCOUNTER — Inpatient Hospital Stay (HOSPITAL_COMMUNITY): Payer: No Typology Code available for payment source

## 2022-03-18 ENCOUNTER — Inpatient Hospital Stay (HOSPITAL_COMMUNITY): Payer: No Typology Code available for payment source | Attending: Hematology | Admitting: Hematology

## 2022-03-18 DIAGNOSIS — Z79899 Other long term (current) drug therapy: Secondary | ICD-10-CM | POA: Diagnosis not present

## 2022-03-18 DIAGNOSIS — E039 Hypothyroidism, unspecified: Secondary | ICD-10-CM | POA: Insufficient documentation

## 2022-03-18 DIAGNOSIS — C787 Secondary malignant neoplasm of liver and intrahepatic bile duct: Secondary | ICD-10-CM | POA: Diagnosis not present

## 2022-03-18 DIAGNOSIS — Z800 Family history of malignant neoplasm of digestive organs: Secondary | ICD-10-CM | POA: Insufficient documentation

## 2022-03-18 DIAGNOSIS — D649 Anemia, unspecified: Secondary | ICD-10-CM | POA: Diagnosis not present

## 2022-03-18 DIAGNOSIS — E119 Type 2 diabetes mellitus without complications: Secondary | ICD-10-CM | POA: Diagnosis not present

## 2022-03-18 DIAGNOSIS — C641 Malignant neoplasm of right kidney, except renal pelvis: Secondary | ICD-10-CM

## 2022-03-18 DIAGNOSIS — Z853 Personal history of malignant neoplasm of breast: Secondary | ICD-10-CM | POA: Diagnosis not present

## 2022-03-18 DIAGNOSIS — Z905 Acquired absence of kidney: Secondary | ICD-10-CM | POA: Diagnosis not present

## 2022-03-18 DIAGNOSIS — Z87891 Personal history of nicotine dependence: Secondary | ICD-10-CM | POA: Diagnosis not present

## 2022-03-18 DIAGNOSIS — Z803 Family history of malignant neoplasm of breast: Secondary | ICD-10-CM | POA: Diagnosis not present

## 2022-03-18 DIAGNOSIS — I10 Essential (primary) hypertension: Secondary | ICD-10-CM | POA: Diagnosis not present

## 2022-03-18 DIAGNOSIS — Z8051 Family history of malignant neoplasm of kidney: Secondary | ICD-10-CM | POA: Insufficient documentation

## 2022-03-18 DIAGNOSIS — Z8042 Family history of malignant neoplasm of prostate: Secondary | ICD-10-CM | POA: Insufficient documentation

## 2022-03-18 DIAGNOSIS — Z801 Family history of malignant neoplasm of trachea, bronchus and lung: Secondary | ICD-10-CM | POA: Insufficient documentation

## 2022-03-18 LAB — CBC WITH DIFFERENTIAL/PLATELET
Abs Immature Granulocytes: 0.02 10*3/uL (ref 0.00–0.07)
Basophils Absolute: 0 10*3/uL (ref 0.0–0.1)
Basophils Relative: 0 %
Eosinophils Absolute: 0.1 10*3/uL (ref 0.0–0.5)
Eosinophils Relative: 2 %
HCT: 28.4 % — ABNORMAL LOW (ref 36.0–46.0)
Hemoglobin: 8.9 g/dL — ABNORMAL LOW (ref 12.0–15.0)
Immature Granulocytes: 0 %
Lymphocytes Relative: 34 %
Lymphs Abs: 2 10*3/uL (ref 0.7–4.0)
MCH: 25.7 pg — ABNORMAL LOW (ref 26.0–34.0)
MCHC: 31.3 g/dL (ref 30.0–36.0)
MCV: 82.1 fL (ref 80.0–100.0)
Monocytes Absolute: 0.3 10*3/uL (ref 0.1–1.0)
Monocytes Relative: 4 %
Neutro Abs: 3.6 10*3/uL (ref 1.7–7.7)
Neutrophils Relative %: 60 %
Platelets: 454 10*3/uL — ABNORMAL HIGH (ref 150–400)
RBC: 3.46 MIL/uL — ABNORMAL LOW (ref 3.87–5.11)
RDW: 15.8 % — ABNORMAL HIGH (ref 11.5–15.5)
WBC: 6 10*3/uL (ref 4.0–10.5)
nRBC: 0 % (ref 0.0–0.2)

## 2022-03-18 LAB — COMPREHENSIVE METABOLIC PANEL
ALT: 40 U/L (ref 0–44)
AST: 40 U/L (ref 15–41)
Albumin: 2.8 g/dL — ABNORMAL LOW (ref 3.5–5.0)
Alkaline Phosphatase: 270 U/L — ABNORMAL HIGH (ref 38–126)
Anion gap: 8 (ref 5–15)
BUN: 10 mg/dL (ref 8–23)
CO2: 23 mmol/L (ref 22–32)
Calcium: 8.7 mg/dL — ABNORMAL LOW (ref 8.9–10.3)
Chloride: 101 mmol/L (ref 98–111)
Creatinine, Ser: 0.97 mg/dL (ref 0.44–1.00)
GFR, Estimated: >60 mL/min (ref >60)
Glucose, Bld: 165 mg/dL — ABNORMAL HIGH (ref 70–99)
Potassium: 3.8 mmol/L (ref 3.5–5.1)
Sodium: 132 mmol/L — ABNORMAL LOW (ref 135–145)
Total Bilirubin: 0.4 mg/dL (ref 0.3–1.2)
Total Protein: 7.7 g/dL (ref 6.5–8.1)

## 2022-03-18 LAB — TSH: TSH: 10.365 u[IU]/mL — ABNORMAL HIGH (ref 0.350–4.500)

## 2022-03-18 LAB — MAGNESIUM: Magnesium: 2.1 mg/dL (ref 1.7–2.4)

## 2022-03-18 MED ORDER — SODIUM CHLORIDE 0.9% FLUSH
10.0000 mL | Freq: Once | INTRAVENOUS | Status: AC
  Administered 2022-03-18: 10 mL via INTRAVENOUS

## 2022-03-18 MED ORDER — HEPARIN SOD (PORK) LOCK FLUSH 100 UNIT/ML IV SOLN
500.0000 [IU] | Freq: Once | INTRAVENOUS | Status: AC
  Administered 2022-03-18: 500 [IU] via INTRAVENOUS

## 2022-03-18 NOTE — Progress Notes (Signed)
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  TMB was low.  CCND3 amplified.  PBRM1 VUS present.  CURRENT THERAPY: Nivolumab + Ipilimumab q21d / Nivolumab q28d  BRIEF ONCOLOGIC HISTORY:  Oncology History  Renal cell cancer ([place])  06/20/2021 Initial Diagnosis   Renal cell carcinoma of right kidney ([place])   11/19/2021 -  Chemotherapy   Patient is on Treatment Plan : RENAL CELL CARCINOMA Nivolumab + Ipilimumab q21d / Nivolumab q28d      Genetic Testing   Negative genetic testing. No pathogenic variants identified on the [facility] Multi-Cancer Panel + RNA. VUS in PMS2 called c.1732C>A and in RECQL4 called c.2967G>A  Identified. The report date is 11/01/2021.   The Multi-Cancer Panel + RNA offered by [facility] includes sequencing and/or deletion duplication testing of the following 84 genes: AIP, ALK, APC, ATM, AXIN2,BAP1,  BARD1, BLM, BMPR1A, BRCA1, BRCA2, BRIP1, CASR, CDC73, CDH1, CDK4, CDKN1B, CDKN1C, CDKN2A (p14ARF), CDKN2A (p16INK4a), CEBPA, CHEK2, CTNNA1, DICER1, DIS3L2, EGFR (c.2369C>T, p.Thr790Met variant only), EPCAM (Deletion/duplication testing only), FH, FLCN, GATA2, GPC3, GREM1 (Promoter region deletion/duplication testing only), HOXB13 (c.251G>A, p.Gly84Glu), HRAS, KIT, MAX, MEN1, MET, MITF (c.952G>A, p.Glu318Lys variant only), MLH1, MSH2, MSH3, MSH6, MUTYH, NBN, NF1, NF2, NTHL1, PALB2, PDGFRA, PHOX2B, PMS2, POLD1, POLE, POT1, PRKAR1A, PTCH1, PTEN, RAD50, RAD51C, RAD51D, RB1, RECQL4, RET, RUNX1, SDHAF2, SDHA (sequence changes only), SDHB, SDHC, SDHD, SMAD4, SMARCA4, SMARCB1, SMARCE1, STK11, SUFU, TERC, TERT, TMEM127, TP53, TSC1, TSC2, VHL, WRN and WT1.      CANCER  STAGING: Cancer Staging  Malignant neoplasm of female breast [facility]) Staging form: Breast, AJCC 6th Edition - Clinical: Stage I (T1b, N0, M0) - Signed by Baird Cancer, PA on 08/16/2011  Renal cell cancer [facility]) Staging form: Kidney, AJCC 8th Edition - Clinical stage from 09/26/2021: Stage IV (cT3c, cNX, cM1) - Unsigned   INTERVAL HISTORY:  [name], a 61 y.o. female, returns for routine follow-up of her metastatic clear-cell renal cell carcinoma. Ritha was last seen on 02/21/2022.   Today she reports feeling good. She started lenvatinib on 6/7. Starting on 6/9 lenvatinib she reports severe nausea and was unable to eat solid food, and she reports today is the first she has not had nausea. She reports fatigue since starting lenvatinib  which has been improving as of yesterday. She denies sores in her mouth, palms, or soles. Her right lower back pain has improved.   REVIEW OF SYSTEMS:  Review of Systems  Constitutional:  Positive for fatigue (improving). Negative for appetite change.  HENT:   Negative for mouth sores.   Gastrointestinal:  Positive for abdominal pain (5/10 R side) and constipation. Negative for nausea (resolved).  All other systems reviewed and are negative.   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[place])    left    Cellulitis of leg, left    Complication of anesthesia    Hard to wake up   Diabetes mellitus without complication ([place])    Pt denies   Family history of breast cancer 08/16/2011   Family history of breast cancer    Family history of colon cancer    Family history of kidney  cancer    Family history of prostate cancer    Hyperlipidemia    Hypertension    Hypothyroidism    MRSA (methicillin resistant staph aureus) culture positive 08/19/2011   Pre-diabetes    Past Surgical History:  Procedure Laterality Date   BREAST SURGERY Left    mastectomy   CESAREAN SECTION     x2    COLONOSCOPY N/A 03/03/2020   Procedure: COLONOSCOPY;  Surgeon: Daneil Dolin, MD;  Location: [facility];  Service: Endoscopy;  Laterality: N/A;  12:00   IR IMAGING GUIDED PORT INSERTION  11/07/2021   left mastectomy     NEPHRECTOMY Right 02/06/2021   Procedure: NEPHRECTOMY- open radical;  Surgeon: Cleon Gustin, MD;  Location: [facility];  Service: Urology;  Laterality: Right;   POLYPECTOMY  03/03/2020   Procedure: POLYPECTOMY;  Surgeon: Daneil Dolin, MD;  Location: [facility];  Service: Endoscopy;;    SOCIAL HISTORY:  Social History   Socioeconomic History   Marital status: Married    Spouse name: Not on file   Number of children: 2   Years of education: Not on file   Highest education level: Not on file  Occupational History   Occupation: CNA  Tobacco Use   Smoking status: Former    Packs/day: 0.50    Years: 18.00    Total pack years: 9.00    Types: Cigarettes    Quit date: 07/01/2002    Years since quitting: 19.7   Smokeless tobacco: Never  Vaping Use   Vaping Use: Never used  Substance and Sexual Activity   Alcohol use: No   Drug use: No   Sexual activity: Not Currently  Other Topics Concern   Not on file  Social History Narrative   Not on file   Social Determinants of Health   Financial Resource Strain: Not on file  Food Insecurity: Not on file  Transportation Needs: Not on file  Physical Activity: Not on file  Stress: Not on file  Social Connections: Not on file  Intimate Partner Violence: Not on file    FAMILY HISTORY:  Family History  Problem Relation Age of Onset   Hypertension Mother    Diabetes Mother    Hyperlipidemia Mother    Colon cancer Maternal Aunt        dx 73s   Prostate cancer Maternal Uncle    Throat cancer Maternal Uncle    Kidney cancer Maternal Grandfather    Lung cancer Half-Sister    Kidney cancer Half-Sister 43   Breast cancer Half-Sister 28   Breast cancer Half-Sister 78    CURRENT MEDICATIONS:  Current  Outpatient Medications  Medication Sig Dispense Refill   acetaminophen (TYLENOL) 500 MG tablet Take 500 mg by mouth every 6 (six) hours as needed for moderate pain or headache.     amLODipine (NORVASC) 5 MG tablet TAKE 1 TABLET (5 MG TOTAL) BY MOUTH DAILY. 90 tablet 3   atorvastatin (LIPITOR) 10 MG tablet TAKE ONE TABLET BY MOUTH ONCE DAILY 90 tablet 3   EPINEPHrine 0.3 mg/0.3 mL IJ SOAJ injection Inject 0.3 mg into the muscle as needed for anaphylaxis. 2 each 0   everolimus (AFINITOR) 5 MG tablet Take 1 tablet (5 mg total) by mouth daily. 30 tablet 0   Iron, Ferrous Sulfate, 325 (65 Fe) MG TABS Take 325 mg by mouth daily. 30 tablet 3   lenvatinib 12 mg daily dose (LENVIMA, 12 MG DAILY DOSE,) 3 x 4 MG capsule Take  12 mg by mouth daily. 90 capsule 0   levothyroxine (SYNTHROID) 100 MCG tablet Take 1 tablet (100 mcg total) by mouth daily before breakfast. 90 tablet 1   lidocaine-prilocaine (EMLA) cream Apply to affected area once 30 g 3   Multiple Vitamin (MULTIVITAMIN WITH MINERALS) TABS tablet Take 1 tablet by mouth daily.     prochlorperazine (COMPAZINE) 10 MG tablet Take 1 tablet (10 mg total) by mouth every 6 (six) hours as needed for nausea or vomiting. 30 tablet 6   UNABLE TO FIND MASTECTOMY BRA AND PROTHESIS DX Z90.12 6 each 2   UNABLE TO FIND 6 mastectomy prosthesis and bras. 6 each 0   No current facility-administered medications for this visit.    ALLERGIES:  Allergies  Allergen Reactions   Sulfonamide Derivatives Itching   Yellow Jacket Venom [Bee Venom] Rash    urticaria    PHYSICAL EXAM:  Performance status (ECOG): 0 - Asymptomatic  There were no vitals filed for this visit. Wt Readings from Last 3 Encounters:  03/18/22 184 lb (83.5 kg)  03/12/22 177 lb 9.6 oz (80.6 kg)  02/21/22 187 lb 3.2 oz (84.9 kg)   Physical Exam Vitals reviewed.  Constitutional:      Appearance: Normal appearance.  Cardiovascular:     Rate and Rhythm: Normal rate and regular rhythm.      Pulses: Normal pulses.     Heart sounds: Normal heart sounds.  Pulmonary:     Effort: Pulmonary effort is normal.     Breath sounds: Normal breath sounds.  Musculoskeletal:     Right lower leg: No edema.     Left lower leg: No edema.  Neurological:     General: No focal deficit present.     Mental Status: She is alert and oriented to person, place, and time.  Psychiatric:        Mood and Affect: Mood normal.        Behavior: Behavior normal.      LABORATORY DATA:  I have reviewed the labs as listed.     Latest Ref Rng & Units 03/18/2022   10:11 AM 02/21/2022    8:51 AM 01/24/2022    8:40 AM  CBC  WBC 4.0 - 10.5 K/uL 6.0  5.9  5.6   Hemoglobin 12.0 - 15.0 g/dL 8.9  8.8  9.1   Hematocrit 36.0 - 46.0 % 28.4  28.2  28.8   Platelets 150 - 400 K/uL 454  417  417       Latest Ref Rng & Units 03/18/2022   10:11 AM 02/21/2022    8:51 AM 01/24/2022    8:40 AM  CMP  Glucose 70 - 99 mg/dL 165  112  113   BUN 8 - 23 mg/dL 10  15  20    Creatinine 0.44 - 1.00 mg/dL 0.97  1.03  1.08   Sodium 135 - 145 mmol/L 132  137  137   Potassium 3.5 - 5.1 mmol/L 3.8  4.1  4.0   Chloride 98 - 111 mmol/L 101  104  104   CO2 22 - 32 mmol/L 23  26  26    Calcium 8.9 - 10.3 mg/dL 8.7  9.1  9.3   Total Protein 6.5 - 8.1 g/dL 7.7  7.9  8.0   Total Bilirubin 0.3 - 1.2 mg/dL 0.4  0.2  0.3   Alkaline Phos 38 - 126 U/L 270  118  109   AST 15 - 41 U/L 40  20  19   ALT 0 - 44 U/L 40  14  12     DIAGNOSTIC IMAGING:  I have independently reviewed the scans and discussed with the patient. No results found.   ASSESSMENT:  Metastatic clear-cell renal cell carcinoma: - Right radical nephrectomy on 02/06/2021. - Pathology shows clear-cell RCC, grade 4, 12.5 cm, necrosis and rhabdoid features are identified.  Tumor extends into the and invades the wall of the vena cava (pT3c).  Vascular, ureteral and all margins of resection are negative for tumor. - CT scan abdomen with and without contrast on 05/29/2021 showed  several nodules along the peritoneal surface of the right nephrectomy bed warranting close attention. - CTAP with and without contrast on 09/18/2021 showed progressive nodularity within the right nephrectomy bed, with enlarged nodules adjacent to the right hepatic lobe extending inferiorly along the right retroperitoneum with multiple enlarged enhancing nodules.  Direct metastatic invasion into the right hepatic lobe.  Small nodule at the right lung base favored to be benign. - IMDC: Intermediate risk group with 2 features including diagnosis less than 1 year and hemoglobin lower than normal. - Bone scan on 10/05/2021 with focal activity in the mental vertex of the mandible which could be inflammatory/traumatic/metastatic. - CT chest on 10/03/2021 showed several lung nodules scattered bilaterally some of which could be metastatic and many others could be due to mucoid impaction. - NGS testing did not show any targetable mutations.  TMB was low.  CCND3 amplified.  PBRM1 VUS present. - Right nephrectomy bed biopsy (10/22/2021): Clear-cell renal cell carcinoma with necrosis - 4 cycles of ipilimumab and nivolumab from 11/19/2021 through 01/24/2022 - CT CAP on 02/14/2022: Numerous new and enlarged lung nodules.  Significant interval increase in the mass in the right renal bed.  New hypodense liver lesions. - Lenvatinib (12 mg) and everolimus 5 mg daily started on 03/13/2022    Social/family history: - She lives at home with her husband and also takes care of her mother. - She works as a Buyer, retail at [employer].  She quit smoking in 2004 and smoked half pack per day for 10 years. - Half sister (same mother) had kidney cancer at age 70.  Maternal grandfather had kidney cancer.  Sister died of lung cancer.  2 half sisters (same father) had breast cancers.  3.  Left breast stage I tubular adenocarcinoma: - She underwent left mastectomy on 06/11/2002.  0/6 lymph nodes positive.  ER 90%, PR 92%, Ki-67  6%, HER2 negative.  As per chart, declined antiestrogen therapy.   PLAN:  Metastatic clear-cell RCC, intermediate risk by IMDC: - She started taking lenvatinib 12 mg daily on 03/13/2022 and added everolimus 5 mg daily on 03/16/2022. - On 03/15/2022 she developed feeling nausea and vomiting for the next 3 days.  She also experienced a lot of fatigue.  Gradually nausea vomiting and fatigue have improved.  She is eating well.  She gained 7 pounds. - Reviewed labs today which showed alk phos elevated at 270 and albumin down at 2.8.  Glucose is 165.  Sodium is 132. - Blood pressure today is 133/67.  Pain in the right loin region is stable. - I have again discussed common side effects including fatigue, diarrhea, hypotension, liver function abnormalities and rare chance of palmoplantar throat dysesthesia.  RTC 2 weeks for follow-up with repeat labs.  2.  Normocytic anemia: - Hemoglobin is 8.9.  Continue iron tablet daily.  3.  Hypothyroidism: - TSH today is 10.365.  Based  on TSH of 6.6 on 02/21/2022, Dr. Dorris Fetch has increased Synthroid to 100 mcg on 03/12/2022.  Continue 100 mcg daily.   Orders placed this encounter:  No orders of the defined types were placed in this encounter.    Derek Jack, MD [place] [phone]   I, Thana Ates, am acting as a scribe for Dr. Derek Jack.  I, Derek Jack MD, have reviewed the above documentation for accuracy and completeness, and I agree with the above.

## 2022-03-18 NOTE — Progress Notes (Signed)
Patient is taking Afinitor and Lenvima as prescribed.  She has not missed any doses and reports no side effects at this time.

## 2022-03-18 NOTE — Patient Instructions (Addendum)
[place] at [facility] Discharge Instructions   You were seen and examined today by Dr. Delton Coombes.  He reviewed your lab work which is normal/stable.   Continue Afinitor and Lenvima as prescribed.   We will see you back in 2 weeks. We will repeat your lab work at that time.    Thank you for choosing [place] at [facility] to provide your oncology and hematology care.  To afford each patient quality time with our provider, please arrive at least 15 minutes before your scheduled appointment time.   If you have a lab appointment with the [facility] please come in thru the Main Entrance and check in at the main information desk.  You need to re-schedule your appointment should you arrive 10 or more minutes late.  We strive to give you quality time with our providers, and arriving late affects you and other patients whose appointments are after yours.  Also, if you no show three or more times for appointments you may be dismissed from the clinic at the providers discretion.     Again, thank you for choosing [facility].  Our hope is that these requests will decrease the amount of time that you wait before being seen by our physicians.       _____________________________________________________________  Should you have questions after your visit to [facility], please contact our office at [phone] and follow the prompts.  Our office hours are 8:00 a.m. and 4:30 p.m. Monday - Friday.  Please note that voicemails left after 4:00 p.m. may not be returned until the following business day.  We are closed weekends and major holidays.  You do have access to a nurse 24-7, just call the main number to the clinic [phone] and do not press any options, hold on the line and a nurse will answer the phone.    For prescription refill requests, have your pharmacy contact our office and allow 72 hours.    Due to Covid, you  will need to wear a mask upon entering the hospital. If you do not have a mask, a mask will be given to you at the Main Entrance upon arrival. For doctor visits, patients may have 1 support person age 63 or older with them. For treatment visits, patients can not have anyone with them due to social distancing guidelines and our immunocompromised population.

## 2022-04-02 ENCOUNTER — Inpatient Hospital Stay (HOSPITAL_COMMUNITY): Payer: No Typology Code available for payment source

## 2022-04-02 ENCOUNTER — Inpatient Hospital Stay (HOSPITAL_BASED_OUTPATIENT_CLINIC_OR_DEPARTMENT_OTHER): Payer: No Typology Code available for payment source | Admitting: Hematology

## 2022-04-02 DIAGNOSIS — C641 Malignant neoplasm of right kidney, except renal pelvis: Secondary | ICD-10-CM | POA: Diagnosis not present

## 2022-04-02 LAB — COMPREHENSIVE METABOLIC PANEL
ALT: 12 U/L (ref 0–44)
AST: 20 U/L (ref 15–41)
Albumin: 3.2 g/dL — ABNORMAL LOW (ref 3.5–5.0)
Alkaline Phosphatase: 128 U/L — ABNORMAL HIGH (ref 38–126)
Anion gap: 7 (ref 5–15)
BUN: 13 mg/dL (ref 8–23)
CO2: 25 mmol/L (ref 22–32)
Calcium: 8.8 mg/dL — ABNORMAL LOW (ref 8.9–10.3)
Chloride: 103 mmol/L (ref 98–111)
Creatinine, Ser: 0.98 mg/dL (ref 0.44–1.00)
GFR, Estimated: >60 mL/min (ref >60)
Glucose, Bld: 147 mg/dL — ABNORMAL HIGH (ref 70–99)
Potassium: 3.9 mmol/L (ref 3.5–5.1)
Sodium: 135 mmol/L (ref 135–145)
Total Bilirubin: 0.4 mg/dL (ref 0.3–1.2)
Total Protein: 7.6 g/dL (ref 6.5–8.1)

## 2022-04-02 LAB — MAGNESIUM: Magnesium: 2 mg/dL (ref 1.7–2.4)

## 2022-04-02 LAB — CBC WITH DIFFERENTIAL/PLATELET
Abs Immature Granulocytes: 0.01 10*3/uL (ref 0.00–0.07)
Basophils Absolute: 0 10*3/uL (ref 0.0–0.1)
Basophils Relative: 0 %
Eosinophils Absolute: 0.1 10*3/uL (ref 0.0–0.5)
Eosinophils Relative: 2 %
HCT: 29.2 % — ABNORMAL LOW (ref 36.0–46.0)
Hemoglobin: 8.9 g/dL — ABNORMAL LOW (ref 12.0–15.0)
Immature Granulocytes: 0 %
Lymphocytes Relative: 38 %
Lymphs Abs: 1.3 10*3/uL (ref 0.7–4.0)
MCH: 25.2 pg — ABNORMAL LOW (ref 26.0–34.0)
MCHC: 30.5 g/dL (ref 30.0–36.0)
MCV: 82.7 fL (ref 80.0–100.0)
Monocytes Absolute: 0.2 10*3/uL (ref 0.1–1.0)
Monocytes Relative: 6 %
Neutro Abs: 1.8 10*3/uL (ref 1.7–7.7)
Neutrophils Relative %: 54 %
Platelets: 300 10*3/uL (ref 150–400)
RBC: 3.53 MIL/uL — ABNORMAL LOW (ref 3.87–5.11)
RDW: 17.3 % — ABNORMAL HIGH (ref 11.5–15.5)
WBC: 3.5 10*3/uL — ABNORMAL LOW (ref 4.0–10.5)
nRBC: 0 % (ref 0.0–0.2)

## 2022-04-02 MED ORDER — SODIUM CHLORIDE 0.9% FLUSH
10.0000 mL | Freq: Once | INTRAVENOUS | Status: AC
  Administered 2022-04-02: 10 mL via INTRAVENOUS

## 2022-04-02 MED ORDER — HEPARIN SOD (PORK) LOCK FLUSH 100 UNIT/ML IV SOLN
500.0000 [IU] | Freq: Once | INTRAVENOUS | Status: AC
  Administered 2022-04-02: 500 [IU] via INTRAVENOUS

## 2022-04-17 ENCOUNTER — Ambulatory Visit: Payer: No Typology Code available for payment source | Admitting: Family Medicine

## 2022-04-17 ENCOUNTER — Encounter: Payer: Self-pay | Admitting: Family Medicine

## 2022-04-17 VITALS — BP 128/70 | HR 90 | Ht 66.0 in | Wt 178.0 lb

## 2022-04-17 DIAGNOSIS — C641 Malignant neoplasm of right kidney, except renal pelvis: Secondary | ICD-10-CM | POA: Diagnosis not present

## 2022-04-17 DIAGNOSIS — E038 Other specified hypothyroidism: Secondary | ICD-10-CM | POA: Diagnosis not present

## 2022-04-17 DIAGNOSIS — E782 Mixed hyperlipidemia: Secondary | ICD-10-CM | POA: Diagnosis not present

## 2022-04-17 DIAGNOSIS — I10 Essential (primary) hypertension: Secondary | ICD-10-CM | POA: Diagnosis not present

## 2022-04-17 NOTE — Patient Instructions (Signed)
F/U end October or early November, call if you need me sooner  Blood pressure excellent  Use topical antibiotic sparingly to right outer ear x 4 days, and try not to scratrch the ear  Fasting lipid, cmp and eGFR same day you get lab for Dr Dorris Fetch for his October visit  It is important that you exercise regularly at least 30 minutes 5 times a week. If you develop chest pain, have severe difficulty breathing, or feel very tired, stop exercising immediately and seek medical attention   Think about what you will eat, plan ahead. Choose " clean, green, fresh or frozen" over canned, processed or packaged foods which are more sugary, salty and fatty. 70 to 75% of food eaten should be vegetables and fruit. Three meals at set times with snacks allowed between meals, but they must be fruit or vegetables. Aim to eat over a 12 hour period , example 7 am to 7 pm, and STOP after  your last meal of the day. Drink water,generally about 64 ounces per day, no other drink is as healthy. Fruit juice is best enjoyed in a healthy way, by EATING the fruit. Thanks for choosing [facility], we consider it a privelige to serve you.

## 2022-04-17 NOTE — Assessment & Plan Note (Signed)
Hyperlipidemia:Low fat diet discussed and encouraged.   Lipid Panel  Lab Results  Component Value Date   CHOL 175 07/10/2021   HDL 60 07/10/2021   LDLCALC 101 (H) 07/10/2021   TRIG 76 07/10/2021   CHOLHDL 2.9 07/10/2021     Updated lab needed at/ before next visit.

## 2022-04-17 NOTE — Assessment & Plan Note (Signed)
Managed by endo

## 2022-04-17 NOTE — Progress Notes (Signed)
.        Deanna Bush     MRN: [id number]      DOB: Aug 25, 1961   HPI Deanna Bush is here for follow up and re-evaluation of chronic medical conditions, medication management and review of any available recent lab and radiology data.  Preventive health is updated, specifically  Cancer screening and Immunization.   Questions or concerns regarding consultations or procedures which the PT has had in the interim are  addressed. The PT denies any adverse reactions to current medications since the last visit.  Right ear discomfort x 5 days, scratched the ear , using neosporin with good success Appetite and energy level are good  ROS Denies recent fever or chills. Denies sinus pressure, nasal congestion,  or sore throat. Denies chest congestion, productive cough or wheezing. Denies chest pains, palpitations and leg swelling Denies abdominal pain, nausea, vomiting,diarrhea or constipation.   Denies dysuria, frequency, hesitancy or incontinence. Denies joint pain, swelling and limitation in mobility. Denies headaches, seizures, numbness, or tingling. Denies depression, anxiety or insomnia. Peeling of skin on hands.   PE  BP 128/70   Pulse 90   Ht '5\' 6"'$  (1.676 m)   Wt 178 lb 0.6 oz (80.8 kg)   SpO2 96%   BMI 28.74 kg/m   Patient alert and oriented and in no cardiopulmonary distress.  HEENT: No facial asymmetry, EOMI,     Neck supple .Right external ear scalong present  Chest: Clear to auscultation bilaterally.  CVS: S1, S2 no murmurs, no S3.Regular rate.  ABD: Soft non tender.   Ext: No edema  MS: Adequate ROM spine, shoulders, hips and knees.  Skin:Peeling of skin on palms Psych: Good eye contact, normal affect. Memory intact not anxious or depressed appearing.  CNS: CN 2-12 intact, power,  normal throughout.no focal deficits noted.   Assessment & Plan  Primary hypertension Controlled, no change in medication DASH diet and commitment to daily physical activity for a  minimum of 30 minutes discussed and encouraged, as a part of hypertension management. The importance of attaining a healthy weight is also discussed.     04/17/2022    8:09 AM 04/02/2022    9:18 AM 03/18/2022   10:13 AM 03/12/2022    8:17 AM 02/21/2022    9:27 AM 01/24/2022   12:50 PM 01/24/2022    8:45 AM  BP/Weight  Systolic BP 071 219 758 832 549 826 415  Diastolic BP 70 68 67 58 58 55 56  Wt. (Lbs) 178.04 180.4 184 177.6 187.2  189  BMI 28.74 kg/m2 29.12 kg/m2 29.57 kg/m2 28.67 kg/m2 29.56 kg/m2  30.34 kg/m2       Mixed hyperlipidemia Hyperlipidemia:Low fat diet discussed and encouraged.   Lipid Panel  Lab Results  Component Value Date   CHOL 175 07/10/2021   HDL 60 07/10/2021   LDLCALC 101 (H) 07/10/2021   TRIG 76 07/10/2021   CHOLHDL 2.9 07/10/2021     Updated lab needed at/ before next visit.   Hypothyroidism Managed by endo  Renal cell cancer ([place]) In treatment managed by Oncology

## 2022-04-17 NOTE — Assessment & Plan Note (Signed)
In treatment managed by Oncology

## 2022-04-17 NOTE — Assessment & Plan Note (Signed)
Controlled, no change in medication DASH diet and commitment to daily physical activity for a minimum of 30 minutes discussed and encouraged, as a part of hypertension management. The importance of attaining a healthy weight is also discussed.     04/17/2022    8:09 AM 04/02/2022    9:18 AM 03/18/2022   10:13 AM 03/12/2022    8:17 AM 02/21/2022    9:27 AM 01/24/2022   12:50 PM 01/24/2022    8:45 AM  BP/Weight  Systolic BP 155 208 022 336 122 449 753  Diastolic BP 70 68 67 58 58 55 56  Wt. (Lbs) 178.04 180.4 184 177.6 187.2  189  BMI 28.74 kg/m2 29.12 kg/m2 29.57 kg/m2 28.67 kg/m2 29.56 kg/m2  30.34 kg/m2

## 2022-04-18 ENCOUNTER — Other Ambulatory Visit (HOSPITAL_COMMUNITY): Payer: Self-pay | Admitting: *Deleted

## 2022-04-18 MED ORDER — EVEROLIMUS 5 MG PO TABS: 5.0000 mg | ORAL_TABLET | Freq: Every day | ORAL | 0 refills | Status: DC

## 2022-04-18 NOTE — Telephone Encounter (Signed)
Refill approval for Afinitor 5 mg tablets with 0 refills sent to [facility] [employer].  Per last ovn, patient is tolerating and to continue therapy.

## 2022-04-29 ENCOUNTER — Other Ambulatory Visit: Payer: Self-pay

## 2022-04-29 ENCOUNTER — Ambulatory Visit (HOSPITAL_COMMUNITY)
Admission: RE | Admit: 2022-04-29 | Discharge: 2022-04-29 | Disposition: A | Discharge status: Home / Self Care | Payer: No Typology Code available for payment source | Source: Ambulatory Visit | Attending: Family Medicine | Admitting: Family Medicine

## 2022-04-29 DIAGNOSIS — Z1231 Encounter for screening mammogram for malignant neoplasm of breast: Secondary | ICD-10-CM | POA: Insufficient documentation

## 2022-04-30 ENCOUNTER — Other Ambulatory Visit (HOSPITAL_COMMUNITY): Payer: Self-pay | Admitting: *Deleted

## 2022-04-30 MED ORDER — EVEROLIMUS 5 MG PO TABS: 5.0000 mg | ORAL_TABLET | Freq: Every day | ORAL | 3 refills | Status: DC

## 2022-05-01 ENCOUNTER — Encounter: Payer: Self-pay | Admitting: Urology

## 2022-05-01 ENCOUNTER — Ambulatory Visit: Payer: No Typology Code available for payment source | Admitting: Urology

## 2022-05-01 VITALS — BP 157/67 | HR 69

## 2022-05-01 DIAGNOSIS — N2889 Other specified disorders of kidney and ureter: Secondary | ICD-10-CM

## 2022-05-01 LAB — URINALYSIS, ROUTINE W REFLEX MICROSCOPIC
Bilirubin, UA: NEGATIVE
Glucose, UA: NEGATIVE
Ketones, UA: NEGATIVE
Leukocytes,UA: NEGATIVE
Nitrite, UA: NEGATIVE
Protein,UA: NEGATIVE
Specific Gravity, UA: 1.01 (ref 1.005–1.030)
Urobilinogen, Ur: 0.2 mg/dL (ref 0.2–1.0)
pH, UA: 5.5 (ref 5.0–7.5)

## 2022-05-01 LAB — MICROSCOPIC EXAMINATION: Renal Epithel, UA: NONE SEEN /hpf

## 2022-05-01 IMAGING — MR MR KNEE*L* W/O CM
6 series · 38 of 40 positions shown · non-contrast
Comparison: None.

CLINICAL DATA: Chronic pain and swelling.

EXAM:
MRI OF THE LEFT KNEE WITHOUT CONTRAST
TECHNIQUE: Multiplanar, multisequence MR imaging of the knee was performed. No
intravenous contrast was administered.

[Series 5: T2 fat-sat · axial · left · 4.0mm · 0.50mm/px · z∈[-73,+62]mm · 9 of 32 slices shown (1 of 3)]
[im 1/32]
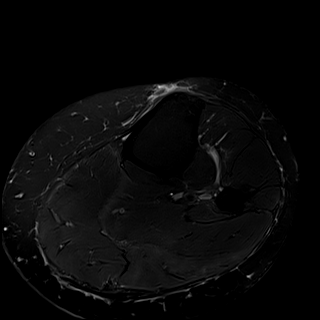
[im 4/32]
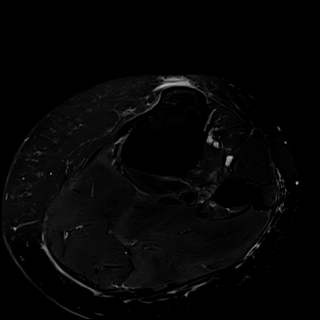
[im 8/32]
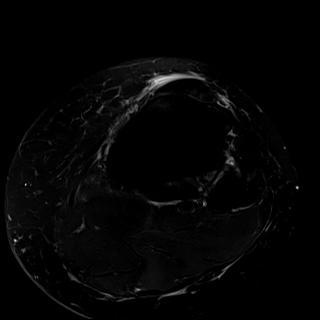
[im 12/32]
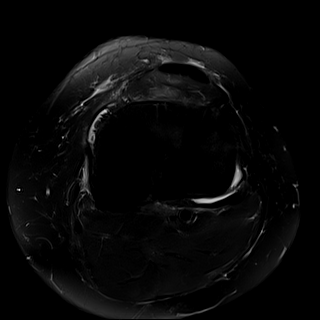
[im 16/32]
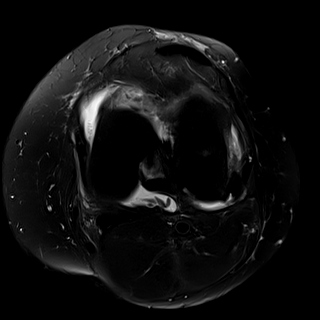
[im 20/32]
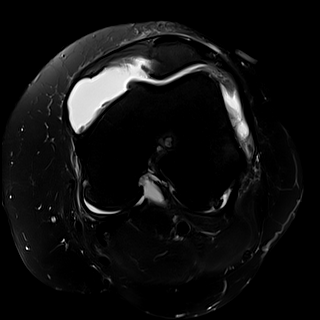
[im 24/32]
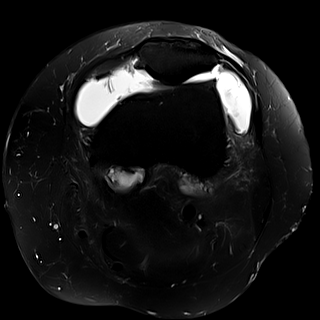
[im 28/32]
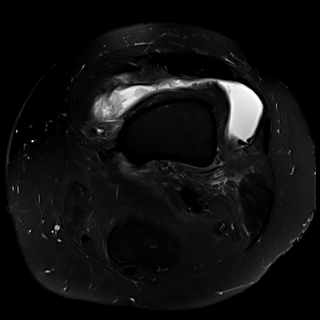
[im 32/32]
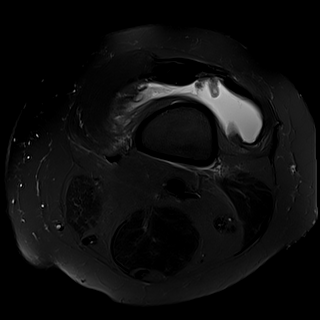

[Series 6: T1 · coronal · left · 4.0mm · 0.29mm/px · 5 of 25 slices shown]
[im 1/25]
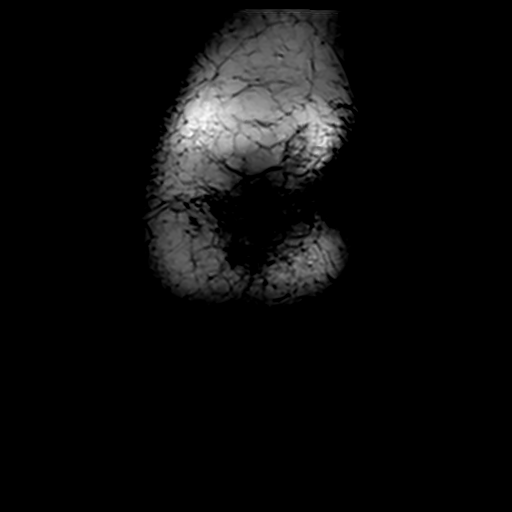
[im 5/25]
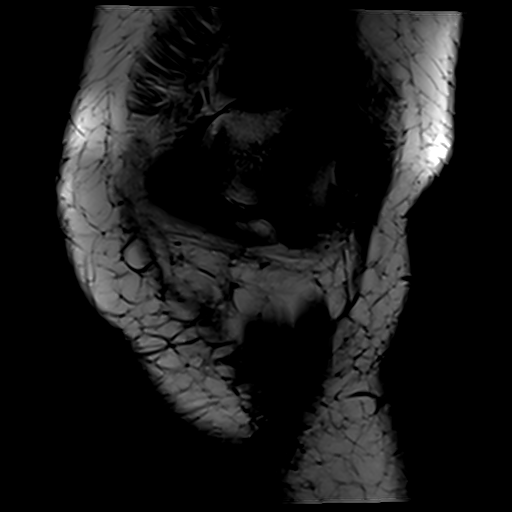
[im 9/25]
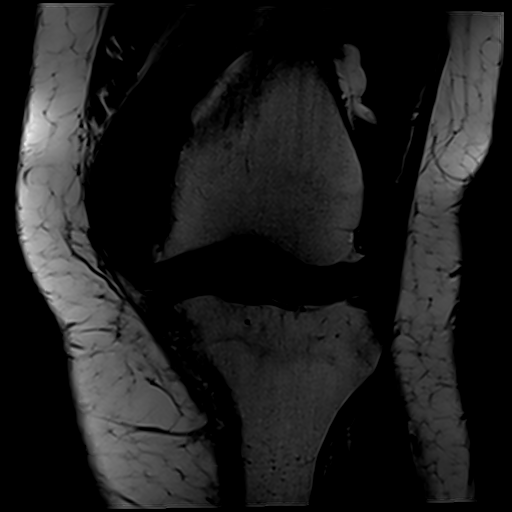
[im 13/25]
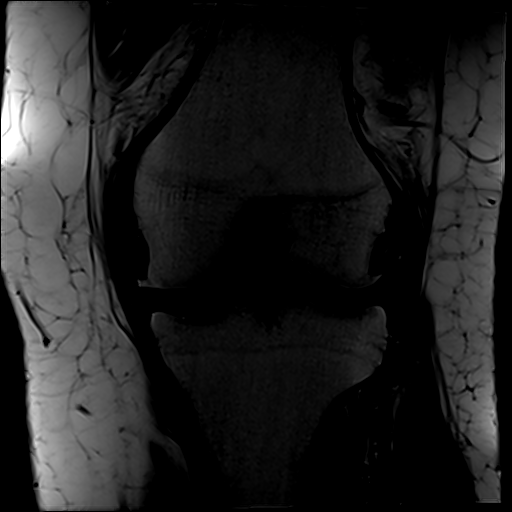
[im 17/25]
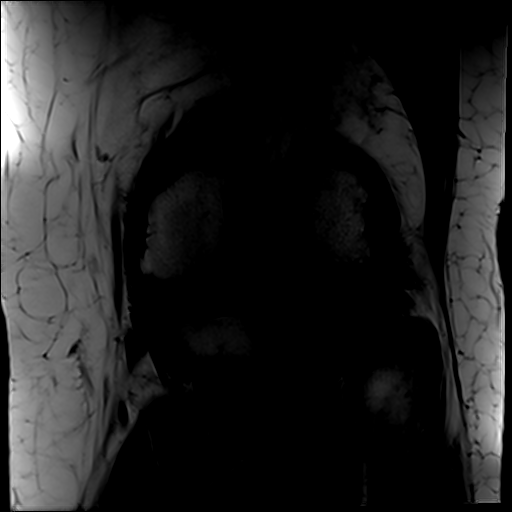

[Series 7: T2 fat-sat · coronal · left · 4.0mm · 0.59mm/px · 6 of 25 slices shown (2 of 3)]
[im 1/25]
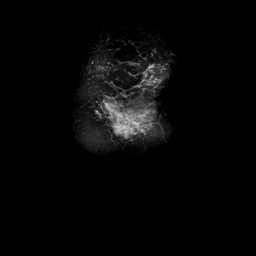
[im 5/25]
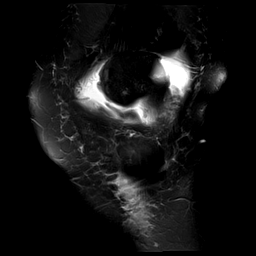
[im 10/25]
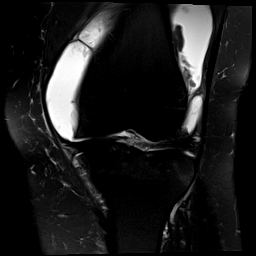
[im 15/25]
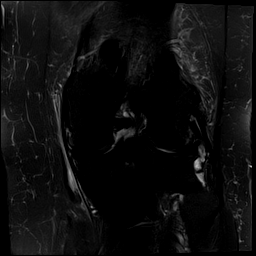
[im 20/25]
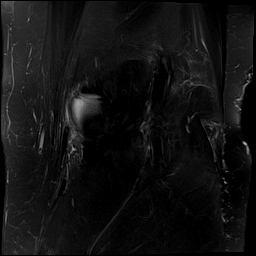
[im 25/25]
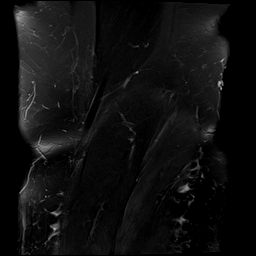

[Series 8: PD fat-sat · coronal · left · 4.0mm · 0.47mm/px · 6 of 25 slices shown (1 of 2)]
[im 1/25]
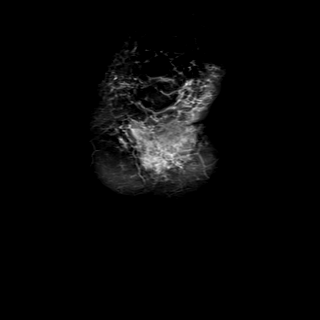
[im 5/25]
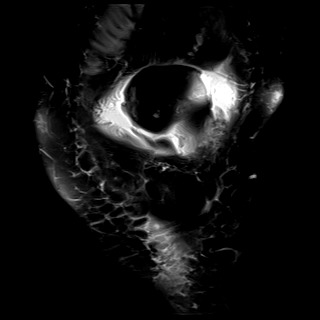
[im 10/25]
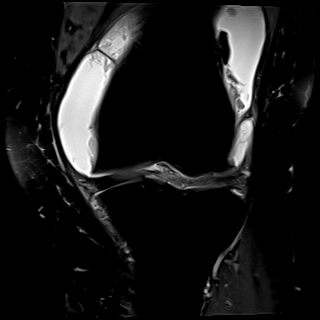
[im 15/25]
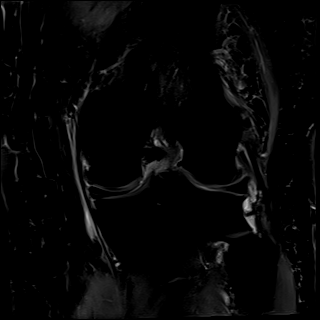
[im 20/25]
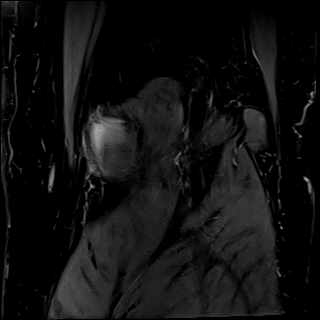
[im 25/25]
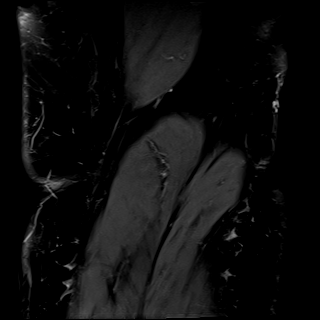

[Series 9: PD fat-sat · sagittal · left · 4.0mm · 0.47mm/px · 6 of 25 slices shown (2 of 2)]
[im 1/25]
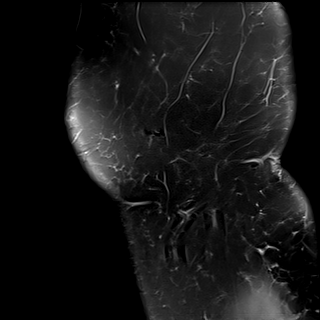
[im 5/25]
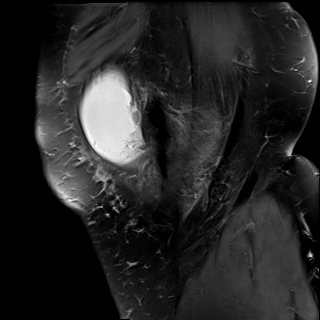
[im 10/25]
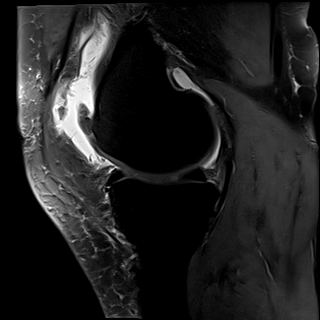
[im 15/25]
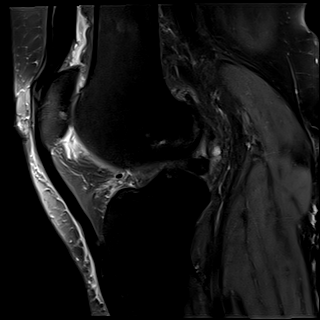
[im 20/25]
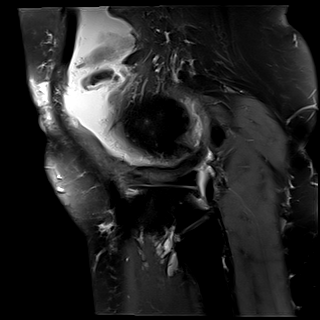
[im 25/25]
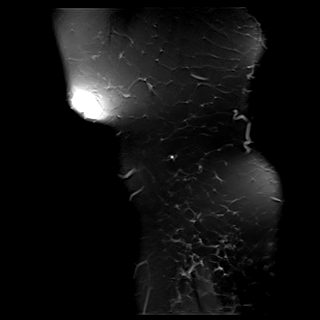

[Series 10: T2 fat-sat · sagittal · left · 4.0mm · 0.47mm/px · 6 of 25 slices shown (3 of 3)]
[im 1/25]
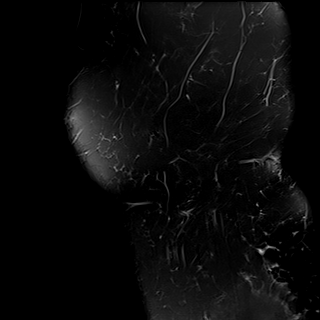
[im 5/25]
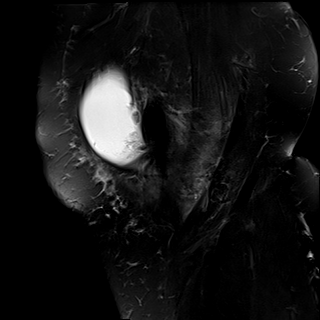
[im 10/25]
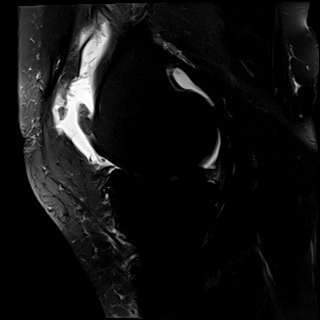
[im 15/25]
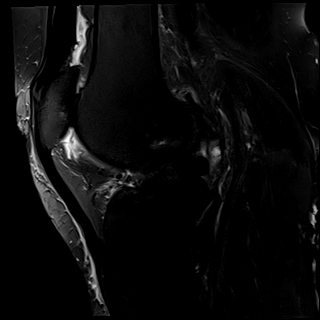
[im 20/25]
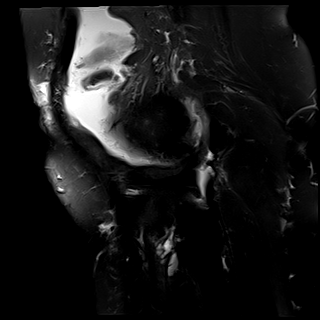
[im 25/25]
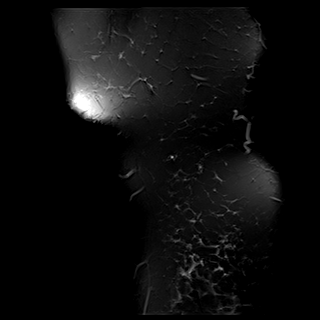

[38 of 40 positions shown; findings below may reference images not displayed]

FINDINGS: MENISCI

Medial meniscus: Moderate intrasubstance degenerative type changes
but no discrete meniscal tear.

Lateral meniscus: Degenerated and torn in the midbody region and
anterior horn.

LIGAMENTS

Cruciates:  Intact.

Collaterals:  Intact.  Mild MCL and pes anserine bursitis.

CARTILAGE

Patellofemoral: Moderate degenerative chondrosis with moderate
cartilage thinning, fraying and fibrillation mainly along the
patellar apex. There is also early joint space narrowing and
spurring. Subchondral cystic changes noted.

Medial:  Mild degenerative chondrosis with early spurring changes.

Lateral: Mild to moderate degenerative chondrosis with spurring
changes.

Joint: Moderate to large joint effusion. Moderate synovitis and
small loose bodies are noted.

Popliteal Fossa: No popliteal mass or Baker's cyst. Some fluid
tracking back along the popliteus tendon.

Extensor Mechanism: The patella retinacular structures are intact
and the quadriceps and patellar tendons are intact.

Bones:  No acute bony findings.

Other: Unremarkable knee musculature.
IMPRESSION: 1. Degenerated and torn lateral meniscus.
2. Intact ligamentous structures and no acute bony findings.
3. Moderate tricompartmental degenerative changes.
4. Moderate to large joint effusion, moderate synovitis and small
loose bodies.

## 2022-05-01 NOTE — Progress Notes (Signed)
05/01/2022 4:08 PM   Shelle Bush [birth year] [id number]  Referring provider: Fayrene Helper, MD [address], [place],  [place] [address]  Followup metastatic RCC   HPI: Deanna Bush is a 61yo her for followup for metastatic RCC. She is on  lenvatinib 12 mg daily and everolimus 5 mg daily. She notes her right flank pain has significantly improved since starting the current therapy. NO significant LUTS. No hematuria.    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    Cellulitis of leg, left    Complication of anesthesia    Hard to wake up   Diabetes mellitus without complication ([place])    Pt denies   Family history of breast cancer 08/16/2011   Family history of breast cancer    Family history of colon cancer    Family history of kidney cancer    Family history of prostate cancer    Hyperlipidemia    Hypertension    Hypothyroidism    MRSA (methicillin resistant staph aureus) culture positive 08/19/2011   Pre-diabetes     Surgical History: Past Surgical History:  Procedure Laterality Date   BREAST SURGERY Left    mastectomy   CESAREAN SECTION     x2   COLONOSCOPY N/A 03/03/2020   Procedure: COLONOSCOPY;  Surgeon: Daneil Dolin, MD;  [facility];  Service: Endoscopy;  Laterality: N/A;  12:00   IR IMAGING GUIDED PORT INSERTION  11/07/2021   left mastectomy     NEPHRECTOMY Right 02/06/2021   Procedure: NEPHRECTOMY- open radical;  Surgeon: Cleon Gustin, MD;  Location: [facility];  Service: Urology;  Laterality: Right;   POLYPECTOMY  03/03/2020   Procedure: POLYPECTOMY;  Surgeon: Daneil Dolin, MD;  [facility]: [facility];  Service: Endoscopy;;    Home Medications:  Allergies as of 05/01/2022       Reactions   Sulfonamide Derivatives Itching   Yellow Jacket Venom [bee Venom] Rash   urticaria        Medication List        Accurate as of May 01, 2022  4:08 PM. If you have any questions, ask  your nurse or doctor.          acetaminophen 500 MG tablet Commonly known as: TYLENOL Take 500 mg by mouth every 6 (six) hours as needed for moderate pain or headache.   amLODipine 5 MG tablet Commonly known as: NORVASC TAKE 1 TABLET (5 MG TOTAL) BY MOUTH DAILY.   atorvastatin 10 MG tablet Commonly known as: LIPITOR TAKE ONE TABLET BY MOUTH ONCE DAILY   EPINEPHrine 0.3 mg/0.3 mL Soaj injection Commonly known as: EPI-PEN Inject 0.3 mg into the muscle as needed for anaphylaxis.   everolimus 5 MG tablet Commonly known as: AFINITOR Take 1 tablet (5 mg total) by mouth daily.   Bush (Ferrous Sulfate) 325 (65 Fe) MG Tabs Take 325 mg by mouth daily.   Lenvima (12 MG Daily Dose) 3 x 4 MG capsule Generic drug: lenvatinib 12 mg daily dose Take 12 mg by mouth daily.   levothyroxine 100 MCG tablet Commonly known as: SYNTHROID Take 1 tablet (100 mcg total) by mouth daily before breakfast.   lidocaine-prilocaine cream Commonly known as: EMLA Apply to affected area once   multivitamin with minerals Tabs tablet Take 1 tablet by mouth daily.   prochlorperazine 10 MG tablet Commonly known as: COMPAZINE Take 1 tablet (10  mg total) by mouth every 6 (six) hours as needed for nausea or vomiting.   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        Allergies:  Allergies  Allergen Reactions   Sulfonamide Derivatives Itching   Yellow Jacket Venom [Bee Venom] Rash    urticaria    Family History: Family History  Problem Relation Age of Onset   Hypertension Mother    Diabetes Mother    Hyperlipidemia Mother    Colon cancer Maternal Aunt        dx 54s   Prostate cancer Maternal Uncle    Throat cancer Maternal Uncle    Kidney cancer Maternal Grandfather    Lung cancer Half-Sister    Kidney cancer Half-Sister 61   Breast cancer Half-Sister 15   Breast cancer Half-Sister 26    Social History:  reports that  she quit smoking about 19 years ago. Her smoking use included cigarettes. She has a 9.00 pack-year smoking history. She has never used smokeless tobacco. She reports that she does not drink alcohol and does not use drugs.  ROS: All other review of systems were reviewed and are negative except what is noted above in HPI  Physical Exam: BP (!) 157/67   Pulse 69   Constitutional:  Alert and oriented, No acute distress. HEENT: [place] AT, moist mucus membranes.  Trachea midline, no masses. Cardiovascular: No clubbing, cyanosis, or edema. Respiratory: Normal respiratory effort, no increased work of breathing. GI: Abdomen is soft, nontender, nondistended, no abdominal masses GU: No CVA tenderness.  Lymph: No cervical or inguinal lymphadenopathy. Skin: No rashes, bruises or suspicious lesions. Neurologic: Grossly intact, no focal deficits, moving all 4 extremities. Psychiatric: Normal mood and affect.  Laboratory Data: Lab Results  Component Value Date   WBC 3.5 (L) 04/02/2022   HGB 8.9 (L) 04/02/2022   HCT 29.2 (L) 04/02/2022   MCV 82.7 04/02/2022   PLT 300 04/02/2022    Lab Results  Component Value Date   CREATININE 0.98 04/02/2022    No results found for: "PSA"  No results found for: "TESTOSTERONE"  Lab Results  Component Value Date   HGBA1C 6.6 03/12/2022    Urinalysis    Component Value Date/Time   COLORURINE RED (A) 12/23/2020 1939   APPEARANCEUR Clear 09/21/2021 1007   LABSPEC 1.002 (L) 08/25/2020 2020   PHURINE 7.0 08/25/2020 2020   GLUCOSEU Negative 09/21/2021 1007   HGBUR (A) 12/23/2020 1939    TEST NOT REPORTED DUE TO COLOR INTERFERENCE OF URINE PIGMENT   BILIRUBINUR Negative 09/21/2021 1007   KETONESUR (A) 12/23/2020 1939    TEST NOT REPORTED DUE TO COLOR INTERFERENCE OF URINE PIGMENT   PROTEINUR Negative 09/21/2021 1007   PROTEINUR (A) 12/23/2020 1939    TEST NOT REPORTED DUE TO COLOR INTERFERENCE OF URINE PIGMENT   UROBILINOGEN 0.2 04/18/2021 0855    NITRITE Negative 09/21/2021 1007   NITRITE (A) 12/23/2020 1939    TEST NOT REPORTED DUE TO COLOR INTERFERENCE OF URINE PIGMENT   LEUKOCYTESUR Negative 09/21/2021 1007   LEUKOCYTESUR (A) 12/23/2020 1939    TEST NOT REPORTED DUE TO COLOR INTERFERENCE OF URINE PIGMENT    Lab Results  Component Value Date   LABMICR Comment 09/21/2021   WBCUA 0-5 02/21/2021   LABEPIT >10 (A) 02/21/2021   BACTERIA Few (A) 02/21/2021    Pertinent Imaging:  No results found for this or any previous visit.  No  results found for this or any previous visit.  No results found for this or any previous visit.  No results found for this or any previous visit.  No results found for this or any previous visit.  No results found for this or any previous visit.  No results found for this or any previous visit.  Results for orders placed during the hospital encounter of 12/23/20  CT RENAL STONE STUDY  Narrative CLINICAL DATA:  61 year old female with flank pain. Concern for kidney stone.  EXAM: CT ABDOMEN AND PELVIS WITHOUT CONTRAST  TECHNIQUE: Multidetector CT imaging of the abdomen and pelvis was performed following the standard protocol without IV contrast.  COMPARISON:  None.  FINDINGS: Evaluation of this exam is limited in the absence of intravenous contrast.  Lower chest: Bibasilar linear atelectasis/scarring. There is a 3 mm right lower lobe nodule (8/4). The visualized lung bases are otherwise clear. There is hypoattenuation of the cardiac blood pool suggestive of anemia. Clinical correlation is recommended.  No intra-abdominal free air or free fluid.  Hepatobiliary: Apparent infiltration of the right renal mass into the posterior right lobe of the liver (segment VII). The liver is otherwise unremarkable. No intrahepatic biliary dilatation. No calcified gallstone or pericholecystic fluid.  Pancreas: Unremarkable. No pancreatic ductal dilatation or surrounding inflammatory  changes.  Spleen: Normal in size without focal abnormality.  Adrenals/Urinary Tract: The adrenal glands unremarkable. There is a solid predominantly hypoattenuating right renal upper pole mass measuring approximately 6 x 9 cm in greatest axial dimensions and 11 cm in craniocaudal length most consistent with malignancy. Further characterization with renal mass protocol MRI on a nonemergent/outpatient basis recommended. There is superior extension of the mass with apparent invasion into the right lobe of the liver. There is probable extension of the mass into the right renal pelvis with a degree of obstruction and mild right hydronephrosis. There is no hydronephrosis or nephrolithiasis on the left. The visualized ureters and urinary bladder appear unremarkable.  Stomach/Bowel: There is moderate stool throughout the colon. No bowel obstruction or active inflammation. The appendix is normal.  Vascular/Lymphatic: Mild aortoiliac atherosclerotic disease. The IVC is unremarkable. Evaluation for possible tumor thrombus is limited on this noncontrast CT. CT with IV contrast may provide better evaluation. No portal venous gas. No adenopathy.  Reproductive: The uterus is anteverted and grossly unremarkable. No adnexal masses.  Other: Left mastectomy with partially visualized prosthesis.  Musculoskeletal: Degenerative changes of the spine. No acute osseous pathology.  IMPRESSION: 1. Large right renal upper pole mass most consistent with malignancy. CT with IV contrast may provide better evaluation of potential tumor thrombus. 2. Mild right hydronephrosis secondary to extension of the mass into the right renal pelvis. 3. No bowel obstruction. Normal appendix. 4. A 3 mm right lower lobe nodule. 5. Aortic Atherosclerosis (ICD10-I70.0).   Electronically Signed By: Anner Crete M.D. On: 12/24/2020 00:49   Assessment & Plan:    1. Renal mass -Management per Dr. Delton Coombes. RTC 6  months  - Urinalysis, Routine w reflex microscopic   No follow-ups on file.  Nicolette Bang, MD  [facility] [place]

## 2022-05-01 NOTE — Patient Instructions (Signed)
Kidney Cancer  Kidney cancer is an abnormal growth of cells in one or both kidneys. The kidneys filter waste from your blood and produce urine. Kidney cancer may spread to other parts of your body. This type of cancer may also be called renal cell carcinoma. What are the causes? The cause of this condition is not always known. In some cases, abnormal changes to genes (genetic mutations) can cause cells to form cancer. What increases the risk? You may be more likely to develop kidney cancer if you: Are over age 8. The risk increases with age. Have a family history of kidney cancer. Are of African-American, Native American, or Native [place] descent. Smoke. Are female. Are obese. Have high blood pressure (hypertension). Have advanced kidney disease, especially if you need long-term dialysis. Have certain conditions that are passed from parent to child (inherited), such as von Hippel-Lindau disease, tuberous sclerosis, or hereditary papillary renal carcinoma. Have been exposed to certain chemicals. What are the signs or symptoms? In the early stages, kidney cancer does not cause symptoms. As the cancer grows, symptoms may include: Blood in the urine. Pain in the upper back or abdomen, just below the rib cage. You may feel pain on one or both sides of the body. Fatigue. Unexplained weight loss. Fever. How is this diagnosed? This condition may be diagnosed based on: Your symptoms and medical history. A physical exam. Blood and urine tests. X-rays. Imaging tests, such as CT scans, MRIs, and PET scans. Having dye injected into your blood through an IV, and then having X-rays taken of: Your kidneys and the rest of the organs involved in making and storing urine (intravenous pyelogram). Your blood vessels (angiogram). Removal and testing of a kidney tissue sample (biopsy). Your cancer will be assessed (staged), based on how severe it is and how much it has spread. How is this  treated? Treatment depends on the type and stage of the cancer. Treatment may include one or more of the following: Surgery. This may include surgery to remove: Just the tumor (nephron-sparing surgery). The entire kidney (nephrectomy). The kidney, some of the surrounding healthy tissue, nearby lymph nodes, and the adrenal gland in certain cases (radical nephrectomy). Medicines that kill cancer cells (chemotherapy). High-energy rays that kill cancer cells (radiation therapy). Targeted therapy. This targets specific parts of cancer cells and the area around them to block the growth and the spread of the cancer. Targeted therapy can help to limit the damage to healthy cells. Medicines that help your body's disease-fighting system (immune system) fight cancer cells (immunotherapy). Freezing cancer cells using gas or liquid that is delivered through a needle (cryoablation). Destroying cancer cells using high-energy radio waves that are delivered through a needle-like probe (radiofrequency ablation). A procedure to block the artery that supplies blood to the tumor, which kills the cancer cells (embolization). Follow these instructions at home: Eating and drinking Some of your treatments might affect your appetite and your ability to chew and swallow. If you are having problems eating, or if you do not have an appetite, meet with a diet and nutrition specialist (dietitian). If you have side effects that affect eating, it may help to: Eat smaller meals and snacks often. Drink high-nutrition and high-calorie shakes or supplements. Eat bland and soft foods that are easy to eat. Not eat foods that are hot, spicy, or hard to swallow. Lifestyle Do not drink alcohol. Do not use any products that contain nicotine or tobacco, such as cigarettes and e-cigarettes. If you need  help quitting, ask your health care provider. General instructions  Take over-the-counter and prescription medicines only as told by  your health care provider. This includes vitamins, supplements, and herbal products. Consider joining a support group to help you cope with the stress of having kidney cancer. Work with your health care provider to manage any side effects of treatment. Keep all follow-up visits as told by your health care provider. This is important. Where to find more information American Cancer Society: https://www.cancer.[place] ([place]): https://www.cancer.gov Contact a health care provider if you: Notice that you bruise or bleed easily. Are losing weight without trying. Have new or increased fatigue or weakness. Get help right away if you have: Blood in your urine. A sudden increase in pain. A fever. Shortness of breath. Chest pain. Yellow skin or whites of your eyes (jaundice). Summary Kidney cancer is an abnormal growth of cells (tumor) in one or both kidneys. Tumors may spread to other parts of your body. In the early stages, kidney cancer does not cause symptoms. As the cancer grows, symptoms may include blood in the urine, pain in the upper back or abdomen, unexplained weight loss, fatigue, and fever. Treatment depends on the type and stage of the cancer. It may include surgery to remove the tumor, procedures and medicines to kill the cancer cells, or medicines to help your body fight cancer cells. This information is not intended to replace advice given to you by your health care provider. Make sure you discuss any questions you have with your health care provider. Document Revised: 05/15/2021 Document Reviewed: 05/15/2021 Elsevier Patient Education  [place].

## 2022-05-02 ENCOUNTER — Inpatient Hospital Stay (HOSPITAL_COMMUNITY): Payer: No Typology Code available for payment source | Attending: Hematology | Admitting: Hematology

## 2022-05-02 ENCOUNTER — Inpatient Hospital Stay (HOSPITAL_COMMUNITY): Payer: No Typology Code available for payment source

## 2022-05-02 ENCOUNTER — Other Ambulatory Visit: Payer: Self-pay

## 2022-05-02 DIAGNOSIS — Z79890 Hormone replacement therapy (postmenopausal): Secondary | ICD-10-CM | POA: Diagnosis not present

## 2022-05-02 DIAGNOSIS — Z87891 Personal history of nicotine dependence: Secondary | ICD-10-CM | POA: Diagnosis not present

## 2022-05-02 DIAGNOSIS — Z853 Personal history of malignant neoplasm of breast: Secondary | ICD-10-CM | POA: Insufficient documentation

## 2022-05-02 DIAGNOSIS — Z8042 Family history of malignant neoplasm of prostate: Secondary | ICD-10-CM | POA: Diagnosis not present

## 2022-05-02 DIAGNOSIS — Z9012 Acquired absence of left breast and nipple: Secondary | ICD-10-CM | POA: Diagnosis not present

## 2022-05-02 DIAGNOSIS — Z8051 Family history of malignant neoplasm of kidney: Secondary | ICD-10-CM | POA: Insufficient documentation

## 2022-05-02 DIAGNOSIS — Z808 Family history of malignant neoplasm of other organs or systems: Secondary | ICD-10-CM | POA: Diagnosis not present

## 2022-05-02 DIAGNOSIS — I10 Essential (primary) hypertension: Secondary | ICD-10-CM | POA: Insufficient documentation

## 2022-05-02 DIAGNOSIS — Z800 Family history of malignant neoplasm of digestive organs: Secondary | ICD-10-CM | POA: Diagnosis not present

## 2022-05-02 DIAGNOSIS — Z803 Family history of malignant neoplasm of breast: Secondary | ICD-10-CM | POA: Insufficient documentation

## 2022-05-02 DIAGNOSIS — D649 Anemia, unspecified: Secondary | ICD-10-CM | POA: Insufficient documentation

## 2022-05-02 DIAGNOSIS — C641 Malignant neoplasm of right kidney, except renal pelvis: Secondary | ICD-10-CM | POA: Diagnosis present

## 2022-05-02 DIAGNOSIS — E119 Type 2 diabetes mellitus without complications: Secondary | ICD-10-CM | POA: Insufficient documentation

## 2022-05-02 DIAGNOSIS — E039 Hypothyroidism, unspecified: Secondary | ICD-10-CM | POA: Diagnosis not present

## 2022-05-02 DIAGNOSIS — Z905 Acquired absence of kidney: Secondary | ICD-10-CM | POA: Diagnosis not present

## 2022-05-02 DIAGNOSIS — Z801 Family history of malignant neoplasm of trachea, bronchus and lung: Secondary | ICD-10-CM | POA: Insufficient documentation

## 2022-05-02 LAB — COMPREHENSIVE METABOLIC PANEL
ALT: 13 U/L (ref 0–44)
AST: 17 U/L (ref 15–41)
Albumin: 3.5 g/dL (ref 3.5–5.0)
Alkaline Phosphatase: 92 U/L (ref 38–126)
Anion gap: 4 — ABNORMAL LOW (ref 5–15)
BUN: 15 mg/dL (ref 8–23)
CO2: 25 mmol/L (ref 22–32)
Calcium: 8.9 mg/dL (ref 8.9–10.3)
Chloride: 108 mmol/L (ref 98–111)
Creatinine, Ser: 1.13 mg/dL — ABNORMAL HIGH (ref 0.44–1.00)
GFR, Estimated: 55 mL/min — ABNORMAL LOW (ref >60)
Glucose, Bld: 135 mg/dL — ABNORMAL HIGH (ref 70–99)
Potassium: 3.4 mmol/L — ABNORMAL LOW (ref 3.5–5.1)
Sodium: 137 mmol/L (ref 135–145)
Total Bilirubin: 0.4 mg/dL (ref 0.3–1.2)
Total Protein: 7.1 g/dL (ref 6.5–8.1)

## 2022-05-02 LAB — CBC WITH DIFFERENTIAL/PLATELET
Abs Immature Granulocytes: 0 10*3/uL (ref 0.00–0.07)
Basophils Absolute: 0 10*3/uL (ref 0.0–0.1)
Basophils Relative: 0 %
Eosinophils Absolute: 0.1 10*3/uL (ref 0.0–0.5)
Eosinophils Relative: 2 %
HCT: 30.3 % — ABNORMAL LOW (ref 36.0–46.0)
Hemoglobin: 9.4 g/dL — ABNORMAL LOW (ref 12.0–15.0)
Immature Granulocytes: 0 %
Lymphocytes Relative: 43 %
Lymphs Abs: 1.7 10*3/uL (ref 0.7–4.0)
MCH: 25.6 pg — ABNORMAL LOW (ref 26.0–34.0)
MCHC: 31 g/dL (ref 30.0–36.0)
MCV: 82.6 fL (ref 80.0–100.0)
Monocytes Absolute: 0.2 10*3/uL (ref 0.1–1.0)
Monocytes Relative: 6 %
Neutro Abs: 2 10*3/uL (ref 1.7–7.7)
Neutrophils Relative %: 49 %
Platelets: 245 10*3/uL (ref 150–400)
RBC: 3.67 MIL/uL — ABNORMAL LOW (ref 3.87–5.11)
RDW: 18.1 % — ABNORMAL HIGH (ref 11.5–15.5)
WBC: 4 10*3/uL (ref 4.0–10.5)
nRBC: 0 % (ref 0.0–0.2)

## 2022-05-02 LAB — MAGNESIUM: Magnesium: 2.1 mg/dL (ref 1.7–2.4)

## 2022-05-02 MED ORDER — SODIUM CHLORIDE 0.9% FLUSH
10.0000 mL | INTRAVENOUS | Status: DC | PRN
  Administered 2022-05-02: 10 mL via INTRAVENOUS

## 2022-05-02 MED ORDER — UREA 10 % EX CREA: TOPICAL_CREAM | Freq: Two times a day (BID) | CUTANEOUS | 0 refills | Status: AC

## 2022-05-02 MED ORDER — HEPARIN SOD (PORK) LOCK FLUSH 100 UNIT/ML IV SOLN
500.0000 [IU] | Freq: Once | INTRAVENOUS | Status: AC
  Administered 2022-05-02: 500 [IU] via INTRAVENOUS

## 2022-05-02 NOTE — Patient Instructions (Signed)
[facility]  Discharge Instructions: Thank you for choosing [facility] to provide your oncology and hematology care.  If you have a lab appointment with the Cancer Center, please come in thru the Main Entrance and check in at the main information desk.  Wear comfortable clothing and clothing appropriate for easy access to any Portacath or PICC line.   We strive to give you quality time with your provider. You may need to reschedule your appointment if you arrive late (15 or more minutes).  Arriving late affects you and other patients whose appointments are after yours.  Also, if you miss three or more appointments without notifying the office, you may be dismissed from the clinic at the provider's discretion.      For prescription refill requests, have your pharmacy contact our office and allow 72 hours for refills to be completed.         To help prevent nausea and vomiting after your treatment, we encourage you to take your nausea medication as directed.  BELOW ARE SYMPTOMS THAT SHOULD BE REPORTED IMMEDIATELY: *FEVER GREATER THAN 100.4 F (38 C) OR HIGHER *CHILLS OR SWEATING *NAUSEA AND VOMITING THAT IS NOT CONTROLLED WITH YOUR NAUSEA MEDICATION *UNUSUAL SHORTNESS OF BREATH *UNUSUAL BRUISING OR BLEEDING *URINARY PROBLEMS (pain or burning when urinating, or frequent urination) *BOWEL PROBLEMS (unusual diarrhea, constipation, pain near the anus) TENDERNESS IN MOUTH AND THROAT WITH OR WITHOUT PRESENCE OF ULCERS (sore throat, sores in mouth, or a toothache) UNUSUAL RASH, SWELLING OR PAIN  UNUSUAL VAGINAL DISCHARGE OR ITCHING   Items with * indicate a potential emergency and should be followed up as soon as possible or go to the Emergency Department if any problems should occur.  Please show the CHEMOTHERAPY ALERT CARD or IMMUNOTHERAPY ALERT CARD at check-in to the Emergency Department and triage nurse.  Should you have questions after your visit or need to  cancel or reschedule your appointment, please contact [facility] [phone]  and follow the prompts.  Office hours are 8:00 a.m. to 4:30 p.m. Monday - Friday. Please note that voicemails left after 4:00 p.m. may not be returned until the following business day.  We are closed weekends and major holidays. You have access to a nurse at all times for urgent questions. Please call the main number to the clinic [phone] and follow the prompts.  For any non-urgent questions, you may also contact your provider using MyChart. We now offer e-Visits for anyone 18 and older to request care online for non-urgent symptoms. For details visit [email or web address].   Also download the MyChart app! Go to the app store, search "MyChart", open the app, select [facility], and log in with your MyChart username and password.  Masks are optional in the cancer centers. If you would like for your care team to wear a mask while they are taking care of you, please let them know. For doctor visits, patients may have with them one support person who is at least 63 years old. At this time, visitors are not allowed in the infusion area.  

## 2022-05-02 NOTE — Patient Instructions (Addendum)
[facility] Discharge Instructions  You were seen and examined today by Dr. Delton Coombes.  Dr. Delton Coombes will wait until the CT scan to adjust the dose of your medication. Continue taking Afinitor as prescribed.   Follow-up as scheduled after scan.    Thank you for choosing [facility] to provide your oncology and hematology care.  To afford each patient quality time with our provider, please arrive at least 15 minutes before your scheduled appointment time.   If you have a lab appointment with the [facility] please come in thru the Main Entrance and check in at the main information desk.  You need to re-schedule your appointment should you arrive 10 or more minutes late.  We strive to give you quality time with our providers, and arriving late affects you and other patients whose appointments are after yours.  Also, if you no show three or more times for appointments you may be dismissed from the clinic at the providers discretion.     Again, thank you for choosing [facility].  Our hope is that these requests will decrease the amount of time that you wait before being seen by our physicians.       _____________________________________________________________  Should you have questions after your visit to [facility], please contact our office at [phone] and follow the prompts.  Our office hours are 8:00 a.m. and 4:30 p.m. Monday - Friday.  Please note that voicemails left after 4:00 p.m. may not be returned until the following business day.  We are closed weekends and major holidays.  You do have access to a nurse 24-7, just call the main number to the clinic [phone] and do not press any options, hold on the line and a nurse will answer the phone.    For prescription refill requests, have your pharmacy contact our office and allow 72 hours.

## 2022-05-02 NOTE — Progress Notes (Signed)
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  TMB was low.  CCND3 amplified.  PBRM1 VUS present.  CURRENT THERAPY: Nivolumab + Ipilimumab q21d / Nivolumab q28d  BRIEF ONCOLOGIC HISTORY:  Oncology History  Renal cell cancer ([place])  06/20/2021 Initial Diagnosis   Renal cell carcinoma of right kidney ([place])   11/19/2021 -  Chemotherapy   Patient is on Treatment Plan : RENAL CELL CARCINOMA Nivolumab + Ipilimumab q21d / Nivolumab q28d      Genetic Testing   Negative genetic testing. No pathogenic variants identified on the [facility] Multi-Cancer Panel + RNA. VUS in PMS2 called c.1732C>A and in RECQL4 called c.2967G>A  Identified. The report date is 11/01/2021.   The Multi-Cancer Panel + RNA offered by [facility] includes sequencing and/or deletion duplication testing of the following 84 genes: AIP, ALK, APC, ATM, AXIN2,BAP1,  BARD1, BLM, BMPR1A, BRCA1, BRCA2, BRIP1, CASR, CDC73, CDH1, CDK4, CDKN1B, CDKN1C, CDKN2A (p14ARF), CDKN2A (p16INK4a), CEBPA, CHEK2, CTNNA1, DICER1, DIS3L2, EGFR (c.2369C>T, p.Thr790Met variant only), EPCAM (Deletion/duplication testing only), FH, FLCN, GATA2, GPC3, GREM1 (Promoter region deletion/duplication testing only), HOXB13 (c.251G>A, p.Gly84Glu), HRAS, KIT, MAX, MEN1, MET, MITF (c.952G>A, p.Glu318Lys variant only), MLH1, MSH2, MSH3, MSH6, MUTYH, NBN, NF1, NF2, NTHL1, PALB2, PDGFRA, PHOX2B, PMS2, POLD1, POLE, POT1, PRKAR1A, PTCH1, PTEN, RAD50, RAD51C, RAD51D, RB1, RECQL4, RET, RUNX1, SDHAF2, SDHA (sequence changes only), SDHB, SDHC, SDHD, SMAD4, SMARCA4, SMARCB1, SMARCE1, STK11, SUFU, TERC, TERT, TMEM127, TP53, TSC1, TSC2, VHL, WRN and WT1.      CANCER STAGING:   Cancer Staging  Malignant neoplasm of female breast [facility]) Staging form: Breast, AJCC 6th Edition - Clinical: Stage I (T1b, N0, M0) - Signed by Baird Cancer, PA on 08/16/2011  Renal cell cancer [facility]) Staging form: Kidney, AJCC 8th Edition - Clinical stage from 09/26/2021: Stage IV (cT3c, cNX, cM1) - Unsigned   INTERVAL HISTORY:  Deanna Bush, a 61 y.o. female, returns for routine follow-up of her metastatic clear-cell renal cell carcinoma. Deanna Bush was last seen on 04/02/2022.   Today she reports feeling good. She reports her energy is good. She denies sores on her mouth or hands, and she denies skin peeling. She continues to take Synthroid. She is taking an iron tablet, but she reports she is not taking it daily. She denies ankle swellings, nausea, vomiting, and lower back pain. She denies dysuria and chills. Her appetite is good.   REVIEW OF SYSTEMS:  Review of Systems  Constitutional:  Negative for appetite change, chills and fatigue.  HENT:   Negative for mouth sores.   Cardiovascular:  Negative for leg swelling.  Gastrointestinal:  Negative for nausea and vomiting.  Genitourinary:  Negative for dysuria.   Musculoskeletal:  Negative for back pain.  All other systems reviewed and are negative.   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[place])    left    Cellulitis of leg, left    Complication of anesthesia    Hard to wake up   Diabetes mellitus without complication ([place])    Pt denies   Family history of breast cancer 08/16/2011   Family history of breast cancer    Family history of  colon cancer    Family history of kidney cancer    Family history of prostate cancer    Hyperlipidemia    Hypertension    Hypothyroidism    MRSA (methicillin resistant staph aureus) culture positive 08/19/2011   Pre-diabetes    Past Surgical History:  Procedure Laterality Date   BREAST SURGERY Left    mastectomy   CESAREAN  SECTION     x2   COLONOSCOPY N/A 03/03/2020   Procedure: COLONOSCOPY;  Surgeon: Daneil Dolin, MD;  Location: [facility];  Service: Endoscopy;  Laterality: N/A;  12:00   IR IMAGING GUIDED PORT INSERTION  11/07/2021   left mastectomy     NEPHRECTOMY Right 02/06/2021   Procedure: NEPHRECTOMY- open radical;  Surgeon: Cleon Gustin, MD;  Location: [facility];  Service: Urology;  Laterality: Right;   POLYPECTOMY  03/03/2020   Procedure: POLYPECTOMY;  Surgeon: Daneil Dolin, MD;  Location: [facility];  Service: Endoscopy;;    SOCIAL HISTORY:  Social History   Socioeconomic History   Marital status: Married    Spouse name: Not on file   Number of children: 2   Years of education: Not on file   Highest education level: Not on file  Occupational History   Occupation: CNA  Tobacco Use   Smoking status: Former    Packs/day: 0.50    Years: 18.00    Total pack years: 9.00    Types: Cigarettes    Quit date: 07/01/2002    Years since quitting: 19.8   Smokeless tobacco: Never  Vaping Use   Vaping Use: Never used  Substance and Sexual Activity   Alcohol use: No   Drug use: No   Sexual activity: Not Currently  Other Topics Concern   Not on file  Social History Narrative   Not on file   Social Determinants of Health   Financial Resource Strain: Not on file  Food Insecurity: Not on file  Transportation Needs: Not on file  Physical Activity: Not on file  Stress: Not on file  Social Connections: Not on file  Intimate Partner Violence: Not on file    FAMILY HISTORY:  Family History  Problem Relation Age of Onset   Hypertension Mother    Diabetes Mother    Hyperlipidemia Mother    Colon cancer Maternal Aunt        dx 59s   Prostate cancer Maternal Uncle    Throat cancer Maternal Uncle    Kidney cancer Maternal Grandfather    Lung cancer Half-Sister    Kidney cancer Half-Sister 55   Breast cancer Half-Sister 105   Breast cancer Half-Sister 67    CURRENT MEDICATIONS:   Current Outpatient Medications  Medication Sig Dispense Refill   acetaminophen (TYLENOL) 500 MG tablet Take 500 mg by mouth every 6 (six) hours as needed for moderate pain or headache.     amLODipine (NORVASC) 5 MG tablet TAKE 1 TABLET (5 MG TOTAL) BY MOUTH DAILY. 90 tablet 3   atorvastatin (LIPITOR) 10 MG tablet TAKE ONE TABLET BY MOUTH ONCE DAILY 90 tablet 3   EPINEPHrine 0.3 mg/0.3 mL IJ SOAJ injection Inject 0.3 mg into the muscle as needed for anaphylaxis. 2 each 0   everolimus (AFINITOR) 5 MG tablet Take 1 tablet (5 mg total) by mouth daily. 30 tablet 3   Iron, Ferrous Sulfate, 325 (65 Fe) MG TABS Take 325 mg by mouth daily. 30 tablet 3   lenvatinib 12 mg daily dose (LENVIMA, 12  MG DAILY DOSE,) 3 x 4 MG capsule Take 12 mg by mouth daily. 90 capsule 0   levothyroxine (SYNTHROID) 100 MCG tablet Take 1 tablet (100 mcg total) by mouth daily before breakfast. 90 tablet 1   lidocaine-prilocaine (EMLA) cream Apply to affected area once 30 g 3   Multiple Vitamin (MULTIVITAMIN WITH MINERALS) TABS tablet Take 1 tablet by mouth daily.     prochlorperazine (COMPAZINE) 10 MG tablet Take 1 tablet (10 mg total) by mouth every 6 (six) hours as needed for nausea or vomiting. 30 tablet 6   UNABLE TO FIND MASTECTOMY BRA AND PROTHESIS DX Z90.12 6 each 2   UNABLE TO FIND 6 mastectomy prosthesis and bras. 6 each 0   No current facility-administered medications for this visit.   Facility-Administered Medications Ordered in Other Visits  Medication Dose Route Frequency Provider Last Rate Last Admin   sodium chloride flush (NS) 0.9 % injection 10 mL  10 mL Intravenous PRN Derek Jack, MD   10 mL at 05/02/22 1504    ALLERGIES:  Allergies  Allergen Reactions   Sulfonamide Derivatives Itching   Yellow Jacket Venom [Bee Venom] Rash    urticaria    PHYSICAL EXAM:  Performance status (ECOG): 0 - Asymptomatic  There were no vitals filed for this visit. Wt Readings from Last 3 Encounters:   05/02/22 178 lb 6.4 oz (80.9 kg)  04/17/22 178 lb 0.6 oz (80.8 kg)  04/02/22 180 lb 6.4 oz (81.8 kg)   Physical Exam Vitals reviewed.  Constitutional:      Appearance: Normal appearance.  Cardiovascular:     Rate and Rhythm: Normal rate and regular rhythm.     Pulses: Normal pulses.     Heart sounds: Normal heart sounds.  Pulmonary:     Effort: Pulmonary effort is normal.     Breath sounds: Normal breath sounds.  Musculoskeletal:     Right lower leg: No edema.     Left lower leg: No edema.  Skin:    Findings: No erythema. Rash is not vesicular.     Comments: Skin peeling on both palms  Neurological:     General: No focal deficit present.     Mental Status: She is alert and oriented to person, place, and time.  Psychiatric:        Mood and Affect: Mood normal.        Behavior: Behavior normal.      LABORATORY DATA:  I have reviewed the labs as listed.     Latest Ref Rng & Units 04/02/2022    9:18 AM 03/18/2022   10:11 AM 02/21/2022    8:51 AM  CBC  WBC 4.0 - 10.5 K/uL 3.5  6.0  5.9   Hemoglobin 12.0 - 15.0 g/dL 8.9  8.9  8.8   Hematocrit 36.0 - 46.0 % 29.2  28.4  28.2   Platelets 150 - 400 K/uL 300  454  417       Latest Ref Rng & Units 04/02/2022    9:18 AM 03/18/2022   10:11 AM 02/21/2022    8:51 AM  CMP  Glucose 70 - 99 mg/dL 147  165  112   BUN 8 - 23 mg/dL _0 Creatinine 0.44 - 1.00 mg/dL 0.98  0.97  1.03   Sodium 135 - 145 mmol/L 135  132  137   Potassium 3.5 - 5.1 mmol/L 3.9  3.8  4.1   Chloride 98 - 111 mmol/L 103  101  104   CO2 22 - 32 mmol/L _0 Calcium 8.9 - 10.3 mg/dL 8.8  8.7  9.1   Total Protein 6.5 - 8.1 g/dL 7.6  7.7  7.9   Total Bilirubin 0.3 - 1.2 mg/dL 0.4  0.4  0.2   Alkaline Phos 38 - 126 U/L 128  270  118   AST 15 - 41 U/L 20  40  20   ALT 0 - 44 U/L 12  40  14     DIAGNOSTIC IMAGING:  I have independently reviewed the scans and discussed with the patient. MM 3D SCREEN BREAST UNI RIGHT  Result Date:  05/01/2022 CLINICAL DATA:  Screening. EXAM: DIGITAL SCREENING UNILATERAL RIGHT MAMMOGRAM WITH CAD AND TOMOSYNTHESIS TECHNIQUE: Right screening digital craniocaudal and mediolateral oblique mammograms were obtained. Right screening digital breast tomosynthesis was performed. The images were evaluated with computer-aided detection. COMPARISON:  Previous exam(s). ACR Breast Density Category c: The breast tissue is heterogeneously dense, which may obscure small masses. FINDINGS: The patient has had a left mastectomy. There are no findings suspicious for malignancy. IMPRESSION: No mammographic evidence of malignancy. A result letter of this screening mammogram will be mailed directly to the patient. RECOMMENDATION: Screening mammogram in one year.  (Code:SM-R-33M) BI-RADS CATEGORY  1: Negative. Electronically Signed   By: Audie Pinto M.D.   On: 05/01/2022 08:39     ASSESSMENT:  Metastatic clear-cell renal cell carcinoma: - Right radical nephrectomy on 02/06/2021. - Pathology shows clear-cell RCC, grade 4, 12.5 cm, necrosis and rhabdoid features are identified.  Tumor extends into the and invades the wall of the vena cava (pT3c).  Vascular, ureteral and all margins of resection are negative for tumor. - CT scan abdomen with and without contrast on 05/29/2021 showed several nodules along the peritoneal surface of the right nephrectomy bed warranting close attention. - CTAP with and without contrast on 09/18/2021 showed progressive nodularity within the right nephrectomy bed, with enlarged nodules adjacent to the right hepatic lobe extending inferiorly along the right retroperitoneum with multiple enlarged enhancing nodules.  Direct metastatic invasion into the right hepatic lobe.  Small nodule at the right lung base favored to be benign. - IMDC: Intermediate risk group with 2 features including diagnosis less than 1 year and hemoglobin lower than normal. - Bone scan on 10/05/2021 with focal activity in the  mental vertex of the mandible which could be inflammatory/traumatic/metastatic. - CT chest on 10/03/2021 showed several lung nodules scattered bilaterally some of which could be metastatic and many others could be due to mucoid impaction. - NGS testing did not show any targetable mutations.  TMB was low.  CCND3 amplified.  PBRM1 VUS present. - Right nephrectomy bed biopsy (10/22/2021): Clear-cell renal cell carcinoma with necrosis - 4 cycles of ipilimumab and nivolumab from 11/19/2021 through 01/24/2022 - CT CAP on 02/14/2022: Numerous new and enlarged lung nodules.  Significant interval increase in the mass in the right renal bed.  New hypodense liver lesions. - Lenvatinib (12 mg) and everolimus 5 mg daily started on 03/13/2022    Social/family history: - She lives at home with her husband and also takes care of her mother. - She works as a Buyer, retail at [employer].  She quit smoking in 2004 and smoked half pack per day for 10 years. - Half sister (same mother) had kidney cancer at age 13.  Maternal grandfather had kidney cancer.  Sister died of lung cancer.  2 half  sisters (same father) had breast cancers.  3.  Left breast stage I tubular adenocarcinoma: - She underwent left mastectomy on 06/11/2002.  0/6 lymph nodes positive.  ER 90%, PR 92%, Ki-67 6%, HER2 negative.  As per chart, declined antiestrogen therapy.   PLAN:  Metastatic clear-cell RCC, intermediate risk by IMDC: - She is taking lenvatinib 12 mg daily and everolimus 5 mg daily. - She has some skin peeling in the palms.  No blistering or erythema.  No pain.  No changes in the soles.  No mucositis.  No diarrhea or severe fatigue.  She continues to work full-time. - Reviewed labs which showed normal LFTs.  Creatinine is mildly elevated at 1.13.  White count and platelet count was normal. - Continue lenvatinib 12 mg daily and everolimus 5 mg daily at the same dose. - She has CT scan scheduled on 05/20/2022. - She is going on  a cruise and will return back on 06/09/2022.  We will see her back 1 to 2 days after her return.  2.  Normocytic anemia: - Continue iron tablet daily.  Partly from myelosuppression.  Hemoglobin today is 9.4.  3.  Hypothyroidism: - Continue Synthroid 100 mcg daily.  Last TSH was 10.3.    Orders placed this encounter:  No orders of the defined types were placed in this encounter.    Derek Jack, MD [facility] [phone]   I, Thana Ates, am acting as a scribe for Dr. Derek Jack.  I, Derek Jack MD, have reviewed the above documentation for accuracy and completeness, and I agree with the above.

## 2022-05-02 NOTE — Progress Notes (Signed)
Patients port flushed without difficulty.  Good blood return noted with no bruising or swelling noted at site.  Band aid applied.  VSS with discharge and left in satisfactory condition with no s/s of distress noted.   

## 2022-05-10 ENCOUNTER — Other Ambulatory Visit: Payer: Self-pay | Admitting: Internal Medicine

## 2022-05-10 ENCOUNTER — Other Ambulatory Visit (HOSPITAL_COMMUNITY): Payer: Self-pay | Admitting: Hematology

## 2022-05-10 DIAGNOSIS — I10 Essential (primary) hypertension: Secondary | ICD-10-CM

## 2022-05-20 ENCOUNTER — Ambulatory Visit (HOSPITAL_COMMUNITY)
Admission: RE | Admit: 2022-05-20 | Discharge: 2022-05-20 | Disposition: A | Discharge status: Home / Self Care | Payer: No Typology Code available for payment source | Source: Ambulatory Visit | Attending: Hematology | Admitting: Hematology

## 2022-05-20 ENCOUNTER — Encounter (HOSPITAL_COMMUNITY): Payer: Self-pay | Admitting: Radiology

## 2022-05-20 DIAGNOSIS — C641 Malignant neoplasm of right kidney, except renal pelvis: Secondary | ICD-10-CM | POA: Insufficient documentation

## 2022-05-20 MED ORDER — HEPARIN SOD (PORK) LOCK FLUSH 100 UNIT/ML IV SOLN
INTRAVENOUS | Status: AC
  Administered 2022-05-20: 500 [IU] via INTRAVENOUS
  Filled 2022-05-20: qty 5

## 2022-05-20 MED ORDER — IOHEXOL 300 MG/ML  SOLN
100.0000 mL | Freq: Once | INTRAMUSCULAR | Status: AC | PRN
  Administered 2022-05-20: 100 mL via INTRAVENOUS

## 2022-05-23 ENCOUNTER — Other Ambulatory Visit: Payer: Self-pay | Admitting: Hematology

## 2022-05-23 DIAGNOSIS — C641 Malignant neoplasm of right kidney, except renal pelvis: Secondary | ICD-10-CM

## 2022-05-24 ENCOUNTER — Encounter: Payer: Self-pay | Admitting: Hematology

## 2022-06-06 ENCOUNTER — Other Ambulatory Visit (HOSPITAL_COMMUNITY): Payer: Self-pay

## 2022-06-08 ENCOUNTER — Other Ambulatory Visit: Payer: Self-pay | Admitting: Hematology

## 2022-06-11 ENCOUNTER — Inpatient Hospital Stay: Payer: PRIVATE HEALTH INSURANCE

## 2022-06-11 ENCOUNTER — Inpatient Hospital Stay: Payer: PRIVATE HEALTH INSURANCE | Attending: Hematology | Admitting: Hematology

## 2022-06-11 DIAGNOSIS — C787 Secondary malignant neoplasm of liver and intrahepatic bile duct: Secondary | ICD-10-CM | POA: Insufficient documentation

## 2022-06-11 DIAGNOSIS — Z8042 Family history of malignant neoplasm of prostate: Secondary | ICD-10-CM | POA: Insufficient documentation

## 2022-06-11 DIAGNOSIS — E119 Type 2 diabetes mellitus without complications: Secondary | ICD-10-CM | POA: Insufficient documentation

## 2022-06-11 DIAGNOSIS — E039 Hypothyroidism, unspecified: Secondary | ICD-10-CM | POA: Insufficient documentation

## 2022-06-11 DIAGNOSIS — Z801 Family history of malignant neoplasm of trachea, bronchus and lung: Secondary | ICD-10-CM | POA: Insufficient documentation

## 2022-06-11 DIAGNOSIS — C641 Malignant neoplasm of right kidney, except renal pelvis: Secondary | ICD-10-CM | POA: Insufficient documentation

## 2022-06-11 DIAGNOSIS — Z8051 Family history of malignant neoplasm of kidney: Secondary | ICD-10-CM | POA: Insufficient documentation

## 2022-06-11 DIAGNOSIS — D649 Anemia, unspecified: Secondary | ICD-10-CM | POA: Insufficient documentation

## 2022-06-11 DIAGNOSIS — I10 Essential (primary) hypertension: Secondary | ICD-10-CM | POA: Insufficient documentation

## 2022-06-11 DIAGNOSIS — Z800 Family history of malignant neoplasm of digestive organs: Secondary | ICD-10-CM | POA: Insufficient documentation

## 2022-06-11 DIAGNOSIS — Z803 Family history of malignant neoplasm of breast: Secondary | ICD-10-CM | POA: Insufficient documentation

## 2022-06-11 DIAGNOSIS — Z87891 Personal history of nicotine dependence: Secondary | ICD-10-CM | POA: Insufficient documentation

## 2022-06-11 DIAGNOSIS — Z853 Personal history of malignant neoplasm of breast: Secondary | ICD-10-CM | POA: Insufficient documentation

## 2022-06-11 DIAGNOSIS — Z905 Acquired absence of kidney: Secondary | ICD-10-CM | POA: Insufficient documentation

## 2022-06-11 MED ORDER — SODIUM CHLORIDE 0.9% FLUSH
10.0000 mL | Freq: Once | INTRAVENOUS | Status: AC
  Administered 2023-06-06: 3 mL via INTRAVENOUS

## 2022-06-11 MED ORDER — HEPARIN SOD (PORK) LOCK FLUSH 100 UNIT/ML IV SOLN: 500.0000 [IU] | Freq: Once | INTRAVENOUS | Status: AC

## 2022-06-11 NOTE — Progress Notes (Unsigned)
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  TMB was low.  CCND3 amplified.  PBRM1 VUS present.  CURRENT THERAPY: Nivolumab + Ipilimumab q21d / Nivolumab q28d  BRIEF ONCOLOGIC HISTORY:  Oncology History  Renal cell cancer ([place])  06/20/2021 Initial Diagnosis   Renal cell carcinoma of right kidney ([place])   11/19/2021 - 01/24/2022 Chemotherapy   Patient is on Treatment Plan : RENAL CELL CARCINOMA Nivolumab + Ipilimumab q21d / Nivolumab q28d      Genetic Testing   Negative genetic testing. No pathogenic variants identified on the [facility] Multi-Cancer Panel + RNA. VUS in PMS2 called c.1732C>A and in RECQL4 called c.2967G>A  Identified. The report date is 11/01/2021.   The Multi-Cancer Panel + RNA offered by [facility] includes sequencing and/or deletion duplication testing of the following 84 genes: AIP, ALK, APC, ATM, AXIN2,BAP1,  BARD1, BLM, BMPR1A, BRCA1, BRCA2, BRIP1, CASR, CDC73, CDH1, CDK4, CDKN1B, CDKN1C, CDKN2A (p14ARF), CDKN2A (p16INK4a), CEBPA, CHEK2, CTNNA1, DICER1, DIS3L2, EGFR (c.2369C>T, p.Thr790Met variant only), EPCAM (Deletion/duplication testing only), FH, FLCN, GATA2, GPC3, GREM1 (Promoter region deletion/duplication testing only), HOXB13 (c.251G>A, p.Gly84Glu), HRAS, KIT, MAX, MEN1, MET, MITF (c.952G>A, p.Glu318Lys variant only), MLH1, MSH2, MSH3, MSH6, MUTYH, NBN, NF1, NF2, NTHL1, PALB2, PDGFRA, PHOX2B, PMS2, POLD1, POLE, POT1, PRKAR1A, PTCH1, PTEN, RAD50, RAD51C, RAD51D, RB1, RECQL4, RET, RUNX1, SDHAF2, SDHA (sequence changes only), SDHB, SDHC, SDHD, SMAD4, SMARCA4, SMARCB1, SMARCE1, STK11, SUFU, TERC, TERT, TMEM127, TP53, TSC1, TSC2, VHL, WRN and WT1.      CANCER  STAGING:  Cancer Staging  Malignant neoplasm of female breast [facility] Staging form: Breast, AJCC 6th Edition - Clinical: Stage I (T1b, N0, M0) - Signed by Baird Cancer, PA on 08/16/2011  Renal cell cancer [facility]) Staging form: Kidney, AJCC 8th Edition - Clinical stage from 09/26/2021: Stage IV (cT3c, cNX, cM1) - Unsigned   INTERVAL HISTORY:  Deanna Bush, a 61 y.o. female, returns for routine follow-up of her metastatic clear-cell renal cell carcinoma. Deanna Bush was last seen on 04/02/2022.   Today Deanna Bush reports feeling good. Deanna Bush reports her energy is good. Deanna Bush denies sores on her mouth or hands, and Deanna Bush denies skin peeling. Deanna Bush continues to take Synthroid. Deanna Bush is taking an iron tablet, but Deanna Bush reports Deanna Bush is not taking it daily. Deanna Bush denies ankle swellings, nausea, vomiting, and lower back pain. Deanna Bush denies dysuria and chills. Her appetite is good.   REVIEW OF SYSTEMS:  Review of Systems  Constitutional:  Negative for appetite change, chills and fatigue.  HENT:   Negative for mouth sores.   Cardiovascular:  Negative for leg swelling.  Gastrointestinal:  Negative for nausea and vomiting.  Genitourinary:  Negative for dysuria.   Musculoskeletal:  Negative for back pain.  All other systems reviewed and are negative.   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[place])    left    Cellulitis of leg, left    Complication of anesthesia    Hard to wake up   Diabetes mellitus without complication ([place])    Pt denies   Family history of breast cancer 08/16/2011   Family history of breast cancer    Family history of  colon cancer    Family history of kidney cancer    Family history of prostate cancer    Hyperlipidemia    Hypertension    Hypothyroidism    MRSA (methicillin resistant staph aureus) culture positive 08/19/2011   Pre-diabetes    Past Surgical History:  Procedure Laterality Date   BREAST SURGERY Left    mastectomy    CESAREAN SECTION     x2   COLONOSCOPY N/A 03/03/2020   Procedure: COLONOSCOPY;  Surgeon: Daneil Dolin, MD;  Location: [facility];  Service: Endoscopy;  Laterality: N/A;  12:00   IR IMAGING GUIDED PORT INSERTION  11/07/2021   left mastectomy     NEPHRECTOMY Right 02/06/2021   Procedure: NEPHRECTOMY- open radical;  Surgeon: Cleon Gustin, MD;  Location: [facility];  Service: Urology;  Laterality: Right;   POLYPECTOMY  03/03/2020   Procedure: POLYPECTOMY;  Surgeon: Daneil Dolin, MD;  Location: [facility];  Service: Endoscopy;;    SOCIAL HISTORY:  Social History   Socioeconomic History   Marital status: Married    Spouse name: Not on file   Number of children: 2   Years of education: Not on file   Highest education level: Not on file  Occupational History   Occupation: CNA  Tobacco Use   Smoking status: Former    Packs/day: 0.50    Years: 18.00    Total pack years: 9.00    Types: Cigarettes    Quit date: 07/01/2002    Years since quitting: 19.9   Smokeless tobacco: Never  Vaping Use   Vaping Use: Never used  Substance and Sexual Activity   Alcohol use: No   Drug use: No   Sexual activity: Not Currently  Other Topics Concern   Not on file  Social History Narrative   Not on file   Social Determinants of Health   Financial Resource Strain: Not on file  Food Insecurity: Not on file  Transportation Needs: Not on file  Physical Activity: Not on file  Stress: Not on file  Social Connections: Not on file  Intimate Partner Violence: Not on file    FAMILY HISTORY:  Family History  Problem Relation Age of Onset   Hypertension Mother    Diabetes Mother    Hyperlipidemia Mother    Colon cancer Maternal Aunt        dx 24s   Prostate cancer Maternal Uncle    Throat cancer Maternal Uncle    Kidney cancer Maternal Grandfather    Lung cancer Half-Sister    Kidney cancer Half-Sister 104   Breast cancer Half-Sister 48   Breast cancer Half-Sister 55    CURRENT  MEDICATIONS:  Current Outpatient Medications  Medication Sig Dispense Refill   acetaminophen (TYLENOL) 500 MG tablet Take 500 mg by mouth every 6 (six) hours as needed for moderate pain or headache.     amLODipine (NORVASC) 5 MG tablet TAKE 1 TABLET BY MOUTH DAILY. 90 tablet 2   atorvastatin (LIPITOR) 10 MG tablet TAKE ONE TABLET BY MOUTH ONCE DAILY 90 tablet 3   EPINEPHrine 0.3 mg/0.3 mL IJ SOAJ injection Inject 0.3 mg into the muscle as needed for anaphylaxis. 2 each 0   everolimus (AFINITOR) 5 MG tablet Take 1 tablet (5 mg total) by mouth daily. 30 tablet 3   Iron, Ferrous Sulfate, 325 (65 Fe) MG TABS Take 325 mg by mouth daily. 30 tablet 3   LENVIMA, 12 MG DAILY DOSE, 3 x 4 MG capsule  Take three 40m capsules (132m by mouth daily as directed by your physician 90 each 1   levothyroxine (SYNTHROID) 100 MCG tablet Take 1 tablet (100 mcg total) by mouth daily before breakfast. 90 tablet 1   Multiple Vitamin (MULTIVITAMIN WITH MINERALS) TABS tablet Take 1 tablet by mouth daily.     prochlorperazine (COMPAZINE) 10 MG tablet TAKE 1 TABLET (10 MG TOTAL) BY MOUTH EVERY 6 (SIX) HOURS AS NEEDED FOR NAUSEA OR VOMITING. 210 tablet 2   UNABLE TO FIND MASTECTOMY BRA AND PROTHESIS DX Z90.12 6 each 2   UNABLE TO FIND 6 mastectomy prosthesis and bras. 6 each 0   urea (CARMOL) 10 % cream Apply topically 2 (two) times daily. Apply twice daily to hands 71 g 0   No current facility-administered medications for this visit.   Facility-Administered Medications Ordered in Other Visits  Medication Dose Route Frequency Provider Last Rate Last Admin   heparin lock flush 100 unit/mL  500 Units Intravenous Once KaDerek JackMD       sodium chloride flush (NS) 0.9 % injection 10 mL  10 mL Intravenous Once KaDerek JackMD        ALLERGIES:  Allergies  Allergen Reactions   Sulfonamide Derivatives Itching   Yellow Jacket Venom [Bee Venom] Rash    urticaria    PHYSICAL EXAM:  Performance status  (ECOG): 0 - Asymptomatic  There were no vitals filed for this visit. Wt Readings from Last 3 Encounters:  05/02/22 178 lb 6.4 oz (80.9 kg)  04/17/22 178 lb 0.6 oz (80.8 kg)  04/02/22 180 lb 6.4 oz (81.8 kg)   Physical Exam Vitals reviewed.  Constitutional:      Appearance: Normal appearance.  Cardiovascular:     Rate and Rhythm: Normal rate and regular rhythm.     Pulses: Normal pulses.     Heart sounds: Normal heart sounds.  Pulmonary:     Effort: Pulmonary effort is normal.     Breath sounds: Normal breath sounds.  Musculoskeletal:     Right lower leg: No edema.     Left lower leg: No edema.  Skin:    Findings: No erythema. Rash is not vesicular.     Comments: Skin peeling on both palms  Neurological:     General: No focal deficit present.     Mental Status: Deanna Bush is alert and oriented to person, place, and time.  Psychiatric:        Mood and Affect: Mood normal.        Behavior: Behavior normal.      LABORATORY DATA:  I have reviewed the labs as listed.     Latest Ref Rng & Units 05/02/2022    2:53 PM 04/02/2022    9:18 AM 03/18/2022   10:11 AM  CBC  WBC 4.0 - 10.5 K/uL 4.0  3.5  6.0   Hemoglobin 12.0 - 15.0 g/dL 9.4  8.9  8.9   Hematocrit 36.0 - 46.0 % 30.3  29.2  28.4   Platelets 150 - 400 K/uL 245  300  454       Latest Ref Rng & Units 05/02/2022    2:53 PM 04/02/2022    9:18 AM 03/18/2022   10:11 AM  CMP  Glucose 70 - 99 mg/dL 135  147  165   BUN 8 - 23 mg/dL _0 Creatinine 0.44 - 1.00 mg/dL 1.13  0.98  0.97   Sodium 135 - 145 mmol/L 137  135  132   Potassium 3.5 - 5.1 mmol/L 3.4  3.9  3.8   Chloride 98 - 111 mmol/L 108  103  101   CO2 22 - 32 mmol/L _0 Calcium 8.9 - 10.3 mg/dL 8.9  8.8  8.7   Total Protein 6.5 - 8.1 g/dL 7.1  7.6  7.7   Total Bilirubin 0.3 - 1.2 mg/dL 0.4  0.4  0.4   Alkaline Phos 38 - 126 U/L 92  128  270   AST 15 - 41 U/L 17  20  40   ALT 0 - 44 U/L 13  12  40     DIAGNOSTIC IMAGING:  I have independently  reviewed the scans and discussed with the patient. CT Abdomen Pelvis W Contrast  Result Date: 05/21/2022 CLINICAL DATA:  History of metastatic clear cell renal cell carcinoma status post right nephrectomy. Restaging. EXAM: CT ABDOMEN AND PELVIS WITH CONTRAST TECHNIQUE: Multidetector CT imaging of the abdomen and pelvis was performed using the standard protocol following bolus administration of intravenous contrast. RADIATION DOSE REDUCTION: This exam was performed according to the departmental dose-optimization program which includes automated exposure control, adjustment of the mA and/or kV according to patient size and/or use of iterative reconstruction technique. ***body onc*** CONTRAST:  135m OMNIPAQUE IOHEXOL 300 MG/ML  SOLN COMPARISON:  02/14/2022 FINDINGS: Lower chest: No acute abnormality. Previously noted nodules within the basal right upper lobe, medial left lung base and posterior right middle lobe have resolved in the interval. Partially visualize nodule within the anteromedial aspect of the inferior left upper lobe is again noted and appears similar to the previous exam. Interval improvement in previous right lower lobe atelectasis. Hepatobiliary: Multiple low-attenuation liver lesions are identified and appear increased in multiplicity compared with the previous exam. Index lesions include: -lesion within the posterior aspect of segment 4 of the left hepatic lobe measures 2.2 x 1.9 cm, image 71/9. This is not significantly changed in the interval. -There is a new hypodense lesion within segment 3 of the left hepatic lobe measuring 7 mm, image 72/9. -New is a low-density lesion at the junction between segments 4 and 2/3 which measures 0.9 cm, image 64/9. -The index lesion within segment 5/8 measures 1.2 x 0.9 cm, image 58/9. Previously this measured the same. -New low-attenuation lesion within segment 8 measures 0.9 x 1.0 cm, image 50/9. -Lastly within segment 7 there is a new low-density structure  measuring 6 mm, image 77/9. No gallstones, gallbladder wall thickening or inflammation. No intrahepatic bile duct dilatation. Common bile duct is mildly increased in caliber measuring 8 mm. This is compared with the same previously. Pancreas: Unremarkable. No pancreatic ductal dilatation or surrounding inflammatory changes. Spleen: Normal in size without focal abnormality. Adrenals/Urinary Tract: The left adrenal gland is unremarkable. No suspicious left kidney mass, nephrolithiasis or hydronephrosis. The right adrenal gland is not visualized. Status post right nephrectomy. Hypodense nodularity within the right retroperitoneum, hepatic renal recess and right pericolic gutter is again noted. Index nodules include: - high hepatorenal recess lesion measures 5.2 by 4.0, image 75/9. On the previous exam this measured 5.3 x 4.8. -Adjacent lesion measures 3.9 x 2.8 cm, image 90/9. Previously 5.1 x 4.0 cm. -right retroperitoneal mass measures 3.4 x 3.1 cm, image 123/9. Formally this measured 4.0 x 4.0 cm. -Index nodule along the right pericolic gutter measures 1.5 x 1.3 cm, image 125/9. Formally 1.9 x 1.5 cm. Stomach/Bowel: Stomach appears within normal limits. The appendix is visualized  and appears normal. No bowel wall thickening, inflammation, or distension. Vascular/Lymphatic: Aortic atherosclerosis. No enlarged abdominal or pelvic lymph nodes. Reproductive: Uterus and bilateral adnexa are unremarkable. Other: No free fluid or fluid collections. Musculoskeletal: No acute or significant osseous findings. IMPRESSION: 1. Status post right nephrectomy. 2. Previously noted hypodense nodularity throughout the right retroperitoneum, hepatorenal recess, and right paracolic gutter are again seen. Index lesions appear decreased in size compared with the previous exam. 3. Previously noted low-attenuation liver metastases are stable in the interval. There are several new low attenuation liver lesions which are suspicious for  metastatic disease. 4. The nodules within the basal right upper lobe, posterior right middle lobe and medial left base have resolved in the interval. Persistent nodule within the anteromedial aspect of the lower left upper lobe. 5. Aortic Atherosclerosis (ICD10-I70.0). Electronically Signed   By: Kerby Moors M.D.   On: 05/21/2022 12:30     ASSESSMENT:  Metastatic clear-cell renal cell carcinoma: - Right radical nephrectomy on 02/06/2021. - Pathology shows clear-cell RCC, grade 4, 12.5 cm, necrosis and rhabdoid features are identified.  Tumor extends into the and invades the wall of the vena cava (pT3c).  Vascular, ureteral and all margins of resection are negative for tumor. - CT scan abdomen with and without contrast on 05/29/2021 showed several nodules along the peritoneal surface of the right nephrectomy bed warranting close attention. - CTAP with and without contrast on 09/18/2021 showed progressive nodularity within the right nephrectomy bed, with enlarged nodules adjacent to the right hepatic lobe extending inferiorly along the right retroperitoneum with multiple enlarged enhancing nodules.  Direct metastatic invasion into the right hepatic lobe.  Small nodule at the right lung base favored to be benign. - IMDC: Intermediate risk group with 2 features including diagnosis less than 1 year and hemoglobin lower than normal. - Bone scan on 10/05/2021 with focal activity in the mental vertex of the mandible which could be inflammatory/traumatic/metastatic. - CT chest on 10/03/2021 showed several lung nodules scattered bilaterally some of which could be metastatic and many others could be due to mucoid impaction. - NGS testing did not show any targetable mutations.  TMB was low.  CCND3 amplified.  PBRM1 VUS present. - Right nephrectomy bed biopsy (10/22/2021): Clear-cell renal cell carcinoma with necrosis - 4 cycles of ipilimumab and nivolumab from 11/19/2021 through 01/24/2022 - CT CAP on 02/14/2022:  Numerous new and enlarged lung nodules.  Significant interval increase in the mass in the right renal bed.  New hypodense liver lesions. - Lenvatinib (12 mg) and everolimus 5 mg daily started on 03/13/2022    Social/family history: - Deanna Bush lives at home with her husband and also takes care of her mother. - Deanna Bush works as a Buyer, retail at [employer].  Deanna Bush quit smoking in 2004 and smoked half pack per day for 10 years. - Half sister (same mother) had kidney cancer at age 106.  Maternal grandfather had kidney cancer.  Sister died of lung cancer.  2 half sisters (same father) had breast cancers.  3.  Left breast stage I tubular adenocarcinoma: - Deanna Bush underwent left mastectomy on 06/11/2002.  0/6 lymph nodes positive.  ER 90%, PR 92%, Ki-67 6%, HER2 negative.  As per chart, declined antiestrogen therapy.   PLAN:  Metastatic clear-cell RCC, intermediate risk by IMDC: - Deanna Bush is taking lenvatinib 12 mg daily and everolimus 5 mg daily. - Deanna Bush has some skin peeling in the palms.  No blistering or erythema.  No pain.  No  changes in the soles.  No mucositis.  No diarrhea or severe fatigue.  Deanna Bush continues to work full-time. - Reviewed labs which showed normal LFTs.  Creatinine is mildly elevated at 1.13.  White count and platelet count was normal. - Continue lenvatinib 12 mg daily and everolimus 5 mg daily at the same dose. - Deanna Bush has CT scan scheduled on 05/20/2022. - Deanna Bush is going on a cruise and will return back on 06/09/2022.  We will see her back 1 to 2 days after her return.  2.  Normocytic anemia: - Continue iron tablet daily.  Partly from myelosuppression.  Hemoglobin today is 9.4.  3.  Hypothyroidism: - Continue Synthroid 100 mcg daily.  Last TSH was 10.3.    Orders placed this encounter:  No orders of the defined types were placed in this encounter.    Derek Jack, MD [place] [phone]   I, Thana Ates, am acting as a scribe for Dr. Derek Jack.  I, Derek Jack MD, have reviewed the above documentation for accuracy and completeness, and I agree with the above.

## 2022-06-12 ENCOUNTER — Inpatient Hospital Stay (HOSPITAL_BASED_OUTPATIENT_CLINIC_OR_DEPARTMENT_OTHER): Payer: PRIVATE HEALTH INSURANCE | Admitting: Hematology

## 2022-06-12 ENCOUNTER — Telehealth: Payer: No Typology Code available for payment source | Admitting: Hematology

## 2022-06-12 DIAGNOSIS — E039 Hypothyroidism, unspecified: Secondary | ICD-10-CM | POA: Diagnosis not present

## 2022-06-12 DIAGNOSIS — E119 Type 2 diabetes mellitus without complications: Secondary | ICD-10-CM | POA: Diagnosis not present

## 2022-06-12 DIAGNOSIS — C787 Secondary malignant neoplasm of liver and intrahepatic bile duct: Secondary | ICD-10-CM | POA: Diagnosis not present

## 2022-06-12 DIAGNOSIS — C641 Malignant neoplasm of right kidney, except renal pelvis: Secondary | ICD-10-CM

## 2022-06-12 DIAGNOSIS — Z87891 Personal history of nicotine dependence: Secondary | ICD-10-CM | POA: Diagnosis not present

## 2022-06-12 DIAGNOSIS — Z803 Family history of malignant neoplasm of breast: Secondary | ICD-10-CM | POA: Diagnosis not present

## 2022-06-12 DIAGNOSIS — Z853 Personal history of malignant neoplasm of breast: Secondary | ICD-10-CM | POA: Diagnosis not present

## 2022-06-12 DIAGNOSIS — I10 Essential (primary) hypertension: Secondary | ICD-10-CM | POA: Diagnosis not present

## 2022-06-12 DIAGNOSIS — Z801 Family history of malignant neoplasm of trachea, bronchus and lung: Secondary | ICD-10-CM | POA: Diagnosis not present

## 2022-06-12 DIAGNOSIS — Z905 Acquired absence of kidney: Secondary | ICD-10-CM | POA: Diagnosis not present

## 2022-06-12 DIAGNOSIS — Z800 Family history of malignant neoplasm of digestive organs: Secondary | ICD-10-CM | POA: Diagnosis not present

## 2022-06-12 DIAGNOSIS — D649 Anemia, unspecified: Secondary | ICD-10-CM | POA: Diagnosis not present

## 2022-06-12 DIAGNOSIS — Z8042 Family history of malignant neoplasm of prostate: Secondary | ICD-10-CM | POA: Diagnosis not present

## 2022-06-12 DIAGNOSIS — Z8051 Family history of malignant neoplasm of kidney: Secondary | ICD-10-CM | POA: Diagnosis not present

## 2022-06-12 LAB — CBC WITH DIFFERENTIAL/PLATELET
Abs Immature Granulocytes: 0.01 10*3/uL (ref 0.00–0.07)
Basophils Absolute: 0 10*3/uL (ref 0.0–0.1)
Basophils Relative: 0 %
Eosinophils Absolute: 0.1 10*3/uL (ref 0.0–0.5)
Eosinophils Relative: 1 %
HCT: 31.6 % — ABNORMAL LOW (ref 36.0–46.0)
Hemoglobin: 10 g/dL — ABNORMAL LOW (ref 12.0–15.0)
Immature Granulocytes: 0 %
Lymphocytes Relative: 36 %
Lymphs Abs: 1.7 10*3/uL (ref 0.7–4.0)
MCH: 26.4 pg (ref 26.0–34.0)
MCHC: 31.6 g/dL (ref 30.0–36.0)
MCV: 83.4 fL (ref 80.0–100.0)
Monocytes Absolute: 0.2 10*3/uL (ref 0.1–1.0)
Monocytes Relative: 5 %
Neutro Abs: 2.8 10*3/uL (ref 1.7–7.7)
Neutrophils Relative %: 58 %
Platelets: 223 10*3/uL (ref 150–400)
RBC: 3.79 MIL/uL — ABNORMAL LOW (ref 3.87–5.11)
RDW: 16.8 % — ABNORMAL HIGH (ref 11.5–15.5)
WBC: 4.8 10*3/uL (ref 4.0–10.5)
nRBC: 0 % (ref 0.0–0.2)

## 2022-06-12 LAB — MAGNESIUM: Magnesium: 2 mg/dL (ref 1.7–2.4)

## 2022-06-12 LAB — COMPREHENSIVE METABOLIC PANEL
ALT: 15 U/L (ref 0–44)
AST: 20 U/L (ref 15–41)
Albumin: 3.5 g/dL (ref 3.5–5.0)
Alkaline Phosphatase: 96 U/L (ref 38–126)
Anion gap: 7 (ref 5–15)
BUN: 15 mg/dL (ref 8–23)
CO2: 24 mmol/L (ref 22–32)
Calcium: 8.8 mg/dL — ABNORMAL LOW (ref 8.9–10.3)
Chloride: 105 mmol/L (ref 98–111)
Creatinine, Ser: 0.94 mg/dL (ref 0.44–1.00)
GFR, Estimated: >60 mL/min (ref >60)
Glucose, Bld: 107 mg/dL — ABNORMAL HIGH (ref 70–99)
Potassium: 3.7 mmol/L (ref 3.5–5.1)
Sodium: 136 mmol/L (ref 135–145)
Total Bilirubin: 0.6 mg/dL (ref 0.3–1.2)
Total Protein: 7.5 g/dL (ref 6.5–8.1)

## 2022-06-12 MED ORDER — LENVATINIB (18 MG DAILY DOSE) 10 MG & 2 X 4 MG PO CPPK: 18.0000 mg | ORAL_CAPSULE | Freq: Every day | ORAL | 2 refills | Status: DC

## 2022-06-12 MED ORDER — HEPARIN SOD (PORK) LOCK FLUSH 100 UNIT/ML IV SOLN
500.0000 [IU] | Freq: Once | INTRAVENOUS | Status: AC
  Administered 2022-06-12: 500 [IU] via INTRAVENOUS

## 2022-06-12 MED ORDER — SODIUM CHLORIDE 0.9% FLUSH
10.0000 mL | Freq: Once | INTRAVENOUS | Status: AC
  Administered 2022-06-12: 10 mL via INTRAVENOUS

## 2022-06-12 NOTE — Progress Notes (Signed)
Patients port flushed without difficulty.  Good blood return noted with no bruising or swelling noted at site.  Band aid applied.  VSS with discharge and left in satisfactory condition with no s/s of distress noted.   

## 2022-06-12 NOTE — Progress Notes (Signed)
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  TMB was low.  CCND3 amplified.  PBRM1 VUS present.  CURRENT THERAPY: Lenvatinib and everolimus  BRIEF ONCOLOGIC HISTORY:  Oncology History  Renal cell cancer ([name])  06/20/2021 Initial Diagnosis   Renal cell carcinoma of right kidney ([place])   11/19/2021 - 01/24/2022 Chemotherapy   Patient is on Treatment Plan : RENAL CELL CARCINOMA Nivolumab + Ipilimumab q21d / Nivolumab q28d      Genetic Testing   Negative genetic testing. No pathogenic variants identified on the [facility] Multi-Cancer Panel + RNA. VUS in PMS2 called c.1732C>A and in RECQL4 called c.2967G>A  Identified. The report date is 11/01/2021.   The Multi-Cancer Panel + RNA offered by [facility] includes sequencing and/or deletion duplication testing of the following 84 genes: AIP, ALK, APC, ATM, AXIN2,BAP1,  BARD1, BLM, BMPR1A, BRCA1, BRCA2, BRIP1, CASR, CDC73, CDH1, CDK4, CDKN1B, CDKN1C, CDKN2A (p14ARF), CDKN2A (p16INK4a), CEBPA, CHEK2, CTNNA1, DICER1, DIS3L2, EGFR (c.2369C>T, p.Thr790Met variant only), EPCAM (Deletion/duplication testing only), FH, FLCN, GATA2, GPC3, GREM1 (Promoter region deletion/duplication testing only), HOXB13 (c.251G>A, p.Gly84Glu), HRAS, KIT, MAX, MEN1, MET, MITF (c.952G>A, p.Glu318Lys variant only), MLH1, MSH2, MSH3, MSH6, MUTYH, NBN, NF1, NF2, NTHL1, PALB2, PDGFRA, PHOX2B, PMS2, POLD1, POLE, POT1, PRKAR1A, PTCH1, PTEN, RAD50, RAD51C, RAD51D, RB1, RECQL4, RET, RUNX1, SDHAF2, SDHA (sequence changes only), SDHB, SDHC, SDHD, SMAD4, SMARCA4, SMARCB1, SMARCE1, STK11, SUFU, TERC, TERT, TMEM127, TP53, TSC1, TSC2, VHL,  WRN and WT1.      CANCER STAGING:  Cancer Staging  Malignant neoplasm of female breast [facility]) Staging form: Breast, AJCC 6th Edition - Clinical: Stage I (T1b, N0, M0) - Signed by Baird Cancer, PA on 08/16/2011  Renal cell cancer [facility]) Staging form: Kidney, AJCC 8th Edition - Clinical stage from 09/26/2021: Stage IV [name], cNX, cM1) - Unsigned   INTERVAL HISTORY:  [name], a 61 y.o. female, seen for follow-up of metastatic clear-cell renal cell carcinoma.  She is continuing to tolerate lenvatinib and everolimus very well.  She recently came back from a cruise trip to [place] and really enjoyed it.  She had good energy levels.  Did not have any GI side effects.  REVIEW OF SYSTEMS:  Review of Systems  Constitutional:  Negative for appetite change, chills and fatigue.  HENT:   Negative for mouth sores.   Cardiovascular:  Negative for leg swelling.  Gastrointestinal:  Negative for nausea and vomiting.  Genitourinary:  Negative for dysuria.   Musculoskeletal:  Negative for back pain.  All other systems reviewed and are negative.   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[place])    left    Cellulitis of leg, left    Complication of anesthesia    Hard to wake up   Diabetes mellitus without complication ([place])    Pt denies   Family history of breast cancer 08/16/2011   Family history of breast cancer    Family history of colon cancer    Family history of kidney cancer    Family history of prostate cancer    Hyperlipidemia    Hypertension    Hypothyroidism  MRSA (methicillin resistant staph aureus) culture positive 08/19/2011   Pre-diabetes    Past Surgical History:  Procedure Laterality Date   BREAST SURGERY Left    mastectomy   CESAREAN SECTION     x2   COLONOSCOPY N/A 03/03/2020   Procedure: COLONOSCOPY;  Surgeon: Daneil Dolin, MD;  Location: [facility];  Service: Endoscopy;  Laterality:  N/A;  12:00   IR IMAGING GUIDED PORT INSERTION  11/07/2021   left mastectomy     NEPHRECTOMY Right 02/06/2021   Procedure: NEPHRECTOMY- open radical;  Surgeon: Cleon Gustin, MD;  Location: [facility];  Service: Urology;  Laterality: Right;   POLYPECTOMY  03/03/2020   Procedure: POLYPECTOMY;  Surgeon: Daneil Dolin, MD;  Location: [facility];  Service: Endoscopy;;    SOCIAL HISTORY:  Social History   Socioeconomic History   Marital status: Married    Spouse name: Not on file   Number of children: 2   Years of education: Not on file   Highest education level: Not on file  Occupational History   Occupation: CNA  Tobacco Use   Smoking status: Former    Packs/day: 0.50    Years: 18.00    Total pack years: 9.00    Types: Cigarettes    Quit date: 07/01/2002    Years since quitting: 19.9   Smokeless tobacco: Never  Vaping Use   Vaping Use: Never used  Substance and Sexual Activity   Alcohol use: No   Drug use: No   Sexual activity: Not Currently  Other Topics Concern   Not on file  Social History Narrative   Not on file   Social Determinants of Health   Financial Resource Strain: Not on file  Food Insecurity: Not on file  Transportation Needs: Not on file  Physical Activity: Not on file  Stress: Not on file  Social Connections: Not on file  Intimate Partner Violence: Not on file    FAMILY HISTORY:  Family History  Problem Relation Age of Onset   Hypertension Mother    Diabetes Mother    Hyperlipidemia Mother    Colon cancer Maternal Aunt        dx 60s   Prostate cancer Maternal Uncle    Throat cancer Maternal Uncle    Kidney cancer Maternal Grandfather    Lung cancer Half-Sister    Kidney cancer Half-Sister 83   Breast cancer Half-Sister 25   Breast cancer Half-Sister 92    CURRENT MEDICATIONS:  Current Outpatient Medications  Medication Sig Dispense Refill   acetaminophen (TYLENOL) 500 MG tablet Take 500 mg by mouth every 6 (six) hours as needed for  moderate pain or headache.     amLODipine (NORVASC) 5 MG tablet TAKE 1 TABLET BY MOUTH DAILY. 90 tablet 2   atorvastatin (LIPITOR) 10 MG tablet TAKE ONE TABLET BY MOUTH ONCE DAILY 90 tablet 3   EPINEPHrine 0.3 mg/0.3 mL IJ SOAJ injection Inject 0.3 mg into the muscle as needed for anaphylaxis. 2 each 0   everolimus (AFINITOR) 5 MG tablet Take 1 tablet (5 mg total) by mouth daily. 30 tablet 3   Iron, Ferrous Sulfate, 325 (65 Fe) MG TABS Take 325 mg by mouth daily. 30 tablet 3   LENVIMA, 12 MG DAILY DOSE, 3 x 4 MG capsule Take three 75m capsules (141m by mouth daily as directed by your physician 90 each 1   levothyroxine (SYNTHROID) 100 MCG tablet Take 1 tablet (100 mcg total) by mouth daily before  breakfast. 90 tablet 1   Multiple Vitamin (MULTIVITAMIN WITH MINERALS) TABS tablet Take 1 tablet by mouth daily.     prochlorperazine (COMPAZINE) 10 MG tablet TAKE 1 TABLET (10 MG TOTAL) BY MOUTH EVERY 6 (SIX) HOURS AS NEEDED FOR NAUSEA OR VOMITING. 210 tablet 2   UNABLE TO FIND MASTECTOMY BRA AND PROTHESIS DX Z90.12 6 each 2   UNABLE TO FIND 6 mastectomy prosthesis and bras. 6 each 0   urea (CARMOL) 10 % cream Apply topically 2 (two) times daily. Apply twice daily to hands 71 g 0   No current facility-administered medications for this visit.   Facility-Administered Medications Ordered in Other Visits  Medication Dose Route Frequency Provider Last Rate Last Admin   heparin lock flush 100 unit/mL  500 Units Intravenous Once Derek Jack, MD       sodium chloride flush (NS) 0.9 % injection 10 mL  10 mL Intravenous Once Derek Jack, MD        ALLERGIES:  Allergies  Allergen Reactions   Sulfonamide Derivatives Itching   Yellow Jacket Venom [Bee Venom] Rash    urticaria    PHYSICAL EXAM:  Performance status (ECOG): 0 - Asymptomatic  Vitals:   06/12/22 1024  BP: 135/79  Pulse: 66  Resp: 18  Temp: 98 F (36.7 C)  SpO2: 99%   Wt Readings from Last 3 Encounters:  06/12/22  176 lb 14.4 oz (80.2 kg)  05/02/22 178 lb 6.4 oz (80.9 kg)  04/17/22 178 lb 0.6 oz (80.8 kg)   Physical Exam Vitals reviewed.  Constitutional:      Appearance: Normal appearance.  Cardiovascular:     Rate and Rhythm: Normal rate and regular rhythm.     Pulses: Normal pulses.     Heart sounds: Normal heart sounds.  Pulmonary:     Effort: Pulmonary effort is normal.     Breath sounds: Normal breath sounds.  Musculoskeletal:     Right lower leg: No edema.     Left lower leg: No edema.  Skin:    Findings: No erythema. Rash is not vesicular.     Comments: Skin peeling on both palms  Neurological:     General: No focal deficit present.     Mental Status: She is alert and oriented to person, place, and time.  Psychiatric:        Mood and Affect: Mood normal.        Behavior: Behavior normal.      LABORATORY DATA:  I have reviewed the labs as listed.     Latest Ref Rng & Units 06/12/2022   10:57 AM 05/02/2022    2:53 PM 04/02/2022    9:18 AM  CBC  WBC 4.0 - 10.5 K/uL 4.8  4.0  3.5   Hemoglobin 12.0 - 15.0 g/dL 10.0  9.4  8.9   Hematocrit 36.0 - 46.0 % 31.6  30.3  29.2   Platelets 150 - 400 K/uL 223  245  300       Latest Ref Rng & Units 06/12/2022   10:57 AM 05/02/2022    2:53 PM 04/02/2022    9:18 AM  CMP  Glucose 70 - 99 mg/dL 107  135  147   BUN 8 - 23 mg/dL 15  15  13    Creatinine 0.44 - 1.00 mg/dL 0.94  1.13  0.98   Sodium 135 - 145 mmol/L 136  137  135   Potassium 3.5 - 5.1 mmol/L 3.7  3.4  3.9  Chloride 98 - 111 mmol/L 105  108  103   CO2 22 - 32 mmol/L 24  25  25    Calcium 8.9 - 10.3 mg/dL 8.8  8.9  8.8   Total Protein 6.5 - 8.1 g/dL 7.5  7.1  7.6   Total Bilirubin 0.3 - 1.2 mg/dL 0.6  0.4  0.4   Alkaline Phos 38 - 126 U/L 96  92  128   AST 15 - 41 U/L 20  17  20    ALT 0 - 44 U/L 15  13  12      DIAGNOSTIC IMAGING:  I have independently reviewed the scans and discussed with the patient.    ASSESSMENT:  Metastatic clear-cell renal cell carcinoma: - Right  radical nephrectomy on 02/06/2021. - Pathology shows clear-cell RCC, grade 4, 12.5 cm, necrosis and rhabdoid features are identified.  Tumor extends into the and invades the wall of the vena cava (pT3c).  Vascular, ureteral and all margins of resection are negative for tumor. - CT scan abdomen with and without contrast on 05/29/2021 showed several nodules along the peritoneal surface of the right nephrectomy bed warranting close attention. - CTAP with and without contrast on 09/18/2021 showed progressive nodularity within the right nephrectomy bed, with enlarged nodules adjacent to the right hepatic lobe extending inferiorly along the right retroperitoneum with multiple enlarged enhancing nodules.  Direct metastatic invasion into the right hepatic lobe.  Small nodule at the right lung base favored to be benign. - IMDC: Intermediate risk group with 2 features including diagnosis less than 1 year and hemoglobin lower than normal. - Bone scan on 10/05/2021 with focal activity in the mental vertex of the mandible which could be inflammatory/traumatic/metastatic. - CT chest on 10/03/2021 showed several lung nodules scattered bilaterally some of which could be metastatic and many others could be due to mucoid impaction. - NGS testing did not show any targetable mutations.  TMB was low.  CCND3 amplified.  PBRM1 VUS present. - Right nephrectomy bed biopsy (10/22/2021): Clear-cell renal cell carcinoma with necrosis - 4 cycles of ipilimumab and nivolumab from 11/19/2021 through 01/24/2022 - CT CAP on 02/14/2022: Numerous new and enlarged lung nodules.  Significant interval increase in the mass in the right renal bed.  New hypodense liver lesions. - Lenvatinib (12 mg) and everolimus 5 mg daily started on 03/13/2022    Social/family history: - She lives at home with her husband and also takes care of her mother. - She works as a Buyer, retail at [employer].  She quit smoking in 2004 and smoked half pack per  day for 10 years. - Half sister (same mother) had kidney cancer at age 21.  Maternal grandfather had kidney cancer.  Sister died of lung cancer.  2 half sisters (same father) had breast cancers.  3.  Left breast stage I tubular adenocarcinoma: - She underwent left mastectomy on 06/11/2002.  0/6 lymph nodes positive.  ER 90%, PR 92%, Ki-67 6%, HER2 negative.  As per chart, declined antiestrogen therapy.   PLAN:  Metastatic clear-cell RCC, intermediate risk by IMDC: - She is taking lenvatinib 12 mg daily and everolimus 5 mg daily. - No GI side effects.  No mucositis or skin peeling. - Reviewed labs which showed near normal CBC with mild anemia.  CMP was normal with normal LFTs and creatinine. - Reviewed CT CAP from 05/20/2022: - She has CT scan scheduled on 05/20/2022: Hypodense nodularity throughout the right retroperitoneum, hepatorenal recess and right paracolic gutter improved.  Low-attenuation liver metastasis are stable.  There are few new low-attenuation liver lesions suspicious for metastatic disease but all are subcentimeter.  Nodules within the basal right upper lobe, posterior right middle lobe and medial left base have resolved in the interval. - I have recommended continuation of combination lenvatinib and everolimus at the same time and plan to repeat scans in 3 months.  I have recommended increasing the dose of lenvatinib to 18 mg starting next cycle.  I will continue same dose of everolimus.  She has 3 more weeks of lenvatinib to take.  She will come back in 8 weeks with repeat labs.  2.  Normocytic anemia: - Continue iron tablet daily.  Partly from myelosuppression.  Hemoglobin today is 9.4.  3.  Hypothyroidism: - Continue Synthroid 100 mcg daily.  Last TSH was 10.3.    Orders placed this encounter:  Orders Placed This Encounter  Procedures   CT Abdomen Pelvis [place], MD [place] [phone]   I, Thana Ates, am acting as  a scribe for Dr. Derek Jack.  I, Derek Jack MD, have reviewed the above documentation for accuracy and completeness, and I agree with the above.

## 2022-06-12 NOTE — Patient Instructions (Addendum)
[place] at [facility] Discharge Instructions   You were seen and examined today by Dr. Delton Coombes.  He reviewed the results of your CT scan which shows your cancer has improved. We will continue with the same treatment plan.   Lab work from today is pending.   We will repeat a CT scan in 3 months.   Return as scheduled.    Thank you for choosing [place] at [facility] to provide your oncology and hematology care.  To afford each patient quality time with our provider, please arrive at least 15 minutes before your scheduled appointment time.   If you have a lab appointment with the [facility] please come in thru the Main Entrance and check in at the main information desk.  You need to re-schedule your appointment should you arrive 10 or more minutes late.  We strive to give you quality time with our providers, and arriving late affects you and other patients whose appointments are after yours.  Also, if you no show three or more times for appointments you may be dismissed from the clinic at the providers discretion.     Again, thank you for choosing [facility].  Our hope is that these requests will decrease the amount of time that you wait before being seen by our physicians.       _____________________________________________________________  Should you have questions after your visit to [facility], please contact our office at [phone] and follow the prompts.  Our office hours are 8:00 a.m. and 4:30 p.m. Monday - Friday.  Please note that voicemails left after 4:00 p.m. may not be returned until the following business day.  We are closed weekends and major holidays.  You do have access to a nurse 24-7, just call the main number to the clinic [phone] and do not press any options, hold on the line and a nurse will answer the phone.    For prescription refill requests, have your pharmacy contact our  office and allow 72 hours.    Due to Covid, you will need to wear a mask upon entering the hospital. If you do not have a mask, a mask will be given to you at the Main Entrance upon arrival. For doctor visits, patients may have 1 support person age 63 or older with them. For treatment visits, patients can not have anyone with them due to social distancing guidelines and our immunocompromised population.

## 2022-06-18 ENCOUNTER — Other Ambulatory Visit: Payer: No Typology Code available for payment source

## 2022-06-21 ENCOUNTER — Telehealth: Payer: Self-pay | Admitting: Family Medicine

## 2022-06-21 NOTE — Telephone Encounter (Signed)
Patient called In regarded to high BP Patient sates that BP has been high since taking the Chemo pills   Bp is running 144/89 150/89  Dr. Zigmund Daniel suggest that if bp continues to run high ask provider if will up bp med dosage .  Patient wants to know if it is okay to take bp med 2 x daily   Wants a call back in regard

## 2022-06-21 NOTE — Telephone Encounter (Signed)
Scheduled for 06/26/22 patient aware

## 2022-06-25 ENCOUNTER — Encounter: Payer: Self-pay | Admitting: Family Medicine

## 2022-06-25 ENCOUNTER — Other Ambulatory Visit (HOSPITAL_COMMUNITY): Payer: Self-pay

## 2022-06-25 ENCOUNTER — Ambulatory Visit: Payer: No Typology Code available for payment source | Admitting: Family Medicine

## 2022-06-25 VITALS — BP 136/75 | HR 65 | Ht 66.0 in | Wt 174.1 lb

## 2022-06-25 DIAGNOSIS — F439 Reaction to severe stress, unspecified: Secondary | ICD-10-CM

## 2022-06-25 DIAGNOSIS — E1159 Type 2 diabetes mellitus with other circulatory complications: Secondary | ICD-10-CM | POA: Diagnosis not present

## 2022-06-25 DIAGNOSIS — E119 Type 2 diabetes mellitus without complications: Secondary | ICD-10-CM | POA: Diagnosis not present

## 2022-06-25 DIAGNOSIS — I10 Essential (primary) hypertension: Secondary | ICD-10-CM

## 2022-06-25 NOTE — Patient Instructions (Signed)
Annual exam in January, call if you need me before  Normal blood pressure  Muicroalb today  Good foot exam  It is important that you exercise regularly at least 30 minutes 5 times a week. If you develop chest pain, have severe difficulty breathing, or feel very tired, stop exercising immediately and seek medical attention   Check re covid vaccine with Oncology  Thanks for choosing [facility], we consider it a privelige to serve you.

## 2022-06-25 NOTE — Progress Notes (Signed)
Deanna Bush     MRN: [id number]      DOB: July 12, 1961   HPI [name] is here for follow up of blood pressure, she had called in with reports of blood pressure being high At visit within treatment range and not uncontrolled Great report of  cancer responding to treatment Personal marital stress x 7 months which she is handling as best she can, focusing on her health ROS Denies recent fever or chills. Denies sinus pressure, nasal congestion, ear pain or sore throat. Denies chest congestion, productive cough or wheezing. Denies chest pains, palpitations and leg swelling Denies abdominal pain, nausea, vomiting,diarrhea or constipation.   Denies dysuria, frequency, hesitancy or incontinence. Denies joint pain, swelling and limitation in mobility. Denies headaches, seizures, numbness, or tingling. Denies depression, anxiety or insomnia. Denies skin break down or rash.   PE  BP 136/75 (BP Location: Right Arm, Patient Position: Sitting, Cuff Size: Large)   Pulse 65   Ht '5\' 6"'$  (1.676 m)   Wt 174 lb 1.9 oz (79 kg)   SpO2 97%   BMI 28.10 kg/m   Patient alert and oriented and in no cardiopulmonary distress.  HEENT: No facial asymmetry, EOMI,     Neck supple .  Chest: Clear to auscultation bilaterally.  CVS: S1, S2 no murmurs, no S3.Regular rate.  ABD: Soft non tender.   Ext: No edema  MS: Adequate ROM spine, shoulders, hips and knees.  Skin: Intact, no ulcerations or rash noted.  Psych: Good eye contact, normal affect. Memory intact not anxious or depressed appearing.  CNS: CN 2-12 intact, power,  normal throughout.no focal deficits noted.   Assessment & Plan  Primary hypertension Controlled, no change in medication DASH diet and commitment to daily physical activity for a minimum of 30 minutes discussed and encouraged, as a part of hypertension management. The importance of attaining a healthy weight is also discussed.     06/25/2022    9:27 AM 06/12/2022   10:24 AM  05/02/2022    2:51 PM 05/01/2022    3:59 PM 04/17/2022    8:09 AM 04/02/2022    9:18 AM 03/18/2022   10:13 AM  BP/Weight  Systolic BP 762 831 517 616 073 710 626  Diastolic BP 75 79 77 67 70 68 67  Wt. (Lbs) 174.12 176.9 178.4  178.04 180.4 184  BMI 28.1 kg/m2 28.43 kg/m2 28.67 kg/m2  28.74 kg/m2 29.12 kg/m2 29.57 kg/m2       Type 2 diabetes, HbA1c goal < 7% ([place]) [name] is reminded of the importance of commitment to daily physical activity for 30 minutes or more, as able and the need to limit carbohydrate intake to 30 to 60 grams per meal to help with blood sugar control.   [name] is reminded of the importance of daily foot exam, annual eye examination, and good blood sugar, blood pressure and cholesterol control. Remains diet controlled     Latest Ref Rng & Units 06/12/2022   10:57 AM 05/02/2022    2:53 PM 04/02/2022    9:18 AM 03/18/2022   10:11 AM 03/12/2022    8:55 AM  Diabetic Labs  HbA1c 0.0 - 7.0 %     6.6   Creatinine 0.44 - 1.00 mg/dL 0.94  1.13  0.98  0.97        06/25/2022    9:27 AM 06/12/2022   10:24 AM 05/02/2022    2:51 PM 05/01/2022    3:59 PM 04/17/2022  8:09 AM 04/02/2022    9:18 AM 03/18/2022   10:13 AM  BP/Weight  Systolic BP 921 194 174 081 448 185 631  Diastolic BP 75 79 77 67 70 68 67  Wt. (Lbs) 174.12 176.9 178.4  178.04 180.4 184  BMI 28.1 kg/m2 28.43 kg/m2 28.67 kg/m2  28.74 kg/m2 29.12 kg/m2 29.57 kg/m2      04/18/2021    8:00 AM 10/29/2018    2:40 PM  Foot/eye exam completion dates  Foot Form Completion Done Done        Stress at home Managing situation as best as she can, no ionterest in therapy at this time

## 2022-06-25 NOTE — Assessment & Plan Note (Signed)
Managing situation as best as she can, no ionterest in therapy at this time

## 2022-06-25 NOTE — Assessment & Plan Note (Signed)
Deanna Bush is reminded of the importance of commitment to daily physical activity for 30 minutes or more, as able and the need to limit carbohydrate intake to 30 to 60 grams per meal to help with blood sugar control.   Deanna Bush is reminded of the importance of daily foot exam, annual eye examination, and good blood sugar, blood pressure and cholesterol control. Remains diet controlled     Latest Ref Rng & Units 06/12/2022   10:57 AM 05/02/2022    2:53 PM 04/02/2022    9:18 AM 03/18/2022   10:11 AM 03/12/2022    8:55 AM  Diabetic Labs  HbA1c 0.0 - 7.0 %     6.6   Creatinine 0.44 - 1.00 mg/dL 0.94  1.13  0.98  0.97        06/25/2022    9:27 AM 06/12/2022   10:24 AM 05/02/2022    2:51 PM 05/01/2022    3:59 PM 04/17/2022    8:09 AM 04/02/2022    9:18 AM 03/18/2022   10:13 AM  BP/Weight  Systolic BP 459 977 414 239 532 023 343  Diastolic BP 75 79 77 67 70 68 67  Wt. (Lbs) 174.12 176.9 178.4  178.04 180.4 184  BMI 28.1 kg/m2 28.43 kg/m2 28.67 kg/m2  28.74 kg/m2 29.12 kg/m2 29.57 kg/m2      04/18/2021    8:00 AM 10/29/2018    2:40 PM  Foot/eye exam completion dates  Foot Form Completion Done Done

## 2022-06-25 NOTE — Assessment & Plan Note (Signed)
Controlled, no change in medication DASH diet and commitment to daily physical activity for a minimum of 30 minutes discussed and encouraged, as a part of hypertension management. The importance of attaining a healthy weight is also discussed.     06/25/2022    9:27 AM 06/12/2022   10:24 AM 05/02/2022    2:51 PM 05/01/2022    3:59 PM 04/17/2022    8:09 AM 04/02/2022    9:18 AM 03/18/2022   10:13 AM  BP/Weight  Systolic BP 458 483 507 573 225 672 091  Diastolic BP 75 79 77 67 70 68 67  Wt. (Lbs) 174.12 176.9 178.4  178.04 180.4 184  BMI 28.1 kg/m2 28.43 kg/m2 28.67 kg/m2  28.74 kg/m2 29.12 kg/m2 29.57 kg/m2

## 2022-06-27 LAB — MICROALBUMIN / CREATININE URINE RATIO
Creatinine, Urine: 29 mg/dL
Microalb/Creat Ratio: 85 mg/g creat — ABNORMAL HIGH (ref 0–29)
Microalbumin, Urine: 24.6 ug/mL

## 2022-06-28 LAB — TSH: TSH: 24.4 u[IU]/mL — ABNORMAL HIGH (ref 0.450–4.500)

## 2022-06-28 LAB — T4, FREE: Free T4: 1.01 ng/dL (ref 0.82–1.77)

## 2022-07-12 ENCOUNTER — Ambulatory Visit: Payer: No Typology Code available for payment source | Admitting: "Endocrinology

## 2022-07-23 ENCOUNTER — Ambulatory Visit (INDEPENDENT_AMBULATORY_CARE_PROVIDER_SITE_OTHER): Payer: No Typology Code available for payment source | Admitting: "Endocrinology

## 2022-07-23 ENCOUNTER — Encounter: Payer: Self-pay | Admitting: "Endocrinology

## 2022-07-23 VITALS — BP 138/76 | HR 64 | Ht 66.0 in | Wt 173.4 lb

## 2022-07-23 DIAGNOSIS — E782 Mixed hyperlipidemia: Secondary | ICD-10-CM

## 2022-07-23 DIAGNOSIS — E119 Type 2 diabetes mellitus without complications: Secondary | ICD-10-CM | POA: Diagnosis not present

## 2022-07-23 DIAGNOSIS — E890 Postprocedural hypothyroidism: Secondary | ICD-10-CM

## 2022-07-23 LAB — POCT GLYCOSYLATED HEMOGLOBIN (HGB A1C): HbA1c, POC (controlled diabetic range): 6.3 % (ref 0.0–7.0)

## 2022-07-23 MED ORDER — LEVOTHYROXINE SODIUM 112 MCG PO TABS: 112.0000 ug | ORAL_TABLET | Freq: Every day | ORAL | 1 refills | Status: DC

## 2022-07-23 NOTE — Patient Instructions (Signed)

## 2022-07-23 NOTE — Progress Notes (Signed)
07/23/2022, 12:19 PM  Endocrinology follow-up note   Deanna Bush is a 61 y.o.-year-old female patient being seen for follow-up after she was seen in consultation for hypothyroidism. PMD:  Fayrene Helper, MD.   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    Cellulitis of leg, left    Complication of anesthesia    Hard to wake up   Diabetes mellitus without complication ([place])    Pt denies   Family history of breast cancer 08/16/2011   Family history of breast cancer    Family history of colon cancer    Family history of kidney cancer    Family history of prostate cancer    Hyperlipidemia    Hypertension    Hypothyroidism    MRSA (methicillin resistant staph aureus) culture positive 08/19/2011   Pre-diabetes     Past Surgical History:  Procedure Laterality Date   BREAST SURGERY Left    mastectomy   CESAREAN SECTION     x2   COLONOSCOPY N/A 03/03/2020   Procedure: COLONOSCOPY;  Surgeon: Daneil Dolin, MD;  Location: [facility];  Service: Endoscopy;  Laterality: N/A;  12:00   IR IMAGING GUIDED PORT INSERTION  11/07/2021   left mastectomy     NEPHRECTOMY Right 02/06/2021   Procedure: NEPHRECTOMY- open radical;  Surgeon: Cleon Gustin, MD;  Location: [facility];  Service: Urology;  Laterality: Right;   POLYPECTOMY  03/03/2020   Procedure: POLYPECTOMY;  Surgeon: Daneil Dolin, MD;  Location: [facility];  Service: Endoscopy;;    Social History   Socioeconomic History   Marital status: Married    Spouse name: Not on file   Number of children: 2   Years of education: Not on file   Highest education level: Not on file  Occupational History   Occupation: CNA  Tobacco Use   Smoking status: Former    Packs/day: 0.50    Years: 18.00    Total pack years: 9.00    Types: Cigarettes    Quit date: 07/01/2002    Years since quitting: 20.0   Smokeless  tobacco: Never  Vaping Use   Vaping Use: Never used  Substance and Sexual Activity   Alcohol use: No   Drug use: No   Sexual activity: Not Currently  Other Topics Concern   Not on file  Social History Narrative   Not on file   Social Determinants of Health   Financial Resource Strain: Not on file  Food Insecurity: Not on file  Transportation Needs: Not on file  Physical Activity: Not on file  Stress: Not on file  Social Connections: Not on file    Family History  Problem Relation Age of Onset   Hypertension Mother    Diabetes Mother    Hyperlipidemia Mother    Colon cancer Maternal Aunt        dx 71s   Prostate cancer Maternal Uncle    Throat cancer Maternal Uncle    Kidney cancer Maternal Grandfather  Lung cancer Half-Sister    Kidney cancer Half-Sister 34   Breast cancer Half-Sister 32   Breast cancer Half-Sister 28    Outpatient Encounter Medications as of 07/23/2022  Medication Sig   acetaminophen (TYLENOL) 500 MG tablet Take 500 mg by mouth every 6 (six) hours as needed for moderate pain or headache.   amLODipine (NORVASC) 5 MG tablet TAKE 1 TABLET BY MOUTH DAILY.   atorvastatin (LIPITOR) 10 MG tablet TAKE ONE TABLET BY MOUTH ONCE DAILY   EPINEPHrine 0.3 mg/0.3 mL IJ SOAJ injection Inject 0.3 mg into the muscle as needed for anaphylaxis.   everolimus (AFINITOR) 5 MG tablet Take 1 tablet (5 mg total) by mouth daily.   Iron, Ferrous Sulfate, 325 (65 Fe) MG TABS Take 325 mg by mouth daily.   lenvatinib 18 mg daily dose (LENVIMA) 10 MG & 2 x 4 MG capsule Take 18 mg by mouth daily.   levothyroxine (SYNTHROID) 112 MCG tablet Take 1 tablet (112 mcg total) by mouth daily before breakfast.   Multiple Vitamin (MULTIVITAMIN WITH MINERALS) TABS tablet Take 1 tablet by mouth daily.   prochlorperazine (COMPAZINE) 10 MG tablet TAKE 1 TABLET (10 MG TOTAL) BY MOUTH EVERY 6 (SIX) HOURS AS NEEDED FOR NAUSEA OR VOMITING.   UNABLE TO FIND MASTECTOMY BRA AND PROTHESIS DX Z90.12    UNABLE TO FIND 6 mastectomy prosthesis and bras.   urea (CARMOL) 10 % cream Apply topically 2 (two) times daily. Apply twice daily to hands   [DISCONTINUED] levothyroxine (SYNTHROID) 100 MCG tablet Take 1 tablet (100 mcg total) by mouth daily before breakfast.   Facility-Administered Encounter Medications as of 07/23/2022  Medication   heparin lock flush 100 unit/mL   sodium chloride flush (NS) 0.9 % injection 10 mL    ALLERGIES: Allergies  Allergen Reactions   Sulfonamide Derivatives Itching   Yellow Jacket Venom [Bee Venom] Rash    urticaria   VACCINATION STATUS: Immunization History  Administered Date(s) Administered   Influenza Split 08/06/2012   Influenza,inj,Quad PF,6+ Mos 07/22/2018, 06/28/2019, 06/28/2020, 07/10/2021   Moderna Sars-Covid-2 Vaccination 12/07/2019, 01/07/2020, 08/09/2020, 04/05/2021   Pneumococcal Conjugate-13 03/29/2015   Pneumococcal Polysaccharide-23 04/18/2021   Tdap 03/12/2011, 07/12/2021   Zoster Recombinat (Shingrix) 05/05/2018, 07/02/2018     HPI    Deanna Bush  is a patient with the above medical history.  Her history starts at approximate age of 54 when she had Graves' disease which required RAI thyroid ablation.  She is currently on levothyroxine 88 mcg p.o. daily before breakfast.      She reports compliance to his medication.  She has no new complaints today.  Since her last visit, she underwent right nephrectomy for renal cell carcinoma.  Her previsit thyroid function tests are consistent with appropriate replacement.     She denies fatigue, palpitations, tremors, heat/cold intolerance. -She reports fluctuating body weight. -She denies dysphagia, shortness of breath, no voice change.  She underwent thyroid ultrasound recently which did not show significant goiter , nor any discrete thyroid nodules.    she has family history of  thyroid disorders in her mother, but no family history of thyroid cancer.  -She exercises regularly,  non-smoker.  Her medical history also includes hyperlipidemia for which she is taking atorvastatin 10 mg p.o. nightly. -She has history of diet/exercise controlled type 2 diabetes with recent A1c of 6.6%.     She also has hyperlipidemia on management with PMD.   ROS:  Limited as above.   Physical Exam: BP  138/76   Pulse 64   Ht '5\' 6"'$  (1.676 m)   Wt 173 lb 6.4 oz (78.7 kg)   BMI 27.99 kg/m  Wt Readings from Last 3 Encounters:  07/23/22 173 lb 6.4 oz (78.7 kg)  06/25/22 174 lb 1.9 oz (79 kg)  06/12/22 176 lb 14.4 oz (80.2 kg)    Constitutional:  Body mass index is 27.99 kg/m., not in acute distress, normal state of mind   CMP ( most recent) CMP     Component Value Date/Time   NA 136 06/12/2022 1057   NA 138 07/10/2021 1530   K 3.7 06/12/2022 1057   CL 105 06/12/2022 1057   CO2 24 06/12/2022 1057   GLUCOSE 107 (H) 06/12/2022 1057   BUN 15 06/12/2022 1057   BUN 18 07/10/2021 1530   CREATININE 0.94 06/12/2022 1057   CREATININE 0.73 04/22/2019 0848   CALCIUM 8.8 (L) 06/12/2022 1057   PROT 7.5 06/12/2022 1057   PROT 7.4 07/10/2021 1530   ALBUMIN 3.5 06/12/2022 1057   ALBUMIN 4.4 07/10/2021 1530   AST 20 06/12/2022 1057   ALT 15 06/12/2022 1057   ALKPHOS 96 06/12/2022 1057   BILITOT 0.6 06/12/2022 1057   BILITOT 0.3 07/10/2021 1530   GFRNONAA >60 06/12/2022 1057   GFRNONAA 90 04/22/2019 0848   GFRAA 96 10/30/2020 0914   GFRAA 104 04/22/2019 0848     Diabetic Labs (most recent): Lab Results  Component Value Date   HGBA1C 6.3 07/23/2022   HGBA1C 6.6 03/12/2022   HGBA1C 6.7 (H) 07/10/2021   MICROALBUR 0.2 10/23/2018   MICROALBUR <0.2 06/17/2017   MICROALBUR <3.0 (H) 04/03/2016     Lipid Panel ( most recent) Lipid Panel     Component Value Date/Time   CHOL 175 07/10/2021 1530   TRIG 76 07/10/2021 1530   HDL 60 07/10/2021 1530   CHOLHDL 2.9 07/10/2021 1530   CHOLHDL 2.7 10/23/2018 1148   VLDL 7 08/12/2016 0913   LDLCALC 101 (H) 07/10/2021 1530    LDLCALC 100 (H) 10/23/2018 1148   LABVLDL 14 07/10/2021 1530       Lab Results  Component Value Date   TSH 24.400 (H) 06/27/2022   TSH 10.365 (H) 03/18/2022   TSH 6.639 (H) 02/21/2022   TSH 3.486 01/24/2022   TSH 3.706 01/03/2022   TSH 2.455 12/13/2021   TSH 1.281 11/19/2021   TSH 0.980 08/28/2021   TSH 0.657 10/30/2020   TSH 1.060 09/04/2020   FREET4 1.01 06/27/2022   FREET4 1.25 02/28/2022   FREET4 1.44 08/28/2021   FREET4 1.30 09/04/2020   FREET4 1.1 03/28/2020   FREET4 0.7 (L) 01/03/2020       ASSESSMENT: 1. Hypothyroidism   2.  Type 2 diabetes  3.  Hyperlipidemia  PLAN:  -Her previsit thyroid function tests are consistent with slight under replacement.  I discussed and increased her levothyroxine to 112 mcg p.o. daily.     - We discussed about the correct intake of her thyroid hormone, on empty stomach at fasting, with water, separated by at least 30 minutes from breakfast and other medications,  and separated by more than 4 hours from calcium, iron, multivitamins, acid reflux medications (PPIs). -Patient is made aware of the fact that thyroid hormone replacement is needed for life, dose to be adjusted by periodic monitoring of thyroid function tests.    -Her recent thyroid ultrasound was reviewed, no goiter, no discrete nodules. -She has controlled type 2 diabetes with point-of-care A1c today at 6.3%.  She is not on medications.  She promises to continue to do better on her diet and engaging with lifestyle medicine.  She will have repeat A1c during her next visit.  She is advised to continue her atorvastatin and milligrams p.o. nightly. She is advised to maintain close follow-up with her PMD Dr. Tula Nakayama.  For her hyperlipidemia, she is on atorvastatin 10 mg p.o. nightly.   She will be considered for repeat fasting lipid panel on subsequent visits.  I spent 30 minutes in the care of the patient today including review of labs from Thyroid Function, CMP, and  other relevant labs ; imaging/biopsy records (current and previous including abstractions from other facilities); face-to-face time discussing  her lab results and symptoms, medications doses, her options of short and long term treatment based on the latest standards of care / guidelines;   and documenting the encounter.  [name]  participated in the discussions, expressed understanding, and voiced agreement with the above plans.  All questions were answered to her satisfaction. she is encouraged to contact clinic should she have any questions or concerns prior to her return visit.     Return in about 6 months (around 01/22/2023) for F/U with Pre-visit Labs, A1c -NV.  Glade Lloyd, MD [facility] [address] [place], [place] [address] Phone: [phone]  Fax: [phone]   07/23/2022, 12:19 PM  This note was partially dictated with voice recognition software. Similar sounding words can be transcribed inadequately or may not  be corrected upon review.

## 2022-08-05 ENCOUNTER — Other Ambulatory Visit (HOSPITAL_COMMUNITY): Payer: Self-pay

## 2022-08-05 ENCOUNTER — Other Ambulatory Visit: Payer: Self-pay | Admitting: *Deleted

## 2022-08-05 MED ORDER — EVEROLIMUS 5 MG PO TABS: 5.0000 mg | ORAL_TABLET | Freq: Every day | ORAL | 3 refills | Status: DC

## 2022-08-07 ENCOUNTER — Inpatient Hospital Stay: Payer: No Typology Code available for payment source | Attending: Hematology | Admitting: Hematology

## 2022-08-07 ENCOUNTER — Inpatient Hospital Stay: Payer: No Typology Code available for payment source

## 2022-08-07 DIAGNOSIS — C641 Malignant neoplasm of right kidney, except renal pelvis: Secondary | ICD-10-CM

## 2022-08-07 DIAGNOSIS — Z9012 Acquired absence of left breast and nipple: Secondary | ICD-10-CM | POA: Diagnosis not present

## 2022-08-07 DIAGNOSIS — Z803 Family history of malignant neoplasm of breast: Secondary | ICD-10-CM | POA: Insufficient documentation

## 2022-08-07 DIAGNOSIS — D649 Anemia, unspecified: Secondary | ICD-10-CM

## 2022-08-07 DIAGNOSIS — Z800 Family history of malignant neoplasm of digestive organs: Secondary | ICD-10-CM | POA: Diagnosis not present

## 2022-08-07 DIAGNOSIS — Z801 Family history of malignant neoplasm of trachea, bronchus and lung: Secondary | ICD-10-CM | POA: Diagnosis not present

## 2022-08-07 DIAGNOSIS — I10 Essential (primary) hypertension: Secondary | ICD-10-CM | POA: Insufficient documentation

## 2022-08-07 DIAGNOSIS — E039 Hypothyroidism, unspecified: Secondary | ICD-10-CM | POA: Insufficient documentation

## 2022-08-07 DIAGNOSIS — Z905 Acquired absence of kidney: Secondary | ICD-10-CM | POA: Insufficient documentation

## 2022-08-07 DIAGNOSIS — Z8051 Family history of malignant neoplasm of kidney: Secondary | ICD-10-CM | POA: Insufficient documentation

## 2022-08-07 DIAGNOSIS — Z8042 Family history of malignant neoplasm of prostate: Secondary | ICD-10-CM | POA: Diagnosis not present

## 2022-08-07 DIAGNOSIS — C787 Secondary malignant neoplasm of liver and intrahepatic bile duct: Secondary | ICD-10-CM | POA: Insufficient documentation

## 2022-08-07 DIAGNOSIS — E119 Type 2 diabetes mellitus without complications: Secondary | ICD-10-CM | POA: Diagnosis not present

## 2022-08-07 DIAGNOSIS — Z853 Personal history of malignant neoplasm of breast: Secondary | ICD-10-CM | POA: Insufficient documentation

## 2022-08-07 DIAGNOSIS — Z87891 Personal history of nicotine dependence: Secondary | ICD-10-CM | POA: Diagnosis not present

## 2022-08-07 LAB — COMPREHENSIVE METABOLIC PANEL
ALT: 19 U/L (ref 0–44)
AST: 26 U/L (ref 15–41)
Albumin: 3.7 g/dL (ref 3.5–5.0)
Alkaline Phosphatase: 98 U/L (ref 38–126)
Anion gap: 7 (ref 5–15)
BUN: 12 mg/dL (ref 8–23)
CO2: 25 mmol/L (ref 22–32)
Calcium: 9 mg/dL (ref 8.9–10.3)
Chloride: 104 mmol/L (ref 98–111)
Creatinine, Ser: 1.13 mg/dL — ABNORMAL HIGH (ref 0.44–1.00)
GFR, Estimated: 55 mL/min — ABNORMAL LOW (ref >60)
Glucose, Bld: 93 mg/dL (ref 70–99)
Potassium: 3.4 mmol/L — ABNORMAL LOW (ref 3.5–5.1)
Sodium: 136 mmol/L (ref 135–145)
Total Bilirubin: 0.4 mg/dL (ref 0.3–1.2)
Total Protein: 7.4 g/dL (ref 6.5–8.1)

## 2022-08-07 LAB — CBC WITH DIFFERENTIAL/PLATELET
Abs Immature Granulocytes: 0.01 10*3/uL (ref 0.00–0.07)
Basophils Absolute: 0 10*3/uL (ref 0.0–0.1)
Basophils Relative: 0 %
Eosinophils Absolute: 0 10*3/uL (ref 0.0–0.5)
Eosinophils Relative: 1 %
HCT: 30.7 % — ABNORMAL LOW (ref 36.0–46.0)
Hemoglobin: 9.9 g/dL — ABNORMAL LOW (ref 12.0–15.0)
Immature Granulocytes: 0 %
Lymphocytes Relative: 33 %
Lymphs Abs: 1.5 10*3/uL (ref 0.7–4.0)
MCH: 27.4 pg (ref 26.0–34.0)
MCHC: 32.2 g/dL (ref 30.0–36.0)
MCV: 85 fL (ref 80.0–100.0)
Monocytes Absolute: 0.3 10*3/uL (ref 0.1–1.0)
Monocytes Relative: 6 %
Neutro Abs: 2.8 10*3/uL (ref 1.7–7.7)
Neutrophils Relative %: 60 %
Platelets: 272 10*3/uL (ref 150–400)
RBC: 3.61 MIL/uL — ABNORMAL LOW (ref 3.87–5.11)
RDW: 15.9 % — ABNORMAL HIGH (ref 11.5–15.5)
WBC: 4.6 10*3/uL (ref 4.0–10.5)
nRBC: 0 % (ref 0.0–0.2)

## 2022-08-07 LAB — MAGNESIUM: Magnesium: 2 mg/dL (ref 1.7–2.4)

## 2022-08-07 MED ORDER — SODIUM CHLORIDE 0.9% FLUSH
10.0000 mL | Freq: Once | INTRAVENOUS | Status: AC
  Administered 2022-08-07: 10 mL via INTRAVENOUS

## 2022-08-07 MED ORDER — HEPARIN SOD (PORK) LOCK FLUSH 100 UNIT/ML IV SOLN
500.0000 [IU] | Freq: Once | INTRAVENOUS | Status: AC
  Administered 2022-08-07: 500 [IU] via INTRAVENOUS

## 2022-08-07 MED ORDER — EVEROLIMUS 5 MG PO TABS: 5.0000 mg | ORAL_TABLET | Freq: Every day | ORAL | 3 refills | Status: DC

## 2022-08-07 NOTE — Patient Instructions (Addendum)
[facility]  Discharge Instructions  You were seen and examined today by Dr. Delton Coombes.  Dr. Delton Coombes discussed your most recent lab work which revealed everything is looking stable.  Follow-up as scheduled in with labs and scan prior.    Thank you for choosing [facility] to provide your oncology and hematology care.   To afford each patient quality time with our provider, please arrive at least 15 minutes before your scheduled appointment time. You may need to reschedule your appointment if you arrive late (10 or more minutes). Arriving late affects you and other patients whose appointments are after yours.  Also, if you miss three or more appointments without notifying the office, you may be dismissed from the clinic at the provider's discretion.    Again, thank you for choosing [facility].  Our hope is that these requests will decrease the amount of time that you wait before being seen by our physicians.   If you have a lab appointment with the [facility] please come in thru the Main Entrance and check in at the main information desk.           _____________________________________________________________  Should you have questions after your visit to [facility], please contact our office at [phone] and follow the prompts.  Our office hours are 8:00 a.m. to 4:30 p.m. Monday - Thursday and 8:00 a.m. to 2:30 p.m. Friday.  Please note that voicemails left after 4:00 p.m. may not be returned until the following business day.  We are closed weekends and all major holidays.  You do have access to a nurse 24-7, just call the main number to the clinic [phone] and do not press any options, hold on the line and a nurse will answer the phone.    For prescription refill requests, have your pharmacy contact our office and allow 72 hours.    Masks are optional in the cancer centers. If you would like  for your care team to wear a mask while they are taking care of you, please let them know. You may have one support person who is at least 63 years old accompany you for your appointments.

## 2022-08-07 NOTE — Patient Instructions (Signed)
[facility]  Discharge Instructions: Thank you for choosing [facility] to provide your oncology and hematology care.  If you have a lab appointment with the [facility], please come in thru the Main Entrance and check in at the main information desk.  Wear comfortable clothing and clothing appropriate for easy access to any Portacath or PICC line.   We strive to give you quality time with your provider. You may need to reschedule your appointment if you arrive late (15 or more minutes).  Arriving late affects you and other patients whose appointments are after yours.  Also, if you miss three or more appointments without notifying the office, you may be dismissed from the clinic at the provider's discretion.      For prescription refill requests, have your pharmacy contact our office and allow 72 hours for refills to be completed.    Today you received the following port flush and lab draw, return as scheduled.   To help prevent nausea and vomiting after your treatment, we encourage you to take your nausea medication as directed.  BELOW ARE SYMPTOMS THAT SHOULD BE REPORTED IMMEDIATELY: *FEVER GREATER THAN 100.4 F (38 C) OR HIGHER *CHILLS OR SWEATING *NAUSEA AND VOMITING THAT IS NOT CONTROLLED WITH YOUR NAUSEA MEDICATION *UNUSUAL SHORTNESS OF BREATH *UNUSUAL BRUISING OR BLEEDING *URINARY PROBLEMS (pain or burning when urinating, or frequent urination) *BOWEL PROBLEMS (unusual diarrhea, constipation, pain near the anus) TENDERNESS IN MOUTH AND THROAT WITH OR WITHOUT PRESENCE OF ULCERS (sore throat, sores in mouth, or a toothache) UNUSUAL RASH, SWELLING OR PAIN  UNUSUAL VAGINAL DISCHARGE OR ITCHING   Items with * indicate a potential emergency and should be followed up as soon as possible or go to the Emergency Department if any problems should occur.  Please show the CHEMOTHERAPY ALERT CARD or IMMUNOTHERAPY ALERT CARD at check-in to the Emergency  Department and triage nurse.  Should you have questions after your visit or need to cancel or reschedule your appointment, please contact [facility] [phone]  and follow the prompts.  Office hours are 8:00 a.m. to 4:30 p.m. Monday - Friday. Please note that voicemails left after 4:00 p.m. may not be returned until the following business day.  We are closed weekends and major holidays. You have access to a nurse at all times for urgent questions. Please call the main number to the clinic [phone] and follow the prompts.  For any non-urgent questions, you may also contact your provider using MyChart. We now offer e-Visits for anyone 50 and older to request care online for non-urgent symptoms. For details visit [email or web address].   Also download the MyChart app! Go to the app store, search "MyChart", open the app, select [facility], and log in with your MyChart username and password.  Masks are optional in the cancer centers. If you would like for your care team to wear a mask while they are taking care of you, please let them know. You may have one support person who is at least 63 years old accompany you for your appointments.

## 2022-08-07 NOTE — Progress Notes (Signed)
Port flushed with good blood return noted, labs drawn. No bruising or swelling at site. Bandaid applied and patient discharged in satisfactory condition. VVS stable with no signs or symptoms of distressed noted. 

## 2022-08-07 NOTE — Progress Notes (Signed)
 Deanna Bush [address] 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  TMB was low.  CCND3 amplified.  PBRM1 VUS present.  CURRENT THERAPY: Lenvatinib and everolimus  BRIEF ONCOLOGIC HISTORY:  Oncology History  Renal cell cancer (HCC)  06/20/2021 Initial Diagnosis   Renal cell carcinoma of right kidney (HCC)   11/19/2021 - 01/24/2022 Chemotherapy   Patient is on Treatment Plan : RENAL CELL CARCINOMA Nivolumab + Ipilimumab q21d / Nivolumab q28d      Genetic Testing   Negative genetic testing. No pathogenic variants identified on the [facility] Multi-Cancer Panel + RNA. VUS in PMS2 called c.1732C>A and in RECQL4 called c.2967G>A  Identified. The report date is 11/01/2021.   The Multi-Cancer Panel + RNA offered by [facility] includes sequencing and/or deletion duplication testing of the following 84 genes: AIP, ALK, APC, ATM, AXIN2,BAP1,  BARD1, BLM, BMPR1A, BRCA1, BRCA2, BRIP1, CASR, CDC73, CDH1, CDK4, CDKN1B, CDKN1C, CDKN2A (p14ARF), CDKN2A (p16INK4a), CEBPA, CHEK2, CTNNA1, DICER1, DIS3L2, EGFR (c.2369C>T, p.Thr790Met variant only), EPCAM (Deletion/duplication testing only), FH, FLCN, GATA2, GPC3, GREM1 (Promoter region deletion/duplication testing only), HOXB13 (c.251G>A, p.Gly84Glu), HRAS, KIT, MAX, MEN1, MET, MITF (c.952G>A, p.Glu318Lys variant only), MLH1, MSH2, MSH3, MSH6, MUTYH, NBN, NF1, NF2, NTHL1, PALB2, PDGFRA, PHOX2B, PMS2, POLD1, POLE, POT1, PRKAR1A, PTCH1, PTEN, RAD50, RAD51C, RAD51D, RB1, RECQL4, RET, RUNX1, SDHAF2, SDHA (sequence changes only), SDHB, SDHC, SDHD, SMAD4, SMARCA4, SMARCB1, SMARCE1, STK11, SUFU, TERC, TERT, TMEM127, TP53, TSC1, TSC2, VHL,  WRN and WT1.      CANCER STAGING:  Cancer Staging  Malignant neoplasm of female breast (HCC) Staging form: Breast, AJCC 6th Edition - Clinical: Stage I (T1b, N0, M0) - Signed by Kefalas, Thomas S, PA on 08/16/2011  Renal cell cancer (HCC) Staging form: Kidney, AJCC 8th Edition - Clinical stage from 09/26/2021: Stage IV (cT3c, cNX, cM1) - Unsigned   INTERVAL HISTORY:  Deanna Bush, a 61 y.o. female, seen for follow-up of metastatic clear-cell renal cell carcinoma.  She is tolerating increased dose of lenvatinib 18 mg daily reasonably well.  She had some nausea for few days and also had increased skin peeling of the palms and soles which has stabilized.  Denies any major fatigue.  He is continuing to work full-time job.  She does not report any right-sided abdominal or back pain.  REVIEW OF SYSTEMS:  Review of Systems  Constitutional:  Negative for appetite change and fatigue.  HENT:   Negative for mouth sores.   Gastrointestinal:  Negative for nausea and vomiting.  Genitourinary:  Negative for dysuria.   Musculoskeletal:  Negative for back pain.  All other systems reviewed and are negative.   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HCC)    left    Cellulitis of leg, left    Complication of anesthesia    Hard to wake up   Diabetes mellitus without complication (HCC)    Pt denies   Family history of breast cancer 08/16/2011   Family history of breast cancer    Family history of colon cancer    Family history of kidney cancer    Family history of prostate cancer      Hyperlipidemia    Hypertension    Hypothyroidism    MRSA (methicillin resistant staph aureus) culture positive 08/19/2011   Pre-diabetes    Past Surgical History:  Procedure Laterality Date   BREAST SURGERY Left    mastectomy   CESAREAN SECTION     x2   COLONOSCOPY N/A 03/03/2020   Procedure: COLONOSCOPY;  Surgeon: Daneil Dolin, Deanna Bush;   Location: [facility];  Service: Endoscopy;  Laterality: N/A;  12:00   IR IMAGING GUIDED PORT INSERTION  11/07/2021   left mastectomy     NEPHRECTOMY Right 02/06/2021   Procedure: NEPHRECTOMY- open radical;  Surgeon: Cleon Gustin, Deanna Bush;  Location: [facility];  Service: Urology;  Laterality: Right;   POLYPECTOMY  03/03/2020   Procedure: POLYPECTOMY;  Surgeon: Daneil Dolin, Deanna Bush;  Location: [facility];  Service: Endoscopy;;    SOCIAL HISTORY:  Social History   Socioeconomic History   Marital status: Married    Spouse name: Not on file   Number of children: 2   Years of education: Not on file   Highest education level: Not on file  Occupational History   Occupation: CNA  Tobacco Use   Smoking status: Former    Packs/day: 0.50    Years: 18.00    Total pack years: 9.00    Types: Cigarettes    Quit date: 07/01/2002    Years since quitting: 20.1   Smokeless tobacco: Never  Vaping Use   Vaping Use: Never used  Substance and Sexual Activity   Alcohol use: No   Drug use: No   Sexual activity: Not Currently  Other Topics Concern   Not on file  Social History Narrative   Not on file   Social Determinants of Health   Financial Resource Strain: Not on file  Food Insecurity: Not on file  Transportation Needs: Not on file  Physical Activity: Not on file  Stress: Not on file  Social Connections: Not on file  Intimate Partner Violence: Not on file    FAMILY HISTORY:  Family History  Problem Relation Age of Onset   Hypertension Mother    Diabetes Mother    Hyperlipidemia Mother    Colon cancer Maternal Aunt        dx 56s   Prostate cancer Maternal Uncle    Throat cancer Maternal Uncle    Kidney cancer Maternal Grandfather    Lung cancer Half-Sister    Kidney cancer Half-Sister 52   Breast cancer Half-Sister 68   Breast cancer Half-Sister 23    CURRENT MEDICATIONS:  Current Outpatient Medications  Medication Sig Dispense Refill   acetaminophen (TYLENOL) 500 MG  tablet Take 500 mg by mouth every 6 (six) hours as needed for moderate pain or headache.     amLODipine (NORVASC) 5 MG tablet TAKE 1 TABLET BY MOUTH DAILY. 90 tablet 2   atorvastatin (LIPITOR) 10 MG tablet TAKE ONE TABLET BY MOUTH ONCE DAILY 90 tablet 3   EPINEPHrine 0.3 mg/0.3 mL IJ SOAJ injection Inject 0.3 mg into the muscle as needed for anaphylaxis. 2 each 0   Iron, Ferrous Sulfate, 325 (65 Fe) MG TABS Take 325 mg by mouth daily. 30 tablet 3   lenvatinib 18 mg daily dose (LENVIMA) 10 MG & 2 x 4 MG capsule Take 18 mg by mouth daily. 90 capsule 2   levothyroxine (SYNTHROID) 112 MCG tablet Take 1 tablet (112 mcg total) by mouth daily before breakfast. 90 tablet 1   Multiple Vitamin (MULTIVITAMIN WITH  MINERALS) TABS tablet Take 1 tablet by mouth daily.     prochlorperazine (COMPAZINE) 10 MG tablet TAKE 1 TABLET (10 MG TOTAL) BY MOUTH EVERY 6 (SIX) HOURS AS NEEDED FOR NAUSEA OR VOMITING. 210 tablet 2   UNABLE TO FIND MASTECTOMY BRA AND PROTHESIS DX Z90.12 6 each 2   UNABLE TO FIND 6 mastectomy prosthesis and bras. 6 each 0   urea (CARMOL) 10 % cream Apply topically 2 (two) times daily. Apply twice daily to hands 71 g 0   everolimus (AFINITOR) 5 MG tablet Take 1 tablet (5 mg total) by mouth daily. 30 tablet 3   No current facility-administered medications for this visit.   Facility-Administered Medications Ordered in Other Visits  Medication Dose Route Frequency Provider Last Rate Last Admin   heparin lock flush 100 unit/mL  500 Units Intravenous Once Katragadda, Sreedhar, Deanna Bush       sodium chloride flush (NS) 0.9 % injection 10 mL  10 mL Intravenous Once Katragadda, Sreedhar, Deanna Bush        ALLERGIES:  Allergies  Allergen Reactions   Sulfonamide Derivatives Itching   Yellow Jacket Venom [Bee Venom] Rash    urticaria    PHYSICAL EXAM:  Performance status (ECOG): 0 - Asymptomatic  There were no vitals filed for this visit.  Wt Readings from Last 3 Encounters:  08/07/22 171 lb 12.8 oz  (77.9 kg)  07/23/22 173 lb 6.4 oz (78.7 kg)  06/25/22 174 lb 1.9 oz (79 kg)   Physical Exam Vitals reviewed.  Constitutional:      Appearance: Normal appearance.  Cardiovascular:     Rate and Rhythm: Normal rate and regular rhythm.     Pulses: Normal pulses.     Heart sounds: Normal heart sounds.  Pulmonary:     Effort: Pulmonary effort is normal.     Breath sounds: Normal breath sounds.  Musculoskeletal:     Right lower leg: No edema.     Left lower leg: No edema.  Skin:    Findings: No erythema. Rash is not vesicular.     Comments: Skin peeling on both palms  Neurological:     General: No focal deficit present.     Mental Status: She is alert and oriented to person, place, and time.  Psychiatric:        Mood and Affect: Mood normal.        Behavior: Behavior normal.      LABORATORY DATA:  I have reviewed the labs as listed.     Latest Ref Rng & Units 08/07/2022    1:48 PM 06/12/2022   10:57 AM 05/02/2022    2:53 PM  CBC  WBC 4.0 - 10.5 K/uL 4.6  4.8  4.0   Hemoglobin 12.0 - 15.0 g/dL 9.9  10.0  9.4   Hematocrit 36.0 - 46.0 % 30.7  31.6  30.3   Platelets 150 - 400 K/uL 272  223  245       Latest Ref Rng & Units 08/07/2022    1:48 PM 06/12/2022   10:57 AM 05/02/2022    2:53 PM  CMP  Glucose 70 - 99 mg/dL 93  107  135   BUN 8 - 23 mg/dL 12  15  15   Creatinine 0.44 - 1.00 mg/dL 1.13  0.94  1.13   Sodium 135 - 145 mmol/L 136  136  137   Potassium 3.5 - 5.1 mmol/L 3.4  3.7  3.4   Chloride 98 - 111 mmol/L 104    105  108   CO2 22 - 32 mmol/L 25  24  25   Calcium 8.9 - 10.3 mg/dL 9.0  8.8  8.9   Total Protein 6.5 - 8.1 g/dL 7.4  7.5  7.1   Total Bilirubin 0.3 - 1.2 mg/dL 0.4  0.6  0.4   Alkaline Phos 38 - 126 U/L 98  96  92   AST 15 - 41 U/L 26  20  17   ALT 0 - 44 U/L 19  15  13     DIAGNOSTIC IMAGING:  I have independently reviewed the scans and discussed with the patient.    ASSESSMENT:  Metastatic clear-cell renal cell carcinoma: - Right radical nephrectomy  on 02/06/2021. - Pathology shows clear-cell RCC, grade 4, 12.5 cm, necrosis and rhabdoid features are identified.  Tumor extends into the and invades the wall of the vena cava (pT3c).  Vascular, ureteral and all margins of resection are negative for tumor. - CT scan abdomen with and without contrast on 05/29/2021 showed several nodules along the peritoneal surface of the right nephrectomy bed warranting close attention. - CTAP with and without contrast on 09/18/2021 showed progressive nodularity within the right nephrectomy bed, with enlarged nodules adjacent to the right hepatic lobe extending inferiorly along the right retroperitoneum with multiple enlarged enhancing nodules.  Direct metastatic invasion into the right hepatic lobe.  Small nodule at the right lung base favored to be benign. - IMDC: Intermediate risk group with 2 features including diagnosis less than 1 year and hemoglobin lower than normal. - Bone scan on 10/05/2021 with focal activity in the mental vertex of the mandible which could be inflammatory/traumatic/metastatic. - CT chest on 10/03/2021 showed several lung nodules scattered bilaterally some of which could be metastatic and many others could be due to mucoid impaction. - NGS testing did not show any targetable mutations.  TMB was low.  CCND3 amplified.  PBRM1 VUS present. - Right nephrectomy bed biopsy (10/22/2021): Clear-cell renal cell carcinoma with necrosis - 4 cycles of ipilimumab and nivolumab from 11/19/2021 through 01/24/2022 - CT CAP on 02/14/2022: Numerous new and enlarged lung nodules.  Significant interval increase in the mass in the right renal bed.  New hypodense liver lesions. - Lenvatinib (12 mg) and everolimus 5 mg daily started on 03/13/2022    Social/family history: - She lives at home with her husband and also takes care of her mother. 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  She quit smoking in 2004 and smoked half pack per day for 10 years. -  Half sister (same mother) had kidney cancer at age 72.  Maternal grandfather had kidney cancer.  Sister died of lung cancer.  2 half sisters (same father) had breast cancers.  3.  Left breast stage I tubular adenocarcinoma: - She underwent left mastectomy on 06/11/2002.  0/6 lymph nodes positive.  ER 90%, PR 92%, Ki-67 6%, HER2 negative.  As per chart, declined antiestrogen therapy.   PLAN:  Metastatic clear-cell RCC, intermediate risk by IMDC: -CT CAP (05/20/2022): Hypodensity nodularity throughout the right retroperitoneum, hepatorenal recess and right paracolic gutter improved.  Low-attenuation liver metastatic lesions are stable.  Few new low-attenuation liver lesions suspicious for metastatic disease but are all subcentimeter.  Nodules within the basal right upper lobe posterior right middle lobe and medial left lobe has resolved in the interval. - Lenvatinib dose increased to 18 mg daily towards the end of September.  She has completed 1 bottle of   increased dose of lenvatinib. - She has noticed some nausea and skin peeling of the palms and soles in the first few weeks which has subsided.  No severe fatigue reported. - Reviewed labs from 08/07/2022 which showed normal LFTs.  Creatinine is 1.13.  CBC was grossly normal with hemoglobin 9.9 and platelet count 272. - Continue lenvatinib 18 mg daily and everolimus 5 mg daily. - Recommend RTC 5 weeks for follow-up.  We will obtain CT CAP with contrast along with labs.  2.  Normocytic anemia: - Partly from myelosuppression.  Continue iron tablet daily. - Recommend checking ferritin, iron panel, B12 and folic acid at next visit.  3.  Hypothyroidism: - Recent TSH was 24. - Synthroid was increased to 112 mcg daily.    Orders placed this encounter:  Orders Placed This Encounter  Procedures   CBC with Differential/Platelet   Comprehensive metabolic panel   Magnesium   Ferritin   Iron and TIBC   Vitamin B12   Folate      Sreedhar  Katragadda, [facility] [phone]       

## 2022-08-09 ENCOUNTER — Ambulatory Visit: Payer: No Typology Code available for payment source | Admitting: Family Medicine

## 2022-08-13 ENCOUNTER — Other Ambulatory Visit (HOSPITAL_COMMUNITY): Payer: Self-pay

## 2022-08-14 ENCOUNTER — Telehealth: Payer: Self-pay

## 2022-08-14 NOTE — Telephone Encounter (Signed)
Oral Oncology Patient Advocate Encounter  Reached out and spoke with patient regarding PAP paperwork, explained that I would send it to their preferred email via DocuSign.   Confirmed email address: [facility].    Patient expressed understanding and consent.  Will follow up once paperwork has been signed and returned.   [name], [facility] Patient [place]  [phone] (phone) [phone] (fax) 08/14/2022 3:30 PM

## 2022-08-14 NOTE — Telephone Encounter (Signed)
Oral Oncology Patient Advocate Encounter   Received notification that patient is due for re-enrollment for assistance for Afinitor through [employer].   Re-enrollment process has been initiated and will be submitted upon completion of necessary documents.  [employer]' phone number [phone].   I will continue to follow until final determination.  [name], [facility] Patient [place]  [phone] (phone) [phone] (fax) 08/14/2022 3:29 PM

## 2022-08-19 NOTE — Telephone Encounter (Signed)
Received patient signatures and financial documents. Will submit after obtaining MD signature.  [name], [facility] Patient [place]  [phone] (phone) [phone] (fax) 08/19/2022 11:28 AM

## 2022-09-05 NOTE — Telephone Encounter (Signed)
Oral Oncology Patient Advocate Encounter   Submitted application for assistance for Afinitor to [employer].   Application submitted via e-fax to [facility] phone number [phone].   I will continue to check the status until final determination.   [name], [facility] Patient [place]  [phone] (phone) [phone] (fax) 09/05/2022 8:40 AM

## 2022-09-09 ENCOUNTER — Ambulatory Visit (HOSPITAL_COMMUNITY)
Admission: RE | Admit: 2022-09-09 | Discharge: 2022-09-09 | Disposition: A | Discharge status: Home / Self Care | Payer: No Typology Code available for payment source | Source: Ambulatory Visit | Attending: Hematology | Admitting: Hematology

## 2022-09-09 DIAGNOSIS — C641 Malignant neoplasm of right kidney, except renal pelvis: Secondary | ICD-10-CM | POA: Diagnosis present

## 2022-09-09 MED ORDER — HEPARIN SOD (PORK) LOCK FLUSH 100 UNIT/ML IV SOLN
INTRAVENOUS | Status: AC
  Administered 2022-09-09: 500 [IU]
  Filled 2022-09-09: qty 5

## 2022-09-09 MED ORDER — IOHEXOL 300 MG/ML  SOLN
100.0000 mL | Freq: Once | INTRAMUSCULAR | Status: AC | PRN
  Administered 2022-09-09: 100 mL via INTRAVENOUS

## 2022-09-11 ENCOUNTER — Inpatient Hospital Stay: Payer: No Typology Code available for payment source | Attending: Hematology

## 2022-09-11 VITALS — BP 158/77 | HR 66 | Temp 96.6°F | Resp 18

## 2022-09-11 DIAGNOSIS — N189 Chronic kidney disease, unspecified: Secondary | ICD-10-CM | POA: Insufficient documentation

## 2022-09-11 DIAGNOSIS — E876 Hypokalemia: Secondary | ICD-10-CM | POA: Insufficient documentation

## 2022-09-11 DIAGNOSIS — Z8051 Family history of malignant neoplasm of kidney: Secondary | ICD-10-CM | POA: Insufficient documentation

## 2022-09-11 DIAGNOSIS — Z801 Family history of malignant neoplasm of trachea, bronchus and lung: Secondary | ICD-10-CM | POA: Insufficient documentation

## 2022-09-11 DIAGNOSIS — D631 Anemia in chronic kidney disease: Secondary | ICD-10-CM | POA: Insufficient documentation

## 2022-09-11 DIAGNOSIS — E039 Hypothyroidism, unspecified: Secondary | ICD-10-CM | POA: Insufficient documentation

## 2022-09-11 DIAGNOSIS — C641 Malignant neoplasm of right kidney, except renal pelvis: Secondary | ICD-10-CM | POA: Insufficient documentation

## 2022-09-11 DIAGNOSIS — Z803 Family history of malignant neoplasm of breast: Secondary | ICD-10-CM | POA: Insufficient documentation

## 2022-09-11 DIAGNOSIS — Z9012 Acquired absence of left breast and nipple: Secondary | ICD-10-CM | POA: Insufficient documentation

## 2022-09-11 DIAGNOSIS — Z853 Personal history of malignant neoplasm of breast: Secondary | ICD-10-CM | POA: Insufficient documentation

## 2022-09-11 DIAGNOSIS — E119 Type 2 diabetes mellitus without complications: Secondary | ICD-10-CM | POA: Insufficient documentation

## 2022-09-11 DIAGNOSIS — D649 Anemia, unspecified: Secondary | ICD-10-CM

## 2022-09-11 DIAGNOSIS — I10 Essential (primary) hypertension: Secondary | ICD-10-CM | POA: Insufficient documentation

## 2022-09-11 DIAGNOSIS — C787 Secondary malignant neoplasm of liver and intrahepatic bile duct: Secondary | ICD-10-CM | POA: Insufficient documentation

## 2022-09-11 DIAGNOSIS — D509 Iron deficiency anemia, unspecified: Secondary | ICD-10-CM | POA: Insufficient documentation

## 2022-09-11 DIAGNOSIS — Z87891 Personal history of nicotine dependence: Secondary | ICD-10-CM | POA: Insufficient documentation

## 2022-09-11 DIAGNOSIS — Z8042 Family history of malignant neoplasm of prostate: Secondary | ICD-10-CM | POA: Insufficient documentation

## 2022-09-11 DIAGNOSIS — Z800 Family history of malignant neoplasm of digestive organs: Secondary | ICD-10-CM | POA: Insufficient documentation

## 2022-09-11 DIAGNOSIS — Z905 Acquired absence of kidney: Secondary | ICD-10-CM | POA: Insufficient documentation

## 2022-09-11 DIAGNOSIS — Z95828 Presence of other vascular implants and grafts: Secondary | ICD-10-CM

## 2022-09-11 LAB — MAGNESIUM: Magnesium: 1.9 mg/dL (ref 1.7–2.4)

## 2022-09-11 LAB — COMPREHENSIVE METABOLIC PANEL
ALT: 16 U/L (ref 0–44)
AST: 21 U/L (ref 15–41)
Albumin: 3.4 g/dL — ABNORMAL LOW (ref 3.5–5.0)
Alkaline Phosphatase: 88 U/L (ref 38–126)
Anion gap: 7 (ref 5–15)
BUN: 15 mg/dL (ref 8–23)
CO2: 25 mmol/L (ref 22–32)
Calcium: 8.6 mg/dL — ABNORMAL LOW (ref 8.9–10.3)
Chloride: 103 mmol/L (ref 98–111)
Creatinine, Ser: 1.08 mg/dL — ABNORMAL HIGH (ref 0.44–1.00)
GFR, Estimated: 58 mL/min — ABNORMAL LOW (ref >60)
Glucose, Bld: 152 mg/dL — ABNORMAL HIGH (ref 70–99)
Potassium: 3.2 mmol/L — ABNORMAL LOW (ref 3.5–5.1)
Sodium: 135 mmol/L (ref 135–145)
Total Bilirubin: 0.5 mg/dL (ref 0.3–1.2)
Total Protein: 6.5 g/dL (ref 6.5–8.1)

## 2022-09-11 LAB — IRON AND TIBC
Iron: 33 ug/dL (ref 28–170)
Saturation Ratios: 11 % (ref 10.4–31.8)
TIBC: 291 ug/dL (ref 250–450)
UIBC: 258 ug/dL

## 2022-09-11 LAB — CBC WITH DIFFERENTIAL/PLATELET
Abs Immature Granulocytes: 0.01 10*3/uL (ref 0.00–0.07)
Basophils Absolute: 0 10*3/uL (ref 0.0–0.1)
Basophils Relative: 1 %
Eosinophils Absolute: 0 10*3/uL (ref 0.0–0.5)
Eosinophils Relative: 1 %
HCT: 29.8 % — ABNORMAL LOW (ref 36.0–46.0)
Hemoglobin: 9.5 g/dL — ABNORMAL LOW (ref 12.0–15.0)
Immature Granulocytes: 0 %
Lymphocytes Relative: 42 %
Lymphs Abs: 1.7 10*3/uL (ref 0.7–4.0)
MCH: 28.1 pg (ref 26.0–34.0)
MCHC: 31.9 g/dL (ref 30.0–36.0)
MCV: 88.2 fL (ref 80.0–100.0)
Monocytes Absolute: 0.2 10*3/uL (ref 0.1–1.0)
Monocytes Relative: 6 %
Neutro Abs: 2.1 10*3/uL (ref 1.7–7.7)
Neutrophils Relative %: 50 %
Platelets: 208 10*3/uL (ref 150–400)
RBC: 3.38 MIL/uL — ABNORMAL LOW (ref 3.87–5.11)
RDW: 15.7 % — ABNORMAL HIGH (ref 11.5–15.5)
WBC: 4 10*3/uL (ref 4.0–10.5)
nRBC: 0 % (ref 0.0–0.2)

## 2022-09-11 LAB — VITAMIN B12: Vitamin B-12: 331 pg/mL (ref 180–914)

## 2022-09-11 LAB — FOLATE: Folate: 16.1 ng/mL (ref >5.9)

## 2022-09-11 LAB — FERRITIN: Ferritin: 74 ng/mL (ref 11–307)

## 2022-09-11 MED ORDER — HEPARIN SOD (PORK) LOCK FLUSH 100 UNIT/ML IV SOLN
500.0000 [IU] | Freq: Once | INTRAVENOUS | Status: AC
  Administered 2022-09-11: 500 [IU] via INTRAVENOUS

## 2022-09-11 MED ORDER — SODIUM CHLORIDE 0.9% FLUSH
10.0000 mL | INTRAVENOUS | Status: DC | PRN
  Administered 2022-09-11: 10 mL via INTRAVENOUS

## 2022-09-11 NOTE — Progress Notes (Signed)
Patients port flushed without difficulty.  Good blood return noted with no bruising or swelling noted at site.  Band aid applied.  VSS with discharge and left in satisfactory condition with no s/s of distress noted.   

## 2022-09-17 ENCOUNTER — Other Ambulatory Visit: Payer: Self-pay | Admitting: "Endocrinology

## 2022-09-17 ENCOUNTER — Inpatient Hospital Stay (HOSPITAL_BASED_OUTPATIENT_CLINIC_OR_DEPARTMENT_OTHER): Payer: No Typology Code available for payment source | Admitting: Hematology

## 2022-09-17 VITALS — BP 157/81 | HR 66 | Temp 98.4°F | Resp 17 | Ht 66.0 in | Wt 168.8 lb

## 2022-09-17 DIAGNOSIS — D649 Anemia, unspecified: Secondary | ICD-10-CM

## 2022-09-17 DIAGNOSIS — C641 Malignant neoplasm of right kidney, except renal pelvis: Secondary | ICD-10-CM | POA: Diagnosis not present

## 2022-09-17 DIAGNOSIS — C50912 Malignant neoplasm of unspecified site of left female breast: Secondary | ICD-10-CM

## 2022-09-17 DIAGNOSIS — E890 Postprocedural hypothyroidism: Secondary | ICD-10-CM

## 2022-09-17 MED ORDER — LENVATINIB (18 MG DAILY DOSE) 10 MG & 2 X 4 MG PO CPPK: 18.0000 mg | ORAL_CAPSULE | Freq: Every day | ORAL | 2 refills | Status: DC

## 2022-09-17 MED ORDER — EVEROLIMUS 5 MG PO TABS: 5.0000 mg | ORAL_TABLET | Freq: Every day | ORAL | 3 refills | Status: DC

## 2022-09-17 NOTE — Patient Instructions (Addendum)
[facility]  Discharge Instructions  You were seen and examined today by Dr. Delton Coombes.  Dr. Delton Coombes discussed your most recent lab work and CT scan which revealed that your iron and hemoglobin is slightly decreased. This can happen due to the pills. Your scan looks good and stable.  Start taking one iron supplement daily along with stool softener if it constipates you. Eat some fruit everyday to help your potassium levels.  Follow-up as scheduled in 6 weeks with labs.    Thank you for choosing [facility] to provide your oncology and hematology care.   To afford each patient quality time with our provider, please arrive at least 15 minutes before your scheduled appointment time. You may need to reschedule your appointment if you arrive late (10 or more minutes). Arriving late affects you and other patients whose appointments are after yours.  Also, if you miss three or more appointments without notifying the office, you may be dismissed from the clinic at the provider's discretion.    Again, thank you for choosing [facility], [facility], [facility].  Our hope is that these requests will decrease the amount of time that you wait before being seen by our physicians.   If you have a lab appointment with the [facility] please come in thru the Main Entrance and check in at the main information desk.           _____________________________________________________________  Should you have questions after your visit to [facility], please contact our office at [phone] and follow the prompts.  Our office hours are 8:00 a.m. to 4:30 p.m. Monday - Thursday and 8:00 a.m. to 2:30 p.m. Friday.  Please note that voicemails left after 4:00 p.m. may not be returned until the following business day.  We are closed weekends and all major holidays.  You do have access to a nurse 24-7, just call the main number to the clinic [phone] and  do not press any options, hold on the line and a nurse will answer the phone.    For prescription refill requests, have your pharmacy contact our office and allow 72 hours.    Masks are optional in the cancer centers. If you would like for your care team to wear a mask while they are taking care of you, please let them know. You may have one support person who is at least 63 years old accompany you for your appointments.

## 2022-09-17 NOTE — Progress Notes (Signed)
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  TMB was low.  CCND3 amplified.  PBRM1 VUS present.  CURRENT THERAPY: Lenvatinib and everolimus  BRIEF ONCOLOGIC HISTORY:  Oncology History  Renal cell cancer ([place])  06/20/2021 Initial Diagnosis   Renal cell carcinoma of right kidney ([place])   11/19/2021 - 01/24/2022 Chemotherapy   Patient is on Treatment Plan : RENAL CELL CARCINOMA Nivolumab + Ipilimumab q21d / Nivolumab q28d      Genetic Testing   Negative genetic testing. No pathogenic variants identified on the [facility] Multi-Cancer Panel + RNA. VUS in PMS2 called c.1732C>A and in RECQL4 called c.2967G>A  Identified. The report date is 11/01/2021.   The Multi-Cancer Panel + RNA offered by [facility] includes sequencing and/or deletion duplication testing of the following 84 genes: AIP, ALK, APC, ATM, AXIN2,BAP1,  BARD1, BLM, BMPR1A, BRCA1, BRCA2, BRIP1, CASR, CDC73, CDH1, CDK4, CDKN1B, CDKN1C, CDKN2A (p14ARF), CDKN2A (p16INK4a), CEBPA, CHEK2, CTNNA1, DICER1, DIS3L2, EGFR (c.2369C>T, p.Thr790Met variant only), EPCAM (Deletion/duplication testing only), FH, FLCN, GATA2, GPC3, GREM1 (Promoter region deletion/duplication testing only), HOXB13 (c.251G>A, p.Gly84Glu), HRAS, KIT, MAX, MEN1, MET, MITF (c.952G>A, p.Glu318Lys variant only), MLH1, MSH2, MSH3, MSH6, MUTYH, NBN, NF1, NF2, NTHL1, PALB2, PDGFRA, PHOX2B, PMS2, POLD1, POLE, POT1, PRKAR1A, PTCH1, PTEN, RAD50, RAD51C, RAD51D, RB1, RECQL4, RET, RUNX1, SDHAF2, SDHA (sequence changes only), SDHB, SDHC, SDHD, SMAD4, SMARCA4, SMARCB1, SMARCE1, STK11, SUFU, TERC, TERT, TMEM127, TP53, TSC1, TSC2, VHL,  WRN and WT1.      CANCER STAGING:  Cancer Staging  Malignant neoplasm of female breast [facility]) Staging form: Breast, AJCC 6th Edition - Clinical: Stage I (T1b, N0, M0) - Signed by Baird Cancer, PA on 08/16/2011  Renal cell cancer [facility]) Staging form: Kidney, AJCC 8th Edition - Clinical stage from 09/26/2021: Stage IV [name], cNX, cM1) - Unsigned   INTERVAL HISTORY:  Deanna Bush, a 61 y.o. female, seen for follow-up of metastatic clear-cell renal cell carcinoma.  She is tolerating lenvatinib 18 mg daily and everolimus 5 mg daily reasonably well.  Denies any hand-foot skin reaction.  She had diarrhea last week 1 to 2 days and improved.  She is not taking iron tablet daily.  She takes it once or twice a week.  She reports constipation if she takes it daily.  REVIEW OF SYSTEMS:  Review of Systems  Constitutional:  Negative for appetite change and fatigue.  HENT:   Negative for mouth sores.   Gastrointestinal:  Negative for nausea and vomiting.  Genitourinary:  Negative for dysuria.   Musculoskeletal:  Negative for back pain.  All other systems reviewed and are negative.   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[place])    left    Cellulitis of leg, left    Complication of anesthesia    Hard to wake up   Diabetes mellitus without complication ([place])    Pt denies   Family history of breast cancer 08/16/2011   Family history of breast cancer    Family history of colon cancer    Family history of kidney cancer    Family history of prostate cancer  Hyperlipidemia    Hypertension    Hypothyroidism    MRSA (methicillin resistant staph aureus) culture positive 08/19/2011   Pre-diabetes    Past Surgical History:  Procedure Laterality Date   BREAST SURGERY Left    mastectomy   CESAREAN SECTION     x2   COLONOSCOPY N/A 03/03/2020   Procedure: COLONOSCOPY;  Surgeon: Daneil Dolin, MD;  Location: [facility];   Service: Endoscopy;  Laterality: N/A;  12:00   IR IMAGING GUIDED PORT INSERTION  11/07/2021   left mastectomy     NEPHRECTOMY Right 02/06/2021   Procedure: NEPHRECTOMY- open radical;  Surgeon: Cleon Gustin, MD;  Location: [facility];  Service: Urology;  Laterality: Right;   POLYPECTOMY  03/03/2020   Procedure: POLYPECTOMY;  Surgeon: Daneil Dolin, MD;  Location: [facility];  Service: Endoscopy;;    SOCIAL HISTORY:  Social History   Socioeconomic History   Marital status: Married    Spouse name: Not on file   Number of children: 2   Years of education: Not on file   Highest education level: Not on file  Occupational History   Occupation: CNA  Tobacco Use   Smoking status: Former    Packs/day: 0.50    Years: 18.00    Total pack years: 9.00    Types: Cigarettes    Quit date: 07/01/2002    Years since quitting: 20.2   Smokeless tobacco: Never  Vaping Use   Vaping Use: Never used  Substance and Sexual Activity   Alcohol use: No   Drug use: No   Sexual activity: Not Currently  Other Topics Concern   Not on file  Social History Narrative   Not on file   Social Determinants of Health   Financial Resource Strain: Not on file  Food Insecurity: Not on file  Transportation Needs: Not on file  Physical Activity: Not on file  Stress: Not on file  Social Connections: Not on file  Intimate Partner Violence: Not on file    FAMILY HISTORY:  Family History  Problem Relation Age of Onset   Hypertension Mother    Diabetes Mother    Hyperlipidemia Mother    Colon cancer Maternal Aunt        dx 92s   Prostate cancer Maternal Uncle    Throat cancer Maternal Uncle    Kidney cancer Maternal Grandfather    Lung cancer Half-Sister    Kidney cancer Half-Sister 32   Breast cancer Half-Sister 47   Breast cancer Half-Sister 35    CURRENT MEDICATIONS:  Current Outpatient Medications  Medication Sig Dispense Refill   acetaminophen (TYLENOL) 500 MG tablet Take 500 mg by mouth  every 6 (six) hours as needed for moderate pain or headache.     amLODipine (NORVASC) 5 MG tablet TAKE 1 TABLET BY MOUTH DAILY. 90 tablet 2   atorvastatin (LIPITOR) 10 MG tablet TAKE ONE TABLET BY MOUTH ONCE DAILY 90 tablet 3   EPINEPHrine 0.3 mg/0.3 mL IJ SOAJ injection Inject 0.3 mg into the muscle as needed for anaphylaxis. 2 each 0   Iron, Ferrous Sulfate, 325 (65 Fe) MG TABS Take 325 mg by mouth daily. 30 tablet 3   levothyroxine (SYNTHROID) 112 MCG tablet Take 1 tablet (112 mcg total) by mouth daily before breakfast. 90 tablet 1   Multiple Vitamin (MULTIVITAMIN WITH MINERALS) TABS tablet Take 1 tablet by mouth daily.     prochlorperazine (COMPAZINE) 10 MG tablet TAKE 1 TABLET (10 MG TOTAL) BY  MOUTH EVERY 6 (SIX) HOURS AS NEEDED FOR NAUSEA OR VOMITING. 210 tablet 2   UNABLE TO FIND MASTECTOMY BRA AND PROTHESIS DX Z90.12 6 each 2   UNABLE TO FIND 6 mastectomy prosthesis and bras. 6 each 0   urea (CARMOL) 10 % cream Apply topically 2 (two) times daily. Apply twice daily to hands 71 g 0   everolimus (AFINITOR) 5 MG tablet Take 1 tablet (5 mg total) by mouth daily. 30 tablet 3   lenvatinib 18 mg daily dose (LENVIMA) 10 MG & 2 x 4 MG capsule Take 18 mg by mouth daily. 90 capsule 2   No current facility-administered medications for this visit.   Facility-Administered Medications Ordered in Other Visits  Medication Dose Route Frequency Provider Last Rate Last Admin   heparin lock flush 100 unit/mL  500 Units Intravenous Once Derek Jack, MD       sodium chloride flush (NS) 0.9 % injection 10 mL  10 mL Intravenous Once Derek Jack, MD        ALLERGIES:  Allergies  Allergen Reactions   Sulfonamide Derivatives Itching   Yellow Jacket Venom [Bee Venom] Rash    urticaria    PHYSICAL EXAM:  Performance status (ECOG): 0 - Asymptomatic  Vitals:   09/17/22 1426  BP: (!) 157/81  Pulse: 66  Resp: 17  Temp: 98.4 F (36.9 C)  SpO2: 100%    Wt Readings from Last 3  Encounters:  09/17/22 168 lb 12.8 oz (76.6 kg)  08/07/22 171 lb 12.8 oz (77.9 kg)  07/23/22 173 lb 6.4 oz (78.7 kg)   Physical Exam Vitals reviewed.  Constitutional:      Appearance: Normal appearance.  Cardiovascular:     Rate and Rhythm: Normal rate and regular rhythm.     Pulses: Normal pulses.     Heart sounds: Normal heart sounds.  Pulmonary:     Effort: Pulmonary effort is normal.     Breath sounds: Normal breath sounds.  Musculoskeletal:     Right lower leg: No edema.     Left lower leg: No edema.  Skin:    Findings: No erythema. Rash is not vesicular.     Comments: Skin peeling on both palms  Neurological:     General: No focal deficit present.     Mental Status: She is alert and oriented to person, place, and time.  Psychiatric:        Mood and Affect: Mood normal.        Behavior: Behavior normal.      LABORATORY DATA:  I have reviewed the labs as listed.     Latest Ref Rng & Units 09/11/2022    2:14 PM 08/07/2022    1:48 PM 06/12/2022   10:57 AM  CBC  WBC 4.0 - 10.5 K/uL 4.0  4.6  4.8   Hemoglobin 12.0 - 15.0 g/dL 9.5  9.9  10.0   Hematocrit 36.0 - 46.0 % 29.8  30.7  31.6   Platelets 150 - 400 K/uL 208  272  223       Latest Ref Rng & Units 09/11/2022    2:14 PM 08/07/2022    1:48 PM 06/12/2022   10:57 AM  CMP  Glucose 70 - 99 mg/dL 152  93  107   BUN 8 - 23 mg/dL _0 Creatinine 0.44 - 1.00 mg/dL 1.08  1.13  0.94   Sodium 135 - 145 mmol/L 135  136  136  Potassium 3.5 - 5.1 mmol/L 3.2  3.4  3.7   Chloride 98 - 111 mmol/L 103  104  105   CO2 22 - 32 mmol/L _0 Calcium 8.9 - 10.3 mg/dL 8.6  9.0  8.8   Total Protein 6.5 - 8.1 g/dL 6.5  7.4  7.5   Total Bilirubin 0.3 - 1.2 mg/dL 0.5  0.4  0.6   Alkaline Phos 38 - 126 U/L 88  98  96   AST 15 - 41 U/L _1 ALT 0 - 44 U/L _2 DIAGNOSTIC IMAGING:  I have independently reviewed the scans and discussed with the patient.    ASSESSMENT:  Metastatic clear-cell renal cell  carcinoma: - Right radical nephrectomy on 02/06/2021. - Pathology shows clear-cell RCC, grade 4, 12.5 cm, necrosis and rhabdoid features are identified.  Tumor extends into the and invades the wall of the vena cava (pT3c).  Vascular, ureteral and all margins of resection are negative for tumor. - CT scan abdomen with and without contrast on 05/29/2021 showed several nodules along the peritoneal surface of the right nephrectomy bed warranting close attention. - CTAP with and without contrast on 09/18/2021 showed progressive nodularity within the right nephrectomy bed, with enlarged nodules adjacent to the right hepatic lobe extending inferiorly along the right retroperitoneum with multiple enlarged enhancing nodules.  Direct metastatic invasion into the right hepatic lobe.  Small nodule at the right lung base favored to be benign. - IMDC: Intermediate risk group with 2 features including diagnosis less than 1 year and hemoglobin lower than normal. - Bone scan on 10/05/2021 with focal activity in the mental vertex of the mandible which could be inflammatory/traumatic/metastatic. - CT chest on 10/03/2021 showed several lung nodules scattered bilaterally some of which could be metastatic and many others could be due to mucoid impaction. - NGS testing did not show any targetable mutations.  TMB was low.  CCND3 amplified.  PBRM1 VUS present. - Right nephrectomy bed biopsy (10/22/2021): Clear-cell renal cell carcinoma with necrosis - 4 cycles of ipilimumab and nivolumab from 11/19/2021 through 01/24/2022 - CT CAP on 02/14/2022: Numerous new and enlarged lung nodules.  Significant interval increase in the mass in the right renal bed.  New hypodense liver lesions. - Lenvatinib (12 mg) and everolimus 5 mg daily started on 03/13/2022    Social/family history: - She lives at home with her husband and also takes care of her mother. - She works as a Buyer, retail at [employer].  She quit smoking in 2004 and  smoked half pack per day for 10 years. - Half sister (same mother) had kidney cancer at age 18.  Maternal grandfather had kidney cancer.  Sister died of lung cancer.  2 half sisters (same father) had breast cancers.  3.  Left breast stage I tubular adenocarcinoma: - She underwent left mastectomy on 06/11/2002.  0/6 lymph nodes positive.  ER 90%, PR 92%, Ki-67 6%, HER2 negative.  As per chart, declined antiestrogen therapy.   PLAN:  Metastatic clear-cell RCC, intermediate risk by IMDC: - CT CAP (09/09/2022): Stable multifocal liver mets, stable to mild decrease of the lesions in the right retroperitoneum and right pericolic gutter.  Stable enhancing mass within the posterolateral abdominal wall.  Trace amount of ascites within the dependent portion of the pelvis. - Reviewed labs from 09/11/2022.  LFTs are normal.  Creatinine is 1.08.  WBC  and platelets are normal. - Continue lenvatinib 18 mg daily and everolimus 5 mg daily. - She has mild hypokalemia with potassium 3.2.  Recommend that she start eating a fruit every day.  If it continues to be low, will consider potassium supplements. - RTC 6 weeks for follow-up.  2.  Normocytic anemia: - Normocytic anemia from CKD, iron deficiency and myelosuppression. - Ferritin is 74, percent saturation 11.  Hemoglobin 9.5. - Recommend that she start taking iron tablet daily with stool softener.  She is currently taking 1-2 times per week. - Will plan to repeat ferritin and iron panel in 6 weeks.  If no improvement, we will recommend parenteral iron therapy.  3.  Hypothyroidism: - Continue Synthroid 112 mcg daily. - She will check TSH next week with Dr. Dorris Fetch.    Orders placed this encounter:  Orders Placed This Encounter  Procedures   CBC with Differential/Platelet   Comprehensive metabolic panel   Magnesium   Ferritin   Iron and TIBC      Derek Jack, MD [place] [phone]

## 2022-09-18 ENCOUNTER — Telehealth (HOSPITAL_COMMUNITY): Payer: Self-pay | Admitting: Pharmacist

## 2022-09-18 ENCOUNTER — Other Ambulatory Visit: Payer: Self-pay

## 2022-09-18 ENCOUNTER — Other Ambulatory Visit (HOSPITAL_COMMUNITY): Payer: Self-pay

## 2022-09-18 DIAGNOSIS — C641 Malignant neoplasm of right kidney, except renal pelvis: Secondary | ICD-10-CM

## 2022-09-18 MED ORDER — EVEROLIMUS 5 MG PO TABS
5.0000 mg | ORAL_TABLET | Freq: Every day | ORAL | 3 refills | Status: DC
  Filled 2022-09-18: qty 30, 30d supply, fill #0

## 2022-09-18 MED ORDER — EVEROLIMUS 5 MG PO TABS: 5.0000 mg | ORAL_TABLET | Freq: Every day | ORAL | 3 refills | Status: DC

## 2022-09-18 MED ORDER — LENVATINIB (18 MG DAILY DOSE) 10 MG & 2 X 4 MG PO CPPK: 18.0000 mg | ORAL_CAPSULE | Freq: Every day | ORAL | 2 refills | Status: DC

## 2022-09-18 MED ORDER — LENVATINIB (18 MG DAILY DOSE) 10 MG & 2 X 4 MG PO CPPK
18.0000 mg | ORAL_CAPSULE | Freq: Every day | ORAL | 2 refills | Status: DC
  Filled 2022-09-18: qty 90, 90d supply, fill #0

## 2022-09-18 NOTE — Telephone Encounter (Signed)
Oral Oncology Pharmacist Encounter  Prescription refills for Lenvima and Afinitor sent to [facility] in error. Patient enrolled in manufacturer assistance and receives [place] through [employer] and [employer] through [employer] by [employer] ([place] location). Prescriptions redirected to the above pharmacies for dispensing.   Leron Croak, PharmD, BCPS, BCOP Hematology/Oncology Clinical Pharmacist [facility] and [place] [phone] 09/18/2022 10:40 AM

## 2022-09-26 ENCOUNTER — Other Ambulatory Visit (HOSPITAL_COMMUNITY): Payer: Self-pay

## 2022-10-15 LAB — CMP14+EGFR
ALT: 18 IU/L (ref 0–32)
AST: 24 IU/L (ref 0–40)
Albumin/Globulin Ratio: 1.3 (ref 1.2–2.2)
Albumin: 3.9 g/dL (ref 3.9–4.9)
Alkaline Phosphatase: 115 IU/L (ref 44–121)
BUN/Creatinine Ratio: 11 — ABNORMAL LOW (ref 12–28)
BUN: 12 mg/dL (ref 8–27)
Bilirubin Total: 0.3 mg/dL (ref 0.0–1.2)
CO2: 24 mmol/L (ref 20–29)
Calcium: 9.6 mg/dL (ref 8.7–10.3)
Chloride: 103 mmol/L (ref 96–106)
Creatinine, Ser: 1.07 mg/dL — ABNORMAL HIGH (ref 0.57–1.00)
Globulin, Total: 2.9 g/dL (ref 1.5–4.5)
Glucose: 97 mg/dL (ref 70–99)
Potassium: 4.7 mmol/L (ref 3.5–5.2)
Sodium: 141 mmol/L (ref 134–144)
Total Protein: 6.8 g/dL (ref 6.0–8.5)
eGFR: 58 mL/min/{1.73_m2} — ABNORMAL LOW (ref >59)

## 2022-10-15 LAB — LIPID PANEL
Chol/HDL Ratio: 3.7 ratio (ref 0.0–4.4)
Cholesterol, Total: 254 mg/dL — ABNORMAL HIGH (ref 100–199)
HDL: 69 mg/dL (ref >39)
LDL Chol Calc (NIH): 165 mg/dL — ABNORMAL HIGH (ref 0–99)
Triglycerides: 113 mg/dL (ref 0–149)
VLDL Cholesterol Cal: 20 mg/dL (ref 5–40)

## 2022-10-23 ENCOUNTER — Other Ambulatory Visit (HOSPITAL_COMMUNITY): Payer: Self-pay

## 2022-10-25 ENCOUNTER — Encounter: Payer: Self-pay | Admitting: Family Medicine

## 2022-10-25 ENCOUNTER — Ambulatory Visit (INDEPENDENT_AMBULATORY_CARE_PROVIDER_SITE_OTHER): Payer: No Typology Code available for payment source | Admitting: Family Medicine

## 2022-10-25 ENCOUNTER — Other Ambulatory Visit (HOSPITAL_COMMUNITY)
Admission: RE | Admit: 2022-10-25 | Discharge: 2022-10-25 | Disposition: A | Discharge status: Home / Self Care | Payer: No Typology Code available for payment source | Source: Ambulatory Visit | Attending: Family Medicine | Admitting: Family Medicine

## 2022-10-25 VITALS — BP 132/82 | HR 79 | Ht 66.0 in | Wt 164.0 lb

## 2022-10-25 DIAGNOSIS — Z0001 Encounter for general adult medical examination with abnormal findings: Secondary | ICD-10-CM

## 2022-10-25 DIAGNOSIS — E782 Mixed hyperlipidemia: Secondary | ICD-10-CM | POA: Diagnosis not present

## 2022-10-25 DIAGNOSIS — F320 Major depressive disorder, single episode, mild: Secondary | ICD-10-CM | POA: Diagnosis not present

## 2022-10-25 DIAGNOSIS — Z124 Encounter for screening for malignant neoplasm of cervix: Secondary | ICD-10-CM | POA: Insufficient documentation

## 2022-10-25 DIAGNOSIS — Z113 Encounter for screening for infections with a predominantly sexual mode of transmission: Secondary | ICD-10-CM

## 2022-10-25 DIAGNOSIS — Z634 Disappearance and death of family member: Secondary | ICD-10-CM

## 2022-10-25 DIAGNOSIS — R87610 Atypical squamous cells of undetermined significance on cytologic smear of cervix (ASC-US): Secondary | ICD-10-CM

## 2022-10-25 DIAGNOSIS — F4321 Adjustment disorder with depressed mood: Secondary | ICD-10-CM

## 2022-10-25 DIAGNOSIS — R87810 Cervical high risk human papillomavirus (HPV) DNA test positive: Secondary | ICD-10-CM

## 2022-10-25 DIAGNOSIS — F411 Generalized anxiety disorder: Secondary | ICD-10-CM

## 2022-10-25 MED ORDER — MIRTAZAPINE 7.5 MG PO TABS: 7.5000 mg | ORAL_TABLET | Freq: Every day | ORAL | 0 refills | Status: DC

## 2022-10-25 MED ORDER — MIRTAZAPINE 7.5 MG PO TABS: 7.5000 mg | ORAL_TABLET | Freq: Every day | ORAL | 3 refills | Status: DC

## 2022-10-25 NOTE — Patient Instructions (Signed)
Follow-up in 6 to 8 weeks, call if you need me sooner.  New medication for depression and anxiety is Remeron 7.5 mg 1 at bedtime.  Kidney and liver function look good.  Your cholesterol is high, I will discuss with oncology increasing the dose of your statin.before making any changes  Work excuse from 01/15/to return 10/28/2022  You are being referred to therapy I will send a message regarding that referral once I have established who the provider will be, as we discussed  Thanks for choosing [facility], we consider it a privelige to serve you.

## 2022-10-27 ENCOUNTER — Encounter: Payer: Self-pay | Admitting: Family Medicine

## 2022-10-27 DIAGNOSIS — Z113 Encounter for screening for infections with a predominantly sexual mode of transmission: Secondary | ICD-10-CM | POA: Insufficient documentation

## 2022-10-27 DIAGNOSIS — F4321 Adjustment disorder with depressed mood: Secondary | ICD-10-CM | POA: Insufficient documentation

## 2022-10-27 DIAGNOSIS — Z0001 Encounter for general adult medical examination with abnormal findings: Secondary | ICD-10-CM | POA: Insufficient documentation

## 2022-10-27 NOTE — Assessment & Plan Note (Signed)
Testing per pt request

## 2022-10-27 NOTE — Assessment & Plan Note (Signed)
Hyperlipidemia:Low fat diet discussed and encouraged.   Lipid Panel  Lab Results  Component Value Date   CHOL 254 (H) 10/14/2022   HDL 69 10/14/2022   LDLCALC 165 (H) 10/14/2022   TRIG 113 10/14/2022   CHOLHDL 3.7 10/14/2022     Deteriorated, will check with Oncology about inc statin doe, pt has concern that she has liver mets

## 2022-10-27 NOTE — Progress Notes (Signed)
    Deanna Bush     MRN: [id number]      DOB: 09-18-1961  HPI: Patient is in for annual physical exam. Wants STD screening Lost daughter to colon cancer at age 64 on 10/21/2022, requests leave of absence from work for the week as grieving and now depressed dealing with her own metastatic kidney cancer and marital problems ready for medication and counselling Recent labs,  are reviewed. Immunization is reviewed , and  updated if needed.   PE: BP 132/82   Pulse 79   Ht '5\' 6"'$  (1.676 m)   Wt 164 lb (74.4 kg)   SpO2 96%   BMI 26.47 kg/m   Pleasant  female, alert and oriented x 3, in no cardio-pulmonary distress. Afebrile. HEENT No facial trauma or asymetry. Sinuses non tender.  Extra occullar muscles intact.. External ears normal, . Neck: supple, no adenopathy,JVD or thyromegaly.No bruits.  Chest: Clear to ascultation bilaterally.No crackles or wheezes. Non tender to palpation  Breast: Left partial mastectomy, no masses or lumps. No tenderness. No nipple discharge or inversion. No axillary or supraclavicular adenopathy  Cardiovascular system; Heart sounds normal,  S1 and  S2 ,no S3.  No murmur, or thrill. Apical beat not displaced Peripheral pulses normal.  Abdomen: Soft, non tender, no organomegaly or masses. No bruits. Bowel sounds normal. No guarding, tenderness or rebound.   GU: External genitalia normal female genitalia , normal female distribution of hair. No lesions. Urethral meatus normal in size, no  Prolapse, no lesions visibly  Present. Bladder non tender. Vagina pink and moist , with no visible lesions , discharge present . Adequate pelvic support no  cystocele or rectocele noted Cervix pink and appears healthy, no lesions or ulcerations noted, no discharge noted from os Uterus normal size, no adnexal masses, no cervical motion or adnexal tenderness.   Musculoskeletal exam: Full ROM of spine, hips , shoulders and knees. No deformity ,swelling or  crepitus noted. No muscle wasting or atrophy.   Neurologic: Cranial nerves 2 to 12 intact. Power, tone ,sensation and reflexes normal throughout. No disturbance in gait. No tremor.  Skin: Intact, no ulceration, erythema , scaling or rash noted. Vitiligo of introitus  Psych; Mildly depressed  mood and affect. Judgement and concentration normal   Assessment & Plan:  Annual visit for general adult medical examination with abnormal findings Annual exam as documented.  Immunization and cancer screening needs are specifically addressed at this visit.   Depression, major, single episode, mild ([place]) Start medication an refer to therapy  Mixed hyperlipidemia Hyperlipidemia:Low fat diet discussed and encouraged.   Lipid Panel  Lab Results  Component Value Date   CHOL 254 (H) 10/14/2022   HDL 69 10/14/2022   LDLCALC 165 (H) 10/14/2022   TRIG 113 10/14/2022   CHOLHDL 3.7 10/14/2022     Deteriorated, will check with Oncology about inc statin doe, pt has concern that she has liver mets  Grief at loss of child Work excuse from 1/15 to 122/2024  Screening for STD (sexually transmitted disease) Testing per pt request

## 2022-10-27 NOTE — Assessment & Plan Note (Signed)
Start medication an refer to therapy

## 2022-10-27 NOTE — Assessment & Plan Note (Signed)
Annual exam as documented. . Immunization and cancer screening needs are specifically addressed at this visit.  

## 2022-10-27 NOTE — Assessment & Plan Note (Signed)
Work excuse from 1/15 to 122/2024

## 2022-10-28 NOTE — Telephone Encounter (Signed)
Called to check status of application. Informed by representative that all information needed was in hand and currently under processing. I will continue to follow and update until final determination.  [name], [facility]  [phone] (phone) [phone] (fax) 10/28/2022 11:17 AM

## 2022-10-29 ENCOUNTER — Inpatient Hospital Stay: Payer: No Typology Code available for payment source | Attending: Hematology

## 2022-10-29 ENCOUNTER — Inpatient Hospital Stay (HOSPITAL_BASED_OUTPATIENT_CLINIC_OR_DEPARTMENT_OTHER): Payer: No Typology Code available for payment source | Admitting: Hematology

## 2022-10-29 ENCOUNTER — Encounter: Payer: Self-pay | Admitting: Hematology

## 2022-10-29 VITALS — BP 134/82 | HR 64 | Temp 97.0°F | Resp 16 | Wt 168.4 lb

## 2022-10-29 DIAGNOSIS — Z87891 Personal history of nicotine dependence: Secondary | ICD-10-CM | POA: Insufficient documentation

## 2022-10-29 DIAGNOSIS — Z800 Family history of malignant neoplasm of digestive organs: Secondary | ICD-10-CM | POA: Diagnosis not present

## 2022-10-29 DIAGNOSIS — E039 Hypothyroidism, unspecified: Secondary | ICD-10-CM | POA: Diagnosis not present

## 2022-10-29 DIAGNOSIS — Z801 Family history of malignant neoplasm of trachea, bronchus and lung: Secondary | ICD-10-CM | POA: Insufficient documentation

## 2022-10-29 DIAGNOSIS — D649 Anemia, unspecified: Secondary | ICD-10-CM

## 2022-10-29 DIAGNOSIS — C641 Malignant neoplasm of right kidney, except renal pelvis: Secondary | ICD-10-CM

## 2022-10-29 DIAGNOSIS — Z853 Personal history of malignant neoplasm of breast: Secondary | ICD-10-CM | POA: Diagnosis not present

## 2022-10-29 DIAGNOSIS — Z8042 Family history of malignant neoplasm of prostate: Secondary | ICD-10-CM | POA: Diagnosis not present

## 2022-10-29 DIAGNOSIS — E1122 Type 2 diabetes mellitus with diabetic chronic kidney disease: Secondary | ICD-10-CM | POA: Diagnosis not present

## 2022-10-29 DIAGNOSIS — I129 Hypertensive chronic kidney disease with stage 1 through stage 4 chronic kidney disease, or unspecified chronic kidney disease: Secondary | ICD-10-CM | POA: Insufficient documentation

## 2022-10-29 DIAGNOSIS — D631 Anemia in chronic kidney disease: Secondary | ICD-10-CM | POA: Diagnosis not present

## 2022-10-29 DIAGNOSIS — Z905 Acquired absence of kidney: Secondary | ICD-10-CM | POA: Diagnosis not present

## 2022-10-29 DIAGNOSIS — C50912 Malignant neoplasm of unspecified site of left female breast: Secondary | ICD-10-CM

## 2022-10-29 DIAGNOSIS — D509 Iron deficiency anemia, unspecified: Secondary | ICD-10-CM | POA: Diagnosis not present

## 2022-10-29 DIAGNOSIS — Z8051 Family history of malignant neoplasm of kidney: Secondary | ICD-10-CM | POA: Insufficient documentation

## 2022-10-29 DIAGNOSIS — Z803 Family history of malignant neoplasm of breast: Secondary | ICD-10-CM | POA: Insufficient documentation

## 2022-10-29 DIAGNOSIS — N189 Chronic kidney disease, unspecified: Secondary | ICD-10-CM | POA: Diagnosis not present

## 2022-10-29 DIAGNOSIS — Z95828 Presence of other vascular implants and grafts: Secondary | ICD-10-CM

## 2022-10-29 LAB — COMPREHENSIVE METABOLIC PANEL
ALT: 19 U/L (ref 0–44)
AST: 26 U/L (ref 15–41)
Albumin: 3.4 g/dL — ABNORMAL LOW (ref 3.5–5.0)
Alkaline Phosphatase: 84 U/L (ref 38–126)
Anion gap: 6 (ref 5–15)
BUN: 13 mg/dL (ref 8–23)
CO2: 26 mmol/L (ref 22–32)
Calcium: 8.5 mg/dL — ABNORMAL LOW (ref 8.9–10.3)
Chloride: 101 mmol/L (ref 98–111)
Creatinine, Ser: 1.09 mg/dL — ABNORMAL HIGH (ref 0.44–1.00)
GFR, Estimated: 57 mL/min — ABNORMAL LOW (ref >60)
Glucose, Bld: 94 mg/dL (ref 70–99)
Potassium: 3.4 mmol/L — ABNORMAL LOW (ref 3.5–5.1)
Sodium: 133 mmol/L — ABNORMAL LOW (ref 135–145)
Total Bilirubin: 0.4 mg/dL (ref 0.3–1.2)
Total Protein: 7.1 g/dL (ref 6.5–8.1)

## 2022-10-29 LAB — CERVICOVAGINAL ANCILLARY ONLY
Bacterial Vaginitis (gardnerella): POSITIVE — AB
Candida Glabrata: NEGATIVE
Candida Vaginitis: NEGATIVE
Chlamydia: NEGATIVE
Comment: NEGATIVE
Comment: NEGATIVE
Comment: NEGATIVE
Comment: NEGATIVE
Comment: NEGATIVE
Comment: NORMAL
Neisseria Gonorrhea: NEGATIVE
Trichomonas: NEGATIVE

## 2022-10-29 LAB — CBC WITH DIFFERENTIAL/PLATELET
Abs Immature Granulocytes: 0.01 10*3/uL (ref 0.00–0.07)
Basophils Absolute: 0 10*3/uL (ref 0.0–0.1)
Basophils Relative: 0 %
Eosinophils Absolute: 0 10*3/uL (ref 0.0–0.5)
Eosinophils Relative: 1 %
HCT: 32.5 % — ABNORMAL LOW (ref 36.0–46.0)
Hemoglobin: 10.4 g/dL — ABNORMAL LOW (ref 12.0–15.0)
Immature Granulocytes: 0 %
Lymphocytes Relative: 36 %
Lymphs Abs: 1.7 10*3/uL (ref 0.7–4.0)
MCH: 28.4 pg (ref 26.0–34.0)
MCHC: 32 g/dL (ref 30.0–36.0)
MCV: 88.8 fL (ref 80.0–100.0)
Monocytes Absolute: 0.2 10*3/uL (ref 0.1–1.0)
Monocytes Relative: 4 %
Neutro Abs: 2.9 10*3/uL (ref 1.7–7.7)
Neutrophils Relative %: 59 %
Platelets: 192 10*3/uL (ref 150–400)
RBC: 3.66 MIL/uL — ABNORMAL LOW (ref 3.87–5.11)
RDW: 14.2 % (ref 11.5–15.5)
WBC: 4.9 10*3/uL (ref 4.0–10.5)
nRBC: 0 % (ref 0.0–0.2)

## 2022-10-29 LAB — IRON AND TIBC
Iron: 58 ug/dL (ref 28–170)
Saturation Ratios: 20 % (ref 10.4–31.8)
TIBC: 289 ug/dL (ref 250–450)
UIBC: 231 ug/dL

## 2022-10-29 LAB — FERRITIN: Ferritin: 91 ng/mL (ref 11–307)

## 2022-10-29 LAB — MAGNESIUM: Magnesium: 2 mg/dL (ref 1.7–2.4)

## 2022-10-29 MED ORDER — SODIUM CHLORIDE 0.9% FLUSH
10.0000 mL | INTRAVENOUS | Status: DC | PRN
  Administered 2022-10-29: 10 mL via INTRAVENOUS

## 2022-10-29 MED ORDER — HEPARIN SOD (PORK) LOCK FLUSH 100 UNIT/ML IV SOLN
500.0000 [IU] | Freq: Once | INTRAVENOUS | Status: AC
  Administered 2022-10-29: 500 [IU] via INTRAVENOUS

## 2022-10-29 NOTE — Progress Notes (Signed)
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  TMB was low.  CCND3 amplified.  PBRM1 VUS present.  CURRENT THERAPY: Lenvatinib and everolimus  BRIEF ONCOLOGIC HISTORY:  Oncology History  Renal cell Bush ([place])  06/20/2021 Initial Diagnosis   Renal cell carcinoma of right kidney (Deanna Bush)   11/19/2021 - 01/24/2022 Chemotherapy   Patient is on Treatment Plan : RENAL CELL CARCINOMA Nivolumab + Ipilimumab q21d / Nivolumab q28d      Genetic Testing   Negative genetic testing. No pathogenic variants identified on the [facility] Multi-Bush Panel + RNA. VUS in PMS2 called c.1732C>A and in RECQL4 called c.2967G>A  Identified. The report date is 11/01/2021.   The Multi-Bush Panel + RNA offered by [facility] includes sequencing and/or deletion duplication testing of the following 84 genes: AIP, ALK, APC, ATM, AXIN2,BAP1,  BARD1, BLM, BMPR1A, BRCA1, BRCA2, BRIP1, CASR, CDC73, CDH1, CDK4, CDKN1B, CDKN1C, CDKN2A (p14ARF), CDKN2A (p16INK4a), CEBPA, CHEK2, CTNNA1, DICER1, DIS3L2, EGFR (c.2369C>T, p.Thr790Met variant only), EPCAM (Deletion/duplication testing only), FH, FLCN, GATA2, GPC3, GREM1 (Promoter region deletion/duplication testing only), HOXB13 (c.251G>A, p.Gly84Glu), HRAS, KIT, MAX, MEN1, MET, MITF (c.952G>A, p.Glu318Lys variant only), MLH1, MSH2, MSH3, MSH6, MUTYH, NBN, NF1, NF2, NTHL1, PALB2, PDGFRA, PHOX2B, PMS2, POLD1, POLE, POT1, PRKAR1A, PTCH1, PTEN, RAD50, RAD51C, RAD51D, RB1, RECQL4, RET, RUNX1, SDHAF2, SDHA (sequence changes only), SDHB, SDHC, SDHD, SMAD4, SMARCA4, SMARCB1, SMARCE1, STK11, SUFU, TERC, TERT, TMEM127, TP53, TSC1, TSC2, VHL,  WRN and WT1.      Bush STAGING:  Bush Staging  Malignant neoplasm of female breast [facility]) Staging form: Breast, AJCC 6th Edition - Clinical: Stage I (T1b, N0, M0) - Signed by Deanna Cancer, PA on 08/16/2011  Renal cell Bush [facility]) Staging form: Kidney, AJCC 8th Edition - Clinical stage from 09/26/2021: Stage IV Deanna Bush, cNX, cM1) - Unsigned   INTERVAL HISTORY:  Deanna Bush, a 62 y.o. female, seen for follow-up of metastatic clear-cell renal cell carcinoma.  She is tolerating lenvatinib 18 mg daily and everolimus 5 mg daily very well.  She does not report any diarrhea.  Energy levels are about 80%.  She is depressed as her stepdaughter passed away from colon Bush recently.  She also had COVID diarrhea end of December and lost some weight.  She feels slightly tired as result.  She is continuing to take iron tablet daily.  REVIEW OF SYSTEMS:  Review of Systems  Constitutional:  Positive for fatigue. Negative for appetite change.  HENT:   Negative for mouth sores.   Gastrointestinal:  Negative for nausea and vomiting.  Genitourinary:  Negative for dysuria.   Musculoskeletal:  Negative for back pain.  Psychiatric/Behavioral:  The patient is nervous/anxious.   All other systems reviewed and are negative.   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[place])    left    Cellulitis of leg, left    Complication of anesthesia    Hard to wake up   Diabetes mellitus without complication ([place])    Pt denies   Family history of breast Bush 08/16/2011   Family history of breast Bush    Family history of  colon Bush    Family history of kidney Bush    Family history of prostate Bush    Hyperlipidemia    Hypertension    Hypothyroidism    MRSA (methicillin resistant staph aureus) culture positive 08/19/2011   Pre-diabetes    Past Surgical History:  Procedure Laterality Date   BREAST SURGERY Left    mastectomy    CESAREAN SECTION     x2   COLONOSCOPY N/A 03/03/2020   Procedure: COLONOSCOPY;  Surgeon: Daneil Dolin, MD;  Location: [facility];  Service: Endoscopy;  Laterality: N/A;  12:00   IR IMAGING GUIDED PORT INSERTION  11/07/2021   left mastectomy     NEPHRECTOMY Right 02/06/2021   Procedure: NEPHRECTOMY- open radical;  Surgeon: Cleon Gustin, MD;  Location: [facility];  Service: Urology;  Laterality: Right;   POLYPECTOMY  03/03/2020   Procedure: POLYPECTOMY;  Surgeon: Daneil Dolin, MD;  Location: [facility];  Service: Endoscopy;;    SOCIAL HISTORY:  Social History   Socioeconomic History   Marital status: Married    Spouse name: Not on file   Number of children: 2   Years of education: Not on file   Highest education level: Not on file  Occupational History   Occupation: CNA  Tobacco Use   Smoking status: Former    Packs/day: 0.50    Years: 18.00    Total pack years: 9.00    Types: Cigarettes    Quit date: 07/01/2002    Years since quitting: 20.3   Smokeless tobacco: Never  Vaping Use   Vaping Use: Never used  Substance and Sexual Activity   Alcohol use: No   Drug use: No   Sexual activity: Not Currently  Other Topics Concern   Not on file  Social History Narrative   Not on file   Social Determinants of Health   Financial Resource Strain: Not on file  Food Insecurity: Not on file  Transportation Needs: Not on file  Physical Activity: Not on file  Stress: Not on file  Social Connections: Not on file  Intimate Partner Violence: Not on file    FAMILY HISTORY:  Family History  Problem Relation Age of Onset   Hypertension Mother    Diabetes Mother    Hyperlipidemia Mother    Colon Bush Maternal Aunt        dx 82s   Prostate Bush Maternal Uncle    Throat Bush Maternal Uncle    Kidney Bush Maternal Grandfather    Lung Bush Half-Sister    Kidney Bush Half-Sister 98   Breast Bush Half-Sister 88   Breast Bush Half-Sister 25    CURRENT  MEDICATIONS:  Current Outpatient Medications  Medication Sig Dispense Refill   acetaminophen (TYLENOL) 500 MG tablet Take 500 mg by mouth every 6 (six) hours as needed for moderate pain or headache.     amLODipine (NORVASC) 5 MG tablet TAKE 1 TABLET BY MOUTH DAILY. 90 tablet 2   atorvastatin (LIPITOR) 10 MG tablet TAKE ONE TABLET BY MOUTH ONCE DAILY 90 tablet 3   EPINEPHrine 0.3 mg/0.3 mL IJ SOAJ injection Inject 0.3 mg into the muscle as needed for anaphylaxis. 2 each 0   everolimus (AFINITOR) 5 MG tablet Take 1 tablet (5 mg total) by mouth daily. 30 tablet 3   Iron, Ferrous Sulfate, 325 (65 Fe) MG TABS Take 325 mg by mouth daily. 30 tablet 3   lenvatinib 18 mg daily dose (LENVIMA) 10 MG & 2  x 4 MG capsule Take 18 mg by mouth daily. 90 capsule 2   levothyroxine (SYNTHROID) 112 MCG tablet Take 1 tablet (112 mcg total) by mouth daily before breakfast. 90 tablet 1   levothyroxine (SYNTHROID) 112 MCG tablet Take 1 tablet (112 mcg total) by mouth daily. 90 tablet 0   Multiple Vitamin (MULTIVITAMIN WITH MINERALS) TABS tablet Take 1 tablet by mouth daily.     prochlorperazine (COMPAZINE) 10 MG tablet TAKE 1 TABLET (10 MG TOTAL) BY MOUTH EVERY 6 (SIX) HOURS AS NEEDED FOR NAUSEA OR VOMITING. 210 tablet 2   UNABLE TO FIND MASTECTOMY BRA AND PROTHESIS DX Z90.12 6 each 2   UNABLE TO FIND 6 mastectomy prosthesis and bras. 6 each 0   urea (CARMOL) 10 % cream Apply topically 2 (two) times daily. Apply twice daily to hands 71 g 0   mirtazapine (REMERON) 7.5 MG tablet Take 1 tablet (7.5 mg total) by mouth at bedtime. (Patient not taking: Reported on 10/29/2022) 90 tablet 0   No current facility-administered medications for this visit.   Facility-Administered Medications Ordered in Other Visits  Medication Dose Route Frequency Provider Last Rate Last Admin   heparin lock flush 100 unit/mL  500 Units Intravenous Once Derek Jack, MD       sodium chloride flush (NS) 0.9 % injection 10 mL  10 mL  Intravenous Once Derek Jack, MD        ALLERGIES:  Allergies  Allergen Reactions   Sulfonamide Derivatives Itching   Yellow Jacket Venom [Bee Venom] Rash    urticaria    PHYSICAL EXAM:  Performance status (ECOG): 0 - Asymptomatic  There were no vitals filed for this visit.   Wt Readings from Last 3 Encounters:  10/29/22 168 lb 6.4 oz (76.4 kg)  10/25/22 164 lb (74.4 kg)  09/17/22 168 lb 12.8 oz (76.6 kg)   Physical Exam Vitals reviewed.  Constitutional:      Appearance: Normal appearance.  Cardiovascular:     Rate and Rhythm: Normal rate and regular rhythm.     Pulses: Normal pulses.     Heart sounds: Normal heart sounds.  Pulmonary:     Effort: Pulmonary effort is normal.     Breath sounds: Normal breath sounds.  Musculoskeletal:     Right lower leg: No edema.     Left lower leg: No edema.  Skin:    Findings: No erythema. Rash is not vesicular.     Comments: Skin peeling on both palms  Neurological:     General: No focal deficit present.     Mental Status: She is alert and oriented to person, place, and time.  Psychiatric:        Mood and Affect: Mood normal.        Behavior: Behavior normal.      LABORATORY DATA:  I have reviewed the labs as listed.     Latest Ref Rng & Units 10/29/2022    1:58 PM 09/11/2022    2:14 PM 08/07/2022    1:48 PM  CBC  WBC 4.0 - 10.5 K/uL 4.9  4.0  4.6   Hemoglobin 12.0 - 15.0 g/dL 10.4  9.5  9.9   Hematocrit 36.0 - 46.0 % 32.5  29.8  30.7   Platelets 150 - 400 K/uL 192  208  272       Latest Ref Rng & Units 10/29/2022    1:58 PM 10/14/2022   12:12 PM 09/11/2022    2:14 PM  CMP  Glucose 70 - 99 mg/dL 94  97  152   BUN 8 - 23 mg/dL '13  12  15   '$ Creatinine 0.44 - 1.00 mg/dL 1.09  1.07  1.08   Sodium 135 - 145 mmol/L 133  141  135   Potassium 3.5 - 5.1 mmol/L 3.4  4.7  3.2   Chloride 98 - 111 mmol/L 101  103  103   CO2 22 - 32 mmol/L '26  24  25   '$ Calcium 8.9 - 10.3 mg/dL 8.5  9.6  8.6   Total Protein 6.5 - 8.1  g/dL 7.1  6.8  6.5   Total Bilirubin 0.3 - 1.2 mg/dL 0.4  0.3  0.5   Alkaline Phos 38 - 126 U/L 84  115  88   AST 15 - 41 U/L '26  24  21   '$ ALT 0 - 44 U/L '19  18  16     '$ DIAGNOSTIC IMAGING:  I have independently reviewed the scans and discussed with the patient.    ASSESSMENT:  Metastatic clear-cell renal cell carcinoma: - Right radical nephrectomy on 02/06/2021. - Pathology shows clear-cell RCC, grade 4, 12.5 cm, necrosis and rhabdoid features are identified.  Tumor extends into the and invades the wall of the vena cava (pT3c).  Vascular, ureteral and all margins of resection are negative for tumor. - CT scan abdomen with and without contrast on 05/29/2021 showed several nodules along the peritoneal surface of the right nephrectomy bed warranting close attention. - CTAP with and without contrast on 09/18/2021 showed progressive nodularity within the right nephrectomy bed, with enlarged nodules adjacent to the right hepatic lobe extending inferiorly along the right retroperitoneum with multiple enlarged enhancing nodules.  Direct metastatic invasion into the right hepatic lobe.  Small nodule at the right lung base favored to be benign. - IMDC: Intermediate risk group with 2 features including diagnosis less than 1 year and hemoglobin lower than normal. - Bone scan on 10/05/2021 with focal activity in the mental vertex of the mandible which could be inflammatory/traumatic/metastatic. - CT chest on 10/03/2021 showed several lung nodules scattered bilaterally some of which could be metastatic and many others could be due to mucoid impaction. - NGS testing did not show any targetable mutations.  TMB was low.  CCND3 amplified.  PBRM1 VUS present. - Right nephrectomy bed biopsy (10/22/2021): Clear-cell renal cell carcinoma with necrosis - 4 cycles of ipilimumab and nivolumab from 11/19/2021 through 01/24/2022 - CT CAP on 02/14/2022: Numerous new and enlarged lung nodules.  Significant interval increase in  the mass in the right renal bed.  New hypodense liver lesions. - Lenvatinib (12 mg) and everolimus 5 mg daily started on 03/13/2022    Social/family history: - She lives at home with her husband and also takes care of her mother. - She works as a Buyer, retail at [employer].  She quit smoking in 2004 and smoked half pack per day for 10 years. - Half sister (same mother) had kidney Bush at age 64.  Maternal grandfather had kidney Bush.  Sister died of lung Bush.  2 half sisters (same father) had breast cancers.  3.  Left breast stage I tubular adenocarcinoma: - She underwent left mastectomy on 06/11/2002.  0/6 lymph nodes positive.  ER 90%, PR 92%, Ki-67 6%, HER2 negative.  As per chart, declined antiestrogen therapy.   PLAN:  Metastatic clear-cell RCC, intermediate risk by IMDC: - CT CAP (09/09/2022): Stable multifocal liver mets, stable to mild  decrease of the lesions in the right retroperitoneum and right pericolic gutter.  Stable enhancing mass within the posterior lateral abdominal wall.  Trace amount of ascites within the dependent portions of the pelvis. - Reviewed labs today which showed normal LFTs.  Creatinine mildly elevated at 1.09 and stable.  White count and platelet count was normal. - Recommend continuing lenvatinib 18 mg daily and everolimus 5 mg daily. - RTC 8 weeks for follow-up with repeat CT CAP and labs.  2.  Normocytic anemia: - Normocytic anemia from CKD, iron deficiency and myelosuppression. - She started taking iron tablet daily with stool softener at last visit on 09/17/2022. - Hemoglobin improved to 10.4 g and ferritin improved to 91 from 74. - Continue iron tablet daily.  3.  Hypothyroidism: - Continue Synthroid 112 mcg daily.  She will have TSH checked with Dr. Dorris Fetch.    Orders placed this encounter:  Orders Placed This Encounter  Procedures   CT CHEST ABDOMEN PELVIS W CONTRAST   CBC with Differential   Comprehensive metabolic panel    Magnesium   Ferritin   Iron and TIBC ([facility] ONLY)      Derek Jack, MD [place] [phone]

## 2022-10-29 NOTE — Progress Notes (Signed)
Patient is taking Everolimus and Lenvatinib as prescribed. She has not missed any doses and reports no side effects at this time.

## 2022-10-29 NOTE — Progress Notes (Signed)
Patients port flushed without difficulty.  Good blood return noted with no bruising or swelling noted at site.  Band aid applied.  VSS with discharge and left in satisfactory condition with no s/s of distress noted.   

## 2022-10-29 NOTE — Addendum Note (Signed)
Addended by: Fayrene Helper on: 10/29/2022 06:05 AM   Modules accepted: Orders

## 2022-10-29 NOTE — Patient Instructions (Addendum)
[facility]  Discharge Instructions  You were seen and examined today by Dr. Delton Coombes.  Continue your Everolimus and Lenvatinib as prescribed.  Your Hemoglobin is up by 1 point to 10.4. Continue iron as prescribed.  Follow-up as scheduled.  Thank you for choosing [facility] to provide your oncology and hematology care.   To afford each patient quality time with our provider, please arrive at least 15 minutes before your scheduled appointment time. You may need to reschedule your appointment if you arrive late (10 or more minutes). Arriving late affects you and other patients whose appointments are after yours.  Also, if you miss three or more appointments without notifying the office, you may be dismissed from the clinic at the provider's discretion.    Again, thank you for choosing [facility].  Our hope is that these requests will decrease the amount of time that you wait before being seen by our physicians.   If you have a lab appointment with the [facility] please come in thru the Main Entrance and check in at the main information desk.           _____________________________________________________________  Should you have questions after your visit to [facility], please contact our office at [phone] and follow the prompts.  Our office hours are 8:00 a.m. to 4:30 p.m. Monday - Thursday and 8:00 a.m. to 2:30 p.m. Friday.  Please note that voicemails left after 4:00 p.m. may not be returned until the following business day.  We are closed weekends and all major holidays.  You do have access to a nurse 24-7, just call the main number to the clinic [phone] and do not press any options, hold on the line and a nurse will answer the phone.    For prescription refill requests, have your pharmacy contact our office and allow 72 hours.    Masks are optional in the cancer centers. If you would like  for your care team to wear a mask while they are taking care of you, please let them know. You may have one support person who is at least 64 years old accompany you for your appointments.

## 2022-10-30 ENCOUNTER — Ambulatory Visit: Payer: No Typology Code available for payment source | Admitting: Urology

## 2022-10-30 ENCOUNTER — Encounter: Payer: Self-pay | Admitting: Urology

## 2022-10-30 VITALS — BP 153/69 | HR 69

## 2022-10-30 DIAGNOSIS — C641 Malignant neoplasm of right kidney, except renal pelvis: Secondary | ICD-10-CM

## 2022-10-30 MED ORDER — METRONIDAZOLE 0.75 % VA GEL: 1.0000 | Freq: Every day | VAGINAL | 0 refills | Status: DC

## 2022-10-30 MED ORDER — EZETIMIBE 10 MG PO TABS: 10.0000 mg | ORAL_TABLET | Freq: Every day | ORAL | 3 refills | Status: DC

## 2022-10-30 NOTE — Addendum Note (Signed)
Addended by: Tula Nakayama E on: 10/30/2022 01:05 PM   Modules accepted: Orders

## 2022-10-30 NOTE — Progress Notes (Signed)
10/30/2022 3:24 PM   [name] Mar 05, 1961 [id number]  Referring provider: Fayrene Helper, MD [address] [place],  [place] [address]  Followup metastatic RCC   HPI: Ms Eckley is a 62yo here for followup for metastatic RCC. She remains on Everolimus and Lenvatinib. Hemoglobin 10.4. Energy is good. No significant LUTS. 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    Cellulitis of leg, left    Complication of anesthesia    Hard to wake up   Diabetes mellitus without complication ([place])    Pt denies   Family history of breast cancer 08/16/2011   Family history of breast cancer    Family history of colon cancer    Family history of kidney cancer    Family history of prostate cancer    Hyperlipidemia    Hypertension    Hypothyroidism    MRSA (methicillin resistant staph aureus) culture positive 08/19/2011   Pre-diabetes     Surgical History: Past Surgical History:  Procedure Laterality Date   BREAST SURGERY Left    mastectomy   CESAREAN SECTION     x2   COLONOSCOPY N/A 03/03/2020   Procedure: COLONOSCOPY;  Surgeon: Daneil Dolin, MD;  [facility]: [facility];  Service: Endoscopy;  Laterality: N/A;  12:00   IR IMAGING GUIDED PORT INSERTION  11/07/2021   left mastectomy     NEPHRECTOMY Right 02/06/2021   Procedure: NEPHRECTOMY- open radical;  Surgeon: Cleon Gustin, MD;  Location: [facility];  Service: Urology;  Laterality: Right;   POLYPECTOMY  03/03/2020   Procedure: POLYPECTOMY;  Surgeon: Daneil Dolin, MD;  [facility]: [facility];  Service: Endoscopy;;    Home Medications:  Allergies as of 10/30/2022       Reactions   Sulfonamide Derivatives Itching   Yellow Jacket Venom [bee Venom] Rash   urticaria        Medication List        Accurate as of October 30, 2022  3:24 PM. If you have any questions, ask your nurse or doctor.          acetaminophen 500 MG tablet Commonly known  as: TYLENOL Take 500 mg by mouth every 6 (six) hours as needed for moderate pain or headache.   amLODipine 5 MG tablet Commonly known as: NORVASC TAKE 1 TABLET BY MOUTH DAILY.   atorvastatin 10 MG tablet Commonly known as: LIPITOR TAKE ONE TABLET BY MOUTH ONCE DAILY   EPINEPHrine 0.3 mg/0.3 mL Soaj injection Commonly known as: EPI-PEN Inject 0.3 mg into the muscle as needed for anaphylaxis.   everolimus 5 MG tablet Commonly known as: AFINITOR Take 1 tablet (5 mg total) by mouth daily.   ezetimibe 10 MG tablet Commonly known as: Zetia Take 1 tablet (10 mg total) by mouth daily.   Iron (Ferrous Sulfate) 325 (65 Fe) MG Tabs Take 325 mg by mouth daily.   lenvatinib 18 mg daily dose 10 MG & 2 x 4 MG capsule Commonly known as: LENVIMA Take 18 mg by mouth daily.   levothyroxine 112 MCG tablet Commonly known as: SYNTHROID Take 1 tablet (112 mcg total) by mouth daily before breakfast.   levothyroxine 112 MCG tablet Commonly known as: SYNTHROID Take 1 tablet (112 mcg total) by mouth daily.   metroNIDAZOLE 0.75 % vaginal gel Commonly known as: METROGEL Place 1 Applicatorful vaginally at bedtime.   mirtazapine 7.5 MG  tablet Commonly known as: REMERON Take 1 tablet (7.5 mg total) by mouth at bedtime.   multivitamin with minerals Tabs tablet Take 1 tablet by mouth daily.   prochlorperazine 10 MG tablet Commonly known as: COMPAZINE TAKE 1 TABLET (10 MG TOTAL) BY MOUTH EVERY 6 (SIX) HOURS AS NEEDED FOR NAUSEA OR VOMITING.   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   urea 10 % cream Commonly known as: CARMOL Apply topically 2 (two) times daily. Apply twice daily to hands        Allergies:  Allergies  Allergen Reactions   Sulfonamide Derivatives Itching   Yellow Jacket Venom [Bee Venom] Rash    urticaria    Family History: Family History  Problem Relation Age of Onset   Hypertension Mother     Diabetes Mother    Hyperlipidemia Mother    Colon cancer Maternal Aunt        dx 2s   Prostate cancer Maternal Uncle    Throat cancer Maternal Uncle    Kidney cancer Maternal Grandfather    Lung cancer Half-Sister    Kidney cancer Half-Sister 77   Breast cancer Half-Sister 84   Breast cancer Half-Sister 66    Social History:  reports that she quit smoking about 20 years ago. Her smoking use included cigarettes. She has a 9.00 pack-year smoking history. She has never used smokeless tobacco. She reports that she does not drink alcohol and does not use drugs.  ROS: All other review of systems were reviewed and are negative except what is noted above in HPI  Physical Exam: BP (!) 153/69   Pulse 69   Constitutional:  Alert and oriented, No acute distress. HEENT: [place] AT, moist mucus membranes.  Trachea midline, no masses. Cardiovascular: No clubbing, cyanosis, or edema. Respiratory: Normal respiratory effort, no increased work of breathing. GI: Abdomen is soft, nontender, nondistended, no abdominal masses GU: No CVA tenderness.  Lymph: No cervical or inguinal lymphadenopathy. Skin: No rashes, bruises or suspicious lesions. Neurologic: Grossly intact, no focal deficits, moving all 4 extremities. Psychiatric: Normal mood and affect.  Laboratory Data: Lab Results  Component Value Date   WBC 4.9 10/29/2022   HGB 10.4 (L) 10/29/2022   HCT 32.5 (L) 10/29/2022   MCV 88.8 10/29/2022   PLT 192 10/29/2022    Lab Results  Component Value Date   CREATININE 1.09 (H) 10/29/2022    No results found for: "PSA"  No results found for: "TESTOSTERONE"  Lab Results  Component Value Date   HGBA1C 6.3 07/23/2022    Urinalysis    Component Value Date/Time   COLORURINE RED (A) 12/23/2020 1939   APPEARANCEUR Clear 05/01/2022 1545   LABSPEC 1.002 (L) 08/25/2020 2020   PHURINE 7.0 08/25/2020 2020   GLUCOSEU Negative 05/01/2022 1545   HGBUR (A) 12/23/2020 1939    TEST NOT REPORTED DUE  TO COLOR INTERFERENCE OF URINE PIGMENT   BILIRUBINUR Negative 05/01/2022 1545   KETONESUR (A) 12/23/2020 1939    TEST NOT REPORTED DUE TO COLOR INTERFERENCE OF URINE PIGMENT   PROTEINUR Negative 05/01/2022 1545   PROTEINUR (A) 12/23/2020 1939    TEST NOT REPORTED DUE TO COLOR INTERFERENCE OF URINE PIGMENT   UROBILINOGEN 0.2 04/18/2021 0855   NITRITE Negative 05/01/2022 1545   NITRITE (A) 12/23/2020 1939    TEST NOT REPORTED DUE TO COLOR INTERFERENCE OF URINE PIGMENT   LEUKOCYTESUR Negative 05/01/2022 1545   LEUKOCYTESUR (A)  12/23/2020 1939    TEST NOT REPORTED DUE TO COLOR INTERFERENCE OF URINE PIGMENT    Lab Results  Component Value Date   LABMICR 24.6 06/25/2022   WBCUA 0-5 05/01/2022   LABEPIT 0-10 05/01/2022   BACTERIA Moderate (A) 05/01/2022    Pertinent Imaging: CT 09/09/2022: Images reviewed and discussed with the patient No results found for this or any previous visit.  No results found for this or any previous visit.  No results found for this or any previous visit.  No results found for this or any previous visit.  No results found for this or any previous visit.  No valid procedures specified. No results found for this or any previous visit.  Results for orders placed during the hospital encounter of 12/23/20  CT RENAL STONE STUDY  Narrative CLINICAL DATA:  62 year old female with flank pain. Concern for kidney stone.  EXAM: CT ABDOMEN AND PELVIS WITHOUT CONTRAST  TECHNIQUE: Multidetector CT imaging of the abdomen and pelvis was performed following the standard protocol without IV contrast.  COMPARISON:  None.  FINDINGS: Evaluation of this exam is limited in the absence of intravenous contrast.  Lower chest: Bibasilar linear atelectasis/scarring. There is a 3 mm right lower lobe nodule (8/4). The visualized lung bases are otherwise clear. There is hypoattenuation of the cardiac blood pool suggestive of anemia. Clinical correlation is  recommended.  No intra-abdominal free air or free fluid.  Hepatobiliary: Apparent infiltration of the right renal mass into the posterior right lobe of the liver (segment VII). The liver is otherwise unremarkable. No intrahepatic biliary dilatation. No calcified gallstone or pericholecystic fluid.  Pancreas: Unremarkable. No pancreatic ductal dilatation or surrounding inflammatory changes.  Spleen: Normal in size without focal abnormality.  Adrenals/Urinary Tract: The adrenal glands unremarkable. There is a solid predominantly hypoattenuating right renal upper pole mass measuring approximately 6 x 9 cm in greatest axial dimensions and 11 cm in craniocaudal length most consistent with malignancy. Further characterization with renal mass protocol MRI on a nonemergent/outpatient basis recommended. There is superior extension of the mass with apparent invasion into the right lobe of the liver. There is probable extension of the mass into the right renal pelvis with a degree of obstruction and mild right hydronephrosis. There is no hydronephrosis or nephrolithiasis on the left. The visualized ureters and urinary bladder appear unremarkable.  Stomach/Bowel: There is moderate stool throughout the colon. No bowel obstruction or active inflammation. The appendix is normal.  Vascular/Lymphatic: Mild aortoiliac atherosclerotic disease. The IVC is unremarkable. Evaluation for possible tumor thrombus is limited on this noncontrast CT. CT with IV contrast may provide better evaluation. No portal venous gas. No adenopathy.  Reproductive: The uterus is anteverted and grossly unremarkable. No adnexal masses.  Other: Left mastectomy with partially visualized prosthesis.  Musculoskeletal: Degenerative changes of the spine. No acute osseous pathology.  IMPRESSION: 1. Large right renal upper pole mass most consistent with malignancy. CT with IV contrast may provide better evaluation  of potential tumor thrombus. 2. Mild right hydronephrosis secondary to extension of the mass into the right renal pelvis. 3. No bowel obstruction. Normal appendix. 4. A 3 mm right lower lobe nodule. 5. Aortic Atherosclerosis (ICD10-I70.0).   Electronically Signed By: Anner Crete M.D. On: 12/24/2020 00:49   Assessment & Plan:    1. Renal cell carcinoma of right kidney (HCC) -management per Dr. Delton Coombes  -followup 6 months - Urinalysis, Routine w reflex microscopic   No follow-ups on file.  Nicolette Bang, MD  [facility]

## 2022-10-30 NOTE — Patient Instructions (Signed)
Kidney Cancer  Kidney cancer is a type of cancer in which an abnormal growth of cells (tumor) forms in one or both kidneys. The kidneys filter waste from your blood and make urine. Kidney cancer may spread to other parts of your body. This type of cancer may also be called renal cell carcinoma. What are the causes? The cause of this condition is not known. What increases the risk? You may be more likely to develop kidney cancer if: You are over age 60. You have certain conditions that are passed from parent to child (inherited). These include von Hippel-Lindau disease, tuberous sclerosis, and hereditary papillary renal carcinoma. You smoke or have been exposed to certain chemicals. You have advanced kidney disease, especially if you need to use a machine to clean your blood (dialysis). You are obese. You have high blood pressure (hypertension). You are female. What are the signs or symptoms? At first, kidney cancer may not cause any symptoms. As the cancer grows, symptoms may include: Blood in the urine. Pain in the upper back or abdomen, just below the rib cage. You may feel pain on one or both sides of your body. Tiredness (fatigue). Weight loss that cannot be explained. Fever. How is this diagnosed? This condition may be diagnosed based on: Your symptoms. Your medical history. A physical exam. You may also have tests, such as: Blood and urine tests. X-rays. Imaging tests, such as CT scans, MRIs, and PET scans. Angiogram. This is when dye is injected into your blood to show your blood vessels. Intravenous pyelogram. This is when dye is injected into your blood to show your kidneys and the other organs that help with making and storing urine. Biopsy. This is when a piece of tissue is removed from your kidney and looked at under a microscope. Your cancer will be assessed (staged), based on how severe it is and how much it has spread. How is this treated? Treatment depends on the type  and stage of the cancer. Treatment may include: Surgery to remove: Just the tumor (nephron-sparing surgery). The entire kidney (nephrectomy). The kidney, some of the healthy tissue around it, nearby lymph nodes, and sometimes the adrenal gland (radical nephrectomy). Medicines to: Kill cancer cells (chemotherapy). Help your body's disease-fighting system (immune system) fight cancer cells. This is known as immunotherapy. Radiation therapy. This uses high-energy rays to kill cancer cells. Targeted therapy to only attack genes and proteins that allow a tumor to grow. This type of therapy tries to limit damage to your healthy cells. Cryoablation. This uses gas or liquid to freeze cancer cells. It is sent through a needle. Radiofrequency ablation. This uses high-energy radio waves to destroy cancer cells. It is sent through a needle-like probe. Embolization. This is a procedure to block the artery that supplies blood to the tumor. Follow these instructions at home: Eating and drinking Some of your treatments may affect your appetite and your ability to chew and swallow. If you have problems eating, or if you do not have an appetite, meet with an expert in diet and nutrition (dietitian). If you have side effects that affect eating, it may help to: Eat smaller meals more often. Eat bland or soft foods. Avoid foods that are hot, spicy, or hard to swallow. Drink shakes or supplements that are high in nutrition and calories. Do not drink alcohol. Lifestyle Do not use any products that contain nicotine or tobacco. These products include cigarettes, chewing tobacco, and vaping devices, such as e-cigarettes. If you need   help quitting, ask your health care provider. Get enough sleep. Most adults need 6-8 hours of sleep each night. During treatment, you may need more sleep. General instructions Take over-the-counter and prescription medicines only as told by your health care provider. Consider joining a  support group. This can help you cope with the stress of having kidney cancer. Work with your health care provider to manage any side effects of treatment. Keep all follow-up visits. Your health care provider will want to make sure your treatment is working. Where to find more information American Cancer Society: cancer.org National Cancer Institute (NCI): cancer.gov Contact a health care provider if: You bruise or bleed easily. You lose weight without trying. You have new or worse fatigue or weakness. You have a fever. Your pain suddenly increases. You have more blood in your urine. Your skin or the white parts of your eyes turn yellow (jaundice). Get help right away if: You have chest pain. You are short of breath. You have irregular heartbeats (palpitations). These symptoms may be an emergency. Get help right away. Call 911. Do not wait to see if the symptoms will go away. Do not drive yourself to the hospital. This information is not intended to replace advice given to you by your health care provider. Make sure you discuss any questions you have with your health care provider. Document Revised: 03/05/2022 Document Reviewed: 03/05/2022 Elsevier Patient Education  2023 Elsevier Inc.  

## 2022-10-31 LAB — MICROSCOPIC EXAMINATION

## 2022-10-31 LAB — URINALYSIS, ROUTINE W REFLEX MICROSCOPIC
Bilirubin, UA: NEGATIVE
Glucose, UA: NEGATIVE
Ketones, UA: NEGATIVE
Leukocytes,UA: NEGATIVE
Nitrite, UA: NEGATIVE
Specific Gravity, UA: 1.02 (ref 1.005–1.030)
Urobilinogen, Ur: 1 mg/dL (ref 0.2–1.0)
pH, UA: 6 (ref 5.0–7.5)

## 2022-10-31 LAB — CYTOLOGY - PAP
Comment: NEGATIVE
Comment: NEGATIVE
Comment: NEGATIVE
Diagnosis: UNDETERMINED — AB
HPV 16: NEGATIVE
HPV 18 / 45: NEGATIVE
High risk HPV: POSITIVE — AB

## 2022-11-04 NOTE — Addendum Note (Signed)
Addended by: Fayrene Helper on: 11/04/2022 08:48 AM   Modules accepted: Orders

## 2022-11-18 NOTE — Telephone Encounter (Signed)
Called to check status of application. Informed by representative it was approved on 01.23.24. I requested approval letter be faxed to me at [id number] so I can add to patient chart.   [name], [facility] Patient [place]  [phone] (phone) [phone] (fax) 11/18/2022 8:48 AM

## 2022-11-18 NOTE — Telephone Encounter (Signed)
Oral Oncology Patient Advocate Encounter   Received notification re-enrollment for assistance for Afinitor through [employer] has been approved. Patient may continue to receive their medication at $0 from this program.    [employer]' phone number [phone].   Effective dates: 10/29/22 through 10/07/23  I have spoken to the patient.  [name], [facility] Patient [place]  [phone] (phone) [phone] (fax) 11/18/2022 9:02 AM

## 2022-11-28 ENCOUNTER — Other Ambulatory Visit: Payer: Self-pay | Admitting: *Deleted

## 2022-11-28 ENCOUNTER — Encounter: Payer: Self-pay | Admitting: Family Medicine

## 2022-11-28 ENCOUNTER — Ambulatory Visit (INDEPENDENT_AMBULATORY_CARE_PROVIDER_SITE_OTHER): Payer: No Typology Code available for payment source | Admitting: Family Medicine

## 2022-11-28 VITALS — BP 172/84

## 2022-11-28 DIAGNOSIS — C641 Malignant neoplasm of right kidney, except renal pelvis: Secondary | ICD-10-CM

## 2022-11-28 DIAGNOSIS — F320 Major depressive disorder, single episode, mild: Secondary | ICD-10-CM

## 2022-11-28 DIAGNOSIS — I10 Essential (primary) hypertension: Secondary | ICD-10-CM | POA: Diagnosis not present

## 2022-11-28 DIAGNOSIS — E782 Mixed hyperlipidemia: Secondary | ICD-10-CM

## 2022-11-28 NOTE — Progress Notes (Signed)
   Deanna Bush     MRN: [id number]      DOB: 09-01-1961   HPI Deanna Bush is here for follow up and re-evaluation of chronic medical conditions, I particular uncontrolled hTN aggravated by chemo, getting readings over [id number] now [id number] systolic. medication management and review of any available recent lab and radiology data.  Questions or concerns regarding consultations or procedures which the PT has had in the interim are  addressed. The PT denies any adverse reactions to current medications since the last visit.  There are no new concerns.  There are no specific complaints   ROS Denies recent fever or chills. Denies sinus pressure, nasal congestion, ear pain or sore throat. Denies chest congestion, productive cough or wheezing. Denies chest pains, palpitations and leg swelling Denies abdominal pain, nausea, vomiting,diarrhea or constipation.   Denies dysuria, frequency, hesitancy or incontinence. Denies joint pain, swelling and limitation in mobility. Denies headaches, seizures, numbness, or tingling. Denies uncontrolled depression, anxiety or insomnia. Denies skin break down or rash.   PE  BP (!) 172/84   Patient alert and oriented and in no cardiopulmonary distress.  HEENT: No facial asymmetry, EOMI,     Neck supple .  Chest: Clear to auscultation bilaterally.  CVS: S1, S2 no murmurs, no S3.Regular rate.  ABD: Soft non tender.   Ext: No edema  MS: Adequate ROM spine, shoulders, hips and knees.  Skin: Intact, no ulcerations or rash noted.  Psych: Good eye contact, normal affect. Memory intact not anxious or depressed appearing.  CNS: CN 2-12 intact, power,  normal throughout.no focal deficits noted.   Assessment & Plan  Primary hypertension Uncontrolled , inc amlodipine to 10 mg daily , f/u as before DASH diet and commitment to daily physical activity for a minimum of 30 minutes discussed and encouraged, as a part of hypertension management. The importance of  attaining a healthy weight is also discussed.     11/28/2022    4:49 PM 11/28/2022    4:46 PM 10/30/2022    2:45 PM 10/29/2022    1:47 PM 10/25/2022    8:30 AM 10/25/2022    8:27 AM 09/17/2022    2:26 PM  BP/Weight  Systolic BP Q000111Q 0000000 0000000 Q000111Q Q000111Q Q000111Q [id number]  Diastolic BP 84 82 69 82 82 80 81  Wt. (Lbs)    168.4  164 168.8  BMI    27.18 kg/m2  26.47 kg/m2 27.25 kg/m2       Depression, major, single episode, mild (HCC) Improved apetite and sleep on remeron continue same takes on avg 3 days/ week  Mixed hyperlipidemia Hyperlipidemia:Low fat diet discussed and encouraged.   Lipid Panel  Lab Results  Component Value Date   CHOL 254 (H) 10/14/2022   HDL 69 10/14/2022   LDLCALC 165 (H) 10/14/2022   TRIG 113 10/14/2022   CHOLHDL 3.7 10/14/2022     Take zetia , stop statin  Renal cell cancer [facility]) Active treatment and close f/u by Oncology

## 2022-11-28 NOTE — Assessment & Plan Note (Signed)
Improved apetite and sleep on remeron continue same takes on avg 3 days/ week

## 2022-11-28 NOTE — Patient Instructions (Signed)
Keep March appointment re evaluate blood pressure  Increase amlodipine to 5 mg TWO every day  Thanks for choosing [facility], we consider it a privelige to serve you.

## 2022-11-28 NOTE — Assessment & Plan Note (Signed)
Uncontrolled , inc amlodipine to 10 mg daily , f/u as before DASH diet and commitment to daily physical activity for a minimum of 30 minutes discussed and encouraged, as a part of hypertension management. The importance of attaining a healthy weight is also discussed.     11/28/2022    4:49 PM 11/28/2022    4:46 PM 10/30/2022    2:45 PM 10/29/2022    1:47 PM 10/25/2022    8:30 AM 10/25/2022    8:27 AM 09/17/2022    2:26 PM  BP/Weight  Systolic BP Q000111Q 0000000 0000000 Q000111Q Q000111Q Q000111Q [id number]  Diastolic BP 84 82 69 82 82 80 81  Wt. (Lbs)    168.4  164 168.8  BMI    27.18 kg/m2  26.47 kg/m2 27.25 kg/m2

## 2022-11-28 NOTE — Assessment & Plan Note (Signed)
Hyperlipidemia:Low fat diet discussed and encouraged.   Lipid Panel  Lab Results  Component Value Date   CHOL 254 (H) 10/14/2022   HDL 69 10/14/2022   LDLCALC 165 (H) 10/14/2022   TRIG 113 10/14/2022   CHOLHDL 3.7 10/14/2022     Take zetia , stop statin

## 2022-11-28 NOTE — Assessment & Plan Note (Signed)
Active treatment and close f/u by Oncology

## 2022-12-05 ENCOUNTER — Encounter: Payer: Self-pay | Admitting: Radiology

## 2022-12-10 ENCOUNTER — Ambulatory Visit (INDEPENDENT_AMBULATORY_CARE_PROVIDER_SITE_OTHER): Payer: No Typology Code available for payment source | Admitting: Psychiatry

## 2022-12-10 ENCOUNTER — Encounter (HOSPITAL_COMMUNITY): Payer: Self-pay | Admitting: Psychiatry

## 2022-12-10 DIAGNOSIS — F4323 Adjustment disorder with mixed anxiety and depressed mood: Secondary | ICD-10-CM | POA: Diagnosis not present

## 2022-12-11 ENCOUNTER — Encounter (HOSPITAL_COMMUNITY): Payer: Self-pay | Admitting: Psychiatry
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
## 2022-12-18 ENCOUNTER — Other Ambulatory Visit: Payer: Self-pay

## 2022-12-18 ENCOUNTER — Other Ambulatory Visit: Payer: Self-pay | Admitting: *Deleted

## 2022-12-18 ENCOUNTER — Other Ambulatory Visit (HOSPITAL_COMMUNITY): Payer: Self-pay | Admitting: Hematology

## 2022-12-18 DIAGNOSIS — C641 Malignant neoplasm of right kidney, except renal pelvis: Secondary | ICD-10-CM

## 2022-12-18 MED ORDER — LENVATINIB (18 MG DAILY DOSE) 10 MG & 2 X 4 MG PO CPPK: ORAL_CAPSULE | ORAL | 1 refills | Status: DC

## 2022-12-18 NOTE — Telephone Encounter (Signed)
Refill for Lenvima approved.  Patient is tolerating and is to continue therapy.

## 2022-12-18 NOTE — Telephone Encounter (Signed)
Chart reviewed. Lenvatinib refilled per Dr. Delton Coombes last office note.

## 2022-12-19 ENCOUNTER — Encounter: Payer: Self-pay | Admitting: Family Medicine

## 2022-12-19 ENCOUNTER — Ambulatory Visit: Payer: No Typology Code available for payment source | Admitting: Family Medicine

## 2022-12-19 VITALS — BP 136/74 | HR 71 | Ht 66.0 in | Wt 167.0 lb

## 2022-12-19 DIAGNOSIS — R600 Localized edema: Secondary | ICD-10-CM | POA: Diagnosis not present

## 2022-12-19 DIAGNOSIS — E890 Postprocedural hypothyroidism: Secondary | ICD-10-CM | POA: Diagnosis not present

## 2022-12-19 DIAGNOSIS — E782 Mixed hyperlipidemia: Secondary | ICD-10-CM

## 2022-12-19 DIAGNOSIS — E119 Type 2 diabetes mellitus without complications: Secondary | ICD-10-CM | POA: Diagnosis not present

## 2022-12-19 DIAGNOSIS — C641 Malignant neoplasm of right kidney, except renal pelvis: Secondary | ICD-10-CM

## 2022-12-19 DIAGNOSIS — I10 Essential (primary) hypertension: Secondary | ICD-10-CM | POA: Diagnosis not present

## 2022-12-19 DIAGNOSIS — F320 Major depressive disorder, single episode, mild: Secondary | ICD-10-CM

## 2022-12-19 MED ORDER — SPIRONOLACTONE 25 MG PO TABS: 25.0000 mg | ORAL_TABLET | Freq: Every day | ORAL | 2 refills | Status: DC

## 2022-12-19 NOTE — Progress Notes (Signed)
Deanna Bush     MRN: [id number]      DOB: Aug 04, 1961   HPI Deanna Bush is here for follow up and re-evaluation of chronic medical conditions, in particular uncontrolled hypertension, medication management and review of any available recent lab and radiology data.  Preventive health is updated, specifically  Cancer screening and Immunization.   Questions or concerns regarding consultations or procedures which the PT has had in the interim are  addressed. C/o Significant leg swelling since starting amlodipine , also supraorbital  and abdominal swelling, has been doubling pillows x 2 to 3 weeks, denies PND or poor exercise tolerance, no chest pain or tightness    ROS Denies recent fever or chills. Denies sinus pressure, nasal congestion, ear pain or sore throat. Denies chest congestion, productive cough or wheezing. Denies abdominal pain, nausea, vomiting,diarrhea or constipation.   Denies dysuria, frequency, hesitancy or incontinence. Denies joint pain, swelling and limitation in mobility. Denies headaches, seizures, numbness, or tingling. Denies uncontrolled  depression, anxiety or insomnia.In therapy which she is grateful for as conditions in her marriage continue to deteriorate Denies skin break down or rash.   PE  BP 136/74   Pulse 71   Ht 5\' 6"  (1.676 m)   Wt 167 lb (75.8 kg)   SpO2 97%   BMI 26.95 kg/m   Patient alert and oriented and in no cardiopulmonary distress.  HEENT: No facial asymmetry, EOMI,     Neck supple .  Chest: Clear to auscultation bilaterally.  CVS: S1, S2 no murmurs, no S3.Regular rate.  ABD: Soft non tender.   Ext: Bilateral leg  edema  MS: Adequate ROM spine, shoulders, hips and knees.  Skin: Intact, no ulcerations or rash noted.  Psych: Good eye contact, normal affect. Memory intact not anxious or depressed appearing.  CNS: CN 2-12 intact, power,  normal throughout.no focal deficits noted.   Assessment & Plan  Primary hypertension Not  at goal and swelling with amlodipine, may be multifactorial EKG in office , non specific t wave abn, new dx of HTN and possible symptoms of CHF, refer cardiology Change to spironolactone and re eval in 4 to 6 weeks DASH diet and commitment to daily physical activity for a minimum of 30 minutes discussed and encouraged, as a part of hypertension management. The importance of attaining a healthy weight is also discussed.     12/19/2022    8:58 AM 12/19/2022    8:39 AM 12/19/2022    8:37 AM 11/28/2022    4:49 PM 11/28/2022    4:46 PM 10/30/2022    2:45 PM 10/29/2022    1:47 PM  BP/Weight  Systolic BP XX123456 0000000 Q000111Q Q000111Q 0000000 0000000 Q000111Q  Diastolic BP 74 72 73 84 82 69 82  Wt. (Lbs)   167    168.4  BMI   26.95 kg/m2    27.18 kg/m2       Type 2 diabetes, HbA1c goal < 7% (HCC) Deanna Bush is reminded of the importance of commitment to daily physical activity for 30 minutes or more, as able and the need to limit carbohydrate intake to 30 to 60 grams per meal to help with blood sugar control.   Deanna Bush is reminded of the importance of daily foot exam, annual eye examination, and good blood sugar, blood pressure and cholesterol control. Updated lab needed at/ before next visit.      Latest Ref Rng & Units 10/29/2022    1:58 PM 10/14/2022  12:12 PM 09/11/2022    2:14 PM 08/07/2022    1:48 PM 07/23/2022    8:30 AM  Diabetic Labs  HbA1c 0.0 - 7.0 %     6.3   Chol 100 - 199 mg/dL  254      HDL >39 mg/dL  69      Calc LDL 0 - 99 mg/dL  165      Triglycerides 0 - 149 mg/dL  113      Creatinine 0.44 - 1.00 mg/dL 1.09  1.07  1.08  1.13        12/19/2022    8:58 AM 12/19/2022    8:39 AM 12/19/2022    8:37 AM 11/28/2022    4:49 PM 11/28/2022    4:46 PM 10/30/2022    2:45 PM 10/29/2022    1:47 PM  BP/Weight  Systolic BP XX123456 0000000 Q000111Q Q000111Q 0000000 0000000 Q000111Q  Diastolic BP 74 72 73 84 82 69 82  Wt. (Lbs)   167    168.4  BMI   26.95 kg/m2    27.18 kg/m2      04/18/2021    8:00 AM 10/29/2018    2:40 PM  Foot/eye  exam completion dates  Foot Form Completion Done Done        Hypothyroidism following radioiodine therapy Managed by Endo and has upcoming  appt  Mixed hyperlipidemia Hyperlipidemia:Low fat diet discussed and encouraged. Updated lab needed at/ before next visit.    Lipid Panel  Lab Results  Component Value Date   CHOL 254 (H) 10/14/2022   HDL 69 10/14/2022   LDLCALC 165 (H) 10/14/2022   TRIG 113 10/14/2022   CHOLHDL 3.7 10/14/2022       Depression, major, single episode, mild (HCC) Improved with therapy and on medication continue both  Renal cell cancer [facility]) Currently being treated by Oncology

## 2022-12-19 NOTE — Patient Instructions (Signed)
F/U  In 5  weeks , call if you need me sooner  STOP amlodipine contributing to leg swelling  New is spironolactone for your blood pressure  You are referred to Cardiology   EKG in office today  Thanks for choosing [facility], we consider it a privelige to serve you.

## 2022-12-22 NOTE — Assessment & Plan Note (Addendum)
Hyperlipidemia:Low fat diet discussed and encouraged. Updated lab needed at/ before next visit.    Lipid Panel  Lab Results  Component Value Date   CHOL 254 (H) 10/14/2022   HDL 69 10/14/2022   LDLCALC 165 (H) 10/14/2022   TRIG 113 10/14/2022   CHOLHDL 3.7 10/14/2022

## 2022-12-22 NOTE — Assessment & Plan Note (Addendum)
Deanna Bush is reminded of the importance of commitment to daily physical activity for 30 minutes or more, as able and the need to limit carbohydrate intake to 30 to 60 grams per meal to help with blood sugar control.   Deanna Bush is reminded of the importance of daily foot exam, annual eye examination, and good blood sugar, blood pressure and cholesterol control. Updated lab needed at/ before next visit.      Latest Ref Rng & Units 10/29/2022    1:58 PM 10/14/2022   12:12 PM 09/11/2022    2:14 PM 08/07/2022    1:48 PM 07/23/2022    8:30 AM  Diabetic Labs  HbA1c 0.0 - 7.0 %     6.3   Chol 100 - 199 mg/dL  254      HDL >39 mg/dL  69      Calc LDL 0 - 99 mg/dL  165      Triglycerides 0 - 149 mg/dL  113      Creatinine 0.44 - 1.00 mg/dL 1.09  1.07  1.08  1.13        12/19/2022    8:58 AM 12/19/2022    8:39 AM 12/19/2022    8:37 AM 11/28/2022    4:49 PM 11/28/2022    4:46 PM 10/30/2022    2:45 PM 10/29/2022    1:47 PM  BP/Weight  Systolic BP XX123456 0000000 Q000111Q Q000111Q 0000000 0000000 Q000111Q  Diastolic BP 74 72 73 84 82 69 82  Wt. (Lbs)   167    168.4  BMI   26.95 kg/m2    27.18 kg/m2      04/18/2021    8:00 AM 10/29/2018    2:40 PM  Foot/eye exam completion dates  Foot Form Completion Done Done

## 2022-12-22 NOTE — Assessment & Plan Note (Signed)
Managed by Endo and has upcoming  appt

## 2022-12-22 NOTE — Assessment & Plan Note (Signed)
Improved with therapy and on medication continue both

## 2022-12-22 NOTE — Assessment & Plan Note (Signed)
Currently being treated by Oncology 

## 2022-12-22 NOTE — Assessment & Plan Note (Signed)
Not at goal and swelling with amlodipine, may be multifactorial EKG in office , non specific t wave abn, new dx of HTN and possible symptoms of CHF, refer cardiology Change to spironolactone and re eval in 4 to 6 weeks DASH diet and commitment to daily physical activity for a minimum of 30 minutes discussed and encouraged, as a part of hypertension management. The importance of attaining a healthy weight is also discussed.     12/19/2022    8:58 AM 12/19/2022    8:39 AM 12/19/2022    8:37 AM 11/28/2022    4:49 PM 11/28/2022    4:46 PM 10/30/2022    2:45 PM 10/29/2022    1:47 PM  BP/Weight  Systolic BP XX123456 0000000 Q000111Q Q000111Q 0000000 0000000 Q000111Q  Diastolic BP 74 72 73 84 82 69 82  Wt. (Lbs)   167    168.4  BMI   26.95 kg/m2    27.18 kg/m2

## 2022-12-23 ENCOUNTER — Ambulatory Visit (HOSPITAL_COMMUNITY): Payer: No Typology Code available for payment source

## 2022-12-23 ENCOUNTER — Inpatient Hospital Stay: Payer: No Typology Code available for payment source | Attending: Hematology

## 2022-12-23 DIAGNOSIS — E1122 Type 2 diabetes mellitus with diabetic chronic kidney disease: Secondary | ICD-10-CM | POA: Diagnosis not present

## 2022-12-23 DIAGNOSIS — D631 Anemia in chronic kidney disease: Secondary | ICD-10-CM | POA: Insufficient documentation

## 2022-12-23 DIAGNOSIS — C786 Secondary malignant neoplasm of retroperitoneum and peritoneum: Secondary | ICD-10-CM | POA: Diagnosis not present

## 2022-12-23 DIAGNOSIS — N189 Chronic kidney disease, unspecified: Secondary | ICD-10-CM | POA: Insufficient documentation

## 2022-12-23 DIAGNOSIS — C641 Malignant neoplasm of right kidney, except renal pelvis: Secondary | ICD-10-CM | POA: Insufficient documentation

## 2022-12-23 DIAGNOSIS — I129 Hypertensive chronic kidney disease with stage 1 through stage 4 chronic kidney disease, or unspecified chronic kidney disease: Secondary | ICD-10-CM | POA: Diagnosis not present

## 2022-12-23 DIAGNOSIS — D509 Iron deficiency anemia, unspecified: Secondary | ICD-10-CM | POA: Diagnosis not present

## 2022-12-23 LAB — CBC WITH DIFFERENTIAL/PLATELET
Abs Immature Granulocytes: 0.01 10*3/uL (ref 0.00–0.07)
Basophils Absolute: 0 10*3/uL (ref 0.0–0.1)
Basophils Relative: 0 %
Eosinophils Absolute: 0 10*3/uL (ref 0.0–0.5)
Eosinophils Relative: 1 %
HCT: 30.9 % — ABNORMAL LOW (ref 36.0–46.0)
Hemoglobin: 9.9 g/dL — ABNORMAL LOW (ref 12.0–15.0)
Immature Granulocytes: 0 %
Lymphocytes Relative: 38 %
Lymphs Abs: 1.6 10*3/uL (ref 0.7–4.0)
MCH: 28.4 pg (ref 26.0–34.0)
MCHC: 32 g/dL (ref 30.0–36.0)
MCV: 88.8 fL (ref 80.0–100.0)
Monocytes Absolute: 0.2 10*3/uL (ref 0.1–1.0)
Monocytes Relative: 5 %
Neutro Abs: 2.3 10*3/uL (ref 1.7–7.7)
Neutrophils Relative %: 56 %
Platelets: 193 10*3/uL (ref 150–400)
RBC: 3.48 MIL/uL — ABNORMAL LOW (ref 3.87–5.11)
RDW: 14.2 % (ref 11.5–15.5)
WBC: 4.1 10*3/uL (ref 4.0–10.5)
nRBC: 0 % (ref 0.0–0.2)

## 2022-12-23 LAB — IRON AND TIBC
Iron: 43 ug/dL (ref 28–170)
Saturation Ratios: 15 % (ref 10.4–31.8)
TIBC: 282 ug/dL (ref 250–450)
UIBC: 239 ug/dL

## 2022-12-23 LAB — COMPREHENSIVE METABOLIC PANEL
ALT: 22 U/L (ref 0–44)
AST: 31 U/L (ref 15–41)
Albumin: 3.1 g/dL — ABNORMAL LOW (ref 3.5–5.0)
Alkaline Phosphatase: 89 U/L (ref 38–126)
Anion gap: 7 (ref 5–15)
BUN: 12 mg/dL (ref 8–23)
CO2: 25 mmol/L (ref 22–32)
Calcium: 8.5 mg/dL — ABNORMAL LOW (ref 8.9–10.3)
Chloride: 103 mmol/L (ref 98–111)
Creatinine, Ser: 1.21 mg/dL — ABNORMAL HIGH (ref 0.44–1.00)
GFR, Estimated: 50 mL/min — ABNORMAL LOW (ref >60)
Glucose, Bld: 110 mg/dL — ABNORMAL HIGH (ref 70–99)
Potassium: 3.5 mmol/L (ref 3.5–5.1)
Sodium: 135 mmol/L (ref 135–145)
Total Bilirubin: 0.6 mg/dL (ref 0.3–1.2)
Total Protein: 6.7 g/dL (ref 6.5–8.1)

## 2022-12-23 LAB — FERRITIN: Ferritin: 92 ng/mL (ref 11–307)

## 2022-12-23 LAB — MAGNESIUM: Magnesium: 1.9 mg/dL (ref 1.7–2.4)

## 2022-12-23 MED ORDER — HEPARIN SOD (PORK) LOCK FLUSH 100 UNIT/ML IV SOLN
500.0000 [IU] | Freq: Once | INTRAVENOUS | Status: AC
  Administered 2022-12-23: 500 [IU] via INTRAVENOUS

## 2022-12-23 MED ORDER — SODIUM CHLORIDE 0.9% FLUSH
10.0000 mL | INTRAVENOUS | Status: DC | PRN
  Administered 2022-12-23: 10 mL via INTRAVENOUS

## 2022-12-23 NOTE — Patient Instructions (Signed)
[facility]  Discharge Instructions: Thank you for choosing [facility] to provide your oncology and hematology care.  If you have a lab appointment with the [facility], please come in thru the Main Entrance and check in at the main information desk.  Wear comfortable clothing and clothing appropriate for easy access to any Portacath or PICC line.   We strive to give you quality time with your provider. You may need to reschedule your appointment if you arrive late (15 or more minutes).  Arriving late affects you and other patients whose appointments are after yours.  Also, if you miss three or more appointments without notifying the office, you may be dismissed from the clinic at the provider's discretion.      For prescription refill requests, have your pharmacy contact our office and allow 72 hours for refills to be completed.    Today you received the following, port flush and labs drawn.    To help prevent nausea and vomiting after your treatment, we encourage you to take your nausea medication as directed.  BELOW ARE SYMPTOMS THAT SHOULD BE REPORTED IMMEDIATELY: *FEVER GREATER THAN 100.4 F (38 C) OR HIGHER *CHILLS OR SWEATING *NAUSEA AND VOMITING THAT IS NOT CONTROLLED WITH YOUR NAUSEA MEDICATION *UNUSUAL SHORTNESS OF BREATH *UNUSUAL BRUISING OR BLEEDING *URINARY PROBLEMS (pain or burning when urinating, or frequent urination) *BOWEL PROBLEMS (unusual diarrhea, constipation, pain near the anus) TENDERNESS IN MOUTH AND THROAT WITH OR WITHOUT PRESENCE OF ULCERS (sore throat, sores in mouth, or a toothache) UNUSUAL RASH, SWELLING OR PAIN  UNUSUAL VAGINAL DISCHARGE OR ITCHING   Items with * indicate a potential emergency and should be followed up as soon as possible or go to the Emergency Department if any problems should occur.  Please show the CHEMOTHERAPY ALERT CARD or IMMUNOTHERAPY ALERT CARD at check-in to the Emergency Department and triage  nurse.  Should you have questions after your visit or need to cancel or reschedule your appointment, please contact [facility] [phone]  and follow the prompts.  Office hours are 8:00 a.m. to 4:30 p.m. Monday - Friday. Please note that voicemails left after 4:00 p.m. may not be returned until the following business day.  We are closed weekends and major holidays. You have access to a nurse at all times for urgent questions. Please call the main number to the clinic [phone] and follow the prompts.  For any non-urgent questions, you may also contact your provider using MyChart. We now offer e-Visits for anyone 56 and older to request care online for non-urgent symptoms. For details visit [email or web address].   Also download the MyChart app! Go to the app store, search "MyChart", open the app, select [facility], and log in with your MyChart username and password.

## 2022-12-23 NOTE — Progress Notes (Signed)
Deanna Bush presented for Portacath access and flush. Portacath located right  chest wall accessed with  H 20 needle. Good blood return present. Portacath flushed with 30ml NS and 500U/24ml Heparin and needle removed intact. Procedure without incident. Patient tolerated procedure well.  Labs drawn per orders.

## 2022-12-30 ENCOUNTER — Ambulatory Visit: Payer: No Typology Code available for payment source | Admitting: Hematology

## 2023-01-01 ENCOUNTER — Ambulatory Visit (HOSPITAL_BASED_OUTPATIENT_CLINIC_OR_DEPARTMENT_OTHER)
Admission: RE | Admit: 2023-01-01 | Discharge: 2023-01-01 | Disposition: A | Discharge status: Home / Self Care | Payer: No Typology Code available for payment source | Source: Ambulatory Visit | Attending: Hematology | Admitting: Hematology

## 2023-01-01 DIAGNOSIS — C641 Malignant neoplasm of right kidney, except renal pelvis: Secondary | ICD-10-CM | POA: Diagnosis not present

## 2023-01-01 MED ORDER — IOHEXOL 300 MG/ML  SOLN
100.0000 mL | Freq: Once | INTRAMUSCULAR | Status: AC | PRN
  Administered 2023-01-01: 80 mL via INTRAVENOUS

## 2023-01-06 NOTE — Progress Notes (Signed)
[facility] [address], [place] [address]    Clinic Day:  01/07/2023  Referring physician: Fayrene Helper, MD  Patient Care Team: Fayrene Helper, MD as PCP - General Derek Jack, MD as Medical Oncologist (Medical Oncology)   ASSESSMENT & PLAN:   Assessment: 1. Metastatic clear-cell renal cell carcinoma: - Right radical nephrectomy on 02/06/2021. - Pathology shows clear-cell RCC, grade 4, 12.5 cm, necrosis and rhabdoid features are identified.  Tumor extends into the and invades the wall of the vena cava (pT3c).  Vascular, ureteral and all margins of resection are negative for tumor. - CT scan abdomen with and without contrast on 05/29/2021 showed several nodules along the peritoneal surface of the right nephrectomy bed warranting close attention. - CTAP with and without contrast on 09/18/2021 showed progressive nodularity within the right nephrectomy bed, with enlarged nodules adjacent to the right hepatic lobe extending inferiorly along the right retroperitoneum with multiple enlarged enhancing nodules.  Direct metastatic invasion into the right hepatic lobe.  Small nodule at the right lung base favored to be benign. - IMDC: Intermediate risk group with 2 features including diagnosis less than 1 year and hemoglobin lower than normal. - Bone scan on 10/05/2021 with focal activity in the mental vertex of the mandible which could be inflammatory/traumatic/metastatic. - CT chest on 10/03/2021 showed several lung nodules scattered bilaterally some of which could be metastatic and many others could be due to mucoid impaction. - NGS testing did not show any targetable mutations.  TMB was low.  CCND3 amplified.  PBRM1 VUS present. - Right nephrectomy bed biopsy (10/22/2021): Clear-cell renal cell carcinoma with necrosis - 4 cycles of ipilimumab and nivolumab from 11/19/2021 through 01/24/2022 - CT CAP on 02/14/2022: Numerous new and enlarged lung nodules.   Significant interval increase in the mass in the right renal bed.  New hypodense liver lesions. - Lenvatinib (12 mg) and everolimus 5 mg daily started on 03/13/2022   2. Social/family history: - She lives at home with her husband and also takes care of her mother. - She works as a Buyer, retail at [employer].  She quit smoking in 2004 and smoked half pack per day for 10 years. - Half sister (same mother) had kidney cancer at age 41.  Maternal grandfather had kidney cancer.  Sister died of lung cancer.  2 half sisters (same father) had breast cancers.  3.  Left breast stage I tubular adenocarcinoma: - She underwent left mastectomy on 06/11/2002.  0/6 lymph nodes positive.  ER 90%, PR 92%, Ki-67 6%, HER2 negative.  As per chart, declined antiestrogen therapy.   Plan: 1. Metastatic clear-cell RCC, intermediate risk by IMDC: - She is tolerating lenvatinib 18 mg daily along with everolimus 5 mg daily very well. - She had intermittent diarrhea for 1 month, soft to watery 1 stool per day.  She takes occasional Imodium. - She also reported that she is feeling low as her husband left. - Labs today LFTs are normal.  Creatinine is 1.21.  CBC with normal white count and platelet count. - CT CAP (01/01/2023): Mildly improved disease in the chest.  Stable disease in the abdomen and pelvis with stable liver lesions and retroperitoneal metastasis. - Continue lenvatinib 18 mg daily and everolimus 5 mg daily.  RTC 8 weeks for follow-up.  2.  Normocytic anemia: - Normocytic anemia from CKD, functional iron deficiency and myelosuppression. - Continue iron tablet daily.  Ferritin is 92, percent saturation is  15.  Hemoglobin 9.9.  If she has severe fatigue, will consider parenteral iron therapy.  3.  Hypothyroidism: - Continue Synthroid 112 mcg daily.  Continue follow-up with Dr. Dorris Fetch.  4.  Hypertension: - Reportedly amlodipine 10 mg was discontinued due to leg swelling.  She did not have any leg  swelling while she was taking 5 mg daily. - Currently on spironolactone 25 mg which she will continue.  Blood pressure today is 196/82. - Recommend starting back on amlodipine at a dose of 5 mg daily.  She will continue spironolactone.  She will continue follow-up with Dr. Moshe Cipro.  Orders Placed This Encounter  Procedures   CBC with Differential/Platelet    Standing Status:   Future    Standing Expiration Date:   01/07/2024    Order Specific Question:   Release to patient    Answer:   Immediate   Comprehensive metabolic panel    Standing Status:   Future    Standing Expiration Date:   01/07/2024    Order Specific Question:   Release to patient    Answer:   Immediate   Magnesium    Standing Status:   Future    Standing Expiration Date:   01/07/2024    Order Specific Question:   Release to patient    Answer:   Immediate   Ferritin    Standing Status:   Future    Standing Expiration Date:   01/07/2024    Order Specific Question:   Release to patient    Answer:   Immediate   Iron and TIBC    Standing Status:   Future    Standing Expiration Date:   01/07/2024    Order Specific Question:   Release to patient    Answer:   Immediate      I,Katie Daubenspeck,acting as a scribe for Derek Jack, MD.,have documented all relevant documentation on the behalf of Derek Jack, MD,as directed by  Derek Jack, MD while in the presence of Derek Jack, MD.   I, Derek Jack MD, have reviewed the above documentation for accuracy and completeness, and I agree with the above.   Derek Jack, MD   4/2/20244:52 PM  CHIEF COMPLAINT:   Diagnosis: metastatic clear-cell renal cell carcinoma    Cancer Staging  Malignant neoplasm of female breast Staging form: Breast, AJCC 6th Edition - Clinical: Stage I (T1b, N0, M0) - Signed by Baird Cancer, PA on 08/16/2011  Renal cell cancer Staging form: Kidney, AJCC 8th Edition - Clinical stage from 09/26/2021:  Stage IV (cT3c, cNX, cM1) - Unsigned    Prior Therapy: Nivolumab plus ipilimumab for 4 cycles with progression   Current Therapy:  Lenvatinib and everolimus    HISTORY OF PRESENT ILLNESS:   Oncology History  Renal cell cancer  06/20/2021 Initial Diagnosis   Renal cell carcinoma of right kidney ([place])   11/19/2021 - 01/24/2022 Chemotherapy   Patient is on Treatment Plan : RENAL CELL CARCINOMA Nivolumab + Ipilimumab q21d / Nivolumab q28d      Genetic Testing   Negative genetic testing. No pathogenic variants identified on the [facility] Multi-Cancer Panel + RNA. VUS in PMS2 called c.1732C>A and in RECQL4 called c.2967G>A  Identified. The report date is 11/01/2021.   The Multi-Cancer Panel + RNA offered by [facility] includes sequencing and/or deletion duplication testing of the following 84 genes: AIP, ALK, APC, ATM, AXIN2,BAP1,  BARD1, BLM, BMPR1A, BRCA1, BRCA2, BRIP1, CASR, CDC73, CDH1, CDK4, CDKN1B, CDKN1C, CDKN2A (p14ARF), CDKN2A (p16INK4a), CEBPA,  CHEK2, CTNNA1, DICER1, DIS3L2, EGFR (c.2369C>T, p.Thr790Met variant only), EPCAM (Deletion/duplication testing only), FH, FLCN, GATA2, GPC3, GREM1 (Promoter region deletion/duplication testing only), HOXB13 (c.251G>A, p.Gly84Glu), HRAS, KIT, MAX, MEN1, MET, MITF (c.952G>A, p.Glu318Lys variant only), MLH1, MSH2, MSH3, MSH6, MUTYH, NBN, NF1, NF2, NTHL1, PALB2, PDGFRA, PHOX2B, PMS2, POLD1, POLE, POT1, PRKAR1A, PTCH1, PTEN, RAD50, RAD51C, RAD51D, RB1, RECQL4, RET, RUNX1, SDHAF2, SDHA (sequence changes only), SDHB, SDHC, SDHD, SMAD4, SMARCA4, SMARCB1, SMARCE1, STK11, SUFU, TERC, TERT, TMEM127, TP53, TSC1, TSC2, VHL, WRN and WT1.       INTERVAL HISTORY:   Deanna Bush is a 62 y.o. female presenting to clinic today for follow up of metastatic clear-cell renal cell carcinoma. She was last seen by me on 10/29/22.  Since her last visit, she underwent surveillance CT C/A/P on 01/01/23 showing: improvement in areas of clustered nodularity in left upper lobe; no thoracic  adenopathy; similar hepatic and extensive abdominal retroperitoneal metastases; possible minimal thrombus within right side of IVC; increase in small volume pelvic and new trace abdominal ascites.  Today, she states that she is doing well overall. Her appetite level is at 100%. Her energy level is at 100%.  PAST MEDICAL HISTORY:   Past Medical History: Past Medical History:  Diagnosis Date   Adenocarcinoma of breast    left    Cellulitis of leg, left    Complication of anesthesia    Hard to wake up   Diabetes mellitus without complication    Pt denies   Family history of breast cancer 08/16/2011   Family history of breast cancer    Family history of colon cancer    Family history of kidney cancer    Family history of prostate cancer    Hyperlipidemia    Hypertension    Hypothyroidism    MRSA (methicillin resistant staph aureus) culture positive 08/19/2011   Pre-diabetes     Surgical History: Past Surgical History:  Procedure Laterality Date   BREAST SURGERY Left    mastectomy   CESAREAN SECTION     x2   COLONOSCOPY N/A 03/03/2020   Procedure: COLONOSCOPY;  Surgeon: Daneil Dolin, MD;  Location: [facility];  Service: Endoscopy;  Laterality: N/A;  12:00   IR IMAGING GUIDED PORT INSERTION  11/07/2021   left mastectomy     NEPHRECTOMY Right 02/06/2021   Procedure: NEPHRECTOMY- open radical;  Surgeon: Cleon Gustin, MD;  Location: [facility];  Service: Urology;  Laterality: Right;   POLYPECTOMY  03/03/2020   Procedure: POLYPECTOMY;  Surgeon: Daneil Dolin, MD;  Location: [facility];  Service: Endoscopy;;    Social History: Social History   Socioeconomic History   Marital status: Married    Spouse name: Not on file   Number of children: 2   Years of education: Not on file   Highest education level: Not on file  Occupational History   Occupation: CNA  Tobacco Use   Smoking status: Former    Packs/day: 0.50    Years: 18.00    Additional pack years: 0.00     Total pack years: 9.00    Types: Cigarettes    Quit date: 07/01/2002    Years since quitting: 20.5   Smokeless tobacco: Never  Vaping Use   Vaping Use: Never used  Substance and Sexual Activity   Alcohol use: No   Drug use: No   Sexual activity: Yes    Birth control/protection: None  Other Topics Concern   Not on file  Social History Narrative  Not on file   Social Determinants of Health   Financial Resource Strain: Not on file  Food Insecurity: Not on file  Transportation Needs: Not on file  Physical Activity: Not on file  Stress: Not on file  Social Connections: Not on file  Intimate Partner Violence: Not on file    Family History: Family History  Problem Relation Age of Onset   Alcohol abuse Mother    Hypertension Mother    Diabetes Mother    Hyperlipidemia Mother    Colon cancer Maternal Aunt        dx 8s   Prostate cancer Maternal Uncle    Throat cancer Maternal Uncle    Kidney cancer Maternal Grandfather    Lung cancer Half-Sister    Kidney cancer Half-Sister 52   Breast cancer Half-Sister 72   Breast cancer Half-Sister 37    Current Medications:  Current Outpatient Medications:    acetaminophen (TYLENOL) 500 MG tablet, Take 500 mg by mouth every 6 (six) hours as needed for moderate pain or headache., Disp: , Rfl:    atorvastatin (LIPITOR) 10 MG tablet, TAKE ONE TABLET BY MOUTH ONCE DAILY, Disp: 90 tablet, Rfl: 3   EPINEPHrine 0.3 mg/0.3 mL IJ SOAJ injection, Inject 0.3 mg into the muscle as needed for anaphylaxis., Disp: 2 each, Rfl: 0   everolimus (AFINITOR) 5 MG tablet, Take 1 tablet (5 mg total) by mouth daily., Disp: 30 tablet, Rfl: 3   ezetimibe (ZETIA) 10 MG tablet, Take 1 tablet (10 mg total) by mouth daily., Disp: 90 tablet, Rfl: 3   Iron, Ferrous Sulfate, 325 (65 Fe) MG TABS, Take 325 mg by mouth daily., Disp: 30 tablet, Rfl: 3   lenvatinib 18 mg daily dose (LENVIMA, 18 MG DAILY DOSE,) 10 MG & 2 x 4 MG capsule, Take two 4mg  capsules and one 10mg   capsule by mouth daily (total dose 18mg /day), Disp: 90 each, Rfl: 1   levothyroxine (SYNTHROID) 112 MCG tablet, Take 1 tablet (112 mcg total) by mouth daily before breakfast., Disp: 90 tablet, Rfl: 1   levothyroxine (SYNTHROID) 112 MCG tablet, Take 1 tablet (112 mcg total) by mouth daily., Disp: 90 tablet, Rfl: 0   mirtazapine (REMERON) 7.5 MG tablet, Take 1 tablet (7.5 mg total) by mouth at bedtime., Disp: 90 tablet, Rfl: 0   Multiple Vitamin (MULTIVITAMIN WITH MINERALS) TABS tablet, Take 1 tablet by mouth daily., Disp: , Rfl:    spironolactone (ALDACTONE) 25 MG tablet, Take 1 tablet (25 mg total) by mouth daily., Disp: 30 tablet, Rfl: 2   UNABLE TO FIND, MASTECTOMY BRA AND PROTHESIS DX Z90.12, Disp: 6 each, Rfl: 2   UNABLE TO FIND, 6 mastectomy prosthesis and bras., Disp: 6 each, Rfl: 0   urea (CARMOL) 10 % cream, Apply topically 2 (two) times daily. Apply twice daily to hands, Disp: 71 g, Rfl: 0 No current facility-administered medications for this visit.  Facility-Administered Medications Ordered in Other Visits:    heparin lock flush 100 unit/mL, 500 Units, Intravenous, Once, Derek Jack, MD   sodium chloride flush (NS) 0.9 % injection 10 mL, 10 mL, Intravenous, Once, Derek Jack, MD   Allergies: Allergies  Allergen Reactions   Amlodipine Swelling    Leg edema   Sulfonamide Derivatives Itching   Yellow Jacket Venom [Bee Venom] Rash    urticaria    REVIEW OF SYSTEMS:   Review of Systems  Constitutional:  Negative for chills, fatigue and fever.  HENT:   Negative for lump/mass, mouth sores, nosebleeds,  sore throat and trouble swallowing.   Eyes:  Negative for eye problems.  Respiratory:  Negative for cough and shortness of breath.   Cardiovascular:  Positive for leg swelling. Negative for chest pain and palpitations.  Gastrointestinal:  Positive for diarrhea. Negative for abdominal pain, constipation, nausea and vomiting.  Genitourinary:  Negative for bladder  incontinence, difficulty urinating, dysuria, frequency, hematuria and nocturia.   Musculoskeletal:  Negative for arthralgias, back pain, flank pain, myalgias and neck pain.  Skin:  Negative for itching and rash.  Neurological:  Negative for dizziness, headaches and numbness.  Hematological:  Does not bruise/bleed easily.  Psychiatric/Behavioral:  Negative for depression, sleep disturbance and suicidal ideas. The patient is not nervous/anxious.   All other systems reviewed and are negative.    VITALS:   Blood pressure (!) 188/82, pulse 64, temperature 98.2 F (36.8 C), temperature source Oral, resp. rate 18, weight 166 lb 3.2 oz (75.4 kg), SpO2 100 %.  Wt Readings from Last 3 Encounters:  01/07/23 166 lb 3.2 oz (75.4 kg)  12/23/22 165 lb 12.8 oz (75.2 kg)  12/19/22 167 lb (75.8 kg)    Body mass index is 26.83 kg/m.  Performance status (ECOG): 0 - Asymptomatic  PHYSICAL EXAM:   Physical Exam Vitals and nursing note reviewed. Exam conducted with a chaperone present.  Constitutional:      Appearance: Normal appearance.  Cardiovascular:     Rate and Rhythm: Normal rate and regular rhythm.     Pulses: Normal pulses.     Heart sounds: Normal heart sounds.  Pulmonary:     Effort: Pulmonary effort is normal.     Breath sounds: Normal breath sounds.  Abdominal:     Palpations: Abdomen is soft. There is no hepatomegaly, splenomegaly or mass.     Tenderness: There is no abdominal tenderness.  Musculoskeletal:     Right lower leg: Edema present.     Left lower leg: Edema present.  Lymphadenopathy:     Cervical: No cervical adenopathy.     Right cervical: No superficial, deep or posterior cervical adenopathy.    Left cervical: No superficial, deep or posterior cervical adenopathy.     Upper Body:     Right upper body: No supraclavicular or axillary adenopathy.     Left upper body: No supraclavicular or axillary adenopathy.  Neurological:     General: No focal deficit present.      Mental Status: She is alert and oriented to person, place, and time.  Psychiatric:        Mood and Affect: Mood normal.        Behavior: Behavior normal.     LABS:      Latest Ref Rng & Units 12/23/2022    1:57 PM 10/29/2022    1:58 PM 09/11/2022    2:14 PM  CBC  WBC 4.0 - 10.5 K/uL 4.1  4.9  4.0   Hemoglobin 12.0 - 15.0 g/dL 9.9  10.4  9.5   Hematocrit 36.0 - 46.0 % 30.9  32.5  29.8   Platelets 150 - 400 K/uL 193  192  208       Latest Ref Rng & Units 12/23/2022    1:57 PM 10/29/2022    1:58 PM 10/14/2022   12:12 PM  CMP  Glucose 70 - 99 mg/dL 110  94  97   BUN 8 - 23 mg/dL 12  13  12    Creatinine 0.44 - 1.00 mg/dL 1.21  1.09  1.07   Sodium  135 - 145 mmol/L 135  133  141   Potassium 3.5 - 5.1 mmol/L 3.5  3.4  4.7   Chloride 98 - 111 mmol/L 103  101  103   CO2 22 - 32 mmol/L 25  26  24    Calcium 8.9 - 10.3 mg/dL 8.5  8.5  9.6   Total Protein 6.5 - 8.1 g/dL 6.7  7.1  6.8   Total Bilirubin 0.3 - 1.2 mg/dL 0.6  0.4  0.3   Alkaline Phos 38 - 126 U/L 89  84  115   AST 15 - 41 U/L 31  26  24    ALT 0 - 44 U/L 22  19  18       No results found for: "CEA1", "CEA" / No results found for: "CEA1", "CEA" No results found for: "PSA1" No results found for: "[id number]" No results found for: "CAN125"  No results found for: "TOTALPROTELP", "ALBUMINELP", "A1GS", "A2GS", "BETS", "BETA2SER", "GAMS", "MSPIKE", "SPEI" Lab Results  Component Value Date   TIBC 282 12/23/2022   TIBC 289 10/29/2022   TIBC 291 09/11/2022   FERRITIN 92 12/23/2022   FERRITIN 91 10/29/2022   FERRITIN 74 09/11/2022   IRONPCTSAT 15 12/23/2022   IRONPCTSAT 20 10/29/2022   IRONPCTSAT 11 09/11/2022   Lab Results  Component Value Date   LDH 178 02/21/2022   LDH 152 09/27/2021     STUDIES:   CT CHEST ABDOMEN PELVIS W CONTRAST  Result Date: 01/02/2023 CLINICAL DATA:  Renal cell carcinoma of right kidney. Surveillance. * Tracking Code: BO * EXAM: CT CHEST, ABDOMEN, AND PELVIS WITH CONTRAST TECHNIQUE: Multidetector  CT imaging of the chest, abdomen and pelvis was performed following the standard protocol during bolus administration of intravenous contrast. RADIATION DOSE REDUCTION: This exam was performed according to the departmental dose-optimization program which includes automated exposure control, adjustment of the mA and/or kV according to patient size and/or use of iterative reconstruction technique. CONTRAST:  68mL OMNIPAQUE IOHEXOL 300 MG/ML  SOLN COMPARISON:  09/09/2022 abdominopelvic CT. Most recent chest CT of 02/14/2022 FINDINGS: CT CHEST FINDINGS Cardiovascular: Right Port-A-Cath tip mid right atrium. Aortic atherosclerosis. Tortuous thoracic aorta. Normal heart size, without pericardial effusion. No central pulmonary embolism, on this non-dedicated study. Mediastinum/Nodes: No supraclavicular adenopathy. Left axillary node dissection. No axillary adenopathy. No mediastinal or hilar adenopathy. Lungs/Pleura: Trace right pleural fluid is not significantly changed. Anterior left apical pleural-based 11 mm nodule on 24/4 measured 9 mm on 02/14/2022 (when remeasured). 2-3 mm posterior left upper lobe pulmonary nodule on 38/4 is unchanged. The anterior left upper lobe clustered pulmonary nodules 02/14/2022 are improved, with only mild interstitial thickening remaining today on 56/4. Right middle lobe and anterior right lower lobe peribronchovascular nodularity is also significantly improved, nearly resolved. Musculoskeletal: Left mastectomy.  No acute osseous abnormality. CT ABDOMEN PELVIS FINDINGS Hepatobiliary: 8 mm hypoattenuating right liver lobe lesion on 47/2 measured 9 mm on the prior (when remeasured). More anterior right hepatic lobe hypoattenuating 11 mm lesion on 51/2 is similar to on the prior exam (when remeasured). Posterior segment 4A/B 2.4 x 1.9 cm hypoattenuating lesion on 56/2 versus 2.2 x 1.9 cm when remeasured in a similar fashion on the prior. Normal gallbladder, without biliary ductal dilatation.  Pancreas: Normal, without mass or ductal dilatation. Spleen: Normal in size, without focal abnormality. Adrenals/Urinary Tract: Normal left adrenal gland. Interpolar left renal too small to characterize lesion is most likely a cyst at 4 mm . In the absence of clinically indicated signs/symptoms require(s) no  independent follow-up. Right nephrectomy and probable adrenalectomy. Local recurrence within the hepatorenal fossa again identified. Index lesion along the right hepatic capsule measures 5.1 x 3.6 cm on 57/2 versus 5.3 x 3.4 cm on the prior exam (when remeasured). Adjacent right posterolateral chest wall implant measures 3.2 x 2.6 cm on 61/2 versus 3.5 x 2.4 cm on the prior exam. Implant along the anterior right psoas muscle measures 3.0 x 3.1 cm on 76/2 versus 3.0 x 2.5 cm on the prior when measured in a similar fashion. Normal urinary bladder. Stomach/Bowel: Normal stomach, without wall thickening. Normal colon and terminal ileum. Normal small bowel. Vascular/Lymphatic: Aortic atherosclerosis. Possible minimal thrombus within the IVC at the level of the left renal vein on 28/7. No pelvic sidewall adenopathy. Reproductive: Normal uterus and adnexa. Other: Increase in small volume pelvic and development of trace upper abdominal fluid. Musculoskeletal: Disc bulges throughout the lumbosacral spine. IMPRESSION: CT CHEST IMPRESSION 1. Since the chest CT of 02/14/2022, improvement in areas of clustered nodularity which may have represented metastatic disease or been infectious/inflammatory. A pleural-based anterior left apical pulmonary nodule has mildly enlarged and may represent isolated residual pulmonary metastasis. 2. No thoracic adenopathy. 3. Similar trace right pleural fluid. CT ABDOMEN AND PELVIS IMPRESSION 1. Since 09/09/2022 abdominopelvic CT, similar hepatic metastasis. 2. Extensive abdominal retroperitoneal metastasis in the setting of right nephrectomy. Felt to be similar. 3. Possible minimal thrombus  within the right-side of the IVC, level of the left renal vein. Recommend attention on follow-up. 4. Increase in small volume pelvic and new trace abdominal ascites. 5.  Aortic Atherosclerosis (ICD10-I70.0). Electronically Signed   By: Abigail Miyamoto M.D.   On: 01/02/2023 13:54

## 2023-01-07 ENCOUNTER — Encounter: Payer: Self-pay | Admitting: Hematology

## 2023-01-07 ENCOUNTER — Inpatient Hospital Stay: Payer: No Typology Code available for payment source | Attending: Hematology | Admitting: Hematology

## 2023-01-07 VITALS — BP 188/82 | HR 64 | Temp 98.2°F | Resp 18 | Wt 166.2 lb

## 2023-01-07 DIAGNOSIS — Z800 Family history of malignant neoplasm of digestive organs: Secondary | ICD-10-CM | POA: Diagnosis not present

## 2023-01-07 DIAGNOSIS — E039 Hypothyroidism, unspecified: Secondary | ICD-10-CM | POA: Diagnosis not present

## 2023-01-07 DIAGNOSIS — Z9012 Acquired absence of left breast and nipple: Secondary | ICD-10-CM | POA: Diagnosis not present

## 2023-01-07 DIAGNOSIS — I129 Hypertensive chronic kidney disease with stage 1 through stage 4 chronic kidney disease, or unspecified chronic kidney disease: Secondary | ICD-10-CM | POA: Diagnosis not present

## 2023-01-07 DIAGNOSIS — C641 Malignant neoplasm of right kidney, except renal pelvis: Secondary | ICD-10-CM | POA: Diagnosis not present

## 2023-01-07 DIAGNOSIS — D649 Anemia, unspecified: Secondary | ICD-10-CM | POA: Diagnosis not present

## 2023-01-07 DIAGNOSIS — C50912 Malignant neoplasm of unspecified site of left female breast: Secondary | ICD-10-CM

## 2023-01-07 DIAGNOSIS — Z853 Personal history of malignant neoplasm of breast: Secondary | ICD-10-CM | POA: Insufficient documentation

## 2023-01-07 DIAGNOSIS — Z8051 Family history of malignant neoplasm of kidney: Secondary | ICD-10-CM | POA: Diagnosis not present

## 2023-01-07 DIAGNOSIS — N189 Chronic kidney disease, unspecified: Secondary | ICD-10-CM | POA: Diagnosis not present

## 2023-01-07 DIAGNOSIS — D509 Iron deficiency anemia, unspecified: Secondary | ICD-10-CM | POA: Insufficient documentation

## 2023-01-07 DIAGNOSIS — Z87891 Personal history of nicotine dependence: Secondary | ICD-10-CM | POA: Diagnosis not present

## 2023-01-07 DIAGNOSIS — Z905 Acquired absence of kidney: Secondary | ICD-10-CM | POA: Diagnosis not present

## 2023-01-07 DIAGNOSIS — Z803 Family history of malignant neoplasm of breast: Secondary | ICD-10-CM | POA: Diagnosis not present

## 2023-01-07 DIAGNOSIS — D631 Anemia in chronic kidney disease: Secondary | ICD-10-CM | POA: Insufficient documentation

## 2023-01-07 DIAGNOSIS — Z801 Family history of malignant neoplasm of trachea, bronchus and lung: Secondary | ICD-10-CM | POA: Diagnosis not present

## 2023-01-07 DIAGNOSIS — C786 Secondary malignant neoplasm of retroperitoneum and peritoneum: Secondary | ICD-10-CM | POA: Insufficient documentation

## 2023-01-07 DIAGNOSIS — C787 Secondary malignant neoplasm of liver and intrahepatic bile duct: Secondary | ICD-10-CM | POA: Diagnosis not present

## 2023-01-07 DIAGNOSIS — Z8042 Family history of malignant neoplasm of prostate: Secondary | ICD-10-CM | POA: Diagnosis not present

## 2023-01-07 NOTE — Patient Instructions (Signed)
[facility]  Discharge Instructions  You were seen and examined today by Dr. Delton Coombes.  Dr. Delton Coombes discussed your most recent lab work and CT scan which revealed that everything looks good. Your blood pressure is high.  Dr. Delton Coombes wants you to start back on the amlodipine 5 mg once daily and continue taking the spironolactone 25 mg.   Follow-up as scheduled in 8 weeks with labs.    Thank you for choosing [facility] to provide your oncology and hematology care.   To afford each patient quality time with our provider, please arrive at least 15 minutes before your scheduled appointment time. You may need to reschedule your appointment if you arrive late (10 or more minutes). Arriving late affects you and other patients whose appointments are after yours.  Also, if you miss three or more appointments without notifying the office, you may be dismissed from the clinic at the provider's discretion.    Again, thank you for choosing [facility].  Our hope is that these requests will decrease the amount of time that you wait before being seen by our physicians.   If you have a lab appointment with the [facility] - please note that after April 8th, all labs will be drawn in the cancer center.  You do not have to check in or register with the main entrance as you have in the past but will complete your check-in at the cancer center.            _____________________________________________________________  Should you have questions after your visit to [facility], please contact our office at [phone] and follow the prompts.  Our office hours are 8:00 a.m. to 4:30 p.m. Monday - Thursday and 8:00 a.m. to 2:30 p.m. Friday.  Please note that voicemails left after 4:00 p.m. may not be returned until the following business day.  We are closed weekends and all major holidays.  You do have access to a nurse 24-7,  just call the main number to the clinic [phone] and do not press any options, hold on the line and a nurse will answer the phone.    For prescription refill requests, have your pharmacy contact our office and allow 72 hours.    Masks are no longer required in the cancer centers. If you would like for your care team to wear a mask while they are taking care of you, please let them know. You may have one support person who is at least 64 years old accompany you for your appointments.

## 2023-01-09 ENCOUNTER — Ambulatory Visit: Payer: No Typology Code available for payment source | Admitting: Family Medicine

## 2023-01-09 ENCOUNTER — Telehealth: Payer: Self-pay | Admitting: Family Medicine

## 2023-01-09 ENCOUNTER — Encounter: Payer: Self-pay | Admitting: Family Medicine

## 2023-01-09 VITALS — BP 162/82 | HR 74 | Ht 66.0 in | Wt 163.0 lb

## 2023-01-09 DIAGNOSIS — I10 Essential (primary) hypertension: Secondary | ICD-10-CM

## 2023-01-09 MED ORDER — OLMESARTAN MEDOXOMIL-HCTZ 40-25 MG PO TABS: 1.0000 | ORAL_TABLET | Freq: Every day | ORAL | 1 refills | Status: DC

## 2023-01-09 NOTE — Telephone Encounter (Signed)
Scheduled to see Deanna Bush this AM

## 2023-01-09 NOTE — Patient Instructions (Signed)
It was pleasure meeting with you today. Please take medications as prescribed. Follow up with your primary health provider if any health concerns arises. If symptoms worsen please contact your primary care provider and/or visit the emergency department.  

## 2023-01-09 NOTE — Assessment & Plan Note (Addendum)
Vitals:   01/09/23 1046 01/09/23 1047  BP: (!) 166/85 (!) 162/82   Blood pressure not controlled, Advise on medication compliance. Started patient on Olmesartan-HCTZ 40 mg-12.5 D/C Spironolactone 25 mg         Explained non pharmacological interventions such as low salt, DASH diet discussed. Educated on stress reduction and physical activity. Discussed signs and symptoms of major cardiovascular event and need to present to the ED. Follow up in 4 weeks with blood pressure reading log. Patient verbalizes understanding regarding plan of care and all questions answered.

## 2023-01-09 NOTE — Progress Notes (Signed)
Patient Office Visit   Subjective   Patient ID: Deanna Bush, female    DOB: [birth year]  Age: 62 y.o. MRN: [id number]  CC:  Chief Complaint  Patient presents with   Hypertension    Patient states her BP has been elevated. Dr. Moshe Cipro changed BP meds 2 weeks ago to try to lower it but it is still high.     HPI Deanna Bush 62 year old female, presents to clinic for hypertension follow up. She  has a past medical history of Adenocarcinoma of breast, Cellulitis of leg, left, Complication of anesthesia, Diabetes mellitus without complication, Family history of breast cancer (08/16/2011), Family history of breast cancer, Family history of colon cancer, Family history of kidney cancer, Family history of prostate cancer, Hyperlipidemia, Hypertension, Hypothyroidism, MRSA (methicillin resistant staph aureus) culture positive (08/19/2011), and Pre-diabetes.  Hypertension: Patient here for follow-up of elevated blood pressure. She is exercising patient reports walking 2 miles 3 times a week and is adherent to low salt diet.  Blood pressure is not well controlled at home. Patient denies cardiac symptoms chest pain, chest pressure/discomfort, dyspnea, exertional chest pressure/discomfort, palpitations, syncope, and tachypnea. Patient reports lower extremity edema and headaches.  Cardiovascular risk factors: dyslipidemia and hypertension.     Outpatient Encounter Medications as of 01/09/2023  Medication Sig   acetaminophen (TYLENOL) 500 MG tablet Take 500 mg by mouth every 6 (six) hours as needed for moderate pain or headache.   atorvastatin (LIPITOR) 10 MG tablet TAKE ONE TABLET BY MOUTH ONCE DAILY   EPINEPHrine 0.3 mg/0.3 mL IJ SOAJ injection Inject 0.3 mg into the muscle as needed for anaphylaxis.   everolimus (AFINITOR) 5 MG tablet Take 1 tablet (5 mg total) by mouth daily.   ezetimibe (ZETIA) 10 MG tablet Take 1 tablet (10 mg total) by mouth daily.   Iron, Ferrous Sulfate, 325 (65 Fe) MG TABS  Take 325 mg by mouth daily.   lenvatinib 18 mg daily dose (LENVIMA, 18 MG DAILY DOSE,) 10 MG & 2 x 4 MG capsule Take two 4mg  capsules and one 10mg  capsule by mouth daily (total dose 18mg /day)   levothyroxine (SYNTHROID) 112 MCG tablet Take 1 tablet (112 mcg total) by mouth daily before breakfast.   levothyroxine (SYNTHROID) 112 MCG tablet Take 1 tablet (112 mcg total) by mouth daily.   mirtazapine (REMERON) 7.5 MG tablet Take 1 tablet (7.5 mg total) by mouth at bedtime.   Multiple Vitamin (MULTIVITAMIN WITH MINERALS) TABS tablet Take 1 tablet by mouth daily.   olmesartan-hydrochlorothiazide (BENICAR HCT) 40-25 MG tablet Take 1 tablet by mouth daily.   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   urea (CARMOL) 10 % cream Apply topically 2 (two) times daily. Apply twice daily to hands   [DISCONTINUED] spironolactone (ALDACTONE) 25 MG tablet Take 1 tablet (25 mg total) by mouth daily.   Facility-Administered Encounter Medications as of 01/09/2023  Medication   heparin lock flush 100 unit/mL   sodium chloride flush (NS) 0.9 % injection 10 mL    Past Surgical History:  Procedure Laterality Date   BREAST SURGERY Left    mastectomy   CESAREAN SECTION     x2   COLONOSCOPY N/A 03/03/2020   Procedure: COLONOSCOPY;  Surgeon: Daneil Dolin, MD;  [facility]: [facility];  Service: Endoscopy;  Laterality: N/A;  12:00   IR IMAGING GUIDED PORT INSERTION  11/07/2021   left mastectomy  NEPHRECTOMY Right 02/06/2021   Procedure: NEPHRECTOMY- open radical;  Surgeon: Cleon Gustin, MD;  Location: [facility];  Service: Urology;  Laterality: Right;   POLYPECTOMY  03/03/2020   Procedure: POLYPECTOMY;  Surgeon: Daneil Dolin, MD;  [facility]: [facility];  Service: Endoscopy;;    Review of Systems  Constitutional:  Negative for chills and fever.  Respiratory:  Negative for shortness of breath.   Cardiovascular:  Positive for leg swelling.  Negative for chest pain and palpitations.  Gastrointestinal:  Negative for abdominal pain, nausea and vomiting.  Genitourinary:  Negative for dysuria.  Neurological:  Positive for headaches. Negative for dizziness.      Objective    BP (!) 162/82   Pulse 74   Ht 5\' 6"  (1.676 m)   Wt 163 lb (73.9 kg)   SpO2 97%   BMI 26.31 kg/m   Physical Exam Vitals reviewed.  Constitutional:      General: She is not in acute distress.    Appearance: Normal appearance. She is not ill-appearing, toxic-appearing or diaphoretic.  Eyes:     General:        Right eye: No discharge.        Left eye: No discharge.     Conjunctiva/sclera: Conjunctivae normal.  Cardiovascular:     Rate and Rhythm: Normal rate and regular rhythm.     Heart sounds: Normal heart sounds.  Pulmonary:     Effort: Pulmonary effort is normal. No respiratory distress.     Breath sounds: Normal breath sounds.  Musculoskeletal:        General: Normal range of motion.     Cervical back: Normal range of motion.     Right lower leg: Edema present.     Left lower leg: Edema present.  Skin:    General: Skin is warm and dry.  Neurological:     General: No focal deficit present.     Mental Status: She is alert and oriented to person, place, and time. Mental status is at baseline.  Psychiatric:        Mood and Affect: Mood normal.        Behavior: Behavior normal.        Thought Content: Thought content normal.        Judgment: Judgment normal.       Assessment & Plan:  Primary hypertension Assessment & Plan: Vitals:   01/09/23 1046 01/09/23 1047  BP: (!) 166/85 (!) 162/82   Blood pressure not controlled, Advise on medication compliance. Started patient on Olmesartan-HCTZ 40 mg-12.5 D/C Spironolactone 25 mg         Explained non pharmacological interventions such as low salt, DASH diet discussed. Educated on stress reduction and physical activity. Discussed signs and symptoms of major cardiovascular event and need to  present to the ED. Follow up in 4 weeks with blood pressure reading log. Patient verbalizes understanding regarding plan of care and all questions answered.     Other orders -     Olmesartan Medoxomil-HCTZ; Take 1 tablet by mouth daily.  Dispense: 30 tablet; Refill: 1    Return in about 4 weeks (around 02/06/2023) for hypertension, re-check blood pressure.   Renard Hamper Ria Comment, FNP

## 2023-01-09 NOTE — Telephone Encounter (Signed)
BP is running high 185/97. States BP med was changed & it is not working. Wants to know what to do? Headache x 2 days

## 2023-01-14 LAB — LIPID PANEL
Chol/HDL Ratio: 3.6 ratio (ref 0.0–4.4)
Cholesterol, Total: 281 mg/dL — ABNORMAL HIGH (ref 100–199)
HDL: 79 mg/dL (ref >39)
LDL Chol Calc (NIH): 178 mg/dL — ABNORMAL HIGH (ref 0–99)
Triglycerides: 137 mg/dL (ref 0–149)
VLDL Cholesterol Cal: 24 mg/dL (ref 5–40)

## 2023-01-14 LAB — TSH: TSH: 57.2 u[IU]/mL — ABNORMAL HIGH (ref 0.450–4.500)

## 2023-01-14 LAB — T4, FREE: Free T4: 0.94 ng/dL (ref 0.82–1.77)

## 2023-01-16 ENCOUNTER — Ambulatory Visit (HOSPITAL_COMMUNITY): Payer: No Typology Code available for payment source | Admitting: Psychiatry

## 2023-01-16 ENCOUNTER — Ambulatory Visit: Payer: No Typology Code available for payment source | Attending: Internal Medicine | Admitting: Internal Medicine

## 2023-01-16 ENCOUNTER — Encounter: Payer: Self-pay | Admitting: Internal Medicine

## 2023-01-16 VITALS — BP 132/78 | HR 76 | Ht 66.0 in | Wt 159.6 lb

## 2023-01-16 DIAGNOSIS — M7989 Other specified soft tissue disorders: Secondary | ICD-10-CM | POA: Insufficient documentation

## 2023-01-16 DIAGNOSIS — F4323 Adjustment disorder with mixed anxiety and depressed mood: Secondary | ICD-10-CM

## 2023-01-16 NOTE — Progress Notes (Signed)
Cardiology Office Note  Date: 01/16/2023   ID: Deanna Bush, DOB 1960-12-09, MRN [id number]  PCP:  Kerri PerchesSimpson, Margaret E, MD  Cardiologist:  Marjo BickerVishnu P Cleda Imel, MD Electrophysiologist:  None   Reason for Office Visit: Evaluation of leg swelling at the request of Dr. Lodema HongSimpson   History of Present Illness: Deanna BettersDonna M Bush is a 62 y.o. female known to have HTN, HLD, metastatic renal cell carcinoma s/p R radical nephrectomy in 2022, L breast cancer s/p L mastectomy in 2003 was referred to cardiology clinic for evaluation of bilateral leg swelling.  Patient reported that her leg swelling significantly improved after antihypertensive medications were switched around. Denies any angina, DOE, dizziness, lightheadedness, syncope and improving leg swelling. She started to walk 2 to 3 miles every day recently and feels good, has no symptoms.  No family history of CAD or CHF. Denies smoking cigarettes, alcohol use and illicit drug abuse. Her PCP started her on ezetimibe recently but she wanted to get an okay from her oncologist before starting the medication.  Past Medical History:  Diagnosis Date   Adenocarcinoma of breast    left    Cellulitis of leg, left    Complication of anesthesia    Hard to wake up   Diabetes mellitus without complication    Pt denies   Family history of breast cancer 08/16/2011   Family history of breast cancer    Family history of colon cancer    Family history of kidney cancer    Family history of prostate cancer    Hyperlipidemia    Hypertension    Hypothyroidism    MRSA (methicillin resistant staph aureus) culture positive 08/19/2011   Pre-diabetes     Past Surgical History:  Procedure Laterality Date   BREAST SURGERY Left    mastectomy   CESAREAN SECTION     x2   COLONOSCOPY N/A 03/03/2020   Procedure: COLONOSCOPY;  Surgeon: Corbin Adeourk, Robert M, MD;  [facility];  Service: Endoscopy;  Laterality: N/A;  12:00   IR IMAGING GUIDED PORT INSERTION   11/07/2021   left mastectomy     NEPHRECTOMY Right 02/06/2021   Procedure: NEPHRECTOMY- open radical;  Surgeon: Malen GauzeMcKenzie, Patrick L, MD;  Location: [facility];  Service: Urology;  Laterality: Right;   POLYPECTOMY  03/03/2020   Procedure: POLYPECTOMY;  Surgeon: Corbin Adeourk, Robert M, MD;  [facility];  Service: Endoscopy;;    Current Outpatient Medications  Medication Sig Dispense Refill   acetaminophen (TYLENOL) 500 MG tablet Take 500 mg by mouth every 6 (six) hours as needed for moderate pain or headache.     atorvastatin (LIPITOR) 10 MG tablet TAKE ONE TABLET BY MOUTH ONCE DAILY 90 tablet 3   EPINEPHrine 0.3 mg/0.3 mL IJ SOAJ injection Inject 0.3 mg into the muscle as needed for anaphylaxis. 2 each 0   everolimus (AFINITOR) 5 MG tablet Take 1 tablet (5 mg total) by mouth daily. 30 tablet 3   ezetimibe (ZETIA) 10 MG tablet Take 1 tablet (10 mg total) by mouth daily. 90 tablet 3   Iron, Ferrous Sulfate, 325 (65 Fe) MG TABS Take 325 mg by mouth daily. 30 tablet 3   lenvatinib 18 mg daily dose (LENVIMA, 18 MG DAILY DOSE,) 10 MG & 2 x 4 MG capsule Take two 4mg  capsules and one 10mg  capsule by mouth daily (total dose 18mg /day) 90 each 1   levothyroxine (SYNTHROID) 112 MCG tablet Take 1 tablet (112 mcg total) by mouth daily. 90  tablet 0   mirtazapine (REMERON) 7.5 MG tablet Take 1 tablet (7.5 mg total) by mouth at bedtime. 90 tablet 0   Multiple Vitamin (MULTIVITAMIN WITH MINERALS) TABS tablet Take 1 tablet by mouth daily.     olmesartan-hydrochlorothiazide (BENICAR HCT) 40-25 MG tablet Take 1 tablet by mouth daily. 30 tablet 1   UNABLE TO FIND MASTECTOMY BRA AND PROTHESIS DX Z90.12 6 each 2   UNABLE TO FIND 6 mastectomy prosthesis and bras. 6 each 0   urea (CARMOL) 10 % cream Apply topically 2 (two) times daily. Apply twice daily to hands 71 g 0   No current facility-administered medications for this visit.   Facility-Administered Medications Ordered in Other Visits  Medication Dose Route  Frequency Provider Last Rate Last Admin   heparin lock flush 100 unit/mL  500 Units Intravenous Once Doreatha Massed, MD       sodium chloride flush (NS) 0.9 % injection 10 mL  10 mL Intravenous Once Doreatha Massed, MD       Allergies:  Amlodipine, Sulfonamide derivatives, and Yellow jacket venom [bee venom]   Social History: The patient  reports that she quit smoking about 20 years ago. Her smoking use included cigarettes. She has a 9.00 pack-year smoking history. She has never used smokeless tobacco. She reports that she does not drink alcohol and does not use drugs.   Family History: [name] family history includes Alcohol abuse in her mother; Breast cancer (age of onset: 39) in her half-sister; Breast cancer (age of onset: 59) in her half-sister; Colon cancer in her maternal aunt; Diabetes in her mother; Hyperlipidemia in her mother; Hypertension in her mother; Kidney cancer in her maternal grandfather; Kidney cancer (age of onset: 38) in her half-sister; Lung cancer in her half-sister; Prostate cancer in her maternal uncle; Throat cancer in her maternal uncle.   ROS:  Please see the history of present illness. Otherwise, complete review of systems is positive for none.  All other systems are reviewed and negative.   Physical Exam: VS:  BP 132/78   Pulse 76   Ht 5\' 6"  (1.676 m)   Wt 159 lb 9.6 oz (72.4 kg)   SpO2 99%   BMI 25.76 kg/m , BMI Body mass index is 25.76 kg/m.  Wt Readings from Last 3 Encounters:  01/16/23 159 lb 9.6 oz (72.4 kg)  01/09/23 163 lb (73.9 kg)  01/07/23 166 lb 3.2 oz (75.4 kg)    General: Patient appears comfortable at rest. HEENT: Conjunctiva and lids normal, oropharynx clear with moist mucosa. Neck: Supple, no elevated JVP or carotid bruits, no thyromegaly. Lungs: Clear to auscultation, nonlabored breathing at rest. Cardiac: Regular rate and rhythm, no S3 or significant systolic murmur, no pericardial rub. Abdomen: Soft, nontender, no  hepatomegaly, bowel sounds present, no guarding or rebound. Extremities: No pitting edema, distal pulses 2+. Skin: Warm and dry. Musculoskeletal: No kyphosis. Neuropsychiatric: Alert and oriented x3, affect grossly appropriate.  Recent Labwork: 12/23/2022: ALT 22; AST 31; BUN 12; Creatinine, Ser 1.21; Hemoglobin 9.9; Magnesium 1.9; Platelets 193; Potassium 3.5; Sodium 135 01/13/2023: TSH 57.200     Component Value Date/Time   CHOL 281 (H) 01/13/2023 1121   TRIG 137 01/13/2023 1121   HDL 79 01/13/2023 1121   CHOLHDL 3.6 01/13/2023 1121   CHOLHDL 2.7 10/23/2018 1148   VLDL 7 08/12/2016 0913   LDLCALC 178 (H) 01/13/2023 1121   LDLCALC 100 (H) 10/23/2018 1148    Assessment and Plan: Patient is a 62 year old F known  to have HTN, HLD, metastatic renal cell carcinoma s/p R radical nephrectomy in 2022, L breast cancer s/p L mastectomy in 2003 was referred to cardiology clinic for evaluation of bilateral leg swelling.  # Bilateral lower EXTR swelling: Resolved, no further testing needed at this time. # HTN, controlled: Continue olmesartan-HCTZ 40-25 mg once daily, HTN per PCP # HLD: Continue atorvastatin 10 mg nightly, patient wants an okay from her oncologist before starting to take ezetimibe 10 mg once daily.  Continue diet and exercise.  I have spent a total of 30 minutes with patient reviewing chart, EKGs, labs and examining patient as well as establishing an assessment and plan that was discussed with the patient.  > 50% of time was spent in direct patient care.     Medication Adjustments/Labs and Tests Ordered: Current medicines are reviewed at length with the patient today.  Concerns regarding medicines are outlined above.   Tests Ordered: No orders of the defined types were placed in this encounter.   Medication Changes: No orders of the defined types were placed in this encounter.   Disposition:  Follow up     Signed, Stephannie Broner Verne Spurr, MD, 01/16/2023 8:36 AM    [facility] [address] [place], [place] [address]

## 2023-01-16 NOTE — Patient Instructions (Signed)
Medication Instructions:  Your physician recommends that you continue on your current medications as directed. Please refer to the Current Medication list given to you today.  *If you need a refill on your cardiac medications before your next appointment, please call your pharmacy*   Lab Work: None If you have labs (blood work) drawn today and your tests are completely normal, you will receive your results only by: MyChart Message (if you have MyChart) OR A paper copy in the mail If you have any lab test that is abnormal or we need to change your treatment, we will call you to review the results.   Testing/Procedures: None   Follow-Up: At [facility], you and your health needs are our priority.  As part of our continuing mission to provide you with exceptional heart care, we have created designated Provider Care Teams.  These Care Teams include your primary Cardiologist (physician) and Advanced Practice Providers (APPs -  Physician Assistants and Nurse Practitioners) who all work together to provide you with the care you need, when you need it.  We recommend signing up for the patient portal called "MyChart".  Sign up information is provided on this After Visit Summary.  MyChart is used to connect with patients for Virtual Visits (Telemedicine).  Patients are able to view lab/test results, encounter notes, upcoming appointments, etc.  Non-urgent messages can be sent to your provider as well.   To learn more about what you can do with MyChart, go to [email or web address].    Your next appointment:   Follow up as needed.    Provider:   Vishnu Mallipeddi, MD    Other Instructions    

## 2023-01-16 NOTE — Progress Notes (Signed)
IN-PERSON  THERAPIST PROGRESS NOTE  Session Time: Thursday 01/16/2023 4:12 PM - 4:58 PM   Participation Level: Active  Behavioral Response: CasualAlert/sad, anxious,tearful at times  Type of Therapy: Individual Therapy  Treatment Goals addressed: Establish rapport, have healthy grieving process  ProgressTowards Goals: Will develop formal treatment plan next session  Interventions: Supportive  Summary: Deanna Bush is a 62 y.o. female who p is referred for services by PCP Dr. Lodema Hong due to pt experiencing symptoms of anxiety along with grief and loss issues. Pt denies any psychiatric hospitalizations. Pt has no previous involvement in outpatient therapy.  Per patient's report her stepdaughter was diagnosed with colon cancer at age 59 and died in 19-Nov-2022.  Patient was her caretaker and says patient died at her home.  Patient reports her husband left her about 2 weeks ago for 29 year old after 30 years of marriage.  Patient reports additional stress regarding providing care for her 44 year old mother.  Patient also has her own health issues as she was diagnosed with kidney cancer 2 years ago and had chemo.  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 She currently is taking experimental drugs.  Patient reports crying spells, sadness, irritability, worry, anxiety, and sleeping difficulty.   Patient last was seen about 4 weeks ago for the assessment appointment.  She reports decreased nervousness and improved sleep patterns since last session.  She continues to experience grief and loss issues regarding the death of her stepdaughter.  She reports additional loss issues related to her stepdaughter's 36 year old child now residing in another state.  Per patient's report her step grandchild used to reside with her but went to live with her stepdaughter's ex-boyfriend upon her mother's death.  Patient expresses concern regarding step granddaughters welfare.  She reports  telephone contact with her about once a month has not seen her since January.  She reports beginning to cope better with the dissolution of her marriage.  She reports husband continues to come by her home and pick up a few items at a time.  She reports some financial stress as husband is no longer providing any financial support for their household expenses.  She has some difficulty articulating feelings about husband and states that she is just trying to focus on self for right now.  She is planning to go on a vacation next week with her sisters and is looking forward to this.  Reports strong support from family and friends.  She continues to attend more regularly.   Suicidal/Homicidal: Nowithout intent/plan  Therapist Response: Reviewed symptoms, discussed stressors, facilitated expression of thoughts and feelings, validated feelings, praised and reinforced patient's use of her support system, began to discuss the role of feelings, encouraged patient to maintain positive self-care, praised and reinforced self nurture, began to discuss next steps for treatment.  Plan: Return again in 2 weeks.  Diagnosis: Adjustment disorder with mixed anxiety and depressed mood  Collaboration of Care: Primary Care Provider AEB patient is seeing PCP Dr. [name] for medication management  Patient/Guardian was advised Release of Information must be obtained prior to any record release in order to collaborate their care with an outside provider. Patient/Guardian was advised if they have not already done so to contact the registration department to sign all necessary forms in order for [place] to release information regarding their care.   Consent: Patient/Guardian gives verbal consent for treatment and assignment of benefits for services provided during this visit. Patient/Guardian expressed  understanding and agreed to proceed.   Adah Salvage, LCSW 01/16/2023

## 2023-01-21 ENCOUNTER — Other Ambulatory Visit: Payer: Self-pay | Admitting: Family Medicine

## 2023-01-22 ENCOUNTER — Encounter: Payer: Self-pay | Admitting: "Endocrinology

## 2023-01-22 ENCOUNTER — Ambulatory Visit: Payer: No Typology Code available for payment source | Admitting: "Endocrinology

## 2023-01-22 VITALS — BP 130/62 | HR 64 | Ht 66.0 in | Wt 157.4 lb

## 2023-01-22 DIAGNOSIS — E890 Postprocedural hypothyroidism: Secondary | ICD-10-CM | POA: Diagnosis not present

## 2023-01-22 DIAGNOSIS — E782 Mixed hyperlipidemia: Secondary | ICD-10-CM

## 2023-01-22 DIAGNOSIS — E119 Type 2 diabetes mellitus without complications: Secondary | ICD-10-CM

## 2023-01-22 LAB — POCT GLYCOSYLATED HEMOGLOBIN (HGB A1C): HbA1c, POC (controlled diabetic range): 5.9 % (ref 0.0–7.0)

## 2023-01-22 MED ORDER — LEVOTHYROXINE SODIUM 125 MCG PO TABS: 125.0000 ug | ORAL_TABLET | Freq: Every day | ORAL | 1 refills | Status: DC

## 2023-01-22 MED ORDER — ATORVASTATIN CALCIUM 20 MG PO TABS: 20.0000 mg | ORAL_TABLET | Freq: Every day | ORAL | 1 refills | Status: DC

## 2023-01-22 NOTE — Progress Notes (Signed)
01/22/2023, 2:24 PM  Endocrinology follow-up note   Deanna Bush is a 62 y.o.-year-old female patient being seen for follow-up after she was seen in consultation for hypothyroidism. PMD:  Deanna Perches, MD.   Past Medical History:  Diagnosis Date   Adenocarcinoma of breast    left    Cellulitis of leg, left    Complication of anesthesia    Hard to wake up   Diabetes mellitus without complication    Pt denies   Family history of breast cancer 08/16/2011   Family history of breast cancer    Family history of colon cancer    Family history of kidney cancer    Family history of prostate cancer    Hyperlipidemia    Hypertension    Hypothyroidism    MRSA (methicillin resistant staph aureus) culture positive 08/19/2011   Pre-diabetes     Past Surgical History:  Procedure Laterality Date   BREAST SURGERY Left    mastectomy   CESAREAN SECTION     x2   COLONOSCOPY N/A 03/03/2020   Procedure: COLONOSCOPY;  Surgeon: Corbin Ade, MD;  Location: [facility];  Service: Endoscopy;  Laterality: N/A;  12:00   IR IMAGING GUIDED PORT INSERTION  11/07/2021   left mastectomy     NEPHRECTOMY Right 02/06/2021   Procedure: NEPHRECTOMY- open radical;  Surgeon: Malen Gauze, MD;  Location: [facility];  Service: Urology;  Laterality: Right;   POLYPECTOMY  03/03/2020   Procedure: POLYPECTOMY;  Surgeon: Corbin Ade, MD;  Location: [facility];  Service: Endoscopy;;    Social History   Socioeconomic History   Marital status: Married    Spouse name: Not on file   Number of children: 2   Years of education: Not on file   Highest education level: Not on file  Occupational History   Occupation: CNA  Tobacco Use   Smoking status: Former    Packs/day: 0.50    Years: 18.00    Additional pack years: 0.00    Total pack years: 9.00    Types: Cigarettes    Quit date: 07/01/2002    Years since quitting:  20.5   Smokeless tobacco: Never  Vaping Use   Vaping Use: Never used  Substance and Sexual Activity   Alcohol use: No   Drug use: No   Sexual activity: Yes    Birth control/protection: None  Other Topics Concern   Not on file  Social History Narrative   Not on file   Social Determinants of Health   Financial Resource Strain: Not on file  Food Insecurity: Not on file  Transportation Needs: Not on file  Physical Activity: Not on file  Stress: Not on file  Social Connections: Not on file    Family History  Problem Relation Age of Onset   Alcohol abuse Mother    Hypertension Mother    Diabetes Mother    Hyperlipidemia Mother    Colon cancer Maternal Aunt        dx 90s   Prostate cancer  Maternal Uncle    Throat cancer Maternal Uncle    Kidney cancer Maternal Grandfather    Lung cancer Half-Sister    Kidney cancer Half-Sister 4   Breast cancer Half-Sister 85   Breast cancer Half-Sister 81    Outpatient Encounter Medications as of 01/22/2023  Medication Sig   acetaminophen (TYLENOL) 500 MG tablet Take 500 mg by mouth every 6 (six) hours as needed for moderate pain or headache.   atorvastatin (LIPITOR) 20 MG tablet Take 1 tablet (20 mg total) by mouth daily.   EPINEPHrine 0.3 mg/0.3 mL IJ SOAJ injection Inject 0.3 mg into the muscle as needed for anaphylaxis.   everolimus (AFINITOR) 5 MG tablet Take 1 tablet (5 mg total) by mouth daily.   ezetimibe (ZETIA) 10 MG tablet Take 1 tablet (10 mg total) by mouth daily.   Iron, Ferrous Sulfate, 325 (65 Fe) MG TABS Take 325 mg by mouth daily.   lenvatinib 18 mg daily dose (LENVIMA, 18 MG DAILY DOSE,) 10 MG & 2 x 4 MG capsule Take two 4mg  capsules and one 10mg  capsule by mouth daily (total dose 18mg /day)   levothyroxine (SYNTHROID) 125 MCG tablet Take 1 tablet (125 mcg total) by mouth daily before breakfast.   mirtazapine (REMERON) 7.5 MG tablet TAKE ONE TABLET BY MOUTH ONCE DAILY AT BEDTIME   Multiple Vitamin (MULTIVITAMIN WITH  MINERALS) TABS tablet Take 1 tablet by mouth daily.   olmesartan-hydrochlorothiazide (BENICAR HCT) 40-25 MG tablet Take 1 tablet by mouth daily.   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   urea (CARMOL) 10 % cream Apply topically 2 (two) times daily. Apply twice daily to hands   [DISCONTINUED] atorvastatin (LIPITOR) 10 MG tablet TAKE ONE TABLET BY MOUTH ONCE DAILY   [DISCONTINUED] levothyroxine (SYNTHROID) 112 MCG tablet Take 1 tablet (112 mcg total) by mouth daily.   Facility-Administered Encounter Medications as of 01/22/2023  Medication   heparin lock flush 100 unit/mL   sodium chloride flush (NS) 0.9 % injection 10 mL    ALLERGIES: Allergies  Allergen Reactions   Amlodipine Swelling    Leg edema   Sulfonamide Derivatives Itching   Yellow Jacket Venom [Bee Venom] Rash    urticaria   VACCINATION STATUS: Immunization History  Administered Date(s) Administered   Influenza Split 08/06/2012   Influenza,inj,Quad PF,6+ Mos 07/22/2018, 06/28/2019, 06/28/2020, 07/10/2021   Influenza-Unspecified 07/07/2022   Moderna Sars-Covid-2 Vaccination 12/07/2019, 01/07/2020, 08/09/2020, 04/05/2021   Pneumococcal Conjugate-13 03/29/2015   Pneumococcal Polysaccharide-23 04/18/2021   Tdap 03/12/2011, 07/12/2021   Zoster Recombinat (Shingrix) 05/05/2018, 07/02/2018     HPI    Deanna Bush  is a patient with the above medical history.  Her history starts at approximate age of 44 when she had Graves' disease which required RAI thyroid ablation.  She is currently on levothyroxine 88 mcg p.o. daily before breakfast.      She admits to have been inconsistent taking her statin.  She has no new complaints today. -She recently underwent right nephrectomy for renal cell cell carcinoma.  Her previsit thyroid function tests are consistent with under replacement.      She denies fatigue, palpitations, tremors, heat/cold intolerance. -She  reports fluctuating body weight. -She denies dysphagia, shortness of breath, no voice change.  She underwent thyroid ultrasound recently which did not show significant goiter , nor any discrete thyroid nodules.    she has family history of  thyroid disorders in her  mother, but no family history of thyroid cancer.  -She exercises regularly, non-smoker.  Her medical history also includes hyperlipidemia, type 2 diabetes.  -She has history of diet/exercise controlled type 2 diabetes with recent A1c of 6.6%.       ROS:  Limited as above.   Physical Exam: BP 130/62   Pulse 64   Ht  (1.676 m)   Wt 157 lb 6.4 oz (71.4 kg)   BMI 25.41 kg/m  Wt Readings from Last 3 Encounters:  01/22/23 157 lb 6.4 oz (71.4 kg)  01/16/23 159 lb 9.6 oz (72.4 kg)  01/09/23 163 lb (73.9 kg)    Constitutional:  Body mass index is 25.41 kg/m., not in acute distress, normal state of mind   CMP ( most recent) CMP     Component Value Date/Time   NA 135 12/23/2022 1357   NA 141 10/14/2022 1212   K 3.5 12/23/2022 1357   CL 103 12/23/2022 1357   CO2 25 12/23/2022 1357   GLUCOSE 110 (H) 12/23/2022 1357   BUN 12 12/23/2022 1357   BUN 12 10/14/2022 1212   CREATININE 1.21 (H) 12/23/2022 1357   CREATININE 0.73 04/22/2019 0848   CALCIUM 8.5 (L) 12/23/2022 1357   PROT 6.7 12/23/2022 1357   PROT 6.8 10/14/2022 1212   ALBUMIN 3.1 (L) 12/23/2022 1357   ALBUMIN 3.9 10/14/2022 1212   AST 31 12/23/2022 1357   ALT 22 12/23/2022 1357   ALKPHOS 89 12/23/2022 1357   BILITOT 0.6 12/23/2022 1357   BILITOT 0.3 10/14/2022 1212   GFRNONAA 50 (L) 12/23/2022 1357   GFRNONAA 90 04/22/2019 0848   GFRAA 96 10/30/2020 0914   GFRAA 104 04/22/2019 0848     Diabetic Labs (most recent): Lab Results  Component Value Date   HGBA1C 5.9 01/22/2023   HGBA1C 6.3 07/23/2022   HGBA1C 6.6 03/12/2022   MICROALBUR 0.2 10/23/2018   MICROALBUR <0.2 06/17/2017   MICROALBUR <3.0 (H) 04/03/2016     Lipid Panel ( most  recent) Lipid Panel     Component Value Date/Time   CHOL 281 (H) 01/13/2023 1121   TRIG 137 01/13/2023 1121   HDL 79 01/13/2023 1121   CHOLHDL 3.6 01/13/2023 1121   CHOLHDL 2.7 10/23/2018 1148   VLDL 7 08/12/2016 0913   LDLCALC 178 (H) 01/13/2023 1121   LDLCALC 100 (H) 10/23/2018 1148   LABVLDL 24 01/13/2023 1121       Lab Results  Component Value Date   TSH 57.200 (H) 01/13/2023   TSH 24.400 (H) 06/27/2022   TSH 10.365 (H) 03/18/2022   TSH 6.639 (H) 02/21/2022   TSH 3.486 01/24/2022   TSH 3.706 01/03/2022   TSH 2.455 12/13/2021   TSH 1.281 11/19/2021   TSH 0.980 08/28/2021   TSH 0.657 10/30/2020   FREET4 0.94 01/13/2023   FREET4 1.01 06/27/2022   FREET4 1.25 02/28/2022   FREET4 1.44 08/28/2021   FREET4 1.30 09/04/2020   FREET4 1.1 03/28/2020   FREET4 0.7 (L) 01/03/2020       ASSESSMENT: 1. Hypothyroidism   2.  Type 2 diabetes  3.  Hyperlipidemia  PLAN:  -Her previsit thyroid function tests are consistent with slight under replacement.  I discussed and increase her levothyroxine to 125 mcg p.o. daily before breakfast.     - We discussed about the correct intake of her thyroid hormone, on empty stomach at fasting, with water, separated by at least 30 minutes from breakfast and other medications,  and separated by more  than 4 hours from calcium, iron, multivitamins, acid reflux medications (PPIs). -Patient is made aware of the fact that thyroid hormone replacement is needed for life, dose to be adjusted by periodic monitoring of thyroid function tests.    -Her recent thyroid ultrasound was reviewed, no goiter, no discrete nodules. -She has controlled type 2 diabetes with point-of-care A1c of 5.9% today improving from 6.3%.  She is not on medications.   In light of her concurrent hyperlipidemia, she is still a good candidate for lifestyle medicine. - she acknowledges that there is a room for improvement in her food and drink choices. - Suggestion is made for her  to avoid simple carbohydrates  from her diet including Cakes, Sweet Desserts, Ice Cream, Soda (diet and regular), Sweet Tea, Candies, Chips, Cookies, Store Bought Juices, Alcohol , Artificial Sweeteners,  Coffee Creamer, and "Sugar-free" Products, Lemonade. This will help patient to have more stable blood glucose profile and potentially avoid unintended weight gain.   She promises to continue to do better on her diet and engaging with lifestyle medicine.  She is advised to maintain close follow-up with her PMD Dr. [name].  For her worsening hyperlipidemia with LDL increasing to 178, admittedly has not been consistent taking her atorvastatin, advised to increase atorvastatin to 20 mg p.o. nightly.  She is also on ezetimibe 10 mg p.o. daily.   She will be considered for repeat fasting lipid panel on subsequent visits.  I spent  25  minutes in the care of the patient today including review of labs from Thyroid Function, CMP, and other relevant labs ; imaging/biopsy records (current and previous including abstractions from other facilities); face-to-face time discussing  her lab results and symptoms, medications doses, her options of short and long term treatment based on the latest standards of care / guidelines;   and documenting the encounter.  Bronwen Betters  participated in the discussions, expressed understanding, and voiced agreement with the above plans.  All questions were answered to her satisfaction. she is encouraged to contact clinic should she have any questions or concerns prior to her return visit.    Return in about 6 months (around 07/24/2023) for Fasting Labs  in AM B4 8, A1c -NV.  Marquis Lunch, MD [facility] [address] [place], [place] [address] Phone: [phone]  Fax: [phone]   01/22/2023, 2:24 PM  This note was partially dictated with voice recognition software. Similar sounding words can be transcribed  inadequately or may not  be corrected upon review.

## 2023-01-30 ENCOUNTER — Encounter (HOSPITAL_COMMUNITY): Payer: Self-pay

## 2023-01-30 ENCOUNTER — Ambulatory Visit (INDEPENDENT_AMBULATORY_CARE_PROVIDER_SITE_OTHER): Payer: No Typology Code available for payment source | Admitting: Psychiatry

## 2023-01-30 DIAGNOSIS — F4323 Adjustment disorder with mixed anxiety and depressed mood: Secondary | ICD-10-CM

## 2023-01-30 NOTE — Progress Notes (Signed)
IN-PERSON  THERAPIST PROGRESS NOTE  Session Time: Thursday 01/30/2023 4:06 PM  - 4:58 PM   Participation Level: Active  Behavioral Response: CasualAlert/sad, anxious,tearful at times  Type of Therapy: Individual Therapy  Treatment Goals addressed: Have healthy grieving process around loss Begin verbalizing feelings associated with loss     ProgressTowards Goals: Initial   Interventions: Supportive  Summary: Deanna Bush is a 62 y.o. female who p is referred for services by PCP Dr. Lodema Hong due to pt experiencing symptoms of anxiety along with grief and loss issues. Pt denies any psychiatric hospitalizations. Pt has no previous involvement in outpatient therapy.  Per patient's report her stepdaughter was diagnosed with colon cancer at age 7 and died in 10-Nov-2022.  Patient was her caretaker and says patient died at her home.  Patient reports her husband left her about 2 weeks ago for 64 year old after 30 years of marriage.  Patient reports additional stress regarding providing care for her 20 year old mother.  Patient also has her own health issues as she was diagnosed with kidney cancer 2 years ago and had chemo.  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 She currently is taking experimental drugs.  Patient reports crying spells, sadness, irritability, worry, anxiety, and sleeping difficulty.   Patient last was seen about 2 weeks ago .  She reports enjoying her trip to [place] with her sisters to celebrate one of her sisters birthday.  She reports less worry regarding step granddaughters welfare as patient's son has been maintaining contact with the step granddaughter via Facebook.  Patient also is hopeful about step granddaughter visiting this weekend.  She reports her deceased stepdaughter's birthday is this weekend.  She plans to place flowers on her grave and says the cemetery is right across the street from her home.  She reports decreased financial  stress as her son has moved back into her home and is helping with expenses.  She expresses continued frustration regarding husband who still comes by on Sunday mornings and picks up an outfit for church.  She also reports frustration as husband becomes angry when she tries to discuss financial issues related to the separation.  Patient reports feeling emotionally numb and admits trying to avoid her feelings.  Patient expresses fear of facing her feelings.    Suicidal/Homicidal: Nowithout intent/plan  Therapist Response: Reviewed symptoms, discussed stressors, facilitated expression of thoughts, develop treatment plan, obtained patient's permission to electronically signed plan for patient, provided patient with copy of plan, began to discuss next steps for treatment, began to discuss ways in which patient has avoided dealing with her feelings, began to discuss feelings sometimes associated with dissolution of marriage, assisted patient identify and verbalize feelings of abandonment, rejection, and betrayal   Plan: Return again in 2 weeks.  Diagnosis: Adjustment disorder with mixed anxiety and depressed mood  Collaboration of Care: Primary Care Provider AEB patient is seeing PCP Dr. [name] for medication management  Patient/Guardian was advised Release of Information must be obtained prior to any record release in order to collaborate their care with an outside provider. Patient/Guardian was advised if they have not already done so to contact the registration department to sign all necessary forms in order for [place] to release information regarding their care.   Consent: Patient/Guardian gives verbal consent for treatment and assignment of benefits for services provided during this visit. Patient/Guardian expressed understanding and agreed to proceed.   Adah Salvage, LCSW 01/30/2023  Active     OP Depression - - ruminating thoughts, tearfulness, avoidance of feelings     LTG: Have  healthy grieving process around loss      Start:  01/30/23    Expected End:  07/30/23         STG: Begin verbalizing feelings associated with loss     Start:  01/30/23    Expected End:  07/30/23

## 2023-01-31 ENCOUNTER — Ambulatory Visit: Payer: No Typology Code available for payment source | Admitting: Family Medicine

## 2023-02-06 ENCOUNTER — Encounter: Payer: Self-pay | Admitting: Family Medicine

## 2023-02-06 ENCOUNTER — Ambulatory Visit (INDEPENDENT_AMBULATORY_CARE_PROVIDER_SITE_OTHER): Payer: No Typology Code available for payment source | Admitting: Family Medicine

## 2023-02-06 VITALS — BP 133/75 | HR 67 | Ht 66.0 in | Wt 156.0 lb

## 2023-02-06 DIAGNOSIS — I10 Essential (primary) hypertension: Secondary | ICD-10-CM

## 2023-02-06 DIAGNOSIS — F320 Major depressive disorder, single episode, mild: Secondary | ICD-10-CM

## 2023-02-06 DIAGNOSIS — E782 Mixed hyperlipidemia: Secondary | ICD-10-CM

## 2023-02-06 DIAGNOSIS — E890 Postprocedural hypothyroidism: Secondary | ICD-10-CM

## 2023-02-06 MED ORDER — OLMESARTAN MEDOXOMIL-HCTZ 40-25 MG PO TABS: 1.0000 | ORAL_TABLET | Freq: Every day | ORAL | 3 refills | Status: DC

## 2023-02-06 NOTE — Patient Instructions (Addendum)
F/U early November, call if you need me sooner  No med changes   Keep doing what you are doing  Thanks for choosing [facility], we consider it a privelige to serve you.

## 2023-02-09 ENCOUNTER — Encounter: Payer: Self-pay | Admitting: Family Medicine

## 2023-02-09 NOTE — Assessment & Plan Note (Signed)
Controlled, no change in medication DASH diet and commitment to daily physical activity for a minimum of 30 minutes discussed and encouraged, as a part of hypertension management. The importance of attaining a healthy weight is also discussed.     02/06/2023   10:30 AM 01/22/2023    8:22 AM 01/16/2023    8:33 AM 01/09/2023   10:47 AM 01/09/2023   10:46 AM 01/07/2023    3:13 PM 12/23/2022    2:09 PM  BP/Weight  Systolic BP 133 130 132 162 166 188 164  Diastolic BP 75 62 78 82 85 82 83  Wt. (Lbs) 156.04 157.4 159.6  163 166.2 165.8  BMI 25.19 kg/m2 25.41 kg/m2 25.76 kg/m2  26.31 kg/m2 26.83 kg/m2 26.76 kg/m2

## 2023-02-09 NOTE — Assessment & Plan Note (Signed)
Being managed by Endo

## 2023-02-09 NOTE — Assessment & Plan Note (Addendum)
Improved with therapy, continue Remeron daily

## 2023-02-09 NOTE — Assessment & Plan Note (Signed)
Hyperlipidemia:Low fat diet discussed and encouraged.   Lipid Panel  Lab Results  Component Value Date   CHOL 281 (H) 01/13/2023   HDL 79 01/13/2023   LDLCALC 178 (H) 01/13/2023   TRIG 137 01/13/2023   CHOLHDL 3.6 01/13/2023     Endo has put pt back on statin and she has d/c zetia, endo to rept lab in 6 months, will have hepatic panel checked in the interim by Oncology

## 2023-02-09 NOTE — Progress Notes (Signed)
   Deanna Bush     MRN: [id number]      DOB: [birth year]  Chief Complaint  Patient presents with   Hypertension    5 week f/u, pt reports doing well bp readings at home have been good, has been walking 2-3 days a week.     HPI Deanna Bush is here for follow up and re-evaluation of chronic medical conditions, medication management and review of any available recent lab and radiology data.  Preventive health is updated, specifically  Cancer screening and Immunization.   Questions or concerns regarding consultations or procedures which the PT has had in the interim are  addressed. The PT denies any adverse reactions to current medications since the last visit.  There are no new concerns.  There are no specific complaints   ROS Denies recent fever or chills. Denies sinus pressure, nasal congestion, ear pain or sore throat. Denies chest congestion, productive cough or wheezing. Denies chest pains, palpitations and leg swelling Denies abdominal pain, nausea, vomiting,diarrhea or constipation.   Denies dysuria, frequency, hesitancy or incontinence. Denies joint pain, swelling and limitation in mobility. Denies headaches, seizures, numbness, or tingling. Denies uncontrolled  depression, anxiety or insomnia. Denies skin break down or rash.   PE  BP 133/75   Pulse 67   Ht 5\' 6"  (1.676 m)   Wt 156 lb 0.6 oz (70.8 kg)   SpO2 98%   BMI 25.19 kg/m   Patient alert and oriented and in no cardiopulmonary distress.  HEENT: No facial asymmetry, EOMI,     Neck supple .  Chest: Clear to auscultation bilaterally.  CVS: S1, S2 no murmurs, no S3.Regular rate.  ABD: Soft non tender.   Ext: No edema  MS: Adequate ROM spine, shoulders, hips and knees.  Skin: Intact, no ulcerations or rash noted.  Psych: Good eye contact, normal affect. Memory intact not anxious or depressed appearing.  CNS: CN 2-12 intact, power,  normal throughout.no focal deficits noted.   Assessment & Plan  Primary  hypertension Controlled, no change in medication DASH diet and commitment to daily physical activity for a minimum of 30 minutes discussed and encouraged, as a part of hypertension management. The importance of attaining a healthy weight is also discussed.     02/06/2023   10:30 AM 01/22/2023    8:22 AM 01/16/2023    8:33 AM 01/09/2023   10:47 AM 01/09/2023   10:46 AM 01/07/2023    3:13 PM 12/23/2022    2:09 PM  BP/Weight  Systolic BP 133 130 132 162 166 188 164  Diastolic BP 75 62 78 82 85 82 83  Wt. (Lbs) 156.04 157.4 159.6  163 166.2 165.8  BMI 25.19 kg/m2 25.41 kg/m2 25.76 kg/m2  26.31 kg/m2 26.83 kg/m2 26.76 kg/m2       Mixed hyperlipidemia Hyperlipidemia:Low fat diet discussed and encouraged.   Lipid Panel  Lab Results  Component Value Date   CHOL 281 (H) 01/13/2023   HDL 79 01/13/2023   LDLCALC 178 (H) 01/13/2023   TRIG 137 01/13/2023   CHOLHDL 3.6 01/13/2023     Endo has put pt back on statin and she has d/c zetia, endo to rept lab in 6 months, will have hepatic panel checked in the interim by Oncology  Hypothyroidism following radioiodine therapy Being managed by Endo  Depression, major, single episode, mild (HCC) Improved with therapy, continue Remeron daily

## 2023-02-13 ENCOUNTER — Ambulatory Visit (INDEPENDENT_AMBULATORY_CARE_PROVIDER_SITE_OTHER): Payer: No Typology Code available for payment source | Admitting: Psychiatry

## 2023-02-13 DIAGNOSIS — F4323 Adjustment disorder with mixed anxiety and depressed mood: Secondary | ICD-10-CM | POA: Diagnosis not present

## 2023-02-13 NOTE — Progress Notes (Signed)
IN-PERSON  THERAPIST PROGRESS NOTE  Session Time: Thursday 02/13/2023 4:10 PM -  4:55 PM      Participation Level: Active  Behavioral Response: CasualAlert/sad, anxious,tearful at times  Type of Therapy: Individual Therapy  Treatment Goals addressed: Have healthy grieving process around loss Begin verbalizing feelings associated with loss     ProgressTowards Goals: Progressing  Interventions: Supportive  Summary: Deanna Bush is a 62 y.o. female who p is referred for services by PCP Dr. Lodema Bush due to pt experiencing symptoms of anxiety along with grief and loss issues. Pt denies any psychiatric hospitalizations. Pt has no previous involvement in outpatient therapy.  Per patient's report her stepdaughter was diagnosed with colon cancer at age 16 and died in 10-26-22.  Patient was her caretaker and says patient died at her home.  Patient reports her husband left her about 2 weeks ago for 71 year old after 30 years of marriage.  Patient reports additional stress regarding providing care for her 33 year old mother.  Patient also has her own health issues as she was diagnosed with kidney cancer 2 years ago and had chemo.  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 She currently is taking experimental drugs.  Patient reports crying spells, sadness, irritability, worry, anxiety, and sleeping difficulty.   Patient last was seen about 2 weeks ago .  She reports continued grief and loss issues along with transition issues related to the death of her daughter and the dissolution of her marriage.  She is pleased that she is starting to feel better physically and she has resumed a normal appetite.  She continues to attend work regularly, attending church, and socialize with her family.  She is pleased she saw her granddaughter who came to visit about 2 weeks ago.  Per patient's report she and her family along with the granddaughter went to patient's daughters placed flowers  on her daughter's grave and released balloons.  They later had a family dinner.  Patient is excited as she learned her granddaughter and her guardian are planning to move back to this area at the end of the school year.  Patient continues to express frustration, confusion, and sadness regarding the dissolution of her marriage.  She also expresses fear as she is trying to adjust to not being in a relationship with her husband to whom she was married for 30 years.  Per patient report, her husband was her best friend.  She also reports loss regarding the relationships she and her husband had with other people as a couple.  Patient reports avoiding contact.   Suicidal/Homicidal: Nowithout intent/plan  Therapist Response: Reviewed symptoms, discussed stressors, facilitated expression of thoughts, validated and normalized feelings related to grief and loss, praised and reinforced patient's efforts to use helpful coping strategies to cope with the birthday of her deceased daughter, assisted patient began to identify losses she has incurred with the dissolution of her marriage, assisted patient identify the way her life has changed since her husband left, provided patient with handout on stages of divorce and developed plan with patient to read in preparation for next session, praised and reinforced patient's efforts to improve self-care    Plan: Return again in 2 weeks.  Diagnosis: Adjustment disorder with mixed anxiety and depressed mood  Collaboration of Care: Primary Care Provider AEB patient is seeing PCP Deanna Bush for medication management  Patient/Guardian was advised Release of Information must be obtained prior to any record release in  order to collaborate their care with an outside provider. Patient/Guardian was advised if they have not already done so to contact the registration department to sign all necessary forms in order for [place] to release information regarding their care.   Consent:  Patient/Guardian gives verbal consent for treatment and assignment of benefits for services provided during this visit. Patient/Guardian expressed understanding and agreed to proceed.   Deanna Salvage, LCSW 02/13/2023

## 2023-02-17 IMAGING — CT CT RENAL STONE PROTOCOL
2 of 4 series · 15 of 46 positions shown, 17 images · non-contrast
Comparison: None.

CLINICAL DATA: 59-year-old female with flank pain. Concern for
kidney stone.

EXAM:
CT ABDOMEN AND PELVIS WITHOUT CONTRAST
TECHNIQUE: Multidetector CT imaging of the abdomen and pelvis was performed
following the standard protocol without IV contrast.

[Series 2: axial st · axial · 0.78mm/px · z∈[+748,+1193]mm · 12 of 101 slices shown, 14 images]
[im 6/101  soft-tissue]
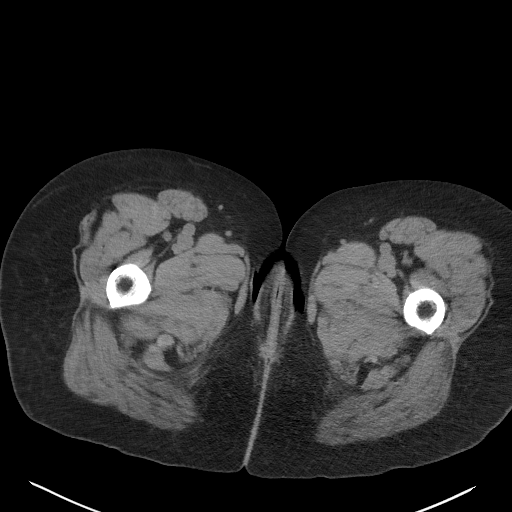
[im 6/101  bone]
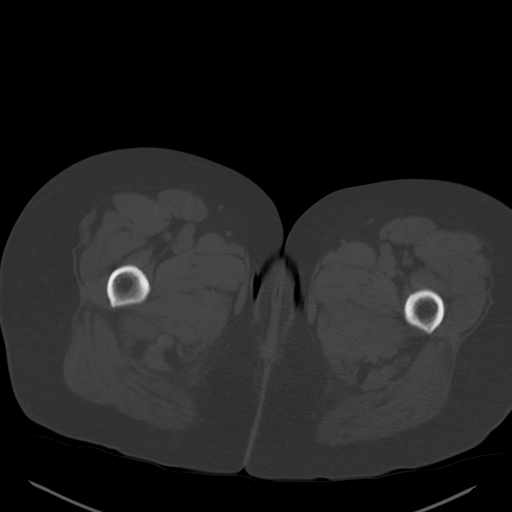
[im 17/101  soft-tissue]
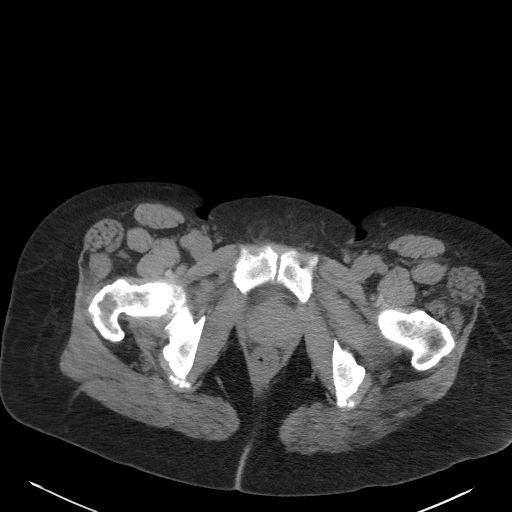
[im 23/101  soft-tissue]
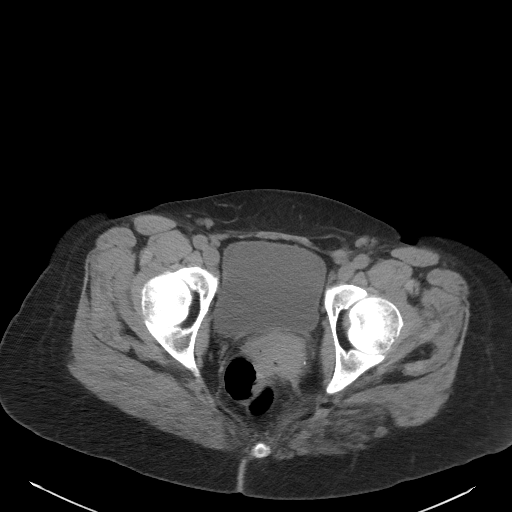
[im 28/101  soft-tissue]
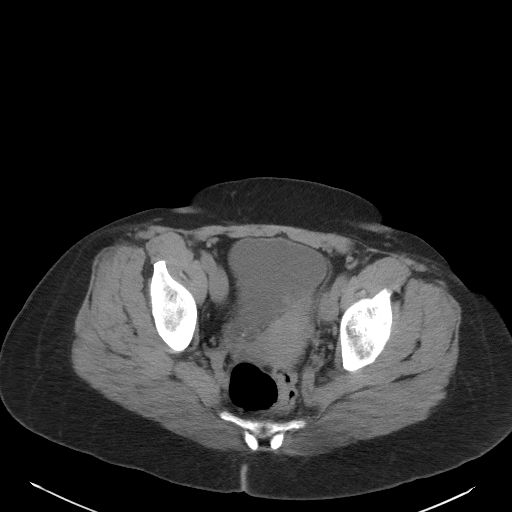
[im 39/101  soft-tissue]
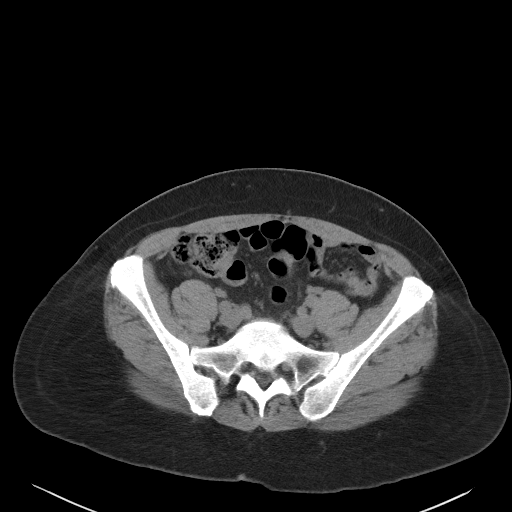
[im 45/101  soft-tissue]
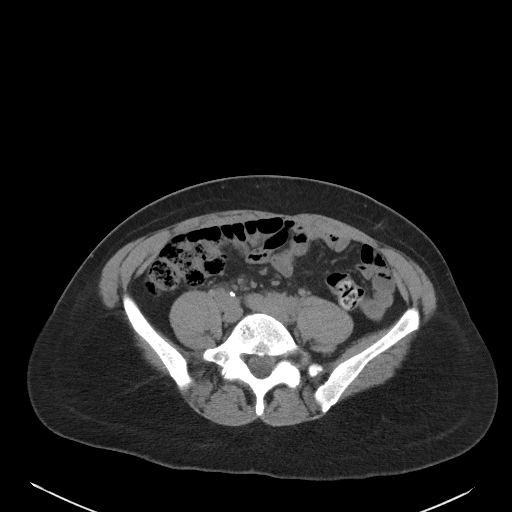
[im 56/101  soft-tissue]
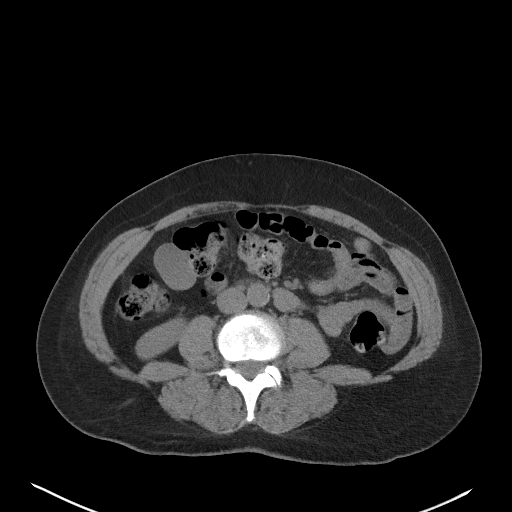
[im 62/101  soft-tissue]
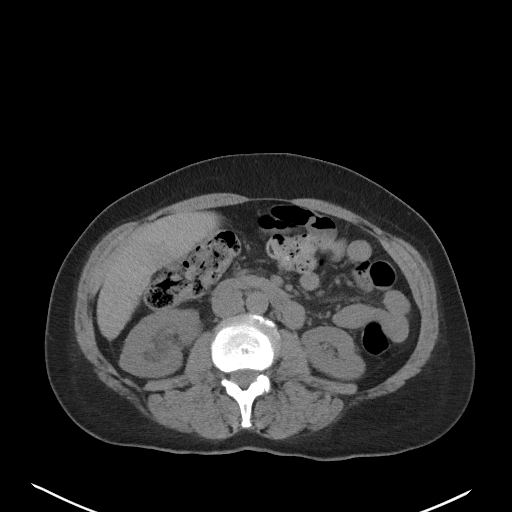
[im 73/101  soft-tissue]
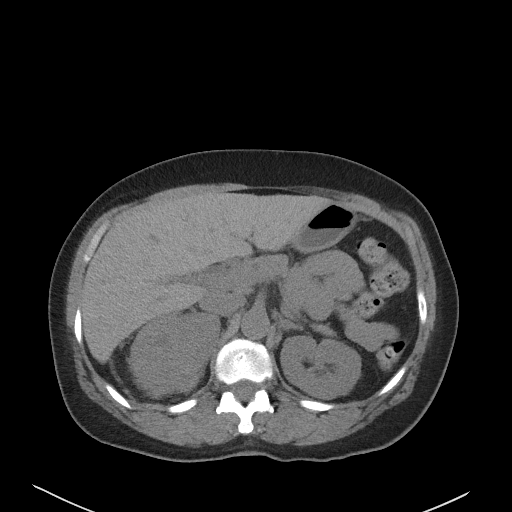
[im 73/101  bone]
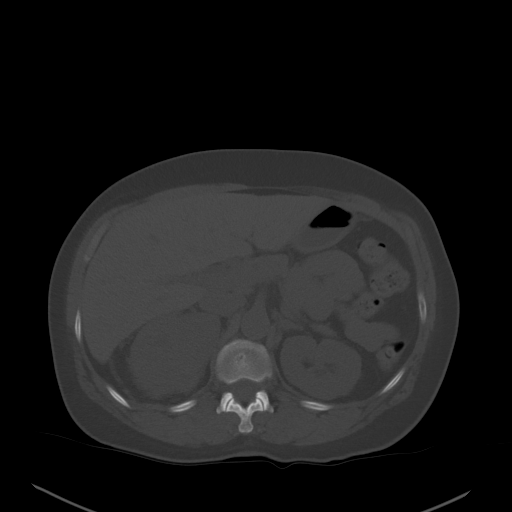
[im 78/101  soft-tissue]
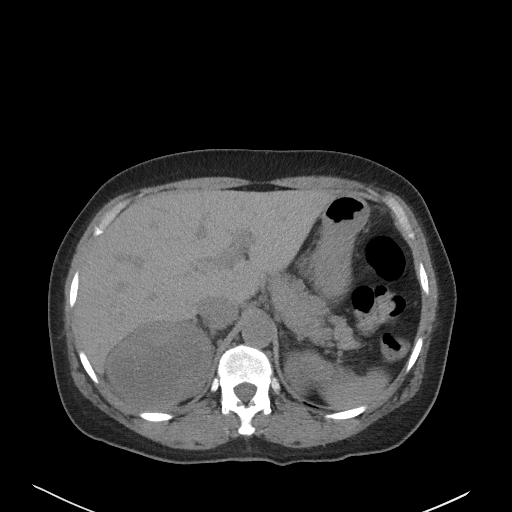
[im 84/101  soft-tissue]
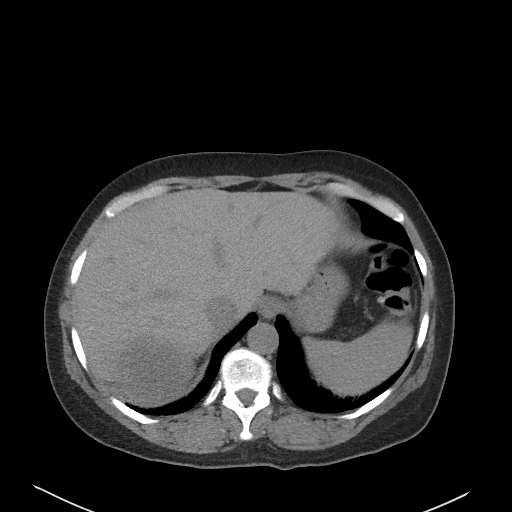
[im 95/101  soft-tissue]
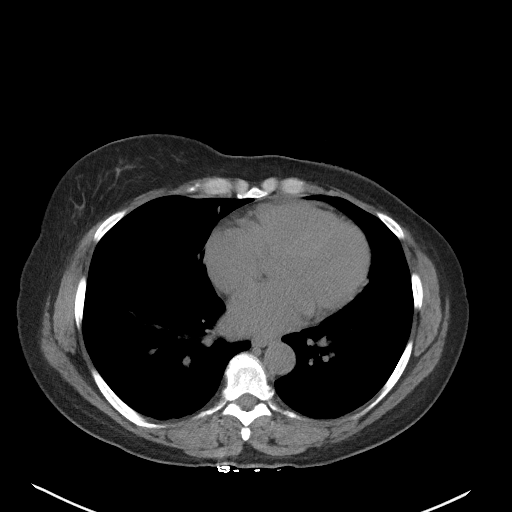

[Series 5: coronal st · coronal · 0.69mm/px · 3 of 86 slices shown]
[im 29/86  soft-tissue]
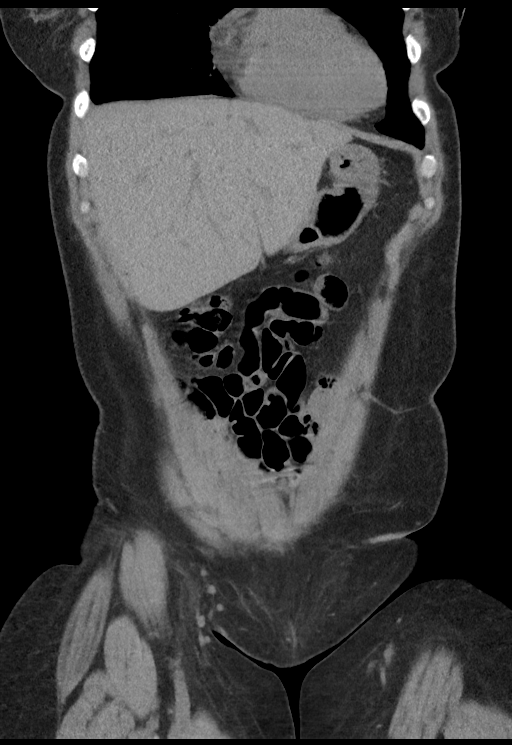
[im 38/86  soft-tissue]
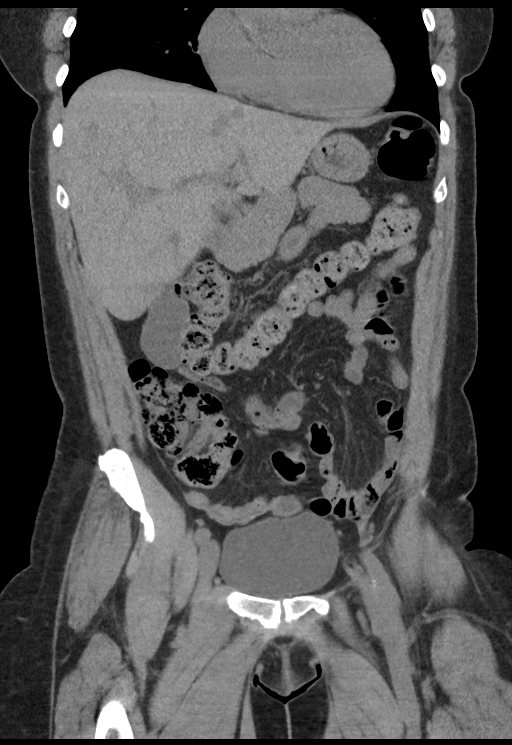
[im 48/86  soft-tissue]
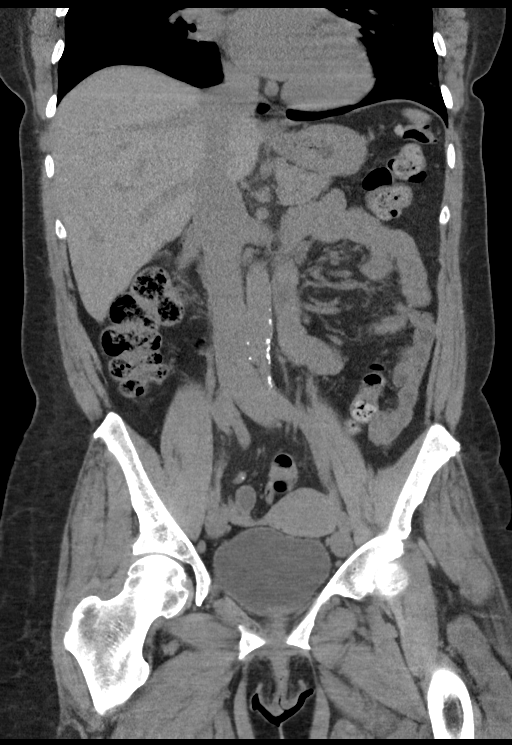

[15 of 46 positions shown; findings below may reference images not displayed]

FINDINGS: Evaluation of this exam is limited in the absence of intravenous
contrast.

Lower chest: Bibasilar linear atelectasis/scarring. There is a 3 mm
right lower lobe nodule ([DATE]). The visualized lung bases are
otherwise clear. There is hypoattenuation of the cardiac blood pool
suggestive of anemia. Clinical correlation is recommended.

No intra-abdominal free air or free fluid.

Hepatobiliary: Apparent infiltration of the right renal mass into
the posterior right lobe of the liver (segment VII). The liver is
otherwise unremarkable. No intrahepatic biliary dilatation. No
calcified gallstone or pericholecystic fluid.

Pancreas: Unremarkable. No pancreatic ductal dilatation or
surrounding inflammatory changes.

Spleen: Normal in size without focal abnormality.

Adrenals/Urinary Tract: The adrenal glands unremarkable. There is a
solid predominantly hypoattenuating right renal upper pole mass
measuring approximately 6 x 9 cm in greatest axial dimensions and 11
cm in craniocaudal length most consistent with malignancy. Further
characterization with renal mass protocol MRI on a
nonemergent/outpatient basis recommended. There is superior
extension of the mass with apparent invasion into the right lobe of
the liver. There is probable extension of the mass into the right
renal pelvis with a degree of obstruction and mild right
hydronephrosis. There is no hydronephrosis or nephrolithiasis on the
left. The visualized ureters and urinary bladder appear
unremarkable.

Stomach/Bowel: There is moderate stool throughout the colon. No
bowel obstruction or active inflammation. The appendix is normal.

Vascular/Lymphatic: Mild aortoiliac atherosclerotic disease. The IVC
is unremarkable. Evaluation for possible tumor thrombus is limited
on this noncontrast CT. CT with IV contrast may provide better
evaluation. No portal venous gas. No adenopathy.

Reproductive: The uterus is anteverted and grossly unremarkable. No
adnexal masses.

Other: Left mastectomy with partially visualized prosthesis.

Musculoskeletal: Degenerative changes of the spine. No acute osseous
pathology.
IMPRESSION: 1. Large right renal upper pole mass most consistent with
malignancy. CT with IV contrast may provide better evaluation of
potential tumor thrombus.
2. Mild right hydronephrosis secondary to extension of the mass into
the right renal pelvis.
3. No bowel obstruction. Normal appendix.
4. A 3 mm right lower lobe nodule.
5. Aortic Atherosclerosis ([id number]).

## 2023-02-28 ENCOUNTER — Inpatient Hospital Stay: Payer: PRIVATE HEALTH INSURANCE | Attending: Hematology

## 2023-02-28 VITALS — BP 138/68 | HR 62 | Temp 97.1°F | Resp 18 | Wt 153.6 lb

## 2023-02-28 DIAGNOSIS — Z8051 Family history of malignant neoplasm of kidney: Secondary | ICD-10-CM | POA: Insufficient documentation

## 2023-02-28 DIAGNOSIS — Z801 Family history of malignant neoplasm of trachea, bronchus and lung: Secondary | ICD-10-CM | POA: Diagnosis not present

## 2023-02-28 DIAGNOSIS — Z8042 Family history of malignant neoplasm of prostate: Secondary | ICD-10-CM | POA: Insufficient documentation

## 2023-02-28 DIAGNOSIS — Z803 Family history of malignant neoplasm of breast: Secondary | ICD-10-CM | POA: Insufficient documentation

## 2023-02-28 DIAGNOSIS — C641 Malignant neoplasm of right kidney, except renal pelvis: Secondary | ICD-10-CM | POA: Diagnosis present

## 2023-02-28 DIAGNOSIS — C786 Secondary malignant neoplasm of retroperitoneum and peritoneum: Secondary | ICD-10-CM | POA: Insufficient documentation

## 2023-02-28 DIAGNOSIS — Z853 Personal history of malignant neoplasm of breast: Secondary | ICD-10-CM | POA: Diagnosis not present

## 2023-02-28 DIAGNOSIS — D509 Iron deficiency anemia, unspecified: Secondary | ICD-10-CM | POA: Insufficient documentation

## 2023-02-28 DIAGNOSIS — Z87891 Personal history of nicotine dependence: Secondary | ICD-10-CM | POA: Diagnosis not present

## 2023-02-28 DIAGNOSIS — C50912 Malignant neoplasm of unspecified site of left female breast: Secondary | ICD-10-CM

## 2023-02-28 DIAGNOSIS — Z800 Family history of malignant neoplasm of digestive organs: Secondary | ICD-10-CM | POA: Diagnosis not present

## 2023-02-28 DIAGNOSIS — E039 Hypothyroidism, unspecified: Secondary | ICD-10-CM | POA: Insufficient documentation

## 2023-02-28 DIAGNOSIS — N189 Chronic kidney disease, unspecified: Secondary | ICD-10-CM | POA: Diagnosis not present

## 2023-02-28 DIAGNOSIS — D649 Anemia, unspecified: Secondary | ICD-10-CM

## 2023-02-28 DIAGNOSIS — D631 Anemia in chronic kidney disease: Secondary | ICD-10-CM | POA: Diagnosis not present

## 2023-02-28 DIAGNOSIS — I129 Hypertensive chronic kidney disease with stage 1 through stage 4 chronic kidney disease, or unspecified chronic kidney disease: Secondary | ICD-10-CM | POA: Diagnosis not present

## 2023-02-28 DIAGNOSIS — Z95828 Presence of other vascular implants and grafts: Secondary | ICD-10-CM

## 2023-02-28 LAB — IRON AND TIBC
Iron: 44 ug/dL (ref 28–170)
Saturation Ratios: 16 % (ref 10.4–31.8)
TIBC: 280 ug/dL (ref 250–450)
UIBC: 236 ug/dL

## 2023-02-28 LAB — CBC WITH DIFFERENTIAL/PLATELET
Abs Immature Granulocytes: 0 10*3/uL (ref 0.00–0.07)
Basophils Absolute: 0 10*3/uL (ref 0.0–0.1)
Basophils Relative: 0 %
Eosinophils Absolute: 0 10*3/uL (ref 0.0–0.5)
Eosinophils Relative: 1 %
HCT: 35.3 % — ABNORMAL LOW (ref 36.0–46.0)
Hemoglobin: 11.2 g/dL — ABNORMAL LOW (ref 12.0–15.0)
Immature Granulocytes: 0 %
Lymphocytes Relative: 38 %
Lymphs Abs: 1.7 10*3/uL (ref 0.7–4.0)
MCH: 29 pg (ref 26.0–34.0)
MCHC: 31.7 g/dL (ref 30.0–36.0)
MCV: 91.5 fL (ref 80.0–100.0)
Monocytes Absolute: 0.3 10*3/uL (ref 0.1–1.0)
Monocytes Relative: 5 %
Neutro Abs: 2.6 10*3/uL (ref 1.7–7.7)
Neutrophils Relative %: 56 %
Platelets: 182 10*3/uL (ref 150–400)
RBC: 3.86 MIL/uL — ABNORMAL LOW (ref 3.87–5.11)
RDW: 13.4 % (ref 11.5–15.5)
WBC: 4.6 10*3/uL (ref 4.0–10.5)
nRBC: 0 % (ref 0.0–0.2)

## 2023-02-28 LAB — COMPREHENSIVE METABOLIC PANEL
ALT: 20 U/L (ref 0–44)
AST: 24 U/L (ref 15–41)
Albumin: 3.1 g/dL — ABNORMAL LOW (ref 3.5–5.0)
Alkaline Phosphatase: 74 U/L (ref 38–126)
Anion gap: 8 (ref 5–15)
BUN: 21 mg/dL (ref 8–23)
CO2: 27 mmol/L (ref 22–32)
Calcium: 8.8 mg/dL — ABNORMAL LOW (ref 8.9–10.3)
Chloride: 99 mmol/L (ref 98–111)
Creatinine, Ser: 1.16 mg/dL — ABNORMAL HIGH (ref 0.44–1.00)
GFR, Estimated: 53 mL/min — ABNORMAL LOW (ref >60)
Glucose, Bld: 99 mg/dL (ref 70–99)
Potassium: 3.6 mmol/L (ref 3.5–5.1)
Sodium: 134 mmol/L — ABNORMAL LOW (ref 135–145)
Total Bilirubin: 0.5 mg/dL (ref 0.3–1.2)
Total Protein: 6.8 g/dL (ref 6.5–8.1)

## 2023-02-28 LAB — FERRITIN: Ferritin: 85 ng/mL (ref 11–307)

## 2023-02-28 LAB — MAGNESIUM: Magnesium: 1.7 mg/dL (ref 1.7–2.4)

## 2023-02-28 MED ORDER — HEPARIN SOD (PORK) LOCK FLUSH 100 UNIT/ML IV SOLN
500.0000 [IU] | Freq: Once | INTRAVENOUS | Status: AC
  Administered 2023-02-28: 500 [IU] via INTRAVENOUS

## 2023-02-28 MED ORDER — SODIUM CHLORIDE 0.9% FLUSH
10.0000 mL | INTRAVENOUS | Status: DC | PRN
  Administered 2023-02-28: 10 mL via INTRAVENOUS

## 2023-02-28 NOTE — Progress Notes (Signed)
Patients port flushed without difficulty.  No blood return noted with no bruising or swelling noted at site. Labs drawn peripherally. Band aid applied.  VSS with discharge and left in satisfactory condition with no s/s of distress noted.

## 2023-03-03 NOTE — Progress Notes (Signed)
[facility] [address], [place] [address]    Clinic Day:  03/04/2023  Referring physician: Kerri Perches, MD  Patient Care Team: Kerri Perches, MD as PCP - General Mallipeddi, Orion Modest, MD as PCP - Cardiology (Cardiology) Doreatha Massed, MD as Medical Oncologist (Medical Oncology)   ASSESSMENT & PLAN:   Assessment: 1. Metastatic clear-cell renal cell carcinoma: - Right radical nephrectomy on 02/06/2021. - Pathology shows clear-cell RCC, grade 4, 12.5 cm, necrosis and rhabdoid features are identified.  Tumor extends into the and invades the wall of the vena cava (pT3c).  Vascular, ureteral and all margins of resection are negative for tumor. - CT scan abdomen with and without contrast on 05/29/2021 showed several nodules along the peritoneal surface of the right nephrectomy bed warranting close attention. - CTAP with and without contrast on 09/18/2021 showed progressive nodularity within the right nephrectomy bed, with enlarged nodules adjacent to the right hepatic lobe extending inferiorly along the right retroperitoneum with multiple enlarged enhancing nodules.  Direct metastatic invasion into the right hepatic lobe.  Small nodule at the right lung base favored to be benign. - IMDC: Intermediate risk group with 2 features including diagnosis less than 1 year and hemoglobin lower than normal. - Bone scan on 10/05/2021 with focal activity in the mental vertex of the mandible which could be inflammatory/traumatic/metastatic. - CT chest on 10/03/2021 showed several lung nodules scattered bilaterally some of which could be metastatic and many others could be due to mucoid impaction. - NGS testing did not show any targetable mutations.  TMB was low.  CCND3 amplified.  PBRM1 VUS present. - Right nephrectomy bed biopsy (10/22/2021): Clear-cell renal cell carcinoma with necrosis - 4 cycles of ipilimumab and nivolumab from 11/19/2021 through 01/24/2022 - CT CAP  on 02/14/2022: Numerous new and enlarged lung nodules.  Significant interval increase in the mass in the right renal bed.  New hypodense liver lesions. - Lenvatinib (12 mg) and everolimus 5 mg daily started on 03/13/2022   2. Social/family history: - She lives at home with her husband and also takes care of her mother. - She works as a Child psychotherapist at [employer].  She quit smoking in 2004 and smoked half pack per day for 10 years. - Half sister (same mother) had kidney cancer at age 2.  Maternal grandfather had kidney cancer.  Sister died of lung cancer.  2 half sisters (same father) had breast cancers.  3.  Left breast stage I tubular adenocarcinoma: - She underwent left mastectomy on 06/11/2002.  0/6 lymph nodes positive.  ER 90%, PR 92%, Ki-67 6%, HER2 negative.  As per chart, declined antiestrogen therapy.    Plan: 1. Metastatic clear-cell RCC, intermediate risk by IMDC: - CT CAP on 01/01/2023: Mildly improved disease in the chest.  Stable disease in the abdomen and pelvis and stable liver lesions and retroperitoneal metastasis. - She is tolerating lenvatinib and everolimus reasonably well. - She had developed diarrhea a month ago, once per day.  1 Imodium tablet helps.  Last 2 days diarrhea has been better. - She lost about 4 pounds in the last 6 weeks.  She also started walking 2 to 3 miles, 3 to 4 days/week which is new. - Reviewed labs from 02/28/2023: Normal LFTs with albumin low at 3.1.  Creatinine 1.16 and stable.  White count and platelet count is normal. - We have checked a UA which showed protein was 100.  No significant changes.  Will continue to monitor UA for proteinuria. - Continue lenvatinib 18 mg daily and everolimus 5 mg daily. - RTC 8 weeks for follow-up.  Will repeat CT CAP with contrast and labs prior to next visit.  2.  Normocytic anemia: - Normocytic anemia from CKD, functional iron deficiency and myelosuppression. - Continue iron tablet daily.  Hemoglobin  today improved to 11.2.  Ferritin is 85 and percent saturation 16.  3.  Hypothyroidism: - Continue Synthroid 125 mcg daily, managed by Dr. Fransico Him.   4.  Hypertension: - Continue olmesartan/HCTZ 40/25 mg daily.  Blood pressure is well-controlled at 137/68.    Orders Placed This Encounter  Procedures   CT CHEST ABDOMEN PELVIS W CONTRAST    Standing Status:   Future    Standing Expiration Date:   03/03/2024    Order Specific Question:   If indicated for the ordered procedure, I authorize the administration of contrast media per Radiology protocol    Answer:   Yes    Order Specific Question:   Does the patient have a contrast media/X-ray dye allergy?    Answer:   No    Order Specific Question:   Preferred imaging location?    Answer:   [facility]    Order Specific Question:   If indicated for the ordered procedure, I authorize the administration of oral contrast media per Radiology protocol    Answer:   Yes   Urinalysis, dipstick only    Standing Status:   Future    Number of Occurrences:   1    Standing Expiration Date:   03/03/2024   CBC with Differential    Standing Status:   Future    Standing Expiration Date:   03/03/2024   Comprehensive metabolic panel    Standing Status:   Future    Standing Expiration Date:   03/03/2024   Iron and TIBC ([facility] ONLY)    Standing Status:   Future    Standing Expiration Date:   03/03/2024   Ferritin    Standing Status:   Future    Standing Expiration Date:   03/03/2024      I,Katie Daubenspeck,acting as a scribe for Doreatha Massed, MD.,have documented all relevant documentation on the behalf of Doreatha Massed, MD,as directed by  Doreatha Massed, MD while in the presence of Doreatha Massed, MD.   I, Doreatha Massed MD, have reviewed the above documentation for accuracy and completeness, and I agree with the above.   Doreatha Massed, MD   5/28/20245:19 PM  CHIEF COMPLAINT:    Diagnosis: metastatic clear-cell renal cell carcinoma    Cancer Staging  Malignant neoplasm of female breast [facility]) Staging form: Breast, AJCC 6th Edition - Clinical: Stage I (T1b, N0, M0) - Signed by Ellouise Newer, PA on 08/16/2011  Renal cell cancer [facility]) Staging form: Kidney, AJCC 8th Edition - Clinical stage from 09/26/2021: Stage IV (cT3c, cNX, cM1) - Unsigned    Prior Therapy: Nivolumab plus ipilimumab for 4 cycles with progression   Current Therapy:  Lenvatinib and everolimus    HISTORY OF PRESENT ILLNESS:   Oncology History  Renal cell cancer (HCC)  06/20/2021 Initial Diagnosis   Renal cell carcinoma of right kidney (HCC)   11/19/2021 - 01/24/2022 Chemotherapy   Patient is on Treatment Plan : RENAL CELL CARCINOMA Nivolumab + Ipilimumab q21d / Nivolumab q28d      Genetic Testing   Negative genetic testing. No pathogenic variants identified on the [facility] Multi-Cancer Panel + RNA.  VUS in PMS2 called c.1732C>A and in RECQL4 called c.2967G>A  Identified. The report date is 11/01/2021.   The Multi-Cancer Panel + RNA offered by [facility] includes sequencing and/or deletion duplication testing of the following 84 genes: AIP, ALK, APC, ATM, AXIN2,BAP1,  BARD1, BLM, BMPR1A, BRCA1, BRCA2, BRIP1, CASR, CDC73, CDH1, CDK4, CDKN1B, CDKN1C, CDKN2A (p14ARF), CDKN2A (p16INK4a), CEBPA, CHEK2, CTNNA1, DICER1, DIS3L2, EGFR (c.2369C>T, p.Thr790Met variant only), EPCAM (Deletion/duplication testing only), FH, FLCN, GATA2, GPC3, GREM1 (Promoter region deletion/duplication testing only), HOXB13 (c.251G>A, p.Gly84Glu), HRAS, KIT, MAX, MEN1, MET, MITF (c.952G>A, p.Glu318Lys variant only), MLH1, MSH2, MSH3, MSH6, MUTYH, NBN, NF1, NF2, NTHL1, PALB2, PDGFRA, PHOX2B, PMS2, POLD1, POLE, POT1, PRKAR1A, PTCH1, PTEN, RAD50, RAD51C, RAD51D, RB1, RECQL4, RET, RUNX1, SDHAF2, SDHA (sequence changes only), SDHB, SDHC, SDHD, SMAD4, SMARCA4, SMARCB1, SMARCE1, STK11, SUFU, TERC, TERT, TMEM127, TP53, TSC1, TSC2, VHL, WRN  and WT1.       INTERVAL HISTORY:   Deanna Bush is a 62 y.o. female presenting to clinic today for follow up of metastatic clear-cell renal cell carcinoma. She was last seen by me on 01/07/23.  Today, she states that she is doing well overall. Her appetite level is at 75%. Her energy level is at 100%.  PAST MEDICAL HISTORY:   Past Medical History: Past Medical History:  Diagnosis Date   Adenocarcinoma of breast (HCC)    left    Cellulitis of leg, left    Complication of anesthesia    Hard to wake up   Diabetes mellitus without complication (HCC)    Pt denies   Family history of breast cancer 08/16/2011   Family history of breast cancer    Family history of colon cancer    Family history of kidney cancer    Family history of prostate cancer    Hyperlipidemia    Hypertension    Hypothyroidism    MRSA (methicillin resistant staph aureus) culture positive 08/19/2011   Pre-diabetes     Surgical History: Past Surgical History:  Procedure Laterality Date   BREAST SURGERY Left    mastectomy   CESAREAN SECTION     x2   COLONOSCOPY N/A 03/03/2020   Procedure: COLONOSCOPY;  Surgeon: Corbin Ade, MD;  Location: [facility];  Service: Endoscopy;  Laterality: N/A;  12:00   IR IMAGING GUIDED PORT INSERTION  11/07/2021   left mastectomy     NEPHRECTOMY Right 02/06/2021   Procedure: NEPHRECTOMY- open radical;  Surgeon: Malen Gauze, MD;  Location: [facility];  Service: Urology;  Laterality: Right;   POLYPECTOMY  03/03/2020   Procedure: POLYPECTOMY;  Surgeon: Corbin Ade, MD;  Location: [facility];  Service: Endoscopy;;    Social History: Social History   Socioeconomic History   Marital status: Married    Spouse name: Not on file   Number of children: 2   Years of education: Not on file   Highest education level: GED or equivalent  Occupational History   Occupation: CNA  Tobacco Use   Smoking status: Former    Packs/day: 0.50    Years: 18.00    Additional pack years:  0.00    Total pack years: 9.00    Types: Cigarettes    Quit date: 07/01/2002    Years since quitting: 20.6   Smokeless tobacco: Never  Vaping Use   Vaping Use: Never used  Substance and Sexual Activity   Alcohol use: No   Drug use: No   Sexual activity: Yes    Birth control/protection: None  Other Topics  Concern   Not on file  Social History Narrative   Not on file   Social Determinants of Health   Financial Resource Strain: Low Risk  (02/02/2023)   Overall Financial Resource Strain (CARDIA)    Difficulty of Paying Living Expenses: Not hard at all  Food Insecurity: No Food Insecurity (02/02/2023)   Hunger Vital Sign    Worried About Running Out of Food in the Last Year: Never true    Ran Out of Food in the Last Year: Never true  Transportation Needs: No Transportation Needs (02/02/2023)   PRAPARE - Administrator, Civil Service (Medical): No    Lack of Transportation (Non-Medical): No  Physical Activity: Insufficiently Active (02/02/2023)   Exercise Vital Sign    Days of Exercise per Week: 3 days    Minutes of Exercise per Session: 30 min  Stress: Stress Concern Present (02/02/2023)   [employer] of Occupational Health - Occupational Stress Questionnaire    Feeling of Stress : To some extent  Social Connections: Moderately Integrated (02/02/2023)   Social Connection and Isolation Panel [NHANES]    Frequency of Communication with Friends and Family: Three times a week    Frequency of Social Gatherings with Friends and Family: Three times a week    Attends Religious Services: More than 4 times per year    Active Member of Clubs or Organizations: Yes    Attends Engineer, structural: More than 4 times per year    Marital Status: Separated  Intimate Partner Violence: Not on file    Family History: Family History  Problem Relation Age of Onset   Alcohol abuse Mother    Hypertension Mother    Diabetes Mother    Hyperlipidemia Mother    Colon cancer  Maternal Aunt        dx 9s   Prostate cancer Maternal Uncle    Throat cancer Maternal Uncle    Kidney cancer Maternal Grandfather    Lung cancer Half-Sister    Kidney cancer Half-Sister 67   Breast cancer Half-Sister 92   Breast cancer Half-Sister 95    Current Medications:  Current Outpatient Medications:    atorvastatin (LIPITOR) 20 MG tablet, Take 1 tablet (20 mg total) by mouth daily., Disp: 90 tablet, Rfl: 1   everolimus (AFINITOR) 5 MG tablet, Take 1 tablet (5 mg total) by mouth daily., Disp: 30 tablet, Rfl: 3   Iron, Ferrous Sulfate, 325 (65 Fe) MG TABS, Take 325 mg by mouth daily., Disp: 30 tablet, Rfl: 3   lenvatinib 18 mg daily dose (LENVIMA, 18 MG DAILY DOSE,) 10 MG & 2 x 4 MG capsule, Take two 4mg  capsules and one 10mg  capsule by mouth daily (total dose 18mg /day), Disp: 90 each, Rfl: 1   levothyroxine (SYNTHROID) 125 MCG tablet, Take 1 tablet (125 mcg total) by mouth daily before breakfast., Disp: 90 tablet, Rfl: 1   Multiple Vitamin (MULTIVITAMIN WITH MINERALS) TABS tablet, Take 1 tablet by mouth daily., Disp: , Rfl:    olmesartan-hydrochlorothiazide (BENICAR HCT) 40-25 MG tablet, Take 1 tablet by mouth daily., Disp: 90 tablet, Rfl: 3   acetaminophen (TYLENOL) 500 MG tablet, Take 500 mg by mouth every 6 (six) hours as needed for moderate pain or headache., Disp: , Rfl:    EPINEPHrine 0.3 mg/0.3 mL IJ SOAJ injection, Inject 0.3 mg into the muscle as needed for anaphylaxis., Disp: 2 each, Rfl: 0   mirtazapine (REMERON) 7.5 MG tablet, TAKE ONE TABLET BY MOUTH ONCE DAILY  AT BEDTIME, Disp: 90 tablet, Rfl: 3   UNABLE TO FIND, MASTECTOMY BRA AND PROTHESIS DX Z90.12, Disp: 6 each, Rfl: 2   UNABLE TO FIND, 6 mastectomy prosthesis and bras., Disp: 6 each, Rfl: 0   urea (CARMOL) 10 % cream, Apply topically 2 (two) times daily. Apply twice daily to hands, Disp: 71 g, Rfl: 0 No current facility-administered medications for this visit.  Facility-Administered Medications Ordered in Other  Visits:    heparin lock flush 100 unit/mL, 500 Units, Intravenous, Once, Doreatha Massed, MD   sodium chloride flush (NS) 0.9 % injection 10 mL, 10 mL, Intravenous, Once, Doreatha Massed, MD   Allergies: Allergies  Allergen Reactions   Amlodipine Swelling    Leg edema   Sulfonamide Derivatives Itching   Yellow Jacket Venom [Bee Venom] Rash    urticaria    REVIEW OF SYSTEMS:   Review of Systems  Constitutional:  Negative for chills, fatigue and fever.  HENT:   Negative for lump/mass, mouth sores, nosebleeds, sore throat and trouble swallowing.   Eyes:  Negative for eye problems.  Respiratory:  Negative for cough and shortness of breath.   Cardiovascular:  Negative for chest pain, leg swelling and palpitations.  Gastrointestinal:  Positive for diarrhea. Negative for abdominal pain, constipation, nausea and vomiting.  Genitourinary:  Negative for bladder incontinence, difficulty urinating, dysuria, frequency, hematuria and nocturia.   Musculoskeletal:  Negative for arthralgias, back pain, flank pain, myalgias and neck pain.  Skin:  Negative for itching and rash.  Neurological:  Negative for dizziness, headaches and numbness.  Hematological:  Does not bruise/bleed easily.  Psychiatric/Behavioral:  Negative for depression, sleep disturbance and suicidal ideas. The patient is not nervous/anxious.   All other systems reviewed and are negative.    VITALS:   Blood pressure 137/68, pulse 67, temperature (!) 97.3 F (36.3 C), temperature source Tympanic, resp. rate 18, height 5\' 6"  (1.676 m), weight 153 lb (69.4 kg), SpO2 100 %.  Wt Readings from Last 3 Encounters:  03/04/23 153 lb (69.4 kg)  02/28/23 153 lb 9.6 oz (69.7 kg)  02/06/23 156 lb 0.6 oz (70.8 kg)    Body mass index is 24.69 kg/m.  Performance status (ECOG): 0 - Asymptomatic  PHYSICAL EXAM:   Physical Exam Vitals and nursing note reviewed. Exam conducted with a chaperone present.  Constitutional:       Appearance: Normal appearance.  Cardiovascular:     Rate and Rhythm: Normal rate and regular rhythm.     Pulses: Normal pulses.     Heart sounds: Normal heart sounds.  Pulmonary:     Effort: Pulmonary effort is normal.     Breath sounds: Normal breath sounds.  Abdominal:     Palpations: Abdomen is soft. There is no hepatomegaly, splenomegaly or mass.     Tenderness: There is no abdominal tenderness.  Musculoskeletal:     Right lower leg: No edema.     Left lower leg: No edema.  Lymphadenopathy:     Cervical: No cervical adenopathy.     Right cervical: No superficial, deep or posterior cervical adenopathy.    Left cervical: No superficial, deep or posterior cervical adenopathy.     Upper Body:     Right upper body: No supraclavicular or axillary adenopathy.     Left upper body: No supraclavicular or axillary adenopathy.  Neurological:     General: No focal deficit present.     Mental Status: She is alert and oriented to person, place, and time.  Psychiatric:  Mood and Affect: Mood normal.        Behavior: Behavior normal.     LABS:      Latest Ref Rng & Units 02/28/2023   11:05 AM 12/23/2022    1:57 PM 10/29/2022    1:58 PM  CBC  WBC 4.0 - 10.5 K/uL 4.6  4.1  4.9   Hemoglobin 12.0 - 15.0 g/dL 16.1  9.9  09.6   Hematocrit 36.0 - 46.0 % 35.3  30.9  32.5   Platelets 150 - 400 K/uL 182  193  192       Latest Ref Rng & Units 02/28/2023   11:05 AM 12/23/2022    1:57 PM 10/29/2022    1:58 PM  CMP  Glucose 70 - 99 mg/dL 99  045  94   BUN 8 - 23 mg/dL 21  12  13    Creatinine 0.44 - 1.00 mg/dL 4.09  8.11  9.14   Sodium 135 - 145 mmol/L 134  135  133   Potassium 3.5 - 5.1 mmol/L 3.6  3.5  3.4   Chloride 98 - 111 mmol/L 99  103  101   CO2 22 - 32 mmol/L 27  25  26    Calcium 8.9 - 10.3 mg/dL 8.8  8.5  8.5   Total Protein 6.5 - 8.1 g/dL 6.8  6.7  7.1   Total Bilirubin 0.3 - 1.2 mg/dL 0.5  0.6  0.4   Alkaline Phos 38 - 126 U/L 74  89  84   AST 15 - 41 U/L 24  31  26    ALT  0 - 44 U/L 20  22  19       No results found for: "CEA1", "CEA" / No results found for: "CEA1", "CEA" No results found for: "PSA1" No results found for: "NWG956" No results found for: "CAN125"  No results found for: "TOTALPROTELP", "ALBUMINELP", "A1GS", "A2GS", "BETS", "BETA2SER", "GAMS", "MSPIKE", "SPEI" Lab Results  Component Value Date   TIBC 280 02/28/2023   TIBC 282 12/23/2022   TIBC 289 10/29/2022   FERRITIN 85 02/28/2023   FERRITIN 92 12/23/2022   FERRITIN 91 10/29/2022   IRONPCTSAT 16 02/28/2023   IRONPCTSAT 15 12/23/2022   IRONPCTSAT 20 10/29/2022   Lab Results  Component Value Date   LDH 178 02/21/2022   LDH 152 09/27/2021     STUDIES:   No results found.

## 2023-03-04 ENCOUNTER — Inpatient Hospital Stay (HOSPITAL_BASED_OUTPATIENT_CLINIC_OR_DEPARTMENT_OTHER): Payer: PRIVATE HEALTH INSURANCE | Admitting: Hematology

## 2023-03-04 ENCOUNTER — Encounter: Payer: Self-pay | Admitting: Hematology

## 2023-03-04 VITALS — BP 137/68 | HR 67 | Temp 97.3°F | Resp 18 | Ht 66.0 in | Wt 153.0 lb

## 2023-03-04 DIAGNOSIS — C641 Malignant neoplasm of right kidney, except renal pelvis: Secondary | ICD-10-CM | POA: Diagnosis not present

## 2023-03-04 DIAGNOSIS — C50912 Malignant neoplasm of unspecified site of left female breast: Secondary | ICD-10-CM | POA: Diagnosis not present

## 2023-03-04 LAB — URINALYSIS, DIPSTICK ONLY
Bilirubin Urine: NEGATIVE
Glucose, UA: NEGATIVE mg/dL
Hgb urine dipstick: NEGATIVE
Ketones, ur: NEGATIVE mg/dL
Leukocytes,Ua: NEGATIVE
Nitrite: NEGATIVE
Protein, ur: 100 mg/dL — AB
Specific Gravity, Urine: 1.013 (ref 1.005–1.030)
pH: 6 (ref 5.0–8.0)

## 2023-03-04 NOTE — Patient Instructions (Addendum)
[facility] at [facility] Discharge Instructions   You were seen and examined today by Dr. Ellin Saba.  He reviewed the results of your lab work which are normal/stable.   Continue Lenvima and everolimus as prescribed.   Return as scheduled.    Thank you for choosing [facility] at [facility] to provide your oncology and hematology care.  To afford each patient quality time with our provider, please arrive at least 15 minutes before your scheduled appointment time.   If you have a lab appointment with the Cancer Center please come in thru the Main Entrance and check in at the main information desk.  You need to re-schedule your appointment should you arrive 10 or more minutes late.  We strive to give you quality time with our providers, and arriving late affects you and other patients whose appointments are after yours.  Also, if you no show three or more times for appointments you may be dismissed from the clinic at the providers discretion.     Again, thank you for choosing [facility].  Our hope is that these requests will decrease the amount of time that you wait before being seen by our physicians.       _____________________________________________________________  Should you have questions after your visit to [facility], please contact our office at [phone] and follow the prompts.  Our office hours are 8:00 a.m. and 4:30 p.m. Monday - Friday.  Please note that voicemails left after 4:00 p.m. may not be returned until the following business day.  We are closed weekends and major holidays.  You do have access to a nurse 24-7, just call the main number to the clinic [phone] and do not press any options, hold on the line and a nurse will answer the phone.    For prescription refill requests, have your pharmacy contact our office and allow 72 hours.    Due to Covid, you will need to wear a mask upon  entering the hospital. If you do not have a mask, a mask will be given to you at the Main Entrance upon arrival. For doctor visits, patients may have 1 support person age 75 or older with them. For treatment visits, patients can not have anyone with them due to social distancing guidelines and our immunocompromised population.

## 2023-03-05 ENCOUNTER — Other Ambulatory Visit: Payer: Self-pay | Admitting: Family Medicine

## 2023-03-18 ENCOUNTER — Ambulatory Visit: Payer: No Typology Code available for payment source | Admitting: Family Medicine

## 2023-03-18 ENCOUNTER — Encounter: Payer: Self-pay | Admitting: Family Medicine

## 2023-03-18 VITALS — BP 112/78 | HR 65 | Ht 66.0 in | Wt 151.0 lb

## 2023-03-18 DIAGNOSIS — J069 Acute upper respiratory infection, unspecified: Secondary | ICD-10-CM | POA: Diagnosis not present

## 2023-03-18 MED ORDER — BENZONATATE 100 MG PO CAPS: 100.0000 mg | ORAL_CAPSULE | Freq: Two times a day (BID) | ORAL | 0 refills | Status: DC | PRN

## 2023-03-18 MED ORDER — PREDNISONE 20 MG PO TABS: 20.0000 mg | ORAL_TABLET | Freq: Two times a day (BID) | ORAL | 0 refills | Status: AC

## 2023-03-18 MED ORDER — AMOXICILLIN-POT CLAVULANATE 875-125 MG PO TABS: 1.0000 | ORAL_TABLET | Freq: Two times a day (BID) | ORAL | 0 refills | Status: AC

## 2023-03-18 NOTE — Progress Notes (Signed)
Patient Office Visit   Subjective   Patient ID: Deanna Bush, female    DOB: 28-May-1961  Age: 62 y.o. MRN: [id number]  CC:  Chief Complaint  Patient presents with   Cough    Patient complains of chest congestion, productive cough for 6 days.     HPI Deanna Bush 62 year old female, presents to the clinic for chest congestion and cough She  has a past medical history of Adenocarcinoma of breast (HCC), Cellulitis of leg, left, Complication of anesthesia, Diabetes mellitus without complication [facility]), Family history of breast cancer (08/16/2011), Family history of breast cancer, Family history of colon cancer, Family history of kidney cancer, Family history of prostate cancer, Hyperlipidemia, Hypertension, Hypothyroidism, MRSA (methicillin resistant staph aureus) culture positive (08/19/2011), and Pre-diabetes.   Patient complains of cough. Patient describes symptoms of chills without rigors, sore throat, sputum production, and sweats. Symptoms began 6 days ago and are gradually worsening since that time. Patient denies chest pain or nausea and vomiting. Treatment thus far includes OTC analgesics/antipyretics: not very effective Past pulmonary history is significant for no history of pneumonia     Outpatient Encounter Medications as of 03/18/2023  Medication Sig   acetaminophen (TYLENOL) 500 MG tablet Take 500 mg by mouth every 6 (six) hours as needed for moderate pain or headache.   atorvastatin (LIPITOR) 20 MG tablet Take 1 tablet (20 mg total) by mouth daily.   EPINEPHrine 0.3 mg/0.3 mL IJ SOAJ injection Inject 0.3 mg into the muscle as needed for anaphylaxis.   everolimus (AFINITOR) 5 MG tablet Take 1 tablet (5 mg total) by mouth daily.   Iron, Ferrous Sulfate, 325 (65 Fe) MG TABS Take 325 mg by mouth daily.   lenvatinib 18 mg daily dose (LENVIMA, 18 MG DAILY DOSE,) 10 MG & 2 x 4 MG capsule Take two 4mg  capsules and one 10mg  capsule by mouth daily (total dose 18mg /day)   levothyroxine  (SYNTHROID) 125 MCG tablet Take 1 tablet (125 mcg total) by mouth daily before breakfast.   mirtazapine (REMERON) 7.5 MG tablet TAKE ONE TABLET BY MOUTH ONCE DAILY AT BEDTIME   Multiple Vitamin (MULTIVITAMIN WITH MINERALS) TABS tablet Take 1 tablet by mouth daily.   olmesartan-hydrochlorothiazide (BENICAR HCT) 40-25 MG tablet TAKE 1 TABLET BY MOUTH DAILY   UNABLE TO FIND MASTECTOMY BRA AND PROTHESIS DX Z90.12   UNABLE TO FIND 6 mastectomy prosthesis and bras.   urea (CARMOL) 10 % cream Apply topically 2 (two) times daily. Apply twice daily to hands   Facility-Administered Encounter Medications as of 03/18/2023  Medication   heparin lock flush 100 unit/mL   sodium chloride flush (NS) 0.9 % injection 10 mL    Past Surgical History:  Procedure Laterality Date   BREAST SURGERY Left    mastectomy   CESAREAN SECTION     x2   COLONOSCOPY N/A 03/03/2020   Procedure: COLONOSCOPY;  Surgeon: Corbin Ade, MD;  Location: [facility];  Service: Endoscopy;  Laterality: N/A;  12:00   IR IMAGING GUIDED PORT INSERTION  11/07/2021   left mastectomy     NEPHRECTOMY Right 02/06/2021   Procedure: NEPHRECTOMY- open radical;  Surgeon: Malen Gauze, MD;  Location: [facility];  Service: Urology;  Laterality: Right;   POLYPECTOMY  03/03/2020   Procedure: POLYPECTOMY;  Surgeon: Corbin Ade, MD;  Location: [facility];  Service: Endoscopy;;    Review of Systems  Constitutional:  Positive for chills and fever.  HENT:  Positive for congestion and sore throat.   Eyes:  Negative for blurred vision.  Respiratory:  Positive for cough. Negative for shortness of breath.   Cardiovascular:  Negative for palpitations.  Gastrointestinal:  Negative for abdominal pain.  Genitourinary:  Negative for dysuria.  Neurological:  Negative for dizziness and headaches.      Objective    BP 112/78   Pulse 65   Ht 5\' 6"  (1.676 m)   Wt 151 lb (68.5 kg)   SpO2 95%   BMI 24.37 kg/m   Physical Exam Vitals  reviewed.  Constitutional:      General: She is not in acute distress.    Appearance: Normal appearance. She is not ill-appearing, toxic-appearing or diaphoretic.  HENT:     Head: Normocephalic.  Eyes:     General:        Right eye: No discharge.        Left eye: No discharge.     Conjunctiva/sclera: Conjunctivae normal.  Cardiovascular:     Rate and Rhythm: Normal rate.     Pulses: Normal pulses.     Heart sounds: Normal heart sounds.  Pulmonary:     Effort: Pulmonary effort is normal. No respiratory distress.     Breath sounds: Normal breath sounds.  Musculoskeletal:        General: Normal range of motion.     Cervical back: Normal range of motion.  Skin:    General: Skin is warm and dry.     Capillary Refill: Capillary refill takes less than 2 seconds.  Neurological:     General: No focal deficit present.     Mental Status: She is alert and oriented to person, place, and time.     Coordination: Coordination normal.     Gait: Gait normal.  Psychiatric:        Mood and Affect: Mood normal.        Behavior: Behavior normal.       Assessment & Plan:  Upper respiratory tract infection, unspecified type Assessment & Plan: Augmentin 875-125 mg x 7 days, Prednisone 20 mg x 5 days. Benzonatate 100 mg PRN Symptomatic treatment, rest, increase oral fluid intake. Take OTC tylenol for fever, pain Follow-up for worsening or persistent symptoms. Patient verbalizes understanding regarding plan of care and all questions answered      Return if symptoms worsen or fail to improve.   Cruzita Lederer Newman Nip, FNP

## 2023-03-18 NOTE — Patient Instructions (Signed)
        Great to see you today.   - Please take medications as prescribed. - Follow up with your primary health provider if any health concerns arises. - If symptoms worsen please contact your primary care provider and/or visit the emergency department.  

## 2023-03-18 NOTE — Assessment & Plan Note (Signed)
Augmentin 875-125 mg x 7 days, Prednisone 20 mg x 5 days. Benzonatate 100 mg PRN Symptomatic treatment, rest, increase oral fluid intake. Take OTC tylenol for fever, pain Follow-up for worsening or persistent symptoms. Patient verbalizes understanding regarding plan of care and all questions answered

## 2023-04-01 ENCOUNTER — Ambulatory Visit (INDEPENDENT_AMBULATORY_CARE_PROVIDER_SITE_OTHER): Payer: No Typology Code available for payment source | Admitting: Psychiatry

## 2023-04-01 DIAGNOSIS — F4323 Adjustment disorder with mixed anxiety and depressed mood: Secondary | ICD-10-CM

## 2023-04-01 NOTE — Progress Notes (Signed)
IN-PERSON  THERAPIST PROGRESS NOTE  Session Time: Tuesday 04/01/2023 4:11 PM - 5:00 PM      Participation Level: Active  Behavioral Response: CasualAlert/sad, anxious,tearful at times  Type of Therapy: Individual Therapy  Treatment Goals addressed: Have healthy grieving process around loss Begin verbalizing feelings associated with loss     ProgressTowards Goals: Progressing  Interventions: Supportive  Summary: Deanna Bush is a 62 y.o. female who p is referred for services by PCP Dr. Lodema Hong due to pt experiencing symptoms of anxiety along with grief and loss issues. Pt denies any psychiatric hospitalizations. Pt has no previous involvement in outpatient therapy.  Per patient's report her stepdaughter was diagnosed with colon cancer at age 32 and died in 2022-11-09.  Patient was her caretaker and says patient died at her home.  Patient reports her husband left her about 2 weeks ago for 25 year old after 30 years of marriage.  Patient reports additional stress regarding providing care for her 53 year old mother.  Patient also has her own health issues as she was diagnosed with kidney cancer 2 years ago and had chemo.  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 She currently is taking experimental drugs.  Patient reports crying spells, sadness, irritability, worry, anxiety, and sleeping difficulty.   Patient last was seen about 5-7 weeks  ago .  She reports coping better since last session.  She has continued to attend work, attend church, and going out doing activities with her sisters.  She also has been working on a household project of staining her deck.  Per her report, she contacted an attorney 2 weeks ago to begin the legal process regarding dissolution of her marriage.  She reports feeling positive about this as now financial issues will be addressed.  She continues to express hurt, anger, frustration, and confusion regarding husband's behavior.  She also  reports feelings of betrayal as well as self-doubt.  She also discloses more information today regarding the effects on her children and her relationship with her in-laws.    Suicidal/Homicidal: Nowithout intent/plan  Therapist Response: Reviewed symptoms, praised and reinforced patient's involvement in activity and consistency, facilitated patient expressing thoughts and feelings about beginning the legal process, facilitated patient identifying and expressing thoughts/feelings regarding the effects of the dissolution of the marriage on her children as well as her relationship with her in-laws, began to discuss the 5 stages of grief associated with divorce, discussed grieving process as  not being a linear process,  developed plan with patient to review handout in preparation for next session   Plan: Return again in 2 weeks.  Diagnosis: Adjustment disorder with mixed anxiety and depressed mood  Collaboration of Care: Primary Care Provider AEB patient is seeing PCP Dr. [name] for medication management  Patient/Guardian was advised Release of Information must be obtained prior to any record release in order to collaborate their care with an outside provider. Patient/Guardian was advised if they have not already done so to contact the registration department to sign all necessary forms in order for [place] to release information regarding their care.   Consent: Patient/Guardian gives verbal consent for treatment and assignment of benefits for services provided during this visit. Patient/Guardian expressed understanding and agreed to proceed.   Adah Salvage, LCSW 04/01/2023

## 2023-04-11 ENCOUNTER — Other Ambulatory Visit: Payer: Self-pay | Admitting: Physician Assistant

## 2023-04-11 ENCOUNTER — Telehealth: Payer: Self-pay

## 2023-04-11 DIAGNOSIS — C641 Malignant neoplasm of right kidney, except renal pelvis: Secondary | ICD-10-CM

## 2023-04-11 MED ORDER — EVEROLIMUS 5 MG PO TABS: 5.0000 mg | ORAL_TABLET | Freq: Every day | ORAL | 3 refills | Status: DC

## 2023-04-11 NOTE — Telephone Encounter (Signed)
Spoke with patient at clinic today and stated she needed to have her Afinitor refilled. Notified MD and Reb. Pennington PA-C. Sent to [facility] in [place]. Verified with Patient that it was the correct pharmacy. Patient understood.

## 2023-04-16 ENCOUNTER — Ambulatory Visit (HOSPITAL_COMMUNITY): Payer: No Typology Code available for payment source | Admitting: Psychiatry

## 2023-04-16 DIAGNOSIS — F4323 Adjustment disorder with mixed anxiety and depressed mood: Secondary | ICD-10-CM | POA: Diagnosis not present

## 2023-04-16 NOTE — Progress Notes (Signed)
IN-PERSON  THERAPIST PROGRESS NOTE  Session Time: Wednesday 04/16/2023 4:12 PM -  4:55 PM     Participation Level: Active  Behavioral Response: CasualAlert/sad, anxious,tearful at times  Type of Therapy: Individual Therapy  Treatment Goals addressed: Have healthy grieving process around loss Begin verbalizing feelings associated with loss     ProgressTowards Goals: Progressing  Interventions: Supportive  Summary: Deanna Bush is a 62 y.o. female who p is referred for services by PCP Dr. Lodema Hong due to pt experiencing symptoms of anxiety along with grief and loss issues. Pt denies any psychiatric hospitalizations. Pt has no previous involvement in outpatient therapy.  Per patient's report her stepdaughter was diagnosed with colon cancer at age 60 and died in 10-26-22.  Patient was her caretaker and says patient died at her home.  Patient reports her husband left her about 2 weeks ago for 31 year old after 30 years of marriage.  Patient reports additional stress regarding providing care for her 69 year old mother.  Patient also has her own health issues as she was diagnosed with kidney cancer 2 years ago and had chemo.  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 She currently is taking experimental drugs.  Patient reports crying spells, sadness, irritability, worry, anxiety, and sleeping difficulty.   Patient last was seen about 2 weeks  ago .  She reports continued stress regarding dissolution of her marriage and the effects on her as well as her family.  She verbalizes feelings of anger and betrayal.  She expresses frustration as husband still comes to her home unannounced to pick up personal items.  She reports waiting until her attorney completes part of the legal process before she changes the locks and set limits with her husband coming to her home.  She reports additional stress regarding her youngest son and his wife recently separating and son moving back  to her home.  She also reports his 2 children, ages 23 and 52, often staying in her home as well.  Patient reports still feeling overwhelmed at times and reports recently taking a mental health day for more.  She also has tried to maintain involvement in household projects and has been doing some reading as well as attending church.  She is continuing to try to adjust to dissolution of marriage.     Suicidal/Homicidal: Nowithout intent/plan  Therapist Response: Reviewed symptoms, praised and reinforced patient's involvement in activity and consistency, discussed stressors, facilitated expression of thoughts and feelings, validated feelings, discussed the 5 stages of grief associated with divorce, assisted patient identify experience she has had with each stage assisted patient identify the stage she currently is experiencing, began to introduce the 4 stages of divorce, provided patient with handout to review in preparation for next session, encouraged patient to maintain positive self-care, also assisted patient identify ways to increase involvement in pleasurable activities  Plan: Return again in 2 weeks.  Diagnosis: Adjustment disorder with mixed anxiety and depressed mood  Collaboration of Care: Primary Care Provider AEB patient is seeing PCP Dr. [name] for medication management  Patient/Guardian was advised Release of Information must be obtained prior to any record release in order to collaborate their care with an outside provider. Patient/Guardian was advised if they have not already done so to contact the registration department to sign all necessary forms in order for [place] to release information regarding their care.   Consent: Patient/Guardian gives verbal consent for treatment and assignment of benefits for  services provided during this visit. Patient/Guardian expressed understanding and agreed to proceed.   Adah Salvage, LCSW 04/16/2023

## 2023-04-23 ENCOUNTER — Other Ambulatory Visit: Payer: Self-pay

## 2023-04-23 DIAGNOSIS — C641 Malignant neoplasm of right kidney, except renal pelvis: Secondary | ICD-10-CM

## 2023-04-23 MED ORDER — LENVATINIB (18 MG DAILY DOSE) 10 MG & 2 X 4 MG PO CPPK: ORAL_CAPSULE | ORAL | 1 refills | Status: DC

## 2023-04-23 NOTE — Telephone Encounter (Signed)
Chart reviewed. Lenvima refilled per last office note with Dr. Ellin Saba.

## 2023-04-29 ENCOUNTER — Ambulatory Visit (HOSPITAL_COMMUNITY)
Admission: RE | Admit: 2023-04-29 | Discharge: 2023-04-29 | Disposition: A | Discharge status: Home / Self Care | Payer: No Typology Code available for payment source | Source: Ambulatory Visit | Attending: Hematology | Admitting: Hematology

## 2023-04-29 ENCOUNTER — Inpatient Hospital Stay: Payer: No Typology Code available for payment source | Attending: Hematology

## 2023-04-29 DIAGNOSIS — C641 Malignant neoplasm of right kidney, except renal pelvis: Secondary | ICD-10-CM | POA: Diagnosis present

## 2023-04-29 LAB — CBC WITH DIFFERENTIAL/PLATELET
Abs Immature Granulocytes: 0.01 10*3/uL (ref 0.00–0.07)
Basophils Absolute: 0 10*3/uL (ref 0.0–0.1)
Basophils Relative: 0 %
Eosinophils Absolute: 0 10*3/uL (ref 0.0–0.5)
Eosinophils Relative: 1 %
HCT: 36.6 % (ref 36.0–46.0)
Hemoglobin: 11.7 g/dL — ABNORMAL LOW (ref 12.0–15.0)
Immature Granulocytes: 0 %
Lymphocytes Relative: 33 %
Lymphs Abs: 1.4 10*3/uL (ref 0.7–4.0)
MCH: 28.8 pg (ref 26.0–34.0)
MCHC: 32 g/dL (ref 30.0–36.0)
MCV: 90.1 fL (ref 80.0–100.0)
Monocytes Absolute: 0.3 10*3/uL (ref 0.1–1.0)
Monocytes Relative: 6 %
Neutro Abs: 2.5 10*3/uL (ref 1.7–7.7)
Neutrophils Relative %: 60 %
Platelets: 220 10*3/uL (ref 150–400)
RBC: 4.06 MIL/uL (ref 3.87–5.11)
RDW: 13.6 % (ref 11.5–15.5)
WBC: 4.2 10*3/uL (ref 4.0–10.5)
nRBC: 0 % (ref 0.0–0.2)

## 2023-04-29 LAB — IRON AND TIBC
Iron: 62 ug/dL (ref 28–170)
Saturation Ratios: 20 % (ref 10.4–31.8)
TIBC: 307 ug/dL (ref 250–450)
UIBC: 245 ug/dL

## 2023-04-29 LAB — COMPREHENSIVE METABOLIC PANEL
ALT: 22 U/L (ref 0–44)
AST: 28 U/L (ref 15–41)
Albumin: 3.4 g/dL — ABNORMAL LOW (ref 3.5–5.0)
Alkaline Phosphatase: 76 U/L (ref 38–126)
Anion gap: 9 (ref 5–15)
BUN: 22 mg/dL (ref 8–23)
CO2: 27 mmol/L (ref 22–32)
Calcium: 9.4 mg/dL (ref 8.9–10.3)
Chloride: 102 mmol/L (ref 98–111)
Creatinine, Ser: 1.15 mg/dL — ABNORMAL HIGH (ref 0.44–1.00)
GFR, Estimated: 54 mL/min — ABNORMAL LOW (ref >60)
Glucose, Bld: 113 mg/dL — ABNORMAL HIGH (ref 70–99)
Potassium: 3.7 mmol/L (ref 3.5–5.1)
Sodium: 138 mmol/L (ref 135–145)
Total Bilirubin: 0.5 mg/dL (ref 0.3–1.2)
Total Protein: 7.3 g/dL (ref 6.5–8.1)

## 2023-04-29 LAB — FERRITIN: Ferritin: 152 ng/mL (ref 11–307)

## 2023-04-29 MED ORDER — IOHEXOL 300 MG/ML  SOLN
100.0000 mL | Freq: Once | INTRAMUSCULAR | Status: AC | PRN
  Administered 2023-04-29: 100 mL via INTRAVENOUS

## 2023-04-29 MED ORDER — IOPAMIDOL (ISOVUE-300) INJECTION 61%: 100.0000 mL | Freq: Once | INTRAVENOUS | Status: DC | PRN

## 2023-04-30 ENCOUNTER — Ambulatory Visit: Payer: No Typology Code available for payment source | Admitting: Urology

## 2023-05-01 ENCOUNTER — Ambulatory Visit (HOSPITAL_COMMUNITY): Payer: No Typology Code available for payment source | Admitting: Psychiatry

## 2023-05-01 DIAGNOSIS — F4323 Adjustment disorder with mixed anxiety and depressed mood: Secondary | ICD-10-CM | POA: Diagnosis not present

## 2023-05-01 NOTE — Progress Notes (Signed)
IN-PERSON  THERAPIST PROGRESS NOTE  Session Time: Thursday  05/01/2023 4:08 PM -   4:58 PM        Participation Level: Active  Behavioral Response: CasualAlert/sad, anxious,tearful at times  Type of Therapy: Individual Therapy  Treatment Goals addressed: Have healthy grieving process around loss Begin verbalizing feelings associated with loss     ProgressTowards Goals: Progressing  Interventions: Supportive  Summary: Deanna Bush is a 62 y.o. female who p is referred for services by PCP Dr. Lodema Hong due to pt experiencing symptoms of anxiety along with grief and loss issues. Pt denies any psychiatric hospitalizations. Pt has no previous involvement in outpatient therapy.  Per patient's report her stepdaughter was diagnosed with colon cancer at age 58 and died in 11/08/22.  Patient was her caretaker and says patient died at her home.  Patient reports her husband left her about 2 weeks ago for 72 year old after 30 years of marriage.  Patient reports additional stress regarding providing care for her 52 year old mother.  Patient also has her own health issues as she was diagnosed with kidney cancer 2 years ago and had chemo.  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 She currently is taking experimental drugs.  Patient reports crying spells, sadness, irritability, worry, anxiety, and sleeping difficulty.   Patient last was seen about 2 weeks  ago .  She reports continued stress regarding dissolution of her marriage and the effects on her as well as her family.  She states today is not a good day and she is feeling down.  Per patient's report, this was triggered by seeing her estranged husband with his girlfriend this past weekend.  She also reports painful informed her they saw him waiting for his girlfriend outside her place of employment.  Patient reports additional stressors to attending recent events where people have asked her about her husband as they did not  know patient and her husband are separated.  Patient expresses frustration and anger regarding husband's behavior as well as his failure to respond to her request to take care household maintenance and repairs.  Patient has maintain positive self-care and involvement in activities.  She reports planning to go to the beach next month with family.  Suicidal/Homicidal: Nowithout intent/plan  Therapist Response: Reviewed symptoms, praised and reinforced patient's involvement in activity and consistency, discussed stressors, facilitated expression of thoughts and feelings, validated feelings, assisted patient examine her expectations and pattern of interaction with estranged husband, assisted patient identify the effects on her current functioning,, assisted patient identify realistic expectations regarding interaction with husband and ways to set/maintain limits, began to discuss the first stage of divorce and tasks associated with that stage, assisted patient identify ways to approach the task of telling the world about dissolution of her marriage, discussed possible script, assisted patient identify people in her support system, developed plan with patient to reach out to her sister, also discussed ways patient can obtain practical help to manage household repairs and maintenance, developed plan with patient to use physical activity such as walking to manage stress, introduced handout on identifying her feelings, developed plan with patient to review in preparation for next session  Plan: Return again in 2 weeks.  Diagnosis: Adjustment disorder with mixed anxiety and depressed mood  Collaboration of Care: Primary Care Provider AEB patient is seeing PCP Dr. [name] for medication management  Patient/Guardian was advised Release of Information must be obtained prior to any record release  in order to collaborate their care with an outside provider. Patient/Guardian was advised if they have not  already done so to contact the registration department to sign all necessary forms in order for [place] to release information regarding their care.   Consent: Patient/Guardian gives verbal consent for treatment and assignment of benefits for services provided during this visit. Patient/Guardian expressed understanding and agreed to proceed.   Adah Salvage, LCSW 05/01/2023

## 2023-05-05 NOTE — Progress Notes (Signed)
[facility] [address] [place] [address]    Clinic Day:  05/06/2023  Referring physician: Kerri Perches, MD  Patient Care Team: Deanna Perches, MD as PCP - General Mallipeddi, Deanna Modest, MD as PCP - Cardiology (Cardiology) Deanna Massed, MD as Medical Oncologist (Medical Oncology)   ASSESSMENT & PLAN:   Assessment: 1. Metastatic clear-cell renal cell carcinoma: - Right radical nephrectomy on 02/06/2021. - Pathology shows clear-cell RCC, grade 4, 12.5 cm, necrosis and rhabdoid features are identified.  Tumor extends into the and invades the wall of the vena cava (pT3c).  Vascular, ureteral and all margins of resection are negative for tumor. - CT scan abdomen with and without contrast on 05/29/2021 showed several nodules along the peritoneal surface of the right nephrectomy bed warranting close attention. - CTAP with and without contrast on 09/18/2021 showed progressive nodularity within the right nephrectomy bed, with enlarged nodules adjacent to the right hepatic lobe extending inferiorly along the right retroperitoneum with multiple enlarged enhancing nodules.  Direct metastatic invasion into the right hepatic lobe.  Small nodule at the right lung base favored to be benign. - IMDC: Intermediate risk group with 2 features including diagnosis less than 1 year and hemoglobin lower than normal. - Bone scan on 10/05/2021 with focal activity in the mental vertex of the mandible which could be inflammatory/traumatic/metastatic. - CT chest on 10/03/2021 showed several lung nodules scattered bilaterally some of which could be metastatic and many others could be due to mucoid impaction. - NGS testing did not show any targetable mutations.  TMB was low.  CCND3 amplified.  PBRM1 VUS present. - Right nephrectomy bed biopsy (10/22/2021): Clear-cell renal cell carcinoma with necrosis - 4 cycles of ipilimumab and nivolumab from 11/19/2021 through 01/24/2022 - CT CAP  on 02/14/2022: Numerous new and enlarged lung nodules.  Significant interval increase in the mass in the right renal bed.  New hypodense liver lesions. - Lenvatinib (12 mg) and everolimus 5 mg daily started on 03/13/2022   2. Social/family history: - She lives at home with her husband and also takes care of her mother. - She works as a Child psychotherapist at [employer].  She quit smoking in 2004 and smoked half pack per day for 10 years. - Half sister (same mother) had kidney cancer at age 25.  Maternal grandfather had kidney cancer.  Sister died of lung cancer.  2 half sisters (same father) had breast cancers.  3.  Left breast stage I tubular adenocarcinoma: - She underwent left mastectomy on 06/11/2002.  0/6 lymph nodes positive.  ER 90%, PR 92%, Ki-67 6%, HER2 negative.  As per chart, declined antiestrogen therapy.    Plan: 1. Metastatic clear-cell RCC, intermediate risk by IMDC: - She is tolerating lenvatinib and everolimus reasonably well. - Reviewed labs today: Creatinine 1.15.  LFTs normal.  CBC grossly normal. - Reviewed CT CAP from 04/29/2023: Liver lesions are stable.  Nodularity in the right retroperitoneum, porta hepatis more or less stable.  2 new small nodules, subcentimeter in the right erector spinae musculature. - As there is only 2 small subcentimeter lesions which are new, I have recommended continuing lenvatinib 18 mg daily and everolimus 5 mg daily. - Plan on repeating CT CAP in 10 weeks.  2.  Normocytic anemia: - Normocytic anemia from CKD, functional iron deficiency and myelosuppression.  Hemoglobin 11.7.  Ferritin 152 and percent saturation 20.  Continue iron tablet daily.  3.  Hypothyroidism: - Continue Synthroid  125 mcg daily.   4.  Hypertension: - Continue olmesartan/HCTZ 40/25 mg daily.  Blood pressure is 10/30/1966.  5.  Diarrhea: - She has diarrhea, watery stool 1/day every other day.  Continue Imodium as needed.  6.  Weight loss: - She has lost about 3  pounds since last visit.  She is drinking about 2 cans of Ensure high-protein per day.  Will closely monitor.  May increase Ensure to 3 cans/day if decreased oral intake.    Orders Placed This Encounter  Procedures   CT CHEST ABDOMEN PELVIS W CONTRAST    Standing Status:   Future    Standing Expiration Date:   05/05/2024    Order Specific Question:   If indicated for the ordered procedure, I authorize the administration of contrast media per Radiology protocol    Answer:   Yes    Order Specific Question:   Does the patient have a contrast media/X-ray dye allergy?    Answer:   No    Order Specific Question:   Preferred imaging location?    Answer:   [facility]    Order Specific Question:   If indicated for the ordered procedure, I authorize the administration of oral contrast media per Radiology protocol    Answer:   Yes   CBC with Differential    Standing Status:   Future    Standing Expiration Date:   05/05/2024   Comprehensive metabolic panel    Standing Status:   Future    Standing Expiration Date:   05/05/2024   Lactate dehydrogenase    Standing Status:   Future    Standing Expiration Date:   05/05/2024   Iron and TIBC ([facility] ONLY)    Standing Status:   Future    Standing Expiration Date:   05/05/2024   Ferritin    Standing Status:   Future    Standing Expiration Date:   05/05/2024      Deanna Bush,acting as a scribe for Deanna Massed, MD.,have documented all relevant documentation on the behalf of Deanna Massed, MD,as directed by  Deanna Massed, MD while in the presence of Deanna Massed, MD.  I, Deanna Massed MD, have reviewed the above documentation for accuracy and completeness, and I agree with the above.    Deanna Massed, MD   7/30/20245:49 PM  CHIEF COMPLAINT:   Diagnosis: metastatic clear-cell renal cell carcinoma    Cancer Staging  Malignant neoplasm of female breast [facility]) Staging form:  Breast, AJCC 6th Edition - Clinical: Stage I (T1b, N0, M0) - Signed by Deanna Newer, PA on 08/16/2011  Renal cell cancer [facility]) Staging form: Kidney, AJCC 8th Edition - Clinical stage from 09/26/2021: Stage IV (cT3c, cNX, cM1) - Unsigned    Prior Therapy: Nivolumab plus ipilimumab for 4 cycles with progression   Current Therapy:  Lenvatinib and everolimus    HISTORY OF PRESENT ILLNESS:   Oncology History  Renal cell cancer (HCC)  06/20/2021 Initial Diagnosis   Renal cell carcinoma of right kidney (HCC)   11/19/2021 - 01/24/2022 Chemotherapy   Patient is on Treatment Plan : RENAL CELL CARCINOMA Nivolumab + Ipilimumab q21d / Nivolumab q28d      Genetic Testing   Negative genetic testing. No pathogenic variants identified on the [facility] Multi-Cancer Panel + RNA. VUS in PMS2 called c.1732C>A and in RECQL4 called c.2967G>A  Identified. The report date is 11/01/2021.   The Multi-Cancer Panel + RNA offered by [facility] includes sequencing and/or deletion duplication  testing of the following 84 genes: AIP, ALK, APC, ATM, AXIN2,BAP1,  BARD1, BLM, BMPR1A, BRCA1, BRCA2, BRIP1, CASR, CDC73, CDH1, CDK4, CDKN1B, CDKN1C, CDKN2A (p14ARF), CDKN2A (p16INK4a), CEBPA, CHEK2, CTNNA1, DICER1, DIS3L2, EGFR (c.2369C>T, p.Thr790Met variant only), EPCAM (Deletion/duplication testing only), FH, FLCN, GATA2, GPC3, GREM1 (Promoter region deletion/duplication testing only), HOXB13 (c.251G>A, p.Gly84Glu), HRAS, KIT, MAX, MEN1, MET, MITF (c.952G>A, p.Glu318Lys variant only), MLH1, MSH2, MSH3, MSH6, MUTYH, NBN, NF1, NF2, NTHL1, PALB2, PDGFRA, PHOX2B, PMS2, POLD1, POLE, POT1, PRKAR1A, PTCH1, PTEN, RAD50, RAD51C, RAD51D, RB1, RECQL4, RET, RUNX1, SDHAF2, SDHA (sequence changes only), SDHB, SDHC, SDHD, SMAD4, SMARCA4, SMARCB1, SMARCE1, STK11, SUFU, TERC, TERT, TMEM127, TP53, TSC1, TSC2, VHL, WRN and WT1.       INTERVAL HISTORY:   Deanna Bush is a 62 y.o. female presenting to clinic today for follow up of metastatic clear-cell  renal cell carcinoma. She was last seen by me on 03/04/23.  She underwent a CT CAP on 7/23 that found: very extensive rim enhancing soft tissue nodularity throughout the right retroperitoneum, hepatorenal fossa, right paracolic gutter, and porta hepatis, as well as within the overlying right flank musculature; some  very slightly enlarged nodules inferiorly, as well as two new small nodules in the right erector spinae musculature, consistent with slightly worsened local disease; multiple hypodense liver parenchymal lesions are not significantly changed in size, however some of these are increasingly hypodense, suggesting treatment response of hepatic metastatic disease; similar small quantity of tumor nodularity or thrombus involving the IVC at the level of the right renal vein; and unchanged small irregular pulmonary nodule of the anterior left upper lobe, trace right pleural effusion, and small volume free fluid in the low pelvis.   Today, she states that she is doing well overall. Her appetite level is at 100%. Her energy level is at 75%.  PAST MEDICAL HISTORY:   Past Medical History: Past Medical History:  Diagnosis Date   Adenocarcinoma of breast (HCC)    left    Cellulitis of leg, left    Complication of anesthesia    Hard to wake up   Diabetes mellitus without complication (HCC)    Pt denies   Family history of breast cancer 08/16/2011   Family history of breast cancer    Family history of colon cancer    Family history of kidney cancer    Family history of prostate cancer    Hyperlipidemia    Hypertension    Hypothyroidism    MRSA (methicillin resistant staph aureus) culture positive 08/19/2011   Pre-diabetes     Surgical History: Past Surgical History:  Procedure Laterality Date   BREAST SURGERY Left    mastectomy   CESAREAN SECTION     x2   COLONOSCOPY N/A 03/03/2020   Procedure: COLONOSCOPY;  Surgeon: Corbin Ade, MD;  Location: [facility];  Service: Endoscopy;   Laterality: N/A;  12:00   IR IMAGING GUIDED PORT INSERTION  11/07/2021   left mastectomy     NEPHRECTOMY Right 02/06/2021   Procedure: NEPHRECTOMY- open radical;  Surgeon: Malen Gauze, MD;  Location: [facility];  Service: Urology;  Laterality: Right;   POLYPECTOMY  03/03/2020   Procedure: POLYPECTOMY;  Surgeon: Corbin Ade, MD;  Location: [facility];  Service: Endoscopy;;    Social History: Social History   Socioeconomic History   Marital status: Married    Spouse name: Not on file   Number of children: 2   Years of education: Not on file   Highest education level: GED  or equivalent  Occupational History   Occupation: CNA  Tobacco Use   Smoking status: Former    Current packs/day: 0.00    Average packs/day: 0.5 packs/day for 18.0 years (9.0 ttl pk-yrs)    Types: Cigarettes    Start date: 07/01/1984    Quit date: 07/01/2002    Years since quitting: 20.8   Smokeless tobacco: Never  Vaping Use   Vaping status: Never Used  Substance and Sexual Activity   Alcohol use: No   Drug use: No   Sexual activity: Yes    Birth control/protection: None  Other Topics Concern   Not on file  Social History Narrative   Not on file   Social Determinants of Health   Financial Resource Strain: Low Risk  (02/02/2023)   Overall Financial Resource Strain (CARDIA)    Difficulty of Paying Living Expenses: Not hard at all  Food Insecurity: No Food Insecurity (02/02/2023)   Hunger Vital Sign    Worried About Running Out of Food in the Last Year: Never true    Ran Out of Food in the Last Year: Never true  Transportation Needs: No Transportation Needs (02/02/2023)   PRAPARE - Administrator, Civil Service (Medical): No    Lack of Transportation (Non-Medical): No  Physical Activity: Insufficiently Active (02/02/2023)   Exercise Vital Sign    Days of Exercise per Week: 3 days    Minutes of Exercise per Session: 30 min  Stress: Stress Concern Present (02/02/2023)   [employer]  of Occupational Health - Occupational Stress Questionnaire    Feeling of Stress : To some extent  Social Connections: Moderately Integrated (02/02/2023)   Social Connection and Isolation Panel [NHANES]    Frequency of Communication with Friends and Family: Three times a week    Frequency of Social Gatherings with Friends and Family: Three times a week    Attends Religious Services: More than 4 times per year    Active Member of Clubs or Organizations: Yes    Attends Engineer, structural: More than 4 times per year    Marital Status: Separated  Intimate Partner Violence: Not on file    Family History: Family History  Problem Relation Age of Onset   Alcohol abuse Mother    Hypertension Mother    Diabetes Mother    Hyperlipidemia Mother    Colon cancer Maternal Aunt        dx 32s   Prostate cancer Maternal Uncle    Throat cancer Maternal Uncle    Kidney cancer Maternal Grandfather    Lung cancer Half-Sister    Kidney cancer Half-Sister 63   Breast cancer Half-Sister 57   Breast cancer Half-Sister 88    Current Medications:  Current Outpatient Medications:    acetaminophen (TYLENOL) 500 MG tablet, Take 500 mg by mouth every 6 (six) hours as needed for moderate pain or headache., Disp: , Rfl:    atorvastatin (LIPITOR) 20 MG tablet, Take 1 tablet (20 mg total) by mouth daily., Disp: 90 tablet, Rfl: 1   benzonatate (TESSALON) 100 MG capsule, Take 1 capsule (100 mg total) by mouth 2 (two) times daily as needed for cough., Disp: 20 capsule, Rfl: 0   EPINEPHrine 0.3 mg/0.3 mL IJ SOAJ injection, Inject 0.3 mg into the muscle as needed for anaphylaxis., Disp: 2 each, Rfl: 0   everolimus (AFINITOR) 5 MG tablet, Take 1 tablet (5 mg total) by mouth daily., Disp: 30 tablet, Rfl: 3   Iron, Ferrous  Sulfate, 325 (65 Fe) MG TABS, Take 325 mg by mouth daily., Disp: 30 tablet, Rfl: 3   lenvatinib 18 mg daily dose (LENVIMA, 18 MG DAILY DOSE,) 10 MG & 2 x 4 MG capsule, Take two 4mg  capsules  and one 10mg  capsule by mouth daily (total dose 18mg /day), Disp: 90 each, Rfl: 1   levothyroxine (SYNTHROID) 125 MCG tablet, Take 1 tablet (125 mcg total) by mouth daily before breakfast., Disp: 90 tablet, Rfl: 1   mirtazapine (REMERON) 7.5 MG tablet, TAKE ONE TABLET BY MOUTH ONCE DAILY AT BEDTIME, Disp: 90 tablet, Rfl: 3   Multiple Vitamin (MULTIVITAMIN WITH MINERALS) TABS tablet, Take 1 tablet by mouth daily., Disp: , Rfl:    olmesartan-hydrochlorothiazide (BENICAR HCT) 40-25 MG tablet, TAKE 1 TABLET BY MOUTH DAILY, Disp: 30 tablet, Rfl: 0   UNABLE TO FIND, MASTECTOMY BRA AND PROTHESIS DX Z90.12, Disp: 6 each, Rfl: 2   UNABLE TO FIND, 6 mastectomy prosthesis and bras., Disp: 6 each, Rfl: 0   urea (CARMOL) 10 % cream, Apply topically 2 (two) times daily. Apply twice daily to hands, Disp: 71 g, Rfl: 0 No current facility-administered medications for this visit.  Facility-Administered Medications Ordered in Other Visits:    heparin lock flush 100 unit/mL, 500 Units, Intravenous, Once, Deanna Massed, MD   sodium chloride flush (NS) 0.9 % injection 10 mL, 10 mL, Intravenous, Once, Deanna Massed, MD   Allergies: Allergies  Allergen Reactions   Amlodipine Swelling    Leg edema   Sulfonamide Derivatives Itching   Yellow Jacket Venom [Bee Venom] Rash    urticaria    REVIEW OF SYSTEMS:   Review of Systems  Constitutional:  Negative for chills, fatigue and fever.  HENT:   Negative for lump/mass, mouth sores, nosebleeds, sore throat and trouble swallowing.   Eyes:  Negative for eye problems.  Respiratory:  Negative for cough and shortness of breath.   Cardiovascular:  Negative for chest pain, leg swelling and palpitations.  Gastrointestinal:  Positive for diarrhea. Negative for abdominal pain, constipation, nausea and vomiting.  Genitourinary:  Negative for bladder incontinence, difficulty urinating, dysuria, frequency, hematuria and nocturia.   Musculoskeletal:  Negative for  arthralgias, back pain, flank pain, myalgias and neck pain.  Skin:  Negative for itching and rash.  Neurological:  Negative for dizziness, headaches and numbness.  Hematological:  Does not bruise/bleed easily.  Psychiatric/Behavioral:  Negative for depression, sleep disturbance and suicidal ideas. The patient is not nervous/anxious.   All other systems reviewed and are negative.    VITALS:   Blood pressure 125/66, pulse 69, temperature 98.9 F (37.2 C), temperature source Oral, resp. rate 17, height 5\' 6"  (1.676 m), weight 148 lb 11.2 oz (67.4 kg), SpO2 100%.  Wt Readings from Last 3 Encounters:  05/06/23 148 lb 11.2 oz (67.4 kg)  03/18/23 151 lb (68.5 kg)  03/04/23 153 lb (69.4 kg)    Body mass index is 24 kg/m.  Performance status (ECOG): 0 - Asymptomatic  PHYSICAL EXAM:   Physical Exam Vitals and nursing note reviewed. Exam conducted with a chaperone present.  Constitutional:      Appearance: Normal appearance.  Cardiovascular:     Rate and Rhythm: Normal rate and regular rhythm.     Pulses: Normal pulses.     Heart sounds: Normal heart sounds.  Pulmonary:     Effort: Pulmonary effort is normal.     Breath sounds: Normal breath sounds.  Abdominal:     Palpations: Abdomen is soft. There is  no hepatomegaly, splenomegaly or mass.     Tenderness: There is no abdominal tenderness.  Musculoskeletal:     Right lower leg: No edema.     Left lower leg: No edema.  Lymphadenopathy:     Cervical: No cervical adenopathy.     Right cervical: No superficial, deep or posterior cervical adenopathy.    Left cervical: No superficial, deep or posterior cervical adenopathy.     Upper Body:     Right upper body: No supraclavicular or axillary adenopathy.     Left upper body: No supraclavicular or axillary adenopathy.  Neurological:     General: No focal deficit present.     Mental Status: She is alert and oriented to person, place, and time.  Psychiatric:        Mood and Affect: Mood  normal.        Behavior: Behavior normal.     LABS:      Latest Ref Rng & Units 04/29/2023    8:47 AM 02/28/2023   11:05 AM 12/23/2022    1:57 PM  CBC  WBC 4.0 - 10.5 K/uL 4.2  4.6  4.1   Hemoglobin 12.0 - 15.0 g/dL 91.4  78.2  9.9   Hematocrit 36.0 - 46.0 % 36.6  35.3  30.9   Platelets 150 - 400 K/uL 220  182  193       Latest Ref Rng & Units 04/29/2023    8:47 AM 02/28/2023   11:05 AM 12/23/2022    1:57 PM  CMP  Glucose 70 - 99 mg/dL 956  99  213   BUN 8 - 23 mg/dL 22  21  12    Creatinine 0.44 - 1.00 mg/dL 0.86  5.78  4.69   Sodium 135 - 145 mmol/L 138  134  135   Potassium 3.5 - 5.1 mmol/L 3.7  3.6  3.5   Chloride 98 - 111 mmol/L 102  99  103   CO2 22 - 32 mmol/L 27  27  25    Calcium 8.9 - 10.3 mg/dL 9.4  8.8  8.5   Total Protein 6.5 - 8.1 g/dL 7.3  6.8  6.7   Total Bilirubin 0.3 - 1.2 mg/dL 0.5  0.5  0.6   Alkaline Phos 38 - 126 U/L 76  74  89   AST 15 - 41 U/L 28  24  31    ALT 0 - 44 U/L 22  20  22       No results found for: "CEA1", "CEA" / No results found for: "CEA1", "CEA" No results found for: "PSA1" No results found for: "GEX528" No results found for: "CAN125"  No results found for: "TOTALPROTELP", "ALBUMINELP", "A1GS", "A2GS", "BETS", "BETA2SER", "GAMS", "MSPIKE", "SPEI" Lab Results  Component Value Date   TIBC 307 04/29/2023   TIBC 280 02/28/2023   TIBC 282 12/23/2022   FERRITIN 152 04/29/2023   FERRITIN 85 02/28/2023   FERRITIN 92 12/23/2022   IRONPCTSAT 20 04/29/2023   IRONPCTSAT 16 02/28/2023   IRONPCTSAT 15 12/23/2022   Lab Results  Component Value Date   LDH 178 02/21/2022   LDH 152 09/27/2021     STUDIES:   CT CHEST ABDOMEN PELVIS W CONTRAST  Result Date: 04/30/2023 CLINICAL DATA:  Metastatic renal cell carcinoma, status post right nephrectomy * Tracking Code: BO * EXAM: CT CHEST, ABDOMEN, AND PELVIS WITH CONTRAST TECHNIQUE: Multidetector CT imaging of the chest, abdomen and pelvis was performed following the standard protocol during  bolus administration of intravenous  contrast. RADIATION DOSE REDUCTION: This exam was performed according to the departmental dose-optimization program which includes automated exposure control, adjustment of the mA and/or kV according to patient size and/or use of iterative reconstruction technique. CONTRAST:  OMNIPAQUE IOHEXOL 300 MG/ML  SOLN COMPARISON:  01/01/2023 FINDINGS: CT CHEST FINDINGS Cardiovascular: Right chest port catheter. Scattered aortic atherosclerosis. Normal heart size. No pericardial effusion. Mediastinum/Nodes: No enlarged mediastinal, hilar, or axillary lymph nodes. Thyroid gland, trachea, and esophagus demonstrate no significant findings. Lungs/Pleura: Unchanged trace right pleural effusion. Dependent bibasilar scarring or atelectasis. Unchanged small irregular nodule of the anterior left upper lobe measuring 0.8 x 0.6 cm (series 3, image 64) Musculoskeletal: Status post left mastectomy. No acute osseous findings. CT ABDOMEN PELVIS FINDINGS Hepatobiliary: Multiple hypodense liver lesions are not significantly changed in size, however some of these are increasingly hypodense, for example in the anterior right lobe of the liver a lesion measuring 1.2 x 1.1 cm (series 2, image 53), and in the left lobe of the liver lesion measuring 2.5 x 2.2 cm (series 2, image 50). No gallstones, gallbladder wall thickening, or biliary dilatation. Pancreas: Unremarkable. No pancreatic ductal dilatation or surrounding inflammatory changes. Spleen: Normal in size without significant abnormality. Adrenals/Urinary Tract: Status post right nephrectomy. There is generally no significant change in very extensive rim enhancing soft tissue nodularity throughout the adjacent right retroperitoneum, hepatorenal fossa, right paracolic gutter, and porta hepatis, as well as within the musculature of the overlying right flank. Index hepatorenal fossa nodule measures 5.0 x 3.6 cm (series 2, image 61). Index nodule within  the right flank measures 4.0 x 2.7 cm (series 2, image 66). Index nodule at the inferior aspect of the nephrectomy bed measures 3.2 x 3.2 cm (series 2, image 80). There are however some very slightly enlarged nodules, for example at the inferior aspect of the paracolic gutter measuring 2.7 x 1.8 cm, previously 2.2 x 1.4 cm (series 2, image 70), and there are two new small nodules in the right erector spinae musculature, measuring 1.0 cm and 0.9 cm (series 2, image 64). The left kidney is normal, without renal calculi, solid lesion, or hydronephrosis. Bladder is unremarkable. Stomach/Bowel: Stomach is within normal limits. Appendix appears normal. No evidence of bowel wall thickening, distention, or inflammatory changes. Vascular/Lymphatic: Aortic atherosclerosis. Similar, small quantity of tumor nodularity or thrombus involving the IVC at the level of the right renal vein (series 2, image 64). No enlarged abdominal or pelvic lymph nodes. Reproductive: No mass or other abnormality. Other: No abdominal wall hernia or abnormality. Small volume free fluid in the low pelvis. Musculoskeletal: No acute osseous findings. IMPRESSION: 1. Status post right nephrectomy. 2. Very extensive rim enhancing soft tissue nodularity throughout the right retroperitoneum, hepatorenal fossa, right paracolic gutter, and porta hepatis, as well as within the overlying right flank musculature. Some very slightly enlarged nodules inferiorly, as well as two new small nodules in the right erector spinae musculature, consistent with slightly worsened local disease. 3. Multiple hypodense liver parenchymal lesions are not significantly changed in size, however some of these are increasingly hypodense, suggesting treatment response of hepatic metastatic disease. No new liver lesions. 4. Similar, small quantity of tumor nodularity or thrombus involving the IVC at the level of the right renal vein. 5. Unchanged small irregular pulmonary nodule of the  anterior left upper lobe, nonspecific. Attention on follow-up. 6. Unchanged trace right pleural effusion. 7. Unchanged small volume free fluid in the low pelvis. Aortic Atherosclerosis (ICD10-I70.0). Electronically Signed   By: Trinna Post  Jane Canary M.D.   On: 04/30/2023 22:28

## 2023-05-06 ENCOUNTER — Inpatient Hospital Stay (HOSPITAL_BASED_OUTPATIENT_CLINIC_OR_DEPARTMENT_OTHER): Payer: No Typology Code available for payment source | Admitting: Hematology

## 2023-05-06 VITALS — BP 125/66 | HR 69 | Temp 98.9°F | Resp 17 | Ht 66.0 in | Wt 148.7 lb

## 2023-05-06 DIAGNOSIS — Z801 Family history of malignant neoplasm of trachea, bronchus and lung: Secondary | ICD-10-CM | POA: Insufficient documentation

## 2023-05-06 DIAGNOSIS — D649 Anemia, unspecified: Secondary | ICD-10-CM

## 2023-05-06 DIAGNOSIS — Z905 Acquired absence of kidney: Secondary | ICD-10-CM | POA: Insufficient documentation

## 2023-05-06 DIAGNOSIS — R634 Abnormal weight loss: Secondary | ICD-10-CM | POA: Insufficient documentation

## 2023-05-06 DIAGNOSIS — Z9012 Acquired absence of left breast and nipple: Secondary | ICD-10-CM | POA: Insufficient documentation

## 2023-05-06 DIAGNOSIS — E039 Hypothyroidism, unspecified: Secondary | ICD-10-CM | POA: Diagnosis not present

## 2023-05-06 DIAGNOSIS — R918 Other nonspecific abnormal finding of lung field: Secondary | ICD-10-CM | POA: Insufficient documentation

## 2023-05-06 DIAGNOSIS — D509 Iron deficiency anemia, unspecified: Secondary | ICD-10-CM | POA: Diagnosis not present

## 2023-05-06 DIAGNOSIS — I129 Hypertensive chronic kidney disease with stage 1 through stage 4 chronic kidney disease, or unspecified chronic kidney disease: Secondary | ICD-10-CM | POA: Insufficient documentation

## 2023-05-06 DIAGNOSIS — N189 Chronic kidney disease, unspecified: Secondary | ICD-10-CM | POA: Diagnosis not present

## 2023-05-06 DIAGNOSIS — Z87891 Personal history of nicotine dependence: Secondary | ICD-10-CM | POA: Insufficient documentation

## 2023-05-06 DIAGNOSIS — Z800 Family history of malignant neoplasm of digestive organs: Secondary | ICD-10-CM | POA: Insufficient documentation

## 2023-05-06 DIAGNOSIS — C641 Malignant neoplasm of right kidney, except renal pelvis: Secondary | ICD-10-CM | POA: Diagnosis present

## 2023-05-06 DIAGNOSIS — C787 Secondary malignant neoplasm of liver and intrahepatic bile duct: Secondary | ICD-10-CM | POA: Diagnosis not present

## 2023-05-06 DIAGNOSIS — Z803 Family history of malignant neoplasm of breast: Secondary | ICD-10-CM | POA: Diagnosis not present

## 2023-05-06 DIAGNOSIS — Z8544 Personal history of malignant neoplasm of other female genital organs: Secondary | ICD-10-CM | POA: Diagnosis not present

## 2023-05-06 DIAGNOSIS — D631 Anemia in chronic kidney disease: Secondary | ICD-10-CM | POA: Diagnosis not present

## 2023-05-06 DIAGNOSIS — Z8042 Family history of malignant neoplasm of prostate: Secondary | ICD-10-CM | POA: Insufficient documentation

## 2023-05-06 DIAGNOSIS — Z8051 Family history of malignant neoplasm of kidney: Secondary | ICD-10-CM | POA: Insufficient documentation

## 2023-05-06 DIAGNOSIS — R197 Diarrhea, unspecified: Secondary | ICD-10-CM | POA: Diagnosis not present

## 2023-05-06 NOTE — Patient Instructions (Addendum)
[facility]  Discharge Instructions  You were seen and examined today by Dr. Ellin Saba.  Your CT scan is stable. Continue Lenvima and everolimus as prescribed.  He reviewed the results of your lab work which are normal/stable.   We will see you back in . We will repeat lab work prior to this visit.   Return as scheduled.    Thank you for choosing [facility] to provide your oncology and hematology care.   To afford each patient quality time with our provider, please arrive at least 15 minutes before your scheduled appointment time. You may need to reschedule your appointment if you arrive late (10 or more minutes). Arriving late affects you and other patients whose appointments are after yours.  Also, if you miss three or more appointments without notifying the office, you may be dismissed from the clinic at the provider's discretion.    Again, thank you for choosing [facility].  Our hope is that these requests will decrease the amount of time that you wait before being seen by our physicians.   If you have a lab appointment with the Cancer Center - please note that after April 8th, all labs will be drawn in the cancer center.  You do not have to check in or register with the main entrance as you have in the past but will complete your check-in at the cancer center.            _____________________________________________________________  Should you have questions after your visit to [facility], please contact our office at [phone] and follow the prompts.  Our office hours are 8:00 a.m. to 4:30 p.m. Monday - Thursday and 8:00 a.m. to 2:30 p.m. Friday.  Please note that voicemails left after 4:00 p.m. may not be returned until the following business day.  We are closed weekends and all major holidays.  You do have access to a nurse 24-7, just call the main number to the clinic [phone] and do not  press any options, hold on the line and a nurse will answer the phone.    For prescription refill requests, have your pharmacy contact our office and allow 72 hours.    Masks are no longer required in the cancer centers. If you would like for your care team to wear a mask while they are taking care of you, please let them know. You may have one support person who is at least 64 years old accompany you for your appointments.

## 2023-05-06 NOTE — Progress Notes (Signed)
Patient is taking lenvatinib and everolimus as prescribed.  She has not missed any doses and reports no side effects at this time.

## 2023-05-29 ENCOUNTER — Other Ambulatory Visit: Payer: Self-pay

## 2023-05-29 ENCOUNTER — Telehealth: Payer: Self-pay | Admitting: Family Medicine

## 2023-05-29 DIAGNOSIS — E782 Mixed hyperlipidemia: Secondary | ICD-10-CM

## 2023-05-29 MED ORDER — OLMESARTAN MEDOXOMIL-HCTZ 40-25 MG PO TABS: 1.0000 | ORAL_TABLET | Freq: Every day | ORAL | 0 refills | Status: DC

## 2023-05-29 MED ORDER — ATORVASTATIN CALCIUM 20 MG PO TABS
20.0000 mg | ORAL_TABLET | Freq: Every day | ORAL | 1 refills | Status: DC
Start: 2023-05-29 — End: 2023-07-28

## 2023-05-29 NOTE — Telephone Encounter (Signed)
Refills sent to pharmacy. 

## 2023-05-29 NOTE — Telephone Encounter (Signed)
Prescription Request  05/29/2023  LOV: 02/06/2023  What is the name of the medication or equipment? olmesartan-hydrochlorothiazide (BENICAR HCT) 40-25 MG tablet [[id number]]  atorvastatin (LIPITOR) 20 MG tablet [[id number]]    Have you contacted your pharmacy to request a refill? Yes   Which pharmacy would you like this sent to? [facility]  Patient notified that their request is being sent to the clinical staff for review and that they should receive a response within 2 business days.   Please advise at [facility] [phone]

## 2023-05-30 NOTE — H&P (Signed)
Surgical History & Physical  Patient Name: Deanna Bush  DOB: 06-30-1961  Surgery: Cataract extraction with intraocular lens implant phacoemulsification; Right Eye Surgeon: Fabio Pierce MD Surgery Date: 06/06/2023 Pre-Op Date: 05/26/2023  HPI: A 75 Yr. old female patient presents for a cataract evaluation. Patient reports that she believes the vision OD is worse than the vision OS. The patient complains of difficulty when driving, which began for an unknown amount of time. Both eyes are affected. The episode is intermittent. The condition is worse with daily activities and with intensive visual activity (reading, computer). The complaint is associated with blurry vision, glare, halos and light sensitivity. This is negatively affecting the patient's quality of life and the patient is unable to function adequately in life with the current level of vision. The patient experiences no eye pain. HPI was performed by Fabio Pierce .  Medical History: Cataracts  Cancer High Blood Pressure LDL Thyroid Problems  Review of Systems Cardiovascular High Blood Pressure Endocrine Hyperthyroidism All recorded systems are negative except as noted above.  Social Former smoker  Medication benzonatate ,  amoxicillin-pot clavulanate ,  prednisone , Tylenol, Atorvastatin, Epi pen, Afinitor, Iron, Lenvima, Synthroid, Multivitamin  Sx/Procedures None  Drug Allergies  Sulfa (Sulfonamide Antibiotics)   History & Physical: Heent: cataracts NECK: supple without bruits LUNGS: lungs clear to auscultation CV: regular rate and rhythm Abdomen: soft and non-tender  Impression & Plan: Assessment: 1.  COMBINED FORMS AGE RELATED CATARACT; Both Eyes (H25.813) 2.  BLEPHARITIS; Right Upper Lid, Right Lower Lid, Left Upper Lid, Left Lower Lid (H01.001, H01.002,H01.004,H01.005) 3.  CONJUNCTIVOCHALASIS; Both Eyes (H11.823) 4.  ASTIGMATISM, REGULAR; Both Eyes (H52.223)  Plan: 1.  Cataract accounts for the  patient's decreased vision. This visual impairment is not correctable with a tolerable change in glasses or contact lenses. Cataract surgery with an implantation of a new lens should significantly improve the visual and functional status of the patient. Discussed all risks, benefits, alternatives, and potential complications. Discussed the procedures and recovery. Patient desires to have surgery. A-scan ordered and performed today for intra-ocular lens calculations. The surgery will be performed in order to improve vision for driving, reading, and for eye examinations. Recommend phacoemulsification with intra-ocular lens. Recommend Dextenza for post-operative pain and inflammation. Right Eye worse - first. Dilates well - shugarcaine by protocol. recommend toric IOL.  2.  Blepharitis is present - recommend regular lid cleaning.  3.  Discussed condition and symptoms and is considered a type of Dry Eye Disease called "mechanical dry eye" caused by excessive tissue on the eye that is rubbed by the eyelids.  4.  Recommend toric IOL OU.

## 2023-06-02 ENCOUNTER — Encounter (HOSPITAL_COMMUNITY): Payer: Self-pay

## 2023-06-02 ENCOUNTER — Encounter (HOSPITAL_COMMUNITY)
Admission: RE | Admit: 2023-06-02 | Discharge: 2023-06-02 | Disposition: A | Discharge status: Home / Self Care | Payer: PRIVATE HEALTH INSURANCE | Source: Ambulatory Visit | Attending: Ophthalmology | Admitting: Ophthalmology

## 2023-06-02 ENCOUNTER — Other Ambulatory Visit: Payer: Self-pay | Admitting: Family Medicine

## 2023-06-02 HISTORY — DX: Depression, unspecified: F32.A

## 2023-06-02 MED ORDER — OLMESARTAN-AMLODIPINE-HCTZ 40-5-25 MG PO TABS: ORAL_TABLET | ORAL | 3 refills | Status: DC

## 2023-06-02 MED ORDER — OLMESARTAN MEDOXOMIL-HCTZ 40-25 MG PO TABS: 1.0000 | ORAL_TABLET | Freq: Every day | ORAL | 3 refills | Status: DC

## 2023-06-04 ENCOUNTER — Telehealth: Payer: Self-pay | Admitting: Family Medicine

## 2023-06-04 ENCOUNTER — Other Ambulatory Visit: Payer: Self-pay

## 2023-06-04 MED ORDER — OLMESARTAN MEDOXOMIL-HCTZ 40-25 MG PO TABS: 1.0000 | ORAL_TABLET | Freq: Every day | ORAL | 3 refills | Status: DC

## 2023-06-04 NOTE — Telephone Encounter (Signed)
Pt came by office in regard to olmesartan-hydrochlorothiazide (BENICAR HCT) 40-25 MG tablet [[id number]]  Incorrect med was sent to pharm and now pharm will not fill patients medication.  Pt needs correct med sent in  olmesartan-hydrochlorothiazide [facility] HCT) 40-25 MG tablet [[id number]]

## 2023-06-04 NOTE — Telephone Encounter (Signed)
Refill sent.

## 2023-06-06 ENCOUNTER — Ambulatory Visit (HOSPITAL_BASED_OUTPATIENT_CLINIC_OR_DEPARTMENT_OTHER): Payer: 59 | Admitting: Anesthesiology

## 2023-06-06 ENCOUNTER — Encounter (HOSPITAL_COMMUNITY): Payer: Self-pay | Admitting: Ophthalmology

## 2023-06-06 ENCOUNTER — Encounter (HOSPITAL_COMMUNITY)
Admission: RE | Disposition: A | Discharge status: Home / Self Care | Payer: Self-pay | Source: Home / Self Care | Attending: Ophthalmology

## 2023-06-06 ENCOUNTER — Ambulatory Visit (HOSPITAL_COMMUNITY)
Admission: RE | Admit: 2023-06-06 | Discharge: 2023-06-06 | Disposition: A | Discharge status: Home / Self Care | Payer: 59 | Attending: Ophthalmology | Admitting: Ophthalmology

## 2023-06-06 ENCOUNTER — Ambulatory Visit (HOSPITAL_COMMUNITY): Payer: 59 | Admitting: Anesthesiology

## 2023-06-06 DIAGNOSIS — E1136 Type 2 diabetes mellitus with diabetic cataract: Secondary | ICD-10-CM | POA: Insufficient documentation

## 2023-06-06 DIAGNOSIS — H25811 Combined forms of age-related cataract, right eye: Secondary | ICD-10-CM

## 2023-06-06 DIAGNOSIS — Z905 Acquired absence of kidney: Secondary | ICD-10-CM | POA: Insufficient documentation

## 2023-06-06 DIAGNOSIS — H11823 Conjunctivochalasis, bilateral: Secondary | ICD-10-CM | POA: Diagnosis not present

## 2023-06-06 DIAGNOSIS — H52223 Regular astigmatism, bilateral: Secondary | ICD-10-CM | POA: Insufficient documentation

## 2023-06-06 DIAGNOSIS — H0100B Unspecified blepharitis left eye, upper and lower eyelids: Secondary | ICD-10-CM | POA: Diagnosis not present

## 2023-06-06 DIAGNOSIS — I10 Essential (primary) hypertension: Secondary | ICD-10-CM | POA: Diagnosis not present

## 2023-06-06 DIAGNOSIS — Z87891 Personal history of nicotine dependence: Secondary | ICD-10-CM | POA: Insufficient documentation

## 2023-06-06 DIAGNOSIS — Z853 Personal history of malignant neoplasm of breast: Secondary | ICD-10-CM | POA: Insufficient documentation

## 2023-06-06 DIAGNOSIS — H0100A Unspecified blepharitis right eye, upper and lower eyelids: Secondary | ICD-10-CM | POA: Diagnosis not present

## 2023-06-06 HISTORY — PX: CATARACT EXTRACTION W/PHACO: SHX586

## 2023-06-06 SURGERY — PHACOEMULSIFICATION, CATARACT, WITH IOL INSERTION
Anesthesia: Monitor Anesthesia Care | Site: Eye | Laterality: Right

## 2023-06-06 MED ORDER — BSS IO SOLN
INTRAOCULAR | Status: DC | PRN
  Administered 2023-06-06: 15 mL via INTRAOCULAR

## 2023-06-06 MED ORDER — MIDAZOLAM HCL 2 MG/2ML IJ SOLN
INTRAMUSCULAR | Status: AC
  Filled 2023-06-06: qty 2

## 2023-06-06 MED ORDER — LIDOCAINE HCL 3.5 % OP GEL
1.0000 | Freq: Once | OPHTHALMIC | Status: AC
  Administered 2023-06-06: 1 via OPHTHALMIC

## 2023-06-06 MED ORDER — LIDOCAINE HCL (PF) 1 % IJ SOLN
INTRAOCULAR | Status: DC | PRN
  Administered 2023-06-06: 1 mL via OPHTHALMIC

## 2023-06-06 MED ORDER — POVIDONE-IODINE 5 % OP SOLN
OPHTHALMIC | Status: DC | PRN
  Administered 2023-06-06: 1 via OPHTHALMIC

## 2023-06-06 MED ORDER — SODIUM HYALURONATE 10 MG/ML IO SOLUTION
PREFILLED_SYRINGE | INTRAOCULAR | Status: DC | PRN
  Administered 2023-06-06: .85 mL via INTRAOCULAR

## 2023-06-06 MED ORDER — MOXIFLOXACIN HCL 5 MG/ML IO SOLN
INTRAOCULAR | Status: DC | PRN
  Administered 2023-06-06: .3 mL via OPHTHALMIC

## 2023-06-06 MED ORDER — MIDAZOLAM HCL 2 MG/2ML IJ SOLN
INTRAMUSCULAR | Status: DC | PRN
  Administered 2023-06-06: 2 mg via INTRAVENOUS

## 2023-06-06 MED ORDER — SODIUM HYALURONATE 23MG/ML IO SOSY
PREFILLED_SYRINGE | INTRAOCULAR | Status: DC | PRN
  Administered 2023-06-06: .6 mL via INTRAOCULAR

## 2023-06-06 MED ORDER — TETRACAINE HCL 0.5 % OP SOLN
1.0000 [drp] | OPHTHALMIC | Status: AC | PRN
  Administered 2023-06-06 (×3): 1 [drp] via OPHTHALMIC

## 2023-06-06 MED ORDER — PHENYLEPHRINE HCL 2.5 % OP SOLN
1.0000 [drp] | OPHTHALMIC | Status: AC | PRN
  Administered 2023-06-06 (×3): 1 [drp] via OPHTHALMIC

## 2023-06-06 MED ORDER — STERILE WATER FOR IRRIGATION IR SOLN
Status: DC | PRN
  Administered 2023-06-06: 1000 mL

## 2023-06-06 MED ORDER — TROPICAMIDE 1 % OP SOLN
1.0000 [drp] | OPHTHALMIC | Status: AC | PRN
  Administered 2023-06-06 (×3): 1 [drp] via OPHTHALMIC

## 2023-06-06 MED ORDER — EPINEPHRINE PF 1 MG/ML IJ SOLN
INTRAOCULAR | Status: DC | PRN
  Administered 2023-06-06: 500 mL

## 2023-06-06 SURGICAL SUPPLY — 13 items
CATARACT SUITE SIGHTPATH (MISCELLANEOUS) ×1
CLOTH BEACON ORANGE TIMEOUT ST (SAFETY) ×2 IMPLANT
EYE SHIELD UNIVERSAL CLEAR (GAUZE/BANDAGES/DRESSINGS) IMPLANT
FEE CATARACT SUITE SIGHTPATH (MISCELLANEOUS) ×2 IMPLANT
GLOVE BIOGEL PI IND STRL 7.0 (GLOVE) ×4 IMPLANT
LENS IOL TECNIS EYHANCE 20.5 (Intraocular Lens) IMPLANT
NDL HYPO 18GX1.5 BLUNT FILL (NEEDLE) ×2 IMPLANT
NEEDLE HYPO 18GX1.5 BLUNT FILL (NEEDLE) ×1
PAD ARMBOARD 7.5X6 YLW CONV (MISCELLANEOUS) ×2 IMPLANT
POSITIONER HEAD 8X9X4 ADT (SOFTGOODS) ×2 IMPLANT
SYR TB 1ML LL NO SAFETY (SYRINGE) ×2 IMPLANT
TAPE SURG TRANSPORE 1 IN (GAUZE/BANDAGES/DRESSINGS) IMPLANT
WATER STERILE IRR 250ML POUR (IV SOLUTION) ×2 IMPLANT

## 2023-06-06 NOTE — Anesthesia Preprocedure Evaluation (Addendum)
Anesthesia Evaluation  Patient identified by MRN, date of birth, ID band Patient awake    Reviewed: Allergy & Precautions, NPO status , Patient's Chart, lab work & pertinent test results, reviewed documented beta blocker date and time   Airway Mallampati: I   Neck ROM: Full    Dental  (+) Teeth Intact   Pulmonary former smoker   breath sounds clear to auscultation       Cardiovascular hypertension, Pt. on medications  Rhythm:Regular Rate:Normal     Neuro/Psych    GI/Hepatic   Endo/Other  diabetes, Well Controlled, Type 2    Renal/GU Renal disease     Musculoskeletal   Abdominal   Peds  Hematology   Anesthesia Other Findings Nephrectomy 2022, Hx breast ca  Reproductive/Obstetrics                             Anesthesia Physical Anesthesia Plan  ASA: 3  Anesthesia Plan: MAC   Post-op Pain Management:    Induction:   PONV Risk Score and Plan: 1  Airway Management Planned: Nasal Cannula  Additional Equipment:   Intra-op Plan:   Post-operative Plan:   Informed Consent:   Plan Discussed with: CRNA and Anesthesiologist  Anesthesia Plan Comments:        Anesthesia Quick Evaluation

## 2023-06-06 NOTE — Anesthesia Postprocedure Evaluation (Signed)
Anesthesia Post Note  Patient: Deanna Bush  Procedure(s) Performed: CATARACT EXTRACTION PHACO AND INTRAOCULAR LENS PLACEMENT (IOC) (Right: Eye)  Patient location during evaluation: Phase II Anesthesia Type: MAC Level of consciousness: awake Pain management: pain level controlled Vital Signs Assessment: post-procedure vital signs reviewed and stable Respiratory status: spontaneous breathing and respiratory function stable Cardiovascular status: blood pressure returned to baseline and stable Postop Assessment: no headache and no apparent nausea or vomiting Anesthetic complications: no Comments: Late entry   No notable events documented.   Last Vitals:  Vitals:   06/06/23 0653 06/06/23 0912  BP: 131/65 (!) 177/85  Pulse: 67 (!) 58  Resp: 14 14  Temp: 36.8 C 36.8 C  SpO2: 100% 100%    Last Pain:  Vitals:   06/06/23 0912  TempSrc: Oral  PainSc: 0-No pain                 Windell Norfolk

## 2023-06-06 NOTE — Interval H&P Note (Signed)
History and Physical Interval Note:  06/06/2023 8:42 AM  Deanna Bush  has presented today for surgery, with the diagnosis of Combined forms age related cataract, right eye.  The various methods of treatment have been discussed with the patient and family. After consideration of risks, benefits and other options for treatment, the patient has consented to  Procedure(s): CATARACT EXTRACTION PHACO AND INTRAOCULAR LENS PLACEMENT (IOC) (Right) as a surgical intervention.  [name] history has been reviewed, patient examined, no change in status, stable for surgery.  I have reviewed the patient's chart and labs.  Questions were answered to the patient's satisfaction.     Fabio Pierce

## 2023-06-06 NOTE — Discharge Instructions (Signed)
Please discharge patient when stable, will follow up today with Dr. Wrzosek at the [facility] office immediately following discharge.  Leave shield in place until visit.  All paperwork with discharge instructions will be given at the office.  [facility] Address:  [address]  [place], [place] [address]  

## 2023-06-06 NOTE — Op Note (Signed)
Date of procedure: 06/06/23  Pre-operative diagnosis:  Visually significant combined form age-related cataract, Right Eye (H25.811)  Post-operative diagnosis:  Visually significant combined form age-related cataract, Right Eye (H25.811)  Procedure: Removal of cataract via phacoemulsification and insertion of intra-ocular lens Laural Benes and Johnson DIB00 +20.5D into the capsular bag of the Right Eye  Attending surgeon: Rudy Jew. Dimonique Bourdeau, MD, MA  Anesthesia: MAC, Topical Akten  Complications: None  Estimated Blood Loss: <57mL (minimal)  Specimens: None  Implants: As above  Indications:  Visually significant age-related cataract, Right Eye  Procedure:  The patient was seen and identified in the pre-operative area. The operative eye was identified and dilated.  The operative eye was marked.  Topical anesthesia was administered to the operative eye.     The patient was then to the operative suite and placed in the supine position.  A timeout was performed confirming the patient, procedure to be performed, and all other relevant information.   [name] face was prepped and draped in the usual fashion for intra-ocular surgery.  A lid speculum was placed into the operative eye and the surgical microscope moved into place and focused.  A superotemporal paracentesis was created using a 20 gauge paracentesis blade.  Shugarcaine was injected into the anterior chamber.  Viscoelastic was injected into the anterior chamber.  A temporal clear-corneal main wound incision was created using a 2.10mm microkeratome.  A continuous curvilinear capsulorrhexis was initiated using an irrigating cystitome and completed using capsulorrhexis forceps.  Hydrodissection and hydrodeliniation were performed.  Viscoelastic was injected into the anterior chamber.  A phacoemulsification handpiece and a chopper as a second instrument were used to remove the nucleus and epinucleus. The irrigation/aspiration handpiece was used to  remove any remaining cortical material.   The capsular bag was reinflated with viscoelastic, checked, and found to be intact.  The intraocular lens was inserted into the capsular bag.  The irrigation/aspiration handpiece was used to remove any remaining viscoelastic.  The clear corneal wound and paracentesis wounds were then hydrated and checked with Weck-Cels to be watertight. 0.74mL of Moxfloxacin was injected into the anterior chamber. The lid-speculum was removed.  The drape was removed.  [name] face was cleaned with a wet and dry 4x4. A clear shield was taped over the eye. The patient was taken to the post-operative care unit in good condition, having tolerated the procedure well.  Post-Op Instructions: The patient will follow up at [facility] for a same day post-operative evaluation and will receive all other orders and instructions.

## 2023-06-06 NOTE — Transfer of Care (Signed)
Immediate Anesthesia Transfer of Care Note  Patient: Deanna Bush  Procedure(s) Performed: CATARACT EXTRACTION PHACO AND INTRAOCULAR LENS PLACEMENT (IOC) (Right: Eye)  Patient Location: Short Stay  Anesthesia Type:MAC  Level of Consciousness: awake, alert , oriented, and patient cooperative  Airway & Oxygen Therapy: Patient Spontanous Breathing  Post-op Assessment: Report given to RN, Post -op Vital signs reviewed and stable, and Patient moving all extremities X 4  Post vital signs: Reviewed and stable  Last Vitals:  Vitals Value Taken Time  BP    Temp    Pulse    Resp    SpO2      Last Pain:  Vitals:   06/06/23 0653  TempSrc: Oral  PainSc: 0-No pain         Complications: No notable events documented.

## 2023-06-10 ENCOUNTER — Encounter (HOSPITAL_COMMUNITY): Payer: Self-pay | Admitting: Ophthalmology

## 2023-06-11 ENCOUNTER — Ambulatory Visit (HOSPITAL_COMMUNITY): Payer: 59 | Admitting: Psychiatry

## 2023-06-11 ENCOUNTER — Other Ambulatory Visit: Payer: Self-pay

## 2023-06-11 ENCOUNTER — Telehealth: Payer: Self-pay | Admitting: Family Medicine

## 2023-06-11 DIAGNOSIS — F4323 Adjustment disorder with mixed anxiety and depressed mood: Secondary | ICD-10-CM

## 2023-06-11 MED ORDER — OLMESARTAN-AMLODIPINE-HCTZ 40-5-25 MG PO TABS: ORAL_TABLET | ORAL | 3 refills | Status: DC

## 2023-06-11 NOTE — Progress Notes (Signed)
IN-PERSON  THERAPIST PROGRESS NOTE  Session Time: Thursday  06/11/2023 4:05 PM -   4:45 PM     Participation Level: Active  Behavioral Response: CasualAlert/sad, anxious,tearful at times  Type of Therapy: Individual Therapy  Treatment Goals addressed: Have healthy grieving process around loss Begin verbalizing feelings associated with loss     ProgressTowards Goals: Progressing  Interventions: Supportive  Summary: Deanna Bush is a 62 y.o. female who p is referred for services by PCP Dr. Lodema Hong due to pt experiencing symptoms of anxiety along with grief and loss issues. Pt denies any psychiatric hospitalizations. Pt has no previous involvement in outpatient therapy.  Per patient's report her stepdaughter was diagnosed with colon cancer at age 34 and died in October 26, 2022.  Patient was her caretaker and says patient died at her home.  Patient reports her husband left her about 2 weeks ago for 51 year old after 30 years of marriage.  Patient reports additional stress regarding providing care for her 9 year old mother.  Patient also has her own health issues as she was diagnosed with kidney cancer 2 years ago and had chemo.  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 She currently is taking experimental drugs.  Patient reports crying spells, sadness, irritability, worry, anxiety, and sleeping difficulty.   Patient last was seen about 6 weeks  ago .  She reports continued stress regarding dissolution of her marriage and the effects on her as well as her family.  However, she states coping better. She has been maintaining involvement in activities including working and attending church.  She reports increased social involvement attending family get-togethers and recently going to the beach with family.  She reports being relieved her as a Clinical research associate has filed papers for divorce and she now is waiting for her husband to be served with the legal paperwork.  She reports feeling  more comfortable regarding sharing or not sharing information about her separation with others who may not know about the separation.  She reports having more realistic expectations and beginning to redefine her role regarding any interaction with husband.  She has avoided contact with him as much as possible.  She expresses concern about her youngest son who is having a very difficult time with the separation as his father has little to no contact with his son.  Patient expresses anger regarding this.  Patient reports additional stress related to recent CT scan that indicated cancer has returned in her liver.  She is scheduled for another CT scan next month.    Suicidal/Homicidal: Nowithout intent/plan  Therapist Response: Reviewed symptoms, praised and reinforced patient's involvement in activity and consistency, patient reinforced patient's efforts to have more realistic expectations regarding interaction with husband, discussed effects, continued to discuss stage I of divorce particularly the task of identifying support and help in managing practical obstacles and challenges, assisted patient identify support system, also began to discuss ways to nonfunctioning the responsibility, practical reality, and legal matters, also discussed managing emotions, praised and reinforced patient's efforts to identify and verbalize feelings related to the divorce, developed plan with patient to review materials on stage III, facilitated patient expressing thoughts and feelings regarding recent CT findings, validated feelings, assisted patient identify ways to use her support system  Plan: Return again in 2 weeks.  Diagnosis: Adjustment disorder with mixed anxiety and depressed mood  Collaboration of Care: Primary Care Provider AEB patient is seeing PCP Dr. [name] for medication management  Patient/Guardian  was advised Release of Information must be obtained prior to any record release in order to  collaborate their care with an outside provider. Patient/Guardian was advised if they have not already done so to contact the registration department to sign all necessary forms in order for [place] to release information regarding their care.   Consent: Patient/Guardian gives verbal consent for treatment and assignment of benefits for services provided during this visit. Patient/Guardian expressed understanding and agreed to proceed.   Adah Salvage, LCSW 06/11/2023

## 2023-06-11 NOTE — Telephone Encounter (Signed)
Pt is needing Olmesartan-amLODIPine-HCTZ 40-5-25 MG TABS    Sent to [facility] on [address] , [facility] , pt has 2 pills left

## 2023-06-11 NOTE — Telephone Encounter (Signed)
Refills sent

## 2023-06-11 NOTE — Addendum Note (Signed)
Addended by: Florencia Reasons E on: 06/11/2023 05:05 PM   Modules accepted: Level of Service

## 2023-06-18 ENCOUNTER — Other Ambulatory Visit: Payer: Self-pay

## 2023-06-18 MED ORDER — OLMESARTAN MEDOXOMIL-HCTZ 40-25 MG PO TABS: 1.0000 | ORAL_TABLET | Freq: Every day | ORAL | 3 refills | Status: DC

## 2023-06-19 ENCOUNTER — Other Ambulatory Visit: Payer: Self-pay | Admitting: *Deleted

## 2023-06-19 DIAGNOSIS — C641 Malignant neoplasm of right kidney, except renal pelvis: Secondary | ICD-10-CM

## 2023-06-19 MED ORDER — LENVATINIB (18 MG DAILY DOSE) 10 MG & 2 X 4 MG PO CPPK: ORAL_CAPSULE | ORAL | 1 refills | Status: DC

## 2023-06-25 ENCOUNTER — Ambulatory Visit (HOSPITAL_COMMUNITY): Payer: No Typology Code available for payment source | Admitting: Psychiatry

## 2023-07-08 ENCOUNTER — Other Ambulatory Visit (HOSPITAL_COMMUNITY): Payer: Self-pay

## 2023-07-09 ENCOUNTER — Ambulatory Visit (HOSPITAL_COMMUNITY): Payer: No Typology Code available for payment source | Admitting: Psychiatry

## 2023-07-13 ENCOUNTER — Other Ambulatory Visit: Payer: Self-pay | Admitting: Family Medicine

## 2023-07-15 ENCOUNTER — Ambulatory Visit (HOSPITAL_COMMUNITY)
Admission: RE | Admit: 2023-07-15 | Discharge: 2023-07-15 | Disposition: A | Discharge status: Home / Self Care | Payer: 59 | Source: Ambulatory Visit | Attending: Hematology | Admitting: Hematology

## 2023-07-15 ENCOUNTER — Inpatient Hospital Stay: Payer: 59 | Attending: Hematology

## 2023-07-15 DIAGNOSIS — D631 Anemia in chronic kidney disease: Secondary | ICD-10-CM | POA: Diagnosis not present

## 2023-07-15 DIAGNOSIS — R197 Diarrhea, unspecified: Secondary | ICD-10-CM | POA: Insufficient documentation

## 2023-07-15 DIAGNOSIS — Z87891 Personal history of nicotine dependence: Secondary | ICD-10-CM | POA: Insufficient documentation

## 2023-07-15 DIAGNOSIS — Z8042 Family history of malignant neoplasm of prostate: Secondary | ICD-10-CM | POA: Diagnosis not present

## 2023-07-15 DIAGNOSIS — R918 Other nonspecific abnormal finding of lung field: Secondary | ICD-10-CM | POA: Insufficient documentation

## 2023-07-15 DIAGNOSIS — D509 Iron deficiency anemia, unspecified: Secondary | ICD-10-CM | POA: Insufficient documentation

## 2023-07-15 DIAGNOSIS — C641 Malignant neoplasm of right kidney, except renal pelvis: Secondary | ICD-10-CM | POA: Insufficient documentation

## 2023-07-15 DIAGNOSIS — D649 Anemia, unspecified: Secondary | ICD-10-CM

## 2023-07-15 DIAGNOSIS — Z8051 Family history of malignant neoplasm of kidney: Secondary | ICD-10-CM | POA: Insufficient documentation

## 2023-07-15 DIAGNOSIS — Z800 Family history of malignant neoplasm of digestive organs: Secondary | ICD-10-CM | POA: Insufficient documentation

## 2023-07-15 DIAGNOSIS — Z905 Acquired absence of kidney: Secondary | ICD-10-CM | POA: Diagnosis not present

## 2023-07-15 DIAGNOSIS — Z801 Family history of malignant neoplasm of trachea, bronchus and lung: Secondary | ICD-10-CM | POA: Insufficient documentation

## 2023-07-15 DIAGNOSIS — I129 Hypertensive chronic kidney disease with stage 1 through stage 4 chronic kidney disease, or unspecified chronic kidney disease: Secondary | ICD-10-CM | POA: Insufficient documentation

## 2023-07-15 DIAGNOSIS — Z803 Family history of malignant neoplasm of breast: Secondary | ICD-10-CM | POA: Insufficient documentation

## 2023-07-15 DIAGNOSIS — E039 Hypothyroidism, unspecified: Secondary | ICD-10-CM | POA: Diagnosis not present

## 2023-07-15 DIAGNOSIS — Z9012 Acquired absence of left breast and nipple: Secondary | ICD-10-CM | POA: Insufficient documentation

## 2023-07-15 DIAGNOSIS — Z79899 Other long term (current) drug therapy: Secondary | ICD-10-CM | POA: Diagnosis not present

## 2023-07-15 DIAGNOSIS — N189 Chronic kidney disease, unspecified: Secondary | ICD-10-CM | POA: Diagnosis not present

## 2023-07-15 DIAGNOSIS — Z853 Personal history of malignant neoplasm of breast: Secondary | ICD-10-CM | POA: Diagnosis not present

## 2023-07-15 DIAGNOSIS — C787 Secondary malignant neoplasm of liver and intrahepatic bile duct: Secondary | ICD-10-CM | POA: Insufficient documentation

## 2023-07-15 LAB — COMPREHENSIVE METABOLIC PANEL
ALT: 17 U/L (ref 0–44)
AST: 24 U/L (ref 15–41)
Albumin: 3.5 g/dL (ref 3.5–5.0)
Alkaline Phosphatase: 69 U/L (ref 38–126)
Anion gap: 11 (ref 5–15)
BUN: 18 mg/dL (ref 8–23)
CO2: 26 mmol/L (ref 22–32)
Calcium: 9.4 mg/dL (ref 8.9–10.3)
Chloride: 100 mmol/L (ref 98–111)
Creatinine, Ser: 1.13 mg/dL — ABNORMAL HIGH (ref 0.44–1.00)
GFR, Estimated: 55 mL/min — ABNORMAL LOW (ref >60)
Glucose, Bld: 112 mg/dL — ABNORMAL HIGH (ref 70–99)
Potassium: 3.9 mmol/L (ref 3.5–5.1)
Sodium: 137 mmol/L (ref 135–145)
Total Bilirubin: 0.5 mg/dL (ref 0.3–1.2)
Total Protein: 7.2 g/dL (ref 6.5–8.1)

## 2023-07-15 LAB — CBC WITH DIFFERENTIAL/PLATELET
Abs Immature Granulocytes: 0.01 10*3/uL (ref 0.00–0.07)
Basophils Absolute: 0 10*3/uL (ref 0.0–0.1)
Basophils Relative: 0 %
Eosinophils Absolute: 0 10*3/uL (ref 0.0–0.5)
Eosinophils Relative: 1 %
HCT: 37.1 % (ref 36.0–46.0)
Hemoglobin: 11.8 g/dL — ABNORMAL LOW (ref 12.0–15.0)
Immature Granulocytes: 0 %
Lymphocytes Relative: 33 %
Lymphs Abs: 1.6 10*3/uL (ref 0.7–4.0)
MCH: 29.4 pg (ref 26.0–34.0)
MCHC: 31.8 g/dL (ref 30.0–36.0)
MCV: 92.5 fL (ref 80.0–100.0)
Monocytes Absolute: 0.2 10*3/uL (ref 0.1–1.0)
Monocytes Relative: 5 %
Neutro Abs: 2.9 10*3/uL (ref 1.7–7.7)
Neutrophils Relative %: 61 %
Platelets: 294 10*3/uL (ref 150–400)
RBC: 4.01 MIL/uL (ref 3.87–5.11)
RDW: 13.3 % (ref 11.5–15.5)
WBC: 4.7 10*3/uL (ref 4.0–10.5)
nRBC: 0 % (ref 0.0–0.2)

## 2023-07-15 LAB — IRON AND TIBC
Iron: 65 ug/dL (ref 28–170)
Saturation Ratios: 21 % (ref 10.4–31.8)
TIBC: 308 ug/dL (ref 250–450)
UIBC: 243 ug/dL

## 2023-07-15 LAB — FERRITIN: Ferritin: 180 ng/mL (ref 11–307)

## 2023-07-15 LAB — LACTATE DEHYDROGENASE: LDH: 178 U/L (ref 98–192)

## 2023-07-15 MED ORDER — IOHEXOL 300 MG/ML  SOLN
100.0000 mL | Freq: Once | INTRAMUSCULAR | Status: AC | PRN
  Administered 2023-07-15: 100 mL via INTRAVENOUS

## 2023-07-15 NOTE — H&P (Signed)
Surgical History & Physical  Patient Name: Deanna Bush  DOB: Apr 13, 1961  Surgery: Cataract extraction with intraocular lens implant phacoemulsification; Left Eye Surgeon: Fabio Pierce MD Surgery Date: 07/21/2023 Pre-Op Date: 06/19/2023  HPI: A 32 Yr. old female patient present for 13 day post op OD. Patient is doing well, happy with vision. Using Combo gtt BID OD. Having difficulties recognizing peoples from a distance, watching TV and reading captions, glare problems at night and during the day OS. This is negatively affecting the patient's quality of life and the patient is unable to function adequately in life with the current level of vision. Patient would like to proceed with cataract sx OS. HPI was performed by Fabio Pierce .  Medical History: Cataracts  Cancer High Blood Pressure LDL Thyroid Problems  Review of Systems Cardiovascular High Blood Pressure Endocrine Hyperthyroidism All recorded systems are negative except as noted above.  Social Former smoker   Medication Prednisolone-moxiflox-bromfen,  benzonatate ,  amoxicillin-pot clavulanate ,  prednisone , Tylenol, Atorvastatin, Epi pen, Afinitor, Iron, Lenvima, Synthroid, Multivitamin  Sx/Procedures Phaco c IOL OD  Drug Allergies  Sulfa (Sulfonamide Antibiotics)   History & Physical: Heent: cataract NECK: supple without bruits LUNGS: lungs clear to auscultation CV: regular rate and rhythm Abdomen: soft and non-tender  Impression & Plan: Assessment: 1.  CATARACT EXTRACTION STATUS; Right Eye (Z98.41) 2.  COMBINED FORMS AGE RELATED CATARACT; Left Eye (H25.812) 3.  NUCLEAR SCLEROSIS AGE RELATED; Left Eye (H25.12)  Plan: 1.  13 days after cataract surgery. Doing well with improved vision and normal eye pressure. Call with any problems or concerns. Continue Pred-Moxi-Brom 2x/day for 2 more weeks.  2.  Cataract accounts for the patient's decreased vision. This visual impairment is not correctable with a  tolerable change in glasses or contact lenses. Cataract surgery with an implantation of a new lens should significantly improve the visual and functional status of the patient. Discussed all risks, benefits, alternatives, and potential complications. Discussed the procedures and recovery. Patient desires to have surgery. A-scan ordered and performed today for intra-ocular lens calculations. The surgery will be performed in order to improve vision for driving, reading, and for eye examinations. Recommend phacoemulsification with intra-ocular lens. Recommend Dextenza for post-operative pain and inflammation. Left Eye. Surgery required to correct imbalance of vision. Dilates well - shugarcaine by protocol.  3. See above

## 2023-07-17 ENCOUNTER — Other Ambulatory Visit: Payer: Self-pay

## 2023-07-17 ENCOUNTER — Encounter (HOSPITAL_COMMUNITY): Payer: Self-pay

## 2023-07-17 ENCOUNTER — Encounter (HOSPITAL_COMMUNITY)
Admission: RE | Admit: 2023-07-17 | Discharge: 2023-07-17 | Disposition: A | Discharge status: Home / Self Care | Payer: 59 | Source: Ambulatory Visit | Attending: Ophthalmology | Admitting: Ophthalmology

## 2023-07-21 ENCOUNTER — Encounter (HOSPITAL_COMMUNITY): Payer: Self-pay | Admitting: Ophthalmology

## 2023-07-21 ENCOUNTER — Ambulatory Visit (HOSPITAL_COMMUNITY): Payer: 59 | Admitting: Anesthesiology

## 2023-07-21 ENCOUNTER — Other Ambulatory Visit: Payer: Self-pay

## 2023-07-21 ENCOUNTER — Ambulatory Visit (HOSPITAL_COMMUNITY)
Admission: RE | Admit: 2023-07-21 | Discharge: 2023-07-21 | Disposition: A | Discharge status: Home / Self Care | Payer: 59 | Attending: Ophthalmology | Admitting: Ophthalmology

## 2023-07-21 ENCOUNTER — Encounter (HOSPITAL_COMMUNITY)
Admission: RE | Disposition: A | Discharge status: Home / Self Care | Payer: Self-pay | Source: Home / Self Care | Attending: Ophthalmology

## 2023-07-21 DIAGNOSIS — E0590 Thyrotoxicosis, unspecified without thyrotoxic crisis or storm: Secondary | ICD-10-CM | POA: Diagnosis not present

## 2023-07-21 DIAGNOSIS — E039 Hypothyroidism, unspecified: Secondary | ICD-10-CM | POA: Diagnosis not present

## 2023-07-21 DIAGNOSIS — H25812 Combined forms of age-related cataract, left eye: Secondary | ICD-10-CM | POA: Diagnosis present

## 2023-07-21 DIAGNOSIS — I10 Essential (primary) hypertension: Secondary | ICD-10-CM | POA: Insufficient documentation

## 2023-07-21 DIAGNOSIS — R7303 Prediabetes: Secondary | ICD-10-CM | POA: Insufficient documentation

## 2023-07-21 DIAGNOSIS — Z87891 Personal history of nicotine dependence: Secondary | ICD-10-CM | POA: Insufficient documentation

## 2023-07-21 DIAGNOSIS — Z853 Personal history of malignant neoplasm of breast: Secondary | ICD-10-CM | POA: Insufficient documentation

## 2023-07-21 HISTORY — PX: CATARACT EXTRACTION W/PHACO: SHX586

## 2023-07-21 SURGERY — PHACOEMULSIFICATION, CATARACT, WITH IOL INSERTION
Anesthesia: General | Site: Eye | Laterality: Left

## 2023-07-21 MED ORDER — EPINEPHRINE PF 1 MG/ML IJ SOLN
INTRAOCULAR | Status: DC | PRN
  Administered 2023-07-21: 500 mL

## 2023-07-21 MED ORDER — TROPICAMIDE 1 % OP SOLN
1.0000 [drp] | OPHTHALMIC | Status: AC | PRN
  Administered 2023-07-21 (×3): 1 [drp] via OPHTHALMIC

## 2023-07-21 MED ORDER — SODIUM HYALURONATE 10 MG/ML IO SOLUTION
PREFILLED_SYRINGE | INTRAOCULAR | Status: DC | PRN
  Administered 2023-07-21: .85 mL via INTRAOCULAR

## 2023-07-21 MED ORDER — POVIDONE-IODINE 5 % OP SOLN
OPHTHALMIC | Status: DC | PRN
  Administered 2023-07-21: 1 via OPHTHALMIC

## 2023-07-21 MED ORDER — SODIUM HYALURONATE 23MG/ML IO SOSY
PREFILLED_SYRINGE | INTRAOCULAR | Status: DC | PRN
  Administered 2023-07-21: .6 mL via INTRAOCULAR

## 2023-07-21 MED ORDER — SODIUM CHLORIDE 0.9% FLUSH
INTRAVENOUS | Status: DC | PRN
  Administered 2023-07-21: 5 mL via INTRAVENOUS

## 2023-07-21 MED ORDER — PHENYLEPHRINE HCL 2.5 % OP SOLN
1.0000 [drp] | OPHTHALMIC | Status: AC | PRN
  Administered 2023-07-21 (×3): 1 [drp] via OPHTHALMIC

## 2023-07-21 MED ORDER — LIDOCAINE HCL 3.5 % OP GEL
1.0000 | Freq: Once | OPHTHALMIC | Status: AC
  Administered 2023-07-21: 1 via OPHTHALMIC

## 2023-07-21 MED ORDER — MOXIFLOXACIN HCL 5 MG/ML IO SOLN
INTRAOCULAR | Status: DC | PRN
  Administered 2023-07-21: .2 mL via INTRACAMERAL

## 2023-07-21 MED ORDER — EPINEPHRINE PF 1 MG/ML IJ SOLN
INTRAMUSCULAR | Status: AC
  Filled 2023-07-21: qty 1

## 2023-07-21 MED ORDER — TETRACAINE HCL 0.5 % OP SOLN
1.0000 [drp] | OPHTHALMIC | Status: AC | PRN
  Administered 2023-07-21 (×3): 1 [drp] via OPHTHALMIC

## 2023-07-21 MED ORDER — MIDAZOLAM HCL 2 MG/2ML IJ SOLN
INTRAMUSCULAR | Status: AC
  Filled 2023-07-21: qty 2

## 2023-07-21 MED ORDER — STERILE WATER FOR IRRIGATION IR SOLN
Status: DC | PRN
  Administered 2023-07-21: 250 mL

## 2023-07-21 MED ORDER — LIDOCAINE HCL (PF) 1 % IJ SOLN
INTRAOCULAR | Status: DC | PRN
  Administered 2023-07-21: 1 mL via OPHTHALMIC

## 2023-07-21 MED ORDER — MIDAZOLAM HCL 2 MG/2ML IJ SOLN
INTRAMUSCULAR | Status: DC | PRN
  Administered 2023-07-21: 1 mg via INTRAVENOUS

## 2023-07-21 MED ORDER — BSS IO SOLN
INTRAOCULAR | Status: DC | PRN
  Administered 2023-07-21: 15 mL via INTRAOCULAR

## 2023-07-21 SURGICAL SUPPLY — 14 items
CATARACT SUITE SIGHTPATH (MISCELLANEOUS) ×1
CLOTH BEACON ORANGE TIMEOUT ST (SAFETY) ×2 IMPLANT
EYE SHIELD UNIVERSAL CLEAR (GAUZE/BANDAGES/DRESSINGS) IMPLANT
FEE CATARACT SUITE SIGHTPATH (MISCELLANEOUS) ×2 IMPLANT
GLOVE BIOGEL PI IND STRL 7.0 (GLOVE) ×4 IMPLANT
LENS IOL TECNIS EYHANCE 20.5 (Intraocular Lens) IMPLANT
NDL HYPO 18GX1.5 BLUNT FILL (NEEDLE) ×2 IMPLANT
NEEDLE HYPO 18GX1.5 BLUNT FILL (NEEDLE) ×1
PAD ARMBOARD 7.5X6 YLW CONV (MISCELLANEOUS) ×1 IMPLANT
POSITIONER HEAD 8X9X4 ADT (SOFTGOODS) ×2 IMPLANT
RING MALYGIN 7.0 (MISCELLANEOUS) IMPLANT
SYR TB 1ML LL NO SAFETY (SYRINGE) ×1 IMPLANT
TAPE SURG TRANSPORE 1 IN (GAUZE/BANDAGES/DRESSINGS) IMPLANT
WATER STERILE IRR 250ML POUR (IV SOLUTION) ×2 IMPLANT

## 2023-07-21 NOTE — Interval H&P Note (Signed)
History and Physical Interval Note:  07/21/2023 1:13 PM  Deanna Bush  has presented today for surgery, with the diagnosis of combined forms age related cataract, left eye.  The various methods of treatment have been discussed with the patient and family. After consideration of risks, benefits and other options for treatment, the patient has consented to  Procedure(s): CATARACT EXTRACTION PHACO AND INTRAOCULAR LENS PLACEMENT (IOC) (Left) as a surgical intervention.  [name] history has been reviewed, patient examined, no change in status, stable for surgery.  I have reviewed the patient's chart and labs.  Questions were answered to the patient's satisfaction.     Fabio Pierce

## 2023-07-21 NOTE — Discharge Instructions (Addendum)
Please discharge patient when stable, will follow up today with Dr. Carnella Fryman at the [facility] office immediately following discharge.  Leave shield in place until visit.  All paperwork with discharge instructions will be given at the office.  [facility] Address:  [address]  [place], [place] [address]  

## 2023-07-21 NOTE — Anesthesia Preprocedure Evaluation (Signed)
Anesthesia Evaluation  Patient identified by MRN, date of birth, ID band Patient awake    Reviewed: Allergy & Precautions, H&P , NPO status , Patient's Chart, lab work & pertinent test results, reviewed documented beta blocker date and time   History of Anesthesia Complications (+) history of anesthetic complications  Airway Mallampati: II  TM Distance: >3 FB Neck ROM: full    Dental no notable dental hx.    Pulmonary neg pulmonary ROS, former smoker   Pulmonary exam normal breath sounds clear to auscultation       Cardiovascular Exercise Tolerance: Good hypertension, negative cardio ROS  Rhythm:regular Rate:Normal     Neuro/Psych  PSYCHIATRIC DISORDERS Anxiety Depression    negative neurological ROS  negative psych ROS   GI/Hepatic negative GI ROS, Neg liver ROS,,,  Endo/Other  negative endocrine ROSdiabetesHypothyroidism    Renal/GU Renal diseasenegative Renal ROS  negative genitourinary   Musculoskeletal   Abdominal   Peds  Hematology negative hematology ROS (+) Blood dyscrasia, anemia   Anesthesia Other Findings   Reproductive/Obstetrics negative OB ROS                             Anesthesia Physical Anesthesia Plan  ASA: 3  Anesthesia Plan: General   Post-op Pain Management:    Induction:   PONV Risk Score and Plan:   Airway Management Planned:   Additional Equipment:   Intra-op Plan:   Post-operative Plan:   Informed Consent: I have reviewed the patients History and Physical, chart, labs and discussed the procedure including the risks, benefits and alternatives for the proposed anesthesia with the patient or authorized representative who has indicated his/her understanding and acceptance.     Dental Advisory Given  Plan Discussed with: CRNA  Anesthesia Plan Comments:        Anesthesia Quick Evaluation

## 2023-07-21 NOTE — Op Note (Signed)
Date of procedure: 07/21/23  Pre-operative diagnosis: Visually significant age-related combined cataract, Left Eye (H25.812)  Post-operative diagnosis: Visually significant age-related combined cataract, Left Eye (H25.812)  Procedure: Removal of cataract via phacoemulsification and insertion of intra-ocular lens Laural Benes and Johnson DIB00 +20.5D into the capsular bag of the Left Eye  Attending surgeon: Rudy Jew. Dajane Valli, MD, MA  Anesthesia: MAC, Topical Akten  Complications: None  Estimated Blood Loss: <57mL (minimal)  Specimens: None  Implants: As above  Indications:  Visually significant age-related cataract, Left Eye  Procedure:  The patient was seen and identified in the pre-operative area. The operative eye was identified and dilated.  The operative eye was marked.  Topical anesthesia was administered to the operative eye.     The patient was then to the operative suite and placed in the supine position.  A timeout was performed confirming the patient, procedure to be performed, and all other relevant information.   [name] face was prepped and draped in the usual fashion for intra-ocular surgery.  A lid speculum was placed into the operative eye and the surgical microscope moved into place and focused.  An inferotemporal paracentesis was created using a 20 gauge paracentesis blade.  Shugarcaine was injected into the anterior chamber.  Viscoelastic was injected into the anterior chamber.  A temporal clear-corneal main wound incision was created using a 2.30mm microkeratome.  A continuous curvilinear capsulorrhexis was initiated using an irrigating cystitome and completed using capsulorrhexis forceps.  Hydrodissection and hydrodeliniation were performed.  Viscoelastic was injected into the anterior chamber.  A phacoemulsification handpiece and a chopper as a second instrument were used to remove the nucleus and epinucleus. The irrigation/aspiration handpiece was used to remove any  remaining cortical material.   The capsular bag was reinflated with viscoelastic, checked, and found to be intact.  The intraocular lens was inserted into the capsular bag.  The irrigation/aspiration handpiece was used to remove any remaining viscoelastic.  The clear corneal wound and paracentesis wounds were then hydrated and checked with Weck-Cels to be watertight. 0.53mL of Moxfloxacin was injected into the anterior chamber. The lid-speculum was removed.  The drape was removed.  [name] face was cleaned with a wet and dry 4x4.    A clear shield was taped over the eye. The patient was taken to the post-operative care unit in good condition, having tolerated the procedure well.  Post-Op Instructions: The patient will follow up at [facility] for a same day post-operative evaluation and will receive all other orders and instructions.

## 2023-07-21 NOTE — Progress Notes (Signed)
[facility] [address] [place] [address]    Clinic Day:  07/22/23   Referring physician: Kerri Perches, MD  Patient Care Team: Kerri Perches, MD as PCP - General Mallipeddi, Orion Modest, MD as PCP - Cardiology (Cardiology) Doreatha Massed, MD as Medical Oncologist (Medical Oncology)   ASSESSMENT & PLAN:   Assessment: 1. Metastatic clear-cell renal cell carcinoma: - Right radical nephrectomy on 02/06/2021. - Pathology shows clear-cell RCC, grade 4, 12.5 cm, necrosis and rhabdoid features are identified.  Tumor extends into the and invades the wall of the vena cava (pT3c).  Vascular, ureteral and all margins of resection are negative for tumor. - CT scan abdomen with and without contrast on 05/29/2021 showed several nodules along the peritoneal surface of the right nephrectomy bed warranting close attention. - CTAP with and without contrast on 09/18/2021 showed progressive nodularity within the right nephrectomy bed, with enlarged nodules adjacent to the right hepatic lobe extending inferiorly along the right retroperitoneum with multiple enlarged enhancing nodules.  Direct metastatic invasion into the right hepatic lobe.  Small nodule at the right lung base favored to be benign. - IMDC: Intermediate risk group with 2 features including diagnosis less than 1 year and hemoglobin lower than normal. - Bone scan on 10/05/2021 with focal activity in the mental vertex of the mandible which could be inflammatory/traumatic/metastatic. - CT chest on 10/03/2021 showed several lung nodules scattered bilaterally some of which could be metastatic and many others could be due to mucoid impaction. - NGS testing did not show any targetable mutations.  TMB was low.  CCND3 amplified.  PBRM1 VUS present. - Right nephrectomy bed biopsy (10/22/2021): Clear-cell renal cell carcinoma with necrosis - 4 cycles of ipilimumab and nivolumab from 11/19/2021 through 01/24/2022 - CT CAP  on 02/14/2022: Numerous new and enlarged lung nodules.  Significant interval increase in the mass in the right renal bed.  New hypodense liver lesions. - Lenvatinib (12 mg) and everolimus 5 mg daily started on 03/13/2022, discontinued on 07/22/2023 due to progression. - Belzutifan on 20 mg daily started on   2. Social/family history: - She lives at home with her husband and also takes care of her mother. - She works as a Child psychotherapist at [employer].  She quit smoking in 2004 and smoked half pack per day for 10 years. - Half sister (same mother) had kidney cancer at age 39.  Maternal grandfather had kidney cancer.  Sister died of lung cancer.  2 half sisters (same father) had breast cancers.  3.  Left breast stage I tubular adenocarcinoma: - She underwent left mastectomy on 06/11/2002.  0/6 lymph nodes positive.  ER 90%, PR 92%, Ki-67 6%, HER2 negative.  As per chart, declined antiestrogen therapy.    Plan: 1. Metastatic clear-cell RCC, intermediate risk by IMDC: - She is tolerating the lenvatinib and everolimus reasonably well except watery diarrhea, ranging from 2-5 stools per day. - Denies any abdominal pain or new symptoms. - Reviewed CT CAP from 07/15/2023: Progressive disease. - Recommend discontinuation of lenvatinib and everolimus. - We talked about next line of therapy with belzutifan 120 mg daily.  We discussed side effects including anemia and hypoxia among others. - She will start taking as soon as she receives the shipment.  I will follow-up with her in 3 weeks with repeat labs.  2.  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 - Latest CBC shows hemoglobin 11.8.  Ferritin  is 180.  3.  Hypothyroidism: - She is taking Synthroid 125 mcg daily.  TSH is 36.  She will see her endocrinologist next week.   4.  Hypertension: - Continue olmesartan/HCTZ 40/25 mg daily.  Blood pressure is 150/80.  5.  Diarrhea: - She has watery diarrhea  ranging from 2-5 stools per day while on lenvatinib and everolimus.  Uses Imodium as needed.  6.  Weight loss: - Continue 2 ensures per day.  Weight is stable.    No orders of the defined types were placed in this encounter.     Alben Deeds Teague,acting as a Neurosurgeon for Doreatha Massed, MD.,have documented all relevant documentation on the behalf of Doreatha Massed, MD,as directed by  Doreatha Massed, MD while in the presence of Doreatha Massed, MD.  I, Doreatha Massed MD, have reviewed the above documentation for accuracy and completeness, and I agree with the above.     Doreatha Massed, MD   10/15/20245:23 PM  CHIEF COMPLAINT:   Diagnosis: metastatic clear-cell renal cell carcinoma    Cancer Staging  Malignant neoplasm of female breast [facility]) Staging form: Breast, AJCC 6th Edition - Clinical: Stage I (T1b, N0, M0) - Signed by Ellouise Newer, PA on 08/16/2011  Renal cell cancer [facility]) Staging form: Kidney, AJCC 8th Edition - Clinical stage from 09/26/2021: Stage IV (cT3c, cNX, cM1) - Unsigned    Prior Therapy: Nivolumab plus ipilimumab for 4 cycles with progression, lenvatinib and everolimus  Current Therapy: Belzutifan   HISTORY OF PRESENT ILLNESS:   Oncology History  Renal cell cancer (HCC)  06/20/2021 Initial Diagnosis   Renal cell carcinoma of right kidney (HCC)   11/19/2021 - 01/24/2022 Chemotherapy   Patient is on Treatment Plan : RENAL CELL CARCINOMA Nivolumab + Ipilimumab q21d / Nivolumab q28d      Genetic Testing   Negative genetic testing. No pathogenic variants identified on the [facility] Multi-Cancer Panel + RNA. VUS in PMS2 called c.1732C>A and in RECQL4 called c.2967G>A  Identified. The report date is 11/01/2021.   The Multi-Cancer Panel + RNA offered by [facility] includes sequencing and/or deletion duplication testing of the following 84 genes: AIP, ALK, APC, ATM, AXIN2,BAP1,  BARD1, BLM, BMPR1A, BRCA1, BRCA2, BRIP1, CASR, CDC73,  CDH1, CDK4, CDKN1B, CDKN1C, CDKN2A (p14ARF), CDKN2A (p16INK4a), CEBPA, CHEK2, CTNNA1, DICER1, DIS3L2, EGFR (c.2369C>T, p.Thr790Met variant only), EPCAM (Deletion/duplication testing only), FH, FLCN, GATA2, GPC3, GREM1 (Promoter region deletion/duplication testing only), HOXB13 (c.251G>A, p.Gly84Glu), HRAS, KIT, MAX, MEN1, MET, MITF (c.952G>A, p.Glu318Lys variant only), MLH1, MSH2, MSH3, MSH6, MUTYH, NBN, NF1, NF2, NTHL1, PALB2, PDGFRA, PHOX2B, PMS2, POLD1, POLE, POT1, PRKAR1A, PTCH1, PTEN, RAD50, RAD51C, RAD51D, RB1, RECQL4, RET, RUNX1, SDHAF2, SDHA (sequence changes only), SDHB, SDHC, SDHD, SMAD4, SMARCA4, SMARCB1, SMARCE1, STK11, SUFU, TERC, TERT, TMEM127, TP53, TSC1, TSC2, VHL, WRN and WT1.       INTERVAL HISTORY:   Nikko is a 62 y.o. female presenting to clinic today for follow up of metastatic clear-cell renal cell carcinoma. She was last seen by me on 05/06/23.  Since her last visit, she had a CT C/A/P on 07/15/23 that found: progressive extensive soft tissue nodularity in the right retroperitoneum, hepatorenal fossa, right pericolic gutter and porta hepatis extending into the musculature/soft tissues overlying the right flank; increased burden of tumor nodularity or thrombus involving the IVC at the level of the right renal vein with increased burden of IVC thrombus versus mixing artifact of the IVC and extending down the right iliac veins; new pathologically enlarged prevascular lymph node measures 10  mm; bilobar hypodense hepatic lesions again seen some of which have increased in size and others are stable; no new suspicious hepatic lesion identified; and unchanged 8 mm irregular nodule in the anterior left upper lobe.  Of note, she underwent cataract extraction and IOC lens placement of the right eye on 06/06/23 and of the left eye yesterday.   Today, she states that she is doing well overall. Her appetite level is at 75%. Her energy level is at 80%.  She reports a normal appetite, though she  does not eat as much as she used to. She drinks 2 Ensures a day. She denies any fatigue, tiredness, or symptoms of disease progression. She notes since being on Lenvatinib and everolimus she has had issues with her thyroid. Her blood pressure is controlled with medication.   She c/o diarrhea with water stools 2-5x a day. She has not taken Imodium recently, though she has taken it in the past which effectively treated diarrhea.   PAST MEDICAL HISTORY:   Past Medical History: Past Medical History:  Diagnosis Date   Adenocarcinoma of breast (HCC)    left    Cellulitis of leg, left    Complication of anesthesia    Hard to wake up   Depression    Diabetes mellitus without complication (HCC)    Pt denies   Family history of breast cancer 08/16/2011   Family history of breast cancer    Family history of colon cancer    Family history of kidney cancer    Family history of prostate cancer    Hyperlipidemia    Hypertension    Hypothyroidism    MRSA (methicillin resistant staph aureus) culture positive 08/19/2011   Pre-diabetes     Surgical History: Past Surgical History:  Procedure Laterality Date   BREAST SURGERY Left    mastectomy   CATARACT EXTRACTION W/PHACO Right 06/06/2023   Procedure: CATARACT EXTRACTION PHACO AND INTRAOCULAR LENS PLACEMENT (IOC);  Surgeon: Fabio Pierce, MD;  Location: [facility];  Service: Ophthalmology;  Laterality: Right;  CDE 5.61   CESAREAN SECTION     x2   COLONOSCOPY N/A 03/03/2020   Procedure: COLONOSCOPY;  Surgeon: Corbin Ade, MD;  Location: [facility];  Service: Endoscopy;  Laterality: N/A;  12:00   IR IMAGING GUIDED PORT INSERTION  11/07/2021   left mastectomy     NEPHRECTOMY Right 02/06/2021   Procedure: NEPHRECTOMY- open radical;  Surgeon: Malen Gauze, MD;  Location: [facility];  Service: Urology;  Laterality: Right;   POLYPECTOMY  03/03/2020   Procedure: POLYPECTOMY;  Surgeon: Corbin Ade, MD;  Location: [facility];  Service:  Endoscopy;;    Social History: Social History   Socioeconomic History   Marital status: Married    Spouse name: Not on file   Number of children: 2   Years of education: Not on file   Highest education level: GED or equivalent  Occupational History   Occupation: CNA  Tobacco Use   Smoking status: Former    Current packs/day: 0.00    Average packs/day: 0.5 packs/day for 18.0 years (9.0 ttl pk-yrs)    Types: Cigarettes    Start date: 07/01/1984    Quit date: 07/01/2002    Years since quitting: 21.0   Smokeless tobacco: Never  Vaping Use   Vaping status: Never Used  Substance and Sexual Activity   Alcohol use: No   Drug use: No   Sexual activity: Yes    Birth control/protection: None  Other Topics Concern   Not on file  Social History Narrative   Not on file   Social Determinants of Health   Financial Resource Strain: Low Risk  (02/02/2023)   Overall Financial Resource Strain (CARDIA)    Difficulty of Paying Living Expenses: Not hard at all  Food Insecurity: No Food Insecurity (02/02/2023)   Hunger Vital Sign    Worried About Running Out of Food in the Last Year: Never true    Ran Out of Food in the Last Year: Never true  Transportation Needs: No Transportation Needs (02/02/2023)   PRAPARE - Administrator, Civil Service (Medical): No    Lack of Transportation (Non-Medical): No  Physical Activity: Insufficiently Active (02/02/2023)   Exercise Vital Sign    Days of Exercise per Week: 3 days    Minutes of Exercise per Session: 30 min  Stress: Stress Concern Present (02/02/2023)   [employer] of Occupational Health - Occupational Stress Questionnaire    Feeling of Stress : To some extent  Social Connections: Moderately Integrated (02/02/2023)   Social Connection and Isolation Panel [NHANES]    Frequency of Communication with Friends and Family: Three times a week    Frequency of Social Gatherings with Friends and Family: Three times a week    Attends  Religious Services: More than 4 times per year    Active Member of Clubs or Organizations: Yes    Attends Engineer, structural: More than 4 times per year    Marital Status: Separated  Intimate Partner Violence: Not on file    Family History: Family History  Problem Relation Age of Onset   Alcohol abuse Mother    Hypertension Mother    Diabetes Mother    Hyperlipidemia Mother    Colon cancer Maternal Aunt        dx 44s   Prostate cancer Maternal Uncle    Throat cancer Maternal Uncle    Kidney cancer Maternal Grandfather    Lung cancer Half-Sister    Kidney cancer Half-Sister 11   Breast cancer Half-Sister 68   Breast cancer Half-Sister 78    Current Medications:  Current Outpatient Medications:    acetaminophen (TYLENOL) 500 MG tablet, Take 500 mg by mouth every 6 (six) hours as needed for moderate pain or headache., Disp: , Rfl:    atorvastatin (LIPITOR) 20 MG tablet, Take 1 tablet (20 mg total) by mouth daily., Disp: 90 tablet, Rfl: 1   belzutifan (WELIREG) 40 MG tablet, Take 3 tablets (120 mg total) by mouth daily., Disp: 90 tablet, Rfl: 1   benzonatate (TESSALON) 100 MG capsule, Take 1 capsule (100 mg total) by mouth 2 (two) times daily as needed for cough., Disp: 20 capsule, Rfl: 0   EPINEPHrine 0.3 mg/0.3 mL IJ SOAJ injection, Inject 0.3 mg into the muscle as needed for anaphylaxis., Disp: 2 each, Rfl: 0   Iron, Ferrous Sulfate, 325 (65 Fe) MG TABS, Take 325 mg by mouth daily., Disp: 30 tablet, Rfl: 3   levothyroxine (SYNTHROID) 125 MCG tablet, Take 1 tablet (125 mcg total) by mouth daily before breakfast., Disp: 90 tablet, Rfl: 1   Multiple Vitamin (MULTIVITAMIN WITH MINERALS) TABS tablet, Take 1 tablet by mouth daily., Disp: , Rfl:    Olmesartan-amLODIPine-HCTZ 40-5-25 MG TABS, Take one tablet by mouth once daily for blood pressure, Disp: 90 tablet, Rfl: 3   olmesartan-hydrochlorothiazide (BENICAR HCT) 40-25 MG tablet, TAKE 1 TABLET BY MOUTH DAILY, Disp: 30  tablet, Rfl: 1  UNABLE TO FIND, MASTECTOMY BRA AND PROTHESIS DX Z90.12, Disp: 6 each, Rfl: 2   UNABLE TO FIND, 6 mastectomy prosthesis and bras., Disp: 6 each, Rfl: 0   urea (CARMOL) 10 % cream, Apply topically 2 (two) times daily. Apply twice daily to hands, Disp: 71 g, Rfl: 0   mirtazapine (REMERON) 7.5 MG tablet, TAKE ONE TABLET BY MOUTH ONCE DAILY AT BEDTIME (Patient not taking: Reported on 07/22/2023), Disp: 90 tablet, Rfl: 3 No current facility-administered medications for this visit.  Facility-Administered Medications Ordered in Other Visits:    heparin lock flush 100 unit/mL, 500 Units, Intravenous, Once, Doreatha Massed, MD   Allergies: Allergies  Allergen Reactions   Amlodipine Swelling    Leg edema   Sulfonamide Derivatives Itching   Yellow Jacket Venom [Bee Venom] Rash    urticaria    REVIEW OF SYSTEMS:   Review of Systems  Constitutional:  Negative for chills, fatigue and fever.  HENT:   Negative for lump/mass, mouth sores, nosebleeds, sore throat and trouble swallowing.   Eyes:  Negative for eye problems.  Respiratory:  Negative for cough and shortness of breath.   Cardiovascular:  Negative for chest pain, leg swelling and palpitations.  Gastrointestinal:  Positive for diarrhea. Negative for abdominal pain, constipation, nausea and vomiting.  Genitourinary:  Negative for bladder incontinence, difficulty urinating, dysuria, frequency, hematuria and nocturia.   Musculoskeletal:  Negative for arthralgias, back pain, flank pain, myalgias and neck pain.  Skin:  Negative for itching and rash.  Neurological:  Negative for dizziness, headaches and numbness.  Hematological:  Does not bruise/bleed easily.  Psychiatric/Behavioral:  Negative for depression, sleep disturbance and suicidal ideas. The patient is not nervous/anxious.   All other systems reviewed and are negative.    VITALS:   Blood pressure 135/76, pulse 60, temperature 98 F (36.7 C), temperature source  Oral, resp. rate 16, weight 147 lb 4.8 oz (66.8 kg), SpO2 100%.  Wt Readings from Last 3 Encounters:  07/22/23 147 lb 4.8 oz (66.8 kg)  07/21/23 149 lb 14.6 oz (68 kg)  07/17/23 149 lb 14.6 oz (68 kg)    Body mass index is 23.77 kg/m.  Performance status (ECOG): 0 - Asymptomatic  PHYSICAL EXAM:   Physical Exam Vitals and nursing note reviewed. Exam conducted with a chaperone present.  Constitutional:      Appearance: Normal appearance.  Cardiovascular:     Rate and Rhythm: Normal rate and regular rhythm.     Pulses: Normal pulses.     Heart sounds: Normal heart sounds.  Pulmonary:     Effort: Pulmonary effort is normal.     Breath sounds: Normal breath sounds.  Abdominal:     Palpations: Abdomen is soft. There is no hepatomegaly, splenomegaly or mass.     Tenderness: There is no abdominal tenderness.  Musculoskeletal:     Right lower leg: No edema.     Left lower leg: No edema.  Lymphadenopathy:     Cervical: No cervical adenopathy.     Right cervical: No superficial, deep or posterior cervical adenopathy.    Left cervical: No superficial, deep or posterior cervical adenopathy.     Upper Body:     Right upper body: No supraclavicular or axillary adenopathy.     Left upper body: No supraclavicular or axillary adenopathy.  Neurological:     General: No focal deficit present.     Mental Status: She is alert and oriented to person, place, and time.  Psychiatric:  Mood and Affect: Mood normal.        Behavior: Behavior normal.     LABS:      Latest Ref Rng & Units 07/15/2023    8:03 AM 04/29/2023    8:47 AM 02/28/2023   11:05 AM  CBC  WBC 4.0 - 10.5 K/uL 4.7  4.2  4.6   Hemoglobin 12.0 - 15.0 g/dL 09.8  11.9  14.7   Hematocrit 36.0 - 46.0 % 37.1  36.6  35.3   Platelets 150 - 400 K/uL 294  220  182       Latest Ref Rng & Units 07/21/2023    9:05 AM 07/15/2023    8:03 AM 04/29/2023    8:47 AM  CMP  Glucose 70 - 99 mg/dL 92  829  562   BUN 8 - 27 mg/dL 18   18  22    Creatinine 0.57 - 1.00 mg/dL 1.30  8.65  7.84   Sodium 134 - 144 mmol/L 139  137  138   Potassium 3.5 - 5.2 mmol/L 4.3  3.9  3.7   Chloride 96 - 106 mmol/L 99  100  102   CO2 20 - 29 mmol/L 24  26  27    Calcium 8.7 - 10.3 mg/dL 9.7  9.4  9.4   Total Protein 6.0 - 8.5 g/dL 6.5  7.2  7.3   Total Bilirubin 0.0 - 1.2 mg/dL 0.4  0.5  0.5   Alkaline Phos 44 - 121 IU/L 91  69  76   AST 0 - 40 IU/L 26  24  28    ALT 0 - 32 IU/L 14  17  22       No results found for: "CEA1", "CEA" / No results found for: "CEA1", "CEA" No results found for: "PSA1" No results found for: "ONG295" No results found for: "CAN125"  No results found for: "TOTALPROTELP", "ALBUMINELP", "A1GS", "A2GS", "BETS", "BETA2SER", "GAMS", "MSPIKE", "SPEI" Lab Results  Component Value Date   TIBC 308 07/15/2023   TIBC 307 04/29/2023   TIBC 280 02/28/2023   FERRITIN 180 07/15/2023   FERRITIN 152 04/29/2023   FERRITIN 85 02/28/2023   IRONPCTSAT 21 07/15/2023   IRONPCTSAT 20 04/29/2023   IRONPCTSAT 16 02/28/2023   Lab Results  Component Value Date   LDH 178 07/15/2023   LDH 178 02/21/2022   LDH 152 09/27/2021     STUDIES:   CT CHEST ABDOMEN PELVIS W CONTRAST  Result Date: 07/15/2023 CLINICAL DATA:  Follow-up metastatic renal cell carcinoma * Tracking Code: BO * EXAM: CT CHEST, ABDOMEN, AND PELVIS WITH CONTRAST TECHNIQUE: Multidetector CT imaging of the chest, abdomen and pelvis was performed following the standard protocol during bolus administration of intravenous contrast. RADIATION DOSE REDUCTION: This exam was performed according to the departmental dose-optimization program which includes automated exposure control, adjustment of the mA and/or kV according to patient size and/or use of iterative reconstruction technique. CONTRAST:  OMNIPAQUE IOHEXOL 300 MG/ML  SOLN COMPARISON:  Multiple priors including most recent CT April 29, 2023 FINDINGS: CT CHEST FINDINGS Cardiovascular: Right chest Port-A-Cath with  tip at the superior cavoatrial junction. Aortic atherosclerosis. No central pulmonary embolus on this nondedicated study. Normal size heart. No significant pericardial effusion/thickening. Mediastinum/Nodes: New pathologically enlarged prevascular lymph node measures 10 mm in short axis on image 24/3 previously 5 mm. Prior left axillary lymph node dissection. Mild symmetric esophageal wall thickening. No suspicious thyroid lesion. Lungs/Pleura: Increased small right pleural effusion and adjacent airspace opacities. Scattered atelectasis/scarring.  Irregular nodule in the anterior left upper lobe measures 8 x 6 mm on image 55/5, unchanged. No new suspicious pulmonary nodules or masses. Musculoskeletal: Prior left mastectomy. CT ABDOMEN PELVIS FINDINGS Hepatobiliary: Bilobar hypodense hepatic lesions again seen some of which have increased in size and others are stable. No new suspicious hepatic lesion identified. For reference: -central segment IV lesion measures 2.6 x 2.1 cm on image 54/3 previously 2.5 x 2.2 cm Segment VII hepatic lesion measures 13 mm on image 44/3 previously 11 mm. Gallbladder is unremarkable.  No biliary ductal dilation. Pancreas: No pancreatic ductal dilation or evidence of acute inflammation. Spleen: No splenomegaly. Adrenals/Urinary Tract: Right kidney surgically absent. Progressive extensive soft tissue nodularity in the right retroperitoneum, hepatorenal fossa, right pericolic gutter and porta hepatis extending into the musculature/soft tissues overlying the right flank. Indexed lesions are as follows: -Indexed lesion in the hepatorenal fossa measures 5.4 x 3.4 cm on image 55/3 previously 5.0 x 3.6 cm. -Indexed lesion in the right flank which involves the twelfth rib measures 4.8 x 3.0 cm on image 61/3 previously 4.0 x 2.7 cm. -Indexed nodule in the inferior aspect of the nephrectomy bed measures 3.3 x 3.2 cm on image 73/3 previously 3.2 x 3.2 cm. -Indexed lesion in the inferior aspect of  the pericolic gutter measures 4.5 x 2.3 cm on image 71/3 previously 4.0 x 2.6 cm when remeasured for consistency. -small soft tissue lesions in the right erector spinae musculature are unchanged measuring 1 cm and 9 mm on image 59/3 and 58/3 respectively. No suspicious left renal lesion.  Left adrenal gland appears normal. Stomach/Bowel: Stomach is unremarkable for degree of distension. No pathologic dilation of small or large bowel. No evidence of acute bowel inflammation. Vascular/Lymphatic: Increased burden of tumor nodularity or thrombus involving the IVC at the level of the right renal vein with increased burden of IVC thrombus versus mixing artifact of the IVC and extending down the right iliac veins. The portal, splenic and superior mesenteric veins are patent. Aortic atherosclerosis. No pathologically enlarged abdominal or pelvic lymph nodes confidently identified. Reproductive: No mass or other abnormality. Other: No significant abdominopelvic free fluid. Musculoskeletal: Multilevel degenerative change of the spine. No new aggressive lytic or blastic lesion of bone. IMPRESSION: Overall findings compatible with increased burden of disease. For instance: 1. Progressive extensive soft tissue nodularity in the right retroperitoneum, hepatorenal fossa, right pericolic gutter and porta hepatis extending into the musculature/soft tissues overlying the right flank. 2. Increased burden of tumor nodularity or thrombus involving the IVC at the level of the right renal vein with increased burden of IVC thrombus versus mixing artifact of the IVC and extending down the right iliac veins. 3. New pathologically enlarged prevascular lymph node measures 10 mm. 4. Bilobar hypodense hepatic lesions again seen some of which have increased in size and others are stable. No new suspicious hepatic lesion identified. 5. Unchanged 8 mm irregular nodule in the anterior left upper lobe. 6.  Aortic Atherosclerosis (ICD10-I70.0).  Electronically Signed   By: Maudry Mayhew M.D.   On: 07/15/2023 11:00

## 2023-07-21 NOTE — Transfer of Care (Signed)
Immediate Anesthesia Transfer of Care Note  Patient: Deanna Bush  Procedure(s) Performed: CATARACT EXTRACTION PHACO AND INTRAOCULAR LENS PLACEMENT (IOC) (Left: Eye)  Patient Location: Short Stay  Anesthesia Type:MAC  Level of Consciousness: awake, alert , oriented, and patient cooperative  Airway & Oxygen Therapy: Patient Spontanous Breathing  Post-op Assessment: Report given to RN, Post -op Vital signs reviewed and stable, and Patient moving all extremities X 4  Post vital signs: Reviewed and stable  Last Vitals:  Vitals Value Taken Time  BP 129/73 07/21/23 1342  Temp 36.8 C 07/21/23 1342  Pulse 71 07/21/23 1342  Resp 22 07/21/23 1342  SpO2 100 % 07/21/23 1342    Last Pain:  Vitals:   07/21/23 1342  TempSrc: Oral  PainSc: 0-No pain         Complications: No notable events documented.

## 2023-07-22 ENCOUNTER — Other Ambulatory Visit (HOSPITAL_COMMUNITY): Payer: Self-pay

## 2023-07-22 ENCOUNTER — Inpatient Hospital Stay (HOSPITAL_BASED_OUTPATIENT_CLINIC_OR_DEPARTMENT_OTHER): Payer: 59 | Admitting: Hematology

## 2023-07-22 ENCOUNTER — Telehealth: Payer: Self-pay

## 2023-07-22 VITALS — BP 135/76 | HR 60 | Temp 98.0°F | Resp 16 | Wt 147.3 lb

## 2023-07-22 DIAGNOSIS — C641 Malignant neoplasm of right kidney, except renal pelvis: Secondary | ICD-10-CM | POA: Diagnosis not present

## 2023-07-22 LAB — LIPID PANEL
Chol/HDL Ratio: 3.1 {ratio} (ref 0.0–4.4)
Cholesterol, Total: 233 mg/dL — ABNORMAL HIGH (ref 100–199)
HDL: 74 mg/dL (ref >39)
LDL Chol Calc (NIH): 145 mg/dL — ABNORMAL HIGH (ref 0–99)
Triglycerides: 84 mg/dL (ref 0–149)
VLDL Cholesterol Cal: 14 mg/dL (ref 5–40)

## 2023-07-22 LAB — COMPREHENSIVE METABOLIC PANEL
ALT: 14 [IU]/L (ref 0–32)
AST: 26 [IU]/L (ref 0–40)
Albumin: 3.8 g/dL — ABNORMAL LOW (ref 3.9–4.9)
Alkaline Phosphatase: 91 [IU]/L (ref 44–121)
BUN/Creatinine Ratio: 16 (ref 12–28)
BUN: 18 mg/dL (ref 8–27)
Bilirubin Total: 0.4 mg/dL (ref 0.0–1.2)
CO2: 24 mmol/L (ref 20–29)
Calcium: 9.7 mg/dL (ref 8.7–10.3)
Chloride: 99 mmol/L (ref 96–106)
Creatinine, Ser: 1.1 mg/dL — ABNORMAL HIGH (ref 0.57–1.00)
Globulin, Total: 2.7 g/dL (ref 1.5–4.5)
Glucose: 92 mg/dL (ref 70–99)
Potassium: 4.3 mmol/L (ref 3.5–5.2)
Sodium: 139 mmol/L (ref 134–144)
Total Protein: 6.5 g/dL (ref 6.0–8.5)
eGFR: 56 mL/min/{1.73_m2} — ABNORMAL LOW (ref >59)

## 2023-07-22 LAB — T4, FREE: Free T4: 1.13 ng/dL (ref 0.82–1.77)

## 2023-07-22 LAB — TSH: TSH: 36.1 u[IU]/mL — ABNORMAL HIGH (ref 0.450–4.500)

## 2023-07-22 MED ORDER — BELZUTIFAN 40 MG PO TABS: 120.0000 mg | ORAL_TABLET | Freq: Every day | ORAL | 1 refills | Status: DC

## 2023-07-22 NOTE — Patient Instructions (Addendum)
[facility]  Discharge Instructions  You were seen and examined today by Dr. Ellin Saba.  Dr. Ellin Saba discussed your most recent lab work and CT scan which revealed that the cancer has progressed.   Dr. Ellin Saba has recommended changing your cancer treatment from the two pills that you are currently on to a new medication known as Belzutifan.  Follow-up as scheduled.  Thank you for choosing [facility] to provide your oncology and hematology care.   To afford each patient quality time with our provider, please arrive at least 15 minutes before your scheduled appointment time. You may need to reschedule your appointment if you arrive late (10 or more minutes). Arriving late affects you and other patients whose appointments are after yours.  Also, if you miss three or more appointments without notifying the office, you may be dismissed from the clinic at the provider's discretion.    Again, thank you for choosing [facility].  Our hope is that these requests will decrease the amount of time that you wait before being seen by our physicians.   If you have a lab appointment with the Cancer Center - please note that after April 8th, all labs will be drawn in the cancer center.  You do not have to check in or register with the main entrance as you have in the past but will complete your check-in at the cancer center.            _____________________________________________________________  Should you have questions after your visit to [facility], please contact our office at [phone] and follow the prompts.  Our office hours are 8:00 a.m. to 4:30 p.m. Monday - Thursday and 8:00 a.m. to 2:30 p.m. Friday.  Please note that voicemails left after 4:00 p.m. may not be returned until the following business day.  We are closed weekends and all major holidays.  You do have access to a nurse 24-7, just call the main  number to the clinic [phone] and do not press any options, hold on the line and a nurse will answer the phone.    For prescription refill requests, have your pharmacy contact our office and allow 72 hours.    Masks are no longer required in the cancer centers. If you would like for your care team to wear a mask while they are taking care of you, please let them know. You may have one support person who is at least 64 years old accompany you for your appointments.

## 2023-07-22 NOTE — Telephone Encounter (Signed)
Oral Oncology Pharmacist Encounter  Received new prescription for Welireg (belzutifan) for the treatment of metastatic clear cell renal cell carcinoma, planned duration until disease progression or unacceptable toxicity.  Labs from 07/15/23 and 07/21/23 assessed, no interventions needed. Prescription dose and frequency assessed for appropriateness.   Current medication list in Epic reviewed, no signficant/ relevant DDIs with Welireg identified.  Evaluated chart and no patient barriers to medication adherence noted.   Patient agreement for treatment documented in MD note on 07/22/2023.  Prescription has been e-scribed to the [facility] for benefits analysis and approval.  Oral Oncology Clinic will continue to follow for insurance authorization, copayment issues, initial counseling and start date.  Bethel Born, PharmD Hematology/Oncology Clinical Pharmacist [facility] Oral Chemotherapy Navigation Clinic [phone] 07/22/2023 9:22 AM

## 2023-07-23 ENCOUNTER — Telehealth: Payer: Self-pay

## 2023-07-23 ENCOUNTER — Ambulatory Visit (INDEPENDENT_AMBULATORY_CARE_PROVIDER_SITE_OTHER): Payer: 59 | Admitting: Psychiatry

## 2023-07-23 ENCOUNTER — Other Ambulatory Visit (HOSPITAL_COMMUNITY): Payer: Self-pay

## 2023-07-23 ENCOUNTER — Encounter (HOSPITAL_COMMUNITY): Payer: Self-pay | Admitting: Ophthalmology

## 2023-07-23 DIAGNOSIS — F4323 Adjustment disorder with mixed anxiety and depressed mood: Secondary | ICD-10-CM | POA: Diagnosis not present

## 2023-07-23 NOTE — Telephone Encounter (Signed)
Oral Oncology Patient Advocate Encounter  New authorization   Received notification that prior authorization for Annett Fabian is required.   PA submitted verbally and chart notes faxed to [employer] on 07/23/23  Key [id number]   Status is pending     Ardeen Fillers, CPhT Oncology Pharmacy Patient Advocate  [facility]  [phone] (phone) [phone] (fax) 07/23/2023 12:26 PM

## 2023-07-23 NOTE — Progress Notes (Signed)
IN-PERSON  THERAPIST PROGRESS NOTE  Session Time:  Wednesday 07/23/2023 4:00 PM - 4:54 PM        Participation Level: Active  Behavioral Response: CasualAlert/sad, anxious,tearful at times  Type of Therapy: Individual Therapy  Treatment Goals addressed: Have healthy grieving process around loss Begin verbalizing feelings associated with loss     ProgressTowards Goals: Progressing  Interventions: Supportive  Summary: Deanna Bush is a 62 y.o. female who p is referred for services by PCP Dr. Lodema Hong due to pt experiencing symptoms of anxiety along with grief and loss issues. Pt denies any psychiatric hospitalizations. Pt has no previous involvement in outpatient therapy.  Per patient's report her stepdaughter was diagnosed with colon cancer at age 55 and died in 04-Nov-2022.  Patient was her caretaker and says patient died at her home.  Patient reports her husband left her about 2 weeks ago for 16 year old after 30 years of marriage.  Patient reports additional stress regarding providing care for her 23 year old mother.  Patient also has her own health issues as she was diagnosed with kidney cancer 2 years ago and had chemo.  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 She currently is taking experimental drugs.  Patient reports crying spells, sadness, irritability, worry, anxiety, and sleeping difficulty.   Patient last was seen about 6 weeks ago. She reports increased stress and anxiety since last session. Per her report, she learned yesterday her current medication for cancer is no longer working. Recent CT scan indicates more spots on her liver. Plans are for pt to begin a different medication but she fears side effects and possibly having decreased quality of life. She reports additional stress related to husband recently informing her he and his girlfriend are having a baby. Pt reports feeling disrespected, disappointed, hurt, and angry. She has been coping by  using her spirituality and support from family and friends.  Pt reports progress in legal process and says papers have been served to husband. She continues to work but anticipates being out of work once new treatment begins.    Suicidal/Homicidal: Nowithout intent/plan  Therapist Response: Reviewed symptoms, discussed stressors, facilitated pt expressing thoughts and feelings, validated feelings, encouraged pt to continue using support system and her spirituality, began to discuss how pt is reconstructing her personal values and beliefs, continued to discuss how pt is redefining relationships, discussed rationale for and developed plan with pt to practice beach visualization as a relaxation technique to cope with anxiety, developed plan with patient to practice between sessions, checked out interactive audio activity to patient and provided with access code to assist patient in her efforts  Plan: Return again in 2 weeks.  Diagnosis: Adjustment disorder with mixed anxiety and depressed mood  Collaboration of Care: Primary Care Provider AEB patient is seeing PCP Dr. [name] for medication management  Patient/Guardian was advised Release of Information must be obtained prior to any record release in order to collaborate their care with an outside provider. Patient/Guardian was advised if they have not already done so to contact the registration department to sign all necessary forms in order for [place] to release information regarding their care.   Consent: Patient/Guardian gives verbal consent for treatment and assignment of benefits for services provided during this visit. Patient/Guardian expressed understanding and agreed to proceed.   Adah Salvage, LCSW 07/23/2023

## 2023-07-24 ENCOUNTER — Other Ambulatory Visit (HOSPITAL_COMMUNITY): Payer: Self-pay

## 2023-07-24 ENCOUNTER — Telehealth: Payer: Self-pay

## 2023-07-24 ENCOUNTER — Telehealth (HOSPITAL_COMMUNITY): Payer: Self-pay | Admitting: Pharmacist

## 2023-07-24 DIAGNOSIS — C641 Malignant neoplasm of right kidney, except renal pelvis: Secondary | ICD-10-CM

## 2023-07-24 NOTE — Telephone Encounter (Signed)
Oral Chemotherapy Pharmacist Encounter  Patient Education I spoke with patient for overview of new oral chemotherapy medication: Welireg (belzutifan) for the treatment of metastatic clear cell renal cell carcinoma, planned duration until disease progression or unacceptable drug toxicity.  Counseled patient on administration, dosing, side effects, monitoring, drug-food interactions, safe handling, storage, and disposal.  Patient will take Welireg 40 mg tablets, 3 tablets (120 mg total) by mouth daily.  Start date: pending medication receipt from [facility], anticipate around ~07/28/23  Side effects include but not limited to: fatigue, shortness of breath, changes in electrolytes, decreased blood counts, LFT/SCr changes, edema, headache  Reviewed with patient importance of keeping a medication schedule and plan for any missed doses.  After discussion with patient no patient barriers to medication adherence identified.   Ms. Lanum voiced understanding and appreciation. All questions answered. Medication handout provided.  Provided patient with Oral Chemotherapy Navigation Clinic phone number. Patient knows to call the office with questions or concerns.  Lenord Carbo, PharmD, BCPS, [facility] Hematology/Oncology Clinical Pharmacist [facility] and [facility] Oral Chemotherapy Navigation Clinics [phone] 07/24/2023 12:34 PM

## 2023-07-24 NOTE — Telephone Encounter (Addendum)
Called and spoke to patient regarding approval of Annett Fabian and to let them know insurance required filling at [employer]. Patient knows to expect a call from [facility] to schedule first fill delivery. Patient knows co-pay card was obtained and faxed over to [facility]. Patient also knows to call me at [phone] with any questions or concerns regarding co-pay or receiving medication from [facility].   **Received notification from [facility] that they were unable to procure [facility]. Welireg can only be filled through [facility]. Script and all supporting documents redirected to [facility] for processing and fulfillment. I have called and spoken with the patient informing them of the pharmacy change. Patient expressed understanding and agreement.**   Ardeen Fillers, CPhT Oncology Pharmacy Patient Advocate  [facility]  [phone] (phone) [phone] (fax) 07/24/2023 11:13 AM

## 2023-07-24 NOTE — Telephone Encounter (Addendum)
Oral Oncology Patient Advocate Encounter   Was successful in obtaining a copay card for [facility].  This copay card will make the patients copay ~$0.00.    The billing information is as follows and has been shared with [facility].   RxBin: [id number] PCN: Loyalty Member ID: [id number] Group ID: [id number]   Ardeen Fillers, CPhT Oncology Pharmacy Patient Advocate  [facility]  [phone] (phone) [phone] (fax) 07/24/2023 10:52 AM

## 2023-07-24 NOTE — Telephone Encounter (Addendum)
Oral Oncology Patient Advocate Encounter  Prior Authorization for Deanna Bush has been approved.    PA# [id number]  Effective dates: 07/23/23 through 07/22/24  Patient must fill through [facility] per insurance.   Co-pay Card obtained to make co-pay ~$0.00.  Script and all supporting documents sent to [facility] for processing and fulfillment.    Ardeen Fillers, CPhT Oncology Pharmacy Patient Advocate  [facility]  [phone] (phone) [phone] (fax) 07/24/2023 10:38 AM

## 2023-07-25 MED ORDER — BELZUTIFAN 40 MG PO TABS
120.0000 mg | ORAL_TABLET | Freq: Every day | ORAL | 1 refills | Status: DC
Start: 2023-07-25 — End: 2023-09-24

## 2023-07-25 NOTE — Anesthesia Postprocedure Evaluation (Signed)
Anesthesia Post Note  Patient: Deanna Bush  Procedure(s) Performed: CATARACT EXTRACTION PHACO AND INTRAOCULAR LENS PLACEMENT (IOC) (Left: Eye)  Patient location during evaluation: Phase II Anesthesia Type: General Level of consciousness: awake Pain management: pain level controlled Vital Signs Assessment: post-procedure vital signs reviewed and stable Respiratory status: spontaneous breathing and respiratory function stable Cardiovascular status: blood pressure returned to baseline and stable Postop Assessment: no headache and no apparent nausea or vomiting Anesthetic complications: no Comments: Late entry   No notable events documented.   Last Vitals:  Vitals:   07/21/23 1123 07/21/23 1342  BP: 137/66 129/73  Pulse: 62 71  Resp: 17 (!) 22  Temp: 36.9 C 36.8 C  SpO2: 100% 100%    Last Pain:  Vitals:   07/21/23 1342  TempSrc: Oral  PainSc: 0-No pain                 Windell Norfolk

## 2023-07-25 NOTE — Addendum Note (Signed)
Addended byOtis Peak on: 07/25/2023 09:36 AM   Modules accepted: Orders

## 2023-07-25 NOTE — Telephone Encounter (Signed)
Oral Oncology Pharmacist Encounter   Notified by [facility] that they are unable to fill [facility] for patient. Prescription has been redirected to [facility] for dispensing.    Lenord Carbo, PharmD, BCPS, [facility] Hematology/Oncology Clinical Pharmacist [facility] and [facility] Oral Chemotherapy Navigation Clinics [phone] 07/25/2023 9:32 AM

## 2023-07-28 ENCOUNTER — Telehealth: Payer: Self-pay

## 2023-07-28 ENCOUNTER — Encounter: Payer: Self-pay | Admitting: "Endocrinology

## 2023-07-28 ENCOUNTER — Ambulatory Visit (INDEPENDENT_AMBULATORY_CARE_PROVIDER_SITE_OTHER): Payer: 59 | Admitting: "Endocrinology

## 2023-07-28 VITALS — BP 140/68 | HR 72 | Ht 66.0 in | Wt 148.0 lb

## 2023-07-28 DIAGNOSIS — E119 Type 2 diabetes mellitus without complications: Secondary | ICD-10-CM | POA: Diagnosis not present

## 2023-07-28 DIAGNOSIS — E782 Mixed hyperlipidemia: Secondary | ICD-10-CM | POA: Diagnosis not present

## 2023-07-28 DIAGNOSIS — I10 Essential (primary) hypertension: Secondary | ICD-10-CM | POA: Diagnosis not present

## 2023-07-28 DIAGNOSIS — E890 Postprocedural hypothyroidism: Secondary | ICD-10-CM

## 2023-07-28 LAB — POCT GLYCOSYLATED HEMOGLOBIN (HGB A1C): HbA1c, POC (controlled diabetic range): 5.8 % (ref 0.0–7.0)

## 2023-07-28 MED ORDER — ATORVASTATIN CALCIUM 40 MG PO TABS
40.0000 mg | ORAL_TABLET | Freq: Every day | ORAL | 1 refills | Status: DC
Start: 2023-07-28 — End: 2024-03-11

## 2023-07-28 MED ORDER — LEVOTHYROXINE SODIUM 125 MCG PO TABS
125.0000 ug | ORAL_TABLET | Freq: Every day | ORAL | 1 refills | Status: DC
Start: 2023-07-28 — End: 2023-12-02

## 2023-07-28 NOTE — Progress Notes (Signed)
07/28/2023, 8:40 AM  Endocrinology follow-up note   Deanna Bush is a 62 y.o.-year-old female patient being seen for follow-up after she was seen in consultation for hypothyroidism. PMD:  Kerri Perches, MD.   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    Cellulitis of leg, left    Complication of anesthesia    Hard to wake up   Depression    Diabetes mellitus without complication (HCC)    Pt denies   Family history of breast cancer 08/16/2011   Family history of breast cancer    Family history of colon cancer    Family history of kidney cancer    Family history of prostate cancer    Hyperlipidemia    Hypertension    Hypothyroidism    MRSA (methicillin resistant staph aureus) culture positive 08/19/2011   Pre-diabetes     Past Surgical History:  Procedure Laterality Date   BREAST SURGERY Left    mastectomy   CATARACT EXTRACTION W/PHACO Right 06/06/2023   Procedure: CATARACT EXTRACTION PHACO AND INTRAOCULAR LENS PLACEMENT (IOC);  Surgeon: Fabio Pierce, MD;  Location: [facility] OR[facility];  Service: Ophthalmology;  Laterality: Right;  CDE 5.61   CATARACT EXTRACTION W/PHACO Left 07/21/2023   Procedure: CATARACT EXTRACTION PHACO AND INTRAOCULAR LENS PLACEMENT (IOC);  Surgeon: Fabio Pierce, MD;  Location: [facility];  Service: Ophthalmology;  Laterality: Left;  CDE: 5.96   CESAREAN SECTION     x2   COLONOSCOPY N/A 03/03/2020   Procedure: COLONOSCOPY;  Surgeon: Corbin Ade, MD;  Location: [facility];  Service: Endoscopy;  Laterality: N/A;  12:00   IR IMAGING GUIDED PORT INSERTION  11/07/2021   left mastectomy     NEPHRECTOMY Right 02/06/2021   Procedure: NEPHRECTOMY- open radical;  Surgeon: Malen Gauze, MD;  Location: [facility];  Service: Urology;  Laterality: Right;   POLYPECTOMY  03/03/2020   Procedure: POLYPECTOMY;  Surgeon: Corbin Ade, MD;  Location: [facility]  END[facility] SUITE;  Service: Endoscopy;;    Social History   Socioeconomic History   Marital status: Married    Spouse name: Not on file   Number of children: 2   Years of education: Not on file   Highest education level: GED or equivalent  Occupational History   Occupation: CNA  Tobacco Use   Smoking status: Former    Current packs/day: 0.00    Average packs/day: 0.5 packs/day for 18.0 years (9.0 ttl pk-yrs)    Types: Cigarettes    Start date: 07/01/1984    Quit date: 07/01/2002    Years since quitting: 21.0   Smokeless tobacco: Never  Vaping Use   Vaping status: Never Used  Substance and Sexual Activity   Alcohol use: No   Drug use: No   Sexual activity: Yes    Birth control/protection: None  Other Topics Concern   Not on file  Social History Narrative   Not on file   Social Determinants of Health   Financial Resource Strain: Low Risk  (02/02/2023)  Overall Financial Resource Strain (CARDIA)    Difficulty of Paying Living Expenses: Not hard at all  Food Insecurity: No Food Insecurity (02/02/2023)   Hunger Vital Sign    Worried About Running Out of Food in the Last Year: Never true    Ran Out of Food in the Last Year: Never true  Transportation Needs: No Transportation Needs (02/02/2023)   PRAPARE - Administrator, Civil Service (Medical): No    Lack of Transportation (Non-Medical): No  Physical Activity: Insufficiently Active (02/02/2023)   Exercise Vital Sign    Days of Exercise per Week: 3 days    Minutes of Exercise per Session: 30 min  Stress: Stress Concern Present (02/02/2023)   [employer] of Occupational Health - Occupational Stress Questionnaire    Feeling of Stress : To some extent  Social Connections: Moderately Integrated (02/02/2023)   Social Connection and Isolation Panel [NHANES]    Frequency of Communication with Friends and Family: Three times a week    Frequency of Social Gatherings with Friends and Family: Three times a week    Attends  Religious Services: More than 4 times per year    Active Member of Clubs or Organizations: Yes    Attends Engineer, structural: More than 4 times per year    Marital Status: Separated    Family History  Problem Relation Age of Onset   Alcohol abuse Mother    Hypertension Mother    Diabetes Mother    Hyperlipidemia Mother    Colon cancer Maternal Aunt        dx 29s   Prostate cancer Maternal Uncle    Throat cancer Maternal Uncle    Kidney cancer Maternal Grandfather    Lung cancer Half-Sister    Kidney cancer Half-Sister 78   Breast cancer Half-Sister 46   Breast cancer Half-Sister 48    Outpatient Encounter Medications as of 07/28/2023  Medication Sig   acetaminophen (TYLENOL) 500 MG tablet Take 500 mg by mouth every 6 (six) hours as needed for moderate pain or headache.   atorvastatin (LIPITOR) 40 MG tablet Take 1 tablet (40 mg total) by mouth daily.   belzutifan (WELIREG) 40 MG tablet Take 3 tablets (120 mg total) by mouth daily.   benzonatate (TESSALON) 100 MG capsule Take 1 capsule (100 mg total) by mouth 2 (two) times daily as needed for cough.   EPINEPHrine 0.3 mg/0.3 mL IJ SOAJ injection Inject 0.3 mg into the muscle as needed for anaphylaxis.   Iron, Ferrous Sulfate, 325 (65 Fe) MG TABS Take 325 mg by mouth daily.   levothyroxine (SYNTHROID) 125 MCG tablet Take 1 tablet (125 mcg total) by mouth daily before breakfast.   mirtazapine (REMERON) 7.5 MG tablet TAKE ONE TABLET BY MOUTH ONCE DAILY AT BEDTIME (Patient not taking: Reported on 07/22/2023)   Multiple Vitamin (MULTIVITAMIN WITH MINERALS) TABS tablet Take 1 tablet by mouth daily.   Olmesartan-amLODIPine-HCTZ 40-5-25 MG TABS Take one tablet by mouth once daily for blood pressure   olmesartan-hydrochlorothiazide (BENICAR HCT) 40-25 MG tablet TAKE 1 TABLET BY MOUTH DAILY   UNABLE TO FIND MASTECTOMY BRA AND PROTHESIS DX Z90.12   UNABLE TO FIND 6 mastectomy prosthesis and bras.   urea (CARMOL) 10 % cream Apply  topically 2 (two) times daily. Apply twice daily to hands   [DISCONTINUED] atorvastatin (LIPITOR) 20 MG tablet Take 1 tablet (20 mg total) by mouth daily.   [DISCONTINUED] levothyroxine (SYNTHROID) 125 MCG tablet Take 1 tablet (125  mcg total) by mouth daily before breakfast.   Facility-Administered Encounter Medications as of 07/28/2023  Medication   heparin lock flush 100 unit/mL    ALLERGIES: Allergies  Allergen Reactions   Amlodipine Swelling    Leg edema   Sulfonamide Derivatives Itching   Yellow Jacket Venom [Bee Venom] Rash    urticaria   VACCINATION STATUS: Immunization History  Administered Date(s) Administered   Influenza Split 08/06/2012   Influenza,inj,Quad PF,6+ Mos 07/22/2018, 06/28/2019, 06/28/2020, 07/10/2021   Influenza-Unspecified 07/07/2022   Moderna Sars-Covid-2 Vaccination 12/07/2019, 01/07/2020, 08/09/2020, 04/05/2021   Pneumococcal Conjugate-13 03/29/2015   Pneumococcal Polysaccharide-23 04/18/2021   Tdap 03/12/2011, 07/12/2021   Zoster Recombinant(Shingrix) 05/05/2018, 07/02/2018     HPI    Deanna Bush  is a patient with the above medical history.  Her history starts at approximate age of 34 when she had Graves' disease which required RAI thyroid ablation.  She is currently on levothyroxine 125 mcg p.o. daily before breakfast.      She reports better compliance and consistency taking her medication this time including her statin.  Cholesterol profile is also improving.    She feels fatigued.    -She recently underwent right nephrectomy for renal cell cell carcinoma.    She denies fatigue, palpitations, tremors, heat/cold intolerance. -She reports fluctuating body weight. -She denies dysphagia, shortness of breath, no voice change.  She underwent thyroid ultrasound recently which did not show significant goiter , nor any discrete thyroid nodules.    she has family history of  thyroid disorders in her mother, but no family history of thyroid cancer.   -He is a former smoker.  She takes Lipitor 20 mg p.o. nightly.  Her LDL has improved to 145.  Her medical history also includes hyperlipidemia, type 2 diabetes.  She is on lifestyle management for her diabetes.  Her point-of-care A1c is 5.8%, overall improving from 6.6%.   ROS:  Limited as above.   Physical Exam: BP (!) 142/68   Pulse 72   Ht 5\' 6"  (1.676 m)   Wt 148 lb (67.1 kg)   BMI 23.89 kg/m  Wt Readings from Last 3 Encounters:  07/28/23 148 lb (67.1 kg)  07/22/23 147 lb 4.8 oz (66.8 kg)  07/21/23 149 lb 14.6 oz (68 kg)    Constitutional:  Body mass index is 23.89 kg/m., not in acute distress, normal state of mind   CMP ( most recent) CMP     Component Value Date/Time   NA 139 07/21/2023 0905   K 4.3 07/21/2023 0905   CL 99 07/21/2023 0905   CO2 24 07/21/2023 0905   GLUCOSE 92 07/21/2023 0905   GLUCOSE 112 (H) 07/15/2023 0803   BUN 18 07/21/2023 0905   CREATININE 1.10 (H) 07/21/2023 0905   CREATININE 0.73 04/22/2019 0848   CALCIUM 9.7 07/21/2023 0905   PROT 6.5 07/21/2023 0905   ALBUMIN 3.8 (L) 07/21/2023 0905   AST 26 07/21/2023 0905   ALT 14 07/21/2023 0905   ALKPHOS 91 07/21/2023 0905   BILITOT 0.4 07/21/2023 0905   GFRNONAA 55 (L) 07/15/2023 0803   GFRNONAA 90 04/22/2019 0848   GFRAA 96 10/30/2020 0914   GFRAA 104 04/22/2019 0848     Diabetic Labs (most recent): Lab Results  Component Value Date   HGBA1C 5.8 07/28/2023   HGBA1C 5.9 01/22/2023   HGBA1C 6.3 07/23/2022   MICROALBUR 0.2 10/23/2018   MICROALBUR <0.2 06/17/2017   MICROALBUR <3.0 (H) 04/03/2016     Lipid Panel (  most recent) Lipid Panel     Component Value Date/Time   CHOL 233 (H) 07/21/2023 0905   TRIG 84 07/21/2023 0905   HDL 74 07/21/2023 0905   CHOLHDL 3.1 07/21/2023 0905   CHOLHDL 2.7 10/23/2018 1148   VLDL 7 08/12/2016 0913   LDLCALC 145 (H) 07/21/2023 0905   LDLCALC 100 (H) 10/23/2018 1148   LABVLDL 14 07/21/2023 0905       Lab Results  Component Value Date    TSH 36.100 (H) 07/21/2023   TSH 57.200 (H) 01/13/2023   TSH 24.400 (H) 06/27/2022   TSH 10.365 (H) 03/18/2022   TSH 6.639 (H) 02/21/2022   TSH 3.486 01/24/2022   TSH 3.706 01/03/2022   TSH 2.455 12/13/2021   TSH 1.281 11/19/2021   TSH 0.980 08/28/2021   FREET4 1.13 07/21/2023   FREET4 0.94 01/13/2023   FREET4 1.01 06/27/2022   FREET4 1.25 02/28/2022   FREET4 1.44 08/28/2021   FREET4 1.30 09/04/2020   FREET4 1.1 03/28/2020   FREET4 0.7 (L) 01/03/2020       ASSESSMENT: 1. Hypothyroidism   2.  Type 2 diabetes  3.  Hyperlipidemia  PLAN:  -Her previsit thyroid function tests are consistent with improving thyroid hormone profile.  She is advised to continue levothyroxine 125 mcg p.o. daily before breakfast with better consistency.    - We discussed about the correct intake of her thyroid hormone, on empty stomach at fasting, with water, separated by at least 30 minutes from breakfast and other medications,  and separated by more than 4 hours from calcium, iron, multivitamins, acid reflux medications (PPIs). -Patient is made aware of the fact that thyroid hormone replacement is needed for life, dose to be adjusted by periodic monitoring of thyroid function tests.   -Her recent thyroid ultrasound was reviewed, no goiter, no discrete nodules. -She has controlled type 2 diabetes with point-of-care A1c of 5.8% today improving from 6.6%.  She is not on medications.   In light of her concurrent hyperlipidemia, she is still a good candidate for lifestyle medicine.  - she acknowledges that there is a room for improvement in her food and drink choices. - Suggestion is made for her to avoid simple carbohydrates  from her diet including Cakes, Sweet Desserts, Ice Cream, Soda (diet and regular), Sweet Tea, Candies, Chips, Cookies, Store Bought Juices, Alcohol , Artificial Sweeteners,  Coffee Creamer, and "Sugar-free" Products, Lemonade. This will help patient to have more stable blood glucose  profile and potentially avoid unintended weight gain.   She promises to continue to do better on her diet and engaging with lifestyle medicine.  She has uncontrolled hypertension with point-of-care blood pressure at 142/60.  She has not taken her blood pressure medication this morning.  She is encouraged to be consistent contact and take her blood pressure medications every morning.   She has olmesartan-hydrochlorothiazide 40-25 mg p.o. daily.  She is advised to maintain close follow-up with her PMD Dr. [name].   I spent  26  minutes in the care of the patient today including review of labs from Thyroid Function, CMP, and other relevant labs ; imaging/biopsy records (current and previous including abstractions from other facilities); face-to-face time discussing  her lab results and symptoms, medications doses, her options of short and long term treatment based on the latest standards of care / guidelines;   and documenting the encounter.  Bronwen Betters  participated in the discussions, expressed understanding, and voiced agreement with the above plans.  All questions were answered to her satisfaction. she is encouraged to contact clinic should she have any questions or concerns prior to her return visit.    Return in about 4 months (around 11/28/2023) for Fasting Labs  in AM B4 8, A1c -NV.  Marquis Lunch, MD [facility] [address] [place], [place] [address] Phone: [phone]  Fax: [phone]   07/28/2023, 8:40 AM  This note was partially dictated with voice recognition software. Similar sounding words can be transcribed inadequately or may not  be corrected upon review.

## 2023-07-28 NOTE — Patient Instructions (Signed)

## 2023-07-28 NOTE — Telephone Encounter (Signed)
Notified Patient of completion of FMLA forms. Forms placed for pick-up at [facility] as requested by Patient. No other needs or concerns voiced at this time.

## 2023-08-01 ENCOUNTER — Other Ambulatory Visit: Payer: Self-pay

## 2023-08-01 DIAGNOSIS — C641 Malignant neoplasm of right kidney, except renal pelvis: Secondary | ICD-10-CM

## 2023-08-07 ENCOUNTER — Other Ambulatory Visit: Payer: Self-pay

## 2023-08-07 ENCOUNTER — Inpatient Hospital Stay: Payer: 59

## 2023-08-07 DIAGNOSIS — C641 Malignant neoplasm of right kidney, except renal pelvis: Secondary | ICD-10-CM

## 2023-08-07 LAB — CBC WITH DIFFERENTIAL/PLATELET
Abs Immature Granulocytes: 0.02 10*3/uL (ref 0.00–0.07)
Basophils Absolute: 0 10*3/uL (ref 0.0–0.1)
Basophils Relative: 0 %
Eosinophils Absolute: 0 10*3/uL (ref 0.0–0.5)
Eosinophils Relative: 0 %
HCT: 32.8 % — ABNORMAL LOW (ref 36.0–46.0)
Hemoglobin: 10.2 g/dL — ABNORMAL LOW (ref 12.0–15.0)
Immature Granulocytes: 0 %
Lymphocytes Relative: 19 %
Lymphs Abs: 1.2 10*3/uL (ref 0.7–4.0)
MCH: 29.3 pg (ref 26.0–34.0)
MCHC: 31.1 g/dL (ref 30.0–36.0)
MCV: 94.3 fL (ref 80.0–100.0)
Monocytes Absolute: 0.3 10*3/uL (ref 0.1–1.0)
Monocytes Relative: 5 %
Neutro Abs: 4.9 10*3/uL (ref 1.7–7.7)
Neutrophils Relative %: 76 %
Platelets: 519 10*3/uL — ABNORMAL HIGH (ref 150–400)
RBC: 3.48 MIL/uL — ABNORMAL LOW (ref 3.87–5.11)
RDW: 13.1 % (ref 11.5–15.5)
WBC: 6.5 10*3/uL (ref 4.0–10.5)
nRBC: 0 % (ref 0.0–0.2)

## 2023-08-07 LAB — COMPREHENSIVE METABOLIC PANEL
ALT: 18 U/L (ref 0–44)
AST: 25 U/L (ref 15–41)
Albumin: 3.4 g/dL — ABNORMAL LOW (ref 3.5–5.0)
Alkaline Phosphatase: 110 U/L (ref 38–126)
Anion gap: 10 (ref 5–15)
BUN: 22 mg/dL (ref 8–23)
CO2: 29 mmol/L (ref 22–32)
Calcium: 10 mg/dL (ref 8.9–10.3)
Chloride: 93 mmol/L — ABNORMAL LOW (ref 98–111)
Creatinine, Ser: 1.03 mg/dL — ABNORMAL HIGH (ref 0.44–1.00)
GFR, Estimated: >60 mL/min (ref >60)
Glucose, Bld: 111 mg/dL — ABNORMAL HIGH (ref 70–99)
Potassium: 4.5 mmol/L (ref 3.5–5.1)
Sodium: 132 mmol/L — ABNORMAL LOW (ref 135–145)
Total Bilirubin: 0.3 mg/dL (ref 0.3–1.2)
Total Protein: 7.4 g/dL (ref 6.5–8.1)

## 2023-08-07 LAB — URINALYSIS, MICROSCOPIC (REFLEX)
Bacteria, UA: NONE SEEN
RBC / HPF: >50 RBC/hpf (ref 0–5)
Squamous Epithelial / HPF: NONE SEEN /[HPF] (ref 0–5)
WBC, UA: NONE SEEN WBC/hpf (ref 0–5)

## 2023-08-07 LAB — URINALYSIS, ROUTINE W REFLEX MICROSCOPIC

## 2023-08-07 LAB — PROTIME-INR
INR: 0.9 (ref 0.8–1.2)
Prothrombin Time: 12.3 s (ref 11.4–15.2)

## 2023-08-07 LAB — APTT: aPTT: 30 s (ref 24–36)

## 2023-08-07 LAB — MAGNESIUM: Magnesium: 2.1 mg/dL (ref 1.7–2.4)

## 2023-08-08 ENCOUNTER — Other Ambulatory Visit (HOSPITAL_COMMUNITY): Payer: Self-pay | Admitting: Hematology

## 2023-08-08 MED ORDER — HYDROCODONE-ACETAMINOPHEN 5-325 MG PO TABS: 1.0000 | ORAL_TABLET | Freq: Four times a day (QID) | ORAL | 0 refills | Status: AC | PRN

## 2023-08-08 NOTE — Progress Notes (Signed)
I talked to the patient and reviewed labs with her. Complains of right sided back pain (new), but denies dysuria. She went from diarrhea to constipation when she stopped taking lenvatinib and everolimus. Started Belzutifan 2 weeks ago.Hematuria stopped today. Sent Norco 5/325 q6h Prn. Told her to take stool softener daily. She reported that she's too weak to work.

## 2023-08-11 ENCOUNTER — Other Ambulatory Visit: Payer: Self-pay

## 2023-08-11 DIAGNOSIS — C641 Malignant neoplasm of right kidney, except renal pelvis: Secondary | ICD-10-CM

## 2023-08-11 NOTE — Progress Notes (Signed)
Lab orders entered

## 2023-08-12 ENCOUNTER — Inpatient Hospital Stay: Payer: 59 | Attending: Hematology | Admitting: Hematology

## 2023-08-12 ENCOUNTER — Ambulatory Visit (HOSPITAL_COMMUNITY): Payer: 59 | Admitting: Psychiatry

## 2023-08-12 ENCOUNTER — Inpatient Hospital Stay: Payer: 59

## 2023-08-12 VITALS — BP 126/66 | HR 76 | Temp 98.0°F | Resp 16 | Wt 149.6 lb

## 2023-08-12 DIAGNOSIS — D509 Iron deficiency anemia, unspecified: Secondary | ICD-10-CM | POA: Diagnosis not present

## 2023-08-12 DIAGNOSIS — N189 Chronic kidney disease, unspecified: Secondary | ICD-10-CM | POA: Insufficient documentation

## 2023-08-12 DIAGNOSIS — K5900 Constipation, unspecified: Secondary | ICD-10-CM | POA: Diagnosis not present

## 2023-08-12 DIAGNOSIS — Z87891 Personal history of nicotine dependence: Secondary | ICD-10-CM | POA: Insufficient documentation

## 2023-08-12 DIAGNOSIS — R7989 Other specified abnormal findings of blood chemistry: Secondary | ICD-10-CM | POA: Insufficient documentation

## 2023-08-12 DIAGNOSIS — Z800 Family history of malignant neoplasm of digestive organs: Secondary | ICD-10-CM | POA: Insufficient documentation

## 2023-08-12 DIAGNOSIS — Z8042 Family history of malignant neoplasm of prostate: Secondary | ICD-10-CM | POA: Insufficient documentation

## 2023-08-12 DIAGNOSIS — C50912 Malignant neoplasm of unspecified site of left female breast: Secondary | ICD-10-CM | POA: Diagnosis not present

## 2023-08-12 DIAGNOSIS — I129 Hypertensive chronic kidney disease with stage 1 through stage 4 chronic kidney disease, or unspecified chronic kidney disease: Secondary | ICD-10-CM | POA: Insufficient documentation

## 2023-08-12 DIAGNOSIS — Z905 Acquired absence of kidney: Secondary | ICD-10-CM | POA: Diagnosis not present

## 2023-08-12 DIAGNOSIS — E039 Hypothyroidism, unspecified: Secondary | ICD-10-CM | POA: Insufficient documentation

## 2023-08-12 DIAGNOSIS — R319 Hematuria, unspecified: Secondary | ICD-10-CM | POA: Insufficient documentation

## 2023-08-12 DIAGNOSIS — Z9012 Acquired absence of left breast and nipple: Secondary | ICD-10-CM | POA: Insufficient documentation

## 2023-08-12 DIAGNOSIS — Z801 Family history of malignant neoplasm of trachea, bronchus and lung: Secondary | ICD-10-CM | POA: Insufficient documentation

## 2023-08-12 DIAGNOSIS — Z853 Personal history of malignant neoplasm of breast: Secondary | ICD-10-CM | POA: Diagnosis not present

## 2023-08-12 DIAGNOSIS — Z803 Family history of malignant neoplasm of breast: Secondary | ICD-10-CM | POA: Insufficient documentation

## 2023-08-12 DIAGNOSIS — M545 Low back pain, unspecified: Secondary | ICD-10-CM | POA: Insufficient documentation

## 2023-08-12 DIAGNOSIS — C641 Malignant neoplasm of right kidney, except renal pelvis: Secondary | ICD-10-CM

## 2023-08-12 DIAGNOSIS — Z8051 Family history of malignant neoplasm of kidney: Secondary | ICD-10-CM | POA: Diagnosis not present

## 2023-08-12 DIAGNOSIS — D631 Anemia in chronic kidney disease: Secondary | ICD-10-CM | POA: Insufficient documentation

## 2023-08-12 LAB — COMPREHENSIVE METABOLIC PANEL
ALT: 21 U/L (ref 0–44)
AST: 31 U/L (ref 15–41)
Albumin: 3.4 g/dL — ABNORMAL LOW (ref 3.5–5.0)
Alkaline Phosphatase: 108 U/L (ref 38–126)
Anion gap: 11 (ref 5–15)
BUN: 19 mg/dL (ref 8–23)
CO2: 28 mmol/L (ref 22–32)
Calcium: 9.5 mg/dL (ref 8.9–10.3)
Chloride: 92 mmol/L — ABNORMAL LOW (ref 98–111)
Creatinine, Ser: 1.16 mg/dL — ABNORMAL HIGH (ref 0.44–1.00)
GFR, Estimated: 53 mL/min — ABNORMAL LOW (ref >60)
Glucose, Bld: 109 mg/dL — ABNORMAL HIGH (ref 70–99)
Potassium: 4.5 mmol/L (ref 3.5–5.1)
Sodium: 131 mmol/L — ABNORMAL LOW (ref 135–145)
Total Bilirubin: 0.3 mg/dL (ref <1.2)
Total Protein: 7.2 g/dL (ref 6.5–8.1)

## 2023-08-12 LAB — CBC WITH DIFFERENTIAL/PLATELET
Abs Immature Granulocytes: 0.01 10*3/uL (ref 0.00–0.07)
Basophils Absolute: 0 10*3/uL (ref 0.0–0.1)
Basophils Relative: 0 %
Eosinophils Absolute: 0 10*3/uL (ref 0.0–0.5)
Eosinophils Relative: 0 %
HCT: 32.7 % — ABNORMAL LOW (ref 36.0–46.0)
Hemoglobin: 10 g/dL — ABNORMAL LOW (ref 12.0–15.0)
Immature Granulocytes: 0 %
Lymphocytes Relative: 23 %
Lymphs Abs: 1.6 10*3/uL (ref 0.7–4.0)
MCH: 28.9 pg (ref 26.0–34.0)
MCHC: 30.6 g/dL (ref 30.0–36.0)
MCV: 94.5 fL (ref 80.0–100.0)
Monocytes Absolute: 0.3 10*3/uL (ref 0.1–1.0)
Monocytes Relative: 5 %
Neutro Abs: 5 10*3/uL (ref 1.7–7.7)
Neutrophils Relative %: 72 %
Platelets: 504 10*3/uL — ABNORMAL HIGH (ref 150–400)
RBC: 3.46 MIL/uL — ABNORMAL LOW (ref 3.87–5.11)
RDW: 13.2 % (ref 11.5–15.5)
WBC: 6.9 10*3/uL (ref 4.0–10.5)
nRBC: 0 % (ref 0.0–0.2)

## 2023-08-12 NOTE — Progress Notes (Signed)
[facility] [address] [place] [address]    Clinic Day:  08/12/23   Referring physician: Kerri Perches, MD  Patient Care Team: Kerri Perches, MD as PCP - General Mallipeddi, Orion Modest, MD as PCP - Cardiology (Cardiology) Doreatha Massed, MD as Medical Oncologist (Medical Oncology)   ASSESSMENT & PLAN:   Assessment: 1. Metastatic clear-cell renal cell carcinoma: - Right radical nephrectomy on 02/06/2021. - Pathology shows clear-cell RCC, grade 4, 12.5 cm, necrosis and rhabdoid features are identified.  Tumor extends into the and invades the wall of the vena cava (pT3c).  Vascular, ureteral and all margins of resection are negative for tumor. - CT scan abdomen with and without contrast on 05/29/2021 showed several nodules along the peritoneal surface of the right nephrectomy bed warranting close attention. - CTAP with and without contrast on 09/18/2021 showed progressive nodularity within the right nephrectomy bed, with enlarged nodules adjacent to the right hepatic lobe extending inferiorly along the right retroperitoneum with multiple enlarged enhancing nodules.  Direct metastatic invasion into the right hepatic lobe.  Small nodule at the right lung base favored to be benign. - IMDC: Intermediate risk group with 2 features including diagnosis less than 1 year and hemoglobin lower than normal. - Bone scan on 10/05/2021 with focal activity in the mental vertex of the mandible which could be inflammatory/traumatic/metastatic. - CT chest on 10/03/2021 showed several lung nodules scattered bilaterally some of which could be metastatic and many others could be due to mucoid impaction. - NGS testing did not show any targetable mutations.  TMB was low.  CCND3 amplified.  PBRM1 VUS present. - Right nephrectomy bed biopsy (10/22/2021): Clear-cell renal cell carcinoma with necrosis - 4 cycles of ipilimumab and nivolumab from 11/19/2021 through 01/24/2022 - CT CAP  on 02/14/2022: Numerous new and enlarged lung nodules.  Significant interval increase in the mass in the right renal bed.  New hypodense liver lesions. - Lenvatinib (12 mg) and everolimus 5 mg daily started on 03/13/2022, discontinued on 07/22/2023 due to progression. - Belzutifan 120 mg daily started on 07/29/2023   2. Social/family history: - She lives at home with her husband and also takes care of her mother. - She works as a Child psychotherapist at [employer].  She quit smoking in 2004 and smoked half pack per day for 10 years. - Half sister (same mother) had kidney cancer at age 92.  Maternal grandfather had kidney cancer.  Sister died of lung cancer.  2 half sisters (same father) had breast cancers.  3.  Left breast stage I tubular adenocarcinoma: - She underwent left mastectomy on 06/11/2002.  0/6 lymph nodes positive.  ER 90%, PR 92%, Ki-67 6%, HER2 negative.  As per chart, declined antiestrogen therapy.    Plan: 1. Metastatic clear-cell RCC, intermediate risk by IMDC: - She started taking belzutifan 120 mg on 07/29/2023. - She called our office with hematuria last week which lasted about 1 day.  She had 1 more episode of hematuria on Saturday. - Diarrhea has subsided since she had stopped the lenvatinib and everolimus. - Reviewed labs today: Normal LFTs.  Creatinine mildly elevated at 1.16 and stable.  CBC grossly normal. - Continue belzutifan 120 mg daily. - Will repeat CBC, ferritin and iron panel in 2 weeks.  RTC 4 weeks for follow-up with repeat labs.  2.  Normocytic anemia: - Normocytic anemia from CKD, functional iron deficiency and belzutifan. - Hemoglobin today is 10.  Will repeat  ferritin and iron panel and CBC in 2 weeks.  3.  Hypothyroidism: - Continue Synthroid 125 mcg daily.  Last TSH was 36 on 07/21/2023.   4.  Hypertension: - Continue olmesartan/HCTZ 40 mg / 25 mg daily.  Blood pressure is 126/66.  5.  Constipation: - She has developed constipation after  stopping lenvatinib and everolimus. - She stopped taking iron tablet 2 days ago because of worsening constipation. - Continue stool softener daily.  6.  Right loin pain: - She developed right loin pain 1 week after she stopped taking lenvatinib and everolimus. - I have given prescription for hydrocodone 5/325 mg every 6 hours as needed.  She took half pill of hydrocodone each on Friday and Saturday.  She reported pain subsequently has gotten better.  Pain is mostly worse at nighttime.  I have recommended that she take half a pill at bedtime.    Orders Placed This Encounter  Procedures   CBC    Standing Status:   Future    Standing Expiration Date:   08/11/2024   Iron and TIBC ([facility] ONLY)    Standing Status:   Future    Standing Expiration Date:   08/11/2024   Ferritin    Standing Status:   Future    Standing Expiration Date:   08/11/2024   CBC with Differential    Standing Status:   Future    Standing Expiration Date:   08/11/2024   Comprehensive metabolic panel    Standing Status:   Future    Standing Expiration Date:   08/11/2024   Magnesium    Standing Status:   Future    Standing Expiration Date:   08/11/2024   Sample to Blood Bank(Blood Bank Hold)    Standing Status:   Future    Standing Expiration Date:   08/11/2024      Alben Deeds Teague,acting as a scribe for Doreatha Massed, MD.,have documented all relevant documentation on the behalf of Doreatha Massed, MD,as directed by  Doreatha Massed, MD while in the presence of Doreatha Massed, MD.  I, Doreatha Massed MD, have reviewed the above documentation for accuracy and completeness, and I agree with the above.      Doreatha Massed, MD   11/5/20246:26 PM  CHIEF COMPLAINT:   Diagnosis: metastatic clear-cell renal cell carcinoma    Cancer Staging  Malignant neoplasm of female breast [facility]) Staging form: Breast, AJCC 6th Edition - Clinical: Stage I (T1b, N0, M0) - Signed by  Ellouise Newer, PA on 08/16/2011  Renal cell cancer [name]) Staging form: Kidney, AJCC 8th Edition - Clinical stage from 09/26/2021: Stage IV (cT3c, cNX, cM1) - Unsigned    Prior Therapy: Nivolumab plus ipilimumab for 4 cycles with progression, lenvatinib and everolimus  Current Therapy: Belzutifan   HISTORY OF PRESENT ILLNESS:   Oncology History  Renal cell cancer (HCC)  06/20/2021 Initial Diagnosis   Renal cell carcinoma of right kidney (HCC)   11/19/2021 - 01/24/2022 Chemotherapy   Patient is on Treatment Plan : RENAL CELL CARCINOMA Nivolumab + Ipilimumab q21d / Nivolumab q28d      Genetic Testing   Negative genetic testing. No pathogenic variants identified on the [facility] Multi-Cancer Panel + RNA. VUS in PMS2 called c.1732C>A and in RECQL4 called c.2967G>A  Identified. The report date is 11/01/2021.   The Multi-Cancer Panel + RNA offered by [facility] includes sequencing and/or deletion duplication testing of the following 84 genes: AIP, ALK, APC, ATM, AXIN2,BAP1,  BARD1, BLM, BMPR1A, BRCA1, BRCA2,  BRIP1, CASR, CDC73, CDH1, CDK4, CDKN1B, CDKN1C, CDKN2A (p14ARF), CDKN2A (p16INK4a), CEBPA, CHEK2, CTNNA1, DICER1, DIS3L2, EGFR (c.2369C>T, p.Thr790Met variant only), EPCAM (Deletion/duplication testing only), FH, FLCN, GATA2, GPC3, GREM1 (Promoter region deletion/duplication testing only), HOXB13 (c.251G>A, p.Gly84Glu), HRAS, KIT, MAX, MEN1, MET, MITF (c.952G>A, p.Glu318Lys variant only), MLH1, MSH2, MSH3, MSH6, MUTYH, NBN, NF1, NF2, NTHL1, PALB2, PDGFRA, PHOX2B, PMS2, POLD1, POLE, POT1, PRKAR1A, PTCH1, PTEN, RAD50, RAD51C, RAD51D, RB1, RECQL4, RET, RUNX1, SDHAF2, SDHA (sequence changes only), SDHB, SDHC, SDHD, SMAD4, SMARCA4, SMARCB1, SMARCE1, STK11, SUFU, TERC, TERT, TMEM127, TP53, TSC1, TSC2, VHL, WRN and WT1.       INTERVAL HISTORY:   Abisola is a 62 y.o. female presenting to clinic today for follow up of metastatic clear-cell renal cell carcinoma. She was last seen by me on  07/22/23.  Today, she states that she is doing well overall. Her appetite level is at 100%. Her energy level is at 50%.  She had one episode of hematuria that was bright red on 08/09/23. She denies dysuria. Her back pain is improved from last visit. She took 0.5 pill of Norco on 11/1 and 0.5 pill of Norco on 11/2. If pain is mild she will take 2 OTC Tylenol. She reports pain started after discontinuing last chemotherapy for 1 week. She started Belzutifan on 07/29/23. She notes increased tiredness that began 2-3 days ago and is gradually worsening. She has not taken iron supplements for the last  2-3 days due to constipation. She is drinking 1-2 Ensures a day. Her appetite has improved since starting Belzutifan, gaining 2 pounds since her last visit.   PAST MEDICAL HISTORY:   Past Medical History: Past Medical History:  Diagnosis Date   Adenocarcinoma of breast (HCC)    left    Cellulitis of leg, left    Complication of anesthesia    Hard to wake up   Depression    Diabetes mellitus without complication (HCC)    Pt denies   Family history of breast cancer 08/16/2011   Family history of breast cancer    Family history of colon cancer    Family history of kidney cancer    Family history of prostate cancer    Hyperlipidemia    Hypertension    Hypothyroidism    MRSA (methicillin resistant staph aureus) culture positive 08/19/2011   Pre-diabetes     Surgical History: Past Surgical History:  Procedure Laterality Date   BREAST SURGERY Left    mastectomy   CATARACT EXTRACTION W/PHACO Right 06/06/2023   Procedure: CATARACT EXTRACTION PHACO AND INTRAOCULAR LENS PLACEMENT (IOC);  Surgeon: Fabio Pierce, MD;  Location: [facility];  Service: Ophthalmology;  Laterality: Right;  CDE 5.61   CATARACT EXTRACTION W/PHACO Left 07/21/2023   Procedure: CATARACT EXTRACTION PHACO AND INTRAOCULAR LENS PLACEMENT (IOC);  Surgeon: Fabio Pierce, MD;  Location: [facility];  Service: Ophthalmology;  Laterality:  Left;  CDE: 5.96   CESAREAN SECTION     x2   COLONOSCOPY N/A 03/03/2020   Procedure: COLONOSCOPY;  Surgeon: Corbin Ade, MD;  Location: [facility];  Service: Endoscopy;  Laterality: N/A;  12:00   IR IMAGING GUIDED PORT INSERTION  11/07/2021   left mastectomy     NEPHRECTOMY Right 02/06/2021   Procedure: NEPHRECTOMY- open radical;  Surgeon: Malen Gauze, MD;  Location: [facility];  Service: Urology;  Laterality: Right;   POLYPECTOMY  03/03/2020   Procedure: POLYPECTOMY;  Surgeon: Corbin Ade, MD;  Location: [facility];  Service: Endoscopy;;    Social  History: Social History   Socioeconomic History   Marital status: Married    Spouse name: Not on file   Number of children: 2   Years of education: Not on file   Highest education level: GED or equivalent  Occupational History   Occupation: CNA  Tobacco Use   Smoking status: Former    Current packs/day: 0.00    Average packs/day: 0.5 packs/day for 18.0 years (9.0 ttl pk-yrs)    Types: Cigarettes    Start date: 07/01/1984    Quit date: 07/01/2002    Years since quitting: 21.1   Smokeless tobacco: Never  Vaping Use   Vaping status: Never Used  Substance and Sexual Activity   Alcohol use: No   Drug use: No   Sexual activity: Yes    Birth control/protection: None  Other Topics Concern   Not on file  Social History Narrative   Not on file   Social Determinants of Health   Financial Resource Strain: Low Risk  (02/02/2023)   Overall Financial Resource Strain (CARDIA)    Difficulty of Paying Living Expenses: Not hard at all  Food Insecurity: No Food Insecurity (02/02/2023)   Hunger Vital Sign    Worried About Running Out of Food in the Last Year: Never true    Ran Out of Food in the Last Year: Never true  Transportation Needs: No Transportation Needs (02/02/2023)   PRAPARE - Administrator, Civil Service (Medical): No    Lack of Transportation (Non-Medical): No  Physical Activity: Insufficiently Active  (02/02/2023)   Exercise Vital Sign    Days of Exercise per Week: 3 days    Minutes of Exercise per Session: 30 min  Stress: Stress Concern Present (02/02/2023)   [employer] of Occupational Health - Occupational Stress Questionnaire    Feeling of Stress : To some extent  Social Connections: Moderately Integrated (02/02/2023)   Social Connection and Isolation Panel [NHANES]    Frequency of Communication with Friends and Family: Three times a week    Frequency of Social Gatherings with Friends and Family: Three times a week    Attends Religious Services: More than 4 times per year    Active Member of Clubs or Organizations: Yes    Attends Engineer, structural: More than 4 times per year    Marital Status: Separated  Intimate Partner Violence: Not on file    Family History: Family History  Problem Relation Age of Onset   Alcohol abuse Mother    Hypertension Mother    Diabetes Mother    Hyperlipidemia Mother    Colon cancer Maternal Aunt        dx 26s   Prostate cancer Maternal Uncle    Throat cancer Maternal Uncle    Kidney cancer Maternal Grandfather    Lung cancer Half-Sister    Kidney cancer Half-Sister 4   Breast cancer Half-Sister 44   Breast cancer Half-Sister 50    Current Medications:  Current Outpatient Medications:    acetaminophen (TYLENOL) 500 MG tablet, Take 500 mg by mouth every 6 (six) hours as needed for moderate pain or headache., Disp: , Rfl:    atorvastatin (LIPITOR) 40 MG tablet, Take 1 tablet (40 mg total) by mouth daily., Disp: 90 tablet, Rfl: 1   belzutifan (WELIREG) 40 MG tablet, Take 3 tablets (120 mg total) by mouth daily., Disp: 90 tablet, Rfl: 1   benzonatate (TESSALON) 100 MG capsule, Take 1 capsule (100 mg total) by mouth 2 (  two) times daily as needed for cough., Disp: 20 capsule, Rfl: 0   EPINEPHrine 0.3 mg/0.3 mL IJ SOAJ injection, Inject 0.3 mg into the muscle as needed for anaphylaxis., Disp: 2 each, Rfl: 0    HYDROcodone-acetaminophen (NORCO) 5-325 MG tablet, Take 1 tablet by mouth every 6 (six) hours as needed for moderate pain (pain score 4-6)., Disp: 30 tablet, Rfl: 0   Iron, Ferrous Sulfate, 325 (65 Fe) MG TABS, Take 325 mg by mouth daily., Disp: 30 tablet, Rfl: 3   levothyroxine (SYNTHROID) 125 MCG tablet, Take 1 tablet (125 mcg total) by mouth daily before breakfast., Disp: 90 tablet, Rfl: 1   mirtazapine (REMERON) 7.5 MG tablet, TAKE ONE TABLET BY MOUTH ONCE DAILY AT BEDTIME, Disp: 90 tablet, Rfl: 3   Multiple Vitamin (MULTIVITAMIN WITH MINERALS) TABS tablet, Take 1 tablet by mouth daily., Disp: , Rfl:    olmesartan-hydrochlorothiazide (BENICAR HCT) 40-25 MG tablet, TAKE 1 TABLET BY MOUTH DAILY, Disp: 30 tablet, Rfl: 1   UNABLE TO FIND, MASTECTOMY BRA AND PROTHESIS DX Z90.12, Disp: 6 each, Rfl: 2   UNABLE TO FIND, 6 mastectomy prosthesis and bras., Disp: 6 each, Rfl: 0   urea (CARMOL) 10 % cream, Apply topically 2 (two) times daily. Apply twice daily to hands, Disp: 71 g, Rfl: 0 No current facility-administered medications for this visit.  Facility-Administered Medications Ordered in Other Visits:    heparin lock flush 100 unit/mL, 500 Units, Intravenous, Once, Doreatha Massed, MD   Allergies: Allergies  Allergen Reactions   Amlodipine Swelling    Leg edema   Sulfonamide Derivatives Itching   Yellow Jacket Venom [Bee Venom] Rash    urticaria    REVIEW OF SYSTEMS:   Review of Systems  Constitutional:  Negative for chills, fatigue and fever.       +generalized weakness  HENT:   Negative for lump/mass, mouth sores, nosebleeds, sore throat and trouble swallowing.   Eyes:  Negative for eye problems.  Respiratory:  Negative for cough and shortness of breath.   Cardiovascular:  Negative for chest pain, leg swelling and palpitations.  Gastrointestinal:  Positive for constipation. Negative for abdominal pain, diarrhea, nausea and vomiting.  Genitourinary:  Negative for bladder  incontinence, difficulty urinating, dysuria, frequency, hematuria and nocturia.   Musculoskeletal:  Positive for back pain (3/10 severity). Negative for arthralgias, flank pain, myalgias and neck pain.  Skin:  Negative for itching and rash.  Neurological:  Negative for dizziness, headaches and numbness.  Hematological:  Does not bruise/bleed easily.  Psychiatric/Behavioral:  Positive for sleep disturbance. Negative for depression and suicidal ideas. The patient is not nervous/anxious.   All other systems reviewed and are negative.    VITALS:   Blood pressure 126/66, pulse 76, temperature 98 F (36.7 C), temperature source Oral, resp. rate 16, weight 149 lb 9.6 oz (67.9 kg), SpO2 97%.  Wt Readings from Last 3 Encounters:  08/12/23 149 lb 9.6 oz (67.9 kg)  07/28/23 148 lb (67.1 kg)  07/22/23 147 lb 4.8 oz (66.8 kg)    Body mass index is 24.15 kg/m.  Performance status (ECOG): 0 - Asymptomatic  PHYSICAL EXAM:   Physical Exam Vitals and nursing note reviewed. Exam conducted with a chaperone present.  Constitutional:      Appearance: Normal appearance.  Cardiovascular:     Rate and Rhythm: Normal rate and regular rhythm.     Pulses: Normal pulses.     Heart sounds: Normal heart sounds.  Pulmonary:     Effort: Pulmonary effort  is normal.     Breath sounds: Normal breath sounds.  Abdominal:     Palpations: Abdomen is soft. There is no hepatomegaly, splenomegaly or mass.     Tenderness: There is no abdominal tenderness.  Musculoskeletal:     Right lower leg: No edema.     Left lower leg: No edema.     Comments: +right loin pain, in lower back  Lymphadenopathy:     Cervical: No cervical adenopathy.     Right cervical: No superficial, deep or posterior cervical adenopathy.    Left cervical: No superficial, deep or posterior cervical adenopathy.     Upper Body:     Right upper body: No supraclavicular or axillary adenopathy.     Left upper body: No supraclavicular or axillary  adenopathy.  Neurological:     General: No focal deficit present.     Mental Status: She is alert and oriented to person, place, and time.  Psychiatric:        Mood and Affect: Mood normal.        Behavior: Behavior normal.     LABS:      Latest Ref Rng & Units 08/12/2023   10:04 AM 08/07/2023    9:10 AM 07/15/2023    8:03 AM  CBC  WBC 4.0 - 10.5 K/uL 6.9  6.5  4.7   Hemoglobin 12.0 - 15.0 g/dL 34.7  42.5  95.6   Hematocrit 36.0 - 46.0 % 32.7  32.8  37.1   Platelets 150 - 400 K/uL 504  519  294       Latest Ref Rng & Units 08/12/2023   10:04 AM 08/07/2023    9:10 AM 07/21/2023    9:05 AM  CMP  Glucose 70 - 99 mg/dL 387  564  92   BUN 8 - 23 mg/dL 19  22  18    Creatinine 0.44 - 1.00 mg/dL 3.32  9.51  8.84   Sodium 135 - 145 mmol/L 131  132  139   Potassium 3.5 - 5.1 mmol/L 4.5  4.5  4.3   Chloride 98 - 111 mmol/L 92  93  99   CO2 22 - 32 mmol/L 28  29  24    Calcium 8.9 - 10.3 mg/dL 9.5  16.6  9.7   Total Protein 6.5 - 8.1 g/dL 7.2  7.4  6.5   Total Bilirubin <1.2 mg/dL 0.3  0.3  0.4   Alkaline Phos 38 - 126 U/L 108  110  91   AST 15 - 41 U/L 31  25  26    ALT 0 - 44 U/L 21  18  14       No results found for: "CEA1", "CEA" / No results found for: "CEA1", "CEA" No results found for: "PSA1" No results found for: "AYT016" No results found for: "CAN125"  No results found for: "TOTALPROTELP", "ALBUMINELP", "A1GS", "A2GS", "BETS", "BETA2SER", "GAMS", "MSPIKE", "SPEI" Lab Results  Component Value Date   TIBC 308 07/15/2023   TIBC 307 04/29/2023   TIBC 280 02/28/2023   FERRITIN 180 07/15/2023   FERRITIN 152 04/29/2023   FERRITIN 85 02/28/2023   IRONPCTSAT 21 07/15/2023   IRONPCTSAT 20 04/29/2023   IRONPCTSAT 16 02/28/2023   Lab Results  Component Value Date   LDH 178 07/15/2023   LDH 178 02/21/2022   LDH 152 09/27/2021     STUDIES:   CT CHEST ABDOMEN PELVIS W CONTRAST  Result Date: 07/15/2023 CLINICAL DATA:  Follow-up metastatic renal cell carcinoma *  Tracking Code: BO * EXAM: CT CHEST, ABDOMEN, AND PELVIS WITH CONTRAST TECHNIQUE: Multidetector CT imaging of the chest, abdomen and pelvis was performed following the standard protocol during bolus administration of intravenous contrast. RADIATION DOSE REDUCTION: This exam was performed according to the departmental dose-optimization program which includes automated exposure control, adjustment of the mA and/or kV according to patient size and/or use of iterative reconstruction technique. CONTRAST:  OMNIPAQUE IOHEXOL 300 MG/ML  SOLN COMPARISON:  Multiple priors including most recent CT April 29, 2023 FINDINGS: CT CHEST FINDINGS Cardiovascular: Right chest Port-A-Cath with tip at the superior cavoatrial junction. Aortic atherosclerosis. No central pulmonary embolus on this nondedicated study. Normal size heart. No significant pericardial effusion/thickening. Mediastinum/Nodes: New pathologically enlarged prevascular lymph node measures 10 mm in short axis on image 24/3 previously 5 mm. Prior left axillary lymph node dissection. Mild symmetric esophageal wall thickening. No suspicious thyroid lesion. Lungs/Pleura: Increased small right pleural effusion and adjacent airspace opacities. Scattered atelectasis/scarring. Irregular nodule in the anterior left upper lobe measures 8 x 6 mm on image 55/5, unchanged. No new suspicious pulmonary nodules or masses. Musculoskeletal: Prior left mastectomy. CT ABDOMEN PELVIS FINDINGS Hepatobiliary: Bilobar hypodense hepatic lesions again seen some of which have increased in size and others are stable. No new suspicious hepatic lesion identified. For reference: -central segment IV lesion measures 2.6 x 2.1 cm on image 54/3 previously 2.5 x 2.2 cm Segment VII hepatic lesion measures 13 mm on image 44/3 previously 11 mm. Gallbladder is unremarkable.  No biliary ductal dilation. Pancreas: No pancreatic ductal dilation or evidence of acute inflammation. Spleen: No splenomegaly.  Adrenals/Urinary Tract: Right kidney surgically absent. Progressive extensive soft tissue nodularity in the right retroperitoneum, hepatorenal fossa, right pericolic gutter and porta hepatis extending into the musculature/soft tissues overlying the right flank. Indexed lesions are as follows: -Indexed lesion in the hepatorenal fossa measures 5.4 x 3.4 cm on image 55/3 previously 5.0 x 3.6 cm. -Indexed lesion in the right flank which involves the twelfth rib measures 4.8 x 3.0 cm on image 61/3 previously 4.0 x 2.7 cm. -Indexed nodule in the inferior aspect of the nephrectomy bed measures 3.3 x 3.2 cm on image 73/3 previously 3.2 x 3.2 cm. -Indexed lesion in the inferior aspect of the pericolic gutter measures 4.5 x 2.3 cm on image 71/3 previously 4.0 x 2.6 cm when remeasured for consistency. -small soft tissue lesions in the right erector spinae musculature are unchanged measuring 1 cm and 9 mm on image 59/3 and 58/3 respectively. No suspicious left renal lesion.  Left adrenal gland appears normal. Stomach/Bowel: Stomach is unremarkable for degree of distension. No pathologic dilation of small or large bowel. No evidence of acute bowel inflammation. Vascular/Lymphatic: Increased burden of tumor nodularity or thrombus involving the IVC at the level of the right renal vein with increased burden of IVC thrombus versus mixing artifact of the IVC and extending down the right iliac veins. The portal, splenic and superior mesenteric veins are patent. Aortic atherosclerosis. No pathologically enlarged abdominal or pelvic lymph nodes confidently identified. Reproductive: No mass or other abnormality. Other: No significant abdominopelvic free fluid. Musculoskeletal: Multilevel degenerative change of the spine. No new aggressive lytic or blastic lesion of bone. IMPRESSION: Overall findings compatible with increased burden of disease. For instance: 1. Progressive extensive soft tissue nodularity in the right retroperitoneum,  hepatorenal fossa, right pericolic gutter and porta hepatis extending into the musculature/soft tissues overlying the right flank. 2. Increased burden of tumor nodularity or thrombus involving the IVC  at the level of the right renal vein with increased burden of IVC thrombus versus mixing artifact of the IVC and extending down the right iliac veins. 3. New pathologically enlarged prevascular lymph node measures 10 mm. 4. Bilobar hypodense hepatic lesions again seen some of which have increased in size and others are stable. No new suspicious hepatic lesion identified. 5. Unchanged 8 mm irregular nodule in the anterior left upper lobe. 6.  Aortic Atherosclerosis (ICD10-I70.0). Electronically Signed   By: Maudry Mayhew M.D.   On: 07/15/2023 11:00

## 2023-08-12 NOTE — Patient Instructions (Addendum)
[facility] at [facility] Discharge Instructions   You were seen and examined today by Dr. Ellin Saba.  He reviewed the results of your lab work which are normal/stable.   Continue Welireg as prescribed.   We will see you back in 4 weeks. We will repeat lab work at that time.   Return as scheduled.    Thank you for choosing [facility] at [facility] to provide your oncology and hematology care.  To afford each patient quality time with our provider, please arrive at least 15 minutes before your scheduled appointment time.   If you have a lab appointment with the Cancer Center please come in thru the Main Entrance and check in at the main information desk.  You need to re-schedule your appointment should you arrive 10 or more minutes late.  We strive to give you quality time with our providers, and arriving late affects you and other patients whose appointments are after yours.  Also, if you no show three or more times for appointments you may be dismissed from the clinic at the providers discretion.     Again, thank you for choosing [facility].  Our hope is that these requests will decrease the amount of time that you wait before being seen by our physicians.       _____________________________________________________________  Should you have questions after your visit to [facility], please contact our office at [phone] and follow the prompts.  Our office hours are 8:00 a.m. and 4:30 p.m. Monday - Friday.  Please note that voicemails left after 4:00 p.m. may not be returned until the following business day.  We are closed weekends and major holidays.  You do have access to a nurse 24-7, just call the main number to the clinic [phone] and do not press any options, hold on the line and a nurse will answer the phone.    For prescription refill requests, have your pharmacy contact our office and allow 72 hours.     Due to Covid, you will need to wear a mask upon entering the hospital. If you do not have a mask, a mask will be given to you at the Main Entrance upon arrival. For doctor visits, patients may have 1 support person age 62 or older with them. For treatment visits, patients can not have anyone with them due to social distancing guidelines and our immunocompromised population.

## 2023-08-12 NOTE — Progress Notes (Signed)
Patient is taking Belzutifan as prescribed.  She has not missed any doses and reports no side effects at this time other than being drained.

## 2023-08-12 NOTE — Progress Notes (Signed)
Patient is taking Welireg as prescribed. She has not missed any doses and reports no side effects at this time.

## 2023-08-14 ENCOUNTER — Ambulatory Visit: Payer: 59 | Admitting: Family Medicine

## 2023-08-14 VITALS — BP 130/66 | HR 74 | Ht 66.0 in | Wt 151.1 lb

## 2023-08-14 DIAGNOSIS — Z23 Encounter for immunization: Secondary | ICD-10-CM

## 2023-08-14 DIAGNOSIS — E890 Postprocedural hypothyroidism: Secondary | ICD-10-CM | POA: Diagnosis not present

## 2023-08-14 DIAGNOSIS — E782 Mixed hyperlipidemia: Secondary | ICD-10-CM

## 2023-08-14 DIAGNOSIS — C641 Malignant neoplasm of right kidney, except renal pelvis: Secondary | ICD-10-CM

## 2023-08-14 DIAGNOSIS — I10 Essential (primary) hypertension: Secondary | ICD-10-CM

## 2023-08-14 DIAGNOSIS — F320 Major depressive disorder, single episode, mild: Secondary | ICD-10-CM

## 2023-08-14 NOTE — Assessment & Plan Note (Signed)
Managed by Endo, challenging currenntly diue to oncology treatment

## 2023-08-14 NOTE — Assessment & Plan Note (Signed)
Resolved, off medication for over 6 months, benefiting from therapy

## 2023-08-14 NOTE — Assessment & Plan Note (Signed)
After obtaining informed consent, the vaccine is  administered , with no adverse effect noted at the time of administration.  

## 2023-08-14 NOTE — Progress Notes (Signed)
   Deanna Bush     MRN: [id number]      DOB: Apr 18, 1961  Chief Complaint  Patient presents with   Follow-up    Follow up    HPI Deanna Bush is here for follow up and re-evaluation of chronic medical conditions, medication management and review of any available recent lab and radiology data.  Preventive health is updated, specifically  Immunization.   Questions or concerns regarding consultations or procedures which the PT has had in the interim are  addressed.Unfortunately there is progression of disease burden of her cancer  Now on 2nd week of new treatment , which has caused excessive fatigue and will lower hb. Out of work now and will retire  Still dealing with pain  her spouse has caused , handling it as well as possible and has great support from her family Had 3 months of diarrhea on previous drug which had stopped working Thyroid out of sync but followed by endo Constipation a potential s/e of new med Severe pain and weakness over past 2 weeks/ could not get out of bed  ROS Denies recent fever or chills. Denies sinus pressure, nasal congestion, ear pain or sore throat. Denies chest congestion, productive cough or wheezing. Denies chest pains, palpitations and leg swelling Denies dysuria, frequency, hesitancy or incontinence. Denies skin break down or rash.   PE  BP 130/66 (BP Location: Right Arm, Patient Position: Sitting, Cuff Size: Normal)   Pulse 74   Ht 5\' 6"  (1.676 m)   Wt 151 lb 1.3 oz (68.5 kg)   SpO2 (!) 89%   BMI 24.38 kg/m   Patient alert and oriented and in no cardiopulmonary distress.  HEENT: No facial asymmetry, EOMI,     Neck supple .  Chest: Clear to auscultation bilaterally.  CVS: S1, S2 no murmurs, no S3.Regular rate.  ABD: Soft non tender.   Ext: No edema  MS: Adequate ROM spine, shoulders, hips and knees.  Skin: Intact, no ulcerations or rash noted.  Psych: Good eye contact, normal affect. Memory intact not anxious or depressed  appearing.  CNS: CN 2-12 intact, power,  normal throughout.no focal deficits noted.   Assessment & Plan  Essential hypertension, benign Controlled, no change in medication   Depression, major, single episode, mild (HCC) Resolved, off medication for over 6 months, benefiting from therapy  Hypothyroidism following radioiodine therapy Managed by Endo, challenging currenntly diue to oncology treatment  Renal cell cancer (HCC) Metastases intraperitoneal extensive, closely followed and monitored  by Oncology  Mixed hyperlipidemia Hyperlipidemia:Low fat diet discussed and encouraged.   Lipid Panel  Lab Results  Component Value Date   CHOL 233 (H) 07/21/2023   HDL 74 07/21/2023   LDLCALC 145 (H) 07/21/2023   TRIG 84 07/21/2023   CHOLHDL 3.1 07/21/2023     No med change  Encounter for immunization After obtaining informed consent, the vaccine is  administered , with no adverse effect noted at the time of administration.

## 2023-08-14 NOTE — Assessment & Plan Note (Signed)
Metastases intraperitoneal extensive, closely followed and monitored  by Oncology

## 2023-08-14 NOTE — Assessment & Plan Note (Signed)
Hyperlipidemia:Low fat diet discussed and encouraged.   Lipid Panel  Lab Results  Component Value Date   CHOL 233 (H) 07/21/2023   HDL 74 07/21/2023   LDLCALC 145 (H) 07/21/2023   TRIG 84 07/21/2023   CHOLHDL 3.1 07/21/2023     No med change

## 2023-08-14 NOTE — Assessment & Plan Note (Signed)
Controlled, no change in medication  

## 2023-08-14 NOTE — Patient Instructions (Signed)
F/U in 4.5 months, call if you need me sooner  Flu vaccine today  Continue medications as listed  Thanks for choosing [facility], we consider it a privelige to serve you.

## 2023-08-26 ENCOUNTER — Inpatient Hospital Stay: Payer: 59

## 2023-08-26 DIAGNOSIS — C641 Malignant neoplasm of right kidney, except renal pelvis: Secondary | ICD-10-CM | POA: Diagnosis not present

## 2023-08-26 DIAGNOSIS — C50912 Malignant neoplasm of unspecified site of left female breast: Secondary | ICD-10-CM

## 2023-08-26 LAB — CBC
HCT: 29.3 % — ABNORMAL LOW (ref 36.0–46.0)
Hemoglobin: 9.2 g/dL — ABNORMAL LOW (ref 12.0–15.0)
MCH: 29.7 pg (ref 26.0–34.0)
MCHC: 31.4 g/dL (ref 30.0–36.0)
MCV: 94.5 fL (ref 80.0–100.0)
Platelets: 343 10*3/uL (ref 150–400)
RBC: 3.1 MIL/uL — ABNORMAL LOW (ref 3.87–5.11)
RDW: 13.7 % (ref 11.5–15.5)
WBC: 6 10*3/uL (ref 4.0–10.5)
nRBC: 0 % (ref 0.0–0.2)

## 2023-08-26 LAB — SAMPLE TO BLOOD BANK

## 2023-08-26 LAB — IRON AND TIBC
Iron: 49 ug/dL (ref 28–170)
Saturation Ratios: 17 % (ref 10.4–31.8)
TIBC: 284 ug/dL (ref 250–450)
UIBC: 235 ug/dL

## 2023-08-26 LAB — FERRITIN: Ferritin: 272 ng/mL (ref 11–307)

## 2023-09-03 ENCOUNTER — Ambulatory Visit (INDEPENDENT_AMBULATORY_CARE_PROVIDER_SITE_OTHER): Payer: 59 | Admitting: Psychiatry

## 2023-09-03 DIAGNOSIS — F4323 Adjustment disorder with mixed anxiety and depressed mood: Secondary | ICD-10-CM

## 2023-09-03 NOTE — Progress Notes (Signed)
Virtual Visit via Video Note  I connected with AMARION MCGLOTHEN on 09/03/23 at 4:05 PM EST  by a video enabled telemedicine application and verified that I am speaking with the correct person using two identifiers.  Location: Patient: Home Provider: [facility] office    I discussed the limitations of evaluation and management by telemedicine and the availability of in person appointments. The patient expressed understanding and agreed to proceed.  I provided 19 minutes of non-face-to-face time during this encounter.   Adah Salvage, LCSW  IN-PERSON  THERAPIST PROGRESS NOTE  Session Time:  Wednesday 11/272024 4:05 PM - 4:24 PM        Participation Level: Active  Behavioral Response: CasualAlert/  Type of Therapy: Individual Therapy  Treatment Goals addressed: Have healthy grieving process around loss Begin verbalizing feelings associated with loss     ProgressTowards Goals: Progressing  Interventions: Supportive  Summary: FRANCI MELNYK is a 62 y.o. female who p is referred for services by PCP Dr. Lodema Hong due to pt experiencing symptoms of anxiety along with grief and loss issues. Pt denies any psychiatric hospitalizations. Pt has no previous involvement in outpatient therapy.  Per patient's report her stepdaughter was diagnosed with colon cancer at age 30 and died in November 02, 2022.  Patient was her caretaker and says patient died at her home.  Patient reports her husband left her about 2 weeks ago for 68 year old after 30 years of marriage.  Patient reports additional stress regarding providing care for her 9 year old mother.  Patient also has her own health issues as she was diagnosed with kidney cancer 2 years ago and had chemo.  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 She currently is taking experimental drugs.  Patient reports crying spells, sadness, irritability, worry, anxiety, and sleeping difficulty.   Patient last was seen about 6  weeks ago. She reports continued stress since last session. Per her report, she has started treatment for cancer and has been placed on medical leave for 6 months.  She reports taking the medication daily and reports side effects of self severe fatigue.  She also reports experiencing severe pain.  She has pain medication but tries to limit use as the medication makes her very sleepy.  She has talked with her children about her her health and reports strong support from them as well as other family members and friends.  She also reports continuing to use her spirituality and faith.  Patient reports having labs and seeing her oncologist every 2 weeks.  Patient states she has come to terms with her condition as she knows it is only so much her oncologist can do.  She reports crying a lot and knows this is to be expected.  She states she is still going to fight.  She reports continued anger with her husband but states she has come to terms with  her husband and his girlfriend having a baby.  Pt reports she is trying to stay involved in activities within her capability and reports it is an adjustment to not working.  Therapist and patient end session early as patient is not feeling well. Suicidal/Homicidal: Nowithout intent/plan  Therapist Response: Reviewed symptoms, discussed stressors, facilitated pt expressing thoughts and feelings, validated feelings, encouraged pt to continue using support system and her spirituality, assisted patient identify how to cope with upcoming holiday as this is the first Thanksgiving since her separation from her husband  Plan: Return again  in 2 weeks.  Diagnosis: Adjustment disorder with mixed anxiety and depressed mood  Collaboration of Care: Primary Care Provider AEB patient is seeing PCP Dr. [name] for medication management  Patient/Guardian was advised Release of Information must be obtained prior to any record release in order to collaborate their care with an  outside provider. Patient/Guardian was advised if they have not already done so to contact the registration department to sign all necessary forms in order for [place] to release information regarding their care.   Consent: Patient/Guardian gives verbal consent for treatment and assignment of benefits for services provided during this visit. Patient/Guardian expressed understanding and agreed to proceed.   Adah Salvage, LCSW 09/03/2023

## 2023-09-09 ENCOUNTER — Inpatient Hospital Stay: Payer: 59

## 2023-09-09 ENCOUNTER — Inpatient Hospital Stay: Payer: 59 | Attending: Hematology | Admitting: Hematology

## 2023-09-09 VITALS — BP 118/54 | HR 99 | Temp 97.3°F | Resp 18 | Wt 162.5 lb

## 2023-09-09 DIAGNOSIS — C50912 Malignant neoplasm of unspecified site of left female breast: Secondary | ICD-10-CM

## 2023-09-09 DIAGNOSIS — N189 Chronic kidney disease, unspecified: Secondary | ICD-10-CM | POA: Diagnosis not present

## 2023-09-09 DIAGNOSIS — J90 Pleural effusion, not elsewhere classified: Secondary | ICD-10-CM | POA: Diagnosis not present

## 2023-09-09 DIAGNOSIS — Z87891 Personal history of nicotine dependence: Secondary | ICD-10-CM | POA: Diagnosis not present

## 2023-09-09 DIAGNOSIS — R748 Abnormal levels of other serum enzymes: Secondary | ICD-10-CM | POA: Diagnosis not present

## 2023-09-09 DIAGNOSIS — C641 Malignant neoplasm of right kidney, except renal pelvis: Secondary | ICD-10-CM | POA: Insufficient documentation

## 2023-09-09 DIAGNOSIS — D631 Anemia in chronic kidney disease: Secondary | ICD-10-CM | POA: Diagnosis not present

## 2023-09-09 DIAGNOSIS — Z8042 Family history of malignant neoplasm of prostate: Secondary | ICD-10-CM | POA: Diagnosis not present

## 2023-09-09 DIAGNOSIS — C786 Secondary malignant neoplasm of retroperitoneum and peritoneum: Secondary | ICD-10-CM | POA: Insufficient documentation

## 2023-09-09 DIAGNOSIS — Z905 Acquired absence of kidney: Secondary | ICD-10-CM | POA: Diagnosis not present

## 2023-09-09 DIAGNOSIS — Z800 Family history of malignant neoplasm of digestive organs: Secondary | ICD-10-CM | POA: Insufficient documentation

## 2023-09-09 DIAGNOSIS — R918 Other nonspecific abnormal finding of lung field: Secondary | ICD-10-CM | POA: Insufficient documentation

## 2023-09-09 DIAGNOSIS — Z8051 Family history of malignant neoplasm of kidney: Secondary | ICD-10-CM | POA: Diagnosis not present

## 2023-09-09 DIAGNOSIS — M545 Low back pain, unspecified: Secondary | ICD-10-CM | POA: Diagnosis not present

## 2023-09-09 DIAGNOSIS — Z79899 Other long term (current) drug therapy: Secondary | ICD-10-CM | POA: Insufficient documentation

## 2023-09-09 DIAGNOSIS — Z803 Family history of malignant neoplasm of breast: Secondary | ICD-10-CM | POA: Diagnosis not present

## 2023-09-09 DIAGNOSIS — I129 Hypertensive chronic kidney disease with stage 1 through stage 4 chronic kidney disease, or unspecified chronic kidney disease: Secondary | ICD-10-CM | POA: Insufficient documentation

## 2023-09-09 DIAGNOSIS — Z853 Personal history of malignant neoplasm of breast: Secondary | ICD-10-CM | POA: Insufficient documentation

## 2023-09-09 DIAGNOSIS — R609 Edema, unspecified: Secondary | ICD-10-CM | POA: Diagnosis not present

## 2023-09-09 DIAGNOSIS — D649 Anemia, unspecified: Secondary | ICD-10-CM | POA: Diagnosis not present

## 2023-09-09 DIAGNOSIS — I3139 Other pericardial effusion (noninflammatory): Secondary | ICD-10-CM | POA: Diagnosis not present

## 2023-09-09 DIAGNOSIS — Z801 Family history of malignant neoplasm of trachea, bronchus and lung: Secondary | ICD-10-CM | POA: Insufficient documentation

## 2023-09-09 DIAGNOSIS — E039 Hypothyroidism, unspecified: Secondary | ICD-10-CM | POA: Insufficient documentation

## 2023-09-09 DIAGNOSIS — C787 Secondary malignant neoplasm of liver and intrahepatic bile duct: Secondary | ICD-10-CM | POA: Diagnosis not present

## 2023-09-09 DIAGNOSIS — K5900 Constipation, unspecified: Secondary | ICD-10-CM | POA: Diagnosis not present

## 2023-09-09 DIAGNOSIS — Z9012 Acquired absence of left breast and nipple: Secondary | ICD-10-CM | POA: Diagnosis not present

## 2023-09-09 LAB — CBC WITH DIFFERENTIAL/PLATELET
Abs Immature Granulocytes: 0.01 10*3/uL (ref 0.00–0.07)
Basophils Absolute: 0 10*3/uL (ref 0.0–0.1)
Basophils Relative: 0 %
Eosinophils Absolute: 0 10*3/uL (ref 0.0–0.5)
Eosinophils Relative: 0 %
HCT: 26.9 % — ABNORMAL LOW (ref 36.0–46.0)
Hemoglobin: 8.2 g/dL — ABNORMAL LOW (ref 12.0–15.0)
Immature Granulocytes: 0 %
Lymphocytes Relative: 12 %
Lymphs Abs: 0.8 10*3/uL (ref 0.7–4.0)
MCH: 29.2 pg (ref 26.0–34.0)
MCHC: 30.5 g/dL (ref 30.0–36.0)
MCV: 95.7 fL (ref 80.0–100.0)
Monocytes Absolute: 0.6 10*3/uL (ref 0.1–1.0)
Monocytes Relative: 9 %
Neutro Abs: 5.5 10*3/uL (ref 1.7–7.7)
Neutrophils Relative %: 79 %
Platelets: 279 10*3/uL (ref 150–400)
RBC: 2.81 MIL/uL — ABNORMAL LOW (ref 3.87–5.11)
RDW: 14.4 % (ref 11.5–15.5)
WBC: 7 10*3/uL (ref 4.0–10.5)
nRBC: 0 % (ref 0.0–0.2)

## 2023-09-09 LAB — COMPREHENSIVE METABOLIC PANEL
ALT: 34 U/L (ref 0–44)
AST: 63 U/L — ABNORMAL HIGH (ref 15–41)
Albumin: 3.4 g/dL — ABNORMAL LOW (ref 3.5–5.0)
Alkaline Phosphatase: 177 U/L — ABNORMAL HIGH (ref 38–126)
Anion gap: 13 (ref 5–15)
BUN: 29 mg/dL — ABNORMAL HIGH (ref 8–23)
CO2: 25 mmol/L (ref 22–32)
Calcium: 9.6 mg/dL (ref 8.9–10.3)
Chloride: 95 mmol/L — ABNORMAL LOW (ref 98–111)
Creatinine, Ser: 1.46 mg/dL — ABNORMAL HIGH (ref 0.44–1.00)
GFR, Estimated: 40 mL/min — ABNORMAL LOW (ref >60)
Glucose, Bld: 113 mg/dL — ABNORMAL HIGH (ref 70–99)
Potassium: 4.4 mmol/L (ref 3.5–5.1)
Sodium: 133 mmol/L — ABNORMAL LOW (ref 135–145)
Total Bilirubin: 0.3 mg/dL (ref <1.2)
Total Protein: 7.4 g/dL (ref 6.5–8.1)

## 2023-09-09 LAB — PREPARE RBC (CROSSMATCH)

## 2023-09-09 LAB — MAGNESIUM: Magnesium: 1.9 mg/dL (ref 1.7–2.4)

## 2023-09-09 NOTE — Progress Notes (Signed)
[facility] [address] [place] [address]    Clinic Day:  09/09/23   Referring physician: Kerri Perches, MD  Patient Care Team: Kerri Perches, MD as PCP - General Mallipeddi, Orion Modest, MD as PCP - Cardiology (Cardiology) Doreatha Massed, MD as Medical Oncologist (Medical Oncology)   ASSESSMENT & PLAN:   Assessment: 1. Metastatic clear-cell renal cell carcinoma: - Right radical nephrectomy on 02/06/2021. - Pathology shows clear-cell RCC, grade 4, 12.5 cm, necrosis and rhabdoid features are identified.  Tumor extends into the and invades the wall of the vena cava (pT3c).  Vascular, ureteral and all margins of resection are negative for tumor. - CT scan abdomen with and without contrast on 05/29/2021 showed several nodules along the peritoneal surface of the right nephrectomy bed warranting close attention. - CTAP with and without contrast on 09/18/2021 showed progressive nodularity within the right nephrectomy bed, with enlarged nodules adjacent to the right hepatic lobe extending inferiorly along the right retroperitoneum with multiple enlarged enhancing nodules.  Direct metastatic invasion into the right hepatic lobe.  Small nodule at the right lung base favored to be benign. - IMDC: Intermediate risk group with 2 features including diagnosis less than 1 year and hemoglobin lower than normal. - Bone scan on 10/05/2021 with focal activity in the mental vertex of the mandible which could be inflammatory/traumatic/metastatic. - CT chest on 10/03/2021 showed several lung nodules scattered bilaterally some of which could be metastatic and many others could be due to mucoid impaction. - NGS testing did not show any targetable mutations.  TMB was low.  CCND3 amplified.  PBRM1 VUS present. - Right nephrectomy bed biopsy (10/22/2021): Clear-cell renal cell carcinoma with necrosis - 4 cycles of ipilimumab and nivolumab from 11/19/2021 through 01/24/2022 - CT CAP  on 02/14/2022: Numerous new and enlarged lung nodules.  Significant interval increase in the mass in the right renal bed.  New hypodense liver lesions. - Lenvatinib (12 mg) and everolimus 5 mg daily started on 03/13/2022, discontinued on 07/22/2023 due to progression. - Belzutifan 120 mg daily started on 07/29/2023   2. Social/family history: - She lives at home with her husband and also takes care of her mother. - She works as a Child psychotherapist at [employer].  She quit smoking in 2004 and smoked half pack per day for 10 years. - Half sister (same mother) had kidney cancer at age 40.  Maternal grandfather had kidney cancer.  Sister died of lung cancer.  2 half sisters (same father) had breast cancers.  3.  Left breast stage I tubular adenocarcinoma: - She underwent left mastectomy on 06/11/2002.  0/6 lymph nodes positive.  ER 90%, PR 92%, Ki-67 6%, HER2 negative.  As per chart, declined antiestrogen therapy.    Plan: 1. Metastatic clear-cell RCC, intermediate risk by IMDC: - She is tolerating belzutifan reasonably well. - Denies any further episodes of hematuria. - Her oxygen saturations are between 86-90%.  Today it is 87% in the office.  She does not report any dyspnea on exertion.  However her hemoglobin is 8.2.  Hence I have recommended 1 unit PRBC transfusion.  Creatinine today is 1.46.  LFTs show elevated alk phos and AST of 63.  Rest of LFTs are normal. - Continue belzutifan 120 mg daily.  RTC 4 weeks for follow-up.  Will obtain CT CAP with contrast prior to next visit.  2.  Normocytic anemia: - Normocytic anemia from chronic inflammation, CKD and belzutifan. -  Hemoglobin today is 8.2.  Will transfuse 1 unit PRBC.  3.  Hypothyroidism: - Continue Synthroid 125 mcg daily.   4.  Hypertension: - Blood pressure today is 118/54.  Hold olmesartan/HCTZ.  5.  Constipation: - Continue stool softener daily as needed when she takes pain medication.  6.  Right loin pain: - She has a  right loin mass with occasional pain. - She is taking half tablet of hydrocodone daily as needed.  She has not required any in the last couple of days.    Orders Placed This Encounter  Procedures   CT CHEST ABDOMEN PELVIS W CONTRAST    Standing Status:   Future    Standing Expiration Date:   09/08/2024    Order Specific Question:   If indicated for the ordered procedure, I authorize the administration of contrast media per Radiology protocol    Answer:   Yes    Order Specific Question:   Does the patient have a contrast media/X-ray dye allergy?    Answer:   No    Order Specific Question:   Preferred imaging location?    Answer:   [facility]    Order Specific Question:   If indicated for the ordered procedure, I authorize the administration of oral contrast media per Radiology protocol    Answer:   Yes   Care order/instruction    Transfuse Parameters    Standing Status:   Future    Number of Occurrences:   1    Standing Expiration Date:   09/08/2024   Type and screen         Standing Status:   Future    Number of Occurrences:   1    Standing Expiration Date:   09/08/2024   Prepare RBC (crossmatch)    Standing Status:   Standing    Number of Occurrences:   1    Order Specific Question:   # of Units    Answer:   1 unit    Order Specific Question:   Transfusion Indications    Answer:   Other    Order Specific Question:   Comments    Answer:   symptomatic anemia    Order Specific Question:   Number of Units to Keep Ahead    Answer:   NO units ahead    Order Specific Question:   Instructions:    Answer:   Transfuse    Order Specific Question:   If emergent release call blood bank    Answer:   Not emergent release      I,Helena R Teague,acting as a scribe for Doreatha Massed, MD.,have documented all relevant documentation on the behalf of Doreatha Massed, MD,as directed by  Doreatha Massed, MD while in the presence of Doreatha Massed, MD.  I, Doreatha Massed MD, have reviewed the above documentation for accuracy and completeness, and I agree with the above.       Doreatha Massed, MD   12/3/20243:23 PM  CHIEF COMPLAINT:   Diagnosis: metastatic clear-cell renal cell carcinoma    Cancer Staging  Malignant neoplasm of female breast [facility]) Staging form: Breast, AJCC 6th Edition - Clinical: Stage I (T1b, N0, M0) - Signed by Ellouise Newer, PA on 08/16/2011  Renal cell cancer [facility]) Staging form: Kidney, AJCC 8th Edition - Clinical stage from 09/26/2021: Stage IV (cT3c, cNX, cM1) - Unsigned    Prior Therapy: Nivolumab plus ipilimumab for 4 cycles with progression, lenvatinib and everolimus  Current Therapy:  Belzutifan   HISTORY OF PRESENT ILLNESS:   Oncology History  Renal cell cancer (HCC)  06/20/2021 Initial Diagnosis   Renal cell carcinoma of right kidney (HCC)   11/19/2021 - 01/24/2022 Chemotherapy   Patient is on Treatment Plan : RENAL CELL CARCINOMA Nivolumab + Ipilimumab q21d / Nivolumab q28d      Genetic Testing   Negative genetic testing. No pathogenic variants identified on the [facility] Multi-Cancer Panel + RNA. VUS in PMS2 called c.1732C>A and in RECQL4 called c.2967G>A  Identified. The report date is 11/01/2021.   The Multi-Cancer Panel + RNA offered by [facility] includes sequencing and/or deletion duplication testing of the following 84 genes: AIP, ALK, APC, ATM, AXIN2,BAP1,  BARD1, BLM, BMPR1A, BRCA1, BRCA2, BRIP1, CASR, CDC73, CDH1, CDK4, CDKN1B, CDKN1C, CDKN2A (p14ARF), CDKN2A (p16INK4a), CEBPA, CHEK2, CTNNA1, DICER1, DIS3L2, EGFR (c.2369C>T, p.Thr790Met variant only), EPCAM (Deletion/duplication testing only), FH, FLCN, GATA2, GPC3, GREM1 (Promoter region deletion/duplication testing only), HOXB13 (c.251G>A, p.Gly84Glu), HRAS, KIT, MAX, MEN1, MET, MITF (c.952G>A, p.Glu318Lys variant only), MLH1, MSH2, MSH3, MSH6, MUTYH, NBN, NF1, NF2, NTHL1, PALB2, PDGFRA, PHOX2B, PMS2, POLD1, POLE, POT1, PRKAR1A, PTCH1, PTEN,  RAD50, RAD51C, RAD51D, RB1, RECQL4, RET, RUNX1, SDHAF2, SDHA (sequence changes only), SDHB, SDHC, SDHD, SMAD4, SMARCA4, SMARCB1, SMARCE1, STK11, SUFU, TERC, TERT, TMEM127, TP53, TSC1, TSC2, VHL, WRN and WT1.       INTERVAL HISTORY:   Deanna Bush is a 62 y.o. female presenting to clinic today for follow up of metastatic clear-cell renal cell carcinoma. She was last seen by me on 08/12/23.  Today, she states that she is doing well overall. Her appetite level is at 100%. Her energy level is at 70%. She is accompanied by a family member.  She reports SOB and states her oxygen saturation stays around 86-90 when at home. Her right loin pain has improved and has not taken any pain medication since 09/07/23. Prior to this she was taking 0.5 tablets a day of hydrocodone 5/325 mg. Her constipation has improved and she has been eating more greens in her diet. She has not taken stool softener for a few days. She did not take any blood pressure medication for 2 days, but took them as prescribed yesterday and today.   She reports last week she had decreased energy and bilateral leg weakness. Her symptoms have improved dramatically since 09/07/23.    PAST MEDICAL HISTORY:   Past Medical History: Past Medical History:  Diagnosis Date   Adenocarcinoma of breast (HCC)    left    Cellulitis of leg, left    Complication of anesthesia    Hard to wake up   Depression    Diabetes mellitus without complication (HCC)    Pt denies   Family history of breast cancer 08/16/2011   Family history of breast cancer    Family history of colon cancer    Family history of kidney cancer    Family history of prostate cancer    Hyperlipidemia    Hypertension    Hypothyroidism    MRSA (methicillin resistant staph aureus) culture positive 08/19/2011   Pre-diabetes     Surgical History: Past Surgical History:  Procedure Laterality Date   BREAST SURGERY Left    mastectomy   CATARACT EXTRACTION W/PHACO Right 06/06/2023    Procedure: CATARACT EXTRACTION PHACO AND INTRAOCULAR LENS PLACEMENT (IOC);  Surgeon: Fabio Pierce, MD;  Location: [facility];  Service: Ophthalmology;  Laterality: Right;  CDE 5.61   CATARACT EXTRACTION W/PHACO Left 07/21/2023   Procedure: CATARACT EXTRACTION PHACO AND INTRAOCULAR  LENS PLACEMENT (IOC);  Surgeon: Fabio Pierce, MD;  Location: [facility];  Service: Ophthalmology;  Laterality: Left;  CDE: 5.96   CESAREAN SECTION     x2   COLONOSCOPY N/A 03/03/2020   Procedure: COLONOSCOPY;  Surgeon: Corbin Ade, MD;  Location: [facility];  Service: Endoscopy;  Laterality: N/A;  12:00   IR IMAGING GUIDED PORT INSERTION  11/07/2021   left mastectomy     NEPHRECTOMY Right 02/06/2021   Procedure: NEPHRECTOMY- open radical;  Surgeon: Malen Gauze, MD;  Location: [facility];  Service: Urology;  Laterality: Right;   POLYPECTOMY  03/03/2020   Procedure: POLYPECTOMY;  Surgeon: Corbin Ade, MD;  Location: [facility];  Service: Endoscopy;;    Social History: Social History   Socioeconomic History   Marital status: Married    Spouse name: Not on file   Number of children: 2   Years of education: Not on file   Highest education level: GED or equivalent  Occupational History   Occupation: CNA  Tobacco Use   Smoking status: Former    Current packs/day: 0.00    Average packs/day: 0.5 packs/day for 18.0 years (9.0 ttl pk-yrs)    Types: Cigarettes    Start date: 07/01/1984    Quit date: 07/01/2002    Years since quitting: 21.2   Smokeless tobacco: Never  Vaping Use   Vaping status: Never Used  Substance and Sexual Activity   Alcohol use: No   Drug use: No   Sexual activity: Yes    Birth control/protection: None  Other Topics Concern   Not on file  Social History Narrative   Not on file   Social Determinants of Health   Financial Resource Strain: Medium Risk (08/14/2023)   Overall Financial Resource Strain (CARDIA)    Difficulty of Paying Living Expenses: Somewhat hard  Food  Insecurity: No Food Insecurity (08/14/2023)   Hunger Vital Sign    Worried About Running Out of Food in the Last Year: Never true    Ran Out of Food in the Last Year: Never true  Transportation Needs: No Transportation Needs (08/14/2023)   PRAPARE - Administrator, Civil Service (Medical): No    Lack of Transportation (Non-Medical): No  Physical Activity: Insufficiently Active (08/14/2023)   Exercise Vital Sign    Days of Exercise per Week: 1 day    Minutes of Exercise per Session: 10 min  Stress: No Stress Concern Present (08/14/2023)   [employer] of Occupational Health - Occupational Stress Questionnaire    Feeling of Stress : Only a little  Social Connections: Moderately Integrated (08/14/2023)   Social Connection and Isolation Panel [NHANES]    Frequency of Communication with Friends and Family: Twice a week    Frequency of Social Gatherings with Friends and Family: More than three times a week    Attends Religious Services: More than 4 times per year    Active Member of [employer] or Organizations: Yes    Attends Engineer, structural: More than 4 times per year    Marital Status: Separated  Intimate Partner Violence: Not on file    Family History: Family History  Problem Relation Age of Onset   Alcohol abuse Mother    Hypertension Mother    Diabetes Mother    Hyperlipidemia Mother    Colon cancer Maternal Aunt        dx 37s   Prostate cancer Maternal Uncle    Throat cancer Maternal Uncle  Kidney cancer Maternal Grandfather    Lung cancer Half-Sister    Kidney cancer Half-Sister 90   Breast cancer Half-Sister 61   Breast cancer Half-Sister 25    Current Medications:  Current Outpatient Medications:    acetaminophen (TYLENOL) 500 MG tablet, Take 500 mg by mouth every 6 (six) hours as needed for moderate pain or headache., Disp: , Rfl:    atorvastatin (LIPITOR) 40 MG tablet, Take 1 tablet (40 mg total) by mouth daily., Disp: 90 tablet, Rfl: 1    belzutifan (WELIREG) 40 MG tablet, Take 3 tablets (120 mg total) by mouth daily., Disp: 90 tablet, Rfl: 1   benzonatate (TESSALON) 100 MG capsule, Take 1 capsule (100 mg total) by mouth 2 (two) times daily as needed for cough., Disp: 20 capsule, Rfl: 0   EPINEPHrine 0.3 mg/0.3 mL IJ SOAJ injection, Inject 0.3 mg into the muscle as needed for anaphylaxis., Disp: 2 each, Rfl: 0   HYDROcodone-acetaminophen (NORCO) 5-325 MG tablet, Take 1 tablet by mouth every 6 (six) hours as needed for moderate pain (pain score 4-6)., Disp: 30 tablet, Rfl: 0   levothyroxine (SYNTHROID) 125 MCG tablet, Take 1 tablet (125 mcg total) by mouth daily before breakfast., Disp: 90 tablet, Rfl: 1   Multiple Vitamin (MULTIVITAMIN WITH MINERALS) TABS tablet, Take 1 tablet by mouth daily., Disp: , Rfl:    olmesartan-hydrochlorothiazide (BENICAR HCT) 40-25 MG tablet, TAKE 1 TABLET BY MOUTH DAILY, Disp: 30 tablet, Rfl: 1   UNABLE TO FIND, MASTECTOMY BRA AND PROTHESIS DX Z90.12, Disp: 6 each, Rfl: 2   UNABLE TO FIND, 6 mastectomy prosthesis and bras., Disp: 6 each, Rfl: 0   urea (CARMOL) 10 % cream, Apply topically 2 (two) times daily. Apply twice daily to hands, Disp: 71 g, Rfl: 0 No current facility-administered medications for this visit.  Facility-Administered Medications Ordered in Other Visits:    heparin lock flush 100 unit/mL, 500 Units, Intravenous, Once, Doreatha Massed, MD   Allergies: Allergies  Allergen Reactions   Amlodipine Swelling    Leg edema   Sulfonamide Derivatives Itching   Yellow Jacket Venom [Bee Venom] Rash    urticaria    REVIEW OF SYSTEMS:   Review of Systems  Constitutional:  Negative for chills, fatigue and fever.  HENT:   Negative for lump/mass, mouth sores, nosebleeds, sore throat and trouble swallowing.   Eyes:  Negative for eye problems.  Respiratory:  Positive for shortness of breath. Negative for cough.   Cardiovascular:  Negative for chest pain, leg swelling and palpitations.   Gastrointestinal:  Negative for abdominal pain, constipation, diarrhea, nausea and vomiting.  Genitourinary:  Negative for bladder incontinence, difficulty urinating, dysuria, frequency, hematuria and nocturia.   Musculoskeletal:  Positive for back pain (right loin area, 2/10 severity). Negative for arthralgias, flank pain, myalgias and neck pain.  Skin:  Negative for itching and rash.  Neurological:  Negative for dizziness, headaches and numbness.  Hematological:  Does not bruise/bleed easily.  Psychiatric/Behavioral:  Negative for depression, sleep disturbance and suicidal ideas. The patient is not nervous/anxious.   All other systems reviewed and are negative.    VITALS:   Blood pressure (!) 118/54, pulse 99, temperature (!) 97.3 F (36.3 C), temperature source Oral, resp. rate 18, weight 162 lb 7.7 oz (73.7 kg), SpO2 (!) 87%.  Wt Readings from Last 3 Encounters:  09/09/23 162 lb 7.7 oz (73.7 kg)  08/14/23 151 lb 1.3 oz (68.5 kg)  08/12/23 149 lb 9.6 oz (67.9 kg)    Body  mass index is 26.22 kg/m.  Performance status (ECOG): 0 - Asymptomatic  PHYSICAL EXAM:   Physical Exam Vitals and nursing note reviewed. Exam conducted with a chaperone present.  Constitutional:      Appearance: Normal appearance.  Cardiovascular:     Rate and Rhythm: Normal rate and regular rhythm.     Pulses: Normal pulses.     Heart sounds: Normal heart sounds.  Pulmonary:     Effort: Pulmonary effort is normal.     Breath sounds: Normal breath sounds.  Abdominal:     Palpations: Abdomen is soft. There is no hepatomegaly, splenomegaly or mass.     Tenderness: There is no abdominal tenderness.  Musculoskeletal:     Right lower leg: No edema.     Left lower leg: No edema.     Comments: +swelling in right loin area  Lymphadenopathy:     Cervical: No cervical adenopathy.     Right cervical: No superficial, deep or posterior cervical adenopathy.    Left cervical: No superficial, deep or posterior  cervical adenopathy.     Upper Body:     Right upper body: No supraclavicular or axillary adenopathy.     Left upper body: No supraclavicular or axillary adenopathy.  Neurological:     General: No focal deficit present.     Mental Status: She is alert and oriented to person, place, and time.  Psychiatric:        Mood and Affect: Mood normal.        Behavior: Behavior normal.     LABS:      Latest Ref Rng & Units 09/09/2023   11:53 AM 08/26/2023    9:35 AM 08/12/2023   10:04 AM  CBC  WBC 4.0 - 10.5 K/uL 7.0  6.0  6.9   Hemoglobin 12.0 - 15.0 g/dL 8.2  9.2  16.1   Hematocrit 36.0 - 46.0 % 26.9  29.3  32.7   Platelets 150 - 400 K/uL 279  343  504       Latest Ref Rng & Units 09/09/2023   11:53 AM 08/12/2023   10:04 AM 08/07/2023    9:10 AM  CMP  Glucose 70 - 99 mg/dL 096  045  409   BUN 8 - 23 mg/dL 29  19  22    Creatinine 0.44 - 1.00 mg/dL 8.11  9.14  7.82   Sodium 135 - 145 mmol/L 133  131  132   Potassium 3.5 - 5.1 mmol/L 4.4  4.5  4.5   Chloride 98 - 111 mmol/L 95  92  93   CO2 22 - 32 mmol/L 25  28  29    Calcium 8.9 - 10.3 mg/dL 9.6  9.5  95.6   Total Protein 6.5 - 8.1 g/dL 7.4  7.2  7.4   Total Bilirubin <1.2 mg/dL 0.3  0.3  0.3   Alkaline Phos 38 - 126 U/L 177  108  110   AST 15 - 41 U/L 63  31  25   ALT 0 - 44 U/L 34  21  18      No results found for: "CEA1", "CEA" / No results found for: "CEA1", "CEA" No results found for: "PSA1" No results found for: "OZH086" No results found for: "CAN125"  No results found for: "TOTALPROTELP", "ALBUMINELP", "A1GS", "A2GS", "BETS", "BETA2SER", "GAMS", "MSPIKE", "SPEI" Lab Results  Component Value Date   TIBC 284 08/26/2023   TIBC 308 07/15/2023   TIBC 307 04/29/2023   FERRITIN  272 08/26/2023   FERRITIN 180 07/15/2023   FERRITIN 152 04/29/2023   IRONPCTSAT 17 08/26/2023   IRONPCTSAT 21 07/15/2023   IRONPCTSAT 20 04/29/2023   Lab Results  Component Value Date   LDH 178 07/15/2023   LDH 178 02/21/2022   LDH 152  09/27/2021     STUDIES:   No results found.

## 2023-09-09 NOTE — Patient Instructions (Addendum)
[facility] at [facility] Discharge Instructions   You were seen and examined today by Dr. Ellin Saba.  He reviewed the results of your lab work. Your hemoglobin has dropped to 8.2. Your oxygen saturation is also low at 87%. This is coming from the [facility] medication. Dr. Kirtland Bouchard would like for you to receive a blood transfusion.   Continue Welireg as prescribed.   We will see you back in 4 weeks. We will repeat lab work and CT scan prior to this visit.   Return as scheduled.   Thank you for choosing [facility] at [facility] to provide your oncology and hematology care.  To afford each patient quality time with our provider, please arrive at least 15 minutes before your scheduled appointment time.   If you have a lab appointment with the Cancer Center please come in thru the Main Entrance and check in at the main information desk.  You need to re-schedule your appointment should you arrive 10 or more minutes late.  We strive to give you quality time with our providers, and arriving late affects you and other patients whose appointments are after yours.  Also, if you no show three or more times for appointments you may be dismissed from the clinic at the providers discretion.     Again, thank you for choosing [facility].  Our hope is that these requests will decrease the amount of time that you wait before being seen by our physicians.       _____________________________________________________________  Should you have questions after your visit to [facility], please contact our office at [phone] and follow the prompts.  Our office hours are 8:00 a.m. and 4:30 p.m. Monday - Friday.  Please note that voicemails left after 4:00 p.m. may not be returned until the following business day.  We are closed weekends and major holidays.  You do have access to a nurse 24-7, just call the main number to the clinic [phone] and  do not press any options, hold on the line and a nurse will answer the phone.    For prescription refill requests, have your pharmacy contact our office and allow 72 hours.    Due to Covid, you will need to wear a mask upon entering the hospital. If you do not have a mask, a mask will be given to you at the Main Entrance upon arrival. For doctor visits, patients may have 1 support person age 27 or older with them. For treatment visits, patients can not have anyone with them due to social distancing guidelines and our immunocompromised population.

## 2023-09-10 ENCOUNTER — Inpatient Hospital Stay: Payer: 59

## 2023-09-10 DIAGNOSIS — C641 Malignant neoplasm of right kidney, except renal pelvis: Secondary | ICD-10-CM | POA: Diagnosis not present

## 2023-09-10 DIAGNOSIS — D649 Anemia, unspecified: Secondary | ICD-10-CM

## 2023-09-10 MED ORDER — SODIUM CHLORIDE 0.9% IV SOLUTION
250.0000 mL | INTRAVENOUS | Status: DC
  Administered 2023-09-10: 100 mL via INTRAVENOUS

## 2023-09-10 MED ORDER — ACETAMINOPHEN 325 MG PO TABS
650.0000 mg | ORAL_TABLET | Freq: Once | ORAL | Status: AC
  Administered 2023-09-10: 650 mg via ORAL
  Filled 2023-09-10: qty 2

## 2023-09-10 MED ORDER — DIPHENHYDRAMINE HCL 25 MG PO CAPS
25.0000 mg | ORAL_CAPSULE | Freq: Once | ORAL | Status: AC
  Administered 2023-09-10: 25 mg via ORAL
  Filled 2023-09-10: qty 1

## 2023-09-10 NOTE — Progress Notes (Signed)
One unit of blood given per orders. Patient tolerated it well without problems. Vitals stable and discharged home from clinic ambulatory. Follow up as scheduled.  

## 2023-09-10 NOTE — Patient Instructions (Signed)
[facility] [place] - A DEPT OF [facility]  Discharge Instructions: Thank you for choosing [facility] to provide your oncology and hematology care.  If you have a lab appointment with the Cancer Center - please note that after April 8th, 2024, all labs will be drawn in the cancer center.  You do not have to check in or register with the main entrance as you have in the past but will complete your check-in in the cancer center.  Wear comfortable clothing and clothing appropriate for easy access to any Portacath or PICC line.   We strive to give you quality time with your provider. You may need to reschedule your appointment if you arrive late (15 or more minutes).  Arriving late affects you and other patients whose appointments are after yours.  Also, if you miss three or more appointments without notifying the office, you may be dismissed from the clinic at the provider's discretion.      For prescription refill requests, have your pharmacy contact our office and allow 72 hours for refills to be completed.    Today you received one unit of blood per orders.   To help prevent nausea and vomiting after your treatment, we encourage you to take your nausea medication as directed.  BELOW ARE SYMPTOMS THAT SHOULD BE REPORTED IMMEDIATELY: *FEVER GREATER THAN 100.4 F (38 C) OR HIGHER *CHILLS OR SWEATING *NAUSEA AND VOMITING THAT IS NOT CONTROLLED WITH YOUR NAUSEA MEDICATION *UNUSUAL SHORTNESS OF BREATH *UNUSUAL BRUISING OR BLEEDING *URINARY PROBLEMS (pain or burning when urinating, or frequent urination) *BOWEL PROBLEMS (unusual diarrhea, constipation, pain near the anus) TENDERNESS IN MOUTH AND THROAT WITH OR WITHOUT PRESENCE OF ULCERS (sore throat, sores in mouth, or a toothache) UNUSUAL RASH, SWELLING OR PAIN  UNUSUAL VAGINAL DISCHARGE OR ITCHING   Items with * indicate a potential emergency and should be followed up as soon as possible or go to the  Emergency Department if any problems should occur.  Please show the CHEMOTHERAPY ALERT CARD or IMMUNOTHERAPY ALERT CARD at check-in to the Emergency Department and triage nurse.  Should you have questions after your visit or need to cancel or reschedule your appointment, please contact [facility] [place] - A DEPT OF [facility] [phone]  and follow the prompts.  Office hours are 8:00 a.m. to 4:30 p.m. Monday - Friday. Please note that voicemails left after 4:00 p.m. may not be returned until the following business day.  We are closed weekends and major holidays. You have access to a nurse at all times for urgent questions. Please call the main number to the clinic [phone] and follow the prompts.  For any non-urgent questions, you may also contact your provider using MyChart. We now offer e-Visits for anyone 50 and older to request care online for non-urgent symptoms. For details visit [email or web address].   Also download the MyChart app! Go to the app store, search "MyChart", open the app, select [place], and log in with your MyChart username and password.

## 2023-09-11 LAB — TYPE AND SCREEN
ABO/RH(D): O POS
Antibody Screen: NEGATIVE
Unit division: 0

## 2023-09-11 LAB — BPAM RBC
Blood Product Expiration Date: 202412262359
ISSUE DATE / TIME: 202412041006
Unit Type and Rh: 5100

## 2023-09-16 ENCOUNTER — Other Ambulatory Visit: Payer: Self-pay | Admitting: *Deleted

## 2023-09-16 DIAGNOSIS — C641 Malignant neoplasm of right kidney, except renal pelvis: Secondary | ICD-10-CM

## 2023-09-16 DIAGNOSIS — D649 Anemia, unspecified: Secondary | ICD-10-CM

## 2023-09-17 ENCOUNTER — Other Ambulatory Visit: Payer: Self-pay | Admitting: *Deleted

## 2023-09-17 ENCOUNTER — Ambulatory Visit (INDEPENDENT_AMBULATORY_CARE_PROVIDER_SITE_OTHER): Payer: 59 | Admitting: Psychiatry

## 2023-09-17 DIAGNOSIS — F4323 Adjustment disorder with mixed anxiety and depressed mood: Secondary | ICD-10-CM

## 2023-09-17 NOTE — Progress Notes (Signed)
Virtual Visit via Telephone Note  I connected with Deanna Bush on 09/17/23 at 4:17 PM EST  by telephone and verified that I am speaking with the correct person using two identifiers.  Location: Patient: Home Provider: [facility] [place] office    I discussed the limitations, risks, security and privacy concerns of performing an evaluation and management service by telephone and the availability of in person appointments. I also discussed with the patient that there may be a patient responsible charge related to this service. The patient expressed understanding and agreed to proceed.   I provided 18 minutes of non-face-to-face time during this encounter.   Adah Salvage, LCSW IN-PERSON  THERAPIST PROGRESS NOTE  Session Time:  Wednesday 09/17/2023 4:17 PM -  4:35 PM     Participation Level: Active  Behavioral Response: CasualAlert/  Type of Therapy: Individual Therapy  Treatment Goals addressed: Have healthy grieving process around loss Begin verbalizing feelings associated with loss     ProgressTowards Goals: Progressing  Interventions: Supportive  Summary: Deanna Bush is a 62 y.o. female who p is referred for services by PCP Dr. Lodema Hong due to pt experiencing symptoms of anxiety along with grief and loss issues. Pt denies any psychiatric hospitalizations. Pt has no previous involvement in outpatient therapy.  Per patient's report her stepdaughter was diagnosed with colon cancer at age 107 and died in Oct 26, 2022.  Patient was her caretaker and says patient died at her home.  Patient reports her husband left her about 2 weeks ago for 20 year old after 30 years of marriage.  Patient reports additional stress regarding providing care for her 39 year old mother.  Patient also has her own health issues as she was diagnosed with kidney cancer 2 years ago and had chemo.  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 She currently is taking experimental  drugs.  Patient reports crying spells, sadness, irritability, worry, anxiety, and sleeping difficulty.   Patient last was seen about 2 weeks ago. She reports continued stress since last session. Per her report, she continues to receive treatment for cancer.  She has begun to experience increased side effects of severe fatigue and shortness of breath.  Per her report, she no longer is able to do any activities that require standing for a period of time.  She can go to the bathroom but reports not having the energy to take care of of ADLs like bathing.  However, she reports strong support from her sister who assist her with these activities.  She reports she has been enjoying watching Christmas movies and talks with her friends on the phone.  She also continues to report strong support from her sons and other family members as well as friends.  She states being calm and not being fearful.  She is scheduled for a CT scan on 09/29/23 and will meet with her doctor on 10/07/23 to discuss the results.  She reports coping well with Thanksgiving.  She acknowledged her thoughts and feelings regarding her husband and marital separation but then focused on being with her family as well as being thankful for the people that were at her home.  Therapist and patient end session early as patient is not feeling well. Suicidal/Homicidal: Nowithout intent/plan  Therapist Response: Reviewed symptoms, discussed stressors, facilitated pt expressing thoughts and feelings, validated feelings, encouraged pt to continue using support system and her spirituality, praised and reinforced patient's use of healthy coping strategies and statements to  cope with the Thanksgiving holiday, assisted patient identify how to cope with upcoming holiday as this is the first Christmas since her separation from her husband  Plan: Return again in 2 weeks.  Diagnosis: Adjustment disorder with mixed anxiety and depressed mood  Collaboration of Care:  Primary Care Provider AEB patient is seeing PCP Dr. [name] for medication management  Patient/Guardian was advised Release of Information must be obtained prior to any record release in order to collaborate their care with an outside provider. Patient/Guardian was advised if they have not already done so to contact the registration department to sign all necessary forms in order for [place] to release information regarding their care.   Consent: Patient/Guardian gives verbal consent for treatment and assignment of benefits for services provided during this visit. Patient/Guardian expressed understanding and agreed to proceed.   Adah Salvage, LCSW12/08/2023

## 2023-09-23 ENCOUNTER — Other Ambulatory Visit: Payer: Self-pay | Admitting: Hematology

## 2023-09-23 ENCOUNTER — Other Ambulatory Visit: Payer: Self-pay | Admitting: *Deleted

## 2023-09-23 DIAGNOSIS — C641 Malignant neoplasm of right kidney, except renal pelvis: Secondary | ICD-10-CM

## 2023-09-24 ENCOUNTER — Other Ambulatory Visit: Payer: Self-pay | Admitting: *Deleted

## 2023-09-24 DIAGNOSIS — C641 Malignant neoplasm of right kidney, except renal pelvis: Secondary | ICD-10-CM

## 2023-09-24 MED ORDER — BELZUTIFAN 40 MG PO TABS: 120.0000 mg | ORAL_TABLET | Freq: Every day | ORAL | 1 refills | Status: DC

## 2023-09-29 ENCOUNTER — Encounter: Payer: Self-pay | Admitting: *Deleted

## 2023-09-29 ENCOUNTER — Inpatient Hospital Stay: Payer: 59

## 2023-09-29 ENCOUNTER — Other Ambulatory Visit: Payer: Self-pay | Admitting: *Deleted

## 2023-09-29 ENCOUNTER — Ambulatory Visit (HOSPITAL_COMMUNITY)
Admission: RE | Admit: 2023-09-29 | Discharge: 2023-09-29 | Disposition: A | Discharge status: Home / Self Care | Payer: 59 | Source: Ambulatory Visit | Attending: Hematology | Admitting: Hematology

## 2023-09-29 VITALS — BP 132/59 | HR 94 | Temp 98.2°F | Resp 18

## 2023-09-29 DIAGNOSIS — C641 Malignant neoplasm of right kidney, except renal pelvis: Secondary | ICD-10-CM | POA: Insufficient documentation

## 2023-09-29 DIAGNOSIS — R609 Edema, unspecified: Secondary | ICD-10-CM | POA: Insufficient documentation

## 2023-09-29 DIAGNOSIS — J90 Pleural effusion, not elsewhere classified: Secondary | ICD-10-CM | POA: Insufficient documentation

## 2023-09-29 DIAGNOSIS — D509 Iron deficiency anemia, unspecified: Secondary | ICD-10-CM

## 2023-09-29 DIAGNOSIS — C786 Secondary malignant neoplasm of retroperitoneum and peritoneum: Secondary | ICD-10-CM | POA: Insufficient documentation

## 2023-09-29 DIAGNOSIS — C787 Secondary malignant neoplasm of liver and intrahepatic bile duct: Secondary | ICD-10-CM | POA: Insufficient documentation

## 2023-09-29 DIAGNOSIS — I3139 Other pericardial effusion (noninflammatory): Secondary | ICD-10-CM | POA: Insufficient documentation

## 2023-09-29 DIAGNOSIS — R7981 Abnormal blood-gas level: Secondary | ICD-10-CM

## 2023-09-29 DIAGNOSIS — D649 Anemia, unspecified: Secondary | ICD-10-CM

## 2023-09-29 LAB — CBC WITH DIFFERENTIAL/PLATELET
Abs Immature Granulocytes: 0.04 10*3/uL (ref 0.00–0.07)
Basophils Absolute: 0 10*3/uL (ref 0.0–0.1)
Basophils Relative: 0 %
Eosinophils Absolute: 0 10*3/uL (ref 0.0–0.5)
Eosinophils Relative: 0 %
HCT: 21.3 % — ABNORMAL LOW (ref 36.0–46.0)
Hemoglobin: 6.9 g/dL — CL (ref 12.0–15.0)
Immature Granulocytes: 1 %
Lymphocytes Relative: 13 %
Lymphs Abs: 0.9 10*3/uL (ref 0.7–4.0)
MCH: 30.8 pg (ref 26.0–34.0)
MCHC: 32.4 g/dL (ref 30.0–36.0)
MCV: 95.1 fL (ref 80.0–100.0)
Monocytes Absolute: 0.4 10*3/uL (ref 0.1–1.0)
Monocytes Relative: 6 %
Neutro Abs: 5.4 10*3/uL (ref 1.7–7.7)
Neutrophils Relative %: 80 %
Platelets: 368 10*3/uL (ref 150–400)
RBC: 2.24 MIL/uL — ABNORMAL LOW (ref 3.87–5.11)
RDW: 15.8 % — ABNORMAL HIGH (ref 11.5–15.5)
WBC: 6.8 10*3/uL (ref 4.0–10.5)
nRBC: 0 % (ref 0.0–0.2)

## 2023-09-29 LAB — COMPREHENSIVE METABOLIC PANEL
ALT: 26 U/L (ref 0–44)
AST: 52 U/L — ABNORMAL HIGH (ref 15–41)
Albumin: 3 g/dL — ABNORMAL LOW (ref 3.5–5.0)
Alkaline Phosphatase: 275 U/L — ABNORMAL HIGH (ref 38–126)
Anion gap: 11 (ref 5–15)
BUN: 28 mg/dL — ABNORMAL HIGH (ref 8–23)
CO2: 26 mmol/L (ref 22–32)
Calcium: 9.5 mg/dL (ref 8.9–10.3)
Chloride: 92 mmol/L — ABNORMAL LOW (ref 98–111)
Creatinine, Ser: 1.41 mg/dL — ABNORMAL HIGH (ref 0.44–1.00)
GFR, Estimated: 42 mL/min — ABNORMAL LOW (ref >60)
Glucose, Bld: 108 mg/dL — ABNORMAL HIGH (ref 70–99)
Potassium: 5.3 mmol/L — ABNORMAL HIGH (ref 3.5–5.1)
Sodium: 129 mmol/L — ABNORMAL LOW (ref 135–145)
Total Bilirubin: 0.4 mg/dL (ref <1.2)
Total Protein: 7.2 g/dL (ref 6.5–8.1)

## 2023-09-29 LAB — PREPARE RBC (CROSSMATCH)

## 2023-09-29 LAB — MAGNESIUM: Magnesium: 2.2 mg/dL (ref 1.7–2.4)

## 2023-09-29 LAB — SAMPLE TO BLOOD BANK

## 2023-09-29 MED ORDER — IOHEXOL 300 MG/ML  SOLN
80.0000 mL | Freq: Once | INTRAMUSCULAR | Status: AC | PRN
  Administered 2023-09-29: 80 mL via INTRAVENOUS

## 2023-09-29 MED ORDER — ACETAMINOPHEN 325 MG PO TABS
650.0000 mg | ORAL_TABLET | Freq: Once | ORAL | Status: AC
  Administered 2023-09-29: 650 mg via ORAL
  Filled 2023-09-29: qty 2

## 2023-09-29 MED ORDER — DIPHENHYDRAMINE HCL 25 MG PO CAPS
25.0000 mg | ORAL_CAPSULE | Freq: Once | ORAL | Status: AC
  Administered 2023-09-29: 25 mg via ORAL
  Filled 2023-09-29: qty 1

## 2023-09-29 MED ORDER — SODIUM CHLORIDE 0.9% IV SOLUTION
250.0000 mL | INTRAVENOUS | Status: DC
  Administered 2023-09-29: 250 mL via INTRAVENOUS

## 2023-09-29 MED ORDER — HEPARIN SOD (PORK) LOCK FLUSH 100 UNIT/ML IV SOLN
500.0000 [IU] | Freq: Once | INTRAVENOUS | Status: AC
  Administered 2023-09-29: 500 [IU] via INTRAVENOUS

## 2023-09-29 MED ORDER — HEPARIN SOD (PORK) LOCK FLUSH 100 UNIT/ML IV SOLN
INTRAVENOUS | Status: AC
  Filled 2023-09-29: qty 5

## 2023-09-29 MED ORDER — HEPARIN SOD (PORK) LOCK FLUSH 100 UNIT/ML IV SOLN
500.0000 [IU] | Freq: Every day | INTRAVENOUS | Status: AC | PRN
  Administered 2023-09-29: 500 [IU]

## 2023-09-29 MED ORDER — SODIUM CHLORIDE 0.9% FLUSH
10.0000 mL | INTRAVENOUS | Status: AC | PRN
  Administered 2023-09-29: 10 mL

## 2023-09-29 NOTE — Progress Notes (Signed)
Order placed for home O2 and portable equipment.  Order was accepted and [employer] will be contacting patient for delivery.

## 2023-09-29 NOTE — Progress Notes (Signed)
Hemoglobin today is 6.9.  We will proceed with two units of PRBC per standing orders by Dr. Ellin Saba.  Oxygen saturation at arrival on room air was 75%.  Patient placed on 2 L via [place] and sats increased to 83%.  Patient placed on 4 L via [place] and sats increased to 91%.  MD made aware.  Referral for home oxygen initiated by Cynda Ac[place], RN.    Patient tolerated transfusions well with no complaints voiced.  Patient transported via wheelchair with oxygen accompanied by friend in stable condition.  Vital signs stable at discharge.  Follow up as scheduled.

## 2023-09-29 NOTE — Progress Notes (Signed)
O2 on Room Air - 75% O2 on Room Air - 70% walking O2 on 2 liters - 83% O2 on 4 liters - 91-93%

## 2023-09-29 NOTE — Progress Notes (Signed)
Two units blood ordered today for hemoglobin of 6.9 per MD

## 2023-09-29 NOTE — Patient Instructions (Signed)

## 2023-09-29 NOTE — Progress Notes (Unsigned)
CRITICAL VALUE ALERT Critical value received:  HGB 6.9 Date of notification:  09-29-23 Time of notification: 0910 Critical value read back:  Yes.   Nurse who received alert:  C. Yolette Hastings RN MD notified time and response:  Dr. Ellin Saba, [phone]  give 2 units of blood

## 2023-09-30 LAB — BPAM RBC
Blood Product Expiration Date: 202501132359
Blood Product Expiration Date: 202501132359
ISSUE DATE / TIME: 202412231242
ISSUE DATE / TIME: 202412231421
Unit Type and Rh: 5100
Unit Type and Rh: 5100

## 2023-09-30 LAB — TYPE AND SCREEN
ABO/RH(D): O POS
Antibody Screen: NEGATIVE
Unit division: 0
Unit division: 0

## 2023-10-07 ENCOUNTER — Other Ambulatory Visit (HOSPITAL_COMMUNITY): Payer: Self-pay

## 2023-10-07 ENCOUNTER — Encounter: Payer: Self-pay | Admitting: *Deleted

## 2023-10-07 ENCOUNTER — Encounter: Payer: Self-pay | Admitting: Hematology

## 2023-10-07 ENCOUNTER — Inpatient Hospital Stay (HOSPITAL_BASED_OUTPATIENT_CLINIC_OR_DEPARTMENT_OTHER): Payer: 59 | Admitting: Hematology

## 2023-10-07 ENCOUNTER — Telehealth: Payer: Self-pay

## 2023-10-07 ENCOUNTER — Ambulatory Visit (HOSPITAL_COMMUNITY): Payer: 59 | Admitting: Psychiatry

## 2023-10-07 ENCOUNTER — Inpatient Hospital Stay: Payer: 59

## 2023-10-07 VITALS — BP 138/76 | HR 93 | Temp 98.3°F | Resp 20

## 2023-10-07 DIAGNOSIS — C641 Malignant neoplasm of right kidney, except renal pelvis: Secondary | ICD-10-CM

## 2023-10-07 DIAGNOSIS — Z95828 Presence of other vascular implants and grafts: Secondary | ICD-10-CM

## 2023-10-07 LAB — CBC WITH DIFFERENTIAL/PLATELET
Abs Immature Granulocytes: 0.03 10*3/uL (ref 0.00–0.07)
Basophils Absolute: 0 10*3/uL (ref 0.0–0.1)
Basophils Relative: 0 %
Eosinophils Absolute: 0 10*3/uL (ref 0.0–0.5)
Eosinophils Relative: 0 %
HCT: 23.4 % — ABNORMAL LOW (ref 36.0–46.0)
Hemoglobin: 7.6 g/dL — ABNORMAL LOW (ref 12.0–15.0)
Immature Granulocytes: 0 %
Lymphocytes Relative: 10 %
Lymphs Abs: 0.7 10*3/uL (ref 0.7–4.0)
MCH: 30.4 pg (ref 26.0–34.0)
MCHC: 32.5 g/dL (ref 30.0–36.0)
MCV: 93.6 fL (ref 80.0–100.0)
Monocytes Absolute: 0.5 10*3/uL (ref 0.1–1.0)
Monocytes Relative: 7 %
Neutro Abs: 5.6 10*3/uL (ref 1.7–7.7)
Neutrophils Relative %: 83 %
Platelets: 297 10*3/uL (ref 150–400)
RBC: 2.5 MIL/uL — ABNORMAL LOW (ref 3.87–5.11)
RDW: 16.1 % — ABNORMAL HIGH (ref 11.5–15.5)
WBC: 6.8 10*3/uL (ref 4.0–10.5)
nRBC: 0 % (ref 0.0–0.2)

## 2023-10-07 LAB — COMPREHENSIVE METABOLIC PANEL
ALT: 23 U/L (ref 0–44)
AST: 65 U/L — ABNORMAL HIGH (ref 15–41)
Albumin: 3 g/dL — ABNORMAL LOW (ref 3.5–5.0)
Alkaline Phosphatase: 291 U/L — ABNORMAL HIGH (ref 38–126)
Anion gap: 9 (ref 5–15)
BUN: 32 mg/dL — ABNORMAL HIGH (ref 8–23)
CO2: 24 mmol/L (ref 22–32)
Calcium: 9 mg/dL (ref 8.9–10.3)
Chloride: 94 mmol/L — ABNORMAL LOW (ref 98–111)
Creatinine, Ser: 1.38 mg/dL — ABNORMAL HIGH (ref 0.44–1.00)
GFR, Estimated: 43 mL/min — ABNORMAL LOW (ref >60)
Glucose, Bld: 99 mg/dL (ref 70–99)
Potassium: 5.1 mmol/L (ref 3.5–5.1)
Sodium: 127 mmol/L — ABNORMAL LOW (ref 135–145)
Total Bilirubin: 0.5 mg/dL (ref 0.0–1.2)
Total Protein: 6.8 g/dL (ref 6.5–8.1)

## 2023-10-07 LAB — SAMPLE TO BLOOD BANK

## 2023-10-07 LAB — MAGNESIUM: Magnesium: 2.2 mg/dL (ref 1.7–2.4)

## 2023-10-07 LAB — PREPARE RBC (CROSSMATCH)

## 2023-10-07 MED ORDER — CABOMETYX 40 MG PO TABS: 40.0000 mg | ORAL_TABLET | Freq: Every day | ORAL | 1 refills | Status: DC

## 2023-10-07 MED ORDER — SODIUM CHLORIDE FLUSH 0.9 % IV SOLN
10.0000 mL | Freq: Once | INTRAVENOUS | Status: AC
  Administered 2023-10-07: 10 mL via INTRAVENOUS
  Filled 2023-10-07: qty 10

## 2023-10-07 MED ORDER — HEPARIN SOD (PORK) LOCK FLUSH 100 UNIT/ML IV SOLN
500.0000 [IU] | Freq: Once | INTRAVENOUS | Status: AC
  Administered 2023-10-07: 500 [IU] via INTRAVENOUS

## 2023-10-07 NOTE — Progress Notes (Signed)
Port flushed with good blood return noted. No bruising or swelling at site. Bandaid applied and patient discharged in satisfactory condition. VVS stable with no signs or symptoms of distressed noted. 

## 2023-10-07 NOTE — Progress Notes (Signed)
Hgb 7.6.  Per Dr. Ellin Saba, patient will get 2 units of PRBC on Friday 10/10/23 at 8 am. Patient aware and notified not to remove blood bracelet.  Verbalized understanding.

## 2023-10-07 NOTE — Telephone Encounter (Signed)
 Oral Oncology Patient Advocate Encounter  New authorization   Received notification that prior authorization for [employer]  is required.   PA submitted on 10/07/23  Key [id number]  Status is pending     Morene Potters, CPhT Oncology Pharmacy Patient Advocate  [facility]  [phone] (phone) [phone] (fax) 10/07/2023 3:53 PM

## 2023-10-07 NOTE — Progress Notes (Signed)
Blood bank made aware for 2 units to be transfused Friday, 10/09/2022.

## 2023-10-07 NOTE — Progress Notes (Signed)
 [facility] [address] [place] [address]    Clinic Day:  10/07/23   Referring physician: [name], MD  Patient Care Team: [name], MD as PCP - General Mallipeddi, [name], MD as PCP - Cardiology (Cardiology) [name], MD as Medical Oncologist (Medical Oncology)   ASSESSMENT & PLAN:   Assessment: 1. Metastatic clear-cell renal cell carcinoma: - Right radical nephrectomy on 02/06/2021. - Pathology shows clear-cell RCC, grade 4, 12.5 cm, necrosis and rhabdoid features are identified.  Tumor extends into the and invades the wall of the vena cava (pT3c).  Vascular, ureteral and all margins of resection are negative for tumor. - CT scan abdomen with and without contrast on 05/29/2021 showed several nodules along the peritoneal surface of the right nephrectomy bed warranting close attention. - CTAP with and without contrast on 09/18/2021 showed progressive nodularity within the right nephrectomy bed, with enlarged nodules adjacent to the right hepatic lobe extending inferiorly along the right retroperitoneum with multiple enlarged enhancing nodules.  Direct metastatic invasion into the right hepatic lobe.  Small nodule at the right lung base favored to be benign. - IMDC: Intermediate risk group with 2 features including diagnosis less than 1 year and hemoglobin lower than normal. - Bone scan on 10/05/2021 with focal activity in the mental vertex of the mandible which could be inflammatory/traumatic/metastatic. - CT chest on 10/03/2021 showed several lung nodules scattered bilaterally some of which could be metastatic and many others could be due to mucoid impaction. - NGS testing did not show any targetable mutations.  TMB was low.  CCND3 amplified.  PBRM1 VUS present. - Right nephrectomy bed biopsy (10/22/2021): Clear-cell renal cell carcinoma with necrosis - 4 cycles of ipilimumab  and nivolumab  from 11/19/2021 through 01/24/2022 - CT CAP  on 02/14/2022: Numerous new and enlarged lung nodules.  Significant interval increase in the mass in the right renal bed.  New hypodense liver lesions. - Lenvatinib  (12 mg) and everolimus  5 mg daily started on 03/13/2022, discontinued on 07/22/2023 due to progression. - Belzutifan  120 mg daily started on 07/29/2023   2. Social/family history: - She lives at home with her husband and also takes care of her mother. - She works as a child psychotherapist at [employer].  She quit smoking in 2004 and smoked half pack per day for 10 years. - Half sister (same mother) had kidney cancer at age 40.  Maternal grandfather had kidney cancer.  Sister died of lung cancer.  2 half sisters (same father) had breast cancers.  3.  Left breast stage I tubular adenocarcinoma: - She underwent left mastectomy on 06/11/2002.  0/6 lymph nodes positive.  ER 90%, PR 92%, Ki-67 6%, HER2 negative.  As per chart, declined antiestrogen therapy.    Plan: 1. Metastatic clear-cell RCC, intermediate risk by IMDC: - She is tolerating belzutifan  very well. - Reviewed CT CAP from 09/29/2023: Massive increase in metastatic disease with new areas. - We have discussed best supportive care in the form of hospice.  She is not ready for it yet.  We will refer to palliative care.  Will start her on cabozantinib at 40 mg daily.  We discussed side effects and detail including hypotension, diarrhea, fatigue among others. - Will get baseline labs today.  Will reevaluate her in 2 to 3 weeks.  2.  Normocytic anemia: - Normocytic anemia from chronic inflammation, CKD and belzutifan . - Last hemoglobin was 6.9 on 09/29/2023 and she received 2 units  PRBC.  Will check CBC today to see if she needs any transfusion.  3.  Hypothyroidism: - She will continue Synthroid  125 mcg daily.   4.  Hypertension: - She continues to be off of olmesartan /HCTZ.  Blood pressure today is 138/76.  Will closely monitor after starting cabozantinib.  5.   Constipation: - Continue stool softener daily as needed when she takes pain medication.  6.  Right loin pain: - She has a right loin mass with occasional pain.  She reported that she took hydrocodone  today after 3 weeks.  She reported pain as 4 out of 10.    Orders Placed This Encounter  Procedures   CBC with Differential    Standing Status:   Future    Number of Occurrences:   1    Expected Date:   10/07/2023    Expiration Date:   10/06/2024   Comprehensive metabolic panel    Standing Status:   Future    Number of Occurrences:   1    Expected Date:   10/07/2023    Expiration Date:   10/06/2024   Magnesium     Standing Status:   Future    Number of Occurrences:   1    Expected Date:   10/07/2023    Expiration Date:   10/06/2024   Ambulatory referral to Social Work    Referral Priority:   Routine    Referral Type:   Consultation    Referral Reason:   Specialty Services Required    Number of Visits Requested:   1   Sample to Blood Bank(Blood Bank Hold)    Standing Status:   Future    Number of Occurrences:   1    Expected Date:   10/07/2023    Expiration Date:   10/06/2024      [name],acting as a scribe for Alean Stands, MD.,have documented all relevant documentation on the behalf of Alean Stands, MD,as directed by  Alean Stands, MD while in the presence of Alean Stands, MD.   I, Alean Stands MD, have reviewed the above documentation for accuracy and completeness, and I agree with the above.        Alean Stands, MD   12/31/20243:33 PM  CHIEF COMPLAINT:   Diagnosis: metastatic clear-cell renal cell carcinoma    Cancer Staging  Malignant neoplasm of female breast [facility]) Staging form: Breast, AJCC 6th Edition - Clinical: Stage I (T1b, N0, M0) - Signed by Berry Debby RAMAN, PA on 08/16/2011  Renal cell cancer [facility]) Staging form: Kidney, AJCC 8th Edition - Clinical stage from 09/26/2021: Stage IV (cT3c, cNX, cM1)  - Unsigned    Prior Therapy: Nivolumab  plus ipilimumab  for 4 cycles with progression, lenvatinib  and everolimus   Current Therapy: Belzutifan    HISTORY OF PRESENT ILLNESS:   Oncology History  Renal cell cancer (HCC)  06/20/2021 Initial Diagnosis   Renal cell carcinoma of right kidney (HCC)   11/19/2021 - 01/24/2022 Chemotherapy   Patient is on Treatment Plan : RENAL CELL CARCINOMA Nivolumab  + Ipilimumab  q21d / Nivolumab  q28d      Genetic Testing   Negative genetic testing. No pathogenic variants identified on the [facility] Multi-Cancer Panel + RNA. VUS in PMS2 called c.1732C>A and in RECQL4 called c.2967G>A  Identified. The report date is 11/01/2021.   The Multi-Cancer Panel + RNA offered by [facility] includes sequencing and/or deletion duplication testing of the following 84 genes: AIP, ALK, APC, ATM, AXIN2,BAP1,  BARD1, BLM, BMPR1A, BRCA1, BRCA2, BRIP1, CASR, CDC73,  CDH1, CDK4, CDKN1B, CDKN1C, CDKN2A (p14ARF), CDKN2A (p16INK4a), CEBPA, CHEK2, CTNNA1, DICER1, DIS3L2, EGFR (c.2369C>T, p.Thr790Met variant only), EPCAM (Deletion/duplication testing only), FH, FLCN, GATA2, GPC3, GREM1 (Promoter region deletion/duplication testing only), HOXB13 (c.251G>A, p.Gly84Glu), HRAS, KIT, MAX, MEN1, MET, MITF (c.952G>A, p.Glu318Lys variant only), MLH1, MSH2, MSH3, MSH6, MUTYH, NBN, NF1, NF2, NTHL1, PALB2, PDGFRA, PHOX2B, PMS2, POLD1, POLE, POT1, PRKAR1A, PTCH1, PTEN, RAD50, RAD51C, RAD51D, RB1, RECQL4, RET, RUNX1, SDHAF2, SDHA (sequence changes only), SDHB, SDHC, SDHD, SMAD4, SMARCA4, SMARCB1, SMARCE1, STK11, SUFU, TERC, TERT, TMEM127, TP53, TSC1, TSC2, VHL, WRN and WT1.       INTERVAL HISTORY:   Lakeesha is a 62 y.o. female presenting to clinic today for follow up of metastatic clear-cell renal cell carcinoma. She was last seen by me on 09/09/23.  Since her last visit, she underwent CT C/A/P on 09/29/23 that found: massive increase in metastatic disease, includes both previously seen disease and new areas of  disease; extensive increase in lung nodules, liver lesions; marked increase in abnormal soft tissue involving the surgical bed with confluence extension now towards the midline and porta hepatis with mass effect along structures; increasing extension through the posterior aspect of the abdomen involving the right twelfth rib and underlying fat and musculature; developing abnormal lymph nodes in the mediastinum, hila and retroperitoneum as well as possible supraclavicular on the left; increasing peritoneal carcinomatosis including pelvic lesion; areas suggestive pleural metastasis on the right; increasing pleural effusions, right-greater-than-left; new consolidation of the right lower lobe; increasing pericardial effusion; significant increase in edema of the soft tissues and mesenteric stranding; and delayed excretion of contrast from the left kidney.  Today, she states that she is doing well overall. Her appetite level is at 100%. Her energy level is at 50%. She is accompanied by family members.  She notes a normal appetite and is drinking 1-2 Ensures a day. She denies any nausea or vomiting. She reports pain in the bilateral lower extremities from ankles to upper thighs. She notes pain on the right flank. She denies any pain to the right side of the back. She is mostly eating, drinking, and lying down. She uses a cane or walker to ambulate. She denies any falls. She reports she is not in a lot of pain, though today was the first day she has taken pain medication in 3 weeks, which was 0.5 pill of hydrocodone . The only medication she is taking is Synthroid . She has been using oxygen at home.   She reports an increase in energy after receiving blood transfusions. She wishes to proceed with different treatment options.   PAST MEDICAL HISTORY:   Past Medical History: Past Medical History:  Diagnosis Date   Adenocarcinoma of breast (HCC)    left    Cellulitis of leg, left    Complication of anesthesia     Hard to wake up   Depression    Diabetes mellitus without complication (HCC)    Pt denies   Family history of breast cancer 08/16/2011   Family history of breast cancer    Family history of colon cancer    Family history of kidney cancer    Family history of prostate cancer    Hyperlipidemia    Hypertension    Hypothyroidism    MRSA (methicillin resistant staph aureus) culture positive 08/19/2011   Pre-diabetes     Surgical History: Past Surgical History:  Procedure Laterality Date   BREAST SURGERY Left    mastectomy   CATARACT EXTRACTION W/PHACO Right 06/06/2023   Procedure:  CATARACT EXTRACTION PHACO AND INTRAOCULAR LENS PLACEMENT (IOC);  Surgeon: Harrie Agent, MD;  Location: [facility];  Service: Ophthalmology;  Laterality: Right;  CDE 5.61   CATARACT EXTRACTION W/PHACO Left 07/21/2023   Procedure: CATARACT EXTRACTION PHACO AND INTRAOCULAR LENS PLACEMENT (IOC);  Surgeon: Harrie Agent, MD;  Location: [facility];  Service: Ophthalmology;  Laterality: Left;  CDE: 5.96   CESAREAN SECTION     x2   COLONOSCOPY N/A 03/03/2020   Procedure: COLONOSCOPY;  Surgeon: Shaaron Lamar HERO, MD;  Location: [facility];  Service: Endoscopy;  Laterality: N/A;  12:00   IR IMAGING GUIDED PORT INSERTION  11/07/2021   left mastectomy     NEPHRECTOMY Right 02/06/2021   Procedure: NEPHRECTOMY- open radical;  Surgeon: Sherrilee Belvie CROME, MD;  Location: [facility];  Service: Urology;  Laterality: Right;   POLYPECTOMY  03/03/2020   Procedure: POLYPECTOMY;  Surgeon: Shaaron Lamar HERO, MD;  Location: [facility];  Service: Endoscopy;;    Social History: Social History   Socioeconomic History   Marital status: Married    Spouse name: Not on file   Number of children: 2   Years of education: Not on file   Highest education level: GED or equivalent  Occupational History   Occupation: CNA  Tobacco Use   Smoking status: Former    Current packs/day: 0.00    Average packs/day: 0.5 packs/day for 18.0 years (9.0  ttl pk-yrs)    Types: Cigarettes    Start date: 07/01/1984    Quit date: 07/01/2002    Years since quitting: 21.2   Smokeless tobacco: Never  Vaping Use   Vaping status: Never Used  Substance and Sexual Activity   Alcohol use: No   Drug use: No   Sexual activity: Yes    Birth control/protection: None  Other Topics Concern   Not on file  Social History Narrative   Not on file   Social Drivers of Health   Financial Resource Strain: Medium Risk (08/14/2023)   Overall Financial Resource Strain (CARDIA)    Difficulty of Paying Living Expenses: Somewhat hard  Food Insecurity: No Food Insecurity (08/14/2023)   Hunger Vital Sign    Worried About Running Out of Food in the Last Year: Never true    Ran Out of Food in the Last Year: Never true  Transportation Needs: No Transportation Needs (08/14/2023)   PRAPARE - Administrator, Civil Service (Medical): No    Lack of Transportation (Non-Medical): No  Physical Activity: Insufficiently Active (08/14/2023)   Exercise Vital Sign    Days of Exercise per Week: 1 day    Minutes of Exercise per Session: 10 min  Stress: No Stress Concern Present (08/14/2023)   [employer] of Occupational Health - Occupational Stress Questionnaire    Feeling of Stress : Only a little  Social Connections: Moderately Integrated (08/14/2023)   Social Connection and Isolation Panel [NHANES]    Frequency of Communication with Friends and Family: Twice a week    Frequency of Social Gatherings with Friends and Family: More than three times a week    Attends Religious Services: More than 4 times per year    Active Member of [employer] or Organizations: Yes    Attends Engineer, Structural: More than 4 times per year    Marital Status: Separated  Intimate Partner Violence: Not on file    Family History: Family History  Problem Relation Age of Onset   Alcohol abuse Mother    Hypertension  Mother    Diabetes Mother    Hyperlipidemia Mother     Colon cancer Maternal Aunt        dx 9s   Prostate cancer Maternal Uncle    Throat cancer Maternal Uncle    Kidney cancer Maternal Grandfather    Lung cancer Half-Sister    Kidney cancer Half-Sister 37   Breast cancer Half-Sister 51   Breast cancer Half-Sister 77    Current Medications:  Current Outpatient Medications:    acetaminophen  (TYLENOL ) 500 MG tablet, Take 500 mg by mouth every 6 (six) hours as needed for moderate pain or headache., Disp: , Rfl:    atorvastatin  (LIPITOR) 40 MG tablet, Take 1 tablet (40 mg total) by mouth daily., Disp: 90 tablet, Rfl: 1   belzutifan  (WELIREG ) 40 MG tablet, Take 3 tablets (120 mg total) by mouth daily., Disp: 90 tablet, Rfl: 1   benzonatate  (TESSALON ) 100 MG capsule, Take 1 capsule (100 mg total) by mouth 2 (two) times daily as needed for cough., Disp: 20 capsule, Rfl: 0   cabozantinib (CABOMETYX ) 40 MG tablet, Take 1 tablet (40 mg total) by mouth daily. Take on an empty stomach, 1 hour before or 2 hours after meals., Disp: 30 tablet, Rfl: 1   EPINEPHrine  0.3 mg/0.3 mL IJ SOAJ injection, Inject 0.3 mg into the muscle as needed for anaphylaxis., Disp: 2 each, Rfl: 0   HYDROcodone -acetaminophen  (NORCO) 5-325 MG tablet, Take 1 tablet by mouth every 6 (six) hours as needed for moderate pain (pain score 4-6)., Disp: 30 tablet, Rfl: 0   levothyroxine  (SYNTHROID ) 125 MCG tablet, Take 1 tablet (125 mcg total) by mouth daily before breakfast., Disp: 90 tablet, Rfl: 1   Multiple Vitamin (MULTIVITAMIN WITH MINERALS) TABS tablet, Take 1 tablet by mouth daily., Disp: , Rfl:    olmesartan -hydrochlorothiazide (BENICAR  HCT) 40-25 MG tablet, TAKE 1 TABLET BY MOUTH DAILY, Disp: 30 tablet, Rfl: 1   UNABLE TO FIND, MASTECTOMY BRA AND PROTHESIS DX Z90.12, Disp: 6 each, Rfl: 2   UNABLE TO FIND, 6 mastectomy prosthesis and bras., Disp: 6 each, Rfl: 0   urea  (CARMOL) 10 % cream, Apply topically 2 (two) times daily. Apply twice daily to hands, Disp: 71 g, Rfl: 0 No current  facility-administered medications for this visit.  Facility-Administered Medications Ordered in Other Visits:    heparin  lock flush 100 unit/mL, 500 Units, Intravenous, Once, Loisann Roach, MD   Allergies: Allergies  Allergen Reactions   Amlodipine  Swelling    Leg edema   Sulfonamide Derivatives Itching   Yellow Jacket Venom [Bee Venom] Rash    urticaria    REVIEW OF SYSTEMS:   Review of Systems  Constitutional:  Negative for chills, fatigue and fever.  HENT:   Negative for lump/mass, mouth sores, nosebleeds, sore throat and trouble swallowing.   Eyes:  Negative for eye problems.  Respiratory:  Negative for cough and shortness of breath.   Cardiovascular:  Positive for leg swelling (extends to hips). Negative for chest pain and palpitations.  Gastrointestinal:  Negative for abdominal pain, constipation, diarrhea, nausea and vomiting.  Genitourinary:  Negative for bladder incontinence, difficulty urinating, dysuria, frequency, hematuria and nocturia.   Musculoskeletal:  Positive for flank pain (on the right side, 4/10 severity). Negative for arthralgias, back pain, myalgias and neck pain.  Skin:  Negative for itching and rash.  Neurological:  Negative for dizziness, headaches and numbness.  Hematological:  Does not bruise/bleed easily.  Psychiatric/Behavioral:  Negative for depression, sleep disturbance and suicidal ideas. The  patient is not nervous/anxious.   All other systems reviewed and are negative.    VITALS:   Blood pressure 138/76, pulse 93, temperature 98.3 F (36.8 C), temperature source Oral, resp. rate 20, SpO2 (!) 85%.  Wt Readings from Last 3 Encounters:  09/09/23 162 lb 7.7 oz (73.7 kg)  08/14/23 151 lb 1.3 oz (68.5 kg)  08/12/23 149 lb 9.6 oz (67.9 kg)    There is no height or weight on file to calculate BMI.  Performance status (ECOG): 0 - Asymptomatic  PHYSICAL EXAM:   Physical Exam Vitals and nursing note reviewed. Exam conducted with a  chaperone present.  Constitutional:      Appearance: Normal appearance.  Cardiovascular:     Rate and Rhythm: Normal rate and regular rhythm.     Pulses: Normal pulses.     Heart sounds: Normal heart sounds.  Pulmonary:     Effort: Pulmonary effort is normal.     Breath sounds: Normal breath sounds.  Abdominal:     Palpations: Abdomen is soft. There is no hepatomegaly, splenomegaly or mass.     Tenderness: There is no abdominal tenderness.  Musculoskeletal:     Right lower leg: 3+ Edema (from the ankles to upper thighs) present.     Left lower leg: 3+ Edema (from the ankles to upper thighs) present.  Lymphadenopathy:     Cervical: No cervical adenopathy.     Right cervical: No superficial, deep or posterior cervical adenopathy.    Left cervical: No superficial, deep or posterior cervical adenopathy.     Upper Body:     Right upper body: No supraclavicular or axillary adenopathy.     Left upper body: No supraclavicular or axillary adenopathy.  Neurological:     General: No focal deficit present.     Mental Status: She is alert and oriented to person, place, and time.  Psychiatric:        Mood and Affect: Mood normal.        Behavior: Behavior normal.     LABS:      Latest Ref Rng & Units 09/29/2023    8:12 AM 09/09/2023   11:53 AM 08/26/2023    9:35 AM  CBC  WBC 4.0 - 10.5 K/uL 6.8  7.0  6.0   Hemoglobin 12.0 - 15.0 g/dL 6.9  8.2  9.2   Hematocrit 36.0 - 46.0 % 21.3  26.9  29.3   Platelets 150 - 400 K/uL 368  279  343       Latest Ref Rng & Units 09/29/2023    8:12 AM 09/09/2023   11:53 AM 08/12/2023   10:04 AM  CMP  Glucose 70 - 99 mg/dL 891  886  890   BUN 8 - 23 mg/dL 28  29  19    Creatinine 0.44 - 1.00 mg/dL 8.58  8.53  8.83   Sodium 135 - 145 mmol/L 129  133  131   Potassium 3.5 - 5.1 mmol/L 5.3  4.4  4.5   Chloride 98 - 111 mmol/L 92  95  92   CO2 22 - 32 mmol/L 26  25  28    Calcium  8.9 - 10.3 mg/dL 9.5  9.6  9.5   Total Protein 6.5 - 8.1 g/dL 7.2  7.4   7.2   Total Bilirubin <1.2 mg/dL 0.4  0.3  0.3   Alkaline Phos 38 - 126 U/L 275  177  108   AST 15 - 41 U/L 52  63  31   ALT 0 - 44 U/L 26  34  21      No results found for: CEA1, CEA / No results found for: CEA1, CEA No results found for: PSA1 No results found for: CAN199 No results found for: CAN125  No results found for: STEPHANY RINGS, A1GS, A2GS, BETS, BETA2SER, GAMS, MSPIKE, SPEI Lab Results  Component Value Date   TIBC 284 08/26/2023   TIBC 308 07/15/2023   TIBC 307 04/29/2023   FERRITIN 272 08/26/2023   FERRITIN 180 07/15/2023   FERRITIN 152 04/29/2023   IRONPCTSAT 17 08/26/2023   IRONPCTSAT 21 07/15/2023   IRONPCTSAT 20 04/29/2023   Lab Results  Component Value Date   LDH 178 07/15/2023   LDH 178 02/21/2022   LDH 152 09/27/2021     STUDIES:   CT CHEST ABDOMEN PELVIS W CONTRAST Result Date: 10/07/2023 CLINICAL DATA:  Metastatic renal cell carcinoma. * Tracking Code: BO * EXAM: CT CHEST, ABDOMEN, AND PELVIS WITH CONTRAST TECHNIQUE: Multidetector CT imaging of the chest, abdomen and pelvis was performed following the standard protocol during bolus administration of intravenous contrast. RADIATION DOSE REDUCTION: This exam was performed according to the departmental dose-optimization program which includes automated exposure control, adjustment of the mA and/or kV according to patient size and/or use of iterative reconstruction technique. CONTRAST:  80mL OMNIPAQUE  IOHEXOL  300 MG/ML  SOLN COMPARISON:  07/15/2023.  And older FINDINGS: CT CHEST FINDINGS Cardiovascular: Right IJ chest port. Port is accessed. Tip seen as far as the right atrium. Developing, small pericardial effusion. Heart itself is nonenlarged. Thoracic aorta has a normal course and caliber with mild atherosclerotic calcified plaque. Mediastinum/Nodes: Small thyroid  gland. Normal caliber thoracic esophagus. Marked increase in mediastinal nodal enlargement. The node in  the AP window region which previously has a short axis of 10 mm, today on series 2, image 19 measures 26 mm in short axis. Additional abnormal node along the margin left hilum on image 22 with short axis of 19 mm. Subcarinal node on the right side on series 2, image 25 is new with short axis of 21 mm. No abnormal axillary nodal enlargement. Left axillary surgical clips are identified. Again changes of left mastectomy. Abnormal right hilar nodes are also suspected such as inferiorly on series 2 image 27. Prominent supraclavicular left-sided nodes identified at the edge of the imaging field on series 2, image 4. Lungs/Pleura: Increasing small right pleural effusion and tiny left. Increasing pulmonary nodules identified. The anterior left upper lobe nodule which previously measured 8 x 6 mm, today on series 3, image 25 measures 2.7 by 2.2 cm. New nodule just lateral and posterior to this on image 26 measures 2.8 by 1.9 cm. New lesion towards the lingula on series 3, image 57 measures 4.8 by 2.2 cm. There are several other small nodules scattered in both lungs there is also areas of pleural nodularity along the inferior right lower lobe such as series 2 image 35 which could represent pleural metastases. Adjacent consolidative lung opacity in the right lower lobe with some air bronchograms. Musculoskeletal: Scattered degenerative changes. CT ABDOMEN PELVIS FINDINGS Hepatobiliary: Increasing hepatic metastases are identified. Lesion seen in segment 4 which previously measured 2.6 x 2.1 cm, today measures 5.2 by 5.9 cm on series 2, image 62. Multiple liver lesions are seen scattered in the liver, clearly enlarged. Portal vein is grossly patent but narrowed of the hilum related to adjacent mass effect from lesions. Pancreas: Unremarkable. No pancreatic ductal dilatation or surrounding inflammatory changes. Spleen:  Normal in size without focal abnormality. Adrenals/Urinary Tract: Left adrenal gland is slightly thickened. No  enhancing left-sided renal mass or collecting system dilatation. On delayed there is delayed excretion of contrast from the kidney. Preserved contours of the urinary bladder. The right kidney is not seen. Please correlate with the prior surgery. The right adrenal gland is not seen. The large confluence mass in the surgical bed is increased in size significantly with the extension through the posterior aspect of the abdomen into the subcutaneous fat and musculature of the back. Is also involvement of the twelfth rib with progressive destruction and absence of bone material. Measurement estimated at 15.9 by 11.5 cm on series 2, image 56. Previously estimated at 11.7 by 3.1 cm. There is more extension into the hilum with mass effect along liver hilar structures. There is obscuration of the IVC with likely involvement of the IVC itself. Tumor in vein is possible. The component extending into the musculature posterolaterally previously measured 4.8 x 3.0 cm and today on series 2, image 62 7.6 by 4.1 cm. Stomach/Bowel: On this non oral contrast exam the visualized bowel is nondilated. Scattered stool. Abnormal soft tissue nodules extend along the right pericolic gutter and again are increased. Previously the area was measured at 4.5 x 2.3 cm and today on series 2, image 73 9.3 by 6.4 cm. Vascular/Lymphatic: Normal caliber aorta with scattered vascular calcifications. 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 Numerous collaterals identified with increasing anasarca and mesenteric stranding. Several abnormal nodes identified throughout the retroperitoneum which are increased. Example left para-aortic on series 2, image 61 measures 2.4 x 1.7 cm. Reproductive: Uterus is present. There is a mass lesion which is new to the right and superior of the uterus. This could be an adnexal or ovarian metastasis measuring 4.8 by 5.4 cm on series 2, image 94. Other: Mass lesion in the  mesentery posteriorly on the right side along the surface of the psoas muscle which previously measured 3.3 x 3.2 cm, today on series 2, image 79 measures 7.6 by 6.6 cm. Musculoskeletal: Curvature and degenerative changes along the spine. Again there is destruction of the right twelfth rib. IMPRESSION: Massive increase in metastatic disease. This includes both previously seen disease and new areas of disease. Extensive increase in lung nodules, liver lesions. Marked increase in abnormal soft tissue involving the surgical bed with confluence extension now towards the midline and porta hepatis with mass effect along structures. There also increasing extension through the posterior aspect of the abdomen involving the right twelfth rib and underlying fat and musculature. Developing abnormal lymph nodes in the mediastinum, hila and retroperitoneum as well as possible supraclavicular on the left. Increasing peritoneal carcinomatosis including pelvic lesion. Areas suggestive pleural metastasis on the right. Increasing pleural effusions, right-greater-than-left. Also new consolidation of the right lower lobe. Increasing pericardial effusion. Significant increase in edema of the soft tissues and mesenteric stranding. Delayed excretion of contrast from the left kidney. Please correlate with patient's renal function. In addition overall the relative enhancement of the parenchymal organs is less than expected. Critical Value/emergent results were called by telephone at the time of interpretation on 10/07/2023 at 8:08 am to provider [facility] , who verbally acknowledged these results. Electronically Signed   By: Ranell Bring M.D.   On: 10/07/2023 11:08

## 2023-10-07 NOTE — Addendum Note (Signed)
 Addended by: Toniann Fail B on: 10/07/2023 04:31 PM   Modules accepted: Orders

## 2023-10-07 NOTE — Patient Instructions (Addendum)
 [facility]  Discharge Instructions  You were seen and examined today by Dr. Rogers.  Your CT scan shows extensive cancer progression at this time. Dr. Katragadda has recommended stopping your current therapy. He will switch you to a different pill known as Cabometyx . We will check your labs today to see if you need blood.  Dr. Rogers will refer you to palliative care to meet with them so that they can help you manage in home symptoms moving forward.  Follow-up as scheduled.  Thank you for choosing [name] to provide your oncology and hematology care.   To afford each patient quality time with our provider, please arrive at least 15 minutes before your scheduled appointment time. You may need to reschedule your appointment if you arrive late (10 or more minutes). Arriving late affects you and other patients whose appointments are after yours.  Also, if you miss three or more appointments without notifying the office, you may be dismissed from the clinic at the provider's discretion.    Again, thank you for choosing [facility].  Our hope is that these requests will decrease the amount of time that you wait before being seen by our physicians.   If you have a lab appointment with the Cancer Center - please note that after April 8th, all labs will be drawn in the cancer center.  You do not have to check in or register with the main entrance as you have in the past but will complete your check-in at the cancer center.            _____________________________________________________________  Should you have questions after your visit to [facility], please contact our office at [phone] and follow the prompts.  Our office hours are 8:00 a.m. to 4:30 p.m. Monday - Thursday and 8:00 a.m. to 2:30 p.m. Friday.  Please note that voicemails left after 4:00 p.m. may not be returned until the following  business day.  We are closed weekends and all major holidays.  You do have access to a nurse 24-7, just call the main number to the clinic [phone] and do not press any options, hold on the line and a nurse will answer the phone.    For prescription refill requests, have your pharmacy contact our office and allow 72 hours.    Masks are no longer required in the cancer centers. If you would like for your care team to wear a mask while they are taking care of you, please let them know. You may have one support person who is at least 64 years old accompany you for your appointments.

## 2023-10-09 ENCOUNTER — Telehealth: Payer: Self-pay | Admitting: Pharmacist

## 2023-10-09 NOTE — Telephone Encounter (Signed)
 Clinical Pharmacist Practitioner Encounter   Received new prescription for Cabometyx  (cabozantinib) for the treatment of metastatic clear-cell renal cell carcinoma  in, planned duration until disease progression or unacceptable drug toxicity.  CMP from 10/07/23 assessed, AST only slightly elevated continue to monitor. Prescription dose and frequency assessed.   Current medication list in Epic reviewed, no DDIs with cabozantinib identified.   Evaluated chart and no patient barriers to medication adherence identified.   Prescription has been e-scribed to the [facility] for benefits analysis and approval.  Oral Oncology Clinic will continue to follow for insurance authorization, copayment issues, initial counseling and start date.   Tanaya Dunigan N. Avaya Mcjunkins, PharmD, BCPS, BCOP, CPP Hematology/Oncology Clinical Pharmacist Practitioner [place] [phone]  10/09/2023 10:48 AM

## 2023-10-10 ENCOUNTER — Inpatient Hospital Stay: Payer: 59 | Attending: Hematology

## 2023-10-10 VITALS — BP 127/61 | HR 96 | Temp 97.0°F | Resp 18

## 2023-10-10 DIAGNOSIS — N189 Chronic kidney disease, unspecified: Secondary | ICD-10-CM | POA: Insufficient documentation

## 2023-10-10 DIAGNOSIS — C641 Malignant neoplasm of right kidney, except renal pelvis: Secondary | ICD-10-CM | POA: Diagnosis present

## 2023-10-10 DIAGNOSIS — D631 Anemia in chronic kidney disease: Secondary | ICD-10-CM | POA: Diagnosis not present

## 2023-10-10 DIAGNOSIS — I129 Hypertensive chronic kidney disease with stage 1 through stage 4 chronic kidney disease, or unspecified chronic kidney disease: Secondary | ICD-10-CM | POA: Insufficient documentation

## 2023-10-10 DIAGNOSIS — D509 Iron deficiency anemia, unspecified: Secondary | ICD-10-CM

## 2023-10-10 MED ORDER — ACETAMINOPHEN 325 MG PO TABS
650.0000 mg | ORAL_TABLET | Freq: Once | ORAL | Status: AC
Start: 2023-10-10 — End: 2023-10-10
  Administered 2023-10-10: 650 mg via ORAL
  Filled 2023-10-10: qty 2

## 2023-10-10 MED ORDER — SODIUM CHLORIDE 0.9% IV SOLUTION
250.0000 mL | INTRAVENOUS | Status: DC
  Administered 2023-10-10: 250 mL via INTRAVENOUS

## 2023-10-10 MED ORDER — HEPARIN SOD (PORK) LOCK FLUSH 100 UNIT/ML IV SOLN
500.0000 [IU] | Freq: Every day | INTRAVENOUS | Status: DC | PRN
Start: 2023-10-10 — End: 2023-10-10

## 2023-10-10 MED ORDER — SODIUM CHLORIDE 0.9% FLUSH: 10.0000 mL | INTRAVENOUS | Status: DC | PRN

## 2023-10-10 MED ORDER — DIPHENHYDRAMINE HCL 25 MG PO CAPS
25.0000 mg | ORAL_CAPSULE | Freq: Four times a day (QID) | ORAL | Status: DC | PRN
Start: 2023-10-10 — End: 2023-10-10
  Administered 2023-10-10: 25 mg via ORAL
  Filled 2023-10-10: qty 1

## 2023-10-10 NOTE — Progress Notes (Signed)
 Patient presents today for 2 units of PRBCs.  Patient tolerated therapy with no complaints voiced. Side effects with management reviewed with understanding verbalized. Port site clean and dry with no bruising or swelling noted at site. Good blood return noted before and after administration of therapy. Band aid applied. Patient left in satisfactory condition with VSS and no s/s of distress noted.

## 2023-10-10 NOTE — Patient Instructions (Signed)
 [facility] [place] - A DEPT OF [facility]  Discharge Instructions: Thank you for choosing [facility] to provide your oncology and hematology care.  If you have a lab appointment with the Cancer Center - please note that after April 8th, 2024, all labs will be drawn in the cancer center.  You do not have to check in or register with the main entrance as you have in the past but will complete your check-in in the cancer center.  Wear comfortable clothing and clothing appropriate for easy access to any Portacath or PICC line.   We strive to give you quality time with your provider. You may need to reschedule your appointment if you arrive late (15 or more minutes).  Arriving late affects you and other patients whose appointments are after yours.  Also, if you miss three or more appointments without notifying the office, you may be dismissed from the clinic at the provider's discretion.      For prescription refill requests, have your pharmacy contact our office and allow 72 hours for refills to be completed.    Today you received the following 2 units of PRBCs, return as scheduled.   To help prevent nausea and vomiting after your treatment, we encourage you to take your nausea medication as directed.  BELOW ARE SYMPTOMS THAT SHOULD BE REPORTED IMMEDIATELY: *FEVER GREATER THAN 100.4 F (38 C) OR HIGHER *CHILLS OR SWEATING *NAUSEA AND VOMITING THAT IS NOT CONTROLLED WITH YOUR NAUSEA MEDICATION *UNUSUAL SHORTNESS OF BREATH *UNUSUAL BRUISING OR BLEEDING *URINARY PROBLEMS (pain or burning when urinating, or frequent urination) *BOWEL PROBLEMS (unusual diarrhea, constipation, pain near the anus) TENDERNESS IN MOUTH AND THROAT WITH OR WITHOUT PRESENCE OF ULCERS (sore throat, sores in mouth, or a toothache) UNUSUAL RASH, SWELLING OR PAIN  UNUSUAL VAGINAL DISCHARGE OR ITCHING   Items with * indicate a potential emergency and should be followed up as soon as  possible or go to the Emergency Department if any problems should occur.  Please show the CHEMOTHERAPY ALERT CARD or IMMUNOTHERAPY ALERT CARD at check-in to the Emergency Department and triage nurse.  Should you have questions after your visit or need to cancel or reschedule your appointment, please contact [facility] [place] - A DEPT OF [name] [phone]  and follow the prompts.  Office hours are 8:00 a.m. to 4:30 p.m. Monday - Friday. Please note that voicemails left after 4:00 p.m. may not be returned until the following business day.  We are closed weekends and major holidays. You have access to a nurse at all times for urgent questions. Please call the main number to the clinic [phone] and follow the prompts.  For any non-urgent questions, you may also contact your provider using MyChart. We now offer e-Visits for anyone 51 and older to request care online for non-urgent symptoms. For details visit [email or web address].   Also download the MyChart app! Go to the app store, search MyChart, open the app, select [place], and log in with your MyChart username and password.

## 2023-10-11 LAB — TYPE AND SCREEN
ABO/RH(D): O POS
Antibody Screen: NEGATIVE
Unit division: 0
Unit division: 0

## 2023-10-11 LAB — BPAM RBC
Blood Product Expiration Date: 202501192359
Blood Product Expiration Date: 202501202359
ISSUE DATE / TIME: 202501030852
ISSUE DATE / TIME: 202501031037
Unit Type and Rh: 5100
Unit Type and Rh: 5100

## 2023-10-13 ENCOUNTER — Other Ambulatory Visit (HOSPITAL_COMMUNITY): Payer: Self-pay

## 2023-10-13 NOTE — Telephone Encounter (Addendum)
 Oral Oncology Patient Advocate Encounter  Prior Authorization for Cabometyx  has been approved.    PA# [id number]  Effective dates: 10/13/23 through 10/12/24  Patient must fill through [facility] per insurance.   Script and all supporting documents sent to [facility] for processing and fulfillment.    Morene Potters, CPhT Oncology Pharmacy Patient Advocate  [facility]  [phone] (phone) [phone] (fax) 10/13/2023 2:02 PM

## 2023-10-13 NOTE — Telephone Encounter (Signed)
 Received notification that Prior Authorization Request needed to go through another [facility] Processor. Called [employer] at [phone] and submitted Urgent Prior Authorization Request verbally. Clinical Chart Notes faxed to [phone].   PA# [id number]  Status is Pending.    Morene Potters, CPhT Oncology Pharmacy Patient Advocate  [facility]  [phone] (phone) [phone] (fax) 10/13/2023 8:57 AM

## 2023-10-14 ENCOUNTER — Telehealth: Payer: Self-pay

## 2023-10-14 NOTE — Telephone Encounter (Signed)
 Oral Oncology Patient Advocate Encounter   Was successful in obtaining a copay card for Cabometyx .  This copay card will make the patients copay ~$0.00.  The billing information is as follows and has been shared with [facility].   RxBin: [id number] PCN: Loyalty Member ID: [id number] Group ID: [id number]   Morene Potters, CPhT Oncology Pharmacy Patient Advocate  [facility]  [phone] (phone) [phone] (fax) 10/14/2023 9:02 AM

## 2023-10-17 ENCOUNTER — Other Ambulatory Visit: Payer: Self-pay | Admitting: Hematology

## 2023-10-17 MED ORDER — FUROSEMIDE 20 MG PO TABS: 20.0000 mg | ORAL_TABLET | Freq: Every day | ORAL | 1 refills | Status: DC | PRN

## 2023-10-17 NOTE — Addendum Note (Signed)
 Addended by: Doreatha Massed on: 10/17/2023 10:29 AM   Modules accepted: Orders

## 2023-10-20 NOTE — Telephone Encounter (Signed)
 Per [facility], medication set for delivery on 10/21/23, copay $0.

## 2023-10-21 ENCOUNTER — Ambulatory Visit (HOSPITAL_COMMUNITY): Payer: 59 | Admitting: Psychiatry

## 2023-10-21 NOTE — Progress Notes (Unsigned)
 Therapist began visit with patient but was unable to complete as pt was not feeling well. Therapist and pt agreed to cancel visit.

## 2023-10-22 ENCOUNTER — Inpatient Hospital Stay: Payer: 59 | Admitting: Licensed Clinical Social Worker

## 2023-10-22 DIAGNOSIS — C641 Malignant neoplasm of right kidney, except renal pelvis: Secondary | ICD-10-CM

## 2023-10-22 NOTE — Progress Notes (Signed)
 [facility] CSW Progress Note  Clinical Child psychotherapist  contacted pt regarding Warehouse manager.  CSW answered all questions pt had regarding Advanced Directives and provided contact details for CSW to book a time to complete and notarize the document if desired.        Deanna Bruns, LCSW Clinical Social Worker [facility]

## 2023-10-27 ENCOUNTER — Inpatient Hospital Stay: Payer: 59 | Admitting: Hematology

## 2023-10-27 ENCOUNTER — Inpatient Hospital Stay: Payer: 59

## 2023-10-27 NOTE — Progress Notes (Incomplete)
[facility] [address], [place] [address]    Clinic Day:  10/27/23   Referring physician: Kerri Perches, MD  Patient Care Team: Kerri Perches, MD as PCP - General Mallipeddi, Orion Modest, MD as PCP - Cardiology (Cardiology) Doreatha Massed, MD as Medical Oncologist (Medical Oncology)   ASSESSMENT & PLAN:   Assessment: 1. Metastatic clear-cell renal cell carcinoma: - Right radical nephrectomy on 02/06/2021. - Pathology shows clear-cell RCC, grade 4, 12.5 cm, necrosis and rhabdoid features are identified.  Tumor extends into the and invades the wall of the vena cava (pT3c).  Vascular, ureteral and all margins of resection are negative for tumor. - CT scan abdomen with and without contrast on 05/29/2021 showed several nodules along the peritoneal surface of the right nephrectomy bed warranting close attention. - CTAP with and without contrast on 09/18/2021 showed progressive nodularity within the right nephrectomy bed, with enlarged nodules adjacent to the right hepatic lobe extending inferiorly along the right retroperitoneum with multiple enlarged enhancing nodules.  Direct metastatic invasion into the right hepatic lobe.  Small nodule at the right lung base favored to be benign. - IMDC: Intermediate risk group with 2 features including diagnosis less than 1 year and hemoglobin lower than normal. - Bone scan on 10/05/2021 with focal activity in the mental vertex of the mandible which could be inflammatory/traumatic/metastatic. - CT chest on 10/03/2021 showed several lung nodules scattered bilaterally some of which could be metastatic and many others could be due to mucoid impaction. - NGS testing did not show any targetable mutations.  TMB was low.  CCND3 amplified.  PBRM1 VUS present. - Right nephrectomy bed biopsy (10/22/2021): Clear-cell renal cell carcinoma with necrosis - 4 cycles of ipilimumab and nivolumab from 11/19/2021 through 01/24/2022 - CT CAP  on 02/14/2022: Numerous new and enlarged lung nodules.  Significant interval increase in the mass in the right renal bed.  New hypodense liver lesions. - Lenvatinib (12 mg) and everolimus 5 mg daily started on 03/13/2022, discontinued on 07/22/2023 due to progression. - Belzutifan 120 mg daily started on 07/29/2023   2. Social/family history: - She lives at home with her husband and also takes care of her mother. - She works as a Child psychotherapist at [employer].  She quit smoking in 2004 and smoked half pack per day for 10 years. - Half sister (same mother) had kidney cancer at age 54.  Maternal grandfather had kidney cancer.  Sister died of lung cancer.  2 half sisters (same father) had breast cancers.  3.  Left breast stage I tubular adenocarcinoma: - She underwent left mastectomy on 06/11/2002.  0/6 lymph nodes positive.  ER 90%, PR 92%, Ki-67 6%, HER2 negative.  As per chart, declined antiestrogen therapy.    Plan: 1. Metastatic clear-cell RCC, intermediate risk by IMDC: - She is tolerating belzutifan very well. - Reviewed CT CAP from 09/29/2023: Massive increase in metastatic disease with new areas. - We have discussed best supportive care in the form of hospice.  She is not ready for it yet.  We will refer to palliative care.  Will start her on cabozantinib at 40 mg daily.  We discussed side effects and detail including hypotension, diarrhea, fatigue among others. - Will get baseline labs today.  Will reevaluate her in 2 to 3 weeks.  2.  Normocytic anemia: - Normocytic anemia from chronic inflammation, CKD and belzutifan. - Last hemoglobin was 6.9 on 09/29/2023 and she received 2 units  PRBC.  Will check CBC today to see if she needs any transfusion.  3.  Hypothyroidism: - She will continue Synthroid 125 mcg daily.   4.  Hypertension: - She continues to be off of olmesartan/HCTZ.  Blood pressure today is 138/76.  Will closely monitor after starting cabozantinib.  5.   Constipation: - Continue stool softener daily as needed when she takes pain medication.  6.  Right loin pain: - She has a right loin mass with occasional pain.  She reported that she took hydrocodone today after 3 weeks.  She reported pain as 4 out of 10.    No orders of the defined types were placed in this encounter.     Alben Deeds Teague,acting as a Neurosurgeon for [employer], MD.,have documented all relevant documentation on the behalf of Doreatha Massed, MD,as directed by  Doreatha Massed, MD while in the presence of Doreatha Massed, MD.  ***     Lissa Hoard R Teague   1/20/20259:40 AM  CHIEF COMPLAINT:   Diagnosis: metastatic clear-cell renal cell carcinoma    Cancer Staging  Malignant neoplasm of female breast [facility]) Staging form: Breast, AJCC 6th Edition - Clinical: Stage I (T1b, N0, M0) - Signed by Ellouise Newer, PA on 08/16/2011  Renal cell cancer [facility]) Staging form: Kidney, AJCC 8th Edition - Clinical stage from 09/26/2021: Stage IV (cT3c, cNX, cM1) - Unsigned    Prior Therapy: Nivolumab plus ipilimumab for 4 cycles with progression, lenvatinib and everolimus  Current Therapy: Belzutifan   HISTORY OF PRESENT ILLNESS:   Oncology History  Renal cell cancer (HCC)  06/20/2021 Initial Diagnosis   Renal cell carcinoma of right kidney (HCC)   11/19/2021 - 01/24/2022 Chemotherapy   Patient is on Treatment Plan : RENAL CELL CARCINOMA Nivolumab + Ipilimumab q21d / Nivolumab q28d      Genetic Testing   Negative genetic testing. No pathogenic variants identified on the [facility] Multi-Cancer Panel + RNA. VUS in PMS2 called c.1732C>A and in RECQL4 called c.2967G>A  Identified. The report date is 11/01/2021.   The Multi-Cancer Panel + RNA offered by [facility] includes sequencing and/or deletion duplication testing of the following 84 genes: AIP, ALK, APC, ATM, AXIN2,BAP1,  BARD1, BLM, BMPR1A, BRCA1, BRCA2, BRIP1, CASR, CDC73, CDH1, CDK4, CDKN1B, CDKN1C, CDKN2A  (p14ARF), CDKN2A (p16INK4a), CEBPA, CHEK2, CTNNA1, DICER1, DIS3L2, EGFR (c.2369C>T, p.Thr790Met variant only), EPCAM (Deletion/duplication testing only), FH, FLCN, GATA2, GPC3, GREM1 (Promoter region deletion/duplication testing only), HOXB13 (c.251G>A, p.Gly84Glu), HRAS, KIT, MAX, MEN1, MET, MITF (c.952G>A, p.Glu318Lys variant only), MLH1, MSH2, MSH3, MSH6, MUTYH, NBN, NF1, NF2, NTHL1, PALB2, PDGFRA, PHOX2B, PMS2, POLD1, POLE, POT1, PRKAR1A, PTCH1, PTEN, RAD50, RAD51C, RAD51D, RB1, RECQL4, RET, RUNX1, SDHAF2, SDHA (sequence changes only), SDHB, SDHC, SDHD, SMAD4, SMARCA4, SMARCB1, SMARCE1, STK11, SUFU, TERC, TERT, TMEM127, TP53, TSC1, TSC2, VHL, WRN and WT1.       INTERVAL HISTORY:   Deanna Bush is a 63 y.o. female presenting to clinic today for follow up of metastatic clear-cell renal cell carcinoma. She was last seen by me on 10/07/23.  Today, she states that she is doing well overall. Her appetite level is at ***%. Her energy level is at ***%.   PAST MEDICAL HISTORY:   Past Medical History: Past Medical History:  Diagnosis Date   Adenocarcinoma of breast (HCC)    left    Cellulitis of leg, left    Complication of anesthesia    Hard to wake up   Depression    Diabetes mellitus without complication (HCC)  Pt denies   Family history of breast cancer 08/16/2011   Family history of breast cancer    Family history of colon cancer    Family history of kidney cancer    Family history of prostate cancer    Hyperlipidemia    Hypertension    Hypothyroidism    MRSA (methicillin resistant staph aureus) culture positive 08/19/2011   Pre-diabetes     Surgical History: Past Surgical History:  Procedure Laterality Date   BREAST SURGERY Left    mastectomy   CATARACT EXTRACTION W/PHACO Right 06/06/2023   Procedure: CATARACT EXTRACTION PHACO AND INTRAOCULAR LENS PLACEMENT (IOC);  Surgeon: Fabio Pierce, MD;  Location: [facility];  Service: Ophthalmology;  Laterality: Right;  CDE 5.61   CATARACT  EXTRACTION W/PHACO Left 07/21/2023   Procedure: CATARACT EXTRACTION PHACO AND INTRAOCULAR LENS PLACEMENT (IOC);  Surgeon: Fabio Pierce, MD;  Location: [facility];  Service: Ophthalmology;  Laterality: Left;  CDE: 5.96   CESAREAN SECTION     x2   COLONOSCOPY N/A 03/03/2020   Procedure: COLONOSCOPY;  Surgeon: Corbin Ade, MD;  Location: [facility];  Service: Endoscopy;  Laterality: N/A;  12:00   IR IMAGING GUIDED PORT INSERTION  11/07/2021   left mastectomy     NEPHRECTOMY Right 02/06/2021   Procedure: NEPHRECTOMY- open radical;  Surgeon: Malen Gauze, MD;  Location: [facility];  Service: Urology;  Laterality: Right;   POLYPECTOMY  03/03/2020   Procedure: POLYPECTOMY;  Surgeon: Corbin Ade, MD;  Location: [facility];  Service: Endoscopy;;    Social History: Social History   Socioeconomic History   Marital status: Married    Spouse name: Not on file   Number of children: 2   Years of education: Not on file   Highest education level: GED or equivalent  Occupational History   Occupation: CNA  Tobacco Use   Smoking status: Former    Current packs/day: 0.00    Average packs/day: 0.5 packs/day for 18.0 years (9.0 ttl pk-yrs)    Types: Cigarettes    Start date: 07/01/1984    Quit date: 07/01/2002    Years since quitting: 21.3   Smokeless tobacco: Never  Vaping Use   Vaping status: Never Used  Substance and Sexual Activity   Alcohol use: No   Drug use: No   Sexual activity: Yes    Birth control/protection: None  Other Topics Concern   Not on file  Social History Narrative   Not on file   Social Drivers of Health   Financial Resource Strain: Medium Risk (08/14/2023)   Overall Financial Resource Strain (CARDIA)    Difficulty of Paying Living Expenses: Somewhat hard  Food Insecurity: No Food Insecurity (08/14/2023)   Hunger Vital Sign    Worried About Running Out of Food in the Last Year: Never true    Ran Out of Food in the Last Year: Never true  Transportation Needs:  No Transportation Needs (08/14/2023)   PRAPARE - Administrator, Civil Service (Medical): No    Lack of Transportation (Non-Medical): No  Physical Activity: Insufficiently Active (08/14/2023)   Exercise Vital Sign    Days of Exercise per Week: 1 day    Minutes of Exercise per Session: 10 min  Stress: No Stress Concern Present (08/14/2023)   [employer] of Occupational Health - Occupational Stress Questionnaire    Feeling of Stress : Only a little  Social Connections: Moderately Integrated (08/14/2023)   Social Connection and Isolation Panel [NHANES]  Frequency of Communication with Friends and Family: Twice a week    Frequency of Social Gatherings with Friends and Family: More than three times a week    Attends Religious Services: More than 4 times per year    Active Member of [employer] or Organizations: Yes    Attends Engineer, structural: More than 4 times per year    Marital Status: Separated  Intimate Partner Violence: Not on file    Family History: Family History  Problem Relation Age of Onset   Alcohol abuse Mother    Hypertension Mother    Diabetes Mother    Hyperlipidemia Mother    Colon cancer Maternal Aunt        dx 69s   Prostate cancer Maternal Uncle    Throat cancer Maternal Uncle    Kidney cancer Maternal Grandfather    Lung cancer Half-Sister    Kidney cancer Half-Sister 33   Breast cancer Half-Sister 71   Breast cancer Half-Sister 36    Current Medications:  Current Outpatient Medications:    acetaminophen (TYLENOL) 500 MG tablet, Take 500 mg by mouth every 6 (six) hours as needed for moderate pain or headache., Disp: , Rfl:    atorvastatin (LIPITOR) 40 MG tablet, Take 1 tablet (40 mg total) by mouth daily., Disp: 90 tablet, Rfl: 1   belzutifan (WELIREG) 40 MG tablet, Take 3 tablets (120 mg total) by mouth daily., Disp: 90 tablet, Rfl: 1   benzonatate (TESSALON) 100 MG capsule, Take 1 capsule (100 mg total) by mouth 2 (two) times daily  as needed for cough., Disp: 20 capsule, Rfl: 0   cabozantinib (CABOMETYX) 40 MG tablet, Take 1 tablet (40 mg total) by mouth daily. Take on an empty stomach, 1 hour before or 2 hours after meals., Disp: 30 tablet, Rfl: 1   EPINEPHrine 0.3 mg/0.3 mL IJ SOAJ injection, Inject 0.3 mg into the muscle as needed for anaphylaxis., Disp: 2 each, Rfl: 0   furosemide (LASIX) 20 MG tablet, TAKE 1 TABLET(20 MG) BY MOUTH DAILY AS NEEDED, Disp: 90 tablet, Rfl: 1   HYDROcodone-acetaminophen (NORCO) 5-325 MG tablet, Take 1 tablet by mouth every 6 (six) hours as needed for moderate pain (pain score 4-6)., Disp: 30 tablet, Rfl: 0   levothyroxine (SYNTHROID) 125 MCG tablet, Take 1 tablet (125 mcg total) by mouth daily before breakfast., Disp: 90 tablet, Rfl: 1   Multiple Vitamin (MULTIVITAMIN WITH MINERALS) TABS tablet, Take 1 tablet by mouth daily., Disp: , Rfl:    olmesartan-hydrochlorothiazide (BENICAR HCT) 40-25 MG tablet, TAKE 1 TABLET BY MOUTH DAILY, Disp: 30 tablet, Rfl: 1   UNABLE TO FIND, MASTECTOMY BRA AND PROTHESIS DX Z90.12, Disp: 6 each, Rfl: 2   UNABLE TO FIND, 6 mastectomy prosthesis and bras., Disp: 6 each, Rfl: 0   urea (CARMOL) 10 % cream, Apply topically 2 (two) times daily. Apply twice daily to hands, Disp: 71 g, Rfl: 0 No current facility-administered medications for this visit.  Facility-Administered Medications Ordered in Other Visits:    heparin lock flush 100 unit/mL, 500 Units, Intravenous, Once, Doreatha Massed, MD   Allergies: Allergies  Allergen Reactions   Amlodipine Swelling    Leg edema   Sulfonamide Derivatives Itching   Yellow Jacket Venom [Bee Venom] Rash    urticaria    REVIEW OF SYSTEMS:   Review of Systems  Constitutional:  Negative for chills, fatigue and fever.  HENT:   Negative for lump/mass, mouth sores, nosebleeds, sore throat and trouble swallowing.  Eyes:  Negative for eye problems.  Respiratory:  Negative for cough and shortness of breath.    Cardiovascular:  Negative for chest pain, leg swelling and palpitations.  Gastrointestinal:  Negative for abdominal pain, constipation, diarrhea, nausea and vomiting.  Genitourinary:  Negative for bladder incontinence, difficulty urinating, dysuria, frequency, hematuria and nocturia.   Musculoskeletal:  Negative for arthralgias, back pain, flank pain, myalgias and neck pain.  Skin:  Negative for itching and rash.  Neurological:  Negative for dizziness, headaches and numbness.  Hematological:  Does not bruise/bleed easily.  Psychiatric/Behavioral:  Negative for depression, sleep disturbance and suicidal ideas. The patient is not nervous/anxious.   All other systems reviewed and are negative.    VITALS:   There were no vitals taken for this visit.  Wt Readings from Last 3 Encounters:  09/09/23 162 lb 7.7 oz (73.7 kg)  08/14/23 151 lb 1.3 oz (68.5 kg)  08/12/23 149 lb 9.6 oz (67.9 kg)    There is no height or weight on file to calculate BMI.  Performance status (ECOG): 0 - Asymptomatic  PHYSICAL EXAM:   Physical Exam Vitals and nursing note reviewed. Exam conducted with a chaperone present.  Constitutional:      Appearance: Normal appearance.  Cardiovascular:     Rate and Rhythm: Normal rate and regular rhythm.     Pulses: Normal pulses.     Heart sounds: Normal heart sounds.  Pulmonary:     Effort: Pulmonary effort is normal.     Breath sounds: Normal breath sounds.  Abdominal:     Palpations: Abdomen is soft. There is no hepatomegaly, splenomegaly or mass.     Tenderness: There is no abdominal tenderness.  Musculoskeletal:     Right lower leg: No edema.     Left lower leg: No edema.  Lymphadenopathy:     Cervical: No cervical adenopathy.     Right cervical: No superficial, deep or posterior cervical adenopathy.    Left cervical: No superficial, deep or posterior cervical adenopathy.     Upper Body:     Right upper body: No supraclavicular or axillary adenopathy.      Left upper body: No supraclavicular or axillary adenopathy.  Neurological:     General: No focal deficit present.     Mental Status: She is alert and oriented to person, place, and time.  Psychiatric:        Mood and Affect: Mood normal.        Behavior: Behavior normal.     LABS:      Latest Ref Rng & Units 10/07/2023    3:20 PM 09/29/2023    8:12 AM 09/09/2023   11:53 AM  CBC  WBC 4.0 - 10.5 K/uL 6.8  6.8  7.0   Hemoglobin 12.0 - 15.0 g/dL 7.6  6.9  8.2   Hematocrit 36.0 - 46.0 % 23.4  21.3  26.9   Platelets 150 - 400 K/uL 297  368  279       Latest Ref Rng & Units 10/07/2023    3:20 PM 09/29/2023    8:12 AM 09/09/2023   11:53 AM  CMP  Glucose 70 - 99 mg/dL 99  657  846   BUN 8 - 23 mg/dL 32  28  29   Creatinine 0.44 - 1.00 mg/dL 9.62  9.52  8.41   Sodium 135 - 145 mmol/L 127  129  133   Potassium 3.5 - 5.1 mmol/L 5.1  5.3  4.4   Chloride  98 - 111 mmol/L 94  92  95   CO2 22 - 32 mmol/L 24  26  25    Calcium 8.9 - 10.3 mg/dL 9.0  9.5  9.6   Total Protein 6.5 - 8.1 g/dL 6.8  7.2  7.4   Total Bilirubin 0.0 - 1.2 mg/dL 0.5  0.4  0.3   Alkaline Phos 38 - 126 U/L 291  275  177   AST 15 - 41 U/L 65  52  63   ALT 0 - 44 U/L 23  26  34      No results found for: "CEA1", "CEA" / No results found for: "CEA1", "CEA" No results found for: "PSA1" No results found for: "QMV784" No results found for: "CAN125"  No results found for: "TOTALPROTELP", "ALBUMINELP", "A1GS", "A2GS", "BETS", "BETA2SER", "GAMS", "MSPIKE", "SPEI" Lab Results  Component Value Date   TIBC 284 08/26/2023   TIBC 308 07/15/2023   TIBC 307 04/29/2023   FERRITIN 272 08/26/2023   FERRITIN 180 07/15/2023   FERRITIN 152 04/29/2023   IRONPCTSAT 17 08/26/2023   IRONPCTSAT 21 07/15/2023   IRONPCTSAT 20 04/29/2023   Lab Results  Component Value Date   LDH 178 07/15/2023   LDH 178 02/21/2022   LDH 152 09/27/2021     STUDIES:   CT CHEST ABDOMEN PELVIS W CONTRAST Result Date: 10/07/2023 CLINICAL DATA:   Metastatic renal cell carcinoma. * Tracking Code: BO * EXAM: CT CHEST, ABDOMEN, AND PELVIS WITH CONTRAST TECHNIQUE: Multidetector CT imaging of the chest, abdomen and pelvis was performed following the standard protocol during bolus administration of intravenous contrast. RADIATION DOSE REDUCTION: This exam was performed according to the departmental dose-optimization program which includes automated exposure control, adjustment of the mA and/or kV according to patient size and/or use of iterative reconstruction technique. CONTRAST:  80mL OMNIPAQUE IOHEXOL 300 MG/ML  SOLN COMPARISON:  07/15/2023.  And older FINDINGS: CT CHEST FINDINGS Cardiovascular: Right IJ chest port. Port is accessed. Tip seen as far as the right atrium. Developing, small pericardial effusion. Heart itself is nonenlarged. Thoracic aorta has a normal course and caliber with mild atherosclerotic calcified plaque. Mediastinum/Nodes: Small thyroid gland. Normal caliber thoracic esophagus. Marked increase in mediastinal nodal enlargement. The node in the AP window region which previously has a short axis of 10 mm, today on series 2, image 19 measures 26 mm in short axis. Additional abnormal node along the margin left hilum on image 22 with short axis of 19 mm. Subcarinal node on the right side on series 2, image 25 is new with short axis of 21 mm. No abnormal axillary nodal enlargement. Left axillary surgical clips are identified. Again changes of left mastectomy. Abnormal right hilar nodes are also suspected such as inferiorly on series 2 image 27. Prominent supraclavicular left-sided nodes identified at the edge of the imaging field on series 2, image 4. Lungs/Pleura: Increasing small right pleural effusion and tiny left. Increasing pulmonary nodules identified. The anterior left upper lobe nodule which previously measured 8 x 6 mm, today on series 3, image 25 measures 2.7 by 2.2 cm. New nodule just lateral and posterior to this on image 26  measures 2.8 by 1.9 cm. New lesion towards the lingula on series 3, image 57 measures 4.8 by 2.2 cm. There are several other small nodules scattered in both lungs there is also areas of pleural nodularity along the inferior right lower lobe such as series 2 image 35 which could represent pleural metastases. Adjacent consolidative lung opacity in  the right lower lobe with some air bronchograms. Musculoskeletal: Scattered degenerative changes. CT ABDOMEN PELVIS FINDINGS Hepatobiliary: Increasing hepatic metastases are identified. Lesion seen in segment 4 which previously measured 2.6 x 2.1 cm, today measures 5.2 by 5.9 cm on series 2, image 62. Multiple liver lesions are seen scattered in the liver, clearly enlarged. Portal vein is grossly patent but narrowed of the hilum related to adjacent mass effect from lesions. Pancreas: Unremarkable. No pancreatic ductal dilatation or surrounding inflammatory changes. Spleen: Normal in size without focal abnormality. Adrenals/Urinary Tract: Left adrenal gland is slightly thickened. No enhancing left-sided renal mass or collecting system dilatation. On delayed there is delayed excretion of contrast from the kidney. Preserved contours of the urinary bladder. The right kidney is not seen. Please correlate with the prior surgery. The right adrenal gland is not seen. The large confluence mass in the surgical bed is increased in size significantly with the extension through the posterior aspect of the abdomen into the subcutaneous fat and musculature of the back. Is also involvement of the twelfth rib with progressive destruction and absence of bone material. Measurement estimated at 15.9 by 11.5 cm on series 2, image 56. Previously estimated at 11.7 by 3.1 cm. There is more extension into the hilum with mass effect along liver hilar structures. There is obscuration of the IVC with likely involvement of the IVC itself. Tumor in vein is possible. The component extending into the  musculature posterolaterally previously measured 4.8 x 3.0 cm and today on series 2, image 62 7.6 by 4.1 cm. Stomach/Bowel: On this non oral contrast exam the visualized bowel is nondilated. Scattered stool. Abnormal soft tissue nodules extend along the right pericolic gutter and again are increased. Previously the area was measured at 4.5 x 2.3 cm and today on series 2, image 73 9.3 by 6.4 cm. Vascular/Lymphatic: Normal caliber aorta with scattered vascular calcifications. 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 Numerous collaterals identified with increasing anasarca and mesenteric stranding. Several abnormal nodes identified throughout the retroperitoneum which are increased. Example left para-aortic on series 2, image 61 measures 2.4 x 1.7 cm. Reproductive: Uterus is present. There is a mass lesion which is new to the right and superior of the uterus. This could be an adnexal or ovarian metastasis measuring 4.8 by 5.4 cm on series 2, image 94. Other: Mass lesion in the mesentery posteriorly on the right side along the surface of the psoas muscle which previously measured 3.3 x 3.2 cm, today on series 2, image 79 measures 7.6 by 6.6 cm. Musculoskeletal: Curvature and degenerative changes along the spine. Again there is destruction of the right twelfth rib. IMPRESSION: Massive increase in metastatic disease. This includes both previously seen disease and new areas of disease. Extensive increase in lung nodules, liver lesions. Marked increase in abnormal soft tissue involving the surgical bed with confluence extension now towards the midline and porta hepatis with mass effect along structures. There also increasing extension through the posterior aspect of the abdomen involving the right twelfth rib and underlying fat and musculature. Developing abnormal lymph nodes in the mediastinum, hila and retroperitoneum as well as possible supraclavicular on the left.  Increasing peritoneal carcinomatosis including pelvic lesion. Areas suggestive pleural metastasis on the right. Increasing pleural effusions, right-greater-than-left. Also new consolidation of the right lower lobe. Increasing pericardial effusion. Significant increase in edema of the soft tissues and mesenteric stranding. Delayed excretion of contrast from the left kidney. Please correlate  with patient's renal function. In addition overall the relative enhancement of the parenchymal organs is less than expected. Critical Value/emergent results were called by telephone at the time of interpretation on 10/07/2023 at 8:08 am to provider [facility] , who verbally acknowledged these results. Electronically Signed   By: Karen Kays M.D.   On: 10/07/2023 11:08

## 2023-10-30 ENCOUNTER — Inpatient Hospital Stay (HOSPITAL_BASED_OUTPATIENT_CLINIC_OR_DEPARTMENT_OTHER): Payer: 59 | Admitting: Hematology

## 2023-10-30 ENCOUNTER — Inpatient Hospital Stay: Payer: 59

## 2023-10-30 VITALS — BP 155/81 | HR 86 | Temp 97.4°F | Resp 18 | Ht 66.0 in | Wt 179.0 lb

## 2023-10-30 DIAGNOSIS — Z95828 Presence of other vascular implants and grafts: Secondary | ICD-10-CM

## 2023-10-30 DIAGNOSIS — C641 Malignant neoplasm of right kidney, except renal pelvis: Secondary | ICD-10-CM | POA: Diagnosis not present

## 2023-10-30 DIAGNOSIS — D509 Iron deficiency anemia, unspecified: Secondary | ICD-10-CM

## 2023-10-30 LAB — CBC WITH DIFFERENTIAL/PLATELET
Abs Immature Granulocytes: 0.03 10*3/uL (ref 0.00–0.07)
Basophils Absolute: 0 10*3/uL (ref 0.0–0.1)
Basophils Relative: 0 %
Eosinophils Absolute: 0.1 10*3/uL (ref 0.0–0.5)
Eosinophils Relative: 1 %
HCT: 36.3 % (ref 36.0–46.0)
Hemoglobin: 12.1 g/dL (ref 12.0–15.0)
Immature Granulocytes: 0 %
Lymphocytes Relative: 20 %
Lymphs Abs: 1.5 10*3/uL (ref 0.7–4.0)
MCH: 30.1 pg (ref 26.0–34.0)
MCHC: 33.3 g/dL (ref 30.0–36.0)
MCV: 90.3 fL (ref 80.0–100.0)
Monocytes Absolute: 0.4 10*3/uL (ref 0.1–1.0)
Monocytes Relative: 6 %
Neutro Abs: 5.6 10*3/uL (ref 1.7–7.7)
Neutrophils Relative %: 73 %
Platelets: 309 10*3/uL (ref 150–400)
RBC: 4.02 MIL/uL (ref 3.87–5.11)
RDW: 18.4 % — ABNORMAL HIGH (ref 11.5–15.5)
WBC: 7.6 10*3/uL (ref 4.0–10.5)
nRBC: 0 % (ref 0.0–0.2)

## 2023-10-30 LAB — COMPREHENSIVE METABOLIC PANEL
ALT: 26 U/L (ref 0–44)
AST: 79 U/L — ABNORMAL HIGH (ref 15–41)
Albumin: 2.6 g/dL — ABNORMAL LOW (ref 3.5–5.0)
Alkaline Phosphatase: 501 U/L — ABNORMAL HIGH (ref 38–126)
Anion gap: 14 (ref 5–15)
BUN: 17 mg/dL (ref 8–23)
CO2: 21 mmol/L — ABNORMAL LOW (ref 22–32)
Calcium: 8.9 mg/dL (ref 8.9–10.3)
Chloride: 97 mmol/L — ABNORMAL LOW (ref 98–111)
Creatinine, Ser: 0.96 mg/dL (ref 0.44–1.00)
GFR, Estimated: >60 mL/min (ref >60)
Glucose, Bld: 73 mg/dL (ref 70–99)
Potassium: 4.4 mmol/L (ref 3.5–5.1)
Sodium: 132 mmol/L — ABNORMAL LOW (ref 135–145)
Total Bilirubin: 0.7 mg/dL (ref 0.0–1.2)
Total Protein: 6.4 g/dL — ABNORMAL LOW (ref 6.5–8.1)

## 2023-10-30 LAB — MAGNESIUM: Magnesium: 1.9 mg/dL (ref 1.7–2.4)

## 2023-10-30 LAB — SAMPLE TO BLOOD BANK

## 2023-10-30 MED ORDER — SODIUM CHLORIDE FLUSH 0.9 % IV SOLN
10.0000 mL | Freq: Once | INTRAVENOUS | Status: AC
  Administered 2023-10-30: 10 mL via INTRAVENOUS
  Filled 2023-10-30: qty 10

## 2023-10-30 MED ORDER — HEPARIN SOD (PORK) LOCK FLUSH 100 UNIT/ML IV SOLN
500.0000 [IU] | Freq: Once | INTRAVENOUS | Status: AC
  Administered 2023-10-30: 500 [IU] via INTRAVENOUS

## 2023-10-30 NOTE — Patient Instructions (Signed)
[facility] [place] - A DEPT OF [facility]  Discharge Instructions: Thank you for choosing [facility] to provide your oncology and hematology care.  If you have a lab appointment with the Cancer Center - please note that after April 8th, 2024, all labs will be drawn in the cancer center.  You do not have to check in or register with the main entrance as you have in the past but will complete your check-in in the cancer center.  Wear comfortable clothing and clothing appropriate for easy access to any Portacath or PICC line.   We strive to give you quality time with your provider. You may need to reschedule your appointment if you arrive late (15 or more minutes).  Arriving late affects you and other patients whose appointments are after yours.  Also, if you miss three or more appointments without notifying the office, you may be dismissed from the clinic at the provider's discretion.      For prescription refill requests, have your pharmacy contact our office and allow 72 hours for refills to be completed.    Today you received the following port flush with lab draw, return as scheduled.   To help prevent nausea and vomiting after your treatment, we encourage you to take your nausea medication as directed.  BELOW ARE SYMPTOMS THAT SHOULD BE REPORTED IMMEDIATELY: *FEVER GREATER THAN 100.4 F (38 C) OR HIGHER *CHILLS OR SWEATING *NAUSEA AND VOMITING THAT IS NOT CONTROLLED WITH YOUR NAUSEA MEDICATION *UNUSUAL SHORTNESS OF BREATH *UNUSUAL BRUISING OR BLEEDING *URINARY PROBLEMS (pain or burning when urinating, or frequent urination) *BOWEL PROBLEMS (unusual diarrhea, constipation, pain near the anus) TENDERNESS IN MOUTH AND THROAT WITH OR WITHOUT PRESENCE OF ULCERS (sore throat, sores in mouth, or a toothache) UNUSUAL RASH, SWELLING OR PAIN  UNUSUAL VAGINAL DISCHARGE OR ITCHING   Items with * indicate a potential emergency and should be followed up as  soon as possible or go to the Emergency Department if any problems should occur.  Please show the CHEMOTHERAPY ALERT CARD or IMMUNOTHERAPY ALERT CARD at check-in to the Emergency Department and triage nurse.  Should you have questions after your visit or need to cancel or reschedule your appointment, please contact [facility] [place] - A DEPT OF [facility] [phone]  and follow the prompts.  Office hours are 8:00 a.m. to 4:30 p.m. Monday - Friday. Please note that voicemails left after 4:00 p.m. may not be returned until the following business day.  We are closed weekends and major holidays. You have access to a nurse at all times for urgent questions. Please call the main number to the clinic [phone] and follow the prompts.  For any non-urgent questions, you may also contact your provider using MyChart. We now offer e-Visits for anyone 7 and older to request care online for non-urgent symptoms. For details visit [email or web address].   Also download the MyChart app! Go to the app store, search "MyChart", open the app, select [place], and log in with your MyChart username and password.

## 2023-10-30 NOTE — Progress Notes (Signed)
[facility] [address], [place] [address]    Clinic Day:  11/05/23   Referring physician: Kerri Perches, MD  Patient Care Team: Kerri Perches, MD as PCP - General Mallipeddi, Orion Modest, MD as PCP - Cardiology (Cardiology) Doreatha Massed, MD as Medical Oncologist (Medical Oncology)   ASSESSMENT & PLAN:   Assessment: 1. Metastatic clear-cell renal cell carcinoma: - Right radical nephrectomy on 02/06/2021. - Pathology shows clear-cell RCC, grade 4, 12.5 cm, necrosis and rhabdoid features are identified.  Tumor extends into the and invades the wall of the vena cava (pT3c).  Vascular, ureteral and all margins of resection are negative for tumor. - CT scan abdomen with and without contrast on 05/29/2021 showed several nodules along the peritoneal surface of the right nephrectomy bed warranting close attention. - CTAP with and without contrast on 09/18/2021 showed progressive nodularity within the right nephrectomy bed, with enlarged nodules adjacent to the right hepatic lobe extending inferiorly along the right retroperitoneum with multiple enlarged enhancing nodules.  Direct metastatic invasion into the right hepatic lobe.  Small nodule at the right lung base favored to be benign. - IMDC: Intermediate risk group with 2 features including diagnosis less than 1 year and hemoglobin lower than normal. - Bone scan on 10/05/2021 with focal activity in the mental vertex of the mandible which could be inflammatory/traumatic/metastatic. - CT chest on 10/03/2021 showed several lung nodules scattered bilaterally some of which could be metastatic and many others could be due to mucoid impaction. - NGS testing did not show any targetable mutations.  TMB was low.  CCND3 amplified.  PBRM1 VUS present. - Right nephrectomy bed biopsy (10/22/2021): Clear-cell renal cell carcinoma with necrosis - 4 cycles of ipilimumab and nivolumab from 11/19/2021 through 01/24/2022 - CT CAP  on 02/14/2022: Numerous new and enlarged lung nodules.  Significant interval increase in the mass in the right renal bed.  New hypodense liver lesions. - Lenvatinib (12 mg) and everolimus 5 mg daily started on 03/13/2022, discontinued on 07/22/2023 due to progression. - Belzutifan 120 mg daily started on 07/29/2023, discontinued due to progression. - Cabozantinib 40 mg daily started on 10/20/2023.   2. Social/family history: - She lives at home with her husband and also takes care of her mother. - She works as a Child psychotherapist at [employer].  She quit smoking in 2004 and smoked half pack per day for 10 years. - Half sister (same mother) had kidney cancer at age 35.  Maternal grandfather had kidney cancer.  Sister died of lung cancer.  2 half sisters (same father) had breast cancers.  3.  Left breast stage I tubular adenocarcinoma: - She underwent left mastectomy on 06/11/2002.  0/6 lymph nodes positive.  ER 90%, PR 92%, Ki-67 6%, HER2 negative.  As per chart, declined antiestrogen therapy.    Plan: 1. Metastatic clear-cell RCC, intermediate risk by IMDC: - Cabozantinib 40 mg daily started on 10/20/2023. - She reports she is having 1 loose stool per day.  Denies any skin rash.  Blood pressure is 150/80. - Labs today: AST 79 slightly up from last time at 65.  Bilirubin normal.  Alk phos also elevated at 501 with albumin 2.6.  CBC was grossly normal. - Continue cabozantinib 40 mg daily.  RTC 2 weeks for follow-up.  2.  Normocytic anemia: - Previous transfusion dependent anemia from belzutifan. - Hemoglobin today improved to 12.1.  3.  Hypothyroidism: - Continue Synthroid 125 mcg daily.  Will check TSH at next visit.   4.  Hypertension: - Olmesartan/HCTZ on hold.  Blood pressure today increased to 155/80.  Will restart back on olmesartan and HCTZ.  5.  Right loin pain: - Right loin mass is stable. - She reports that she does not have pain and is not requiring hydrocodone.     Orders Placed This Encounter  Procedures   CBC with Differential    Standing Status:   Future    Expected Date:   11/13/2023    Expiration Date:   10/29/2024   Comprehensive metabolic panel    Standing Status:   Future    Expected Date:   11/13/2023    Expiration Date:   10/29/2024   Magnesium    Standing Status:   Future    Expected Date:   11/13/2023    Expiration Date:   10/29/2024   TSH    Standing Status:   Future    Expected Date:   11/13/2023    Expiration Date:   10/29/2024      Mikeal Hawthorne R Teague,acting as a scribe for Doreatha Massed, MD.,have documented all relevant documentation on the behalf of Doreatha Massed, MD,as directed by  Doreatha Massed, MD while in the presence of Doreatha Massed, MD.  I, Doreatha Massed MD, have reviewed the above documentation for accuracy and completeness, and I agree with the above.        Doreatha Massed, MD   1/29/20255:59 PM  CHIEF COMPLAINT:   Diagnosis: metastatic clear-cell renal cell carcinoma    Cancer Staging  Malignant neoplasm of female breast [facility]) Staging form: Breast, AJCC 6th Edition - Clinical: Stage I (T1b, N0, M0) - Signed by Ellouise Newer, PA on 08/16/2011  Renal cell cancer [facility]) Staging form: Kidney, AJCC 8th Edition - Clinical stage from 09/26/2021: Stage IV (cT3c, cNX, cM1) - Unsigned    Prior Therapy: Nivolumab plus ipilimumab for 4 cycles with progression, lenvatinib and everolimus, belzutifan  Current Therapy: Cabozantinib   HISTORY OF PRESENT ILLNESS:   Oncology History  Renal cell cancer (HCC)  06/20/2021 Initial Diagnosis   Renal cell carcinoma of right kidney (HCC)   11/19/2021 - 01/24/2022 Chemotherapy   Patient is on Treatment Plan : RENAL CELL CARCINOMA Nivolumab + Ipilimumab q21d / Nivolumab q28d      Genetic Testing   Negative genetic testing. No pathogenic variants identified on the [facility] Multi-Cancer Panel + RNA. VUS in PMS2 called c.1732C>A and in RECQL4  called c.2967G>A  Identified. The report date is 11/01/2021.   The Multi-Cancer Panel + RNA offered by [facility] includes sequencing and/or deletion duplication testing of the following 84 genes: AIP, ALK, APC, ATM, AXIN2,BAP1,  BARD1, BLM, BMPR1A, BRCA1, BRCA2, BRIP1, CASR, CDC73, CDH1, CDK4, CDKN1B, CDKN1C, CDKN2A (p14ARF), CDKN2A (p16INK4a), CEBPA, CHEK2, CTNNA1, DICER1, DIS3L2, EGFR (c.2369C>T, p.Thr790Met variant only), EPCAM (Deletion/duplication testing only), FH, FLCN, GATA2, GPC3, GREM1 (Promoter region deletion/duplication testing only), HOXB13 (c.251G>A, p.Gly84Glu), HRAS, KIT, MAX, MEN1, MET, MITF (c.952G>A, p.Glu318Lys variant only), MLH1, MSH2, MSH3, MSH6, MUTYH, NBN, NF1, NF2, NTHL1, PALB2, PDGFRA, PHOX2B, PMS2, POLD1, POLE, POT1, PRKAR1A, PTCH1, PTEN, RAD50, RAD51C, RAD51D, RB1, RECQL4, RET, RUNX1, SDHAF2, SDHA (sequence changes only), SDHB, SDHC, SDHD, SMAD4, SMARCA4, SMARCB1, SMARCE1, STK11, SUFU, TERC, TERT, TMEM127, TP53, TSC1, TSC2, VHL, WRN and WT1.       INTERVAL HISTORY:   Deanna Bush is a 63 y.o. female presenting to clinic today for follow up of metastatic clear-cell renal cell carcinoma. She was last seen by me on  10/07/23.  Today, she states that she is doing well overall. Her appetite level is at 100%. Her energy level is at 55%. She is accompanied by a family member.  She started cabozantinib on 10/20/23 and takes it as prescribed. She reports associated diarrhea 1 x a day with loose stools. She denies any skin rashes. She denies any back pain and has not taken pain medication for 3 weeks. She is not taking Synthroid, which she was first prescribed for hypothyroidism 30 years ago. Her appetite is healthy. She notes since being given fluid pills her edema in the lower extremities has significantly improved and she is able to walk without assistance.   PAST MEDICAL HISTORY:   Past Medical History: Past Medical History:  Diagnosis Date   Adenocarcinoma of breast (HCC)    left     Cellulitis of leg, left    Complication of anesthesia    Hard to wake up   Depression    Diabetes mellitus without complication (HCC)    Pt denies   Family history of breast cancer 08/16/2011   Family history of breast cancer    Family history of colon cancer    Family history of kidney cancer    Family history of prostate cancer    Hyperlipidemia    Hypertension    Hypothyroidism    MRSA (methicillin resistant staph aureus) culture positive 08/19/2011   Pre-diabetes     Surgical History: Past Surgical History:  Procedure Laterality Date   BREAST SURGERY Left    mastectomy   CATARACT EXTRACTION W/PHACO Right 06/06/2023   Procedure: CATARACT EXTRACTION PHACO AND INTRAOCULAR LENS PLACEMENT (IOC);  Surgeon: Fabio Pierce, MD;  Location: [facility];  Service: Ophthalmology;  Laterality: Right;  CDE 5.61   CATARACT EXTRACTION W/PHACO Left 07/21/2023   Procedure: CATARACT EXTRACTION PHACO AND INTRAOCULAR LENS PLACEMENT (IOC);  Surgeon: Fabio Pierce, MD;  Location: [facility];  Service: Ophthalmology;  Laterality: Left;  CDE: 5.96   CESAREAN SECTION     x2   COLONOSCOPY N/A 03/03/2020   Procedure: COLONOSCOPY;  Surgeon: Corbin Ade, MD;  Location: [facility];  Service: Endoscopy;  Laterality: N/A;  12:00   IR IMAGING GUIDED PORT INSERTION  11/07/2021   left mastectomy     NEPHRECTOMY Right 02/06/2021   Procedure: NEPHRECTOMY- open radical;  Surgeon: Malen Gauze, MD;  Location: [facility];  Service: Urology;  Laterality: Right;   POLYPECTOMY  03/03/2020   Procedure: POLYPECTOMY;  Surgeon: Corbin Ade, MD;  Location: [facility];  Service: Endoscopy;;    Social History: Social History   Socioeconomic History   Marital status: Married    Spouse name: Not on file   Number of children: 2   Years of education: Not on file   Highest education level: GED or equivalent  Occupational History   Occupation: CNA  Tobacco Use   Smoking status: Former    Current packs/day: 0.00     Average packs/day: 0.5 packs/day for 18.0 years (9.0 ttl pk-yrs)    Types: Cigarettes    Start date: 07/01/1984    Quit date: 07/01/2002    Years since quitting: 21.3   Smokeless tobacco: Never  Vaping Use   Vaping status: Never Used  Substance and Sexual Activity   Alcohol use: No   Drug use: No   Sexual activity: Yes    Birth control/protection: None  Other Topics Concern   Not on file  Social History Narrative   Not on  file   Social Drivers of Health   Financial Resource Strain: Medium Risk (08/14/2023)   Overall Financial Resource Strain (CARDIA)    Difficulty of Paying Living Expenses: Somewhat hard  Food Insecurity: No Food Insecurity (08/14/2023)   Hunger Vital Sign    Worried About Running Out of Food in the Last Year: Never true    Ran Out of Food in the Last Year: Never true  Transportation Needs: No Transportation Needs (08/14/2023)   PRAPARE - Administrator, Civil Service (Medical): No    Lack of Transportation (Non-Medical): No  Physical Activity: Insufficiently Active (08/14/2023)   Exercise Vital Sign    Days of Exercise per Week: 1 day    Minutes of Exercise per Session: 10 min  Stress: No Stress Concern Present (08/14/2023)   [employer] of Occupational Health - Occupational Stress Questionnaire    Feeling of Stress : Only a little  Social Connections: Moderately Integrated (08/14/2023)   Social Connection and Isolation Panel [NHANES]    Frequency of Communication with Friends and Family: Twice a week    Frequency of Social Gatherings with Friends and Family: More than three times a week    Attends Religious Services: More than 4 times per year    Active Member of [employer] or Organizations: Yes    Attends Engineer, structural: More than 4 times per year    Marital Status: Separated  Intimate Partner Violence: Not on file    Family History: Family History  Problem Relation Age of Onset   Alcohol abuse Mother    Hypertension  Mother    Diabetes Mother    Hyperlipidemia Mother    Colon cancer Maternal Aunt        dx 31s   Prostate cancer Maternal Uncle    Throat cancer Maternal Uncle    Kidney cancer Maternal Grandfather    Lung cancer Half-Sister    Kidney cancer Half-Sister 70   Breast cancer Half-Sister 56   Breast cancer Half-Sister 30    Current Medications:  Current Outpatient Medications:    acetaminophen (TYLENOL) 500 MG tablet, Take 500 mg by mouth every 6 (six) hours as needed for moderate pain or headache., Disp: , Rfl:    atorvastatin (LIPITOR) 40 MG tablet, Take 1 tablet (40 mg total) by mouth daily., Disp: 90 tablet, Rfl: 1   belzutifan (WELIREG) 40 MG tablet, Take 3 tablets (120 mg total) by mouth daily., Disp: 90 tablet, Rfl: 1   benzonatate (TESSALON) 100 MG capsule, Take 1 capsule (100 mg total) by mouth 2 (two) times daily as needed for cough., Disp: 20 capsule, Rfl: 0   cabozantinib (CABOMETYX) 40 MG tablet, Take 1 tablet (40 mg total) by mouth daily. Take on an empty stomach, 1 hour before or 2 hours after meals., Disp: 30 tablet, Rfl: 1   EPINEPHrine 0.3 mg/0.3 mL IJ SOAJ injection, Inject 0.3 mg into the muscle as needed for anaphylaxis., Disp: 2 each, Rfl: 0   furosemide (LASIX) 20 MG tablet, TAKE 1 TABLET(20 MG) BY MOUTH DAILY AS NEEDED, Disp: 90 tablet, Rfl: 1   HYDROcodone-acetaminophen (NORCO) 5-325 MG tablet, Take 1 tablet by mouth every 6 (six) hours as needed for moderate pain (pain score 4-6)., Disp: 30 tablet, Rfl: 0   levothyroxine (SYNTHROID) 125 MCG tablet, Take 1 tablet (125 mcg total) by mouth daily before breakfast., Disp: 90 tablet, Rfl: 1   Multiple Vitamin (MULTIVITAMIN WITH MINERALS) TABS tablet, Take 1 tablet by mouth daily.,  Disp: , Rfl:    olmesartan-hydrochlorothiazide (BENICAR HCT) 40-25 MG tablet, TAKE 1 TABLET BY MOUTH DAILY, Disp: 30 tablet, Rfl: 1   UNABLE TO FIND, MASTECTOMY BRA AND PROTHESIS DX Z90.12, Disp: 6 each, Rfl: 2   UNABLE TO FIND, 6 mastectomy  prosthesis and bras., Disp: 6 each, Rfl: 0   urea (CARMOL) 10 % cream, Apply topically 2 (two) times daily. Apply twice daily to hands, Disp: 71 g, Rfl: 0 No current facility-administered medications for this visit.  Facility-Administered Medications Ordered in Other Visits:    heparin lock flush 100 unit/mL, 500 Units, Intravenous, Once, Doreatha Massed, MD   Allergies: Allergies  Allergen Reactions   Amlodipine Swelling    Leg edema   Sulfonamide Derivatives Itching   Yellow Jacket Venom [Bee Venom] Rash    urticaria    REVIEW OF SYSTEMS:   Review of Systems  Constitutional:  Negative for chills, fatigue and fever.  HENT:   Negative for lump/mass, mouth sores, nosebleeds, sore throat and trouble swallowing.   Eyes:  Negative for eye problems.  Respiratory:  Negative for cough and shortness of breath.   Cardiovascular:  Negative for chest pain, leg swelling and palpitations.  Gastrointestinal:  Positive for diarrhea. Negative for abdominal pain, constipation, nausea and vomiting.  Genitourinary:  Negative for bladder incontinence, difficulty urinating, dysuria, frequency, hematuria and nocturia.   Musculoskeletal:  Negative for arthralgias, back pain, flank pain, myalgias and neck pain.  Skin:  Negative for itching and rash.  Neurological:  Negative for dizziness, headaches and numbness.  Hematological:  Does not bruise/bleed easily.  Psychiatric/Behavioral:  Positive for sleep disturbance. Negative for depression and suicidal ideas. The patient is not nervous/anxious.   All other systems reviewed and are negative.    VITALS:   Blood pressure (!) 155/81, pulse 86, temperature (!) 97.4 F (36.3 C), temperature source Tympanic, resp. rate 18, height 5\' 6"  (1.676 m), weight 179 lb (81.2 kg), SpO2 100%.  Wt Readings from Last 3 Encounters:  10/30/23 179 lb (81.2 kg)  09/09/23 162 lb 7.7 oz (73.7 kg)  08/14/23 151 lb 1.3 oz (68.5 kg)    Body mass index is 28.89  kg/m.  Performance status (ECOG): 0 - Asymptomatic  PHYSICAL EXAM:   Physical Exam Vitals and nursing note reviewed. Exam conducted with a chaperone present.  Constitutional:      Appearance: Normal appearance.  Cardiovascular:     Rate and Rhythm: Normal rate and regular rhythm.     Pulses: Normal pulses.     Heart sounds: Normal heart sounds.  Pulmonary:     Effort: Pulmonary effort is normal.     Breath sounds: Normal breath sounds.  Abdominal:     Palpations: Abdomen is soft. There is no hepatomegaly, splenomegaly or mass.     Tenderness: There is no abdominal tenderness.  Musculoskeletal:     Right lower leg: 2+ Edema (up to the thigh) present.     Left lower leg: 2+ Edema (up to the thigh) present.  Lymphadenopathy:     Cervical: No cervical adenopathy.     Right cervical: No superficial, deep or posterior cervical adenopathy.    Left cervical: No superficial, deep or posterior cervical adenopathy.     Upper Body:     Right upper body: No supraclavicular or axillary adenopathy.     Left upper body: No supraclavicular or axillary adenopathy.  Neurological:     General: No focal deficit present.     Mental Status: She is  alert and oriented to person, place, and time.  Psychiatric:        Mood and Affect: Mood normal.        Behavior: Behavior normal.     LABS:      Latest Ref Rng & Units 10/30/2023   10:48 AM 10/07/2023    3:20 PM 09/29/2023    8:12 AM  CBC  WBC 4.0 - 10.5 K/uL 7.6  6.8  6.8   Hemoglobin 12.0 - 15.0 g/dL 95.2  7.6  6.9   Hematocrit 36.0 - 46.0 % 36.3  23.4  21.3   Platelets 150 - 400 K/uL 309  297  368       Latest Ref Rng & Units 10/30/2023   10:48 AM 10/07/2023    3:20 PM 09/29/2023    8:12 AM  CMP  Glucose 70 - 99 mg/dL 73  99  841   BUN 8 - 23 mg/dL 17  32  28   Creatinine 0.44 - 1.00 mg/dL 3.24  4.01  0.27   Sodium 135 - 145 mmol/L 132  127  129   Potassium 3.5 - 5.1 mmol/L 4.4  5.1  5.3   Chloride 98 - 111 mmol/L 97  94  92    CO2 22 - 32 mmol/L 21  24  26    Calcium 8.9 - 10.3 mg/dL 8.9  9.0  9.5   Total Protein 6.5 - 8.1 g/dL 6.4  6.8  7.2   Total Bilirubin 0.0 - 1.2 mg/dL 0.7  0.5  0.4   Alkaline Phos 38 - 126 U/L 501  291  275   AST 15 - 41 U/L 79  65  52   ALT 0 - 44 U/L 26  23  26       No results found for: "CEA1", "CEA" / No results found for: "CEA1", "CEA" No results found for: "PSA1" No results found for: "OZD664" No results found for: "CAN125"  No results found for: "TOTALPROTELP", "ALBUMINELP", "A1GS", "A2GS", "BETS", "BETA2SER", "GAMS", "MSPIKE", "SPEI" Lab Results  Component Value Date   TIBC 284 08/26/2023   TIBC 308 07/15/2023   TIBC 307 04/29/2023   FERRITIN 272 08/26/2023   FERRITIN 180 07/15/2023   FERRITIN 152 04/29/2023   IRONPCTSAT 17 08/26/2023   IRONPCTSAT 21 07/15/2023   IRONPCTSAT 20 04/29/2023   Lab Results  Component Value Date   LDH 178 07/15/2023   LDH 178 02/21/2022   LDH 152 09/27/2021     STUDIES:   No results found.

## 2023-10-30 NOTE — Progress Notes (Signed)
Port flushed with good blood return noted. No bruising or swelling at site. Bandaid applied and patient discharged in satisfactory condition. VVS stable with no signs or symptoms of distressed noted. 

## 2023-10-30 NOTE — Progress Notes (Signed)
Patient is taking Cabometyx as prescribed.  She has not missed any doses and reports no side effects at this time.

## 2023-10-30 NOTE — Patient Instructions (Addendum)
[facility] at [facility] Discharge Instructions   You were seen and examined today by Dr. Ellin Saba.  He reviewed the results of your lab work which are mostly normal/stable. A couple of your liver numbers are elevated. Your hemoglobin is normal and your kidney function has improved.   Continue Cabometyx as prescribed.   You may start back on your blood pressure medication.   We will see you back in 2 weeks. We will repeat lab work prior to this visit.   Return as scheduled.    Thank you for choosing [facility] at [facility] to provide your oncology and hematology care.  To afford each patient quality time with our provider, please arrive at least 15 minutes before your scheduled appointment time.   If you have a lab appointment with the Cancer Center please come in thru the Main Entrance and check in at the main information desk.  You need to re-schedule your appointment should you arrive 10 or more minutes late.  We strive to give you quality time with our providers, and arriving late affects you and other patients whose appointments are after yours.  Also, if you no show three or more times for appointments you may be dismissed from the clinic at the providers discretion.     Again, thank you for choosing [facility].  Our hope is that these requests will decrease the amount of time that you wait before being seen by our physicians.       _____________________________________________________________  Should you have questions after your visit to [facility], please contact our office at [phone] and follow the prompts.  Our office hours are 8:00 a.m. and 4:30 p.m. Monday - Friday.  Please note that voicemails left after 4:00 p.m. may not be returned until the following business day.  We are closed weekends and major holidays.  You do have access to a nurse 24-7, just call the main number to the clinic  [phone] and do not press any options, hold on the line and a nurse will answer the phone.    For prescription refill requests, have your pharmacy contact our office and allow 72 hours.    Due to Covid, you will need to wear a mask upon entering the hospital. If you do not have a mask, a mask will be given to you at the Main Entrance upon arrival. For doctor visits, patients may have 1 support person age 27 or older with them. For treatment visits, patients can not have anyone with them due to social distancing guidelines and our immunocompromised population.

## 2023-11-04 ENCOUNTER — Ambulatory Visit (INDEPENDENT_AMBULATORY_CARE_PROVIDER_SITE_OTHER): Payer: 59 | Admitting: Psychiatry

## 2023-11-04 DIAGNOSIS — F4323 Adjustment disorder with mixed anxiety and depressed mood: Secondary | ICD-10-CM | POA: Diagnosis not present

## 2023-11-04 NOTE — Progress Notes (Signed)
Virtual Visit via Telephone Note  I connected with Deanna Bush on 11/04/23 at 2:07 PM EST by telephone and verified that I am speaking with the correct person using two identifiers.  Location: Patient: Home Provider: [facility] [place] office    I discussed the limitations, risks, security and privacy concerns of performing an evaluation and management service by telephone and the availability of in person appointments. I also discussed with the patient that there may be a patient responsible charge related to this service. The patient expressed understanding and agreed to proceed.    I provided 33 minutes of non-face-to-face time during this encounter.   Deanna Salvage, Deanna Bush   THERAPIST PROGRESS NOTE  Session Time:  Tuesday 11/04/2023  2:07 PM -  2:40 PM   Participation Level: Active  Behavioral Response: CasualAlert/  Type of Therapy: Individual Therapy  Treatment Goals addressed: Have healthy grieving process around loss Begin verbalizing feelings associated with loss     ProgressTowards Goals: Progressing  Interventions: Supportive/CBT  Summary: Deanna Bush is a 63 y.o. female who p is referred for services by PCP Deanna Bush due to Deanna Bush experiencing symptoms of anxiety along with grief and loss issues. Deanna Bush denies any psychiatric hospitalizations. Deanna Bush has no previous involvement in outpatient therapy.  Per patient's report her stepdaughter was diagnosed with colon cancer at age 53 and died in Oct 28, 2022.  Patient was her caretaker and says patient died at her home.  Patient reports her husband left her about 2 weeks ago for 30 year old after 30 years of marriage.  Patient reports additional stress regarding providing care for her 27 year old mother.  Patient also has her own health issues as Deanna Bush was diagnosed with kidney cancer 2 years ago and had chemo.  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 Deanna Bush currently is taking experimental drugs.   Patient reports crying spells, sadness, irritability, worry, anxiety, and sleeping difficulty.   Patient last was seen about 4 weeks ago. Deanna Bush reports feeling much better since last session. Per Deanna Bush's report, Deanna Bush had become very depressed due to her illness, fatigue, and loss of independence. However, her doctor changed her medication about a  1 1/2 weeks ago. Deanna Bush is experiencing increased energy and no longer requires use of oxygen tank. Deanna Bush has resumed some ADLs like taking a wash off. Deanna Bush also has resumed self-nurturing activities like moisturizing her face. Deanna Bush also is pleased her hairdresser came to her home and did her hair. Deanna Bush reports increased social involvement with immediate family and grandchildren. Deanna Bush also reports continued strong support from sons, sister, pastor, and friends. Deanna Bush states taking one day at a time and enjoying time with her family. Deanna Bush reports acceptance of dissolution of marriage. Deanna Bush states just waiting to sign final papers.   Suicidal/Homicidal: Nowithout intent/plan  Therapist Response: Reviewed symptoms, discussed stressors, facilitated Deanna Bush expressing thoughts and feelings, validated feelings, praised and reinforced Deanna Bush's use of  support system and her spirituality, praised and reinforced Deanna Bush's involvement with family, discussed acceptance of current feelings/situation while also focusing on value driven action, assisted patient identify how Deanna Bush wants to interact with her family and activities Deanna Bush wants to pursue with family  Plan: Return again in 2 weeks.  Diagnosis: Adjustment disorder with mixed anxiety and depressed mood      Collaboration of Care: Primary Care Provider AEB patient is seeing PCP Dr. [name] for medication management  Patient/Guardian was advised Release of Information must  be obtained prior to any record release in order to collaborate their care with an outside provider. Patient/Guardian was advised if they have not already done so to  contact the registration department to sign all necessary forms in order for [place] to release information regarding their care.   Consent: Patient/Guardian gives verbal consent for treatment and assignment of benefits for services provided during this visit. Patient/Guardian expressed understanding and agreed to proceed.   Huey Scalia Debe Coder, LCSW1/28/2025

## 2023-11-16 NOTE — Progress Notes (Signed)
 [facility] [address] [place] [address]    Clinic Day:  11/17/2023  Referring physician: Towanda Fret, MD  Patient Care Team: Deanna Fret, MD as PCP - General Mallipeddi, Kennyth Pean, MD as PCP - Cardiology (Cardiology) Deanna Boros, MD as Medical Oncologist (Medical Oncology)   ASSESSMENT & PLAN:   Assessment: 1. Metastatic clear-cell renal cell carcinoma: - Right radical nephrectomy on 02/06/2021. - Pathology shows clear-cell RCC, grade 4, 12.5 cm, necrosis and rhabdoid features are identified.  Tumor extends into the and invades the wall of the vena cava (pT3c).  Vascular, ureteral and all margins of resection are negative for tumor. - CT scan abdomen with and without contrast on 05/29/2021 showed several nodules along the peritoneal surface of the right nephrectomy bed warranting close attention. - CTAP with and without contrast on 09/18/2021 showed progressive nodularity within the right nephrectomy bed, with enlarged nodules adjacent to the right hepatic lobe extending inferiorly along the right retroperitoneum with multiple enlarged enhancing nodules.  Direct metastatic invasion into the right hepatic lobe.  Small nodule at the right lung base favored to be benign. - IMDC: Intermediate risk group with 2 features including diagnosis less than 1 year and hemoglobin lower than normal. - Bone scan on 10/05/2021 with focal activity in the mental vertex of the mandible which could be inflammatory/traumatic/metastatic. - CT chest on 10/03/2021 showed several lung nodules scattered bilaterally some of which could be metastatic and many others could be due to mucoid impaction. - NGS testing did not show any targetable mutations.  TMB was low.  CCND3 amplified.  PBRM1 VUS present. - Right nephrectomy bed biopsy (10/22/2021): Clear-cell renal cell carcinoma with necrosis - 4 cycles of ipilimumab  and nivolumab  from 11/19/2021 through 01/24/2022 - CT CAP  on 02/14/2022: Numerous new and enlarged lung nodules.  Significant interval increase in the mass in the right renal bed.  New hypodense liver lesions. - Lenvatinib  (12 mg) and everolimus  5 mg daily started on 03/13/2022, discontinued on 07/22/2023 due to progression. - Belzutifan  120 mg daily started on 07/29/2023, discontinued due to progression. - Cabozantinib 40 mg daily started on 10/20/2023.   2. Social/family history: - She lives at home with her husband and also takes care of her mother. - She works as a Child psychotherapist at [employer].  She quit smoking in 2004 and smoked half pack per day for 10 years. - Half sister (same mother) had kidney cancer at age 57.  Maternal grandfather had kidney cancer.  Sister died of lung cancer.  2 half sisters (same father) had breast cancers.  3.  Left breast stage I tubular adenocarcinoma: - She underwent left mastectomy on 06/11/2002.  0/6 lymph nodes positive.  ER 90%, PR 92%, Ki-67 6%, HER2 negative.  As per chart, declined antiestrogen therapy.    Plan: 1. Metastatic clear-cell RCC, intermediate risk by IMDC: - She is tolerating cabozantinib 40 mg daily very well.  She denies any diarrhea or skin rashes.  Reports good appetite. - Labs today with improvement in alk phos, AST.  Total bilirubin normal.  Albumin improved to 2.9.  CBC grossly normal. - Functionally she looks better and did not require wheelchair today.  Continue cabozantinib 40 mg daily.  RTC 2 weeks for follow-up.  2.  Normocytic anemia: - Hemoglobin today is 10.5, down from 12.1, from myelosuppression from cabozantinib.  Will closely monitor.  3.  Hypothyroidism: - She is taking Synthroid  125 mcg daily.  TSH today is 93.  Will repeat in [redacted] weeks along with free T4 and T3 levels.  She has an appointment with Dr. Monte Bush on 12/02/2023.   4.  Hypertension: - Continue olmesartan /HCTZ.  Blood pressure today is 134/74.   5.  Right loin pain: - She does not report any pain in the  right groin area.  Take hydrocodone  as needed.  6.  Fluid retention: - She was started on Lasix  20 mg daily at last visit.  She lost about 24 pounds, mostly fluid weight.  Use Lasix  as needed.    Orders Placed This Encounter  Procedures   TSH    Standing Status:   Future    Expected Date:   12/01/2023    Expiration Date:   11/16/2024   T4, free    Standing Status:   Future    Expected Date:   12/01/2023    Expiration Date:   11/16/2024   T3    Standing Status:   Future    Expected Date:   12/01/2023    Expiration Date:   11/16/2024   CBC with Differential    Standing Status:   Future    Expected Date:   12/01/2023    Expiration Date:   11/16/2024   Comprehensive metabolic panel    Standing Status:   Future    Expected Date:   12/01/2023    Expiration Date:   11/16/2024   Magnesium     Standing Status:   Future    Expected Date:   12/01/2023    Expiration Date:   11/16/2024      I,Deanna Bush,acting as a scribe for Deanna Boros, MD.,have documented all relevant documentation on the behalf of Deanna Boros, MD,as directed by  Deanna Boros, MD while in the presence of Deanna Boros, MD.   I, Deanna Boros MD, have reviewed the above documentation for accuracy and completeness, and I agree with the above.   Deanna Boros, MD   2/10/20252:54 PM  CHIEF COMPLAINT:   Diagnosis: metastatic clear-cell renal cell carcinoma    Cancer Staging  Malignant neoplasm of female breast [facility]) Staging form: Breast, AJCC 6th Edition - Clinical: Stage I (T1b, N0, M0) - Signed by Deanna Gant, PA on 08/16/2011  Renal cell cancer [facility]) Staging form: Kidney, AJCC 8th Edition - Clinical stage from 09/26/2021: Stage IV (cT3c, cNX, cM1) - Unsigned    Prior Therapy: 1. Right nephrectomy, 02/06/21 2. Nivolumab  plus ipilimumab , 4 cycles, 11/19/21 - 01/24/22 3. lenvatinib  and everolimus , 03/13/22 - 07/22/23 4. Belzutifan , 07/29/23 - 10/2023  Current Therapy:   Cabozantinib    HISTORY OF PRESENT ILLNESS:   Oncology History  Renal cell cancer (HCC)  06/20/2021 Initial Diagnosis   Renal cell carcinoma of right kidney (HCC)   11/19/2021 - 01/24/2022 Chemotherapy   Patient is on Treatment Plan : RENAL CELL CARCINOMA Nivolumab  + Ipilimumab  q21d / Nivolumab  q28d      Genetic Testing   Negative genetic testing. No pathogenic variants identified on the [facility] Multi-Cancer Panel + RNA. VUS in PMS2 called c.1732C>A and in RECQL4 called c.2967G>A  Identified. The report date is 11/01/2021.   The Multi-Cancer Panel + RNA offered by [facility] includes sequencing and/or deletion duplication testing of the following 84 genes: AIP, ALK, APC, ATM, AXIN2,BAP1,  BARD1, BLM, BMPR1A, BRCA1, BRCA2, BRIP1, CASR, CDC73, CDH1, CDK4, CDKN1B, CDKN1C, CDKN2A (p14ARF), CDKN2A (p16INK4a), CEBPA, CHEK2, CTNNA1, DICER1, DIS3L2, EGFR (c.2369C>T, p.Thr790Met variant only), EPCAM (Deletion/duplication testing only), FH, FLCN, GATA2, GPC3,  GREM1 (Promoter region deletion/duplication testing only), HOXB13 (c.251G>A, p.Gly84Glu), HRAS, KIT, MAX, MEN1, MET, MITF (c.952G>A, p.Glu318Lys variant only), MLH1, MSH2, MSH3, MSH6, MUTYH, NBN, NF1, NF2, NTHL1, PALB2, PDGFRA, PHOX2B, PMS2, POLD1, POLE, POT1, PRKAR1A, PTCH1, PTEN, RAD50, RAD51C, RAD51D, RB1, RECQL4, RET, RUNX1, SDHAF2, SDHA (sequence changes only), SDHB, SDHC, SDHD, SMAD4, SMARCA4, SMARCB1, SMARCE1, STK11, SUFU, TERC, TERT, TMEM127, TP53, TSC1, TSC2, VHL, WRN and WT1.       INTERVAL HISTORY:   Deanna Bush is a 63 y.o. female presenting to clinic today for follow up of metastatic clear-cell renal cell carcinoma. She was last seen by me on 10/30/23.  Today, she states that she is doing well overall. Her appetite level is at 100%. Her energy level is at 70%.  PAST MEDICAL HISTORY:   Past Medical History: Past Medical History:  Diagnosis Date   Adenocarcinoma of breast (HCC)    left    Cellulitis of leg, left    Complication of  anesthesia    Hard to wake up   Depression    Diabetes mellitus without complication (HCC)    Pt denies   Family history of breast cancer 08/16/2011   Family history of breast cancer    Family history of colon cancer    Family history of kidney cancer    Family history of prostate cancer    Hyperlipidemia    Hypertension    Hypothyroidism    MRSA (methicillin resistant staph aureus) culture positive 08/19/2011   Pre-diabetes     Surgical History: Past Surgical History:  Procedure Laterality Date   BREAST SURGERY Left    mastectomy   CATARACT EXTRACTION W/PHACO Right 06/06/2023   Procedure: CATARACT EXTRACTION PHACO AND INTRAOCULAR LENS PLACEMENT (IOC);  Surgeon: Tarri Farm, MD;  Location: [facility];  Service: Ophthalmology;  Laterality: Right;  CDE 5.61   CATARACT EXTRACTION W/PHACO Left 07/21/2023   Procedure: CATARACT EXTRACTION PHACO AND INTRAOCULAR LENS PLACEMENT (IOC);  Surgeon: Tarri Farm, MD;  Location: [facility];  Service: Ophthalmology;  Laterality: Left;  CDE: 5.96   CESAREAN SECTION     x2   COLONOSCOPY N/A 03/03/2020   Procedure: COLONOSCOPY;  Surgeon: Suzette Espy, MD;  Location: [facility];  Service: Endoscopy;  Laterality: N/A;  12:00   IR IMAGING GUIDED PORT INSERTION  11/07/2021   left mastectomy     NEPHRECTOMY Right 02/06/2021   Procedure: NEPHRECTOMY- open radical;  Surgeon: Marco Severs, MD;  Location: [facility];  Service: Urology;  Laterality: Right;   POLYPECTOMY  03/03/2020   Procedure: POLYPECTOMY;  Surgeon: Suzette Espy, MD;  Location: [facility];  Service: Endoscopy;;    Social History: Social History   Socioeconomic History   Marital status: Married    Spouse name: Not on file   Number of children: 2   Years of education: Not on file   Highest education level: GED or equivalent  Occupational History   Occupation: CNA  Tobacco Use   Smoking status: Former    Current packs/day: 0.00    Average packs/day: 0.5 packs/day for 18.0  years (9.0 ttl pk-yrs)    Types: Cigarettes    Start date: 07/01/1984    Quit date: 07/01/2002    Years since quitting: 21.3   Smokeless tobacco: Never  Vaping Use   Vaping status: Never Used  Substance and Sexual Activity   Alcohol use: No   Drug use: No   Sexual activity: Yes    Birth control/protection: None  Other Topics Concern  Not on file  Social History Narrative   Not on file   Social Drivers of Health   Financial Resource Strain: Medium Risk (08/14/2023)   Overall Financial Resource Strain (CARDIA)    Difficulty of Paying Living Expenses: Somewhat hard  Food Insecurity: No Food Insecurity (08/14/2023)   Hunger Vital Sign    Worried About Running Out of Food in the Last Year: Never true    Ran Out of Food in the Last Year: Never true  Transportation Needs: No Transportation Needs (08/14/2023)   PRAPARE - Administrator, Civil Service (Medical): No    Lack of Transportation (Non-Medical): No  Physical Activity: Insufficiently Active (08/14/2023)   Exercise Vital Sign    Days of Exercise per Week: 1 day    Minutes of Exercise per Session: 10 min  Stress: No Stress Concern Present (08/14/2023)   [employer] of Occupational Health - Occupational Stress Questionnaire    Feeling of Stress : Only a little  Social Connections: Moderately Integrated (08/14/2023)   Social Connection and Isolation Panel [NHANES]    Frequency of Communication with Friends and Family: Twice a week    Frequency of Social Gatherings with Friends and Family: More than three times a week    Attends Religious Services: More than 4 times per year    Active Member of [employer] or Organizations: Yes    Attends Engineer, structural: More than 4 times per year    Marital Status: Separated  Intimate Partner Violence: Not on file    Family History: Family History  Problem Relation Age of Onset   Alcohol abuse Mother    Hypertension Mother    Diabetes Mother    Hyperlipidemia  Mother    Colon cancer Maternal Aunt        dx 56s   Prostate cancer Maternal Uncle    Throat cancer Maternal Uncle    Kidney cancer Maternal Grandfather    Lung cancer Half-Sister    Kidney cancer Half-Sister 53   Breast cancer Half-Sister 60   Breast cancer Half-Sister 35    Current Medications:  Current Outpatient Medications:    acetaminophen  (TYLENOL ) 500 MG tablet, Take 500 mg by mouth every 6 (six) hours as needed for moderate pain or headache., Disp: , Rfl:    atorvastatin  (LIPITOR) 40 MG tablet, Take 1 tablet (40 mg total) by mouth daily., Disp: 90 tablet, Rfl: 1   belzutifan  (WELIREG ) 40 MG tablet, Take 3 tablets (120 mg total) by mouth daily., Disp: 90 tablet, Rfl: 1   benzonatate  (TESSALON ) 100 MG capsule, Take 1 capsule (100 mg total) by mouth 2 (two) times daily as needed for cough., Disp: 20 capsule, Rfl: 0   cabozantinib (CABOMETYX ) 40 MG tablet, Take 1 tablet (40 mg total) by mouth daily. Take on an empty stomach, 1 hour before or 2 hours after meals., Disp: 30 tablet, Rfl: 1   EPINEPHrine  0.3 mg/0.3 mL IJ SOAJ injection, Inject 0.3 mg into the muscle as needed for anaphylaxis., Disp: 2 each, Rfl: 0   furosemide  (LASIX ) 20 MG tablet, TAKE 1 TABLET(20 MG) BY MOUTH DAILY AS NEEDED, Disp: 90 tablet, Rfl: 1   HYDROcodone -acetaminophen  (NORCO) 5-325 MG tablet, Take 1 tablet by mouth every 6 (six) hours as needed for moderate pain (pain score 4-6)., Disp: 30 tablet, Rfl: 0   levothyroxine  (SYNTHROID ) 125 MCG tablet, Take 1 tablet (125 mcg total) by mouth daily before breakfast., Disp: 90 tablet, Rfl: 1   Multiple Vitamin (  MULTIVITAMIN WITH MINERALS) TABS tablet, Take 1 tablet by mouth daily., Disp: , Rfl:    olmesartan -hydrochlorothiazide (BENICAR  HCT) 40-25 MG tablet, TAKE 1 TABLET BY MOUTH DAILY, Disp: 30 tablet, Rfl: 1   UNABLE TO FIND, MASTECTOMY BRA AND PROTHESIS DX Z90.12, Disp: 6 each, Rfl: 2   UNABLE TO FIND, 6 mastectomy prosthesis and bras., Disp: 6 each, Rfl: 0    urea  (CARMOL) 10 % cream, Apply topically 2 (two) times daily. Apply twice daily to hands, Disp: 71 g, Rfl: 0 No current facility-administered medications for this visit.  Facility-Administered Medications Ordered in Other Visits:    heparin  lock flush 100 unit/mL, 500 Units, Intravenous, Once, Bradleigh Sonnen, MD   Allergies: Allergies  Allergen Reactions   Amlodipine  Swelling    Leg edema   Sulfonamide Derivatives Itching   Yellow Jacket Venom [Bee Venom] Rash    urticaria    REVIEW OF SYSTEMS:   Review of Systems  Constitutional:  Negative for chills, fatigue and fever.  HENT:   Negative for lump/mass, mouth sores, nosebleeds, sore throat and trouble swallowing.   Eyes:  Negative for eye problems.  Respiratory:  Negative for cough and shortness of breath.   Cardiovascular:  Negative for chest pain, leg swelling and palpitations.  Gastrointestinal:  Negative for abdominal pain, constipation, diarrhea, nausea and vomiting.  Genitourinary:  Negative for bladder incontinence, difficulty urinating, dysuria, frequency, hematuria and nocturia.   Musculoskeletal:  Negative for arthralgias, back pain, flank pain, myalgias and neck pain.  Skin:  Negative for itching and rash.  Neurological:  Positive for headaches. Negative for dizziness and numbness.  Hematological:  Does not bruise/bleed easily.  Psychiatric/Behavioral:  Negative for depression, sleep disturbance and suicidal ideas. The patient is not nervous/anxious.   All other systems reviewed and are negative.    VITALS:   Blood pressure 134/74, pulse 87, temperature 98.1 F (36.7 C), temperature source Oral, resp. rate 18, height 5\' 6"  (1.676 m), weight 155 lb 14.4 oz (70.7 kg), SpO2 95%.  Wt Readings from Last 3 Encounters:  11/17/23 155 lb 14.4 oz (70.7 kg)  10/30/23 179 lb (81.2 kg)  09/09/23 162 lb 7.7 oz (73.7 kg)    Body mass index is 25.16 kg/m.  Performance status (ECOG): 1 - Symptomatic but completely  ambulatory  PHYSICAL EXAM:   Physical Exam Vitals and nursing note reviewed. Exam conducted with a chaperone present.  Constitutional:      Appearance: Normal appearance.  Cardiovascular:     Rate and Rhythm: Normal rate and regular rhythm.     Pulses: Normal pulses.     Heart sounds: Normal heart sounds.  Pulmonary:     Effort: Pulmonary effort is normal.     Breath sounds: Normal breath sounds.  Abdominal:     Palpations: Abdomen is soft. There is no hepatomegaly, splenomegaly or mass.     Tenderness: There is no abdominal tenderness.  Musculoskeletal:     Right lower leg: No edema.     Left lower leg: No edema.  Lymphadenopathy:     Cervical: No cervical adenopathy.     Right cervical: No superficial, deep or posterior cervical adenopathy.    Left cervical: No superficial, deep or posterior cervical adenopathy.     Upper Body:     Right upper body: No supraclavicular or axillary adenopathy.     Left upper body: No supraclavicular or axillary adenopathy.  Neurological:     General: No focal deficit present.     Mental Status:  She is alert and oriented to person, place, and time.  Psychiatric:        Mood and Affect: Mood normal.        Behavior: Behavior normal.     LABS:   CBC     Component Value Date/Time   WBC 5.5 11/17/2023 0945   RBC 3.42 (L) 11/17/2023 0945   HGB 10.5 (L) 11/17/2023 0945   HGB 11.1 07/10/2021 1530   HCT 31.6 (L) 11/17/2023 0945   HCT 33.7 (L) 07/10/2021 1530   PLT 318 11/17/2023 0945   PLT 374 07/10/2021 1530   MCV 92.4 11/17/2023 0945   MCV 89 07/10/2021 1530   MCH 30.7 11/17/2023 0945   MCHC 33.2 11/17/2023 0945   RDW 18.9 (H) 11/17/2023 0945   RDW 13.0 07/10/2021 1530   LYMPHSABS 2.0 11/17/2023 0945   LYMPHSABS 2.5 07/10/2021 1530   MONOABS 0.4 11/17/2023 0945   EOSABS 0.1 11/17/2023 0945   EOSABS 0.0 07/10/2021 1530   BASOSABS 0.0 11/17/2023 0945   BASOSABS 0.0 07/10/2021 1530    CMP      Component Value Date/Time   NA  131 (L) 11/17/2023 0945   NA 139 07/21/2023 0905   K 4.0 11/17/2023 0945   CL 92 (L) 11/17/2023 0945   CO2 23 11/17/2023 0945   GLUCOSE 97 11/17/2023 0945   BUN 17 11/17/2023 0945   BUN 18 07/21/2023 0905   CREATININE 0.95 11/17/2023 0945   CREATININE 0.73 04/22/2019 0848   CALCIUM  9.4 11/17/2023 0945   PROT 6.9 11/17/2023 0945   PROT 6.5 07/21/2023 0905   ALBUMIN 2.9 (L) 11/17/2023 0945   ALBUMIN 3.8 (L) 07/21/2023 0905   AST 54 (H) 11/17/2023 0945   ALT 24 11/17/2023 0945   ALKPHOS 253 (H) 11/17/2023 0945   BILITOT 0.8 11/17/2023 0945   BILITOT 0.4 07/21/2023 0905   GFRNONAA >60 11/17/2023 0945   GFRNONAA 90 04/22/2019 0848   GFRAA 96 10/30/2020 0914   GFRAA 104 04/22/2019 0848     No results found for: "CEA1", "CEA" / No results found for: "CEA1", "CEA" No results found for: "PSA1" No results found for: "CAN199" No results found for: "CAN125"  No results found for: "TOTALPROTELP", "ALBUMINELP", "A1GS", "A2GS", "BETS", "BETA2SER", "GAMS", "MSPIKE", "SPEI" Lab Results  Component Value Date   TIBC 284 08/26/2023   TIBC 308 07/15/2023   TIBC 307 04/29/2023   FERRITIN 272 08/26/2023   FERRITIN 180 07/15/2023   FERRITIN 152 04/29/2023   IRONPCTSAT 17 08/26/2023   IRONPCTSAT 21 07/15/2023   IRONPCTSAT 20 04/29/2023   Lab Results  Component Value Date   LDH 178 07/15/2023   LDH 178 02/21/2022   LDH 152 09/27/2021     STUDIES:   No results found.

## 2023-11-17 ENCOUNTER — Inpatient Hospital Stay: Payer: 59

## 2023-11-17 ENCOUNTER — Inpatient Hospital Stay: Payer: 59 | Attending: Hematology | Admitting: Hematology

## 2023-11-17 VITALS — BP 134/74 | HR 87 | Temp 98.1°F | Resp 18 | Ht 66.0 in | Wt 155.9 lb

## 2023-11-17 DIAGNOSIS — K769 Liver disease, unspecified: Secondary | ICD-10-CM | POA: Insufficient documentation

## 2023-11-17 DIAGNOSIS — Z803 Family history of malignant neoplasm of breast: Secondary | ICD-10-CM | POA: Diagnosis not present

## 2023-11-17 DIAGNOSIS — Z905 Acquired absence of kidney: Secondary | ICD-10-CM | POA: Diagnosis not present

## 2023-11-17 DIAGNOSIS — R918 Other nonspecific abnormal finding of lung field: Secondary | ICD-10-CM | POA: Insufficient documentation

## 2023-11-17 DIAGNOSIS — Z87891 Personal history of nicotine dependence: Secondary | ICD-10-CM | POA: Insufficient documentation

## 2023-11-17 DIAGNOSIS — Z8051 Family history of malignant neoplasm of kidney: Secondary | ICD-10-CM | POA: Insufficient documentation

## 2023-11-17 DIAGNOSIS — Z8042 Family history of malignant neoplasm of prostate: Secondary | ICD-10-CM | POA: Diagnosis not present

## 2023-11-17 DIAGNOSIS — Z853 Personal history of malignant neoplasm of breast: Secondary | ICD-10-CM | POA: Insufficient documentation

## 2023-11-17 DIAGNOSIS — I10 Essential (primary) hypertension: Secondary | ICD-10-CM | POA: Diagnosis not present

## 2023-11-17 DIAGNOSIS — Z9221 Personal history of antineoplastic chemotherapy: Secondary | ICD-10-CM | POA: Insufficient documentation

## 2023-11-17 DIAGNOSIS — Z801 Family history of malignant neoplasm of trachea, bronchus and lung: Secondary | ICD-10-CM | POA: Insufficient documentation

## 2023-11-17 DIAGNOSIS — Z800 Family history of malignant neoplasm of digestive organs: Secondary | ICD-10-CM | POA: Insufficient documentation

## 2023-11-17 DIAGNOSIS — C641 Malignant neoplasm of right kidney, except renal pelvis: Secondary | ICD-10-CM | POA: Insufficient documentation

## 2023-11-17 DIAGNOSIS — Z79899 Other long term (current) drug therapy: Secondary | ICD-10-CM | POA: Diagnosis not present

## 2023-11-17 DIAGNOSIS — Z9012 Acquired absence of left breast and nipple: Secondary | ICD-10-CM | POA: Diagnosis not present

## 2023-11-17 DIAGNOSIS — M545 Low back pain, unspecified: Secondary | ICD-10-CM | POA: Insufficient documentation

## 2023-11-17 DIAGNOSIS — R609 Edema, unspecified: Secondary | ICD-10-CM | POA: Diagnosis not present

## 2023-11-17 DIAGNOSIS — D649 Anemia, unspecified: Secondary | ICD-10-CM | POA: Insufficient documentation

## 2023-11-17 DIAGNOSIS — E039 Hypothyroidism, unspecified: Secondary | ICD-10-CM | POA: Insufficient documentation

## 2023-11-17 LAB — CBC WITH DIFFERENTIAL/PLATELET
Abs Immature Granulocytes: 0.01 10*3/uL (ref 0.00–0.07)
Basophils Absolute: 0 10*3/uL (ref 0.0–0.1)
Basophils Relative: 0 %
Eosinophils Absolute: 0.1 10*3/uL (ref 0.0–0.5)
Eosinophils Relative: 1 %
HCT: 31.6 % — ABNORMAL LOW (ref 36.0–46.0)
Hemoglobin: 10.5 g/dL — ABNORMAL LOW (ref 12.0–15.0)
Immature Granulocytes: 0 %
Lymphocytes Relative: 36 %
Lymphs Abs: 2 10*3/uL (ref 0.7–4.0)
MCH: 30.7 pg (ref 26.0–34.0)
MCHC: 33.2 g/dL (ref 30.0–36.0)
MCV: 92.4 fL (ref 80.0–100.0)
Monocytes Absolute: 0.4 10*3/uL (ref 0.1–1.0)
Monocytes Relative: 6 %
Neutro Abs: 3.1 10*3/uL (ref 1.7–7.7)
Neutrophils Relative %: 57 %
Platelets: 318 10*3/uL (ref 150–400)
RBC: 3.42 MIL/uL — ABNORMAL LOW (ref 3.87–5.11)
RDW: 18.9 % — ABNORMAL HIGH (ref 11.5–15.5)
WBC: 5.5 10*3/uL (ref 4.0–10.5)
nRBC: 0 % (ref 0.0–0.2)

## 2023-11-17 LAB — COMPREHENSIVE METABOLIC PANEL
ALT: 24 U/L (ref 0–44)
AST: 54 U/L — ABNORMAL HIGH (ref 15–41)
Albumin: 2.9 g/dL — ABNORMAL LOW (ref 3.5–5.0)
Alkaline Phosphatase: 253 U/L — ABNORMAL HIGH (ref 38–126)
Anion gap: 16 — ABNORMAL HIGH (ref 5–15)
BUN: 17 mg/dL (ref 8–23)
CO2: 23 mmol/L (ref 22–32)
Calcium: 9.4 mg/dL (ref 8.9–10.3)
Chloride: 92 mmol/L — ABNORMAL LOW (ref 98–111)
Creatinine, Ser: 0.95 mg/dL (ref 0.44–1.00)
GFR, Estimated: >60 mL/min (ref >60)
Glucose, Bld: 97 mg/dL (ref 70–99)
Potassium: 4 mmol/L (ref 3.5–5.1)
Sodium: 131 mmol/L — ABNORMAL LOW (ref 135–145)
Total Bilirubin: 0.8 mg/dL (ref 0.0–1.2)
Total Protein: 6.9 g/dL (ref 6.5–8.1)

## 2023-11-17 LAB — TSH: TSH: 93.092 u[IU]/mL — ABNORMAL HIGH (ref 0.350–4.500)

## 2023-11-17 LAB — SAMPLE TO BLOOD BANK

## 2023-11-17 LAB — MAGNESIUM: Magnesium: 1.6 mg/dL — ABNORMAL LOW (ref 1.7–2.4)

## 2023-11-17 MED ORDER — HEPARIN SOD (PORK) LOCK FLUSH 100 UNIT/ML IV SOLN
500.0000 [IU] | Freq: Once | INTRAVENOUS | Status: AC
  Administered 2023-11-17: 500 [IU] via INTRAVENOUS

## 2023-11-17 MED ORDER — SODIUM CHLORIDE 0.9% FLUSH
10.0000 mL | Freq: Once | INTRAVENOUS | Status: AC
  Administered 2023-11-17: 10 mL

## 2023-11-17 NOTE — Patient Instructions (Addendum)
 [facility] Discharge Instructions   You were seen and examined today by Dr. Cheree Cords.  He reviewed the results of your lab work which are normal/stable.   Continue Cabometyx  and Welireg  as prescribed.   We will see you back in 2 weeks. We will repeat lab work at that time.   Return as scheduled.    Thank you for choosing  Cancer Center at [facility] to provide your oncology and hematology care.  To afford each patient quality time with our provider, please arrive at least 15 minutes before your scheduled appointment time.   If you have a lab appointment with the Cancer Center please come in thru the Main Entrance and check in at the main information desk.  You need to re-schedule your appointment should you arrive 10 or more minutes late.  We strive to give you quality time with our providers, and arriving late affects you and other patients whose appointments are after yours.  Also, if you no show three or more times for appointments you may be dismissed from the clinic at the providers discretion.     Again, thank you for choosing [facility].  Our hope is that these requests will decrease the amount of time that you wait before being seen by our physicians.       _____________________________________________________________  Should you have questions after your visit to [facility], please contact our office at [phone] and follow the prompts.  Our office hours are 8:00 a.m. and 4:30 p.m. Monday - Friday.  Please note that voicemails left after 4:00 p.m. may not be returned until the following business day.  We are closed weekends and major holidays.  You do have access to a nurse 24-7, just call the main number to the clinic [phone] and do not press any options, hold on the line and a nurse will answer the phone.    For prescription refill requests, have your pharmacy contact our office and  allow 72 hours.    Due to Covid, you will need to wear a mask upon entering the hospital. If you do not have a mask, a mask will be given to you at the Main Entrance upon arrival. For doctor visits, patients may have 1 support person age 65 or older with them. For treatment visits, patients can not have anyone with them due to social distancing guidelines and our immunocompromised population.

## 2023-11-17 NOTE — Progress Notes (Signed)
 Geoffry Kill presented for Portacath access and flush with lab work and follow up appointment with Dr. Cheree Cords. Portacath located right chest wall accessed with  H 20 needle.  Good blood return present. Portacath flushed with 20ml NS and 500U/3ml Heparin  and needle removed intact. Discharged from clinic ambulatory in stable condition. Alert and oriented x 3. F/U with [facility] as scheduled.

## 2023-11-17 NOTE — Progress Notes (Signed)
 Patient is taking Cabometyx  and Welireg  as prescribed. She has not missed any doses and reports no side effects at this time.

## 2023-11-19 ENCOUNTER — Ambulatory Visit (INDEPENDENT_AMBULATORY_CARE_PROVIDER_SITE_OTHER): Payer: 59 | Admitting: Psychiatry

## 2023-11-19 DIAGNOSIS — F4323 Adjustment disorder with mixed anxiety and depressed mood: Secondary | ICD-10-CM

## 2023-11-19 NOTE — Progress Notes (Signed)
Virtual Visit via Telephone Note  I connected with Deanna Bush on 11/19/23 at 1:06 PM EST  by telephone and verified that I am speaking with the correct person using two identifiers.  Location: Patient: Home Provider: [facility] Outpatient  office    I discussed the limitations, risks, security and privacy concerns of performing an evaluation and management service by telephone and the availability of in person appointments. I also discussed with the patient that there may be a patient responsible charge related to this service. The patient expressed understanding and agreed to proceed.   I provided 43 minutes of non-face-to-face time during this encounter.   Adah Salvage, LCSW    THERAPIST PROGRESS NOTE  Session Time:  Wednesday  11/19/2023  1:06 PM - 1:49 PM   Participation Level: Active  Behavioral Response: CasualAlert/  Type of Therapy: Individual Therapy  Treatment Goals addressed: Have healthy grieving process around loss Begin verbalizing feelings associated with loss     ProgressTowards Goals: Progressing  Interventions: Supportive/CBT  Summary: Deanna Bush is a 63 y.o. female who p is referred for services by PCP Dr. Lodema Hong due to pt experiencing symptoms of anxiety along with grief and loss issues. Pt denies any psychiatric hospitalizations. Pt has no previous involvement in outpatient therapy.  Per patient's report her stepdaughter was diagnosed with colon cancer at age 31 and died in 10-24-2022.  Patient was her caretaker and says patient died at her home.  Patient reports her husband left her about 2 weeks ago for 69 year old after 30 years of marriage.  Patient reports additional stress regarding providing care for her 72 year old mother.  Patient also has her own health issues as she was diagnosed with kidney cancer 2 years ago and had chemo.  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 She currently is taking experimental drugs.   Patient reports crying spells, sadness, irritability, worry, anxiety, and sleeping difficulty.   Patient last was seen about 2 weeks ago. She reports continuing to feel better since last session. Per pt's report, her current treatment regimen still is working and she continues to experience increased energy. She now is able to give self a shower. She now is able to do household tasks in moderation like cooking while sitting on a stool and laundry. She also recently went to the grocery store. She is planning to attend church this coming Sunday and plans to try to resume driving next week. Pt reports she now is learning to acknowledge and accept she will experience feelings such as fear, sadness rather than just saying she is all right. She reports recently having conversation with her sons about her health. She reports feeling better since talking with them. She verbalizes what if thoughts regarding dying but reports acceptance with the use of her spirituality. She continues to focus on value driven activities and wants to engage in life.    Suicidal/Homicidal: Nowithout intent/plan  Therapist Response: Reviewed symptoms, praised and reinforced pt's involvement in activity, discussed effects , encouraged patient to follow through with her plans to continue involvement in value driven activity, discussed stressors, facilitated pt expressing thoughts and feelings, validated/normalized feelings regarding her condition, assisted pt identify/challenge/ and replace negative thought about expressing her true feelings with more rational thoughts, discussed rationale for and provided instructions on how to use an emotions journal, developed plan with pt to use, will send pt handouts via mail  Plan: Return again in 2 weeks.  Diagnosis: Adjustment disorder with mixed anxiety and depressed mood      Collaboration of Care: Primary Care Provider AEB patient is seeing PCP Dr. [name] for medication  management  Patient/Guardian was advised Release of Information must be obtained prior to any record release in order to collaborate their care with an outside provider. Patient/Guardian was advised if they have not already done so to contact the registration department to sign all necessary forms in order for [place] to release information regarding their care.   Consent: Patient/Guardian gives verbal consent for treatment and assignment of benefits for services provided during this visit. Patient/Guardian expressed understanding and agreed to proceed.   Adah Salvage, LCSW2/09/2024

## 2023-11-25 LAB — TSH: TSH: 72.4 u[IU]/mL — ABNORMAL HIGH (ref 0.450–4.500)

## 2023-11-25 LAB — LIPID PANEL
Chol/HDL Ratio: 4.5 {ratio} — ABNORMAL HIGH (ref 0.0–4.4)
Cholesterol, Total: 315 mg/dL — ABNORMAL HIGH (ref 100–199)
HDL: 70 mg/dL (ref >39)
LDL Chol Calc (NIH): 236 mg/dL — ABNORMAL HIGH (ref 0–99)
Triglycerides: 65 mg/dL (ref 0–149)
VLDL Cholesterol Cal: 9 mg/dL (ref 5–40)

## 2023-11-25 LAB — T4, FREE: Free T4: 1.08 ng/dL (ref 0.82–1.77)

## 2023-12-01 ENCOUNTER — Inpatient Hospital Stay: Payer: 59

## 2023-12-01 ENCOUNTER — Inpatient Hospital Stay (HOSPITAL_BASED_OUTPATIENT_CLINIC_OR_DEPARTMENT_OTHER): Payer: 59 | Admitting: Hematology

## 2023-12-01 VITALS — BP 139/74 | HR 89 | Temp 96.1°F | Resp 18 | Wt 149.7 lb

## 2023-12-01 DIAGNOSIS — C641 Malignant neoplasm of right kidney, except renal pelvis: Secondary | ICD-10-CM

## 2023-12-01 DIAGNOSIS — E039 Hypothyroidism, unspecified: Secondary | ICD-10-CM

## 2023-12-01 LAB — COMPREHENSIVE METABOLIC PANEL
ALT: 26 U/L (ref 0–44)
AST: 54 U/L — ABNORMAL HIGH (ref 15–41)
Albumin: 3.1 g/dL — ABNORMAL LOW (ref 3.5–5.0)
Alkaline Phosphatase: 244 U/L — ABNORMAL HIGH (ref 38–126)
Anion gap: 14 (ref 5–15)
BUN: 17 mg/dL (ref 8–23)
CO2: 25 mmol/L (ref 22–32)
Calcium: 9.2 mg/dL (ref 8.9–10.3)
Chloride: 95 mmol/L — ABNORMAL LOW (ref 98–111)
Creatinine, Ser: 0.79 mg/dL (ref 0.44–1.00)
GFR, Estimated: >60 mL/min (ref >60)
Glucose, Bld: 86 mg/dL (ref 70–99)
Potassium: 4 mmol/L (ref 3.5–5.1)
Sodium: 134 mmol/L — ABNORMAL LOW (ref 135–145)
Total Bilirubin: 1 mg/dL (ref 0.0–1.2)
Total Protein: 7.1 g/dL (ref 6.5–8.1)

## 2023-12-01 LAB — CBC WITH DIFFERENTIAL/PLATELET
Abs Immature Granulocytes: 0.04 10*3/uL (ref 0.00–0.07)
Basophils Absolute: 0 10*3/uL (ref 0.0–0.1)
Basophils Relative: 0 %
Eosinophils Absolute: 0.1 10*3/uL (ref 0.0–0.5)
Eosinophils Relative: 1 %
HCT: 28.8 % — ABNORMAL LOW (ref 36.0–46.0)
Hemoglobin: 9.2 g/dL — ABNORMAL LOW (ref 12.0–15.0)
Immature Granulocytes: 1 %
Lymphocytes Relative: 32 %
Lymphs Abs: 1.9 10*3/uL (ref 0.7–4.0)
MCH: 30.4 pg (ref 26.0–34.0)
MCHC: 31.9 g/dL (ref 30.0–36.0)
MCV: 95 fL (ref 80.0–100.0)
Monocytes Absolute: 0.4 10*3/uL (ref 0.1–1.0)
Monocytes Relative: 7 %
Neutro Abs: 3.5 10*3/uL (ref 1.7–7.7)
Neutrophils Relative %: 59 %
Platelets: 296 10*3/uL (ref 150–400)
RBC: 3.03 MIL/uL — ABNORMAL LOW (ref 3.87–5.11)
RDW: 19.5 % — ABNORMAL HIGH (ref 11.5–15.5)
WBC: 5.9 10*3/uL (ref 4.0–10.5)
nRBC: 0 % (ref 0.0–0.2)

## 2023-12-01 LAB — SAMPLE TO BLOOD BANK

## 2023-12-01 LAB — TSH: TSH: 49.145 u[IU]/mL — ABNORMAL HIGH (ref 0.350–4.500)

## 2023-12-01 LAB — MAGNESIUM: Magnesium: 1.6 mg/dL — ABNORMAL LOW (ref 1.7–2.4)

## 2023-12-01 MED ORDER — SODIUM CHLORIDE 0.9% FLUSH
10.0000 mL | Freq: Once | INTRAVENOUS | Status: AC
  Administered 2023-12-01: 10 mL via INTRAVENOUS

## 2023-12-01 MED ORDER — MAGNESIUM OXIDE -MG SUPPLEMENT 400 (240 MG) MG PO TABS: 400.0000 mg | ORAL_TABLET | Freq: Every day | ORAL | 4 refills | Status: DC

## 2023-12-01 MED ORDER — HEPARIN SOD (PORK) LOCK FLUSH 100 UNIT/ML IV SOLN
500.0000 [IU] | Freq: Once | INTRAVENOUS | Status: AC
  Administered 2023-12-01: 500 [IU] via INTRAVENOUS

## 2023-12-01 NOTE — Progress Notes (Signed)
 [facility] [address] [place] [address]    Clinic Day:  12/03/2023  Referring physician: Kerri Perches, MD  Patient Care Team: Kerri Perches, MD as PCP - General Mallipeddi, Orion Modest, MD as PCP - Cardiology (Cardiology) Doreatha Massed, MD as Medical Oncologist (Medical Oncology)   ASSESSMENT & PLAN:   Assessment: 1. Metastatic clear-cell renal cell carcinoma: - Right radical nephrectomy on 02/06/2021. - Pathology shows clear-cell RCC, grade 4, 12.5 cm, necrosis and rhabdoid features are identified.  Tumor extends into the and invades the wall of the vena cava (pT3c).  Vascular, ureteral and all margins of resection are negative for tumor. - CT scan abdomen with and without contrast on 05/29/2021 showed several nodules along the peritoneal surface of the right nephrectomy bed warranting close attention. - CTAP with and without contrast on 09/18/2021 showed progressive nodularity within the right nephrectomy bed, with enlarged nodules adjacent to the right hepatic lobe extending inferiorly along the right retroperitoneum with multiple enlarged enhancing nodules.  Direct metastatic invasion into the right hepatic lobe.  Small nodule at the right lung base favored to be benign. - IMDC: Intermediate risk group with 2 features including diagnosis less than 1 year and hemoglobin lower than normal. - Bone scan on 10/05/2021 with focal activity in the mental vertex of the mandible which could be inflammatory/traumatic/metastatic. - CT chest on 10/03/2021 showed several lung nodules scattered bilaterally some of which could be metastatic and many others could be due to mucoid impaction. - NGS testing did not show any targetable mutations.  TMB was low.  CCND3 amplified.  PBRM1 VUS present. - Right nephrectomy bed biopsy (10/22/2021): Clear-cell renal cell carcinoma with necrosis - 4 cycles of ipilimumab and nivolumab from 11/19/2021 through 01/24/2022 - CT CAP  on 02/14/2022: Numerous new and enlarged lung nodules.  Significant interval increase in the mass in the right renal bed.  New hypodense liver lesions. - Lenvatinib (12 mg) and everolimus 5 mg daily started on 03/13/2022, discontinued on 07/22/2023 due to progression. - Belzutifan 120 mg daily started on 07/29/2023, discontinued due to progression. - Cabozantinib 40 mg daily started on 10/20/2023.   2. Social/family history: - She lives at home with her husband and also takes care of her mother. - She works as a Child psychotherapist at [employer].  She quit smoking in 2004 and smoked half pack per day for 10 years. - Half sister (same mother) had kidney cancer at age 50.  Maternal grandfather had kidney cancer.  Sister died of lung cancer.  2 half sisters (same father) had breast cancers.  3.  Left breast stage I tubular adenocarcinoma: - She underwent left mastectomy on 06/11/2002.  0/6 lymph nodes positive.  ER 90%, PR 92%, Ki-67 6%, HER2 negative.  As per chart, declined antiestrogen therapy.    Plan: 1. Metastatic clear-cell RCC, intermediate risk by IMDC: - She is continuing to tolerate cabozantinib 40 mg daily very well. - Reviewed labs from today: AST elevated at 54.  Bilirubin normal.  Creatinine normal.  CBC shows anemia with hemoglobin 9.2.  Magnesium is 1.6.  Will start her on magnesium oxide daily. - Continue cabozantinib 40 mg daily.  RTC 4 weeks with repeat labs.  2.  Normocytic anemia: - Hemoglobin today is 9.2, down from 10.5.  This is from myelosuppression from cabozantinib.  Continue monitoring closely.  3.  Hypothyroidism: - She is taking Synthroid 125 mcg daily.  TSH has improved to 49.  She will follow-up with Dr. Fransico Him tomorrow.   4.  Hypertension: - Continue olmesartan/HCTZ.  Blood pressure today is 140/75.   5.  Right loin pain: - She does not report any pain.  She is not requiring hydrocodone.   6.  Fluid retention: - She has lost 6 pounds, mostly fluid weight.   Continue Lasix 20 mg daily.  She has 1+ edema today.  7.  Nutrition: - She has lost 6 pounds since last visit, part of it is fluid weight.  Recommend increasing Ensure high-protein twice daily.  She is drinking it once daily.    Orders Placed This Encounter  Procedures   CBC with Differential    Standing Status:   Future    Expected Date:   12/29/2023    Expiration Date:   11/30/2024   Comprehensive metabolic panel    Standing Status:   Future    Expected Date:   12/29/2023    Expiration Date:   11/30/2024   Magnesium    Standing Status:   Future    Expected Date:   12/29/2023    Expiration Date:   11/30/2024   TSH    Standing Status:   Future    Expected Date:   12/29/2023    Expiration Date:   11/30/2024      I,Katie Daubenspeck,acting as a scribe for Doreatha Massed, MD.,have documented all relevant documentation on the behalf of Doreatha Massed, MD,as directed by  Doreatha Massed, MD while in the presence of Doreatha Massed, MD.   I, Doreatha Massed MD, have reviewed the above documentation for accuracy and completeness, and I agree with the above.   Doreatha Massed, MD   2/26/20255:34 PM  CHIEF COMPLAINT:   Diagnosis: metastatic clear-cell renal cell carcinoma    Cancer Staging  Malignant neoplasm of female breast [facility]) Staging form: Breast, AJCC 6th Edition - Clinical: Stage I (T1b, N0, M0) - Signed by Ellouise Newer, PA on 08/16/2011  Renal cell cancer [facility]) Staging form: Kidney, AJCC 8th Edition - Clinical stage from 09/26/2021: Stage IV (cT3c, cNX, cM1) - Unsigned    Prior Therapy: 1. Right nephrectomy, 02/06/21 2. Nivolumab plus ipilimumab, 4 cycles, 11/19/21 - 01/24/22 3. lenvatinib and everolimus, 03/13/22 - 07/22/23 4. Belzutifan, 07/29/23 - 10/2023  Current Therapy:  Cabozantinib    HISTORY OF PRESENT ILLNESS:   Oncology History  Renal cell cancer (HCC)  06/20/2021 Initial Diagnosis   Renal cell carcinoma of right kidney (HCC)    11/19/2021 - 01/24/2022 Chemotherapy   Patient is on Treatment Plan : RENAL CELL CARCINOMA Nivolumab + Ipilimumab q21d / Nivolumab q28d      Genetic Testing   Negative genetic testing. No pathogenic variants identified on the [facility] Multi-Cancer Panel + RNA. VUS in PMS2 called c.1732C>A and in RECQL4 called c.2967G>A  Identified. The report date is 11/01/2021.   The Multi-Cancer Panel + RNA offered by [facility] includes sequencing and/or deletion duplication testing of the following 84 genes: AIP, ALK, APC, ATM, AXIN2,BAP1,  BARD1, BLM, BMPR1A, BRCA1, BRCA2, BRIP1, CASR, CDC73, CDH1, CDK4, CDKN1B, CDKN1C, CDKN2A (p14ARF), CDKN2A (p16INK4a), CEBPA, CHEK2, CTNNA1, DICER1, DIS3L2, EGFR (c.2369C>T, p.Thr790Met variant only), EPCAM (Deletion/duplication testing only), FH, FLCN, GATA2, GPC3, GREM1 (Promoter region deletion/duplication testing only), HOXB13 (c.251G>A, p.Gly84Glu), HRAS, KIT, MAX, MEN1, MET, MITF (c.952G>A, p.Glu318Lys variant only), MLH1, MSH2, MSH3, MSH6, MUTYH, NBN, NF1, NF2, NTHL1, PALB2, PDGFRA, PHOX2B, PMS2, POLD1, POLE, POT1, PRKAR1A, PTCH1, PTEN, RAD50, RAD51C, RAD51D, RB1, RECQL4, RET, RUNX1, SDHAF2, SDHA (sequence changes only),  SDHB, SDHC, SDHD, SMAD4, SMARCA4, SMARCB1, SMARCE1, STK11, SUFU, TERC, TERT, TMEM127, TP53, TSC1, TSC2, VHL, WRN and WT1.       INTERVAL HISTORY:   Deanna Bush is a 63 y.o. female presenting to clinic today for follow up of metastatic clear-cell renal cell carcinoma. She was last seen by me on 11/17/23.  Today, she states that she is doing well overall. Her appetite level is at 100%. Her energy level is at 80%.  PAST MEDICAL HISTORY:   Past Medical History: Past Medical History:  Diagnosis Date   Adenocarcinoma of breast (HCC)    left    Cellulitis of leg, left    Complication of anesthesia    Hard to wake up   Depression    Diabetes mellitus without complication (HCC)    Pt denies   Family history of breast cancer 08/16/2011   Family history of  breast cancer    Family history of colon cancer    Family history of kidney cancer    Family history of prostate cancer    Hyperlipidemia    Hypertension    Hypothyroidism    MRSA (methicillin resistant staph aureus) culture positive 08/19/2011   Pre-diabetes     Surgical History: Past Surgical History:  Procedure Laterality Date   BREAST SURGERY Left    mastectomy   CATARACT EXTRACTION W/PHACO Right 06/06/2023   Procedure: CATARACT EXTRACTION PHACO AND INTRAOCULAR LENS PLACEMENT (IOC);  Surgeon: Fabio Pierce, MD;  Location: [facility];  Service: Ophthalmology;  Laterality: Right;  CDE 5.61   CATARACT EXTRACTION W/PHACO Left 07/21/2023   Procedure: CATARACT EXTRACTION PHACO AND INTRAOCULAR LENS PLACEMENT (IOC);  Surgeon: Fabio Pierce, MD;  Location: [facility];  Service: Ophthalmology;  Laterality: Left;  CDE: 5.96   CESAREAN SECTION     x2   COLONOSCOPY N/A 03/03/2020   Procedure: COLONOSCOPY;  Surgeon: Corbin Ade, MD;  Location: [facility];  Service: Endoscopy;  Laterality: N/A;  12:00   IR IMAGING GUIDED PORT INSERTION  11/07/2021   left mastectomy     NEPHRECTOMY Right 02/06/2021   Procedure: NEPHRECTOMY- open radical;  Surgeon: Malen Gauze, MD;  Location: [facility];  Service: Urology;  Laterality: Right;   POLYPECTOMY  03/03/2020   Procedure: POLYPECTOMY;  Surgeon: Corbin Ade, MD;  Location: [facility];  Service: Endoscopy;;    Social History: Social History   Socioeconomic History   Marital status: Married    Spouse name: Not on file   Number of children: 2   Years of education: Not on file   Highest education level: GED or equivalent  Occupational History   Occupation: CNA  Tobacco Use   Smoking status: Former    Current packs/day: 0.00    Average packs/day: 0.5 packs/day for 18.0 years (9.0 ttl pk-yrs)    Types: Cigarettes    Start date: 07/01/1984    Quit date: 07/01/2002    Years since quitting: 21.4   Smokeless tobacco: Never  Vaping Use    Vaping status: Never Used  Substance and Sexual Activity   Alcohol use: No   Drug use: No   Sexual activity: Yes    Birth control/protection: None  Other Topics Concern   Not on file  Social History Narrative   Not on file   Social Drivers of Health   Financial Resource Strain: Medium Risk (08/14/2023)   Overall Financial Resource Strain (CARDIA)    Difficulty of Paying Living Expenses: Somewhat hard  Food Insecurity: No Food  Insecurity (08/14/2023)   Hunger Vital Sign    Worried About Running Out of Food in the Last Year: Never true    Ran Out of Food in the Last Year: Never true  Transportation Needs: No Transportation Needs (08/14/2023)   PRAPARE - Administrator, Civil Service (Medical): No    Lack of Transportation (Non-Medical): No  Physical Activity: Insufficiently Active (08/14/2023)   Exercise Vital Sign    Days of Exercise per Week: 1 day    Minutes of Exercise per Session: 10 min  Stress: No Stress Concern Present (08/14/2023)   [employer] of Occupational Health - Occupational Stress Questionnaire    Feeling of Stress : Only a little  Social Connections: Moderately Integrated (08/14/2023)   Social Connection and Isolation Panel [NHANES]    Frequency of Communication with Friends and Family: Twice a week    Frequency of Social Gatherings with Friends and Family: More than three times a week    Attends Religious Services: More than 4 times per year    Active Member of [employer] or Organizations: Yes    Attends Engineer, structural: More than 4 times per year    Marital Status: Separated  Intimate Partner Violence: Not on file    Family History: Family History  Problem Relation Age of Onset   Alcohol abuse Mother    Hypertension Mother    Diabetes Mother    Hyperlipidemia Mother    Colon cancer Maternal Aunt        dx 42s   Prostate cancer Maternal Uncle    Throat cancer Maternal Uncle    Kidney cancer Maternal Grandfather    Lung cancer  Half-Sister    Kidney cancer Half-Sister 72   Breast cancer Half-Sister 40   Breast cancer Half-Sister 55    Current Medications:  Current Outpatient Medications:    acetaminophen (TYLENOL) 500 MG tablet, Take 500 mg by mouth every 6 (six) hours as needed for moderate pain or headache., Disp: , Rfl:    atorvastatin (LIPITOR) 40 MG tablet, Take 1 tablet (40 mg total) by mouth daily., Disp: 90 tablet, Rfl: 1   belzutifan (WELIREG) 40 MG tablet, Take 3 tablets (120 mg total) by mouth daily., Disp: 90 tablet, Rfl: 1   benzonatate (TESSALON) 100 MG capsule, Take 1 capsule (100 mg total) by mouth 2 (two) times daily as needed for cough., Disp: 20 capsule, Rfl: 0   cabozantinib (CABOMETYX) 40 MG tablet, Take 1 tablet (40 mg total) by mouth daily. Take on an empty stomach, 1 hour before or 2 hours after meals., Disp: 30 tablet, Rfl: 1   EPINEPHrine 0.3 mg/0.3 mL IJ SOAJ injection, Inject 0.3 mg into the muscle as needed for anaphylaxis., Disp: 2 each, Rfl: 0   furosemide (LASIX) 20 MG tablet, TAKE 1 TABLET(20 MG) BY MOUTH DAILY AS NEEDED, Disp: 90 tablet, Rfl: 1   HYDROcodone-acetaminophen (NORCO) 5-325 MG tablet, Take 1 tablet by mouth every 6 (six) hours as needed for moderate pain (pain score 4-6)., Disp: 30 tablet, Rfl: 0   magnesium oxide (MAG-OX) 400 (240 Mg) MG tablet, Take 1 tablet (400 mg total) by mouth daily., Disp: 30 tablet, Rfl: 4   Multiple Vitamin (MULTIVITAMIN WITH MINERALS) TABS tablet, Take 1 tablet by mouth daily., Disp: , Rfl:    olmesartan-hydrochlorothiazide (BENICAR HCT) 40-25 MG tablet, TAKE 1 TABLET BY MOUTH DAILY, Disp: 30 tablet, Rfl: 1   UNABLE TO FIND, MASTECTOMY BRA AND PROTHESIS DX Z90.12, Disp: 6  each, Rfl: 2   UNABLE TO FIND, 6 mastectomy prosthesis and bras., Disp: 6 each, Rfl: 0   urea (CARMOL) 10 % cream, Apply topically 2 (two) times daily. Apply twice daily to hands, Disp: 71 g, Rfl: 0   levothyroxine (SYNTHROID) 125 MCG tablet, Take 1 tablet (125 mcg total) by  mouth daily before breakfast., Disp: 90 tablet, Rfl: 1 No current facility-administered medications for this visit.  Facility-Administered Medications Ordered in Other Visits:    heparin lock flush 100 unit/mL, 500 Units, Intravenous, Once, Doreatha Massed, MD   Allergies: Allergies  Allergen Reactions   Amlodipine Swelling    Leg edema   Sulfonamide Derivatives Itching   Yellow Jacket Venom [Bee Venom] Rash    urticaria    REVIEW OF SYSTEMS:   Review of Systems  Constitutional:  Negative for chills, fatigue and fever.  HENT:   Negative for lump/mass, mouth sores, nosebleeds, sore throat and trouble swallowing.   Eyes:  Negative for eye problems.  Respiratory:  Negative for cough and shortness of breath.   Cardiovascular:  Negative for chest pain, leg swelling and palpitations.  Gastrointestinal:  Negative for abdominal pain, constipation, diarrhea, nausea and vomiting.  Genitourinary:  Negative for bladder incontinence, difficulty urinating, dysuria, frequency, hematuria and nocturia.   Musculoskeletal:  Negative for arthralgias, back pain, flank pain, myalgias and neck pain.  Skin:  Negative for itching and rash.  Neurological:  Negative for dizziness, headaches and numbness.  Hematological:  Does not bruise/bleed easily.  Psychiatric/Behavioral:  Negative for depression, sleep disturbance and suicidal ideas. The patient is not nervous/anxious.   All other systems reviewed and are negative.    VITALS:   Blood pressure 139/74, pulse 89, temperature (!) 96.1 F (35.6 C), temperature source Tympanic, resp. rate 18, weight 149 lb 11.1 oz (67.9 kg), SpO2 95%.  Wt Readings from Last 3 Encounters:  12/02/23 148 lb 12.8 oz (67.5 kg)  12/01/23 149 lb 11.1 oz (67.9 kg)  11/17/23 155 lb 14.4 oz (70.7 kg)    Body mass index is 24.16 kg/m.  Performance status (ECOG): 1 - Symptomatic but completely ambulatory  PHYSICAL EXAM:   Physical Exam Vitals and nursing note  reviewed. Exam conducted with a chaperone present.  Constitutional:      Appearance: Normal appearance.  Cardiovascular:     Rate and Rhythm: Normal rate and regular rhythm.     Pulses: Normal pulses.     Heart sounds: Normal heart sounds.  Pulmonary:     Effort: Pulmonary effort is normal.     Breath sounds: Normal breath sounds.  Abdominal:     Palpations: Abdomen is soft. There is no hepatomegaly, splenomegaly or mass.     Tenderness: There is no abdominal tenderness.  Musculoskeletal:     Right lower leg: No edema.     Left lower leg: No edema.  Lymphadenopathy:     Cervical: No cervical adenopathy.     Right cervical: No superficial, deep or posterior cervical adenopathy.    Left cervical: No superficial, deep or posterior cervical adenopathy.     Upper Body:     Right upper body: No supraclavicular or axillary adenopathy.     Left upper body: No supraclavicular or axillary adenopathy.  Neurological:     General: No focal deficit present.     Mental Status: She is alert and oriented to person, place, and time.  Psychiatric:        Mood and Affect: Mood normal.  Behavior: Behavior normal.     LABS:   CBC     Component Value Date/Time   WBC 5.9 12/01/2023 0932   RBC 3.03 (L) 12/01/2023 0932   HGB 9.2 (L) 12/01/2023 0932   HGB 11.1 07/10/2021 1530   HCT 28.8 (L) 12/01/2023 0932   HCT 33.7 (L) 07/10/2021 1530   PLT 296 12/01/2023 0932   PLT 374 07/10/2021 1530   MCV 95.0 12/01/2023 0932   MCV 89 07/10/2021 1530   MCH 30.4 12/01/2023 0932   MCHC 31.9 12/01/2023 0932   RDW 19.5 (H) 12/01/2023 0932   RDW 13.0 07/10/2021 1530   LYMPHSABS 1.9 12/01/2023 0932   LYMPHSABS 2.5 07/10/2021 1530   MONOABS 0.4 12/01/2023 0932   EOSABS 0.1 12/01/2023 0932   EOSABS 0.0 07/10/2021 1530   BASOSABS 0.0 12/01/2023 0932   BASOSABS 0.0 07/10/2021 1530    CMP      Component Value Date/Time   NA 134 (L) 12/01/2023 0932   NA 139 07/21/2023 0905   K 4.0 12/01/2023  0932   CL 95 (L) 12/01/2023 0932   CO2 25 12/01/2023 0932   GLUCOSE 86 12/01/2023 0932   BUN 17 12/01/2023 0932   BUN 18 07/21/2023 0905   CREATININE 0.79 12/01/2023 0932   CREATININE 0.73 04/22/2019 0848   CALCIUM 9.2 12/01/2023 0932   PROT 7.1 12/01/2023 0932   PROT 6.5 07/21/2023 0905   ALBUMIN 3.1 (L) 12/01/2023 0932   ALBUMIN 3.8 (L) 07/21/2023 0905   AST 54 (H) 12/01/2023 0932   ALT 26 12/01/2023 0932   ALKPHOS 244 (H) 12/01/2023 0932   BILITOT 1.0 12/01/2023 0932   BILITOT 0.4 07/21/2023 0905   GFRNONAA >60 12/01/2023 0932   GFRNONAA 90 04/22/2019 0848   GFRAA 96 10/30/2020 0914   GFRAA 104 04/22/2019 0848     No results found for: "CEA1", "CEA" / No results found for: "CEA1", "CEA" No results found for: "PSA1" No results found for: "CAN199" No results found for: "CAN125"  No results found for: "TOTALPROTELP", "ALBUMINELP", "A1GS", "A2GS", "BETS", "BETA2SER", "GAMS", "MSPIKE", "SPEI" Lab Results  Component Value Date   TIBC 284 08/26/2023   TIBC 308 07/15/2023   TIBC 307 04/29/2023   FERRITIN 272 08/26/2023   FERRITIN 180 07/15/2023   FERRITIN 152 04/29/2023   IRONPCTSAT 17 08/26/2023   IRONPCTSAT 21 07/15/2023   IRONPCTSAT 20 04/29/2023   Lab Results  Component Value Date   LDH 178 07/15/2023   LDH 178 02/21/2022   LDH 152 09/27/2021     STUDIES:   No results found.

## 2023-12-01 NOTE — Progress Notes (Unsigned)
 Patient is taking Welireg and Cabometyx as prescribed. She has not missed any doses and reports no side effects at this time.

## 2023-12-01 NOTE — Patient Instructions (Addendum)
 [facility] at [facility] Discharge Instructions   You were seen and examined today by Dr. Ellin Saba.  He reviewed the results of your lab work which are normal/stable.   We will see you back in 4 weeks and repeat blood work then.   Return as scheduled.    Thank you for choosing [facility] at [facility] to provide your oncology and hematology care.  To afford each patient quality time with our provider, please arrive at least 15 minutes before your scheduled appointment time.   If you have a lab appointment with the Cancer Center please come in thru the Main Entrance and check in at the main information desk.  You need to re-schedule your appointment should you arrive 10 or more minutes late.  We strive to give you quality time with our providers, and arriving late affects you and other patients whose appointments are after yours.  Also, if you no show three or more times for appointments you may be dismissed from the clinic at the providers discretion.     Again, thank you for choosing [facility].  Our hope is that these requests will decrease the amount of time that you wait before being seen by our physicians.       _____________________________________________________________  Should you have questions after your visit to [facility], please contact our office at [phone] and follow the prompts.  Our office hours are 8:00 a.m. and 4:30 p.m. Monday - Friday.  Please note that voicemails left after 4:00 p.m. may not be returned until the following business day.  We are closed weekends and major holidays.  You do have access to a nurse 24-7, just call the main number to the clinic [phone] and do not press any options, hold on the line and a nurse will answer the phone.    For prescription refill requests, have your pharmacy contact our office and allow 72 hours.    Due to Covid, you will need to wear a  mask upon entering the hospital. If you do not have a mask, a mask will be given to you at the Main Entrance upon arrival. For doctor visits, patients may have 1 support person age 66 or older with them. For treatment visits, patients can not have anyone with them due to social distancing guidelines and our immunocompromised population.

## 2023-12-02 ENCOUNTER — Encounter: Payer: Self-pay | Admitting: "Endocrinology

## 2023-12-02 ENCOUNTER — Ambulatory Visit (INDEPENDENT_AMBULATORY_CARE_PROVIDER_SITE_OTHER): Payer: 59 | Admitting: "Endocrinology

## 2023-12-02 VITALS — BP 136/78 | HR 88 | Ht 66.0 in | Wt 148.8 lb

## 2023-12-02 DIAGNOSIS — E119 Type 2 diabetes mellitus without complications: Secondary | ICD-10-CM

## 2023-12-02 DIAGNOSIS — E782 Mixed hyperlipidemia: Secondary | ICD-10-CM | POA: Diagnosis not present

## 2023-12-02 DIAGNOSIS — I10 Essential (primary) hypertension: Secondary | ICD-10-CM | POA: Diagnosis not present

## 2023-12-02 DIAGNOSIS — E890 Postprocedural hypothyroidism: Secondary | ICD-10-CM | POA: Diagnosis not present

## 2023-12-02 LAB — POCT GLYCOSYLATED HEMOGLOBIN (HGB A1C): HbA1c, POC (controlled diabetic range): 5.6 % (ref 0.0–7.0)

## 2023-12-02 LAB — T3: T3, Total: 45 ng/dL — ABNORMAL LOW (ref 71–180)

## 2023-12-02 MED ORDER — LEVOTHYROXINE SODIUM 125 MCG PO TABS: 125.0000 ug | ORAL_TABLET | Freq: Every day | ORAL | 1 refills | Status: DC

## 2023-12-02 NOTE — Patient Instructions (Signed)

## 2023-12-02 NOTE — Progress Notes (Unsigned)
 12/02/2023, 10:48 AM  Endocrinology follow-up note   ORETHA WEISMANN is a 63 y.o.-year-old female patient being seen for follow-up after she was seen in consultation for hypothyroidism. PMD:  Kerri Perches, MD.   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    Cellulitis of leg, left    Complication of anesthesia    Hard to wake up   Depression    Diabetes mellitus without complication (HCC)    Pt denies   Family history of breast cancer 08/16/2011   Family history of breast cancer    Family history of colon cancer    Family history of kidney cancer    Family history of prostate cancer    Hyperlipidemia    Hypertension    Hypothyroidism    MRSA (methicillin resistant staph aureus) culture positive 08/19/2011   Pre-diabetes     Past Surgical History:  Procedure Laterality Date   BREAST SURGERY Left    mastectomy   CATARACT EXTRACTION W/PHACO Right 06/06/2023   Procedure: CATARACT EXTRACTION PHACO AND INTRAOCULAR LENS PLACEMENT (IOC);  Surgeon: Fabio Pierce, MD;  Location: [facility] ORS;  Service: Ophthalmology;  Laterality: Right;  CDE 5.61   CATARACT EXTRACTION W/PHACO Left 07/21/2023   Procedure: CATARACT EXTRACTION PHACO AND INTRAOCULAR LENS PLACEMENT (IOC);  Surgeon: Fabio Pierce, MD;  Location: [facility] ORS;  Service: Ophthalmology;  Laterality: Left;  CDE: 5.96   CESAREAN SECTION     x2   COLONOSCOPY N/A 03/03/2020   Procedure: COLONOSCOPY;  Surgeon: Corbin Ade, MD;  Location: [facility] ENDO SUITE;  Service: Endoscopy;  Laterality: N/A;  12:00   IR IMAGING GUIDED PORT INSERTION  11/07/2021   left mastectomy     NEPHRECTOMY Right 02/06/2021   Procedure: NEPHRECTOMY- open radical;  Surgeon: Malen Gauze, MD;  Location: [facility] ORS;  Service: Urology;  Laterality: Right;   POLYPECTOMY  03/03/2020   Procedure: POLYPECTOMY;  Surgeon: Corbin Ade, MD;  Location: [facility]  ENDO SUITE;  Service: Endoscopy;;    Social History   Socioeconomic History   Marital status: Married    Spouse name: Not on file   Number of children: 2   Years of education: Not on file   Highest education level: GED or equivalent  Occupational History   Occupation: CNA  Tobacco Use   Smoking status: Former    Current packs/day: 0.00    Average packs/day: 0.5 packs/day for 18.0 years (9.0 ttl pk-yrs)    Types: Cigarettes    Start date: 07/01/1984    Quit date: 07/01/2002    Years since quitting: 21.4   Smokeless tobacco: Never  Vaping Use   Vaping status: Never Used  Substance and Sexual Activity   Alcohol use: No   Drug use: No   Sexual activity: Yes    Birth control/protection: None  Other Topics Concern   Not on file  Social History Narrative   Not on file   Social Drivers of Health   Financial Resource Strain: Medium Risk (08/14/2023)  Overall Financial Resource Strain (CARDIA)    Difficulty of Paying Living Expenses: Somewhat hard  Food Insecurity: No Food Insecurity (08/14/2023)   Hunger Vital Sign    Worried About Running Out of Food in the Last Year: Never true    Ran Out of Food in the Last Year: Never true  Transportation Needs: No Transportation Needs (08/14/2023)   PRAPARE - Administrator, Civil Service (Medical): No    Lack of Transportation (Non-Medical): No  Physical Activity: Insufficiently Active (08/14/2023)   Exercise Vital Sign    Days of Exercise per Week: 1 day    Minutes of Exercise per Session: 10 min  Stress: No Stress Concern Present (08/14/2023)   [employer] of Occupational Health - Occupational Stress Questionnaire    Feeling of Stress : Only a little  Social Connections: Moderately Integrated (08/14/2023)   Social Connection and Isolation Panel [NHANES]    Frequency of Communication with Friends and Family: Twice a week    Frequency of Social Gatherings with Friends and Family: More than three times a week    Attends  Religious Services: More than 4 times per year    Active Member of [employer] or Organizations: Yes    Attends Engineer, structural: More than 4 times per year    Marital Status: Separated    Family History  Problem Relation Age of Onset   Alcohol abuse Mother    Hypertension Mother    Diabetes Mother    Hyperlipidemia Mother    Colon cancer Maternal Aunt        dx 32s   Prostate cancer Maternal Uncle    Throat cancer Maternal Uncle    Kidney cancer Maternal Grandfather    Lung cancer Half-Sister    Kidney cancer Half-Sister 74   Breast cancer Half-Sister 83   Breast cancer Half-Sister 20    Outpatient Encounter Medications as of 12/02/2023  Medication Sig   acetaminophen (TYLENOL) 500 MG tablet Take 500 mg by mouth every 6 (six) hours as needed for moderate pain or headache.   atorvastatin (LIPITOR) 40 MG tablet Take 1 tablet (40 mg total) by mouth daily.   belzutifan (WELIREG) 40 MG tablet Take 3 tablets (120 mg total) by mouth daily.   benzonatate (TESSALON) 100 MG capsule Take 1 capsule (100 mg total) by mouth 2 (two) times daily as needed for cough.   cabozantinib (CABOMETYX) 40 MG tablet Take 1 tablet (40 mg total) by mouth daily. Take on an empty stomach, 1 hour before or 2 hours after meals.   EPINEPHrine 0.3 mg/0.3 mL IJ SOAJ injection Inject 0.3 mg into the muscle as needed for anaphylaxis.   furosemide (LASIX) 20 MG tablet TAKE 1 TABLET(20 MG) BY MOUTH DAILY AS NEEDED   HYDROcodone-acetaminophen (NORCO) 5-325 MG tablet Take 1 tablet by mouth every 6 (six) hours as needed for moderate pain (pain score 4-6).   levothyroxine (SYNTHROID) 125 MCG tablet Take 1 tablet (125 mcg total) by mouth daily before breakfast.   magnesium oxide (MAG-OX) 400 (240 Mg) MG tablet Take 1 tablet (400 mg total) by mouth daily.   Multiple Vitamin (MULTIVITAMIN WITH MINERALS) TABS tablet Take 1 tablet by mouth daily.   olmesartan-hydrochlorothiazide (BENICAR HCT) 40-25 MG tablet TAKE 1 TABLET  BY MOUTH DAILY   UNABLE TO FIND MASTECTOMY BRA AND PROTHESIS DX Z90.12   UNABLE TO FIND 6 mastectomy prosthesis and bras.   urea (CARMOL) 10 % cream Apply topically 2 (two) times daily.  Apply twice daily to hands   [DISCONTINUED] levothyroxine (SYNTHROID) 125 MCG tablet Take 1 tablet (125 mcg total) by mouth daily before breakfast.   Facility-Administered Encounter Medications as of 12/02/2023  Medication   heparin lock flush 100 unit/mL    ALLERGIES: Allergies  Allergen Reactions   Amlodipine Swelling    Leg edema   Sulfonamide Derivatives Itching   Yellow Jacket Venom [Bee Venom] Rash    urticaria   VACCINATION STATUS: Immunization History  Administered Date(s) Administered   Influenza Split 08/06/2012   Influenza, Seasonal, Injecte, Preservative Fre 08/14/2023   Influenza,inj,Quad PF,6+ Mos 07/22/2018, 06/28/2019, 06/28/2020, 07/10/2021   Influenza-Unspecified 07/07/2022   Moderna Sars-Covid-2 Vaccination 12/07/2019, 01/07/2020, 08/09/2020, 04/05/2021   Pneumococcal Conjugate-13 03/29/2015   Pneumococcal Polysaccharide-23 04/18/2021   Tdap 03/12/2011, 07/12/2021   Zoster Recombinant(Shingrix) 05/05/2018, 07/02/2018     HPI    DEFNE GERLING  is a patient with the above medical history.  Her history starts at approximate age of 35 when she had Graves' disease which required RAI thyroid ablation.  She is currently on levothyroxine 125 mcg p.o. daily before breakfast.   She was also supposed to be on Lipitor 40 mg p.o. nightly for treatment of hyperlipidemia.  Admittedly, this patient had withdrawn her treatment on her own for several weeks.  Her previsit labs show evidence of under replacement with thyroid hormone and worsening dyslipidemia.      She feels fatigued.    -She recently underwent right nephrectomy for renal cell cell carcinoma.    She denies fatigue, palpitations, tremors, heat/cold intolerance. -She reports fluctuating body weight. -She denies dysphagia,  shortness of breath, no voice change.  She underwent thyroid ultrasound recently which did not show significant goiter , nor any discrete thyroid nodules.    she has family history of  thyroid disorders in her mother, but no family history of thyroid cancer.  -He is a former smoker.  She takes Lipitor 20 mg p.o. nightly.  Her LDL has improved to 145.  Her medical history also includes hyperlipidemia, type 2 diabetes.  She is on lifestyle management for her diabetes.  Her point-of-care A1c is 5.6%,  overall improving from 6.6%.    ROS:  Limited as above.   Physical Exam: BP 136/78   Pulse 88   Ht 5\' 6"  (1.676 m)   Wt 148 lb 12.8 oz (67.5 kg)   BMI 24.02 kg/m  Wt Readings from Last 3 Encounters:  12/02/23 148 lb 12.8 oz (67.5 kg)  12/01/23 149 lb 11.1 oz (67.9 kg)  11/17/23 155 lb 14.4 oz (70.7 kg)    Constitutional:  Body mass index is 24.02 kg/m., not in acute distress, normal state of mind   CMP ( most recent) CMP     Component Value Date/Time   NA 134 (L) 12/01/2023 0932   NA 139 07/21/2023 0905   K 4.0 12/01/2023 0932   CL 95 (L) 12/01/2023 0932   CO2 25 12/01/2023 0932   GLUCOSE 86 12/01/2023 0932   BUN 17 12/01/2023 0932   BUN 18 07/21/2023 0905   CREATININE 0.79 12/01/2023 0932   CREATININE 0.73 04/22/2019 0848   CALCIUM 9.2 12/01/2023 0932   PROT 7.1 12/01/2023 0932   PROT 6.5 07/21/2023 0905   ALBUMIN 3.1 (L) 12/01/2023 0932   ALBUMIN 3.8 (L) 07/21/2023 0905   AST 54 (H) 12/01/2023 0932   ALT 26 12/01/2023 0932   ALKPHOS 244 (H) 12/01/2023 0932   BILITOT 1.0 12/01/2023 0932  BILITOT 0.4 07/21/2023 0905   GFRNONAA >60 12/01/2023 0932   GFRNONAA 90 04/22/2019 0848   GFRAA 96 10/30/2020 0914   GFRAA 104 04/22/2019 0848     Diabetic Labs (most recent): Lab Results  Component Value Date   HGBA1C 5.6 12/02/2023   HGBA1C 5.8 07/28/2023   HGBA1C 5.9 01/22/2023   MICROALBUR 0.2 10/23/2018   MICROALBUR <0.2 06/17/2017   MICROALBUR <3.0 (H)  04/03/2016     Lipid Panel ( most recent) Lipid Panel     Component Value Date/Time   CHOL 315 (H) 11/24/2023 1018   TRIG 65 11/24/2023 1018   HDL 70 11/24/2023 1018   CHOLHDL 4.5 (H) 11/24/2023 1018   CHOLHDL 2.7 10/23/2018 1148   VLDL 7 08/12/2016 0913   LDLCALC 236 (H) 11/24/2023 1018   LDLCALC 100 (H) 10/23/2018 1148   LABVLDL 9 11/24/2023 1018       Lab Results  Component Value Date   TSH 49.145 (H) 12/01/2023   TSH 72.400 (H) 11/24/2023   TSH 93.092 (H) 11/17/2023   TSH 36.100 (H) 07/21/2023   TSH 57.200 (H) 01/13/2023   TSH 24.400 (H) 06/27/2022   TSH 10.365 (H) 03/18/2022   TSH 6.639 (H) 02/21/2022   TSH 3.486 01/24/2022   TSH 3.706 01/03/2022   FREET4 1.08 11/24/2023   FREET4 1.13 07/21/2023   FREET4 0.94 01/13/2023   FREET4 1.01 06/27/2022   FREET4 1.25 02/28/2022   FREET4 1.44 08/28/2021   FREET4 1.30 09/04/2020   FREET4 1.1 03/28/2020   FREET4 0.7 (L) 01/03/2020       ASSESSMENT: 1. Hypothyroidism   2.  Type 2 diabetes  3.  Hyperlipidemia  4.  Hypertension  PLAN:  -Her previsit thyroid function tests are consistent with significant under replacement, however this is due to treatment withdrawal.  I have reemphasized and urged patient to resume her levothyroxine 125 mcg p.o. daily before breakfast.     - We discussed about the correct intake of her thyroid hormone, on empty stomach at fasting, with water, separated by at least 30 minutes from breakfast and other medications,  and separated by more than 4 hours from calcium, iron, multivitamins, acid reflux medications (PPIs). -Patient is made aware of the fact that thyroid hormone replacement is needed for life, dose to be adjusted by periodic monitoring of thyroid function tests.    -Her recent thyroid ultrasound was reviewed, no goiter, no discrete nodules. -She has controlled type 2 diabetes with point-of-care A1c of 5.6%, overall improving from 6.6%.  She is not on medications, and will not be  considered for medication intervention at this time.    In light of her concurrent hyperlipidemia, she is still a good candidate for lifestyle medicine.  However, she is urged to start her atorvastatin 40 mg p.o. nightly.  Side effects and precautions discussed with her.   - she acknowledges that there is a room for improvement in her food and drink choices. - Suggestion is made for her to avoid simple carbohydrates  from her diet including Cakes, Sweet Desserts, Ice Cream, Soda (diet and regular), Sweet Tea, Candies, Chips, Cookies, Store Bought Juices, Alcohol , Artificial Sweeteners,  Coffee Creamer, and "Sugar-free" Products, Lemonade. This will help patient to have more stable blood glucose profile and potentially avoid unintended weight gain.   She promises to continue to do better on her diet and engaging with lifestyle medicine.  Her blood pressure is better controlled today at 136/78 mmHg .  She is encouraged  to be consistent contact and take her blood pressure medications every morning.   She has olmesartan-hydrochlorothiazide 40-25 mg p.o. daily.  She is advised to maintain close follow-up with her PMD Dr. [name].    I spent  26  minutes in the care of the patient today including review of labs from Thyroid Function, CMP, and other relevant labs ; imaging/biopsy records (current and previous including abstractions from other facilities); face-to-face time discussing  her lab results and symptoms, medications doses, her options of short and long term treatment based on the latest standards of care / guidelines;   and documenting the encounter.  Bronwen Betters  participated in the discussions, expressed understanding, and voiced agreement with the above plans.  All questions were answered to her satisfaction. she is encouraged to contact clinic should she have any questions or concerns prior to her return visit.    Return in about 4 months (around 03/31/2024) for Fasting Labs   in AM B4 8, A1c -NV.  Marquis Lunch, MD [facility] [address] [place], [place] [address] Phone: [phone]  Fax: [phone]   12/02/2023, 10:48 AM  This note was partially dictated with voice recognition software. Similar sounding words can be transcribed inadequately or may not  be corrected upon review.

## 2023-12-03 DIAGNOSIS — C641 Malignant neoplasm of right kidney, except renal pelvis: Secondary | ICD-10-CM | POA: Diagnosis not present

## 2023-12-03 LAB — MISC LABCORP TEST (SEND OUT): Labcorp test code: 1149

## 2023-12-08 ENCOUNTER — Other Ambulatory Visit: Payer: Self-pay | Admitting: Hematology

## 2023-12-12 ENCOUNTER — Encounter: Payer: Self-pay | Admitting: Family Medicine

## 2023-12-12 ENCOUNTER — Ambulatory Visit (INDEPENDENT_AMBULATORY_CARE_PROVIDER_SITE_OTHER): Payer: 59 | Admitting: Family Medicine

## 2023-12-12 VITALS — BP 149/78 | HR 90 | Resp 16 | Ht 66.0 in | Wt 148.0 lb

## 2023-12-12 DIAGNOSIS — Z91030 Bee allergy status: Secondary | ICD-10-CM

## 2023-12-12 DIAGNOSIS — Z0001 Encounter for general adult medical examination with abnormal findings: Secondary | ICD-10-CM | POA: Diagnosis not present

## 2023-12-12 MED ORDER — EPINEPHRINE 0.3 MG/0.3ML IJ SOAJ: 0.3000 mg | INTRAMUSCULAR | 0 refills | Status: AC | PRN

## 2023-12-12 NOTE — Progress Notes (Signed)
      Deanna Bush     MRN: [id number]      DOB: 1961-08-18  Chief Complaint  Patient presents with   Annual Exam    HPI: Patient is in for annual physical exam. No other health concerns are expressed or addressed at the visit.Blood pressure , though elevated will be monitored , no med change today, expected rise in BP on current chemo Recent labs,  are reviewed. Immunization is reviewed , and  declines covid vaccine.   PE: BP (!) 149/78   Pulse 90   Resp 16   Ht 5\' 6"  (1.676 m)   Wt 148 lb (67.1 kg)   SpO2 94%   BMI 23.89 kg/m   Pleasant  female, alert and oriented x 3, in no cardio-pulmonary distress. Afebrile. HEENT No facial trauma or asymetry. Sinuses non tender.  Extra occullar muscles intact.. External ears normal, . Neck: supple, supraclavicular  adenopathy,no JVD or thyromegaly.No bruits.  Chest: Clear to ascultation bilaterally.decreased air entry in bases Non tender to palpation  Breast: Not examined, asymptomatic and mammogram is up to date Left mastectomy,  Cardiovascular system; Heart sounds normal,  S1 and  S2 ,no S3.  No murmur, or thrill.  Peripheral pulses normal.  Abdomen: Soft, non tender, hepatomegaly. Bowel sounds normal. No guarding, tenderness or rebound.      Musculoskeletal exam: Full ROM of spine, hips , shoulders and knees.   Neurologic: Cranial nerves 2 to 12 intact. Power, tone ,sensation  normal throughout. No disturbance in gait. No tremor.  Skin: Intact, no ulceration, erythema , scaling or rash noted. Pigmentation normal throughout  Psych; Normal mood and affect. Judgement and concentration normal   Assessment & Plan:  No problem-specific Assessment & Plan notes found for this encounter.

## 2023-12-12 NOTE — Patient Instructions (Signed)
 F/u in 4.months, call if you need me sooner  Check blood pressure regularly, if maintains over 140 let me know , you will need additional medication  Thanks for choosing [facility], we consider it a privelige to serve you.

## 2023-12-15 NOTE — Assessment & Plan Note (Signed)
 Annual exam as documented. . Immunization and cancer screening needs are specifically addressed at this visit.

## 2023-12-29 ENCOUNTER — Other Ambulatory Visit (HOSPITAL_COMMUNITY): Payer: Self-pay

## 2023-12-29 ENCOUNTER — Encounter: Payer: Self-pay | Admitting: Hematology

## 2023-12-29 ENCOUNTER — Telehealth: Payer: Self-pay | Admitting: Pharmacy Technician

## 2023-12-29 ENCOUNTER — Inpatient Hospital Stay: Payer: Self-pay | Attending: Hematology | Admitting: Hematology

## 2023-12-29 ENCOUNTER — Inpatient Hospital Stay: Payer: Self-pay

## 2023-12-29 VITALS — BP 148/78 | HR 92 | Temp 97.0°F | Resp 18 | Wt 147.0 lb

## 2023-12-29 DIAGNOSIS — R609 Edema, unspecified: Secondary | ICD-10-CM | POA: Diagnosis not present

## 2023-12-29 DIAGNOSIS — Z8051 Family history of malignant neoplasm of kidney: Secondary | ICD-10-CM | POA: Diagnosis not present

## 2023-12-29 DIAGNOSIS — Z8042 Family history of malignant neoplasm of prostate: Secondary | ICD-10-CM | POA: Diagnosis not present

## 2023-12-29 DIAGNOSIS — C787 Secondary malignant neoplasm of liver and intrahepatic bile duct: Secondary | ICD-10-CM

## 2023-12-29 DIAGNOSIS — Z79890 Hormone replacement therapy (postmenopausal): Secondary | ICD-10-CM | POA: Diagnosis not present

## 2023-12-29 DIAGNOSIS — I10 Essential (primary) hypertension: Secondary | ICD-10-CM | POA: Diagnosis not present

## 2023-12-29 DIAGNOSIS — Z905 Acquired absence of kidney: Secondary | ICD-10-CM | POA: Diagnosis not present

## 2023-12-29 DIAGNOSIS — Z87891 Personal history of nicotine dependence: Secondary | ICD-10-CM | POA: Insufficient documentation

## 2023-12-29 DIAGNOSIS — D649 Anemia, unspecified: Secondary | ICD-10-CM | POA: Diagnosis not present

## 2023-12-29 DIAGNOSIS — Z803 Family history of malignant neoplasm of breast: Secondary | ICD-10-CM | POA: Insufficient documentation

## 2023-12-29 DIAGNOSIS — C641 Malignant neoplasm of right kidney, except renal pelvis: Secondary | ICD-10-CM | POA: Insufficient documentation

## 2023-12-29 DIAGNOSIS — E039 Hypothyroidism, unspecified: Secondary | ICD-10-CM | POA: Insufficient documentation

## 2023-12-29 DIAGNOSIS — Z853 Personal history of malignant neoplasm of breast: Secondary | ICD-10-CM | POA: Insufficient documentation

## 2023-12-29 DIAGNOSIS — Z800 Family history of malignant neoplasm of digestive organs: Secondary | ICD-10-CM | POA: Insufficient documentation

## 2023-12-29 DIAGNOSIS — C50912 Malignant neoplasm of unspecified site of left female breast: Secondary | ICD-10-CM

## 2023-12-29 DIAGNOSIS — Z9012 Acquired absence of left breast and nipple: Secondary | ICD-10-CM | POA: Insufficient documentation

## 2023-12-29 DIAGNOSIS — Z801 Family history of malignant neoplasm of trachea, bronchus and lung: Secondary | ICD-10-CM | POA: Diagnosis not present

## 2023-12-29 DIAGNOSIS — Z95828 Presence of other vascular implants and grafts: Secondary | ICD-10-CM

## 2023-12-29 LAB — CBC WITH DIFFERENTIAL/PLATELET
Abs Immature Granulocytes: 0.02 10*3/uL (ref 0.00–0.07)
Basophils Absolute: 0 10*3/uL (ref 0.0–0.1)
Basophils Relative: 0 %
Eosinophils Absolute: 0.1 10*3/uL (ref 0.0–0.5)
Eosinophils Relative: 1 %
HCT: 26.3 % — ABNORMAL LOW (ref 36.0–46.0)
Hemoglobin: 8.2 g/dL — ABNORMAL LOW (ref 12.0–15.0)
Immature Granulocytes: 0 %
Lymphocytes Relative: 32 %
Lymphs Abs: 1.9 10*3/uL (ref 0.7–4.0)
MCH: 30.8 pg (ref 26.0–34.0)
MCHC: 31.2 g/dL (ref 30.0–36.0)
MCV: 98.9 fL (ref 80.0–100.0)
Monocytes Absolute: 0.5 10*3/uL (ref 0.1–1.0)
Monocytes Relative: 8 %
Neutro Abs: 3.5 10*3/uL (ref 1.7–7.7)
Neutrophils Relative %: 59 %
Platelets: 305 10*3/uL (ref 150–400)
RBC: 2.66 MIL/uL — ABNORMAL LOW (ref 3.87–5.11)
RDW: 18.1 % — ABNORMAL HIGH (ref 11.5–15.5)
WBC: 5.9 10*3/uL (ref 4.0–10.5)
nRBC: 0 % (ref 0.0–0.2)

## 2023-12-29 LAB — SAMPLE TO BLOOD BANK

## 2023-12-29 LAB — COMPREHENSIVE METABOLIC PANEL
ALT: 29 U/L (ref 0–44)
AST: 54 U/L — ABNORMAL HIGH (ref 15–41)
Albumin: 3.2 g/dL — ABNORMAL LOW (ref 3.5–5.0)
Alkaline Phosphatase: 199 U/L — ABNORMAL HIGH (ref 38–126)
Anion gap: 14 (ref 5–15)
BUN: 22 mg/dL (ref 8–23)
CO2: 23 mmol/L (ref 22–32)
Calcium: 9.6 mg/dL (ref 8.9–10.3)
Chloride: 95 mmol/L — ABNORMAL LOW (ref 98–111)
Creatinine, Ser: 0.87 mg/dL (ref 0.44–1.00)
GFR, Estimated: >60 mL/min (ref >60)
Glucose, Bld: 92 mg/dL (ref 70–99)
Potassium: 4.2 mmol/L (ref 3.5–5.1)
Sodium: 132 mmol/L — ABNORMAL LOW (ref 135–145)
Total Bilirubin: 0.5 mg/dL (ref 0.0–1.2)
Total Protein: 7.2 g/dL (ref 6.5–8.1)

## 2023-12-29 LAB — TSH: TSH: 29.357 u[IU]/mL — ABNORMAL HIGH (ref 0.350–4.500)

## 2023-12-29 LAB — MAGNESIUM: Magnesium: 1.7 mg/dL (ref 1.7–2.4)

## 2023-12-29 MED ORDER — HEPARIN SOD (PORK) LOCK FLUSH 100 UNIT/ML IV SOLN
500.0000 [IU] | Freq: Once | INTRAVENOUS | Status: AC
  Administered 2023-12-29: 500 [IU] via INTRAVENOUS

## 2023-12-29 MED ORDER — SODIUM CHLORIDE 0.9% FLUSH
10.0000 mL | INTRAVENOUS | Status: DC | PRN
  Administered 2023-12-29: 10 mL via INTRAVENOUS

## 2023-12-29 NOTE — Patient Instructions (Addendum)
 [facility] at [facility] Discharge Instructions   You were seen and examined today by Dr. Ellin Saba.  He reviewed the results of your lab work which are normal/stable.   We will see you back in 4 weeks. We will repeat lab work and a CT scan prior to this visit.    Return as scheduled.    Thank you for choosing [facility] at [facility] to provide your oncology and hematology care.  To afford each patient quality time with our provider, please arrive at least 15 minutes before your scheduled appointment time.   If you have a lab appointment with the Cancer Center please come in thru the Main Entrance and check in at the main information desk.  You need to re-schedule your appointment should you arrive 10 or more minutes late.  We strive to give you quality time with our providers, and arriving late affects you and other patients whose appointments are after yours.  Also, if you no show three or more times for appointments you may be dismissed from the clinic at the providers discretion.     Again, thank you for choosing [facility].  Our hope is that these requests will decrease the amount of time that you wait before being seen by our physicians.       _____________________________________________________________  Should you have questions after your visit to [facility], please contact our office at [phone] and follow the prompts.  Our office hours are 8:00 a.m. and 4:30 p.m. Monday - Friday.  Please note that voicemails left after 4:00 p.m. may not be returned until the following business day.  We are closed weekends and major holidays.  You do have access to a nurse 24-7, just call the main number to the clinic [phone] and do not press any options, hold on the line and a nurse will answer the phone.    For prescription refill requests, have your pharmacy contact our office and allow 72 hours.    Due to  Covid, you will need to wear a mask upon entering the hospital. If you do not have a mask, a mask will be given to you at the Main Entrance upon arrival. For doctor visits, patients may have 1 support person age 40 or older with them. For treatment visits, patients can not have anyone with them due to social distancing guidelines and our immunocompromised population.

## 2023-12-29 NOTE — Progress Notes (Signed)
 [facility] [address] [place] [address]    Clinic Day:  12/29/2023  Referring physician: Kerri Perches, MD  Patient Care Team: Kerri Perches, MD as PCP - General Mallipeddi, Orion Modest, MD as PCP - Cardiology (Cardiology) Doreatha Massed, MD as Medical Oncologist (Medical Oncology)   ASSESSMENT & PLAN:   Assessment: 1. Metastatic clear-cell renal cell carcinoma: - Right radical nephrectomy on 02/06/2021. - Pathology shows clear-cell RCC, grade 4, 12.5 cm, necrosis and rhabdoid features are identified.  Tumor extends into the and invades the wall of the vena cava (pT3c).  Vascular, ureteral and all margins of resection are negative for tumor. - CT scan abdomen with and without contrast on 05/29/2021 showed several nodules along the peritoneal surface of the right nephrectomy bed warranting close attention. - CTAP with and without contrast on 09/18/2021 showed progressive nodularity within the right nephrectomy bed, with enlarged nodules adjacent to the right hepatic lobe extending inferiorly along the right retroperitoneum with multiple enlarged enhancing nodules.  Direct metastatic invasion into the right hepatic lobe.  Small nodule at the right lung base favored to be benign. - IMDC: Intermediate risk group with 2 features including diagnosis less than 1 year and hemoglobin lower than normal. - Bone scan on 10/05/2021 with focal activity in the mental vertex of the mandible which could be inflammatory/traumatic/metastatic. - CT chest on 10/03/2021 showed several lung nodules scattered bilaterally some of which could be metastatic and many others could be due to mucoid impaction. - NGS testing did not show any targetable mutations.  TMB was low.  CCND3 amplified.  PBRM1 VUS present. - Right nephrectomy bed biopsy (10/22/2021): Clear-cell renal cell carcinoma with necrosis - 4 cycles of ipilimumab and nivolumab from 11/19/2021 through 01/24/2022 - CT CAP  on 02/14/2022: Numerous new and enlarged lung nodules.  Significant interval increase in the mass in the right renal bed.  New hypodense liver lesions. - Lenvatinib (12 mg) and everolimus 5 mg daily started on 03/13/2022, discontinued on 07/22/2023 due to progression. - Belzutifan 120 mg daily started on 07/29/2023, discontinued due to progression. - Cabozantinib 40 mg daily started on 10/20/2023.   2. Social/family history: - She lives at home with her husband and also takes care of her mother. - She works as a Child psychotherapist at [employer].  She quit smoking in 2004 and smoked half pack per day for 10 years. - Half sister (same mother) had kidney cancer at age 40.  Maternal grandfather had kidney cancer.  Sister died of lung cancer.  2 half sisters (same father) had breast cancers.  3.  Left breast stage I tubular adenocarcinoma: - She underwent left mastectomy on 06/11/2002.  0/6 lymph nodes positive.  ER 90%, PR 92%, Ki-67 6%, HER2 negative.  As per chart, declined antiestrogen therapy.    Plan: 1. Metastatic clear-cell RCC, intermediate risk by IMDC: - She is tolerating cabozantinib 40 mg daily very well. - I reviewed labs today: Alk phos improved to 199.  AST minimally elevated at 54 and stable.  Creatinine normal.  CBC grossly normal with hemoglobin dropped down to 8.2 from 9.24 weeks ago. - She reportedly lost her insurance.  She has 4 more days of cabozantinib tablets left.  We will try to enroll her in patient assistant program and get immediate supply of cabozantinib.  RTC 4 weeks for follow-up.  Will plan on repeating CT CAP to evaluate response.  Clinically she had great improvement.  2.  Normocytic anemia: - Hemoglobin down to 8.2 from 9.2.  Prior to start of cabozantinib hemoglobin was 12.  This is from myelosuppression.  3.  Hypothyroidism: - Continue Synthroid 125 mcg daily.  TSH today improved to 29 from 49.   4.  Hypertension: - Continue olmesartan/HCTZ.  Blood  pressure today is 148/78.   5.  Right loin pain: - She is not requiring hydrocodone as she does not have any pain.   6.  Fluid retention: - Continue Lasix 20 mg daily as needed.  She is not requiring every day.  Weight is stable at 147.   7.  Nutrition: - Continue Ensure high-protein twice daily.  Weight is stable.    Orders Placed This Encounter  Procedures   CT CHEST ABDOMEN PELVIS W CONTRAST    Standing Status:   Future    Expected Date:   01/29/2024    Expiration Date:   12/28/2024    If indicated for the ordered procedure, I authorize the administration of contrast media per Radiology protocol:   Yes    Does the patient have a contrast media/X-ray dye allergy?:   No    Preferred imaging location?:   [facility]    If indicated for the ordered procedure, I authorize the administration of oral contrast media per Radiology protocol:   Yes   CBC with Differential    Standing Status:   Future    Expected Date:   01/22/2024    Expiration Date:   12/28/2024   Comprehensive metabolic panel    Standing Status:   Future    Expected Date:   01/22/2024    Expiration Date:   12/28/2024   Magnesium    Standing Status:   Future    Expected Date:   01/22/2024    Expiration Date:   12/28/2024      I,Katie Daubenspeck,acting as a scribe for Doreatha Massed, MD.,have documented all relevant documentation on the behalf of Doreatha Massed, MD,as directed by  Doreatha Massed, MD while in the presence of Doreatha Massed, MD.   I, Doreatha Massed MD, have reviewed the above documentation for accuracy and completeness, and I agree with the above.   Doreatha Massed, MD   3/24/202512:26 PM  CHIEF COMPLAINT:   Diagnosis: metastatic clear-cell renal cell carcinoma    Cancer Staging  Malignant neoplasm of female breast [facility]) Staging form: Breast, AJCC 6th Edition - Clinical: Stage I (T1b, N0, M0) - Signed by Ellouise Newer, PA on 08/16/2011  Renal cell cancer  [facility]) Staging form: Kidney, AJCC 8th Edition - Clinical stage from 09/26/2021: Stage IV (cT3c, cNX, cM1) - Unsigned    Prior Therapy: 1. Right nephrectomy, 02/06/21 2. Nivolumab plus ipilimumab, 4 cycles, 11/19/21 - 01/24/22 3. lenvatinib and everolimus, 03/13/22 - 07/22/23 4. Belzutifan, 07/29/23 - 10/2023  Current Therapy:  Cabozantinib    HISTORY OF PRESENT ILLNESS:   Oncology History  Renal cell cancer (HCC)  06/20/2021 Initial Diagnosis   Renal cell carcinoma of right kidney (HCC)   11/19/2021 - 01/24/2022 Chemotherapy   Patient is on Treatment Plan : RENAL CELL CARCINOMA Nivolumab + Ipilimumab q21d / Nivolumab q28d      Genetic Testing   Negative genetic testing. No pathogenic variants identified on the [facility] Multi-Cancer Panel + RNA. VUS in PMS2 called c.1732C>A and in RECQL4 called c.2967G>A  Identified. The report date is 11/01/2021.   The Multi-Cancer Panel + RNA offered by [facility] includes sequencing and/or deletion duplication testing of  the following 84 genes: AIP, ALK, APC, ATM, AXIN2,BAP1,  BARD1, BLM, BMPR1A, BRCA1, BRCA2, BRIP1, CASR, CDC73, CDH1, CDK4, CDKN1B, CDKN1C, CDKN2A (p14ARF), CDKN2A (p16INK4a), CEBPA, CHEK2, CTNNA1, DICER1, DIS3L2, EGFR (c.2369C>T, p.Thr790Met variant only), EPCAM (Deletion/duplication testing only), FH, FLCN, GATA2, GPC3, GREM1 (Promoter region deletion/duplication testing only), HOXB13 (c.251G>A, p.Gly84Glu), HRAS, KIT, MAX, MEN1, MET, MITF (c.952G>A, p.Glu318Lys variant only), MLH1, MSH2, MSH3, MSH6, MUTYH, NBN, NF1, NF2, NTHL1, PALB2, PDGFRA, PHOX2B, PMS2, POLD1, POLE, POT1, PRKAR1A, PTCH1, PTEN, RAD50, RAD51C, RAD51D, RB1, RECQL4, RET, RUNX1, SDHAF2, SDHA (sequence changes only), SDHB, SDHC, SDHD, SMAD4, SMARCA4, SMARCB1, SMARCE1, STK11, SUFU, TERC, TERT, TMEM127, TP53, TSC1, TSC2, VHL, WRN and WT1.       INTERVAL HISTORY:   Deanna Bush is a 63 y.o. female presenting to clinic today for follow up of metastatic clear-cell renal cell carcinoma. She was  last seen by me on 12/01/23.  Today, she states that she is doing well overall. Her appetite level is at 100%. Her energy level is at 60%.  PAST MEDICAL HISTORY:   Past Medical History: Past Medical History:  Diagnosis Date   Adenocarcinoma of breast (HCC)    left    Cellulitis of leg, left    Complication of anesthesia    Hard to wake up   Depression    Diabetes mellitus without complication (HCC)    Pt denies   Family history of breast cancer 08/16/2011   Family history of breast cancer    Family history of colon cancer    Family history of kidney cancer    Family history of prostate cancer    Hyperlipidemia    Hypertension    Hypothyroidism    MRSA (methicillin resistant staph aureus) culture positive 08/19/2011   Pre-diabetes     Surgical History: Past Surgical History:  Procedure Laterality Date   BREAST SURGERY Left    mastectomy   CATARACT EXTRACTION W/PHACO Right 06/06/2023   Procedure: CATARACT EXTRACTION PHACO AND INTRAOCULAR LENS PLACEMENT (IOC);  Surgeon: Fabio Pierce, MD;  Location: [facility];  Service: Ophthalmology;  Laterality: Right;  CDE 5.61   CATARACT EXTRACTION W/PHACO Left 07/21/2023   Procedure: CATARACT EXTRACTION PHACO AND INTRAOCULAR LENS PLACEMENT (IOC);  Surgeon: Fabio Pierce, MD;  Location: [facility];  Service: Ophthalmology;  Laterality: Left;  CDE: 5.96   CESAREAN SECTION     x2   COLONOSCOPY N/A 03/03/2020   Procedure: COLONOSCOPY;  Surgeon: Corbin Ade, MD;  Location: [facility];  Service: Endoscopy;  Laterality: N/A;  12:00   IR IMAGING GUIDED PORT INSERTION  11/07/2021   left mastectomy     NEPHRECTOMY Right 02/06/2021   Procedure: NEPHRECTOMY- open radical;  Surgeon: Malen Gauze, MD;  Location: [facility];  Service: Urology;  Laterality: Right;   POLYPECTOMY  03/03/2020   Procedure: POLYPECTOMY;  Surgeon: Corbin Ade, MD;  Location: [facility];  Service: Endoscopy;;    Social History: Social History   Socioeconomic  History   Marital status: Married    Spouse name: Not on file   Number of children: 2   Years of education: Not on file   Highest education level: GED or equivalent  Occupational History   Occupation: CNA  Tobacco Use   Smoking status: Former    Current packs/day: 0.00    Average packs/day: 0.5 packs/day for 18.0 years (9.0 ttl pk-yrs)    Types: Cigarettes    Start date: 07/01/1984    Quit date: 07/01/2002    Years since quitting: 21.5  Smokeless tobacco: Never  Vaping Use   Vaping status: Never Used  Substance and Sexual Activity   Alcohol use: No   Drug use: No   Sexual activity: Yes    Birth control/protection: None  Other Topics Concern   Not on file  Social History Narrative   Not on file   Social Drivers of Health   Financial Resource Strain: Medium Risk (08/14/2023)   Overall Financial Resource Strain (CARDIA)    Difficulty of Paying Living Expenses: Somewhat hard  Food Insecurity: No Food Insecurity (12/08/2023)   Hunger Vital Sign    Worried About Running Out of Food in the Last Year: Never true    Ran Out of Food in the Last Year: Never true  Transportation Needs: No Transportation Needs (12/08/2023)   PRAPARE - Administrator, Civil Service (Medical): No    Lack of Transportation (Non-Medical): No  Physical Activity: Insufficiently Active (12/08/2023)   Exercise Vital Sign    Days of Exercise per Week: 3 days    Minutes of Exercise per Session: 20 min  Stress: No Stress Concern Present (12/08/2023)   [employer] of Occupational Health - Occupational Stress Questionnaire    Feeling of Stress : Only a little  Social Connections: Moderately Integrated (12/08/2023)   Social Connection and Isolation Panel [NHANES]    Frequency of Communication with Friends and Family: More than three times a week    Frequency of Social Gatherings with Friends and Family: More than three times a week    Attends Religious Services: More than 4 times per year    Active  Member of [employer] or Organizations: Yes    Attends Engineer, structural: More than 4 times per year    Marital Status: Separated  Intimate Partner Violence: Not on file    Family History: Family History  Problem Relation Age of Onset   Alcohol abuse Mother    Hypertension Mother    Diabetes Mother    Hyperlipidemia Mother    Colon cancer Maternal Aunt        dx 20s   Prostate cancer Maternal Uncle    Throat cancer Maternal Uncle    Kidney cancer Maternal Grandfather    Lung cancer Half-Sister    Kidney cancer Half-Sister 62   Breast cancer Half-Sister 28   Breast cancer Half-Sister 61    Current Medications:  Current Outpatient Medications:    acetaminophen (TYLENOL) 500 MG tablet, Take 500 mg by mouth every 6 (six) hours as needed for moderate pain or headache., Disp: , Rfl:    atorvastatin (LIPITOR) 40 MG tablet, Take 1 tablet (40 mg total) by mouth daily., Disp: 90 tablet, Rfl: 1   CABOMETYX 40 MG tablet, TAKE 1 TABLET BY MOUTH DAILY ON  AN EMPTY STOMACH 1 HOUR BEFORE  OR 2 HOURS AFTER A MEAL, Disp: 30 tablet, Rfl: 1   EPINEPHrine 0.3 mg/0.3 mL IJ SOAJ injection, Inject 0.3 mg into the muscle as needed for anaphylaxis., Disp: 2 each, Rfl: 0   furosemide (LASIX) 20 MG tablet, TAKE 1 TABLET(20 MG) BY MOUTH DAILY AS NEEDED, Disp: 90 tablet, Rfl: 1   levothyroxine (SYNTHROID) 125 MCG tablet, Take 1 tablet (125 mcg total) by mouth daily before breakfast., Disp: 90 tablet, Rfl: 1   magnesium oxide (MAG-OX) 400 (240 Mg) MG tablet, Take 1 tablet (400 mg total) by mouth daily., Disp: 30 tablet, Rfl: 4   Multiple Vitamin (MULTIVITAMIN WITH MINERALS) TABS tablet, Take 1 tablet by  mouth daily., Disp: , Rfl:    olmesartan-hydrochlorothiazide (BENICAR HCT) 40-25 MG tablet, TAKE 1 TABLET BY MOUTH DAILY, Disp: 30 tablet, Rfl: 1   HYDROcodone-acetaminophen (NORCO) 5-325 MG tablet, Take 1 tablet by mouth every 6 (six) hours as needed for moderate pain (pain score 4-6). (Patient not  taking: Reported on 12/29/2023), Disp: 30 tablet, Rfl: 0   UNABLE TO FIND, 6 mastectomy prosthesis and bras., Disp: 6 each, Rfl: 0   urea (CARMOL) 10 % cream, Apply topically 2 (two) times daily. Apply twice daily to hands, Disp: 71 g, Rfl: 0 No current facility-administered medications for this visit.  Facility-Administered Medications Ordered in Other Visits:    heparin lock flush 100 unit/mL, 500 Units, Intravenous, Once, Doreatha Massed, MD   sodium chloride flush (NS) 0.9 % injection 10 mL, 10 mL, Intravenous, PRN, Doreatha Massed, MD, 10 mL at 12/29/23 1034   Allergies: Allergies  Allergen Reactions   Amlodipine Swelling    Leg edema   Sulfonamide Derivatives Itching   Yellow Jacket Venom [Bee Venom] Rash    urticaria    REVIEW OF SYSTEMS:   Review of Systems  Constitutional:  Negative for chills, fatigue and fever.  HENT:   Negative for lump/mass, mouth sores, nosebleeds, sore throat and trouble swallowing.   Eyes:  Negative for eye problems.  Respiratory:  Negative for cough and shortness of breath.   Cardiovascular:  Negative for chest pain, leg swelling and palpitations.  Gastrointestinal:  Negative for abdominal pain, constipation, diarrhea, nausea and vomiting.  Genitourinary:  Negative for bladder incontinence, difficulty urinating, dysuria, frequency, hematuria and nocturia.   Musculoskeletal:  Negative for arthralgias, back pain, flank pain, myalgias and neck pain.  Skin:  Negative for itching and rash.  Neurological:  Negative for dizziness, headaches and numbness.  Hematological:  Does not bruise/bleed easily.  Psychiatric/Behavioral:  Negative for depression, sleep disturbance and suicidal ideas. The patient is not nervous/anxious.   All other systems reviewed and are negative.    VITALS:   There were no vitals taken for this visit.  Wt Readings from Last 3 Encounters:  12/29/23 147 lb 0.8 oz (66.7 kg)  12/12/23 148 lb (67.1 kg)  12/02/23 148 lb  12.8 oz (67.5 kg)    There is no height or weight on file to calculate BMI.  Performance status (ECOG): 1 - Symptomatic but completely ambulatory  PHYSICAL EXAM:   Physical Exam Vitals and nursing note reviewed. Exam conducted with a chaperone present.  Constitutional:      Appearance: Normal appearance.  Cardiovascular:     Rate and Rhythm: Normal rate and regular rhythm.     Pulses: Normal pulses.     Heart sounds: Normal heart sounds.  Pulmonary:     Effort: Pulmonary effort is normal.     Breath sounds: Normal breath sounds.  Abdominal:     Palpations: Abdomen is soft. There is no hepatomegaly, splenomegaly or mass.     Tenderness: There is no abdominal tenderness.  Musculoskeletal:     Right lower leg: No edema.     Left lower leg: No edema.  Lymphadenopathy:     Cervical: No cervical adenopathy.     Right cervical: No superficial, deep or posterior cervical adenopathy.    Left cervical: No superficial, deep or posterior cervical adenopathy.     Upper Body:     Right upper body: No supraclavicular or axillary adenopathy.     Left upper body: No supraclavicular or axillary adenopathy.  Neurological:  General: No focal deficit present.     Mental Status: She is alert and oriented to person, place, and time.  Psychiatric:        Mood and Affect: Mood normal.        Behavior: Behavior normal.     LABS:   CBC     Component Value Date/Time   WBC 5.9 12/29/2023 1031   RBC 2.66 (L) 12/29/2023 1031   HGB 8.2 (L) 12/29/2023 1031   HGB 11.1 07/10/2021 1530   HCT 26.3 (L) 12/29/2023 1031   HCT 33.7 (L) 07/10/2021 1530   PLT 305 12/29/2023 1031   PLT 374 07/10/2021 1530   MCV 98.9 12/29/2023 1031   MCV 89 07/10/2021 1530   MCH 30.8 12/29/2023 1031   MCHC 31.2 12/29/2023 1031   RDW 18.1 (H) 12/29/2023 1031   RDW 13.0 07/10/2021 1530   LYMPHSABS 1.9 12/29/2023 1031   LYMPHSABS 2.5 07/10/2021 1530   MONOABS 0.5 12/29/2023 1031   EOSABS 0.1 12/29/2023 1031    EOSABS 0.0 07/10/2021 1530   BASOSABS 0.0 12/29/2023 1031   BASOSABS 0.0 07/10/2021 1530    CMP      Component Value Date/Time   NA 132 (L) 12/29/2023 1031   NA 139 07/21/2023 0905   K 4.2 12/29/2023 1031   CL 95 (L) 12/29/2023 1031   CO2 23 12/29/2023 1031   GLUCOSE 92 12/29/2023 1031   BUN 22 12/29/2023 1031   BUN 18 07/21/2023 0905   CREATININE 0.87 12/29/2023 1031   CREATININE 0.73 04/22/2019 0848   CALCIUM 9.6 12/29/2023 1031   PROT 7.2 12/29/2023 1031   PROT 6.5 07/21/2023 0905   ALBUMIN 3.2 (L) 12/29/2023 1031   ALBUMIN 3.8 (L) 07/21/2023 0905   AST 54 (H) 12/29/2023 1031   ALT 29 12/29/2023 1031   ALKPHOS 199 (H) 12/29/2023 1031   BILITOT 0.5 12/29/2023 1031   BILITOT 0.4 07/21/2023 0905   GFRNONAA >60 12/29/2023 1031   GFRNONAA 90 04/22/2019 0848   GFRAA 96 10/30/2020 0914   GFRAA 104 04/22/2019 0848     No results found for: "CEA1", "CEA" / No results found for: "CEA1", "CEA" No results found for: "PSA1" No results found for: "CAN199" No results found for: "CAN125"  No results found for: "TOTALPROTELP", "ALBUMINELP", "A1GS", "A2GS", "BETS", "BETA2SER", "GAMS", "MSPIKE", "SPEI" Lab Results  Component Value Date   TIBC 284 08/26/2023   TIBC 308 07/15/2023   TIBC 307 04/29/2023   FERRITIN 272 08/26/2023   FERRITIN 180 07/15/2023   FERRITIN 152 04/29/2023   IRONPCTSAT 17 08/26/2023   IRONPCTSAT 21 07/15/2023   IRONPCTSAT 20 04/29/2023   Lab Results  Component Value Date   LDH 178 07/15/2023   LDH 178 02/21/2022   LDH 152 09/27/2021     STUDIES:   No results found.

## 2023-12-29 NOTE — Progress Notes (Signed)
 Patients port flushed without difficulty.  Good blood return noted with no bruising or swelling noted at site.  Band aid applied.  VSS with discharge and left in satisfactory condition with no s/s of distress noted.

## 2023-12-29 NOTE — Progress Notes (Signed)
 Patient is taking Cabometyx as prescribed.  She has not missed any doses and reports no side effects at this time.

## 2023-12-29 NOTE — Telephone Encounter (Signed)
 Oral Oncology Patient Advocate Encounter   Began application for assistance for Cabometyx through [employer].   Application will be submitted upon completion of necessary supporting documentation.   [employer] phone number [phone].   I will continue to check the status until final determination.   [name] Almedia Balls, CPhT Oncology Pharmacy Patient Advocate [facility] Direct Number: [phone] Fax: [phone]

## 2023-12-31 ENCOUNTER — Inpatient Hospital Stay: Payer: Self-pay | Admitting: Licensed Clinical Social Worker

## 2023-12-31 DIAGNOSIS — C641 Malignant neoplasm of right kidney, except renal pelvis: Secondary | ICD-10-CM

## 2023-12-31 NOTE — Progress Notes (Signed)
 [facility] CSW Progress Note  Clinical Child psychotherapist contacted patient by phone to discuss insurance concerns.  Pt reports she was sent a letter from her employer that she will need to pay $3,000 to continue her insurance w/ COBRA.  Pt is unable to pay that amount at this time.  Pt reports that she is currently going through a divorce, but has asked her husband if he may be able to put her on his health insurance until she is able to transition to Medicare at 63.  She has not yet heard back from her husband.  Pt will apply for Medicaid if her husband is unable to add her to his insurance.  CSW provided pt w/ contact information for any additional questions she may have.      Rachel Moulds, LCSW Clinical Social Worker [facility]

## 2024-01-01 NOTE — Telephone Encounter (Signed)
 Oral Oncology Patient Advocate Encounter  Application has been submitted via e fax to [employer] phone number [phone]. Fax number: [phone]   I will continue to check the status until final determination.   [name] Almedia Balls, CPhT Oncology Pharmacy Patient Advocate [facility] Direct Number: [phone] Fax: [phone]

## 2024-01-05 NOTE — Telephone Encounter (Signed)
 Oral Oncology Patient Advocate Encounter   Received notification that the application for assistance for Cabometyx through [employer] has been approved.   EASE phone number [phone].   Effective dates: 01/02/2024 through 10/06/2024  Medication will be filled at [facility]. (Phone Number: [phone])  I have spoken to the patient.   [name] Almedia Balls, CPhT Oncology Pharmacy Patient Advocate [facility] Direct Number: [phone] Fax: [phone]

## 2024-01-06 ENCOUNTER — Other Ambulatory Visit: Payer: Self-pay | Admitting: Pharmacist

## 2024-01-06 DIAGNOSIS — C641 Malignant neoplasm of right kidney, except renal pelvis: Secondary | ICD-10-CM

## 2024-01-06 MED ORDER — CABOMETYX 40 MG PO TABS: 40.0000 mg | ORAL_TABLET | Freq: Every day | ORAL | 2 refills | Status: DC

## 2024-01-06 NOTE — Progress Notes (Signed)
 Patient lost insurance and is switching to PAP. Rx directed to PAP Dispensing pharmacy.

## 2024-01-12 ENCOUNTER — Ambulatory Visit (INDEPENDENT_AMBULATORY_CARE_PROVIDER_SITE_OTHER): Payer: 59 | Admitting: Psychiatry

## 2024-01-12 DIAGNOSIS — F4323 Adjustment disorder with mixed anxiety and depressed mood: Secondary | ICD-10-CM

## 2024-01-12 NOTE — Progress Notes (Signed)
 IN-PERSON  THERAPIST PROGRESS NOTE  Session Time:  Monday  01/12/2024 9:14 AM -  9:55 AM    Participation Level: Active  Behavioral Response: CasualAlert/Euthymic   Type of Therapy: Individual Therapy  Treatment Goals addressed: Have healthy grieving process around loss Begin verbalizing feelings associated with loss     ProgressTowards Goals: Progressing  Interventions: Supportive/CBT  Summary: Deanna Bush is a 63 y.o. female who p is referred for services by PCP Dr. Lodema Hong due to pt experiencing symptoms of anxiety along with grief and loss issues. Pt denies any psychiatric hospitalizations. Pt has no previous involvement in outpatient therapy.  Per patient's report her stepdaughter was diagnosed with colon cancer at age 87 and died in 2022-10-24.  Patient was her caretaker and says patient died at her home.  Patient reports her husband left her about 2 weeks ago for 28 year old after 30 years of marriage.  Patient reports additional stress regarding providing care for her 79 year old mother.  Patient also has her own health issues as she was diagnosed with kidney cancer 2 years ago and had chemo.  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 She currently is taking experimental drugs.  Patient reports crying spells, sadness, irritability, worry, anxiety, and sleeping difficulty.   Patient last was seen about 1 and half months ago via virtual visit.  Per patient's report, her treatments are going well and she is feeling much better.  She is very pleased she is not experiencing any pain.  She reports her stamina still has not returned to where it was but she paces self and participates in various activities.  She has resumed attendance at church and finds this very helpful.  She continues to perform light household tasks.  She also has resumed driving.  She and her sons still provide care for patient's 28 year old mother.  Patient expresses frustration with most  of her siblings as they have not helped out with this.  She reports strong support from one of her sisters.  Patient also reports stress regarding financial issues as she just recently started receiving Social Security.  She has applied for disability and is waiting for a decision.  She reports stress recently due to not having any insurance but contacted her ex-husband who agreed reluctantly to place her on his insurance for the time being.  She reports increased acceptance of the dissolution of her marriage and her husband's life with a new family.  She reports she is still adjusting to this as well as the sudden loss of her job due to her illness.  Suicidal/Homicidal: Nowithout intent/plan  Therapist Response: Reviewed symptoms, praised and reinforced pt's involvement in activity, discuss stressors, facilitated expression of thoughts and feelings, validated feelings, facilitated patient expressing thoughts and feelings regarding sudden retirement due to her illness, validated and normalized feelings, assisted patient identified changes and effects, began to discuss next steps for treatment  plan: Return again in 2 weeks.  Diagnosis: Adjustment disorder with mixed anxiety and depressed mood      Collaboration of Care: Primary Care Provider AEB patient is seeing PCP Dr. [name] for medication management  Patient/Guardian was advised Release of Information must be obtained prior to any record release in order to collaborate their care with an outside provider. Patient/Guardian was advised if they have not already done so to contact the registration department to sign all necessary forms in order for [place] to release information regarding their care.  Consent: Patient/Guardian gives verbal consent for treatment and assignment of benefits for services provided during this visit. Patient/Guardian expressed understanding and agreed to proceed.   Adah Salvage, LCSW4/04/2024

## 2024-01-13 ENCOUNTER — Other Ambulatory Visit: Payer: Self-pay | Admitting: Hematology

## 2024-01-22 ENCOUNTER — Inpatient Hospital Stay: Payer: Self-pay | Attending: Hematology | Admitting: Licensed Clinical Social Worker

## 2024-01-22 DIAGNOSIS — J90 Pleural effusion, not elsewhere classified: Secondary | ICD-10-CM | POA: Insufficient documentation

## 2024-01-22 DIAGNOSIS — C786 Secondary malignant neoplasm of retroperitoneum and peritoneum: Secondary | ICD-10-CM | POA: Insufficient documentation

## 2024-01-22 DIAGNOSIS — C7800 Secondary malignant neoplasm of unspecified lung: Secondary | ICD-10-CM | POA: Insufficient documentation

## 2024-01-22 DIAGNOSIS — R590 Localized enlarged lymph nodes: Secondary | ICD-10-CM | POA: Insufficient documentation

## 2024-01-22 DIAGNOSIS — C787 Secondary malignant neoplasm of liver and intrahepatic bile duct: Secondary | ICD-10-CM | POA: Insufficient documentation

## 2024-01-22 DIAGNOSIS — C641 Malignant neoplasm of right kidney, except renal pelvis: Secondary | ICD-10-CM

## 2024-01-22 DIAGNOSIS — Z905 Acquired absence of kidney: Secondary | ICD-10-CM | POA: Insufficient documentation

## 2024-01-23 NOTE — Progress Notes (Signed)
 [facility] CSW Progress Note  Visual merchandiser  spoke w/ pt's regarding insurance concerns.  Pt is currently w/o insurance.  Previously pt was going to try to see if her husband, from whom she has recently separated, could add her to his policy.  Pt can not be added to her husband's policy at this time.  Pt has a CT scan scheduled on 4/28.  CSW instructed pt to ask to be billed for the scan.  In the interim CSW instructed pt to go to DSS to apply for Medicaid.  If approved the Medicaid would be retroactive for the scan.  If not approved for Medicaid pt will need to apply for [employer] insurance.  CSW to remain available as appropriate throughout duration of treatment to provide support.        Quenton Bruns, LCSW Clinical Social Worker [facility]

## 2024-01-26 ENCOUNTER — Ambulatory Visit (HOSPITAL_COMMUNITY): Payer: 59 | Admitting: Psychiatry

## 2024-01-30 ENCOUNTER — Inpatient Hospital Stay: Payer: Self-pay | Admitting: Licensed Clinical Social Worker

## 2024-01-30 DIAGNOSIS — C641 Malignant neoplasm of right kidney, except renal pelvis: Secondary | ICD-10-CM

## 2024-01-30 NOTE — Progress Notes (Signed)
 [facility] CSW Progress Note  Visual merchandiser  spoke w/ pt by phone.  Pt reports she was informed by DSS that she has been approved for Medicaid, but it will take 45 days to go into effect.  Pt instructed to inform her providers as services can be retroactively billed to Medicaid.  Pt is scheduled for a scan on 4/28.  Pt states she will inform them that the Medicaid has been approved and plans to move forward w/ the scan.  CSW to remain available as appropriate throughout duration of treatment.        Quenton Bruns, LCSW Clinical Social Worker [facility]

## 2024-02-02 ENCOUNTER — Inpatient Hospital Stay: Payer: Self-pay

## 2024-02-02 ENCOUNTER — Ambulatory Visit (HOSPITAL_COMMUNITY)
Admission: RE | Admit: 2024-02-02 | Discharge: 2024-02-02 | Disposition: A | Discharge status: Home / Self Care | Payer: Self-pay | Source: Ambulatory Visit | Attending: Hematology | Admitting: Hematology

## 2024-02-02 ENCOUNTER — Encounter (HOSPITAL_COMMUNITY): Payer: Self-pay | Admitting: Radiology

## 2024-02-02 DIAGNOSIS — J90 Pleural effusion, not elsewhere classified: Secondary | ICD-10-CM | POA: Diagnosis not present

## 2024-02-02 DIAGNOSIS — C50912 Malignant neoplasm of unspecified site of left female breast: Secondary | ICD-10-CM

## 2024-02-02 DIAGNOSIS — Z905 Acquired absence of kidney: Secondary | ICD-10-CM | POA: Diagnosis not present

## 2024-02-02 DIAGNOSIS — C641 Malignant neoplasm of right kidney, except renal pelvis: Secondary | ICD-10-CM | POA: Insufficient documentation

## 2024-02-02 DIAGNOSIS — C786 Secondary malignant neoplasm of retroperitoneum and peritoneum: Secondary | ICD-10-CM | POA: Diagnosis not present

## 2024-02-02 DIAGNOSIS — C787 Secondary malignant neoplasm of liver and intrahepatic bile duct: Secondary | ICD-10-CM | POA: Diagnosis not present

## 2024-02-02 DIAGNOSIS — R590 Localized enlarged lymph nodes: Secondary | ICD-10-CM | POA: Diagnosis not present

## 2024-02-02 DIAGNOSIS — C7800 Secondary malignant neoplasm of unspecified lung: Secondary | ICD-10-CM | POA: Diagnosis not present

## 2024-02-02 DIAGNOSIS — E039 Hypothyroidism, unspecified: Secondary | ICD-10-CM

## 2024-02-02 LAB — CBC WITH DIFFERENTIAL/PLATELET
Abs Immature Granulocytes: 0.05 10*3/uL (ref 0.00–0.07)
Basophils Absolute: 0 10*3/uL (ref 0.0–0.1)
Basophils Relative: 0 %
Eosinophils Absolute: 0.1 10*3/uL (ref 0.0–0.5)
Eosinophils Relative: 1 %
HCT: 26 % — ABNORMAL LOW (ref 36.0–46.0)
Hemoglobin: 8.2 g/dL — ABNORMAL LOW (ref 12.0–15.0)
Immature Granulocytes: 1 %
Lymphocytes Relative: 23 %
Lymphs Abs: 1.6 10*3/uL (ref 0.7–4.0)
MCH: 32 pg (ref 26.0–34.0)
MCHC: 31.5 g/dL (ref 30.0–36.0)
MCV: 101.6 fL — ABNORMAL HIGH (ref 80.0–100.0)
Monocytes Absolute: 0.6 10*3/uL (ref 0.1–1.0)
Monocytes Relative: 8 %
Neutro Abs: 4.7 10*3/uL (ref 1.7–7.7)
Neutrophils Relative %: 67 %
Platelets: 328 10*3/uL (ref 150–400)
RBC: 2.56 MIL/uL — ABNORMAL LOW (ref 3.87–5.11)
RDW: 14.9 % (ref 11.5–15.5)
WBC: 6.9 10*3/uL (ref 4.0–10.5)
nRBC: 0 % (ref 0.0–0.2)

## 2024-02-02 LAB — COMPREHENSIVE METABOLIC PANEL WITH GFR
ALT: 31 U/L (ref 0–44)
AST: 49 U/L — ABNORMAL HIGH (ref 15–41)
Albumin: 2.9 g/dL — ABNORMAL LOW (ref 3.5–5.0)
Alkaline Phosphatase: 253 U/L — ABNORMAL HIGH (ref 38–126)
Anion gap: 12 (ref 5–15)
BUN: 20 mg/dL (ref 8–23)
CO2: 25 mmol/L (ref 22–32)
Calcium: 9.3 mg/dL (ref 8.9–10.3)
Chloride: 98 mmol/L (ref 98–111)
Creatinine, Ser: 0.76 mg/dL (ref 0.44–1.00)
GFR, Estimated: >60 mL/min (ref >60)
Glucose, Bld: 84 mg/dL (ref 70–99)
Potassium: 4 mmol/L (ref 3.5–5.1)
Sodium: 135 mmol/L (ref 135–145)
Total Bilirubin: 0.5 mg/dL (ref 0.0–1.2)
Total Protein: 7.2 g/dL (ref 6.5–8.1)

## 2024-02-02 LAB — POCT I-STAT CREATININE: Creatinine, Ser: 0.6 mg/dL (ref 0.44–1.00)

## 2024-02-02 LAB — MAGNESIUM: Magnesium: 1.7 mg/dL (ref 1.7–2.4)

## 2024-02-02 LAB — SAMPLE TO BLOOD BANK

## 2024-02-02 MED ORDER — IOHEXOL 300 MG/ML  SOLN
100.0000 mL | Freq: Once | INTRAMUSCULAR | Status: AC | PRN
  Administered 2024-02-02: 100 mL via INTRAVENOUS

## 2024-02-02 MED ORDER — HEPARIN SOD (PORK) LOCK FLUSH 100 UNIT/ML IV SOLN
500.0000 [IU] | Freq: Once | INTRAVENOUS | Status: AC
  Administered 2024-02-02: 500 [IU] via INTRAVENOUS

## 2024-02-02 MED ORDER — SODIUM CHLORIDE 0.9% FLUSH
10.0000 mL | Freq: Once | INTRAVENOUS | Status: AC
  Administered 2024-02-02: 10 mL via INTRAVENOUS

## 2024-02-02 NOTE — Patient Instructions (Signed)
 [facility] [place] - A DEPT OF [facility]  Discharge Instructions: Thank you for choosing [facility] to provide your oncology and hematology care.  If you have a lab appointment with the Cancer Center - please note that after April 8th, 2024, all labs will be drawn in the cancer center.  You do not have to check in or register with the main entrance as you have in the past but will complete your check-in in the cancer center.  Wear comfortable clothing and clothing appropriate for easy access to any Portacath or PICC line.   We strive to give you quality time with your provider. You may need to reschedule your appointment if you arrive late (15 or more minutes).  Arriving late affects you and other patients whose appointments are after yours.  Also, if you miss three or more appointments without notifying the office, you may be dismissed from the clinic at the provider's discretion.      For prescription refill requests, have your pharmacy contact our office and allow 72 hours for refills to be completed.    Today you received the following port flush with labs.   To help prevent nausea and vomiting after your treatment, we encourage you to take your nausea medication as directed.  BELOW ARE SYMPTOMS THAT SHOULD BE REPORTED IMMEDIATELY: *FEVER GREATER THAN 100.4 F (38 C) OR HIGHER *CHILLS OR SWEATING *NAUSEA AND VOMITING THAT IS NOT CONTROLLED WITH YOUR NAUSEA MEDICATION *UNUSUAL SHORTNESS OF BREATH *UNUSUAL BRUISING OR BLEEDING *URINARY PROBLEMS (pain or burning when urinating, or frequent urination) *BOWEL PROBLEMS (unusual diarrhea, constipation, pain near the anus) TENDERNESS IN MOUTH AND THROAT WITH OR WITHOUT PRESENCE OF ULCERS (sore throat, sores in mouth, or a toothache) UNUSUAL RASH, SWELLING OR PAIN  UNUSUAL VAGINAL DISCHARGE OR ITCHING   Items with * indicate a potential emergency and should be followed up as soon as possible or go to  the Emergency Department if any problems should occur.  Please show the CHEMOTHERAPY ALERT CARD or IMMUNOTHERAPY ALERT CARD at check-in to the Emergency Department and triage nurse.  Should you have questions after your visit or need to cancel or reschedule your appointment, please contact [facility] [place] - A DEPT OF [facility] [phone]  and follow the prompts.  Office hours are 8:00 a.m. to 4:30 p.m. Monday - Friday. Please note that voicemails left after 4:00 p.m. may not be returned until the following business day.  We are closed weekends and major holidays. You have access to a nurse at all times for urgent questions. Please call the main number to the clinic [phone] and follow the prompts.  For any non-urgent questions, you may also contact your provider using MyChart. We now offer e-Visits for anyone 6 and older to request care online for non-urgent symptoms. For details visit [email or web address].   Also download the MyChart app! Go to the app store, search "MyChart", open the app, select [place], and log in with your MyChart username and password.

## 2024-02-02 NOTE — Progress Notes (Signed)
 Geoffry Kill presented for Portacath access and flush. Proper placement of portacath confirmed by CXR. Portacath located right chest wall accessed with  H 20 needle. Good blood return present. Portacath flushed with 20ml NS and sent Pt for her scan today.  Procedure without incident. Patient tolerated procedure well.

## 2024-02-09 ENCOUNTER — Ambulatory Visit (INDEPENDENT_AMBULATORY_CARE_PROVIDER_SITE_OTHER): Payer: 59 | Admitting: Psychiatry

## 2024-02-09 DIAGNOSIS — F4323 Adjustment disorder with mixed anxiety and depressed mood: Secondary | ICD-10-CM

## 2024-02-09 NOTE — Progress Notes (Signed)
  IN-PERSON  THERAPIST PROGRESS NOTE  Session Time:  Monday  02/09/2024 2:10 PM -  2:53 PM   Participation Level: Active  Behavioral Response: CasualAlert/Euthymic   Type of Therapy: Individual Therapy  Treatment Goals addressed: Have healthy grieving process around loss Begin verbalizing feelings associated with loss     ProgressTowards Goals: Progressing  Interventions: Supportive/CBT  Summary: Deanna Bush is a 63 y.o. female who p is referred for services by PCP Dr. Rodolph Clap due to pt experiencing symptoms of anxiety along with grief and loss issues. Pt denies any psychiatric hospitalizations. Pt has no previous involvement in outpatient therapy.  Per patient's report her stepdaughter was diagnosed with colon cancer at age 66 and died in 11-11-22.  Patient was her caretaker and says patient died at her home.  Patient reports her husband left her about 2 weeks ago for 95 year old after 30 years of marriage.  Patient reports additional stress regarding providing care for her 22 year old mother.  Patient also has her own health issues as she was diagnosed with kidney cancer 2 years ago and had chemo.  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 She currently is taking experimental drugs.  Patient reports crying spells, sadness, irritability, worry, anxiety, and sleeping difficulty.   Patient last was seen about 4 weeks ago.  She reports continuing to feel better physically and emotionally.  She is very pleased she still is not experiencing any physical pain.  She enjoyed a recent trip to [place] with her [place].  She reports having moments of depressed mood or days off feeling down as she states sometimes she realizes how sick she really is.  Patient has stage IV lung and liver cancer.  She takes medication daily.  Patient reports trying to focus on living life.  She continues to experience adjustment issues as well as grief and loss related to the loss  of her job and her changed functioning.  She reports she is near the end of the divorce process as she now has to complete paperwork listing assets to begin finalizing the divorce.  She reports recent contact with husband and continues to verbalize feelings of betrayal and hurt.  Suicidal/Homicidal: Nowithout intent/plan  Therapist Response: Reviewed symptoms, praised and reinforced pt's involvement in activity, discuss stressors, facilitated expression of thoughts and feelings, validated feelings, assisted patient identify values and ways to continue to pursue value congruent activities, discussed the role of feelings and being open to her feelings rather than ignoring or suppressing, assisted patient identify positive aspects of changes in her life   Diagnosis: Adjustment disorder with mixed anxiety and depressed mood      Collaboration of Care: Primary Care Provider AEB patient is seeing PCP Dr. Alberteen Huge for medication management  Patient/Guardian was advised Release of Information must be obtained prior to any record release in order to collaborate their care with an outside provider. Patient/Guardian was advised if they have not already done so to contact the registration department to sign all necessary forms in order for us  to release information regarding their care.   Consent: Patient/Guardian gives verbal consent for treatment and assignment of benefits for services provided during this visit. Patient/Guardian expressed understanding and agreed to proceed.   Tyneisha Hegeman E Erron Wengert, LCSW5/02/2024

## 2024-02-10 ENCOUNTER — Inpatient Hospital Stay: Payer: Self-pay | Attending: Hematology | Admitting: Hematology

## 2024-02-10 VITALS — BP 123/66 | HR 83 | Temp 97.9°F | Resp 18 | Ht 66.0 in | Wt 144.5 lb

## 2024-02-10 DIAGNOSIS — J90 Pleural effusion, not elsewhere classified: Secondary | ICD-10-CM | POA: Diagnosis not present

## 2024-02-10 DIAGNOSIS — C641 Malignant neoplasm of right kidney, except renal pelvis: Secondary | ICD-10-CM | POA: Diagnosis not present

## 2024-02-10 DIAGNOSIS — E039 Hypothyroidism, unspecified: Secondary | ICD-10-CM | POA: Insufficient documentation

## 2024-02-10 DIAGNOSIS — Z853 Personal history of malignant neoplasm of breast: Secondary | ICD-10-CM | POA: Insufficient documentation

## 2024-02-10 DIAGNOSIS — C787 Secondary malignant neoplasm of liver and intrahepatic bile duct: Secondary | ICD-10-CM | POA: Diagnosis not present

## 2024-02-10 DIAGNOSIS — D649 Anemia, unspecified: Secondary | ICD-10-CM | POA: Diagnosis not present

## 2024-02-10 DIAGNOSIS — M545 Low back pain, unspecified: Secondary | ICD-10-CM | POA: Diagnosis not present

## 2024-02-10 DIAGNOSIS — Z87891 Personal history of nicotine dependence: Secondary | ICD-10-CM | POA: Diagnosis not present

## 2024-02-10 DIAGNOSIS — Z803 Family history of malignant neoplasm of breast: Secondary | ICD-10-CM | POA: Diagnosis not present

## 2024-02-10 DIAGNOSIS — R918 Other nonspecific abnormal finding of lung field: Secondary | ICD-10-CM | POA: Insufficient documentation

## 2024-02-10 DIAGNOSIS — Z905 Acquired absence of kidney: Secondary | ICD-10-CM | POA: Insufficient documentation

## 2024-02-10 DIAGNOSIS — Z8051 Family history of malignant neoplasm of kidney: Secondary | ICD-10-CM | POA: Diagnosis not present

## 2024-02-10 DIAGNOSIS — Z801 Family history of malignant neoplasm of trachea, bronchus and lung: Secondary | ICD-10-CM | POA: Diagnosis not present

## 2024-02-10 DIAGNOSIS — R609 Edema, unspecified: Secondary | ICD-10-CM | POA: Insufficient documentation

## 2024-02-10 DIAGNOSIS — D509 Iron deficiency anemia, unspecified: Secondary | ICD-10-CM

## 2024-02-10 DIAGNOSIS — C7801 Secondary malignant neoplasm of right lung: Secondary | ICD-10-CM | POA: Diagnosis not present

## 2024-02-10 DIAGNOSIS — Z800 Family history of malignant neoplasm of digestive organs: Secondary | ICD-10-CM | POA: Insufficient documentation

## 2024-02-10 DIAGNOSIS — I10 Essential (primary) hypertension: Secondary | ICD-10-CM | POA: Insufficient documentation

## 2024-02-10 NOTE — Patient Instructions (Addendum)
 [facility] at [facility] Discharge Instructions   You were seen and examined today by Dr. Cheree Cords.  He reviewed the results of your lab work which are normal/stable.   He reviewed the results of your CT scan which is stable.   Continue Cabometyx  as prescribed.   We will see you back in 6 weeks. We will repeat lab work prior to this visit.   Return as scheduled.    Thank you for choosing [facility] at [facility] to provide your oncology and hematology care.  To afford each patient quality time with our provider, please arrive at least 15 minutes before your scheduled appointment time.   If you have a lab appointment with the Cancer Center please come in thru the Main Entrance and check in at the main information desk.  You need to re-schedule your appointment should you arrive 10 or more minutes late.  We strive to give you quality time with our providers, and arriving late affects you and other patients whose appointments are after yours.  Also, if you no show three or more times for appointments you may be dismissed from the clinic at the providers discretion.     Again, thank you for choosing [facility].  Our hope is that these requests will decrease the amount of time that you wait before being seen by our physicians.       _____________________________________________________________  Should you have questions after your visit to [facility], please contact our office at [phone] and follow the prompts.  Our office hours are 8:00 a.m. and 4:30 p.m. Monday - Friday.  Please note that voicemails left after 4:00 p.m. may not be returned until the following business day.  We are closed weekends and major holidays.  You do have access to a nurse 24-7, just call the main number to the clinic [phone] and do not press any options, hold on the line and a nurse will answer the phone.    For prescription  refill requests, have your pharmacy contact our office and allow 72 hours.    Due to Covid, you will need to wear a mask upon entering the hospital. If you do not have a mask, a mask will be given to you at the Main Entrance upon arrival. For doctor visits, patients may have 1 support person age 68 or older with them. For treatment visits, patients can not have anyone with them due to social distancing guidelines and our immunocompromised population.

## 2024-02-10 NOTE — Progress Notes (Signed)
 Patient is taking Cabometyx as prescribed.  She has not missed any doses and reports no side effects at this time.

## 2024-02-10 NOTE — Progress Notes (Signed)
 [facility] [address] [place] [address]    Clinic Day:  02/10/2024  Referring physician: Towanda Fret, MD  Patient Care Team: Towanda Fret, MD as PCP - General Mallipeddi, Kennyth Pean, MD as PCP - Cardiology (Cardiology) Paulett Boros, MD as Medical Oncologist (Medical Oncology)   ASSESSMENT & PLAN:   Assessment: 1. Metastatic clear-cell renal cell carcinoma: - Right radical nephrectomy on 02/06/2021. - Pathology shows clear-cell RCC, grade 4, 12.5 cm, necrosis and rhabdoid features are identified.  Tumor extends into the and invades the wall of the vena cava (pT3c).  Vascular, ureteral and all margins of resection are negative for tumor. - CT scan abdomen with and without contrast on 05/29/2021 showed several nodules along the peritoneal surface of the right nephrectomy bed warranting close attention. - CTAP with and without contrast on 09/18/2021 showed progressive nodularity within the right nephrectomy bed, with enlarged nodules adjacent to the right hepatic lobe extending inferiorly along the right retroperitoneum with multiple enlarged enhancing nodules.  Direct metastatic invasion into the right hepatic lobe.  Small nodule at the right lung base favored to be benign. - IMDC: Intermediate risk group with 2 features including diagnosis less than 1 year and hemoglobin lower than normal. - Bone scan on 10/05/2021 with focal activity in the mental vertex of the mandible which could be inflammatory/traumatic/metastatic. - CT chest on 10/03/2021 showed several lung nodules scattered bilaterally some of which could be metastatic and many others could be due to mucoid impaction. - NGS testing did not show any targetable mutations.  TMB was low.  CCND3 amplified.  PBRM1 VUS present. - Right nephrectomy bed biopsy (10/22/2021): Clear-cell renal cell carcinoma with necrosis - 4 cycles of ipilimumab  and nivolumab  from 11/19/2021 through 01/24/2022 - CT CAP  on 02/14/2022: Numerous new and enlarged lung nodules.  Significant interval increase in the mass in the right renal bed.  New hypodense liver lesions. - Lenvatinib  (12 mg) and everolimus  5 mg daily started on 03/13/2022, discontinued on 07/22/2023 due to progression. - Belzutifan  120 mg daily started on 07/29/2023, discontinued due to progression. - Cabozantinib 40 mg daily started on 10/20/2023.   2. Social/family history: - She lives at home with her husband and also takes care of her mother. - She works as a Child psychotherapist at [employer].  She quit smoking in 2004 and smoked half pack per day for 10 years. - Half sister (same mother) had kidney cancer at age 33.  Maternal grandfather had kidney cancer.  Sister died of lung cancer.  2 half sisters (same father) had breast cancers.  3.  Left breast stage I tubular adenocarcinoma: - She underwent left mastectomy on 06/11/2002.  0/6 lymph nodes positive.  ER 90%, PR 92%, Ki-67 6%, HER2 negative.  As per chart, declined antiestrogen therapy.    Plan: 1. Metastatic clear-cell RCC, intermediate risk by IMDC: - She is tolerating cabozantinib 40 mg daily very well. - CT CAP (02/02/2024): Decrease in mediastinal adenopathy, right pleural effusion, confluent mass in the right lower lobe.  Stable nodal mass in the porta hepatis and metastatic disease along the periaortic retroperitoneum.  Mild decrease in several hepatic metastasis. - Labs today: AST improved to 49.  Rest of the comprehensive metabolic panel is grossly within normal limits.-White count and platelet count is normal.  Hemoglobin is 8.2 from myelosuppression. - Recommend continuing cabozantinib 40 mg daily.  She has applied for Medicaid.  RTC 6 weeks for follow-up with  labs.  2.  Normocytic anemia: - Hemoglobin has decreased since cabozantinib started.  This is from myelosuppression.  Hemoglobin has been stable in the last 4 weeks.  3.  Hypothyroidism: - Continue Synthroid  125 mcg  daily.  She has been taking consistently since last TSH was 29 on 12/29/2023.   4.  Hypertension: - Continue olmesartan /HCTZ.  Blood pressure today is 123/66.   5.  Right loin pain: - She has not been requiring hydrocodone  since cabozantinib started.   6.  Fluid retention: - Continue Lasix  20 mg every other day as needed.   7.  Nutrition: - She is eating well.  She is drinking Ensure 1 can every other day.  She lost about 3 pounds.  Recommend increasing Ensure to 1 can daily.    Orders Placed This Encounter  Procedures   CBC with Differential    Standing Status:   Future    Expected Date:   03/23/2024    Expiration Date:   02/09/2025   Comprehensive metabolic panel    Standing Status:   Future    Expected Date:   03/23/2024    Expiration Date:   02/09/2025   Magnesium     Standing Status:   Future    Expected Date:   03/23/2024    Expiration Date:   02/09/2025   Iron  and TIBC ([facility] ONLY)    Standing Status:   Future    Expected Date:   03/23/2024    Expiration Date:   02/09/2025   Ferritin    Standing Status:   Future    Expected Date:   03/23/2024    Expiration Date:   02/09/2025      Hurman Maiden R Teague,acting as a scribe for Paulett Boros, MD.,have documented all relevant documentation on the behalf of Paulett Boros, MD,as directed by  Paulett Boros, MD while in the presence of Paulett Boros, MD.  I, Paulett Boros MD, have reviewed the above documentation for accuracy and completeness, and I agree with the above.    Paulett Boros, MD   5/6/202512:45 PM  CHIEF COMPLAINT:   Diagnosis: metastatic clear-cell renal cell carcinoma    Cancer Staging  Malignant neoplasm of female breast [facility]) Staging form: Breast, AJCC 6th Edition - Clinical: Stage I (T1b, N0, M0) - Signed by Doretta Gant, PA on 08/16/2011  Renal cell cancer [facility]) Staging form: Kidney, AJCC 8th Edition - Clinical stage from 09/26/2021: Stage IV (cT3c,  cNX, cM1) - Unsigned    Prior Therapy: 1. Right nephrectomy, 02/06/21 2. Nivolumab  plus ipilimumab , 4 cycles, 11/19/21 - 01/24/22 3. lenvatinib  and everolimus , 03/13/22 - 07/22/23 4. Belzutifan , 07/29/23 - 10/2023  Current Therapy:  Cabozantinib    HISTORY OF PRESENT ILLNESS:   Oncology History  Renal cell cancer (HCC)  06/20/2021 Initial Diagnosis   Renal cell carcinoma of right kidney (HCC)   11/19/2021 - 01/24/2022 Chemotherapy   Patient is on Treatment Plan : RENAL CELL CARCINOMA Nivolumab  + Ipilimumab  q21d / Nivolumab  q28d      Genetic Testing   Negative genetic testing. No pathogenic variants identified on the [facility] Multi-Cancer Panel + RNA. VUS in PMS2 called c.1732C>A and in RECQL4 called c.2967G>A  Identified. The report date is 11/01/2021.   The Multi-Cancer Panel + RNA offered by [facility] includes sequencing and/or deletion duplication testing of the following 84 genes: AIP, ALK, APC, ATM, AXIN2,BAP1,  BARD1, BLM, BMPR1A, BRCA1, BRCA2, BRIP1, CASR, CDC73, CDH1, CDK4, CDKN1B, CDKN1C, CDKN2A (p14ARF), CDKN2A (p16INK4a), CEBPA, CHEK2, CTNNA1, DICER1,  DIS3L2, EGFR (c.2369C>T, p.Thr790Met variant only), EPCAM (Deletion/duplication testing only), FH, FLCN, GATA2, GPC3, GREM1 (Promoter region deletion/duplication testing only), HOXB13 (c.251G>A, p.Gly84Glu), HRAS, KIT, MAX, MEN1, MET, MITF (c.952G>A, p.Glu318Lys variant only), MLH1, MSH2, MSH3, MSH6, MUTYH, NBN, NF1, NF2, NTHL1, PALB2, PDGFRA, PHOX2B, PMS2, POLD1, POLE, POT1, PRKAR1A, PTCH1, PTEN, RAD50, RAD51C, RAD51D, RB1, RECQL4, RET, RUNX1, SDHAF2, SDHA (sequence changes only), SDHB, SDHC, SDHD, SMAD4, SMARCA4, SMARCB1, SMARCE1, STK11, SUFU, TERC, TERT, TMEM127, TP53, TSC1, TSC2, VHL, WRN and WT1.       INTERVAL HISTORY:   Deanna Bush is a 63 y.o. female presenting to clinic today for follow up of metastatic clear-cell renal cell carcinoma. She was last seen by me on 12/29/23.  Since her last visit, she underwent CT CAP on 02/02/24 that found:  Overall stable exam. Stable pulmonary metastasis. Slight decrease in size of mediastinal adenopathy. Decrease in size of RIGHT pleural effusion. Decrease in the confluent mass in the RIGHT lower lobe. Stable expansile mass involving the RIGHT twelfth rib. Tenth rib involvement is slightly worse. Mild decrease in size of several hepatic metastasis. Extensive confluent disease in the posterior RIGHT hepatic lobe. Stable nodal mass within the porta hepatis and metastatic disease along the periaortic retroperitoneum. Stable peritoneal bulky metastasis.  Today, she states that she is doing well overall. Her appetite level is at 100%. Her energy level is at 100%. Since her last visit, Elvita has lost 3 pounds and notes she has decreased her food intake. She is drinking 1 Ensure very other day and is agreeable to start drinking it once a day. She notes one episode of diarrhea this morning.   Ellee has received one bottle of cabozantinib and states she has applied for Medicare, which will hopefully get approved within 1 month.   She is taking her blood pressure medication and magnesium  as prescribed, lasix  every other day, and is not requiring pain medication. Cathy recently started taking Synthroid  as prescribed. She has regular follow-up with Dr. Monte Antonio.   PAST MEDICAL HISTORY:   Past Medical History: Past Medical History:  Diagnosis Date   Adenocarcinoma of breast (HCC)    left    Cellulitis of leg, left    Complication of anesthesia    Hard to wake up   Depression    Diabetes mellitus without complication (HCC)    Pt denies   Family history of breast cancer 08/16/2011   Family history of breast cancer    Family history of colon cancer    Family history of kidney cancer    Family history of prostate cancer    Hyperlipidemia    Hypertension    Hypothyroidism    MRSA (methicillin resistant staph aureus) culture positive 08/19/2011   Pre-diabetes     Surgical History: Past Surgical History:   Procedure Laterality Date   BREAST SURGERY Left    mastectomy   CATARACT EXTRACTION W/PHACO Right 06/06/2023   Procedure: CATARACT EXTRACTION PHACO AND INTRAOCULAR LENS PLACEMENT (IOC);  Surgeon: Tarri Farm, MD;  Location: [facility];  Service: Ophthalmology;  Laterality: Right;  CDE 5.61   CATARACT EXTRACTION W/PHACO Left 07/21/2023   Procedure: CATARACT EXTRACTION PHACO AND INTRAOCULAR LENS PLACEMENT (IOC);  Surgeon: Tarri Farm, MD;  Location: [facility];  Service: Ophthalmology;  Laterality: Left;  CDE: 5.96   CESAREAN SECTION     x2   COLONOSCOPY N/A 03/03/2020   Procedure: COLONOSCOPY;  Surgeon: Suzette Espy, MD;  Location: [facility];  Service: Endoscopy;  Laterality: N/A;  12:00  IR IMAGING GUIDED PORT INSERTION  11/07/2021   left mastectomy     NEPHRECTOMY Right 02/06/2021   Procedure: NEPHRECTOMY- open radical;  Surgeon: Marco Severs, MD;  Location: [facility];  Service: Urology;  Laterality: Right;   POLYPECTOMY  03/03/2020   Procedure: POLYPECTOMY;  Surgeon: Suzette Espy, MD;  Location: [facility];  Service: Endoscopy;;    Social History: Social History   Socioeconomic History   Marital status: Married    Spouse name: Not on file   Number of children: 2   Years of education: Not on file   Highest education level: GED or equivalent  Occupational History   Occupation: CNA  Tobacco Use   Smoking status: Former    Current packs/day: 0.00    Average packs/day: 0.5 packs/day for 18.0 years (9.0 ttl pk-yrs)    Types: Cigarettes    Start date: 07/01/1984    Quit date: 07/01/2002    Years since quitting: 21.6   Smokeless tobacco: Never  Vaping Use   Vaping status: Never Used  Substance and Sexual Activity   Alcohol use: No   Drug use: No   Sexual activity: Yes    Birth control/protection: None  Other Topics Concern   Not on file  Social History Narrative   Not on file   Social Drivers of Health   Financial Resource Strain: Medium Risk (08/14/2023)    Overall Financial Resource Strain (CARDIA)    Difficulty of Paying Living Expenses: Somewhat hard  Food Insecurity: No Food Insecurity (12/08/2023)   Hunger Vital Sign    Worried About Running Out of Food in the Last Year: Never true    Ran Out of Food in the Last Year: Never true  Transportation Needs: No Transportation Needs (12/08/2023)   PRAPARE - Administrator, Civil Service (Medical): No    Lack of Transportation (Non-Medical): No  Physical Activity: Insufficiently Active (12/08/2023)   Exercise Vital Sign    Days of Exercise per Week: 3 days    Minutes of Exercise per Session: 20 min  Stress: No Stress Concern Present (12/08/2023)   [employer] of Occupational Health - Occupational Stress Questionnaire    Feeling of Stress : Only a little  Social Connections: Moderately Integrated (12/08/2023)   Social Connection and Isolation Panel [NHANES]    Frequency of Communication with Friends and Family: More than three times a week    Frequency of Social Gatherings with Friends and Family: More than three times a week    Attends Religious Services: More than 4 times per year    Active Member of [employer] or Organizations: Yes    Attends Engineer, structural: More than 4 times per year    Marital Status: Separated  Intimate Partner Violence: Not on file    Family History: Family History  Problem Relation Age of Onset   Alcohol abuse Mother    Hypertension Mother    Diabetes Mother    Hyperlipidemia Mother    Colon cancer Maternal Aunt        dx 19s   Prostate cancer Maternal Uncle    Throat cancer Maternal Uncle    Kidney cancer Maternal Grandfather    Lung cancer Half-Sister    Kidney cancer Half-Sister 23   Breast cancer Half-Sister 60   Breast cancer Half-Sister 45    Current Medications:  Current Outpatient Medications:    acetaminophen  (TYLENOL ) 500 MG tablet, Take 500 mg by mouth every 6 (six) hours  as needed for moderate pain or headache., Disp: ,  Rfl:    atorvastatin  (LIPITOR) 40 MG tablet, Take 1 tablet (40 mg total) by mouth daily., Disp: 90 tablet, Rfl: 1   cabozantinib (CABOMETYX ) 40 MG tablet, Take 1 tablet (40 mg total) by mouth daily. Take on an empty stomach, 1 hour before or 2 hours after meals., Disp: 30 tablet, Rfl: 2   EPINEPHrine  0.3 mg/0.3 mL IJ SOAJ injection, Inject 0.3 mg into the muscle as needed for anaphylaxis., Disp: 2 each, Rfl: 0   furosemide  (LASIX ) 20 MG tablet, TAKE 1 TABLET(20 MG) BY MOUTH DAILY AS NEEDED, Disp: 30 tablet, Rfl: 3   HYDROcodone -acetaminophen  (NORCO) 5-325 MG tablet, Take 1 tablet by mouth every 6 (six) hours as needed for moderate pain (pain score 4-6)., Disp: 30 tablet, Rfl: 0   levothyroxine  (SYNTHROID ) 125 MCG tablet, Take 1 tablet (125 mcg total) by mouth daily before breakfast., Disp: 90 tablet, Rfl: 1   magnesium  oxide (MAG-OX) 400 (240 Mg) MG tablet, Take 1 tablet (400 mg total) by mouth daily., Disp: 30 tablet, Rfl: 4   Multiple Vitamin (MULTIVITAMIN WITH MINERALS) TABS tablet, Take 1 tablet by mouth daily., Disp: , Rfl:    olmesartan -hydrochlorothiazide (BENICAR  HCT) 40-25 MG tablet, TAKE 1 TABLET BY MOUTH DAILY, Disp: 30 tablet, Rfl: 1   UNABLE TO FIND, 6 mastectomy prosthesis and bras., Disp: 6 each, Rfl: 0   urea  (CARMOL) 10 % cream, Apply topically 2 (two) times daily. Apply twice daily to hands, Disp: 71 g, Rfl: 0 No current facility-administered medications for this visit.  Facility-Administered Medications Ordered in Other Visits:    heparin  lock flush 100 unit/mL, 500 Units, Intravenous, Once, Cesar Rogerson, MD   Allergies: Allergies  Allergen Reactions   Amlodipine  Swelling    Leg edema   Sulfonamide Derivatives Itching   Yellow Jacket Venom [Bee Venom] Rash    urticaria    REVIEW OF SYSTEMS:   Review of Systems  Constitutional:  Negative for chills, fatigue and fever.  HENT:   Negative for lump/mass, mouth sores, nosebleeds, sore throat and trouble swallowing.    Eyes:  Negative for eye problems.  Respiratory:  Negative for cough and shortness of breath.   Cardiovascular:  Negative for chest pain, leg swelling and palpitations.  Gastrointestinal:  Positive for diarrhea. Negative for abdominal pain, constipation, nausea and vomiting.  Genitourinary:  Negative for bladder incontinence, difficulty urinating, dysuria, frequency, hematuria and nocturia.   Musculoskeletal:  Negative for arthralgias, back pain, flank pain, myalgias and neck pain.  Skin:  Negative for itching and rash.  Neurological:  Negative for dizziness, headaches and numbness.  Hematological:  Does not bruise/bleed easily.  Psychiatric/Behavioral:  Positive for depression. Negative for sleep disturbance and suicidal ideas. The patient is not nervous/anxious.   All other systems reviewed and are negative.    VITALS:   Blood pressure 123/66, pulse 83, temperature 97.9 F (36.6 C), temperature source Tympanic, resp. rate 18, height 5\' 6"  (1.676 m), weight 144 lb 8 oz (65.5 kg), SpO2 95%.  Wt Readings from Last 3 Encounters:  02/10/24 144 lb 8 oz (65.5 kg)  02/02/24 143 lb 9.6 oz (65.1 kg)  12/29/23 147 lb 0.8 oz (66.7 kg)    Body mass index is 23.32 kg/m.  Performance status (ECOG): 1 - Symptomatic but completely ambulatory  PHYSICAL EXAM:   Physical Exam Vitals and nursing note reviewed. Exam conducted with a chaperone present.  Constitutional:      Appearance: Normal appearance.  Cardiovascular:     Rate and Rhythm: Normal rate and regular rhythm.     Pulses: Normal pulses.     Heart sounds: Normal heart sounds.  Pulmonary:     Effort: Pulmonary effort is normal.     Breath sounds: Normal breath sounds.  Abdominal:     Palpations: Abdomen is soft. There is no hepatomegaly, splenomegaly or mass.     Tenderness: There is no abdominal tenderness.  Musculoskeletal:     Right lower leg: No edema.     Left lower leg: No edema.  Lymphadenopathy:     Cervical: No  cervical adenopathy.     Right cervical: No superficial, deep or posterior cervical adenopathy.    Left cervical: No superficial, deep or posterior cervical adenopathy.     Upper Body:     Right upper body: No supraclavicular or axillary adenopathy.     Left upper body: No supraclavicular or axillary adenopathy.  Neurological:     General: No focal deficit present.     Mental Status: She is alert and oriented to person, place, and time.  Psychiatric:        Mood and Affect: Mood normal.        Behavior: Behavior normal.     LABS:   CBC     Component Value Date/Time   WBC 6.9 02/02/2024 0822   RBC 2.56 (L) 02/02/2024 0822   HGB 8.2 (L) 02/02/2024 0822   HGB 11.1 07/10/2021 1530   HCT 26.0 (L) 02/02/2024 0822   HCT 33.7 (L) 07/10/2021 1530   PLT 328 02/02/2024 0822   PLT 374 07/10/2021 1530   MCV 101.6 (H) 02/02/2024 0822   MCV 89 07/10/2021 1530   MCH 32.0 02/02/2024 0822   MCHC 31.5 02/02/2024 0822   RDW 14.9 02/02/2024 0822   RDW 13.0 07/10/2021 1530   LYMPHSABS 1.6 02/02/2024 0822   LYMPHSABS 2.5 07/10/2021 1530   MONOABS 0.6 02/02/2024 0822   EOSABS 0.1 02/02/2024 0822   EOSABS 0.0 07/10/2021 1530   BASOSABS 0.0 02/02/2024 0822   BASOSABS 0.0 07/10/2021 1530    CMP      Component Value Date/Time   NA 135 02/02/2024 0822   NA 139 07/21/2023 0905   K 4.0 02/02/2024 0822   CL 98 02/02/2024 0822   CO2 25 02/02/2024 0822   GLUCOSE 84 02/02/2024 0822   BUN 20 02/02/2024 0822   BUN 18 07/21/2023 0905   CREATININE 0.60 02/02/2024 0914   CREATININE 0.73 04/22/2019 0848   CALCIUM  9.3 02/02/2024 0822   PROT 7.2 02/02/2024 0822   PROT 6.5 07/21/2023 0905   ALBUMIN 2.9 (L) 02/02/2024 0822   ALBUMIN 3.8 (L) 07/21/2023 0905   AST 49 (H) 02/02/2024 0822   ALT 31 02/02/2024 0822   ALKPHOS 253 (H) 02/02/2024 0822   BILITOT 0.5 02/02/2024 0822   BILITOT 0.4 07/21/2023 0905   GFRNONAA >60 02/02/2024 0822   GFRNONAA 90 04/22/2019 0848   GFRAA 96 10/30/2020 0914    GFRAA 104 04/22/2019 0848     No results found for: "CEA1", "CEA" / No results found for: "CEA1", "CEA" No results found for: "PSA1" No results found for: "BJY782" No results found for: "CAN125"  No results found for: "TOTALPROTELP", "ALBUMINELP", "A1GS", "A2GS", "BETS", "BETA2SER", "GAMS", "MSPIKE", "SPEI" Lab Results  Component Value Date   TIBC 284 08/26/2023   TIBC 308 07/15/2023   TIBC 307 04/29/2023   FERRITIN 272 08/26/2023   FERRITIN 180 07/15/2023   FERRITIN  152 04/29/2023   IRONPCTSAT 17 08/26/2023   IRONPCTSAT 21 07/15/2023   IRONPCTSAT 20 04/29/2023   Lab Results  Component Value Date   LDH 178 07/15/2023   LDH 178 02/21/2022   LDH 152 09/27/2021     STUDIES:   CT CHEST ABDOMEN PELVIS W CONTRAST Result Date: 02/09/2024 CLINICAL DATA:  Metastatic renal cell carcinoma. Evaluate response. Prior radical nephrectomy on 12/2020. * Tracking Code: BO * EXAM: CT CHEST, ABDOMEN, AND PELVIS WITH CONTRAST TECHNIQUE: Multidetector CT imaging of the chest, abdomen and pelvis was performed following the standard protocol during bolus administration of intravenous contrast. RADIATION DOSE REDUCTION: This exam was performed according to the departmental dose-optimization program which includes automated exposure control, adjustment of the mA and/or kV according to patient size and/or use of iterative reconstruction technique. CONTRAST:  OMNIPAQUE  IOHEXOL  300 MG/ML  SOLN COMPARISON:  CT 09/28/2023 FINDINGS: CT CHEST FINDINGS Cardiovascular: No significant vascular findings. Normal heart size. No pericardial effusion. Port in the anterior chest wall with tip in distal SVC. Mediastinum/Nodes: Large prevascular subcarinal nodes again noted. Example node measures 20 mm compared to 26 mm on image 23/2. Subcarinal node measures 20 mm compared to 25 mm. No new mediastinal adenopathy. Lungs/Pleura: Pleural base nodules in the LEFT upper lobe again noted. Anterior LEFT upper lobe nodule  measuring 23 mm compares to 25 mm. Adjacent 27 mm nodule compares to 25 mm. This consolidation in the RIGHT lower lobe with some improvement compared to prior with greater aeration of the RIGHT lower lobe. Is moderate RIGHT effusion remaining. Several scattered smaller nodules appears unchanged. Musculoskeletal: Expansile mass which emanate from the RIGHT twelfth rib again noted measures 8.3 cm (57/2 compared to 8.7 cm. Invasion of the tenth rib also noted on coronal image 132/5) CT ABDOMEN AND PELVIS FINDINGS Hepatobiliary: Multiple hypoenhancing masses within the liver. Example nodule in the dome liver measures 24 x 24 mm compared to 34 x 31 mm (image 46) subcapsular LEFT lateral segment mass measures 2.6 cm 26 mm compared to 26 mm. Confluent metastasis within the posterior RIGHT hepatic lobe measures grossly 8.6 x 7.4 cm compared to 10.4 x 6.0 cm. Confluency nodal mass in the porta hepatis measuring 7.5 x 7.5 cm compares to 7.5 x 8.0 cm. Metastatic nodes extend along the LEFT and RIGHT aorta. Pancreas: Pancreas is normal. No ductal dilatation. No pancreatic inflammation. Spleen: Normal spleen Adrenals/urinary tract: LEFT kidney appears normal. LEFT adrenal gland appears normal. Post RIGHT nephrectomy. Stomach/Bowel: No evidence of bowel obstruction there are masses within the mesentery of the bowel/peritoneal space. Vascular/Lymphatic: Bulky periaortic adenopathy. Reproductive: Unremarkable Other: Bulky peritoneal masses. Mass along the RIGHT psoas measures 7.0 x 6.4 cm compared to 7.6 x 6.5 cm. Peritoneal mass in the pelvis measuring 5.2 cm compares to 5.4 cm. Again noted chest wall invasion emanating or involving the posterior RIGHT twelfth rib as well as the paraspinal musculature on the RIGHT. Metastatic disease approaches the skin surface. Musculoskeletal: No aggressive osseous lesion. IMPRESSION: CHEST: 1. Overall stable exam with mild improvement as below. 2. Stable pulmonary metastasis. 3. Slight decrease  in size of mediastinal adenopathy. 4. Decrease in size of RIGHT pleural effusion. 5. Decrease in the confluent mass in the RIGHT lower lobe. 6. Stable expansile mass involving the RIGHT twelfth rib. Tenth rib involvement is slightly worse. PELVIS: 1. Overall minimal change from comparison exam. Some slight improvement. 2. Mild decrease in size of several hepatic metastasis. Extensive confluent disease in the posterior RIGHT hepatic lobe. 3. Stable  nodal mass within the porta hepatis and metastatic disease along the periaortic retroperitoneum. 4. Stable peritoneal bulky metastasis. Electronically Signed   By: Deboraha Fallow M.D.   On: 02/09/2024 14:08

## 2024-02-26 ENCOUNTER — Telehealth: Payer: Self-pay | Admitting: "Endocrinology

## 2024-02-26 DIAGNOSIS — E890 Postprocedural hypothyroidism: Secondary | ICD-10-CM

## 2024-02-26 MED ORDER — LEVOTHYROXINE SODIUM 125 MCG PO TABS: 125.0000 ug | ORAL_TABLET | Freq: Every day | ORAL | 0 refills | Status: DC

## 2024-02-26 NOTE — Telephone Encounter (Signed)
 Refill has been sent for 30 day supply to the requested pharmacy.   Requested Prescriptions   Signed Prescriptions Disp Refills   levothyroxine  (SYNTHROID ) 125 MCG tablet 30 tablet 0    Sig: Take 1 tablet (125 mcg total) by mouth daily before breakfast.    Authorizing Provider: NIDA, GEBRESELASSIE W    Ordering User: Vernon Goodpasture

## 2024-02-26 NOTE — Telephone Encounter (Signed)
 Pt sees Dr. Monte Antonio 03/31/24 and needs a new RX sent to [facility] on [address] for levothyroxine .  Is only wanting 30 day supply in case RX changes at next appt

## 2024-03-11 ENCOUNTER — Other Ambulatory Visit: Payer: Self-pay | Admitting: *Deleted

## 2024-03-11 ENCOUNTER — Ambulatory Visit (INDEPENDENT_AMBULATORY_CARE_PROVIDER_SITE_OTHER): Payer: Self-pay | Admitting: Psychiatry

## 2024-03-11 ENCOUNTER — Telehealth: Payer: Self-pay | Admitting: "Endocrinology

## 2024-03-11 DIAGNOSIS — F4323 Adjustment disorder with mixed anxiety and depressed mood: Secondary | ICD-10-CM

## 2024-03-11 DIAGNOSIS — E782 Mixed hyperlipidemia: Secondary | ICD-10-CM

## 2024-03-11 MED ORDER — ATORVASTATIN CALCIUM 40 MG PO TABS: 40.0000 mg | ORAL_TABLET | Freq: Every day | ORAL | 1 refills | Status: DC

## 2024-03-11 NOTE — Telephone Encounter (Signed)
 Pt states she needs a refill on Atorvastatin . [facility] on [address]

## 2024-03-11 NOTE — Progress Notes (Signed)
  IN-PERSON  THERAPIST PROGRESS NOTE  Session Time:  Thursday  03/11/2024 3:10 PM -  3:53 PM   Participation Level: Active  Behavioral Response: CasualAlert/Euthymic   Type of Therapy: Individual Therapy  Treatment Goals addressed: Have healthy grieving process around loss Begin verbalizing feelings associated with loss     ProgressTowards Goals: Progressing  Interventions: Supportive/CBT  Summary: DEAUN ROCHA is a 63 y.o. female who is referred for services by PCP Dr. Rodolph Clap due to pt experiencing symptoms of anxiety along with grief and loss issues. Pt denies any psychiatric hospitalizations. Pt has no previous involvement in outpatient therapy.  Per patient's report her stepdaughter was diagnosed with colon cancer at age 2 and died in 11-11-22.  Patient was her caretaker and says patient died at her home.  Patient reports her husband left her about 2 weeks ago for 57 year old after 30 years of marriage.  Patient reports additional stress regarding providing care for her 73 year old mother.  Patient also has her own health issues as she was diagnosed with kidney cancer 2 years ago and had chemo.  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 She currently is taking experimental drugs.  Patient reports crying spells, sadness, irritability, worry, anxiety, and sleeping difficulty.   Patient last was seen about 4 weeks ago.  She states being pleased as she is still not experiencing any pain. She continues treatment and  states having good days and bad days.  She is staying involved in trying to complete everyday tasks and is planning to travel later this summer.  However, patient reports not being happy.  She also reports just not feeling motivated.  She reports not being outgoing like she used and states she does not trust people.  She reports being fearful of being hurt again as she was betrayed by her husband.  Patient reports she has been trying to acknowledge  feelings more by journaling and reports this has been helpful.  Suicidal/Homicidal: Nowithout intent/plan  Therapist Response: Reviewed symptoms, praised and reinforced pt's involvement in activity, discussed stressors, facilitated expression of thoughts and feelings, validated feelings, assisted patient identify/examine her thought patterns regarding trust/self and trust/others, assisted patient challenge and replace thought of being hurt if she should trust anyone with more rational thoughts , discussed trust being on a continuum rather than black-and-white thinking, assisted patient identify ways to increase behavioral activation through value congruent activities, assisted patient identify possible activities, provided patient with activity menu, also developed plan with patient to use daily planning. Diagnosis: Adjustment disorder with mixed anxiety and depressed mood      Collaboration of Care: Primary Care Provider AEB patient is seeing PCP Dr. Alberteen Huge for medication management  Patient/Guardian was advised Release of Information must be obtained prior to any record release in order to collaborate their care with an outside provider. Patient/Guardian was advised if they have not already done so to contact the registration department to sign all necessary forms in order for us  to release information regarding their care.   Consent: Patient/Guardian gives verbal consent for treatment and assignment of benefits for services provided during this visit. Patient/Guardian expressed understanding and agreed to proceed.   Dasia Guerrier E Neylan Koroma, LCSW6/02/2024

## 2024-03-11 NOTE — Telephone Encounter (Signed)
A prescription has been sent

## 2024-03-16 ENCOUNTER — Inpatient Hospital Stay: Payer: Self-pay | Attending: Hematology

## 2024-03-16 DIAGNOSIS — Z800 Family history of malignant neoplasm of digestive organs: Secondary | ICD-10-CM | POA: Insufficient documentation

## 2024-03-16 DIAGNOSIS — C787 Secondary malignant neoplasm of liver and intrahepatic bile duct: Secondary | ICD-10-CM | POA: Diagnosis not present

## 2024-03-16 DIAGNOSIS — Z905 Acquired absence of kidney: Secondary | ICD-10-CM | POA: Insufficient documentation

## 2024-03-16 DIAGNOSIS — Z853 Personal history of malignant neoplasm of breast: Secondary | ICD-10-CM | POA: Insufficient documentation

## 2024-03-16 DIAGNOSIS — R609 Edema, unspecified: Secondary | ICD-10-CM | POA: Diagnosis not present

## 2024-03-16 DIAGNOSIS — Z8051 Family history of malignant neoplasm of kidney: Secondary | ICD-10-CM | POA: Diagnosis not present

## 2024-03-16 DIAGNOSIS — Z803 Family history of malignant neoplasm of breast: Secondary | ICD-10-CM | POA: Insufficient documentation

## 2024-03-16 DIAGNOSIS — D6481 Anemia due to antineoplastic chemotherapy: Secondary | ICD-10-CM | POA: Diagnosis not present

## 2024-03-16 DIAGNOSIS — E039 Hypothyroidism, unspecified: Secondary | ICD-10-CM | POA: Diagnosis not present

## 2024-03-16 DIAGNOSIS — Z8042 Family history of malignant neoplasm of prostate: Secondary | ICD-10-CM | POA: Insufficient documentation

## 2024-03-16 DIAGNOSIS — I10 Essential (primary) hypertension: Secondary | ICD-10-CM | POA: Diagnosis not present

## 2024-03-16 DIAGNOSIS — J90 Pleural effusion, not elsewhere classified: Secondary | ICD-10-CM | POA: Diagnosis not present

## 2024-03-16 DIAGNOSIS — C641 Malignant neoplasm of right kidney, except renal pelvis: Secondary | ICD-10-CM

## 2024-03-16 DIAGNOSIS — D509 Iron deficiency anemia, unspecified: Secondary | ICD-10-CM

## 2024-03-16 DIAGNOSIS — Z79890 Hormone replacement therapy (postmenopausal): Secondary | ICD-10-CM | POA: Insufficient documentation

## 2024-03-16 DIAGNOSIS — Z801 Family history of malignant neoplasm of trachea, bronchus and lung: Secondary | ICD-10-CM | POA: Insufficient documentation

## 2024-03-16 DIAGNOSIS — Z79899 Other long term (current) drug therapy: Secondary | ICD-10-CM | POA: Insufficient documentation

## 2024-03-16 DIAGNOSIS — Z87891 Personal history of nicotine dependence: Secondary | ICD-10-CM | POA: Diagnosis not present

## 2024-03-16 LAB — CBC WITH DIFFERENTIAL/PLATELET
Abs Immature Granulocytes: 0.01 10*3/uL (ref 0.00–0.07)
Basophils Absolute: 0 10*3/uL (ref 0.0–0.1)
Basophils Relative: 0 %
Eosinophils Absolute: 0.1 10*3/uL (ref 0.0–0.5)
Eosinophils Relative: 1 %
HCT: 29.8 % — ABNORMAL LOW (ref 36.0–46.0)
Hemoglobin: 9.4 g/dL — ABNORMAL LOW (ref 12.0–15.0)
Immature Granulocytes: 0 %
Lymphocytes Relative: 26 %
Lymphs Abs: 1.3 10*3/uL (ref 0.7–4.0)
MCH: 33.3 pg (ref 26.0–34.0)
MCHC: 31.5 g/dL (ref 30.0–36.0)
MCV: 105.7 fL — ABNORMAL HIGH (ref 80.0–100.0)
Monocytes Absolute: 0.4 10*3/uL (ref 0.1–1.0)
Monocytes Relative: 9 %
Neutro Abs: 3.1 10*3/uL (ref 1.7–7.7)
Neutrophils Relative %: 64 %
Platelets: 352 10*3/uL (ref 150–400)
RBC: 2.82 MIL/uL — ABNORMAL LOW (ref 3.87–5.11)
RDW: 13.7 % (ref 11.5–15.5)
WBC: 4.8 10*3/uL (ref 4.0–10.5)
nRBC: 0 % (ref 0.0–0.2)

## 2024-03-16 LAB — SAMPLE TO BLOOD BANK

## 2024-03-16 LAB — COMPREHENSIVE METABOLIC PANEL WITH GFR
ALT: 26 U/L (ref 0–44)
AST: 42 U/L — ABNORMAL HIGH (ref 15–41)
Albumin: 2.7 g/dL — ABNORMAL LOW (ref 3.5–5.0)
Alkaline Phosphatase: 191 U/L — ABNORMAL HIGH (ref 38–126)
Anion gap: 12 (ref 5–15)
BUN: 26 mg/dL — ABNORMAL HIGH (ref 8–23)
CO2: 24 mmol/L (ref 22–32)
Calcium: 9.7 mg/dL (ref 8.9–10.3)
Chloride: 98 mmol/L (ref 98–111)
Creatinine, Ser: 0.95 mg/dL (ref 0.44–1.00)
GFR, Estimated: >60 mL/min (ref >60)
Glucose, Bld: 92 mg/dL (ref 70–99)
Potassium: 4.4 mmol/L (ref 3.5–5.1)
Sodium: 134 mmol/L — ABNORMAL LOW (ref 135–145)
Total Bilirubin: 0.4 mg/dL (ref 0.0–1.2)
Total Protein: 7.4 g/dL (ref 6.5–8.1)

## 2024-03-16 LAB — IRON AND TIBC
Iron: 57 ug/dL (ref 28–170)
Saturation Ratios: 23 % (ref 10.4–31.8)
TIBC: 244 ug/dL — ABNORMAL LOW (ref 250–450)
UIBC: 187 ug/dL

## 2024-03-16 LAB — FERRITIN: Ferritin: 485 ng/mL — ABNORMAL HIGH (ref 11–307)

## 2024-03-16 LAB — MAGNESIUM: Magnesium: 1.8 mg/dL (ref 1.7–2.4)

## 2024-03-17 ENCOUNTER — Other Ambulatory Visit: Payer: Self-pay | Admitting: "Endocrinology

## 2024-03-17 ENCOUNTER — Other Ambulatory Visit: Payer: Self-pay

## 2024-03-17 DIAGNOSIS — E782 Mixed hyperlipidemia: Secondary | ICD-10-CM

## 2024-03-17 MED ORDER — ATORVASTATIN CALCIUM 40 MG PO TABS: 40.0000 mg | ORAL_TABLET | Freq: Every day | ORAL | 1 refills | Status: AC

## 2024-03-23 ENCOUNTER — Inpatient Hospital Stay (HOSPITAL_BASED_OUTPATIENT_CLINIC_OR_DEPARTMENT_OTHER): Payer: Self-pay | Admitting: Hematology

## 2024-03-23 VITALS — BP 119/63 | HR 93 | Temp 96.6°F | Resp 16 | Wt 145.5 lb

## 2024-03-23 DIAGNOSIS — Z87891 Personal history of nicotine dependence: Secondary | ICD-10-CM | POA: Diagnosis not present

## 2024-03-23 DIAGNOSIS — R609 Edema, unspecified: Secondary | ICD-10-CM | POA: Diagnosis not present

## 2024-03-23 DIAGNOSIS — J90 Pleural effusion, not elsewhere classified: Secondary | ICD-10-CM | POA: Diagnosis not present

## 2024-03-23 DIAGNOSIS — C641 Malignant neoplasm of right kidney, except renal pelvis: Secondary | ICD-10-CM | POA: Diagnosis not present

## 2024-03-23 DIAGNOSIS — E782 Mixed hyperlipidemia: Secondary | ICD-10-CM | POA: Diagnosis not present

## 2024-03-23 DIAGNOSIS — Z8051 Family history of malignant neoplasm of kidney: Secondary | ICD-10-CM | POA: Diagnosis not present

## 2024-03-23 DIAGNOSIS — D6481 Anemia due to antineoplastic chemotherapy: Secondary | ICD-10-CM | POA: Diagnosis not present

## 2024-03-23 DIAGNOSIS — Z905 Acquired absence of kidney: Secondary | ICD-10-CM | POA: Diagnosis not present

## 2024-03-23 DIAGNOSIS — I10 Essential (primary) hypertension: Secondary | ICD-10-CM | POA: Diagnosis not present

## 2024-03-23 DIAGNOSIS — Z853 Personal history of malignant neoplasm of breast: Secondary | ICD-10-CM | POA: Diagnosis not present

## 2024-03-23 DIAGNOSIS — Z79899 Other long term (current) drug therapy: Secondary | ICD-10-CM | POA: Diagnosis not present

## 2024-03-23 DIAGNOSIS — Z8042 Family history of malignant neoplasm of prostate: Secondary | ICD-10-CM | POA: Diagnosis not present

## 2024-03-23 DIAGNOSIS — Z803 Family history of malignant neoplasm of breast: Secondary | ICD-10-CM | POA: Diagnosis not present

## 2024-03-23 DIAGNOSIS — E039 Hypothyroidism, unspecified: Secondary | ICD-10-CM | POA: Diagnosis not present

## 2024-03-23 DIAGNOSIS — C787 Secondary malignant neoplasm of liver and intrahepatic bile duct: Secondary | ICD-10-CM | POA: Diagnosis not present

## 2024-03-23 DIAGNOSIS — E119 Type 2 diabetes mellitus without complications: Secondary | ICD-10-CM | POA: Diagnosis not present

## 2024-03-23 DIAGNOSIS — Z800 Family history of malignant neoplasm of digestive organs: Secondary | ICD-10-CM | POA: Diagnosis not present

## 2024-03-23 DIAGNOSIS — Z79890 Hormone replacement therapy (postmenopausal): Secondary | ICD-10-CM | POA: Diagnosis not present

## 2024-03-23 DIAGNOSIS — E890 Postprocedural hypothyroidism: Secondary | ICD-10-CM | POA: Diagnosis not present

## 2024-03-23 DIAGNOSIS — Z801 Family history of malignant neoplasm of trachea, bronchus and lung: Secondary | ICD-10-CM | POA: Diagnosis not present

## 2024-03-23 NOTE — Progress Notes (Signed)
 Patient is taking Cabometyx as prescribed.  She has not missed any doses and reports no side effects at this time.

## 2024-03-23 NOTE — Patient Instructions (Addendum)
 [facility] at [facility] Discharge Instructions   You were seen and examined today by Dr. Cheree Cords.  He reviewed the results of your lab work which are normal/stable.   Continue Cabometyx  as prescribed.   We will see you back in 7 weeks. We will repeat a CT scan and lab work prior to this visit.    Return as scheduled.    Thank you for choosing [facility] at [facility] to provide your oncology and hematology care.  To afford each patient quality time with our provider, please arrive at least 15 minutes before your scheduled appointment time.   If you have a lab appointment with the Cancer Center please come in thru the Main Entrance and check in at the main information desk.  You need to re-schedule your appointment should you arrive 10 or more minutes late.  We strive to give you quality time with our providers, and arriving late affects you and other patients whose appointments are after yours.  Also, if you no show three or more times for appointments you may be dismissed from the clinic at the providers discretion.     Again, thank you for choosing [facility].  Our hope is that these requests will decrease the amount of time that you wait before being seen by our physicians.       _____________________________________________________________  Should you have questions after your visit to [facility], please contact our office at [phone] and follow the prompts.  Our office hours are 8:00 a.m. and 4:30 p.m. Monday - Friday.  Please note that voicemails left after 4:00 p.m. may not be returned until the following business day.  We are closed weekends and major holidays.  You do have access to a nurse 24-7, just call the main number to the clinic [phone] and do not press any options, hold on the line and a nurse will answer the phone.    For prescription refill requests, have your pharmacy contact our  office and allow 72 hours.    Due to Covid, you will need to wear a mask upon entering the hospital. If you do not have a mask, a mask will be given to you at the Main Entrance upon arrival. For doctor visits, patients may have 1 support person age 31 or older with them. For treatment visits, patients can not have anyone with them due to social distancing guidelines and our immunocompromised population.

## 2024-03-23 NOTE — Progress Notes (Signed)
 [facility] [address], [place] [address]    Clinic Day:  03/23/2024  Referring physician: Towanda Fret, MD  Patient Care Team: Towanda Fret, MD as PCP - General Mallipeddi, Kennyth Pean, MD as PCP - Cardiology (Cardiology) Paulett Boros, MD as Medical Oncologist (Medical Oncology)   ASSESSMENT & PLAN:   Assessment: 1. Metastatic clear-cell renal cell carcinoma: - Right radical nephrectomy on 02/06/2021. - Pathology shows clear-cell RCC, grade 4, 12.5 cm, necrosis and rhabdoid features are identified.  Tumor extends into the and invades the wall of the vena cava (pT3c).  Vascular, ureteral and all margins of resection are negative for tumor. - CT scan abdomen with and without contrast on 05/29/2021 showed several nodules along the peritoneal surface of the right nephrectomy bed warranting close attention. - CTAP with and without contrast on 09/18/2021 showed progressive nodularity within the right nephrectomy bed, with enlarged nodules adjacent to the right hepatic lobe extending inferiorly along the right retroperitoneum with multiple enlarged enhancing nodules.  Direct metastatic invasion into the right hepatic lobe.  Small nodule at the right lung base favored to be benign. - IMDC: Intermediate risk group with 2 features including diagnosis less than 1 year and hemoglobin lower than normal. - Bone scan on 10/05/2021 with focal activity in the mental vertex of the mandible which could be inflammatory/traumatic/metastatic. - CT chest on 10/03/2021 showed several lung nodules scattered bilaterally some of which could be metastatic and many others could be due to mucoid impaction. - NGS testing did not show any targetable mutations.  TMB was low.  CCND3 amplified.  PBRM1 VUS present. - Right nephrectomy bed biopsy (10/22/2021): Clear-cell renal cell carcinoma with necrosis - 4 cycles of ipilimumab  and nivolumab  from 11/19/2021 through 01/24/2022 - CT CAP  on 02/14/2022: Numerous new and enlarged lung nodules.  Significant interval increase in the mass in the right renal bed.  New hypodense liver lesions. - Lenvatinib  (12 mg) and everolimus  5 mg daily started on 03/13/2022, discontinued on 07/22/2023 due to progression. - Belzutifan  120 mg daily started on 07/29/2023, discontinued due to progression. - Cabozantinib 40 mg daily started on 10/20/2023.   2. Social/family history: - She lives at home with her husband and also takes care of her mother. - She works as a Child psychotherapist at [employer].  She quit smoking in 2004 and smoked half pack per day for 10 years. - Half sister (same mother) had kidney cancer at age 36.  Maternal grandfather had kidney cancer.  Sister died of lung cancer.  2 half sisters (same father) had breast cancers.  3.  Left breast stage I tubular adenocarcinoma: - She underwent left mastectomy on 06/11/2002.  0/6 lymph nodes positive.  ER 90%, PR 92%, Ki-67 6%, HER2 negative.  As per chart, declined antiestrogen therapy.    Plan: 1. Metastatic clear-cell RCC, intermediate risk by IMDC: - CT CAP (02/02/2024): Decrease in mediastinal adenopathy coronoid pleural effusion, confluent mass in the right lower lobe.  Stable nodal mass in the porta hepatis and metastatic disease is along the periaortic retroperitoneum. - She is tolerating cabozantinib very well. - Right loin mass is stable. - Labs from 03/16/2024: Alk phos and AST are improving.  Albumin is low at 2.7.  CBC with normal white count and platelet count. - Recommend continuing cabozantinib 40 mg daily.  RTC 7 weeks with repeat CT CAP with contrast.  2.  Normocytic anemia: - Hemoglobin is stable at 9.4.  Anemia from cabozantinib.  3.  Hypothyroidism: - She will continue Synthroid  125 mcg daily.  Continue follow-up with Dr. Nida.   4.  Hypertension: - Continue olmesartan /HCTZ.  Blood pressure is 119/63.   5.  Right loin pain: - She has not required hydrocodone   in more than 2 months.  She does not have any pain.   6.  Fluid retention: - Continue Lasix  20 mg every other day as needed.   7.  Nutrition: - She has good appetite and is eating well.  Continue Ensure 1 can daily.  Weight is stable.    Orders Placed This Encounter  Procedures   CT CHEST ABDOMEN PELVIS W CONTRAST    Standing Status:   Future    Expected Date:   04/22/2024    Expiration Date:   03/23/2025    If indicated for the ordered procedure, I authorize the administration of contrast media per Radiology protocol:   Yes    Does the patient have a contrast media/X-ray dye allergy?:   No    Preferred imaging location?:   [facility]    If indicated for the ordered procedure, I authorize the administration of oral contrast media per Radiology protocol:   Yes   CBC with Differential    Standing Status:   Future    Expected Date:   05/04/2024    Expiration Date:   08/02/2024   Comprehensive metabolic panel    Standing Status:   Future    Expected Date:   05/04/2024    Expiration Date:   08/02/2024   Magnesium     Standing Status:   Future    Expected Date:   05/04/2024    Expiration Date:   08/02/2024      Hurman Maiden R Teague,acting as a scribe for Paulett Boros, MD.,have documented all relevant documentation on the behalf of Paulett Boros, MD,as directed by  Paulett Boros, MD while in the presence of Paulett Boros, MD.  I, Paulett Boros MD, have reviewed the above documentation for accuracy and completeness, and I agree with the above.     Paulett Boros, MD   6/17/202511:56 AM  CHIEF COMPLAINT:   Diagnosis: metastatic clear-cell renal cell carcinoma    Cancer Staging  Malignant neoplasm of female breast [facility]) Staging form: Breast, AJCC 6th Edition - Clinical: Stage I (T1b, N0, M0) - Signed by Doretta Gant, PA on 08/16/2011  Renal cell cancer [facility]) Staging form: Kidney, AJCC 8th Edition - Clinical stage from 09/26/2021:  Stage IV (cT3c, cNX, cM1) - Unsigned    Prior Therapy: 1. Right nephrectomy, 02/06/21 2. Nivolumab  plus ipilimumab , 4 cycles, 11/19/21 - 01/24/22 3. lenvatinib  and everolimus , 03/13/22 - 07/22/23 4. Belzutifan , 07/29/23 - 10/2023  Current Therapy:  Cabozantinib    HISTORY OF PRESENT ILLNESS:   Oncology History  Renal cell cancer (HCC)  06/20/2021 Initial Diagnosis   Renal cell carcinoma of right kidney (HCC)   11/19/2021 - 01/24/2022 Chemotherapy   Patient is on Treatment Plan : RENAL CELL CARCINOMA Nivolumab  + Ipilimumab  q21d / Nivolumab  q28d      Genetic Testing   Negative genetic testing. No pathogenic variants identified on the [facility] Multi-Cancer Panel + RNA. VUS in PMS2 called c.1732C>A and in RECQL4 called c.2967G>A  Identified. The report date is 11/01/2021.   The Multi-Cancer Panel + RNA offered by [facility] includes sequencing and/or deletion duplication testing of the following 84 genes: AIP, ALK, APC, ATM, AXIN2,BAP1,  BARD1, BLM, BMPR1A, BRCA1, BRCA2, BRIP1, CASR, CDC73,  CDH1, CDK4, CDKN1B, CDKN1C, CDKN2A (p14ARF), CDKN2A (p16INK4a), CEBPA, CHEK2, CTNNA1, DICER1, DIS3L2, EGFR (c.2369C>T, p.Thr790Met variant only), EPCAM (Deletion/duplication testing only), FH, FLCN, GATA2, GPC3, GREM1 (Promoter region deletion/duplication testing only), HOXB13 (c.251G>A, p.Gly84Glu), HRAS, KIT, MAX, MEN1, MET, MITF (c.952G>A, p.Glu318Lys variant only), MLH1, MSH2, MSH3, MSH6, MUTYH, NBN, NF1, NF2, NTHL1, PALB2, PDGFRA, PHOX2B, PMS2, POLD1, POLE, POT1, PRKAR1A, PTCH1, PTEN, RAD50, RAD51C, RAD51D, RB1, RECQL4, RET, RUNX1, SDHAF2, SDHA (sequence changes only), SDHB, SDHC, SDHD, SMAD4, SMARCA4, SMARCB1, SMARCE1, STK11, SUFU, TERC, TERT, TMEM127, TP53, TSC1, TSC2, VHL, WRN and WT1.       INTERVAL HISTORY:   Deanna Bush is a 63 y.o. female presenting to clinic today for follow up of metastatic clear-cell renal cell carcinoma. She was last seen by me on 02/10/24.  Today, she states that she is doing well overall.  Her appetite level is at 100%. Her energy level is at 100%.  Allure reports a healthy appetite and drinks 1 Ensure a day. She denies any recent pain and has not required oxycodone  or other pain medication for over 2 months. She is taking lasix  every other day, synthroid  as prescribed, and takes her hypertension medication most days.   Jasira has regular follow-up scheduled with Dr. Monte Antonio.   PAST MEDICAL HISTORY:   Past Medical History: Past Medical History:  Diagnosis Date   Adenocarcinoma of breast (HCC)    left    Cellulitis of leg, left    Complication of anesthesia    Hard to wake up   Depression    Diabetes mellitus without complication (HCC)    Pt denies   Family history of breast cancer 08/16/2011   Family history of breast cancer    Family history of colon cancer    Family history of kidney cancer    Family history of prostate cancer    Hyperlipidemia    Hypertension    Hypothyroidism    MRSA (methicillin resistant staph aureus) culture positive 08/19/2011   Pre-diabetes     Surgical History: Past Surgical History:  Procedure Laterality Date   BREAST SURGERY Left    mastectomy   CATARACT EXTRACTION W/PHACO Right 06/06/2023   Procedure: CATARACT EXTRACTION PHACO AND INTRAOCULAR LENS PLACEMENT (IOC);  Surgeon: Tarri Farm, MD;  Location: [facility];  Service: Ophthalmology;  Laterality: Right;  CDE 5.61   CATARACT EXTRACTION W/PHACO Left 07/21/2023   Procedure: CATARACT EXTRACTION PHACO AND INTRAOCULAR LENS PLACEMENT (IOC);  Surgeon: Tarri Farm, MD;  Location: [facility];  Service: Ophthalmology;  Laterality: Left;  CDE: 5.96   CESAREAN SECTION     x2   COLONOSCOPY N/A 03/03/2020   Procedure: COLONOSCOPY;  Surgeon: Suzette Espy, MD;  Location: [facility];  Service: Endoscopy;  Laterality: N/A;  12:00   IR IMAGING GUIDED PORT INSERTION  11/07/2021   left mastectomy     NEPHRECTOMY Right 02/06/2021   Procedure: NEPHRECTOMY- open radical;  Surgeon: Marco Severs, MD;   Location: [facility];  Service: Urology;  Laterality: Right;   POLYPECTOMY  03/03/2020   Procedure: POLYPECTOMY;  Surgeon: Suzette Espy, MD;  Location: [facility];  Service: Endoscopy;;    Social History: Social History   Socioeconomic History   Marital status: Married    Spouse name: Not on file   Number of children: 2   Years of education: Not on file   Highest education level: GED or equivalent  Occupational History   Occupation: CNA  Tobacco Use   Smoking status: Former  Current packs/day: 0.00    Average packs/day: 0.5 packs/day for 18.0 years (9.0 ttl pk-yrs)    Types: Cigarettes    Start date: 07/01/1984    Quit date: 07/01/2002    Years since quitting: 21.7   Smokeless tobacco: Never  Vaping Use   Vaping status: Never Used  Substance and Sexual Activity   Alcohol use: No   Drug use: No   Sexual activity: Yes    Birth control/protection: None  Other Topics Concern   Not on file  Social History Narrative   Not on file   Social Drivers of Health   Financial Resource Strain: Medium Risk (08/14/2023)   Overall Financial Resource Strain (CARDIA)    Difficulty of Paying Living Expenses: Somewhat hard  Food Insecurity: No Food Insecurity (12/08/2023)   Hunger Vital Sign    Worried About Running Out of Food in the Last Year: Never true    Ran Out of Food in the Last Year: Never true  Transportation Needs: No Transportation Needs (12/08/2023)   PRAPARE - Administrator, Civil Service (Medical): No    Lack of Transportation (Non-Medical): No  Physical Activity: Insufficiently Active (12/08/2023)   Exercise Vital Sign    Days of Exercise per Week: 3 days    Minutes of Exercise per Session: 20 min  Stress: No Stress Concern Present (12/08/2023)   [employer] of Occupational Health - Occupational Stress Questionnaire    Feeling of Stress : Only a little  Social Connections: Moderately Integrated (12/08/2023)   Social Connection and Isolation Panel     Frequency of Communication with Friends and Family: More than three times a week    Frequency of Social Gatherings with Friends and Family: More than three times a week    Attends Religious Services: More than 4 times per year    Active Member of [employer] or Organizations: Yes    Attends Engineer, structural: More than 4 times per year    Marital Status: Separated  Intimate Partner Violence: Not on file    Family History: Family History  Problem Relation Age of Onset   Alcohol abuse Mother    Hypertension Mother    Diabetes Mother    Hyperlipidemia Mother    Colon cancer Maternal Aunt        dx 52s   Prostate cancer Maternal Uncle    Throat cancer Maternal Uncle    Kidney cancer Maternal Grandfather    Lung cancer Half-Sister    Kidney cancer Half-Sister 48   Breast cancer Half-Sister 63   Breast cancer Half-Sister 23    Current Medications:  Current Outpatient Medications:    acetaminophen  (TYLENOL ) 500 MG tablet, Take 500 mg by mouth every 6 (six) hours as needed for moderate pain or headache., Disp: , Rfl:    atorvastatin  (LIPITOR) 40 MG tablet, Take 1 tablet (40 mg total) by mouth daily., Disp: 30 tablet, Rfl: 1   cabozantinib (CABOMETYX ) 40 MG tablet, Take 1 tablet (40 mg total) by mouth daily. Take on an empty stomach, 1 hour before or 2 hours after meals., Disp: 30 tablet, Rfl: 2   EPINEPHrine  0.3 mg/0.3 mL IJ SOAJ injection, Inject 0.3 mg into the muscle as needed for anaphylaxis., Disp: 2 each, Rfl: 0   furosemide  (LASIX ) 20 MG tablet, TAKE 1 TABLET(20 MG) BY MOUTH DAILY AS NEEDED, Disp: 30 tablet, Rfl: 3   HYDROcodone -acetaminophen  (NORCO) 5-325 MG tablet, Take 1 tablet by mouth every 6 (six) hours as  needed for moderate pain (pain score 4-6)., Disp: 30 tablet, Rfl: 0   levothyroxine  (SYNTHROID ) 125 MCG tablet, Take 1 tablet (125 mcg total) by mouth daily before breakfast., Disp: 30 tablet, Rfl: 0   magnesium  oxide (MAG-OX) 400 (240 Mg) MG tablet, Take 1 tablet  (400 mg total) by mouth daily., Disp: 30 tablet, Rfl: 4   Multiple Vitamin (MULTIVITAMIN WITH MINERALS) TABS tablet, Take 1 tablet by mouth daily., Disp: , Rfl:    olmesartan -hydrochlorothiazide (BENICAR  HCT) 40-25 MG tablet, TAKE 1 TABLET BY MOUTH DAILY, Disp: 30 tablet, Rfl: 1   UNABLE TO FIND, 6 mastectomy prosthesis and bras., Disp: 6 each, Rfl: 0   urea  (CARMOL) 10 % cream, Apply topically 2 (two) times daily. Apply twice daily to hands, Disp: 71 g, Rfl: 0 No current facility-administered medications for this visit.  Facility-Administered Medications Ordered in Other Visits:    heparin  lock flush 100 unit/mL, 500 Units, Intravenous, Once, Belkis Norbeck, MD   Allergies: Allergies  Allergen Reactions   Amlodipine  Swelling    Leg edema   Sulfonamide Derivatives Itching   Yellow Jacket Venom [Bee Venom] Rash    urticaria    REVIEW OF SYSTEMS:   Review of Systems  Constitutional:  Negative for chills, fatigue and fever.  HENT:   Negative for lump/mass, mouth sores, nosebleeds, sore throat and trouble swallowing.   Eyes:  Negative for eye problems.  Respiratory:  Negative for cough and shortness of breath.   Cardiovascular:  Negative for chest pain, leg swelling and palpitations.  Gastrointestinal:  Negative for abdominal pain, constipation, diarrhea, nausea and vomiting.  Genitourinary:  Negative for bladder incontinence, difficulty urinating, dysuria, frequency, hematuria and nocturia.   Musculoskeletal:  Negative for arthralgias, back pain, flank pain, myalgias and neck pain.  Skin:  Negative for itching and rash.  Neurological:  Negative for dizziness, headaches and numbness.  Hematological:  Does not bruise/bleed easily.  Psychiatric/Behavioral:  Negative for depression, sleep disturbance and suicidal ideas. The patient is not nervous/anxious.   All other systems reviewed and are negative.    VITALS:   Blood pressure 119/63, pulse 93, temperature (!) 96.6 F (35.9  C), temperature source Oral, resp. rate 16, weight 145 lb 8.1 oz (66 kg), SpO2 100%.  Wt Readings from Last 3 Encounters:  03/23/24 145 lb 8.1 oz (66 kg)  02/10/24 144 lb 8 oz (65.5 kg)  02/02/24 143 lb 9.6 oz (65.1 kg)    Body mass index is 23.48 kg/m.  Performance status (ECOG): 1 - Symptomatic but completely ambulatory  PHYSICAL EXAM:   Physical Exam Vitals and nursing note reviewed. Exam conducted with a chaperone present.  Constitutional:      Appearance: Normal appearance.   Cardiovascular:     Rate and Rhythm: Normal rate and regular rhythm.     Pulses: Normal pulses.     Heart sounds: Normal heart sounds.  Pulmonary:     Effort: Pulmonary effort is normal.     Breath sounds: Normal breath sounds.  Abdominal:     Palpations: Abdomen is soft. There is hepatomegaly (palpable 3 fingerbreadths below the right costal margin). There is no splenomegaly or mass.     Tenderness: There is no abdominal tenderness.   Musculoskeletal:     Right lower leg: No edema.     Left lower leg: No edema.     Comments: +mass in right loin  Lymphadenopathy:     Cervical: No cervical adenopathy.     Right cervical: No superficial,  deep or posterior cervical adenopathy.    Left cervical: No superficial, deep or posterior cervical adenopathy.     Upper Body:     Right upper body: No supraclavicular or axillary adenopathy.     Left upper body: No supraclavicular or axillary adenopathy.   Neurological:     General: No focal deficit present.     Mental Status: She is alert and oriented to person, place, and time.   Psychiatric:        Mood and Affect: Mood normal.        Behavior: Behavior normal.     LABS:   CBC     Component Value Date/Time   WBC 4.8 03/16/2024 0953   RBC 2.82 (L) 03/16/2024 0953   HGB 9.4 (L) 03/16/2024 0953   HGB 11.1 07/10/2021 1530   HCT 29.8 (L) 03/16/2024 0953   HCT 33.7 (L) 07/10/2021 1530   PLT 352 03/16/2024 0953   PLT 374 07/10/2021 1530   MCV  105.7 (H) 03/16/2024 0953   MCV 89 07/10/2021 1530   MCH 33.3 03/16/2024 0953   MCHC 31.5 03/16/2024 0953   RDW 13.7 03/16/2024 0953   RDW 13.0 07/10/2021 1530   LYMPHSABS 1.3 03/16/2024 0953   LYMPHSABS 2.5 07/10/2021 1530   MONOABS 0.4 03/16/2024 0953   EOSABS 0.1 03/16/2024 0953   EOSABS 0.0 07/10/2021 1530   BASOSABS 0.0 03/16/2024 0953   BASOSABS 0.0 07/10/2021 1530    CMP      Component Value Date/Time   NA 134 (L) 03/16/2024 0953   NA 139 07/21/2023 0905   K 4.4 03/16/2024 0953   CL 98 03/16/2024 0953   CO2 24 03/16/2024 0953   GLUCOSE 92 03/16/2024 0953   BUN 26 (H) 03/16/2024 0953   BUN 18 07/21/2023 0905   CREATININE 0.95 03/16/2024 0953   CREATININE 0.73 04/22/2019 0848   CALCIUM  9.7 03/16/2024 0953   PROT 7.4 03/16/2024 0953   PROT 6.5 07/21/2023 0905   ALBUMIN 2.7 (L) 03/16/2024 0953   ALBUMIN 3.8 (L) 07/21/2023 0905   AST 42 (H) 03/16/2024 0953   ALT 26 03/16/2024 0953   ALKPHOS 191 (H) 03/16/2024 0953   BILITOT 0.4 03/16/2024 0953   BILITOT 0.4 07/21/2023 0905   GFRNONAA >60 03/16/2024 0953   GFRNONAA 90 04/22/2019 0848   GFRAA 96 10/30/2020 0914   GFRAA 104 04/22/2019 0848     No results found for: CEA1, CEA / No results found for: CEA1, CEA No results found for: PSA1 No results found for: AOZ308 No results found for: CAN125  No results found for: Tanner Fanny, A1GS, Arleen Lacer, GAMS, MSPIKE, SPEI Lab Results  Component Value Date   TIBC 244 (L) 03/16/2024   TIBC 284 08/26/2023   TIBC 308 07/15/2023   FERRITIN 485 (H) 03/16/2024   FERRITIN 272 08/26/2023   FERRITIN 180 07/15/2023   IRONPCTSAT 23 03/16/2024   IRONPCTSAT 17 08/26/2023   IRONPCTSAT 21 07/15/2023   Lab Results  Component Value Date   LDH 178 07/15/2023   LDH 178 02/21/2022   LDH 152 09/27/2021     STUDIES:   No results found.

## 2024-03-24 LAB — COMPREHENSIVE METABOLIC PANEL WITH GFR
ALT: 26 IU/L (ref 0–32)
AST: 41 IU/L — ABNORMAL HIGH (ref 0–40)
Albumin: 3.5 g/dL — ABNORMAL LOW (ref 3.9–4.9)
Alkaline Phosphatase: 252 IU/L — ABNORMAL HIGH (ref 44–121)
BUN/Creatinine Ratio: 25 (ref 12–28)
BUN: 28 mg/dL — ABNORMAL HIGH (ref 8–27)
Bilirubin Total: 0.3 mg/dL (ref 0.0–1.2)
CO2: 22 mmol/L (ref 20–29)
Calcium: 9.8 mg/dL (ref 8.7–10.3)
Chloride: 96 mmol/L (ref 96–106)
Creatinine, Ser: 1.13 mg/dL — ABNORMAL HIGH (ref 0.57–1.00)
Globulin, Total: 4.1 g/dL (ref 1.5–4.5)
Glucose: 79 mg/dL (ref 70–99)
Potassium: 4.9 mmol/L (ref 3.5–5.2)
Sodium: 133 mmol/L — ABNORMAL LOW (ref 134–144)
Total Protein: 7.6 g/dL (ref 6.0–8.5)
eGFR: 54 mL/min/{1.73_m2} — ABNORMAL LOW (ref >59)

## 2024-03-24 LAB — LIPID PANEL
Chol/HDL Ratio: 3.3 ratio (ref 0.0–4.4)
Cholesterol, Total: 153 mg/dL (ref 100–199)
HDL: 47 mg/dL (ref >39)
LDL Chol Calc (NIH): 93 mg/dL (ref 0–99)
Triglycerides: 64 mg/dL (ref 0–149)
VLDL Cholesterol Cal: 13 mg/dL (ref 5–40)

## 2024-03-24 LAB — T4, FREE: Free T4: 1.75 ng/dL (ref 0.82–1.77)

## 2024-03-24 LAB — TSH: TSH: 8.82 u[IU]/mL — ABNORMAL HIGH (ref 0.450–4.500)

## 2024-03-25 ENCOUNTER — Ambulatory Visit (INDEPENDENT_AMBULATORY_CARE_PROVIDER_SITE_OTHER): Payer: Self-pay | Admitting: Psychiatry

## 2024-03-25 DIAGNOSIS — F4323 Adjustment disorder with mixed anxiety and depressed mood: Secondary | ICD-10-CM | POA: Diagnosis not present

## 2024-03-25 NOTE — Progress Notes (Signed)
 Virtual Visit via Telephone Note  I connected with Deanna Bush on 03/25/24 at 2:15 PM by telephone and verified that I am speaking with the correct person using two identifiers.  Location: Patient: Home Provider: [facility] [place] office    I discussed the limitations, risks, security and privacy concerns of performing an evaluation and management service by telephone and the availability of in person appointments. I also discussed with the patient that there may be a patient responsible charge related to this service. The patient expressed understanding and agreed to proceed.    I provided 24 minutes of non-face-to-face time during this encounter.   Dicie Foster, LCSW   IN-PERSON  THERAPIST PROGRESS NOTE  Session Time:  Thursday  6/192025 2:15 PM - 2:39 PM  Participation Level: Active  Behavioral Response: CasualAlert/Euthymic   Type of Therapy: Individual Therapy  Treatment Goals addressed: Have healthy grieving process around loss Begin verbalizing feelings associated with loss     ProgressTowards Goals: Progressing  Interventions: Supportive/CBT  Summary: Deanna Bush is a 63 y.o. female who is referred for services by PCP Dr. Rodolph Clap due to pt experiencing symptoms of anxiety along with grief and loss issues. Pt denies any psychiatric hospitalizations. Pt has no previous involvement in outpatient therapy.  Per patient's report her stepdaughter was diagnosed with colon cancer at age 24 and died in October 24, 2022.  Patient was her caretaker and says patient died at her home.  Patient reports her husband left her about 2 weeks ago for 37 year old after 30 years of marriage.  Patient reports additional stress regarding providing care for her 36 year old mother.  Patient also has her own health issues as she was diagnosed with kidney cancer 2 years ago and had chemo.  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 She currently is taking  experimental drugs.  Patient reports crying spells, sadness, irritability, worry, anxiety, and sleeping difficulty.   Patient last was seen about 2  weeks ago.  She reports improved mood and increased behavioral activation since last session.  Per patient's report she has implemented use of daily planning and reports this has been very helpful.  She is participating in pleasant activities such as transfer planning flowers, cooking, and going fishing.  She also contacted the owner of a local daycare and plans to start volunteering a couple of hours a couple of days a week doing activities with young children such as reading and crafts.  Patient reports very much looking forward to this.  Patient also reports recently enjoying attending her oldest grandchild's high school graduation.  She reports recently enjoying going out to dinner with her ex mother-in-law.  They have redefined their relationship and patient reports enjoying the relationship very much.  She is planning day trips and hopes one of her friends can go with her but says she will go alone and enjoy the day if friend can't attend.  Patient states she now is looking at things differently.  She states knowing she is sick but deciding to live life to the best of her ability and to consider each day a gift.  She expresses sadness her oncologist is leaving the practice in August but expresses confidence in having continued good care with whomever is his replacement.   Suicidal/Homicidal: Nowithout intent/plan  Therapist Response: Reviewed symptoms, praised and reinforced pt's increased behavioral activation and socialization, praised and reinforced patient's use of daily planning, discussed effects on her mood/thoughts/behavior, developed  a plan with patient to continue using daily planning consistently   Diagnosis: Adjustment disorder with mixed anxiety and depressed mood      Collaboration of Care: Primary Care Provider AEB patient is seeing PCP  Dr. Alberteen Huge for medication management  Patient/Guardian was advised Release of Information must be obtained prior to any record release in order to collaborate their care with an outside provider. Patient/Guardian was advised if they have not already done so to contact the registration department to sign all necessary forms in order for us  to release information regarding their care.   Consent: Patient/Guardian gives verbal consent for treatment and assignment of benefits for services provided during this visit. Patient/Guardian expressed understanding and agreed to proceed.   Dicie Foster, LCSW6/19/2025

## 2024-03-27 ENCOUNTER — Other Ambulatory Visit: Payer: Self-pay | Admitting: "Endocrinology

## 2024-03-27 DIAGNOSIS — E890 Postprocedural hypothyroidism: Secondary | ICD-10-CM

## 2024-03-31 ENCOUNTER — Encounter: Payer: Self-pay | Admitting: "Endocrinology

## 2024-03-31 ENCOUNTER — Ambulatory Visit (INDEPENDENT_AMBULATORY_CARE_PROVIDER_SITE_OTHER): Payer: 59 | Admitting: "Endocrinology

## 2024-03-31 VITALS — BP 124/64 | HR 80 | Ht 66.0 in | Wt 144.4 lb

## 2024-03-31 DIAGNOSIS — I10 Essential (primary) hypertension: Secondary | ICD-10-CM | POA: Diagnosis not present

## 2024-03-31 DIAGNOSIS — R748 Abnormal levels of other serum enzymes: Secondary | ICD-10-CM | POA: Insufficient documentation

## 2024-03-31 DIAGNOSIS — E890 Postprocedural hypothyroidism: Secondary | ICD-10-CM | POA: Diagnosis not present

## 2024-03-31 DIAGNOSIS — E119 Type 2 diabetes mellitus without complications: Secondary | ICD-10-CM

## 2024-03-31 DIAGNOSIS — E782 Mixed hyperlipidemia: Secondary | ICD-10-CM | POA: Diagnosis not present

## 2024-03-31 LAB — POCT GLYCOSYLATED HEMOGLOBIN (HGB A1C): HbA1c, POC (controlled diabetic range): 5.6 % (ref 0.0–7.0)

## 2024-03-31 NOTE — Progress Notes (Signed)
 03/31/2024, 9:46 AM  Endocrinology follow-up note   Deanna Bush is a 63 y.o.-year-old female patient being seen for follow-up after she was seen in consultation for hypothyroidism. PMD:  [name], MD.   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    Cellulitis of leg, left    Complication of anesthesia    Hard to wake up   Depression    Diabetes mellitus without complication (HCC)    Pt denies   Family history of breast cancer 08/16/2011   Family history of breast cancer    Family history of colon cancer    Family history of kidney cancer    Family history of prostate cancer    Hyperlipidemia    Hypertension    Hypothyroidism    MRSA (methicillin resistant staph aureus) culture positive 08/19/2011   Pre-diabetes     Past Surgical History:  Procedure Laterality Date   BREAST SURGERY Left    mastectomy   CATARACT EXTRACTION W/PHACO Right 06/06/2023   Procedure: CATARACT EXTRACTION PHACO AND INTRAOCULAR LENS PLACEMENT (IOC);  Surgeon: Harrie Agent, MD;  Location: [facility];  Service: Ophthalmology;  Laterality: Right;  CDE 5.61   CATARACT EXTRACTION W/PHACO Left 07/21/2023   Procedure: CATARACT EXTRACTION PHACO AND INTRAOCULAR LENS PLACEMENT (IOC);  Surgeon: Harrie Agent, MD;  Location: [facility];  Service: Ophthalmology;  Laterality: Left;  CDE: 5.96   CESAREAN SECTION     x2   COLONOSCOPY N/A 03/03/2020   Procedure: COLONOSCOPY;  Surgeon: Shaaron Lamar CHRISTELLA, MD;  Location: [facility];  Service: Endoscopy;  Laterality: N/A;  12:00   IR IMAGING GUIDED PORT INSERTION  11/07/2021   left mastectomy     NEPHRECTOMY Right 02/06/2021   Procedure: NEPHRECTOMY- open radical;  Surgeon: Sherrilee Belvie CROME, MD;  Location: [facility];  Service: Urology;  Laterality: Right;   POLYPECTOMY  03/03/2020   Procedure: POLYPECTOMY;  Surgeon: Shaaron Lamar CHRISTELLA, MD;  Location: [facility];  Service: Endoscopy;;    Social History   Socioeconomic History   Marital status: Married    Spouse name: Not on file   Number of children: 2   Years of education: Not on file   Highest education level: GED or equivalent  Occupational History   Occupation: CNA  Tobacco Use   Smoking status: Former    Current packs/day: 0.00    Average packs/day: 0.5 packs/day for 18.0 years (9.0 ttl pk-yrs)    Types: Cigarettes    Start date: 07/01/1984    Quit date: 07/01/2002    Years since quitting: 21.7   Smokeless tobacco: Never  Vaping Use   Vaping status: Never Used  Substance and Sexual Activity   Alcohol use: No   Drug use: No   Sexual activity: Yes    Birth control/protection: None  Other Topics Concern   Not on file  Social History Narrative   Not on file   Social Drivers of Health   Financial Resource Strain: Medium Risk (08/14/2023)  Overall Financial Resource Strain (CARDIA)    Difficulty of Paying Living Expenses: Somewhat hard  Food Insecurity: No Food Insecurity (12/08/2023)   Hunger Vital Sign    Worried About Running Out of Food in the Last Year: Never true    Ran Out of Food in the Last Year: Never true  Transportation Needs: No Transportation Needs (12/08/2023)   PRAPARE - Administrator, Civil Service (Medical): No    Lack of Transportation (Non-Medical): No  Physical Activity: Insufficiently Active (12/08/2023)   Exercise Vital Sign    Days of Exercise per Week: 3 days    Minutes of Exercise per Session: 20 min  Stress: No Stress Concern Present (12/08/2023)   [employer] of Occupational Health - Occupational Stress Questionnaire    Feeling of Stress : Only a little  Social Connections: Moderately Integrated (12/08/2023)   Social Connection and Isolation Panel    Frequency of Communication with Friends and Family: More than three times a week    Frequency of Social Gatherings with Friends and Family: More than three times a week    Attends  Religious Services: More than 4 times per year    Active Member of [employer] or Organizations: Yes    Attends Engineer, structural: More than 4 times per year    Marital Status: Separated    Family History  Problem Relation Age of Onset   Alcohol abuse Mother    Hypertension Mother    Diabetes Mother    Hyperlipidemia Mother    Colon cancer Maternal Aunt        dx 96s   Prostate cancer Maternal Uncle    Throat cancer Maternal Uncle    Kidney cancer Maternal Grandfather    Lung cancer Half-Sister    Kidney cancer Half-Sister 41   Breast cancer Half-Sister 68   Breast cancer Half-Sister 62    Outpatient Encounter Medications as of 03/31/2024  Medication Sig   acetaminophen  (TYLENOL ) 500 MG tablet Take 500 mg by mouth every 6 (six) hours as needed for moderate pain or headache.   atorvastatin  (LIPITOR) 40 MG tablet Take 1 tablet (40 mg total) by mouth daily.   cabozantinib (CABOMETYX ) 40 MG tablet Take 1 tablet (40 mg total) by mouth daily. Take on an empty stomach, 1 hour before or 2 hours after meals.   EPINEPHrine  0.3 mg/0.3 mL IJ SOAJ injection Inject 0.3 mg into the muscle as needed for anaphylaxis.   furosemide  (LASIX ) 20 MG tablet TAKE 1 TABLET(20 MG) BY MOUTH DAILY AS NEEDED   HYDROcodone -acetaminophen  (NORCO) 5-325 MG tablet Take 1 tablet by mouth every 6 (six) hours as needed for moderate pain (pain score 4-6).   levothyroxine  (SYNTHROID ) 125 MCG tablet TAKE 1 TABLET(125 MCG) BY MOUTH DAILY BEFORE BREAKFAST   magnesium  oxide (MAG-OX) 400 (240 Mg) MG tablet Take 1 tablet (400 mg total) by mouth daily.   Multiple Vitamin (MULTIVITAMIN WITH MINERALS) TABS tablet Take 1 tablet by mouth daily.   olmesartan -hydrochlorothiazide (BENICAR  HCT) 40-25 MG tablet TAKE 1 TABLET BY MOUTH DAILY   UNABLE TO FIND 6 mastectomy prosthesis and bras.   urea  (CARMOL) 10 % cream Apply topically 2 (two) times daily. Apply twice daily to hands   Facility-Administered Encounter Medications as  of 03/31/2024  Medication   heparin  lock flush 100 unit/mL    ALLERGIES: Allergies  Allergen Reactions   Amlodipine  Swelling    Leg edema   Sulfonamide Derivatives Itching   Yellow Jacket Venom [Bee  Venom] Rash    urticaria   VACCINATION STATUS: Immunization History  Administered Date(s) Administered   Influenza Split 08/06/2012   Influenza, Seasonal, Injecte, Preservative Fre 08/14/2023   Influenza,inj,Quad PF,6+ Mos 07/22/2018, 06/28/2019, 06/28/2020, 07/10/2021   Influenza-Unspecified 07/07/2022   Moderna Sars-Covid-2 Vaccination 12/07/2019, 01/07/2020, 08/09/2020, 04/05/2021   Pneumococcal Conjugate-13 03/29/2015   Pneumococcal Polysaccharide-23 04/18/2021   Tdap 03/12/2011, 07/12/2021   Zoster Recombinant(Shingrix ) 05/05/2018, 07/02/2018     HPI    JAN WALTERS  is a patient with the above medical history.  Her history starts at approximate age of 12 when she had Graves' disease which required RAI thyroid  ablation.  She is currently on levothyroxine  125 mcg p.o. daily before breakfast.   Her previsit thyroid  function test consistent with progressive improvement with free T4 on target at 1.75 TSH improving to 8.8 from a high of 93. She is consistent in taking her Lipitor 40 mg p.o. nightly unfortunately hyperlipidemia which also showed significant improvement.     She feels better, presents with steady weight.  -She recently underwent right nephrectomy for renal cell cell carcinoma.  She has metastatic malignancy to the liver.   She denies fatigue, palpitations, tremors, heat/cold intolerance.  -She denies dysphagia, shortness of breath, no voice change.  She underwent thyroid  ultrasound recently which did not show significant goiter , nor any discrete thyroid  nodules.    she has family history of  thyroid  disorders in her mother, but no family history of thyroid  cancer.  -He is a former smoker.   Her medical history also includes hyperlipidemia, type 2 diabetes.  She  is on lifestyle management for her diabetes.  Her point-of-care A1c remains stable at 5.6%, overall improving from 6.6%.     ROS:  Limited as above.   Physical Exam: BP 124/64   Pulse 80   Ht 5' 6 (1.676 m)   Wt 144 lb 6.4 oz (65.5 kg)   BMI 23.31 kg/m  Wt Readings from Last 3 Encounters:  03/31/24 144 lb 6.4 oz (65.5 kg)  03/23/24 145 lb 8.1 oz (66 kg)  02/10/24 144 lb 8 oz (65.5 kg)    Constitutional:  Body mass index is 23.31 kg/m., not in acute distress, normal state of mind   CMP ( most recent) CMP     Component Value Date/Time   NA 133 (L) 03/23/2024 1018   K 4.9 03/23/2024 1018   CL 96 03/23/2024 1018   CO2 22 03/23/2024 1018   GLUCOSE 79 03/23/2024 1018   GLUCOSE 92 03/16/2024 0953   BUN 28 (H) 03/23/2024 1018   CREATININE 1.13 (H) 03/23/2024 1018   CREATININE 0.73 04/22/2019 0848   CALCIUM  9.8 03/23/2024 1018   PROT 7.6 03/23/2024 1018   ALBUMIN 3.5 (L) 03/23/2024 1018   AST 41 (H) 03/23/2024 1018   ALT 26 03/23/2024 1018   ALKPHOS 252 (H) 03/23/2024 1018   BILITOT 0.3 03/23/2024 1018   GFRNONAA >60 03/16/2024 0953   GFRNONAA 90 04/22/2019 0848   GFRAA 96 10/30/2020 0914   GFRAA 104 04/22/2019 0848     Diabetic Labs (most recent): Lab Results  Component Value Date   HGBA1C 5.6 03/31/2024   HGBA1C 5.6 12/02/2023   HGBA1C 5.8 07/28/2023   MICROALBUR 0.2 10/23/2018   MICROALBUR <0.2 06/17/2017   MICROALBUR <3.0 (H) 04/03/2016     Lipid Panel ( most recent) Lipid Panel     Component Value Date/Time   CHOL 153 03/23/2024 1018   TRIG 64 03/23/2024 1018  HDL 47 03/23/2024 1018   CHOLHDL 3.3 03/23/2024 1018   CHOLHDL 2.7 10/23/2018 1148   VLDL 7 08/12/2016 0913   LDLCALC 93 03/23/2024 1018   LDLCALC 100 (H) 10/23/2018 1148   LABVLDL 13 03/23/2024 1018       Lab Results  Component Value Date   TSH 8.820 (H) 03/23/2024   TSH 29.357 (H) 12/29/2023   TSH 49.145 (H) 12/01/2023   TSH 72.400 (H) 11/24/2023   TSH 93.092 (H) 11/17/2023    TSH 36.100 (H) 07/21/2023   TSH 57.200 (H) 01/13/2023   TSH 24.400 (H) 06/27/2022   TSH 10.365 (H) 03/18/2022   TSH 6.639 (H) 02/21/2022   FREET4 1.75 03/23/2024   FREET4 1.08 11/24/2023   FREET4 1.13 07/21/2023   FREET4 0.94 01/13/2023   FREET4 1.01 06/27/2022   FREET4 1.25 02/28/2022   FREET4 1.44 08/28/2021   FREET4 1.30 09/04/2020   FREET4 1.1 03/28/2020   FREET4 0.7 (L) 01/03/2020       ASSESSMENT: 1. Hypothyroidism   2.  Type 2 diabetes  3.  Hyperlipidemia  4.  Hypertension 5.  Elevated alkaline phosphatase  PLAN:   I discussed her new and existing labs with her. -Her previsit thyroid  function tests are consistent with appropriate replacement.  She is advised to continue levothyroxine  125 mcg p.o. daily before breakfast.    - We discussed about the correct intake of her thyroid  hormone, on empty stomach at fasting, with water , separated by at least 30 minutes from breakfast and other medications,  and separated by more than 4 hours from calcium , iron , multivitamins, acid reflux medications (PPIs). -Patient is made aware of the fact that thyroid  hormone replacement is needed for life, dose to be adjusted by periodic monitoring of thyroid  function tests.    -Her recent thyroid  ultrasound was reviewed, no goiter, no discrete nodules. -She has controlled type 2 diabetes with point-of-care A1c remaining at 5.6% overall improving.  She will not need any medication intervention at this time.     She did not engage with the lifestyle medicine nutrition.  She is advised to stay on atorvastatin  40 mg p.o. daily which has helped her achieve good control on lipid panel with LDL at 93 improving from 213.    Side effects and precautions discussed with her.   - she acknowledges that there is a room for improvement in her food and drink choices. - Suggestion is made for her to avoid simple carbohydrates  from her diet including Cakes, Sweet Desserts, Ice Cream, Soda (diet and  regular), Sweet Tea, Candies, Chips, Cookies, Store Bought Juices, Alcohol , Artificial Sweeteners,  Coffee Creamer, and Sugar-free Products, Lemonade. This will help patient to have more stable blood glucose profile and potentially avoid unintended weight gain.   She promises to continue to do better on her diet and engaging with lifestyle medicine.  Her blood pressure is better controlled today at 124/64.  she is encouraged to be consistent and take her blood pressure medications every morning.   She has olmesartan -hydrochlorothiazide 40-25 mg p.o. daily.  Regarding elevated alkaline phosphatase: Likely multifactorial including cholestasis related to her liver metastasis, nonalcoholic fatty liver disease, etc. She will benefit from lipid control, however will order GGT, bone specific alkaline phosphatase  FIB-4 score before her next visit.  She had a previous bone scan in 2022 which did not show evidence of Paget's disease.  She would also benefit from repeat measurement of 25-hydroxy vitamin D .  She is advised to maintain  close follow-up with her PMD Dr. Rollene Pesa.  I spent  41  minutes in the care of the patient today including review of labs from Thyroid  Function, CMP, and other relevant labs ; imaging/biopsy records (current and previous including abstractions from other facilities); face-to-face time discussing  her lab results and symptoms, medications doses, her options of short and long term treatment based on the latest standards of care / guidelines;   and documenting the encounter.  Arland CHRISTELLA Mulch  participated in the discussions, expressed understanding, and voiced agreement with the above plans.  All questions were answered to her satisfaction. she is encouraged to contact clinic should she have any questions or concerns prior to her return visit.     Return in about 6 months (around 09/30/2024) for Fasting Labs  in AM B4 8, A1c -NV.  Ranny Earl, MD [facility]  [facility] [address] [place], [place] [address] Phone: [phone]  Fax: [phone]   03/31/2024, 9:46 AM  This note was partially dictated with voice recognition software. Similar sounding words can be transcribed inadequately or may not  be corrected upon review.

## 2024-03-31 NOTE — Patient Instructions (Signed)

## 2024-04-04 ENCOUNTER — Other Ambulatory Visit: Payer: Self-pay | Admitting: Hematology

## 2024-04-13 ENCOUNTER — Encounter: Payer: Self-pay | Admitting: Family Medicine

## 2024-04-13 ENCOUNTER — Ambulatory Visit: Admitting: Family Medicine

## 2024-04-13 VITALS — BP 105/62 | HR 83 | Resp 18 | Ht 66.0 in | Wt 146.1 lb

## 2024-04-13 DIAGNOSIS — F419 Anxiety disorder, unspecified: Secondary | ICD-10-CM | POA: Diagnosis not present

## 2024-04-13 DIAGNOSIS — I10 Essential (primary) hypertension: Secondary | ICD-10-CM

## 2024-04-13 DIAGNOSIS — E890 Postprocedural hypothyroidism: Secondary | ICD-10-CM | POA: Diagnosis not present

## 2024-04-13 DIAGNOSIS — E782 Mixed hyperlipidemia: Secondary | ICD-10-CM | POA: Diagnosis not present

## 2024-04-13 MED ORDER — OLMESARTAN MEDOXOMIL-HCTZ 20-12.5 MG PO TABS: 1.0000 | ORAL_TABLET | Freq: Every day | ORAL | 5 refills | Status: AC

## 2024-04-13 MED ORDER — BUSPIRONE HCL 5 MG PO TABS: ORAL_TABLET | ORAL | 3 refills | Status: AC

## 2024-04-13 MED ORDER — UNABLE TO FIND
237.0000 mL | Freq: Two times a day (BID) | 3 refills | Status: AC
Start: 2024-04-13 — End: ?

## 2024-04-13 NOTE — Assessment & Plan Note (Signed)
 Hyperlipidemia:Low fat diet discussed and encouraged.   Lipid Panel  Lab Results  Component Value Date   CHOL 153 03/23/2024   HDL 47 03/23/2024   LDLCALC 93 03/23/2024   TRIG 64 03/23/2024   CHOLHDL 3.3 03/23/2024     Controlled, no change in medication

## 2024-04-13 NOTE — Assessment & Plan Note (Signed)
 Feels anxiuous and irritated on avg 4 times per week, also reports poor sleep, wakes after about 4 hrs start twice daily as eneded Buspar  and continue therapy

## 2024-04-13 NOTE — Progress Notes (Signed)
 Deanna Bush     MRN: [id number]      DOB: September 18, 1961  Chief Complaint  Patient presents with   Hypertension    4 month follow up     HPI Deanna Bush is here for follow up and re-evaluation of chronic medical conditions, medication management and review of any available recent lab and radiology data.  Preventive health is updated, specifically  Cancer screening and Immunization.   Questions or concerns regarding consultations or procedures which the PT has had in the interim are  addressed. The PT notes that her bP is ofetne low and takes med on avg 3 days per week, still taking lasix  every other day  Appetite  fair , eats small amouts regularly, needs to inc ensure to 2 daily  Requests medication for anxiety as needed , as she realizes that at times she gets emotional and anxious Denies any pain  ROS Denies recent fever or chills. Denies sinus pressure, nasal congestion, ear pain or sore throat. Denies chest congestion, productive cough or wheezing. Denies chest pains, palpitations and leg swelling Denies abdominal pain, nausea, vomiting,diarrhea or constipation.   Denies dysuria, frequency, hesitancy or incontinence. Denies joint pain, swelling and limitation in mobility. Denies headaches, seizures, numbness, or tingling. Denies skin break down or rash.   PE  BP 105/62   Pulse 83   Resp 18   Ht 5' 6 (1.676 m)   Wt 146 lb 1.9 oz (66.3 kg)   SpO2 93%   BMI 23.58 kg/m   Patient alert and oriented and in no cardiopulmonary distress.  HEENT: No facial asymmetry, EOMI,     Neck supple .  Chest: Clear to auscultation bilaterally.  CVS: S1, S2 no murmurs, no S3.Regular rate.  ABD: Soft non tender.   Ext: No edema  MS: Adequate ROM spine, shoulders, hips and knees.  Skin: Intact, no ulcerations or rash noted.  Psych: Good eye contact, normal affect. Memory intact not anxious or depressed appearing.  CNS: CN 2-12 intact, power,  normal throughout.no focal deficits  noted.   Assessment & Plan  Essential hypertension, benign Over corrected, reduiuce olmesartan /hydrochlorothiazide to 20/12.5 and hold for SZBP under 130 DASH diet and commitment to daily physical activity for a minimum of 30 minutes discussed and encouraged, as a part of hypertension management. The importance of attaining a healthy weight is also discussed.     04/13/2024    8:02 AM 03/31/2024    9:21 AM 03/23/2024   10:41 AM 02/10/2024   10:55 AM 02/02/2024    8:20 AM 12/29/2023   10:34 AM 12/12/2023    8:12 AM  BP/Weight  Systolic BP 105 124 119 123 133 148 149  Diastolic BP 62 64 63 66 72 78 78  Wt. (Lbs) 146.12 144.4 145.5 144.5 143.6 147.05 148  BMI 23.58 kg/m2 23.31 kg/m2 23.48 kg/m2 23.32 kg/m2 23.18 kg/m2 23.73 kg/m2 23.89 kg/m2       Hypothyroidism following radioiodine therapy Improved, managed by Endo, now has resumed taking meds as prescribed  Mixed hyperlipidemia Hyperlipidemia:Low fat diet discussed and encouraged.   Lipid Panel  Lab Results  Component Value Date   CHOL 153 03/23/2024   HDL 47 03/23/2024   LDLCALC 93 03/23/2024   TRIG 64 03/23/2024   CHOLHDL 3.3 03/23/2024     Controlled, no change in medication   Anxiety Feels anxiuous and irritated on avg 4 times per week, also reports poor sleep, wakes after about 4 hrs start twice daily  as eneded Buspar  and continue therapy

## 2024-04-13 NOTE — Assessment & Plan Note (Signed)
 Over corrected, reduiuce olmesartan /hydrochlorothiazide to 20/12.5 and hold for SZBP under 130 DASH diet and commitment to daily physical activity for a minimum of 30 minutes discussed and encouraged, as a part of hypertension management. The importance of attaining a healthy weight is also discussed.     04/13/2024    8:02 AM 03/31/2024    9:21 AM 03/23/2024   10:41 AM 02/10/2024   10:55 AM 02/02/2024    8:20 AM 12/29/2023   10:34 AM 12/12/2023    8:12 AM  BP/Weight  Systolic BP 105 124 119 123 133 148 149  Diastolic BP 62 64 63 66 72 78 78  Wt. (Lbs) 146.12 144.4 145.5 144.5 143.6 147.05 148  BMI 23.58 kg/m2 23.31 kg/m2 23.48 kg/m2 23.32 kg/m2 23.18 kg/m2 23.73 kg/m2 23.89 kg/m2

## 2024-04-13 NOTE — Patient Instructions (Signed)
 F/U in 3 months,  call if you need me sooner  Nurse pls print rx for ensure plus 2 daily dx is malnutrition, renal cell cancer   Reduced dose of BP med to 20/12.5 mg daily as needed, for BP, continue daily BP cgheck and as you are doing hold tablet if sBP is under 130    New for anxiety and sleep is buspar , take one at bedtime, the  1./t to one`daily as needed, for anxiety  Keep active as able And no changes in your good  habits  Thanks for choosing [facility], we consider it a privelige to serve you.

## 2024-04-13 NOTE — Assessment & Plan Note (Signed)
 Improved, managed by Endo, now has resumed taking meds as prescribed

## 2024-04-14 ENCOUNTER — Ambulatory Visit (HOSPITAL_COMMUNITY): Payer: Self-pay | Admitting: Psychiatry

## 2024-04-22 ENCOUNTER — Other Ambulatory Visit: Payer: Self-pay | Admitting: *Deleted

## 2024-04-22 ENCOUNTER — Other Ambulatory Visit (HOSPITAL_COMMUNITY)

## 2024-04-22 ENCOUNTER — Inpatient Hospital Stay: Attending: Hematology

## 2024-04-22 ENCOUNTER — Ambulatory Visit (HOSPITAL_COMMUNITY)
Admission: RE | Admit: 2024-04-22 | Discharge: 2024-04-22 | Disposition: A | Discharge status: Home / Self Care | Source: Ambulatory Visit | Attending: Hematology | Admitting: Hematology

## 2024-04-22 VITALS — BP 134/71 | HR 91 | Resp 18 | Wt 147.9 lb

## 2024-04-22 DIAGNOSIS — Z853 Personal history of malignant neoplasm of breast: Secondary | ICD-10-CM | POA: Diagnosis not present

## 2024-04-22 DIAGNOSIS — C641 Malignant neoplasm of right kidney, except renal pelvis: Secondary | ICD-10-CM | POA: Insufficient documentation

## 2024-04-22 DIAGNOSIS — C787 Secondary malignant neoplasm of liver and intrahepatic bile duct: Secondary | ICD-10-CM | POA: Diagnosis not present

## 2024-04-22 DIAGNOSIS — Z905 Acquired absence of kidney: Secondary | ICD-10-CM | POA: Diagnosis not present

## 2024-04-22 DIAGNOSIS — R918 Other nonspecific abnormal finding of lung field: Secondary | ICD-10-CM | POA: Diagnosis not present

## 2024-04-22 DIAGNOSIS — E782 Mixed hyperlipidemia: Secondary | ICD-10-CM

## 2024-04-22 DIAGNOSIS — I700 Atherosclerosis of aorta: Secondary | ICD-10-CM | POA: Insufficient documentation

## 2024-04-22 DIAGNOSIS — R599 Enlarged lymph nodes, unspecified: Secondary | ICD-10-CM | POA: Insufficient documentation

## 2024-04-22 DIAGNOSIS — R188 Other ascites: Secondary | ICD-10-CM | POA: Diagnosis not present

## 2024-04-22 LAB — CBC WITH DIFFERENTIAL/PLATELET
Abs Immature Granulocytes: 0.02 K/uL (ref 0.00–0.07)
Basophils Absolute: 0 K/uL (ref 0.0–0.1)
Basophils Relative: 0 %
Eosinophils Absolute: 0 K/uL (ref 0.0–0.5)
Eosinophils Relative: 1 %
HCT: 27.4 % — ABNORMAL LOW (ref 36.0–46.0)
Hemoglobin: 8.6 g/dL — ABNORMAL LOW (ref 12.0–15.0)
Immature Granulocytes: 0 %
Lymphocytes Relative: 23 %
Lymphs Abs: 1.3 K/uL (ref 0.7–4.0)
MCH: 32.1 pg (ref 26.0–34.0)
MCHC: 31.4 g/dL (ref 30.0–36.0)
MCV: 102.2 fL — ABNORMAL HIGH (ref 80.0–100.0)
Monocytes Absolute: 0.6 K/uL (ref 0.1–1.0)
Monocytes Relative: 10 %
Neutro Abs: 3.8 K/uL (ref 1.7–7.7)
Neutrophils Relative %: 66 %
Platelets: 331 K/uL (ref 150–400)
RBC: 2.68 MIL/uL — ABNORMAL LOW (ref 3.87–5.11)
RDW: 14.1 % (ref 11.5–15.5)
WBC: 5.7 K/uL (ref 4.0–10.5)
nRBC: 0 % (ref 0.0–0.2)

## 2024-04-22 LAB — COMPREHENSIVE METABOLIC PANEL WITH GFR
ALT: 19 U/L (ref 0–44)
AST: 30 U/L (ref 15–41)
Albumin: 2.4 g/dL — ABNORMAL LOW (ref 3.5–5.0)
Alkaline Phosphatase: 139 U/L — ABNORMAL HIGH (ref 38–126)
Anion gap: 12 (ref 5–15)
BUN: 18 mg/dL (ref 8–23)
CO2: 22 mmol/L (ref 22–32)
Calcium: 9 mg/dL (ref 8.9–10.3)
Chloride: 100 mmol/L (ref 98–111)
Creatinine, Ser: 0.89 mg/dL (ref 0.44–1.00)
GFR, Estimated: >60 mL/min (ref >60)
Glucose, Bld: 82 mg/dL (ref 70–99)
Potassium: 3.9 mmol/L (ref 3.5–5.1)
Sodium: 134 mmol/L — ABNORMAL LOW (ref 135–145)
Total Bilirubin: 0.4 mg/dL (ref 0.0–1.2)
Total Protein: 8.1 g/dL (ref 6.5–8.1)

## 2024-04-22 LAB — MAGNESIUM: Magnesium: 1.6 mg/dL — ABNORMAL LOW (ref 1.7–2.4)

## 2024-04-22 MED ORDER — IOHEXOL 300 MG/ML  SOLN
100.0000 mL | Freq: Once | INTRAMUSCULAR | Status: AC | PRN
  Administered 2024-04-22: 100 mL via INTRAVENOUS

## 2024-04-22 MED ORDER — CABOMETYX 40 MG PO TABS: 40.0000 mg | ORAL_TABLET | Freq: Every day | ORAL | 2 refills | Status: DC

## 2024-04-22 MED ORDER — SODIUM CHLORIDE 0.9% FLUSH
10.0000 mL | INTRAVENOUS | Status: DC | PRN
  Administered 2024-04-22: 10 mL via INTRAVENOUS

## 2024-04-22 MED ORDER — HEPARIN SOD (PORK) LOCK FLUSH 100 UNIT/ML IV SOLN
INTRAVENOUS | Status: AC
  Filled 2024-04-22: qty 5

## 2024-04-22 MED ORDER — HEPARIN SOD (PORK) LOCK FLUSH 100 UNIT/ML IV SOLN
500.0000 [IU] | Freq: Once | INTRAVENOUS | Status: AC
  Administered 2024-04-22: 500 [IU] via INTRAVENOUS

## 2024-04-22 NOTE — Telephone Encounter (Signed)
 Cabozanitinib refill approved.  Patient tolerating and is to continue therapy at this time.

## 2024-04-22 NOTE — Progress Notes (Addendum)
 Deanna Bush presented for Portacath access and flush with labs.  Portacath located right chest wall accessed with  H 20 needle.  Good blood return present. Patient remains accessed for CT scan. Procedure tolerated well and without incident.     Discharged from clinic ambulatory in stable condition. Alert and oriented x 3. F/U with [facility] as scheduled.

## 2024-04-23 ENCOUNTER — Other Ambulatory Visit: Payer: Self-pay | Admitting: Family Medicine

## 2024-04-26 ENCOUNTER — Other Ambulatory Visit: Payer: Self-pay | Admitting: *Deleted

## 2024-04-26 DIAGNOSIS — C641 Malignant neoplasm of right kidney, except renal pelvis: Secondary | ICD-10-CM

## 2024-04-26 MED ORDER — CABOMETYX 40 MG PO TABS: 40.0000 mg | ORAL_TABLET | Freq: Every day | ORAL | 2 refills | Status: DC

## 2024-04-27 ENCOUNTER — Other Ambulatory Visit: Payer: Self-pay | Admitting: *Deleted

## 2024-04-27 DIAGNOSIS — C641 Malignant neoplasm of right kidney, except renal pelvis: Secondary | ICD-10-CM

## 2024-04-27 MED ORDER — CABOMETYX 40 MG PO TABS: 40.0000 mg | ORAL_TABLET | Freq: Every day | ORAL | 2 refills | Status: DC

## 2024-04-28 ENCOUNTER — Ambulatory Visit (INDEPENDENT_AMBULATORY_CARE_PROVIDER_SITE_OTHER): Payer: Self-pay | Admitting: Psychiatry

## 2024-04-28 DIAGNOSIS — F4323 Adjustment disorder with mixed anxiety and depressed mood: Secondary | ICD-10-CM | POA: Diagnosis not present

## 2024-04-28 NOTE — Progress Notes (Signed)
 Virtual Visit via Telephone Note  I connected with Deanna Bush on 04/28/24 at 2:10 PM by telephone and verified that I am speaking with the correct person using two identifiers.  Location: Patient: Home Provider: [facility] Outpatient [place] office    I discussed the limitations, risks, security and privacy concerns of performing an evaluation and management service by telephone and the availability of in person appointments. I also discussed with the patient that there may be a patient responsible charge related to this service. The patient expressed understanding and agreed to proceed.    I provided 41 minutes of non-face-to-face time during this encounter.   Winton FORBES Rubinstein, LCSW   IN-PERSON  THERAPIST PROGRESS NOTE  Session Time:  Wednesday  04/28/2024 2:10 PM -  2:51 PM   Participation Level: Active  Behavioral Response: CasualAlert/anxious   Type of Therapy: Individual Therapy  Treatment Goals addressed: Have healthy grieving process around loss Begin verbalizing feelings associated with loss     ProgressTowards Goals: Progressing  Interventions: Supportive/CBT  Summary: Deanna Bush is a 63 y.o. female who is referred for services by PCP Dr. Antonetta due to pt experiencing symptoms of anxiety along with grief and loss issues. Pt denies any psychiatric hospitalizations. Pt has no previous involvement in outpatient therapy.  Per patient's report her stepdaughter was diagnosed with colon cancer at age 40 and died in 11-20-22.  Patient was her caretaker and says patient died at her home.  Patient reports her husband left her about 2 weeks ago for 81 year old after 30 years of marriage.  Patient reports additional stress regarding providing care for her 41 year old mother.  Patient also has her own health issues as she was diagnosed with kidney cancer 2 years ago and had chemo.  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 She currently is taking  experimental drugs.  Patient reports crying spells, sadness, irritability, worry, anxiety, and sleeping difficulty.   Patient last was seen about 4 weeks ago.  She reports decreased energy since last session.  However, she has maintained involvement in activities within her capability.  She is pacing self and sometimes has to sit to do tasks when she normally would stand.  She reports increased thoughts about her oncologist leaving.  She expresses gratitude for his care and sadness regarding his leaving.  She expresses appropriate concerns regarding his replacement but remains hopeful about working with a different provider.  She reports seeing PCP a couple of weeks ago and asking for medication to help cope with anxiety.  However patient reports she has not yet taken the medication.  She reports sometimes becoming anxious when she thinks about her situation.  She reports accepting she will die and being at peace with this.  However, she worries what may happen before her death.  She fears that she may not be able to take care of self and ADLs.  She also fears being in pain.  Suicidal/Homicidal: Nowithout intent/plan  Therapist Response: Reviewed symptoms, praised and reinforced pt's continued behavioral activation and socialization along with her efforts to adjust according to her energy level, discussed effects, facilitated patient expressing thoughts and feelings regarding her condition, validated feelings, assisted patient identify her worry thoughts, assisted patient examine her fears, assisted patient identify recent previous experience where she was unable to take care of self and ADLs/experienced severe pain, assisted patient identify how she coped with these experiences, discussed how she could use previous strategies should  her fear come true, discussed approaching fears rather than avoiding or dwelling on fears, developed plan with patient to continue using daily planning    Diagnosis:  Adjustment disorder with mixed anxiety and depressed mood      Collaboration of Care: Primary Care Provider AEB patient is seeing PCP Dr. Rollene Pesa for medication management  Patient/Guardian was advised Release of Information must be obtained prior to any record release in order to collaborate their care with an outside provider. Patient/Guardian was advised if they have not already done so to contact the registration department to sign all necessary forms in order for us  to release information regarding their care.   Consent: Patient/Guardian gives verbal consent for treatment and assignment of benefits for services provided during this visit. Patient/Guardian expressed understanding and agreed to proceed.   Mattalynn Crandle FORBES Rubinstein, LCSW7/23/2025

## 2024-05-11 ENCOUNTER — Inpatient Hospital Stay: Attending: Hematology | Admitting: Hematology

## 2024-05-11 ENCOUNTER — Other Ambulatory Visit: Payer: Self-pay | Admitting: Pharmacy Technician

## 2024-05-11 ENCOUNTER — Other Ambulatory Visit (HOSPITAL_COMMUNITY): Payer: Self-pay

## 2024-05-11 ENCOUNTER — Telehealth: Payer: Self-pay | Admitting: Pharmacy Technician

## 2024-05-11 ENCOUNTER — Other Ambulatory Visit: Payer: Self-pay

## 2024-05-11 ENCOUNTER — Telehealth: Payer: Self-pay | Admitting: Pharmacist

## 2024-05-11 VITALS — BP 130/71 | HR 90 | Temp 98.3°F | Resp 18 | Wt 146.8 lb

## 2024-05-11 DIAGNOSIS — C787 Secondary malignant neoplasm of liver and intrahepatic bile duct: Secondary | ICD-10-CM | POA: Insufficient documentation

## 2024-05-11 DIAGNOSIS — Z8051 Family history of malignant neoplasm of kidney: Secondary | ICD-10-CM | POA: Diagnosis not present

## 2024-05-11 DIAGNOSIS — E039 Hypothyroidism, unspecified: Secondary | ICD-10-CM | POA: Insufficient documentation

## 2024-05-11 DIAGNOSIS — C641 Malignant neoplasm of right kidney, except renal pelvis: Secondary | ICD-10-CM | POA: Diagnosis not present

## 2024-05-11 DIAGNOSIS — Z9012 Acquired absence of left breast and nipple: Secondary | ICD-10-CM | POA: Insufficient documentation

## 2024-05-11 DIAGNOSIS — Z8042 Family history of malignant neoplasm of prostate: Secondary | ICD-10-CM | POA: Insufficient documentation

## 2024-05-11 DIAGNOSIS — Z9221 Personal history of antineoplastic chemotherapy: Secondary | ICD-10-CM | POA: Insufficient documentation

## 2024-05-11 DIAGNOSIS — Z803 Family history of malignant neoplasm of breast: Secondary | ICD-10-CM | POA: Diagnosis not present

## 2024-05-11 DIAGNOSIS — R609 Edema, unspecified: Secondary | ICD-10-CM | POA: Insufficient documentation

## 2024-05-11 DIAGNOSIS — Z87891 Personal history of nicotine dependence: Secondary | ICD-10-CM | POA: Diagnosis not present

## 2024-05-11 DIAGNOSIS — Z801 Family history of malignant neoplasm of trachea, bronchus and lung: Secondary | ICD-10-CM | POA: Insufficient documentation

## 2024-05-11 DIAGNOSIS — Z79899 Other long term (current) drug therapy: Secondary | ICD-10-CM | POA: Diagnosis not present

## 2024-05-11 DIAGNOSIS — I10 Essential (primary) hypertension: Secondary | ICD-10-CM | POA: Diagnosis not present

## 2024-05-11 DIAGNOSIS — D649 Anemia, unspecified: Secondary | ICD-10-CM | POA: Insufficient documentation

## 2024-05-11 DIAGNOSIS — Z905 Acquired absence of kidney: Secondary | ICD-10-CM | POA: Diagnosis not present

## 2024-05-11 DIAGNOSIS — Z853 Personal history of malignant neoplasm of breast: Secondary | ICD-10-CM | POA: Diagnosis not present

## 2024-05-11 DIAGNOSIS — Z800 Family history of malignant neoplasm of digestive organs: Secondary | ICD-10-CM | POA: Insufficient documentation

## 2024-05-11 MED ORDER — TIVOZANIB HCL 1.34 MG PO CAPS
1.3400 mg | ORAL_CAPSULE | Freq: Every day | ORAL | 3 refills | Status: DC
  Filled 2024-05-11: qty 21, 28d supply, fill #0
  Filled 2024-06-01: qty 21, 28d supply, fill #1
  Filled 2024-07-01: qty 21, 28d supply, fill #2
  Filled 2024-07-28 – 2024-07-30 (×2): qty 21, 28d supply, fill #3

## 2024-05-11 NOTE — Telephone Encounter (Signed)
 Clinical Pharmacist Practitioner Encounter   [facility] (Special[facility]) will deliver medication to patient on 05/13/24. Patient knows not to start until 05/14/24.  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   Counseled patient on administration, dosing, side effects, monitoring, drug-food interactions, safe handling, storage, and disposal. Patient will take 1 capsule (1.34 mg total) by mouth daily. Take for 21 days on, then off for 7 days. Repeat every 28 days.   Side effects include but not limited to: hypertension, diarrhea, nausea, fatigue.   Diarrhea: patient knows to use loperamide as needed and call the office if they are having four or more loose stool per day Hypertension: Reviewed s/sx of hypertension, patient reports having a blood pressure at home to monitor blood pressure  Reviewed with patient importance of keeping a medication schedule and plan for any missed doses.  After discussion with patient no patient barriers to medication adherence identified.   Distress evaluation: Distress thermometer not completed during telephone call as patient has been on previous lines of therapy.   Deanna Bush voiced understanding and appreciation. All questions answered. Medication handout provided.  Provided patient with Oral Chemotherapy [facility] phone number. Patient knows to call the office with questions or concerns. Oral Chemotherapy Navigation Clinic will continue to follow.  Deanna Bush, PharmD, BCOP, CPP Hematology/Oncology Clinical Pharmacist [facility] Oral [facility]otherapy [facility] [phone]  05/11/2024 2:48 PM

## 2024-05-11 NOTE — Telephone Encounter (Signed)
 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  CMP from 04/22/24 assessed, no relevant lab abnormalities. BP on 05/11/24 well controlled, continue to monitor. Repeat TSH ordered for patient. Messaged MD about monitoring for proteinuria.  Prescription dose and frequency assessed.   Current medication list in Epic reviewed, no DDIs with tivozanib identified.  Evaluated chart and no patient barriers to medication adherence identified.   Prescription has been e-scribed to the [facility] for benefits analysis and approval.  Oral Oncology Clinic will continue to follow for insurance authorization, copayment issues, initial counseling and start date.   Birdia Jaycox N. Abbigaile Rockman, PharmD, BCOP, CPP Hematology/Oncology Clinical Pharmacist [facility] Oral Chemotherapy Navigation [facility] [phone]  05/11/2024 1:35 PM

## 2024-05-11 NOTE — Progress Notes (Signed)
 [facility] [address] [place] [address]    Clinic Day:  05/11/2024  Referring physician: [name], MD  Patient Care Team: Deanna Rollene BRAVO, MD as PCP - General Mallipeddi, Deanna SQUIBB, MD as PCP - Cardiology (Cardiology) Deanna Hai, MD as Medical Oncologist (Medical Oncology)   ASSESSMENT & PLAN:   Assessment: 1. Metastatic clear-cell renal cell carcinoma: - Right radical nephrectomy on 02/06/2021. - Pathology shows clear-cell RCC, grade 4, 12.5 cm, necrosis and rhabdoid features are identified.  Tumor extends into the and invades the wall of the vena cava (pT3c).  Vascular, ureteral and all margins of resection are negative for tumor. - CT scan abdomen with and without contrast on 05/29/2021 showed several nodules along the peritoneal surface of the right nephrectomy bed warranting close attention. - CTAP with and without contrast on 09/18/2021 showed progressive nodularity within the right nephrectomy bed, with enlarged nodules adjacent to the right hepatic lobe extending inferiorly along the right retroperitoneum with multiple enlarged enhancing nodules.  Direct metastatic invasion into the right hepatic lobe.  Small nodule at the right lung base favored to be benign. - IMDC: Intermediate risk group with 2 features including diagnosis less than 1 year and hemoglobin lower than normal. - Bone scan on 10/05/2021 with focal activity in the mental vertex of the mandible which could be inflammatory/traumatic/metastatic. - CT chest on 10/03/2021 showed several lung nodules scattered bilaterally some of which could be metastatic and many others could be due to mucoid impaction. - NGS testing did not show any targetable mutations.  TMB was low.  CCND3 amplified.  PBRM1 VUS present. - Right nephrectomy bed biopsy (10/22/2021): Clear-cell renal cell carcinoma with necrosis - 4 cycles of ipilimumab  and nivolumab  from 11/19/2021 through 01/24/2022 - CT CAP  on 02/14/2022: Numerous new and enlarged lung nodules.  Significant interval increase in the mass in the right renal bed.  New hypodense liver lesions. - Lenvatinib  (12 mg) and everolimus  5 mg daily started on 03/13/2022, discontinued on 07/22/2023 due to progression. - Belzutifan  120 mg daily started on 07/29/2023, discontinued due to progression. - Cabozantinib 40 mg daily started on 10/20/2023, discontinued on 05/11/2024 due to progression. - Tivozanib 1.34 mg 3 weeks on/1 week of started on   2. Social/family history: - She lives at home with her husband and also takes care of her mother. - She works as a Child psychotherapist at [employer].  She quit smoking in 2004 and smoked half pack per day for 10 years. - Half sister (same mother) had kidney cancer at age 27.  Maternal grandfather had kidney cancer.  Sister died of lung cancer.  2 half sisters (same father) had breast cancers.  3.  Left breast stage I tubular adenocarcinoma: - She underwent left mastectomy on 06/11/2002.  0/6 lymph nodes positive.  ER 90%, PR 92%, Ki-67 6%, HER2 negative.  As per chart, declined antiestrogen therapy.    Plan: 1. Metastatic clear-cell RCC, intermediate risk by IMDC: - She is tolerating cabozantinib very well.  Denies any new onset pains. - We reviewed labs from 04/22/2024: Normal LFTs.  Albumin 2.4.  Hemoglobin 8.6 from myelosuppression. - Reviewed images of CT CAP from 04/22/2024: Mild progression of her metastatic disease in almost all of the places.  She does have a 1.6 x 1.7 cm mass in the pericardial sac anterior to the right ventricular apex. - She does not have any cardiac symptoms at this time.  I have discussed  this with radiation oncology.  Will make a referral. - We talked about switching therapy to Tivozanib.  We discussed common side effects including hypotension, GI toxicity, rare chance of GI perforation, thromboembolic events, thyroid  dysfunction, wound healing complications.  She will start  1.34 mg daily 3 weeks on/1 week off. - Will send prescription to our outpatient pharmacy.  RTC 4 weeks for follow-up with repeat labs.  2.  Normocytic anemia: - Hemoglobin is 8.6 from myelosuppression from cabozantinib.  No need for transfusion.  3.  Hypothyroidism: - Continue Synthroid  125 mcg daily.   4.  Hypertension: - She is checking blood pressure at home daily.  She is taking olmesartan /HCTZ if needed.  Blood pressure today is 130/70.   5.  Right loin pain: - She does not have any pain.  She has hydrocodone  if needed.   6.  Fluid retention: - Continue Lasix  20 mg every other day as needed.   7.  Nutrition: - She is eating small portions.  Continue Ensure 1 can daily.  Weight is stable.    Orders Placed This Encounter  Procedures   CBC with Differential/Platelet    Standing Status:   Future    Expected Date:   06/08/2024    Expiration Date:   09/06/2024    Release to patient:   Immediate   Comprehensive metabolic panel with GFR    Standing Status:   Future    Expected Date:   06/08/2024    Expiration Date:   09/06/2024    Release to patient:   Immediate   Magnesium     Standing Status:   Future    Expected Date:   06/08/2024    Expiration Date:   09/06/2024    Release to patient:   Immediate   TSH    Standing Status:   Future    Expected Date:   06/08/2024    Expiration Date:   09/06/2024    Release to patient:   Immediate   Vitamin B12    Standing Status:   Future    Expected Date:   06/08/2024    Expiration Date:   09/06/2024   Folate    Standing Status:   Future    Expected Date:   06/08/2024    Expiration Date:   09/06/2024   Sample to Blood Bank(Blood Bank Hold)    Standing Status:   Future    Expected Date:   06/08/2024    Expiration Date:   09/06/2024      Deanna Bush,acting as a scribe for Deanna Stands, MD.,have documented all relevant documentation on the behalf of Deanna Stands, MD,as directed by  Deanna Stands, MD while in the presence  of Deanna Stands, MD.  I, Deanna Stands MD, have reviewed the above documentation for accuracy and completeness, and I agree with the above.      Deanna Stands, MD   8/5/20251:06 PM  CHIEF COMPLAINT:   Diagnosis: metastatic clear-cell renal cell carcinoma    Cancer Staging  Malignant neoplasm of female breast [facility]) Staging form: Breast, AJCC 6th Edition - Clinical: Stage I (T1b, N0, M0) - Signed by Berry Debby RAMAN, PA on 08/16/2011  Renal cell cancer [facility]) Staging form: Kidney, AJCC 8th Edition - Clinical stage from 09/26/2021: Stage IV (cT3c, cNX, cM1) - Unsigned    Prior Therapy: 1. Right nephrectomy, 02/06/21 2. Nivolumab  plus ipilimumab , 4 cycles, 11/19/21 - 01/24/22 3. lenvatinib  and everolimus , 03/13/22 - 07/22/23 4. Belzutifan , 07/29/23 - 10/2023  Current  Therapy:  Cabozantinib    HISTORY OF PRESENT ILLNESS:   Oncology History  Renal cell cancer (HCC)  06/20/2021 Initial Diagnosis   Renal cell carcinoma of right kidney (HCC)   11/19/2021 - 01/24/2022 Chemotherapy   Patient is on Treatment Plan : RENAL CELL CARCINOMA Nivolumab  + Ipilimumab  q21d / Nivolumab  q28d      Genetic Testing   Negative genetic testing. No pathogenic variants identified on the [facility] Multi-Cancer Panel + RNA. VUS in PMS2 called c.1732C>A and in RECQL4 called c.2967G>A  Identified. The report date is 11/01/2021.   The Multi-Cancer Panel + RNA offered by [facility] includes sequencing and/or deletion duplication testing of the following 84 genes: AIP, ALK, APC, ATM, AXIN2,BAP1,  BARD1, BLM, BMPR1A, BRCA1, BRCA2, BRIP1, CASR, CDC73, CDH1, CDK4, CDKN1B, CDKN1C, CDKN2A (p14ARF), CDKN2A (p16INK4a), CEBPA, CHEK2, CTNNA1, DICER1, DIS3L2, EGFR (c.2369C>T, p.Thr790Met variant only), EPCAM (Deletion/duplication testing only), FH, FLCN, GATA2, GPC3, GREM1 (Promoter region deletion/duplication testing only), HOXB13 (c.251G>A, p.Gly84Glu), HRAS, KIT, MAX, MEN1, MET, MITF (c.952G>A, p.Glu318Lys  variant only), MLH1, MSH2, MSH3, MSH6, MUTYH, NBN, NF1, NF2, NTHL1, PALB2, PDGFRA, PHOX2B, PMS2, POLD1, POLE, POT1, PRKAR1A, PTCH1, PTEN, RAD50, RAD51C, RAD51D, RB1, RECQL4, RET, RUNX1, SDHAF2, SDHA (sequence changes only), SDHB, SDHC, SDHD, SMAD4, SMARCA4, SMARCB1, SMARCE1, STK11, SUFU, TERC, TERT, TMEM127, TP53, TSC1, TSC2, VHL, WRN and WT1.       INTERVAL HISTORY:   Deanna Bush is a 63 y.o. female presenting to clinic today for follow up of metastatic clear-cell renal cell carcinoma. She was last seen by me on 03/23/2024.  Since her last visit, she underwent CT CAP on 04/22/2024 that found: Findings are consistent with progression of disease. 1.6 x 1.7 cm mass in the pericardial sac anterior to the right ventricular apex and could be invading it, previously 1.5 x 1.3 cm on the last CT and not seen on 09/29/2023, consistent with metastatic disease. Increased size of the pleural-based mass anteriorly in the left upper lobe, with other pleural-based masses in the left upper lobe similar to previous. Increased size of the large expansile mass arising from the posterior right twelfth rib infiltrating into the chest wall and underlying retroperitoneal perihepatic space. Increased size of the destructive lesion of the posterior left tenth rib. Increased size of the extensive metastatic disease to the liver, with increased mass effect narrowing the main portal vein to a pencil thin slit and effacing the IVC. Right pelvic venous congestion and engorged venous collateral flow from the left renal vein to the paraspinal area. Increased size of the bulky porta hepatis adenopathy, with increased size of left para-aortic chain adenopathy, right external iliac chain adenopathy, and pelvic mass. Increased ascites, mesenteric congestion, and increased body wall anasarca. Thickened folds in multiple small bowel segments probably congestive, versus enteritis. No bowel obstruction. Mild thickening of the bladder which could be due  to underdistention or cystitis. Aortic atherosclerosis.  Today, she states that she is doing well overall. Her appetite level is at 100%. Her energy level is at 79%. Cole denies any chest pain, back pain, heart issues, or SOB on exertion.   She notes an episode of diarrhea yesterday that resolved today as she had a regular BM this morning. Alycea report associated nausea.   She reports slightly low appetite levels due to early satiety, causing her to eat small meals. Her weight is stable.  Livi is taking lasix  every other day, antihypertensives as prescribed, and synthroid  as prescribed. She has not required pain medication for the past 2 months, though she  has   PAST MEDICAL HISTORY:   Past Medical History: Past Medical History:  Diagnosis Date   Adenocarcinoma of breast (HCC)    left    Cellulitis of leg, left    Complication of anesthesia    Hard to wake up   Depression    Diabetes mellitus without complication (HCC)    Pt denies   Family history of breast cancer 08/16/2011   Family history of breast cancer    Family history of colon cancer    Family history of kidney cancer    Family history of prostate cancer    Hyperlipidemia    Hypertension    Hypothyroidism    MRSA (methicillin resistant staph aureus) culture positive 08/19/2011   Pre-diabetes     Surgical History: Past Surgical History:  Procedure Laterality Date   BREAST SURGERY Left    mastectomy   CATARACT EXTRACTION W/PHACO Right 06/06/2023   Procedure: CATARACT EXTRACTION PHACO AND INTRAOCULAR LENS PLACEMENT (IOC);  Surgeon: Harrie Agent, MD;  Location: [facility];  Service: Ophthalmology;  Laterality: Right;  CDE 5.61   CATARACT EXTRACTION W/PHACO Left 07/21/2023   Procedure: CATARACT EXTRACTION PHACO AND INTRAOCULAR LENS PLACEMENT (IOC);  Surgeon: Harrie Agent, MD;  Location: [facility];  Service: Ophthalmology;  Laterality: Left;  CDE: 5.96   CESAREAN SECTION     x2   COLONOSCOPY N/A 03/03/2020   Procedure:  COLONOSCOPY;  Surgeon: Shaaron Lamar HERO, MD;  Location: [facility];  Service: Endoscopy;  Laterality: N/A;  12:00   IR IMAGING GUIDED PORT INSERTION  11/07/2021   left mastectomy     NEPHRECTOMY Right 02/06/2021   Procedure: NEPHRECTOMY- open radical;  Surgeon: Sherrilee Belvie CROME, MD;  Location: [facility];  Service: Urology;  Laterality: Right;   POLYPECTOMY  03/03/2020   Procedure: POLYPECTOMY;  Surgeon: Shaaron Lamar HERO, MD;  Location: [facility];  Service: Endoscopy;;    Social History: Social History   Socioeconomic History   Marital status: Married    Spouse name: Not on file   Number of children: 2   Years of education: Not on file   Highest education level: GED or equivalent  Occupational History   Occupation: CNA  Tobacco Use   Smoking status: Former    Current packs/day: 0.00    Average packs/day: 0.5 packs/day for 18.0 years (9.0 ttl pk-yrs)    Types: Cigarettes    Start date: 07/01/1984    Quit date: 07/01/2002    Years since quitting: 21.8   Smokeless tobacco: Never  Vaping Use   Vaping status: Never Used  Substance and Sexual Activity   Alcohol use: No   Drug use: No   Sexual activity: Yes    Birth control/protection: None  Other Topics Concern   Not on file  Social History Narrative   Not on file   Social Drivers of Health   Financial Resource Strain: Medium Risk (08/14/2023)   Overall Financial Resource Strain (CARDIA)    Difficulty of Paying Living Expenses: Somewhat hard  Food Insecurity: No Food Insecurity (12/08/2023)   Hunger Vital Sign    Worried About Running Out of Food in the Last Year: Never true    Ran Out of Food in the Last Year: Never true  Transportation Needs: No Transportation Needs (12/08/2023)   PRAPARE - Administrator, Civil Service (Medical): No    Lack of Transportation (Non-Medical): No  Physical Activity: Insufficiently Active (12/08/2023)   Exercise Vital Sign  Days of Exercise per Week: 3 days    Minutes of Exercise  per Session: 20 min  Stress: No Stress Concern Present (12/08/2023)   Deanna Bush of Occupational Health - Occupational Stress Questionnaire    Feeling of Stress : Only a little  Social Connections: Moderately Integrated (12/08/2023)   Social Connection and Isolation Panel    Frequency of Communication with Friends and Family: More than three times a week    Frequency of Social Gatherings with Friends and Family: More than three times a week    Attends Religious Services: More than 4 times per year    Active Member of [employer] or Organizations: Yes    Attends Engineer, structural: More than 4 times per year    Marital Status: Separated  Intimate Partner Violence: Not on file    Family History: Family History  Problem Relation Age of Onset   Alcohol abuse Mother    Hypertension Mother    Diabetes Mother    Hyperlipidemia Mother    Colon cancer Maternal Aunt        dx 66s   Prostate cancer Maternal Uncle    Throat cancer Maternal Uncle    Kidney cancer Maternal Grandfather    Lung cancer Half-Sister    Kidney cancer Half-Sister 72   Breast cancer Half-Sister 67   Breast cancer Half-Sister 58    Current Medications:  Current Outpatient Medications:    acetaminophen  (TYLENOL ) 500 MG tablet, Take 500 mg by mouth every 6 (six) hours as needed for moderate pain or headache., Disp: , Rfl:    atorvastatin  (LIPITOR) 40 MG tablet, Take 1 tablet (40 mg total) by mouth daily., Disp: 30 tablet, Rfl: 1   busPIRone  (BUSPAR ) 5 MG tablet, Take one tablet by mouth twice  daily as needed, for anxiety, Disp: 60 tablet, Rfl: 3   cabozantinib (CABOMETYX ) 40 MG tablet, Take 1 tablet (40 mg total) by mouth daily. Take on an empty stomach, 1 hour before or 2 hours after meals., Disp: 30 tablet, Rfl: 2   EPINEPHrine  0.3 mg/0.3 mL IJ SOAJ injection, Inject 0.3 mg into the muscle as needed for anaphylaxis., Disp: 2 each, Rfl: 0   furosemide  (LASIX ) 20 MG tablet, TAKE 1 TABLET(20 MG) BY MOUTH  DAILY AS NEEDED, Disp: 90 tablet, Rfl: 1   HYDROcodone -acetaminophen  (NORCO) 5-325 MG tablet, Take 1 tablet by mouth every 6 (six) hours as needed for moderate pain (pain score 4-6)., Disp: 30 tablet, Rfl: 0   levothyroxine  (SYNTHROID ) 125 MCG tablet, TAKE 1 TABLET(125 MCG) BY MOUTH DAILY BEFORE BREAKFAST, Disp: 30 tablet, Rfl: 0   magnesium  oxide (MAG-OX) 400 (240 Mg) MG tablet, Take 1 tablet (400 mg total) by mouth daily., Disp: 30 tablet, Rfl: 4   Multiple Vitamin (MULTIVITAMIN WITH MINERALS) TABS tablet, Take 1 tablet by mouth daily., Disp: , Rfl:    olmesartan -hydrochlorothiazide (BENICAR  HCT) 20-12.5 MG tablet, Take 1 tablet by mouth daily., Disp: 30 tablet, Rfl: 5   tivozanib hcl (FOTIVDA ) 1.34 MG capsule, Take 1 capsule (1.34 mg total) by mouth daily. Take for 21 days on, then off for 7 days. Repeat every 28 days., Disp: 21 capsule, Rfl: 3   UNABLE TO FIND, 6 mastectomy prosthesis and bras., Disp: 6 each, Rfl: 0   UNABLE TO FIND, Take 237 mLs by mouth in the morning and at bedtime. Med Name: Ensure Plus. DX : E46 and C64, Disp: 72 each, Rfl: 3   urea  (CARMOL) 10 % cream, Apply topically 2 (  two) times daily. Apply twice daily to hands, Disp: 71 g, Rfl: 0 No current facility-administered medications for this visit.  Facility-Administered Medications Ordered in Other Visits:    heparin  lock flush 100 unit/mL, 500 Units, Intravenous, Once, Isha Seefeld, MD   Allergies: Allergies  Allergen Reactions   Amlodipine  Swelling    Leg edema   Sulfonamide Derivatives Itching   Yellow Jacket Venom [Bee Venom] Rash    urticaria    REVIEW OF SYSTEMS:   Review of Systems  Constitutional:  Negative for chills, fatigue and fever.  HENT:   Negative for lump/mass, mouth sores, nosebleeds, sore throat and trouble swallowing.   Eyes:  Negative for eye problems.  Respiratory:  Negative for cough and shortness of breath.   Cardiovascular:  Negative for chest pain, leg swelling and  palpitations.  Gastrointestinal:  Positive for diarrhea, nausea and vomiting. Negative for abdominal pain and constipation.  Genitourinary:  Negative for bladder incontinence, difficulty urinating, dysuria, frequency, hematuria and nocturia.   Musculoskeletal:  Negative for arthralgias, back pain, flank pain, myalgias and neck pain.  Skin:  Negative for itching and rash.  Neurological:  Negative for dizziness, headaches and numbness.  Hematological:  Does not bruise/bleed easily.  Psychiatric/Behavioral:  Positive for sleep disturbance. Negative for depression and suicidal ideas. The patient is not nervous/anxious.   All other systems reviewed and are negative.    VITALS:   Blood pressure 130/71, pulse 90, temperature 98.3 F (36.8 C), temperature source Oral, resp. rate 18, weight 146 lb 13.2 oz (66.6 kg), SpO2 96%.  Wt Readings from Last 3 Encounters:  05/11/24 146 lb 13.2 oz (66.6 kg)  04/22/24 147 lb 14.9 oz (67.1 kg)  04/13/24 146 lb 1.9 oz (66.3 kg)    Body mass index is 23.7 kg/m.  Performance status (ECOG): 1 - Symptomatic but completely ambulatory  PHYSICAL EXAM:   Physical Exam Vitals and nursing note reviewed. Exam conducted with a chaperone present.  Constitutional:      Appearance: Normal appearance.  Cardiovascular:     Rate and Rhythm: Normal rate and regular rhythm.     Pulses: Normal pulses.     Heart sounds: Normal heart sounds.  Pulmonary:     Effort: Pulmonary effort is normal.     Breath sounds: Normal breath sounds.  Abdominal:     Palpations: Abdomen is soft. There is no hepatomegaly, splenomegaly or mass.     Tenderness: There is no abdominal tenderness.  Musculoskeletal:     Right lower leg: No edema.     Left lower leg: No edema.  Lymphadenopathy:     Cervical: No cervical adenopathy.     Right cervical: No superficial, deep or posterior cervical adenopathy.    Left cervical: No superficial, deep or posterior cervical adenopathy.     Upper  Body:     Right upper body: No supraclavicular or axillary adenopathy.     Left upper body: No supraclavicular or axillary adenopathy.  Neurological:     General: No focal deficit present.     Mental Status: She is alert and oriented to person, place, and time.  Psychiatric:        Mood and Affect: Mood normal.        Behavior: Behavior normal.     LABS:   CBC     Component Value Date/Time   WBC 5.7 04/22/2024 0934   RBC 2.68 (L) 04/22/2024 0934   HGB 8.6 (L) 04/22/2024 0934   HGB 11.1 07/10/2021 1530  HCT 27.4 (L) 04/22/2024 0934   HCT 33.7 (L) 07/10/2021 1530   PLT 331 04/22/2024 0934   PLT 374 07/10/2021 1530   MCV 102.2 (H) 04/22/2024 0934   MCV 89 07/10/2021 1530   MCH 32.1 04/22/2024 0934   MCHC 31.4 04/22/2024 0934   RDW 14.1 04/22/2024 0934   RDW 13.0 07/10/2021 1530   LYMPHSABS 1.3 04/22/2024 0934   LYMPHSABS 2.5 07/10/2021 1530   MONOABS 0.6 04/22/2024 0934   EOSABS 0.0 04/22/2024 0934   EOSABS 0.0 07/10/2021 1530   BASOSABS 0.0 04/22/2024 0934   BASOSABS 0.0 07/10/2021 1530    CMP      Component Value Date/Time   NA 134 (L) 04/22/2024 0934   NA 133 (L) 03/23/2024 1018   K 3.9 04/22/2024 0934   CL 100 04/22/2024 0934   CO2 22 04/22/2024 0934   GLUCOSE 82 04/22/2024 0934   BUN 18 04/22/2024 0934   BUN 28 (H) 03/23/2024 1018   CREATININE 0.89 04/22/2024 0934   CREATININE 0.73 04/22/2019 0848   CALCIUM  9.0 04/22/2024 0934   PROT 8.1 04/22/2024 0934   PROT 7.6 03/23/2024 1018   ALBUMIN 2.4 (L) 04/22/2024 0934   ALBUMIN 3.5 (L) 03/23/2024 1018   AST 30 04/22/2024 0934   ALT 19 04/22/2024 0934   ALKPHOS 139 (H) 04/22/2024 0934   BILITOT 0.4 04/22/2024 0934   BILITOT 0.3 03/23/2024 1018   GFRNONAA >60 04/22/2024 0934   GFRNONAA 90 04/22/2019 0848   GFRAA 96 10/30/2020 0914   GFRAA 104 04/22/2019 0848     No results found for: CEA1, CEA / No results found for: CEA1, CEA No results found for: PSA1 No results found for: CAN199 No  results found for: CAN125  No results found for: STEPHANY RINGS, A1GS, MARKEL EARLA BABCOCK, GAMS, MSPIKE, SPEI Lab Results  Component Value Date   TIBC 244 (L) 03/16/2024   TIBC 284 08/26/2023   TIBC 308 07/15/2023   FERRITIN 485 (H) 03/16/2024   FERRITIN 272 08/26/2023   FERRITIN 180 07/15/2023   IRONPCTSAT 23 03/16/2024   IRONPCTSAT 17 08/26/2023   IRONPCTSAT 21 07/15/2023   Lab Results  Component Value Date   LDH 178 07/15/2023   LDH 178 02/21/2022   LDH 152 09/27/2021     STUDIES:   CT CHEST ABDOMEN PELVIS W CONTRAST Result Date: 04/29/2024 CLINICAL DATA:  Metastatic clear cell renal cell carcinoma, right nephrectomy 02/06/2021, and additional history left breast cancer. Current treatment status, if any, is unspecified. EXAM: CT CHEST, ABDOMEN, AND PELVIS WITH CONTRAST TECHNIQUE: Multidetector CT imaging of the chest, abdomen and pelvis was performed following the standard protocol during bolus administration of intravenous contrast. RADIATION DOSE REDUCTION: This exam was performed according to the departmental dose-optimization program which includes automated exposure control, adjustment of the mA and/or kV according to patient size and/or use of iterative reconstruction technique. CONTRAST:  OMNIPAQUE  IOHEXOL  300 MG/ML  SOLN COMPARISON:  Chest, abdomen and pelvis CTs with contrast 02/02/2024, 09/29/2023. FINDINGS: CT CHEST FINDINGS Cardiovascular: Right chest port with IJ approach catheter terminating about the superior cavoatrial junction. The cardiac size is normal. Small anterior pericardial effusion again is noted. Anterior to apex of the right ventricle there is a 1.6 x 1.7 cm mass in the pericardial sac, previously 1.5 x 1.3 cm on the last CT and not seen on 09/29/2023, which impresses into or invades the right ventricular apex, best seen on 2:40 and consistent with metastatic disease. There is atherosclerosis in the aortic  arch and great  vessels without aneurysm, stenosis or dissection. Pulmonary arteries are normal caliber and centrally clear. The central pulmonary veins are normal in caliber. First order perihilar tributaries of the left superior and right inferior pulmonary veins are encased and narrowed by adenopathy, but this was seen previously. Mediastinum/Nodes: There is multifocal necrotic adenopathy, seen previously. There is an index AP window lymph node measuring 2.2 x 3.0 cm on 2:21, previously 2.8 x 1.9 cm. Subcarinal lymph node to the right today measuring 3.2 x 2.1 cm on 2:26, previously 3.2 x 1.9 cm. Right hilar index lymph node is 2.2 x 1.9 cm on 2:31, was previously 1.8 x 1.6 cm. No new adenopathy is seen. There are left axillary surgical clips. No axillary adenopathy is seen. The trachea, central airways, thoracic esophagus unremarkable. Lungs/Pleura: Moderate right small left layering pleural effusions, both slightly increased. Irregular pleural base mass anteriorly left upper lobe, consistent with metastasis, today is 2.8 x 3.8 cm on 3:62, previously 1.6 x 1.3 cm. The Pleural-based masses in the more cephalad left upper lobe, with anterior mass measuring 2.5 x 1.7 cm on 3:26, previously 2.4 x 1.5 cm, another noted laterally at this level is 2.9 x 1.9 cm on 3:27, previously 2.8 x 1.5 cm. Several scattered smaller nodules are unchanged. Branching opacities are again seen in the right middle lobe base consistent with small airway impactions. Musculoskeletal: There is a large expansile mass with heterogeneous calcifications, arising from the posterior right twelfth rib infiltrating into the chest wall and the underlying retroperitoneal perihepatic space. This is 9.9 x 5.2 cm on 2:57, previously 9 x 5 cm. Similar but smaller destructive lesion of the posterior left tenth rib today is 3.9 x 2.1 cm on 2:40, formerly 3.6 x 1.6 cm. No other destructive thoracic bone lesions are seen. CT ABDOMEN PELVIS FINDINGS Hepatobiliary: Extensive  metastatic disease to the liver. There is a segment 5 conglomerate mass protruding into the porta with dystrophic calcifications, when measured in a similar fashion as on the prior study, today is 7.4 x 8.2 cm on 2:55, was previously 7.5 x 7.5 cm. Index mass in segment 4 B is 5.6 x 4.4 cm on 2:60, previously 4.8 x 3.9 cm. There is a coalescent mass in segment 6, measuring 8.7 x 7.3 cm on 2:63, previously 8.6 x 7.3 cm. There is bulky porta hepatis adenopathy, scattered dystrophic calcifications within this, and increased mass effect narrowing the main portal vein to a pencil thin slit incompletely effaces the intrahepatic and extrahepatic IVC, extending from the dorsal liver surface to the spine and continuing left of the midline. Pancreas: Rim enhancing centrally necrotic mass inseparable from the anterior pancreatic neck, previously 2.9 x 1.7 cm today measuring 3.0 x 2.2 cm. This is probably abutting adenopathy rather than a primary pancreatic neoplasm. Rest of the pancreas is unremarkable. Spleen: No abnormality. Adrenals/Urinary Tract: Status post right nephrectomy. Right renal fossa is filled with: Less intact knob a 3, as before. The right adrenal probably has been previously removed. Left adrenal and left kidney are unremarkable. There is mild thickening of the bladder which could be due to cystitis or underdistention. Please correlate clinically. Stomach/Bowel: The duodenum is effaced by the extensive porta hepatis adenopathy. There is no gastric dilatation. No small bowel obstruction is seen. An appendix is not seen. There are thickened folds in multiple small bowel segments which is probably congestive, versus enteritis. The colon wall has a normal thickness. Vascular/Lymphatic: Aortic atherosclerosis. As above there is mass effect  increasingly severely narrowing the hepatic portal vein and effacing the IVC. Right pelvic venous congestion is again noted and engorged venous collateral flow from the left  renal vein to the paraspinal area. In addition to extensive porta hepatis adenopathy, there is multifocal left para-aortic chain adenopathy, largest of these 1.9 cm short axis on 2:60, previously 1.7 cm. A partially necrotic pelvic mass eccentric to the right is 5 x 5.6 cm on 2:96, was 5.5 x 4.6 cm. Enlarged right external iliac chain node today is 2.9 x 2.5 cm, previously 2.6 x 2.2 cm. Reproductive: Uterus and bilateral adnexa are unremarkable. Multiple pelvic phleboliths. Other: There is mild ascites in the abdomen and pelvis, mesenteric congestion, and increased body wall anasarca. Centrally necrotic mass impresses into the right psoas and measures 7.4 x 6.1 cm on 2:77, previously 7 x 6.4 cm. Right paraspinal musculature also demonstrates tumor invasion at the L1 and L2 level, similar to last study. Musculoskeletal: No regional destructive bone lesion is seen. IMPRESSION: 1. Findings are consistent with progression of disease. 2. 1.6 x 1.7 cm mass in the pericardial sac anterior to the right ventricular apex and could be invading it, previously 1.5 x 1.3 cm on the last CT and not seen on 09/29/2023, consistent with metastatic disease. 3. Increased size of the pleural-based mass anteriorly in the left upper lobe, with other pleural-based masses in the left upper lobe similar to previous. 4. Increased size of the large expansile mass arising from the posterior right twelfth rib infiltrating into the chest wall and underlying retroperitoneal perihepatic space. 5. Increased size of the destructive lesion of the posterior left tenth rib. 6. Increased size of the extensive metastatic disease to the liver, with increased mass effect narrowing the main portal vein to a pencil thin slit and effacing the IVC. 7. Right pelvic venous congestion and engorged venous collateral flow from the left renal vein to the paraspinal area. 8. Increased size of the bulky porta hepatis adenopathy, with increased size of left para-aortic  chain adenopathy, right external iliac chain adenopathy, and pelvic mass. 9. Increased ascites, mesenteric congestion, and increased body wall anasarca. 10. Thickened folds in multiple small bowel segments probably congestive, versus enteritis. No bowel obstruction. 11. Mild thickening of the bladder which could be due to underdistention or cystitis. 12. Aortic atherosclerosis. Aortic Atherosclerosis (ICD10-I70.0). Electronically Signed   By: Francis Quam M.D.   On: 04/29/2024 03:57

## 2024-05-11 NOTE — Telephone Encounter (Signed)
 Oral Oncology Patient Advocate Encounter  After completing a benefits investigation, prior authorization for Fotivda  is not required at this time through [facility] MEDICAID [employer] [place].  Patient's copay is $4.     Karrisa Didio ([name]) [name], CPhT  [facility], [name], [place] Oral Chemotherapy Patient Advocate Specialist III Phone: [phone]  Fax: [phone]

## 2024-05-11 NOTE — Progress Notes (Signed)
 Patient education documented in EPIC note on 05/11/24.

## 2024-05-11 NOTE — Progress Notes (Signed)
 Specialty Pharmacy Initial Fill Coordination Note  Deanna Bush is a 63 y.o. female contacted today regarding refills of specialty medication(s) Tivozanib HCl (FOTIVDA ) .  Patient requested Delivery  on 05/13/24  to verified address [address]  [name] [address]   Medication will be filled on 05/12/2024.   Patient is aware of $4 copayment.   [name] ([name]) [name], CPhT  [facility], [name], [name] Oral Chemotherapy Patient Advocate Specialist III Phone: [phone]  Fax: [phone]

## 2024-05-11 NOTE — Patient Instructions (Addendum)
 [facility]  Discharge Instructions  You were seen and examined today by Dr. Rogers.  Dr. Rogers discussed your most recent lab work and CT scan which revealed that your scan is showing that everything has grown slightly.   Stop taking the [place].  Dr. Katragadda discussed switching to Fotivda  to be taken 3 weeks on and one week off.   Follow-up as scheduled.    Thank you for choosing [facility] [name] to provide your oncology and hematology care.   To afford each patient quality time with our provider, please arrive at least 15 minutes before your scheduled appointment time. You may need to reschedule your appointment if you arrive late (10 or more minutes). Arriving late affects you and other patients whose appointments are after yours.  Also, if you miss three or more appointments without notifying the office, you may be dismissed from the clinic at the provider's discretion.    Again, thank you for choosing [facility].  Our hope is that these requests will decrease the amount of time that you wait before being seen by our physicians.   If you have a lab appointment with the Cancer Center - please note that after April 8th, all labs will be drawn in the cancer center.  You do not have to check in or register with the main entrance as you have in the past but will complete your check-in at the cancer center.            _____________________________________________________________  Should you have questions after your visit to [facility], please contact our office at [phone] and follow the prompts.  Our office hours are 8:00 a.m. to 4:30 p.m. Monday - Thursday and 8:00 a.m. to 2:30 p.m. Friday.  Please note that voicemails left after 4:00 p.m. may not be returned until the following business day.  We are closed weekends and all major holidays.  You do have access to a nurse 24-7, just call the  main number to the clinic [phone] and do not press any options, hold on the line and a nurse will answer the phone.    For prescription refill requests, have your pharmacy contact our office and allow 72 hours.    Masks are no longer required in the cancer centers. If you would like for your care team to wear a mask while they are taking care of you, please let them know. You may have one support person who is at least 65 years old accompany you for your appointments.

## 2024-05-11 NOTE — Addendum Note (Signed)
 Addended by: RODGERS RENAEE SAILOR on: 05/11/2024 03:32 PM   Modules accepted: Orders

## 2024-05-12 ENCOUNTER — Other Ambulatory Visit: Payer: Self-pay

## 2024-05-12 ENCOUNTER — Other Ambulatory Visit (HOSPITAL_COMMUNITY): Payer: Self-pay

## 2024-05-13 ENCOUNTER — Other Ambulatory Visit: Payer: Self-pay | Admitting: "Endocrinology

## 2024-05-13 DIAGNOSIS — E890 Postprocedural hypothyroidism: Secondary | ICD-10-CM

## 2024-05-14 DIAGNOSIS — C641 Malignant neoplasm of right kidney, except renal pelvis: Secondary | ICD-10-CM | POA: Diagnosis not present

## 2024-05-28 ENCOUNTER — Telehealth: Payer: Self-pay | Admitting: Family Medicine

## 2024-05-28 NOTE — Telephone Encounter (Signed)
 LVM for pt to call back and reschedule appointment Dr Antonetta is out of office

## 2024-06-01 ENCOUNTER — Other Ambulatory Visit: Payer: Self-pay

## 2024-06-01 NOTE — Progress Notes (Signed)
 Specialty Pharmacy Refill Coordination Note  Deanna Bush is a 62 y.o. female contacted today regarding refills of specialty medication(s) Tivozanib HCl (FOTIVDA )   Patient requested Delivery   Delivery date: 06/04/24   Verified address: [address]   [place] [address]   Medication will be filled on 06/03/24.

## 2024-06-01 NOTE — Progress Notes (Signed)
 Specialty Pharmacy Ongoing Clinical Assessment Note  Deanna Bush is a 63 y.o. female who is being followed by the specialty pharmacy service for RxSp Oncology   Patient's specialty medication(s) reviewed today: Tivozanib HCl (FOTIVDA )   Missed doses in the last 4 weeks: 0   Patient/Caregiver did not have any additional questions or concerns.   Therapeutic benefit summary: Patient is achieving benefit   Adverse events/side effects summary: Experienced adverse events/side effects (Patient was experiencing generalized weakness, which has resolved and she reports diarrhea, which remains tolerable with PRN Imodium)   Patient's therapy is appropriate to: Continue    Goals Addressed             This Visit's Progress    Slow Disease Progression   No change    Patient is initiating therapy. Patient will maintain adherence         Follow up: 3 months  Silvano LOISE [name] Specialty Pharmacist

## 2024-06-02 ENCOUNTER — Other Ambulatory Visit: Payer: Self-pay

## 2024-06-09 ENCOUNTER — Ambulatory Visit (HOSPITAL_COMMUNITY): Payer: Self-pay | Admitting: Psychiatry

## 2024-06-09 DIAGNOSIS — F4323 Adjustment disorder with mixed anxiety and depressed mood: Secondary | ICD-10-CM

## 2024-06-09 NOTE — Progress Notes (Signed)
 Virtual Visit via Telephone Note  I connected with Deanna Bush on 06/09/24 at 11:12 AM  by telephone and verified that I am speaking with the correct person using two identifiers.  Location: Patient: Home Provider: [facility] [place] office    I discussed the limitations, risks, security and privacy concerns of performing an evaluation and management service by telephone and the availability of in person appointments. I also discussed with the patient that there may be a patient responsible charge related to this service. The patient expressed understanding and agreed to proceed.    I provided  28   minutes of non-face-to-face time during this encounter.   Winton FORBES Rubinstein, LCSW   IN-PERSON  THERAPIST PROGRESS NOTE  Session Time:  Wednesday  06/09/2024 11:12 AM -  11:40 AM   Participation Level: Active  Behavioral Response: CasualAlert/anxious   Type of Therapy: Individual Therapy  Treatment Goals addressed: Have healthy grieving process around loss Begin verbalizing feelings associated with loss     ProgressTowards Goals: Progressing  Interventions: Supportive/CBT  Summary: Deanna Bush is a 63 y.o. female who is referred for services by PCP Dr. Antonetta due to pt experiencing symptoms of anxiety along with grief and loss issues. Pt denies any psychiatric hospitalizations. Pt has no previous involvement in outpatient therapy.  Per patient's report her stepdaughter was diagnosed with colon cancer at age 29 and died in 10-22-2022.  Patient was her caretaker and says patient died at her home.  Patient reports her husband left her about 2 weeks ago for 31 year old after 30 years of marriage.  Patient reports additional stress regarding providing care for her 64 year old mother.  Patient also has her own health issues as she was diagnosed with kidney cancer 2 years ago and had chemo.  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 She currently is taking  experimental drugs.  Patient reports crying spells, sadness, irritability, worry, anxiety, and sleeping difficulty.   Patient last was seen about 6-7 weeks ago.  She reports stress anxiety, and depressed mood since last session.  Per her report, she had her last appointment with her previous oncologist the beginning of August.  She expresses sadness about her oncologist leaving the practice.  She also reports he informed her there is a spot near her heart and it does not look good.  He did not give her a prognosis but patient anticipates she does not have long to live.  She states hoping she can make it until her birthday on [birth year].  She reports feeling overwhelmed and states this is coming much sooner than she thought it would.  She expresses sadness, fear, and worry especially about her adult sons. she states knowing they will be all right as she has faith in God but she worries she will not be here to give them guidance in their lives.  Patient has started taking a new medication as prescribed by her oncologist and reports  experiencing decreased appetite, diarrhea, weight loss, and pain since last session.  She is trying to maintain involvement in activities but reports becoming tired easily.  Patient still plans to go to the mountains.  She states wanting to go places and do things rather than being at home as long as she is capable.  She reports continued strong support from family and friends.  She is particularly grateful for a friend who visits patient daily.   Suicidal/Homicidal: Nowithout intent/plan  Therapist Response:  Reviewed symptoms, discussed stressors, facilitated expression of thoughts and feelings, validated feelings, assisted patient identify/verbalize thoughts and fears regarding recent information about her medical condition, validated feelings, assisted patient identify possible ways to impart wisdom and guidance to her children's future selves including writing letters, encouraged  patient to continue to use support system and engage in activities within her capability  Diagnosis: Adjustment disorder with mixed anxiety and depressed mood      Collaboration of Care: Primary Care Provider AEB patient is seeing PCP Dr. Rollene Pesa for medication management  Patient/Guardian was advised Release of Information must be obtained prior to any record release in order to collaborate their care with an outside provider. Patient/Guardian was advised if they have not already done so to contact the registration department to sign all necessary forms in order for us  to release information regarding their care.   Consent: Patient/Guardian gives verbal consent for treatment and assignment of benefits for services provided during this visit. Patient/Guardian expressed understanding and agreed to proceed.   Phillipa Morden E Baer Hinton, LCSW9/12/2023

## 2024-06-10 ENCOUNTER — Inpatient Hospital Stay: Attending: Hematology

## 2024-06-10 DIAGNOSIS — Z853 Personal history of malignant neoplasm of breast: Secondary | ICD-10-CM | POA: Insufficient documentation

## 2024-06-10 DIAGNOSIS — D649 Anemia, unspecified: Secondary | ICD-10-CM | POA: Insufficient documentation

## 2024-06-10 DIAGNOSIS — E038 Other specified hypothyroidism: Secondary | ICD-10-CM | POA: Insufficient documentation

## 2024-06-10 DIAGNOSIS — Z79890 Hormone replacement therapy (postmenopausal): Secondary | ICD-10-CM | POA: Diagnosis not present

## 2024-06-10 DIAGNOSIS — N179 Acute kidney failure, unspecified: Secondary | ICD-10-CM | POA: Diagnosis not present

## 2024-06-10 DIAGNOSIS — C787 Secondary malignant neoplasm of liver and intrahepatic bile duct: Secondary | ICD-10-CM | POA: Diagnosis not present

## 2024-06-10 DIAGNOSIS — R7989 Other specified abnormal findings of blood chemistry: Secondary | ICD-10-CM | POA: Diagnosis not present

## 2024-06-10 DIAGNOSIS — Z79899 Other long term (current) drug therapy: Secondary | ICD-10-CM | POA: Insufficient documentation

## 2024-06-10 DIAGNOSIS — Z905 Acquired absence of kidney: Secondary | ICD-10-CM | POA: Insufficient documentation

## 2024-06-10 DIAGNOSIS — R918 Other nonspecific abnormal finding of lung field: Secondary | ICD-10-CM | POA: Diagnosis not present

## 2024-06-10 DIAGNOSIS — E538 Deficiency of other specified B group vitamins: Secondary | ICD-10-CM | POA: Diagnosis not present

## 2024-06-10 DIAGNOSIS — C641 Malignant neoplasm of right kidney, except renal pelvis: Secondary | ICD-10-CM | POA: Insufficient documentation

## 2024-06-10 LAB — CBC WITH DIFFERENTIAL/PLATELET
Abs Immature Granulocytes: 0.05 K/uL (ref 0.00–0.07)
Basophils Absolute: 0 K/uL (ref 0.0–0.1)
Basophils Relative: 0 %
Eosinophils Absolute: 0 K/uL (ref 0.0–0.5)
Eosinophils Relative: 0 %
HCT: 25.2 % — ABNORMAL LOW (ref 36.0–46.0)
Hemoglobin: 7.9 g/dL — ABNORMAL LOW (ref 12.0–15.0)
Immature Granulocytes: 1 %
Lymphocytes Relative: 13 %
Lymphs Abs: 1 K/uL (ref 0.7–4.0)
MCH: 31.2 pg (ref 26.0–34.0)
MCHC: 31.3 g/dL (ref 30.0–36.0)
MCV: 99.6 fL (ref 80.0–100.0)
Monocytes Absolute: 0.8 K/uL (ref 0.1–1.0)
Monocytes Relative: 10 %
Neutro Abs: 6.3 K/uL (ref 1.7–7.7)
Neutrophils Relative %: 76 %
Platelets: 369 K/uL (ref 150–400)
RBC: 2.53 MIL/uL — ABNORMAL LOW (ref 3.87–5.11)
RDW: 15.1 % (ref 11.5–15.5)
WBC: 8.3 K/uL (ref 4.0–10.5)
nRBC: 0 % (ref 0.0–0.2)

## 2024-06-10 LAB — COMPREHENSIVE METABOLIC PANEL WITH GFR
ALT: 9 U/L (ref 0–44)
AST: 19 U/L (ref 15–41)
Albumin: 2.5 g/dL — ABNORMAL LOW (ref 3.5–5.0)
Alkaline Phosphatase: 124 U/L (ref 38–126)
Anion gap: 12 (ref 5–15)
BUN: 24 mg/dL — ABNORMAL HIGH (ref 8–23)
CO2: 23 mmol/L (ref 22–32)
Calcium: 9.2 mg/dL (ref 8.9–10.3)
Chloride: 93 mmol/L — ABNORMAL LOW (ref 98–111)
Creatinine, Ser: 1.02 mg/dL — ABNORMAL HIGH (ref 0.44–1.00)
GFR, Estimated: >60 mL/min (ref >60)
Glucose, Bld: 98 mg/dL (ref 70–99)
Potassium: 4.3 mmol/L (ref 3.5–5.1)
Sodium: 128 mmol/L — ABNORMAL LOW (ref 135–145)
Total Bilirubin: 0.6 mg/dL (ref 0.0–1.2)
Total Protein: 8.5 g/dL — ABNORMAL HIGH (ref 6.5–8.1)

## 2024-06-10 LAB — SAMPLE TO BLOOD BANK

## 2024-06-10 LAB — FOLATE: Folate: 7.4 ng/mL (ref >5.9)

## 2024-06-10 LAB — TSH: TSH: 15.637 u[IU]/mL — ABNORMAL HIGH (ref 0.350–4.500)

## 2024-06-10 LAB — MAGNESIUM: Magnesium: 1.7 mg/dL (ref 1.7–2.4)

## 2024-06-10 LAB — VITAMIN B12: Vitamin B-12: 213 pg/mL (ref 180–914)

## 2024-06-10 NOTE — Progress Notes (Signed)
 Deanna Bush presented for Portacath access and flush.  Portacath located right  chest wall accessed with  H 20 needle.  Good blood return present. Portacath flushed with 20ml NS and needle removed intact.  Procedure tolerated well and without incident. Treatment given today per MD orders. Discharged from clinic ambulatory in stable condition. Alert and oriented x 3. F/U with [facility] as scheduled.

## 2024-06-10 NOTE — Progress Notes (Unsigned)
 Patient Care Team: Deanna Rollene BRAVO, MD as PCP - General Mallipeddi, [name], MD as PCP - Cardiology (Cardiology)  Clinic Day:  06/11/2024  Referring physician: [name], MD   CHIEF COMPLAINT:  CC: Metastatic clear cell RCC  Deanna Bush 63 y.o. female was transferred to my care after her prior physician has left.   ASSESSMENT & PLAN:   Assessment & Plan: Deanna Bush  is a 63 y.o. female with metastatic clear cell RCC  Assessment & Plan Renal cell carcinoma of right kidney (HCC) Metastatic renal cell carcinoma s/p lines of treatment Oncology history as below Patient currently on Tivozanib and reports no complaints from it  - Patient denies bleeding or hypertension. - Continue taking Tivozanib 3 weeks on/1 week off -Urine dipstick showed a protein of 2+.  But this is baseline for patient. - Will obtain a CT scan in 3 months that is 07/2024 after completing 3 months of treatment  Return to clinic in 1 month with labs AKI (acute kidney injury) [facility] Patient has elevated creatinine today.  Likely multifactorial but also uses Lasix   - Recommended to hold Lasix  today - Will administer 1 L normal saline today Symptomatic anemia Patient has symptomatic anemia with a hemoglobin of 7.9 today Hemodynamically stable.  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  As per documentation, declined antiestrogen therapy.     The patient understands the plans discussed today and is in agreement with them.  She knows to contact our office if she develops concerns prior to her next appointment.  100 minutes of total time was spent for this patient encounter, including preparation,review of records,  face-to-face counseling with the patient and coordination of care, physical exam, and documentation of the encounter.   Deanna Dry, MD   [facility] [place] - A DEPT OF Deanna Bush [address] [place] [address] Dept: [phone] Dept Fax: [phone]   Orders Placed This Encounter  Procedures   Ferritin    Standing Status:   Future    Number of Occurrences:   1    Expiration Date:   06/11/2025   Iron  and TIBC    Standing Status:   Future    Number of Occurrences:   1    Expected Date:   06/11/2024    Expiration Date:   06/11/2025   Urinalysis, dipstick only    Standing Status:   Future    Number of Occurrences:   1    Expiration Date:   06/11/2025   Ambulatory referral to Social Work    Referral Priority:   Routine    Referral Type:   Consultation    Referral Reason:   Specialty Services Required    Number of Visits  Requested:   1   Informed Consent Details: Physician/Practitioner Attestation; Transcribe to consent form and obtain patient signature    Standing Status:   Standing    Number of Occurrences:   1    Physician/Practitioner attestation of informed consent for blood and or blood product transfusion:   I, the physician/practitioner, attest that I have discussed with the patient the benefits, risks, side effects, alternatives, likelihood of achieving goals and potential problems during recovery for the procedure that I have provided informed consent.     Product(s):   All Product(s)     ONCOLOGY HISTORY:   I have reviewed her chart and materials related to her cancer extensively and collaborated history with the patient. Summary of oncologic history is as follows:   Metastatic clear cell RCC:  -12/23/2020: Presented to the ER with hematuria  -12/24/2020: CT renal stone study: Large right renal upper pole mass most consistent with Malignancy -01/02/2021: MRI abdomen: 12.9 cm in long axis right kidney upper pole tumor abuts the liver and diaphragm, and extends into the right renal pelvis where there is probable involvement of the right renal vein (but not the IVC). -02/06/2021: Kidney, adrenal, thrombus, right, radical nephrectomy: - Clear-cell renal cell carcinoma, nuclear grade 4, size 12.5 - Necrosis and rhabdoid features not identified -Tumor extends into and invades the wall of vena cava(pT3c)  -Vascular, ureteral and all margins of resection are negative for tumor  -05/28/2021: CT abdomen:Several nodules along the peritoneal surface of the RIGHT nephrectomy bed warrant close attention on routine surveillance. -09/18/2021: CT abdomen: Progression nodularity within the RIGHT nephrectomy bed. Now with obvious enlarged enhancing nodules adjacent to the RIGHT hepatic lobe extending inferiorly along the RIGHT retroperitoneum with multiple enlarged enhancing nodules.Direct metastatic invasion into the RIGHT hepatic lobe.  -10/03/2021: CT chest: There are several pulmonary nodules scattered throughout the lungs bilaterally, some of which could be indicative of metastatic disease to the lungs, however, many of these nodules appear to reflect areas of mucoid impaction, potentially from atypical infection. Close attention on follow-up studies is recommended to ensure the stability or regression of these lesions. - 11/14/2021: [facility] NGS: MSI-stable, pMMR, PD-L1: Negative 1+, 2%.TMB low, 3mut/Mb  - BRAF, NTRK 1/2/3, RET, BRCA1/2, FH, MET, MYCN,  PALB2, SDHA, SDHB, SDHC, SDHD, SETD2: Negative - 10/22/2021: Kidney, right nephrectomy: Clear-cell renal cell carcinoma with necrosis  -11/19/2021-01/24/2022: Ipilimumab  and Nivolumab  x 4 cycles -02/14/2022: CT CAP:  Numerous new and enlarged pulmonary nodules throughout the lungs, the majority of which are clustered and concentrated in bronchiolar distributions. Findings are consistent with worsened pulmonary metastatic disease. Significant interval increase in now relatively bulky hypodense nodularity throughout the right retroperitoneum, hepatorenal recess, and right paracolic gutter, consistent with worsened soft tissue metastatic disease. New hypodense liver lesions, consistent with new hepatic metastatic disease. -03/13/2022-07/22/2023: Lenvatinib  12mg  and Everolimus  5mg  daily -05/20/2022, 09/09/2022, 01/01/2023: CT CAP: Stable to treatment response with decreased size of lesions in abdomen. -04/29/2023: CT CAP: Very extensive rim enhancing soft tissue nodularity throughout the right retroperitoneum, hepatorenal fossa, right paracolic gutter, and porta hepatis, as well as within the overlying right flank musculature. Some very slightly enlarged nodules inferiorly, as well as two new small nodules in the right erector spinae musculature, consistent with slightly worsened local disease. Multiple hypodense liver parenchymal lesions are not significantly changed in size, however some of these are increasingly hypodense, suggesting treatment response of hepatic metastatic disease. No new liver lesions. -07/15/2023: CT CAP: Progressive extensive soft tissue nodularity in the right retroperitoneum,  hepatorenal fossa, right pericolic gutter and porta hepatis extending into the musculature/soft tissues overlying the right flank. Increased burden of tumor nodularity or thrombus involving the IVC at the level of the right renal vein with increased burden of IVC thrombus versus mixing artifact of the IVC and  extending down the right iliac veins. New pathologically enlarged prevascular lymph node measures 10 mm. Bilobar hypodense hepatic lesions again seen some of which have increased in size and others are stable. No new suspicious hepatic lesion identified. -07/29/2023-10/10/2023: Belzutifan  120mg  daily -09/29/2023: CT CAP: Massive increase in metastatic disease.  -10/20/2023-05/11/2024: Cabozantinib 40mg  daily -02/02/2024: CT CAP: Stable to mild improvement in all areas -04/22/2024: CT CAP: Findings are consistent with progression of disease in almost all areas. -06/11/2024-Current: Tivozanib 1.34mg  daily   Current Treatment:  Tivozanib 1.34mg  daily 3 weeks on/1 week off   INTERVAL HISTORY:   Deanna Bush is here today for follow up.   She has been experiencing weakness for the last couple of days. No blood is seen in her stools or urine. She is currently on tivozanib, which she takes for 21 days followed by a week off, and she just restarted the medication today after her off week.  She is under the care of Deanna Bush for thyroid  management. No history of blood clots. She reports some pain that started in the last two days, which comes and goes. She takes Tylenol  for pain and has pain medication but prefers not to use it unless necessary.  She takes Lasix  every other day. Her blood pressure medication is taken based on her daily blood pressure readings, only if it is high.  She has lost five pounds recently and reports not eating well for the last couple of days.  I have reviewed the past medical history, past surgical history, social history and family history with the patient and they are unchanged from previous note.  ALLERGIES:  is allergic to amlodipine , sulfonamide derivatives, and bee venom.  MEDICATIONS:  Current Outpatient Medications  Medication Sig Dispense Refill   acetaminophen  (TYLENOL ) 500 MG tablet Take 500 mg by mouth every 6 (six) hours as needed for moderate pain or  headache.     atorvastatin  (LIPITOR) 40 MG tablet Take 1 tablet (40 mg total) by mouth daily. 30 tablet 1   busPIRone  (BUSPAR ) 5 MG tablet Take one tablet by mouth twice  daily as needed, for anxiety 60 tablet 3   EPINEPHrine  0.3 mg/0.3 mL IJ SOAJ injection Inject 0.3 mg into the muscle as needed for anaphylaxis. 2 each 0   furosemide  (LASIX ) 20 MG tablet TAKE 1 TABLET(20 MG) BY MOUTH DAILY AS NEEDED 90 tablet 1   HYDROcodone -acetaminophen  (NORCO) 5-325 MG tablet Take 1 tablet by mouth every 6 (six) hours as needed for moderate pain (pain score 4-6). 30 tablet 0   levothyroxine  (SYNTHROID ) 125 MCG tablet TAKE 1 TABLET(125 MCG) BY MOUTH DAILY BEFORE BREAKFAST 90 tablet 1   magnesium  oxide (MAG-OX) 400 (240 Mg) MG tablet Take 1 tablet (400 mg total) by mouth daily. 30 tablet 4   Multiple Vitamin (MULTIVITAMIN WITH MINERALS) TABS tablet Take 1 tablet by mouth daily.     olmesartan -hydrochlorothiazide (BENICAR  HCT) 20-12.5 MG tablet Take 1 tablet by mouth daily. 30 tablet 5   tivozanib hcl (FOTIVDA ) 1.34 MG capsule Take 1 capsule (1.34 mg total) by mouth daily. Take for 21 days on, then off for 7 days. Repeat every 28 days. 21 capsule 3   UNABLE TO FIND 6  mastectomy prosthesis and bras. 6 each 0   UNABLE TO FIND Take 237 mLs by mouth in the morning and at bedtime. Med Name: Ensure Plus. DX : E46 and C64 72 each 3   urea  (CARMOL) 10 % cream Apply topically 2 (two) times daily. Apply twice daily to hands 71 g 0   No current facility-administered medications for this visit.   Facility-Administered Medications Ordered in Other Visits  Medication Dose Route Frequency Provider Last Rate Last Admin   0.9 %  sodium chloride  infusion (Manually program via Guardrails IV Fluids)  250 mL Intravenous Continuous Jeanpierre Thebeau, MD   Stopped at 06/11/24 1240   heparin  lock flush 100 unit/mL  500 Units Intravenous Once Katragadda, Sreedhar, MD        REVIEW OF SYSTEMS:   Constitutional: Denies fevers, chills  or abnormal weight loss Eyes: Denies blurriness of vision Ears, nose, mouth, throat, and face: Denies mucositis or sore throat Respiratory: Denies cough, dyspnea or wheezes Cardiovascular: Denies palpitation, chest discomfort or lower extremity swelling Gastrointestinal:  Denies nausea, heartburn or change in bowel habits Skin: Denies abnormal skin rashes Lymphatics: Denies new lymphadenopathy or easy bruising Neurological:Denies numbness, tingling or new weaknesses Behavioral/Psych: Mood is stable, no new changes  All other systems were reviewed with the patient and are negative.   VITALS:  Blood pressure 128/71, pulse (!) 109, temperature 97.7 F (36.5 C), temperature source Tympanic, resp. rate 19, SpO2 97%.  Wt Readings from Last 3 Encounters:  05/11/24 146 lb 13.2 oz (66.6 kg)  04/22/24 147 lb 14.9 oz (67.1 kg)  04/13/24 146 lb 1.9 oz (66.3 kg)    There is no height or weight on file to calculate BMI.  Performance status (ECOG): 1 - Symptomatic but completely ambulatory  PHYSICAL EXAM:   GENERAL:alert, no distress and comfortable SKIN: skin color, texture, turgor are normal, no rashes or significant lesions LYMPH:  no palpable lymphadenopathy in the cervical, axillary or inguinal LUNGS: clear to auscultation and percussion with normal breathing effort HEART: regular rate & rhythm and no murmurs and no lower extremity edema ABDOMEN:abdomen soft, non-tender and normal bowel sounds Musculoskeletal:no cyanosis of digits and no clubbing  NEURO: alert & oriented x 3 with fluent speech, no focal motor/sensory deficits  LABORATORY DATA:  I have reviewed the data as listed  Lab Results  Component Value Date   WBC 8.3 06/10/2024   NEUTROABS 6.3 06/10/2024   HGB 7.9 (L) 06/10/2024   HCT 25.2 (L) 06/10/2024   MCV 99.6 06/10/2024   PLT 369 06/10/2024      Chemistry      Component Value Date/Time   NA 128 (L) 06/10/2024 1013   NA 133 (L) 03/23/2024 1018   K 4.3 06/10/2024  1013   CL 93 (L) 06/10/2024 1013   CO2 23 06/10/2024 1013   BUN 24 (H) 06/10/2024 1013   BUN 28 (H) 03/23/2024 1018   CREATININE 1.02 (H) 06/10/2024 1013   CREATININE 0.73 04/22/2019 0848      Component Value Date/Time   CALCIUM  9.2 06/10/2024 1013   ALKPHOS 124 06/10/2024 1013   AST 19 06/10/2024 1013   ALT 9 06/10/2024 1013   BILITOT 0.6 06/10/2024 1013   BILITOT 0.3 03/23/2024 1018      Latest Reference Range & Units 06/10/24 10:13 06/11/24 09:04  Iron  28 - 170 ug/dL  27 (L)  UIBC ug/dL  810  TIBC 749 - 549 ug/dL  783 (L)  Saturation Ratios 10.4 - 31.8 %  13  Ferritin 11 - 307 ng/mL  1,017 (H)  Folate >5.9 ng/mL 7.4   (L): Data is abnormally low (H): Data is abnormally high   Latest Reference Range & Units 06/10/24 10:12  Vitamin B12 180 - 914 pg/mL 213    Latest Reference Range & Units 06/10/24 10:13  TSH 0.350 - 4.500 uIU/mL 15.637 (H)  (H): Data is abnormally high   Latest Reference Range & Units 06/11/24 09:04  Appearance CLEAR  HAZY !  Bilirubin Urine NEGATIVE  NEGATIVE  Color, Urine YELLOW  YELLOW  Glucose, UA NEGATIVE mg/dL NEGATIVE  Hgb urine dipstick NEGATIVE  NEGATIVE  Ketones, ur NEGATIVE mg/dL NEGATIVE  Leukocytes,Ua NEGATIVE  SMALL !  Nitrite NEGATIVE  NEGATIVE  pH 5.0 - 8.0  5.0  Protein NEGATIVE mg/dL 899 !  Specific Gravity, Urine 1.005 - 1.030  1.017  !: Data is abnormal  RADIOGRAPHIC STUDIES: I have personally reviewed the radiological images as listed and agreed with the findings in the report.  CT CHEST ABDOMEN PELVIS W CONTRAST CLINICAL DATA:  Metastatic clear cell renal cell carcinoma, right nephrectomy 02/06/2021, and additional history left breast cancer. Current treatment status, if any, is unspecified.  EXAM: CT CHEST, ABDOMEN, AND PELVIS WITH CONTRAST  TECHNIQUE: Multidetector CT imaging of the chest, abdomen and pelvis was performed following the standard protocol during bolus administration of intravenous  contrast.  RADIATION DOSE REDUCTION: This exam was performed according to the departmental dose-optimization program which includes automated exposure control, adjustment of the mA and/or kV according to patient size and/or use of iterative reconstruction technique.  CONTRAST:  OMNIPAQUE  IOHEXOL  300 MG/ML  SOLN  COMPARISON:  Chest, abdomen and pelvis CTs with contrast 02/02/2024, 09/29/2023.  FINDINGS: CT CHEST FINDINGS  Cardiovascular: Right chest port with IJ approach catheter terminating about the superior cavoatrial junction. The cardiac size is normal. Small anterior pericardial effusion again is noted.  Anterior to apex of the right ventricle there is a 1.6 x 1.7 cm mass in the pericardial sac, previously 1.5 x 1.3 cm on the last CT and not seen on 09/29/2023, which impresses into or invades the right ventricular apex, best seen on 2:40 and consistent with metastatic disease.  There is atherosclerosis in the aortic arch and great vessels without aneurysm, stenosis or dissection. Pulmonary arteries are normal caliber and centrally clear.  The central pulmonary veins are normal in caliber. First order perihilar tributaries of the left superior and right inferior pulmonary veins are encased and narrowed by adenopathy, but this was seen previously.  Mediastinum/Nodes: There is multifocal necrotic adenopathy, seen previously.  There is an index AP window lymph node measuring 2.2 x 3.0 cm on 2:21, previously 2.8 x 1.9 cm.  Subcarinal lymph node to the right today measuring 3.2 x 2.1 cm on 2:26, previously 3.2 x 1.9 cm.  Right hilar index lymph node is 2.2 x 1.9 cm on 2:31, was previously 1.8 x 1.6 cm.  No new adenopathy is seen. There are left axillary surgical clips. No axillary adenopathy is seen. The trachea, central airways, thoracic esophagus unremarkable.  Lungs/Pleura: Moderate right small left layering pleural effusions, both slightly  increased.  Irregular pleural base mass anteriorly left upper lobe, consistent with metastasis, today is 2.8 x 3.8 cm on 3:62, previously 1.6 x 1.3 cm. The  Pleural-based masses in the more cephalad left upper lobe, with anterior mass measuring 2.5 x 1.7 cm on 3:26, previously 2.4 x 1.5 cm, another noted laterally at this level is 2.9  x 1.9 cm on 3:27, previously 2.8 x 1.5 cm.  Several scattered smaller nodules are unchanged. Branching opacities are again seen in the right middle lobe base consistent with small airway impactions.  Musculoskeletal: There is a large expansile mass with heterogeneous calcifications, arising from the posterior right twelfth rib infiltrating into the chest wall and the underlying retroperitoneal perihepatic space. This is 9.9 x 5.2 cm on 2:57, previously 9 x 5 cm.  Similar but smaller destructive lesion of the posterior left tenth rib today is 3.9 x 2.1 cm on 2:40, formerly 3.6 x 1.6 cm. No other destructive thoracic bone lesions are seen.  CT ABDOMEN PELVIS FINDINGS  Hepatobiliary: Extensive metastatic disease to the liver.  There is a segment 5 conglomerate mass protruding into the porta with dystrophic calcifications, when measured in a similar fashion as on the prior study, today is 7.4 x 8.2 cm on 2:55, was previously 7.5 x 7.5 cm.  Index mass in segment 4 B is 5.6 x 4.4 cm on 2:60, previously 4.8 x 3.9 cm.  There is a coalescent mass in segment 6, measuring 8.7 x 7.3 cm on 2:63, previously 8.6 x 7.3 cm.  There is bulky porta hepatis adenopathy, scattered dystrophic calcifications within this, and increased mass effect narrowing the main portal vein to a pencil thin slit incompletely effaces the intrahepatic and extrahepatic IVC, extending from the dorsal liver surface to the spine and continuing left of the midline.  Pancreas: Rim enhancing centrally necrotic mass inseparable from the anterior pancreatic neck, previously 2.9 x 1.7 cm  today measuring 3.0 x 2.2 cm. This is probably abutting adenopathy rather than a primary pancreatic neoplasm. Rest of the pancreas is unremarkable.  Spleen: No abnormality.  Adrenals/Urinary Tract: Status post right nephrectomy. Right renal fossa is filled with: Less intact knob a 3, as before.  The right adrenal probably has been previously removed. Left adrenal and left kidney are unremarkable.  There is mild thickening of the bladder which could be due to cystitis or underdistention. Please correlate clinically.  Stomach/Bowel: The duodenum is effaced by the extensive porta hepatis adenopathy. There is no gastric dilatation. No small bowel obstruction is seen.  An appendix is not seen. There are thickened folds in multiple small bowel segments which is probably congestive, versus enteritis. The colon wall has a normal thickness.  Vascular/Lymphatic: Aortic atherosclerosis. As above there is mass effect increasingly severely narrowing the hepatic portal vein and effacing the IVC.  Right pelvic venous congestion is again noted and engorged venous collateral flow from the left renal vein to the paraspinal area.  In addition to extensive porta hepatis adenopathy, there is multifocal left para-aortic chain adenopathy, largest of these 1.9 cm short axis on 2:60, previously 1.7 cm.  A partially necrotic pelvic mass eccentric to the right is 5 x 5.6 cm on 2:96, was 5.5 x 4.6 cm.  Enlarged right external iliac chain node today is 2.9 x 2.5 cm, previously 2.6 x 2.2 cm.  Reproductive: Uterus and bilateral adnexa are unremarkable. Multiple pelvic phleboliths.  Other: There is mild ascites in the abdomen and pelvis, mesenteric congestion, and increased body wall anasarca.  Centrally necrotic mass impresses into the right psoas and measures 7.4 x 6.1 cm on 2:77, previously 7 x 6.4 cm.  Right paraspinal musculature also demonstrates tumor invasion at the L1 and L2 level, similar  to last study.  Musculoskeletal: No regional destructive bone lesion is seen.  IMPRESSION: 1. Findings are consistent with progression of disease.  2. 1.6 x 1.7 cm mass in the pericardial sac anterior to the right ventricular apex and could be invading it, previously 1.5 x 1.3 cm on the last CT and not seen on 09/29/2023, consistent with metastatic disease. 3. Increased size of the pleural-based mass anteriorly in the left upper lobe, with other pleural-based masses in the left upper lobe similar to previous. 4. Increased size of the large expansile mass arising from the posterior right twelfth rib infiltrating into the chest wall and underlying retroperitoneal perihepatic space. 5. Increased size of the destructive lesion of the posterior left tenth rib. 6. Increased size of the extensive metastatic disease to the liver, with increased mass effect narrowing the main portal vein to a pencil thin slit and effacing the IVC. 7. Right pelvic venous congestion and engorged venous collateral flow from the left renal vein to the paraspinal area. 8. Increased size of the bulky porta hepatis adenopathy, with increased size of left para-aortic chain adenopathy, right external iliac chain adenopathy, and pelvic mass. 9. Increased ascites, mesenteric congestion, and increased body wall anasarca. 10. Thickened folds in multiple small bowel segments probably congestive, versus enteritis. No bowel obstruction. 11. Mild thickening of the bladder which could be due to underdistention or cystitis. 12. Aortic atherosclerosis.  Aortic Atherosclerosis (ICD10-I70.0).  Electronically Signed   By: Francis Quam M.D.   On: 04/29/2024 03:57

## 2024-06-11 ENCOUNTER — Inpatient Hospital Stay (HOSPITAL_BASED_OUTPATIENT_CLINIC_OR_DEPARTMENT_OTHER): Admitting: Oncology

## 2024-06-11 ENCOUNTER — Inpatient Hospital Stay

## 2024-06-11 ENCOUNTER — Inpatient Hospital Stay: Admitting: Oncology

## 2024-06-11 VITALS — BP 128/71 | HR 109 | Temp 97.7°F | Resp 19

## 2024-06-11 DIAGNOSIS — C50912 Malignant neoplasm of unspecified site of left female breast: Secondary | ICD-10-CM

## 2024-06-11 DIAGNOSIS — E038 Other specified hypothyroidism: Secondary | ICD-10-CM

## 2024-06-11 DIAGNOSIS — E538 Deficiency of other specified B group vitamins: Secondary | ICD-10-CM | POA: Diagnosis not present

## 2024-06-11 DIAGNOSIS — C641 Malignant neoplasm of right kidney, except renal pelvis: Secondary | ICD-10-CM

## 2024-06-11 DIAGNOSIS — N179 Acute kidney failure, unspecified: Secondary | ICD-10-CM

## 2024-06-11 DIAGNOSIS — D649 Anemia, unspecified: Secondary | ICD-10-CM

## 2024-06-11 LAB — URINALYSIS, DIPSTICK ONLY
Bilirubin Urine: NEGATIVE
Glucose, UA: NEGATIVE mg/dL
Hgb urine dipstick: NEGATIVE
Ketones, ur: NEGATIVE mg/dL
Nitrite: NEGATIVE
Protein, ur: 100 mg/dL — AB
Specific Gravity, Urine: 1.017 (ref 1.005–1.030)
pH: 5 (ref 5.0–8.0)

## 2024-06-11 LAB — IRON AND TIBC
Iron: 27 ug/dL — ABNORMAL LOW (ref 28–170)
Saturation Ratios: 13 % (ref 10.4–31.8)
TIBC: 216 ug/dL — ABNORMAL LOW (ref 250–450)
UIBC: 189 ug/dL

## 2024-06-11 LAB — FERRITIN: Ferritin: 1017 ng/mL — ABNORMAL HIGH (ref 11–307)

## 2024-06-11 LAB — PREPARE RBC (CROSSMATCH)

## 2024-06-11 MED ORDER — DIPHENHYDRAMINE HCL 25 MG PO CAPS
25.0000 mg | ORAL_CAPSULE | Freq: Once | ORAL | Status: AC
  Administered 2024-06-11: 25 mg via ORAL
  Filled 2024-06-11: qty 1

## 2024-06-11 MED ORDER — SODIUM CHLORIDE 0.9 % IV SOLN: Freq: Once | INTRAVENOUS | Status: AC

## 2024-06-11 MED ORDER — ACETAMINOPHEN 325 MG PO TABS
650.0000 mg | ORAL_TABLET | Freq: Once | ORAL | Status: AC
  Administered 2024-06-11: 650 mg via ORAL
  Filled 2024-06-11: qty 2

## 2024-06-11 MED ORDER — ACETAMINOPHEN 325 MG PO TABS: 650.0000 mg | ORAL_TABLET | Freq: Once | ORAL | Status: DC

## 2024-06-11 MED ORDER — DIPHENHYDRAMINE HCL 25 MG PO CAPS: 25.0000 mg | ORAL_CAPSULE | Freq: Once | ORAL | Status: DC

## 2024-06-11 MED ORDER — SODIUM CHLORIDE 0.9% IV SOLUTION
250.0000 mL | INTRAVENOUS | Status: AC
  Administered 2024-06-11: 100 mL via INTRAVENOUS

## 2024-06-11 NOTE — Progress Notes (Deleted)
 Hemoglobin on 06/10/24 was 7.9.  We will give 1 unit of PRBC per standing orders by Dr. Davonna.  We will also give 1 L of NS per verbal order by Dr. Davonna.  Vital signs are stable.  Patient voiced no new complaints.  We will proceed with infusions per provider orders.

## 2024-06-11 NOTE — Assessment & Plan Note (Addendum)
 Metastatic renal cell carcinoma s/p lines of treatment Oncology history as below Patient currently on Tivozanib and reports no complaints from it  - Patient denies bleeding or hypertension. - Continue taking Tivozanib 3 weeks on/1 week off -Urine dipstick showed a protein of 2+.  But this is baseline for patient. - Will obtain a CT scan in 3 months that is 07/2024 after completing 3 months of treatment  Return to clinic in 1 month with labs

## 2024-06-11 NOTE — Assessment & Plan Note (Addendum)
 Patient with a history of hypothyroidism being managed by Dr. Lenis Currently on levothyroxine  125mcg daily  - Recent TSH 15.637 - Will relay this information to Dr. Nida

## 2024-06-11 NOTE — Progress Notes (Signed)
 Hemoglobin on 06/10/24 was 7.9.  We will give 1 unit of PRBC per standing orders by Dr. Davonna.  We will also give 1 L of NS per verbal order by Dr. Davonna.  Vital signs are stable.  Patient voiced no new complaints.  We will proceed with infusions per provider orders.   Patient tolerated treatments well with no complaints voiced.  Patient left via wheelchair in stable condition.  Vital signs stable at discharge.  Follow up as scheduled.

## 2024-06-11 NOTE — Assessment & Plan Note (Addendum)
 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  As per documentation, declined antiestrogen therapy.

## 2024-06-11 NOTE — Assessment & Plan Note (Addendum)
 Patient has symptomatic anemia with a hemoglobin of 7.9 today Hemodynamically stable.  No active bleeding reported Iron  panel: Elevated ferritin >1000 but low TSAT of 13  - Will administer 1 unit blood transfusion today

## 2024-06-11 NOTE — Assessment & Plan Note (Addendum)
 Patient has elevated creatinine today.  Likely multifactorial but also uses Lasix   - Recommended to hold Lasix  today - Will administer 1 L normal saline today

## 2024-06-11 NOTE — Patient Instructions (Signed)

## 2024-06-11 NOTE — Assessment & Plan Note (Addendum)
 Patient has mild vitamin B12 deficiency with levels less than 400  - Start oral vitamin B12 1000 mcg daily

## 2024-06-12 LAB — TYPE AND SCREEN
ABO/RH(D): O POS
Antibody Screen: NEGATIVE
Unit division: 0

## 2024-06-12 LAB — BPAM RBC
Blood Product Expiration Date: 202509302359
ISSUE DATE / TIME: 202509051043
Unit Type and Rh: 5100

## 2024-06-16 ENCOUNTER — Inpatient Hospital Stay: Admitting: Licensed Clinical Social Worker

## 2024-06-16 ENCOUNTER — Other Ambulatory Visit: Payer: Self-pay | Admitting: *Deleted

## 2024-06-16 DIAGNOSIS — C641 Malignant neoplasm of right kidney, except renal pelvis: Secondary | ICD-10-CM

## 2024-06-16 MED ORDER — MAGNESIUM OXIDE -MG SUPPLEMENT 400 (240 MG) MG PO TABS: 400.0000 mg | ORAL_TABLET | Freq: Every day | ORAL | 4 refills | Status: AC

## 2024-06-16 NOTE — Progress Notes (Signed)
 [facility] CSW Progress Note  Clinical Child psychotherapist contacted patient by phone to follow-up on advance directives questions.    Interventions: CSW confirmed pt's address and mailed an advanced directives packet to pt.  Pt also informed of availability to complete and notarize documents with pt if desired.        Follow Up Plan:  Patient will contact CSW with any support or resource needs    Deanna JONELLE Manna, LCSW Clinical Social Worker [facility]    Patient is participating in a Managed Medicaid Plan:  Yes

## 2024-06-23 ENCOUNTER — Other Ambulatory Visit: Payer: Self-pay

## 2024-07-01 ENCOUNTER — Other Ambulatory Visit: Payer: Self-pay

## 2024-07-01 NOTE — Progress Notes (Signed)
 Specialty Pharmacy Refill Coordination Note  Deanna Bush is a 63 y.o. female contacted today regarding refills of specialty medication(s) Tivozanib HCl (FOTIVDA )   Patient requested Delivery   Delivery date: 07/06/24   Verified address: [address]  [place] [address]   Medication will be filled on 09.29.25.

## 2024-07-02 ENCOUNTER — Other Ambulatory Visit: Payer: Self-pay

## 2024-07-05 ENCOUNTER — Other Ambulatory Visit: Payer: Self-pay

## 2024-07-12 ENCOUNTER — Inpatient Hospital Stay

## 2024-07-12 ENCOUNTER — Inpatient Hospital Stay: Payer: Medicare (Managed Care) | Attending: Hematology | Admitting: Oncology

## 2024-07-12 ENCOUNTER — Inpatient Hospital Stay: Payer: Medicare (Managed Care)

## 2024-07-12 VITALS — BP 148/90 | HR 94 | Temp 96.6°F | Resp 18 | Wt 143.1 lb

## 2024-07-12 DIAGNOSIS — E538 Deficiency of other specified B group vitamins: Secondary | ICD-10-CM | POA: Insufficient documentation

## 2024-07-12 DIAGNOSIS — Z23 Encounter for immunization: Secondary | ICD-10-CM | POA: Insufficient documentation

## 2024-07-12 DIAGNOSIS — D630 Anemia in neoplastic disease: Secondary | ICD-10-CM | POA: Diagnosis not present

## 2024-07-12 DIAGNOSIS — M272 Inflammatory conditions of jaws: Secondary | ICD-10-CM | POA: Insufficient documentation

## 2024-07-12 DIAGNOSIS — C641 Malignant neoplasm of right kidney, except renal pelvis: Secondary | ICD-10-CM | POA: Diagnosis not present

## 2024-07-12 DIAGNOSIS — Z905 Acquired absence of kidney: Secondary | ICD-10-CM | POA: Diagnosis not present

## 2024-07-12 DIAGNOSIS — C787 Secondary malignant neoplasm of liver and intrahepatic bile duct: Secondary | ICD-10-CM | POA: Diagnosis not present

## 2024-07-12 DIAGNOSIS — D649 Anemia, unspecified: Secondary | ICD-10-CM

## 2024-07-12 LAB — CBC WITH DIFFERENTIAL/PLATELET
Abs Immature Granulocytes: 0.02 K/uL (ref 0.00–0.07)
Basophils Absolute: 0 K/uL (ref 0.0–0.1)
Basophils Relative: 0 %
Eosinophils Absolute: 0 K/uL (ref 0.0–0.5)
Eosinophils Relative: 0 %
HCT: 29.1 % — ABNORMAL LOW (ref 36.0–46.0)
Hemoglobin: 9.1 g/dL — ABNORMAL LOW (ref 12.0–15.0)
Immature Granulocytes: 0 %
Lymphocytes Relative: 20 %
Lymphs Abs: 1.4 K/uL (ref 0.7–4.0)
MCH: 30.2 pg (ref 26.0–34.0)
MCHC: 31.3 g/dL (ref 30.0–36.0)
MCV: 96.7 fL (ref 80.0–100.0)
Monocytes Absolute: 0.7 K/uL (ref 0.1–1.0)
Monocytes Relative: 10 %
Neutro Abs: 4.9 K/uL (ref 1.7–7.7)
Neutrophils Relative %: 70 %
Platelets: 352 K/uL (ref 150–400)
RBC: 3.01 MIL/uL — ABNORMAL LOW (ref 3.87–5.11)
RDW: 15.1 % (ref 11.5–15.5)
WBC: 7.1 K/uL (ref 4.0–10.5)
nRBC: 0 % (ref 0.0–0.2)

## 2024-07-12 LAB — COMPREHENSIVE METABOLIC PANEL WITH GFR
ALT: 14 U/L (ref 0–44)
AST: 61 U/L — ABNORMAL HIGH (ref 15–41)
Albumin: 3.4 g/dL — ABNORMAL LOW (ref 3.5–5.0)
Alkaline Phosphatase: 216 U/L — ABNORMAL HIGH (ref 38–126)
Anion gap: 15 (ref 5–15)
BUN: 23 mg/dL (ref 8–23)
CO2: 20 mmol/L — ABNORMAL LOW (ref 22–32)
Calcium: 10 mg/dL (ref 8.9–10.3)
Chloride: 94 mmol/L — ABNORMAL LOW (ref 98–111)
Creatinine, Ser: 0.91 mg/dL (ref 0.44–1.00)
GFR, Estimated: >60 mL/min (ref >60)
Glucose, Bld: 83 mg/dL (ref 70–99)
Potassium: 4.4 mmol/L (ref 3.5–5.1)
Sodium: 129 mmol/L — ABNORMAL LOW (ref 135–145)
Total Bilirubin: 0.4 mg/dL (ref 0.0–1.2)
Total Protein: 9 g/dL — ABNORMAL HIGH (ref 6.5–8.1)

## 2024-07-12 LAB — SAMPLE TO BLOOD BANK

## 2024-07-12 MED ORDER — INFLUENZA VIRUS VACC SPLIT PF (FLUZONE) 0.5 ML IM SUSY
0.5000 mL | PREFILLED_SYRINGE | Freq: Once | INTRAMUSCULAR | Status: AC
  Administered 2024-07-12: 0.5 mL via INTRAMUSCULAR

## 2024-07-12 MED ORDER — AMOXICILLIN-POT CLAVULANATE 875-125 MG PO TABS: 1.0000 | ORAL_TABLET | Freq: Two times a day (BID) | ORAL | 0 refills | Status: DC

## 2024-07-12 NOTE — Patient Instructions (Addendum)
 [facility] at [facility] Discharge Instructions   You were seen and examined today by Dr. Davonna.  She reviewed the results of your lab work which are normal/stable.   We will see you back in 2 months. We will repeat lab work and a CT scan prior to this visit.    Return as scheduled.    Thank you for choosing [facility] at [facility] to provide your oncology and hematology care.  To afford each patient quality time with our provider, please arrive at least 15 minutes before your scheduled appointment time.   If you have a lab appointment with the Cancer Center please come in thru the Main Entrance and check in at the main information desk.  You need to re-schedule your appointment should you arrive 10 or more minutes late.  We strive to give you quality time with our providers, and arriving late affects you and other patients whose appointments are after yours.  Also, if you no show three or more times for appointments you may be dismissed from the clinic at the providers discretion.     Again, thank you for choosing [facility].  Our hope is that these requests will decrease the amount of time that you wait before being seen by our physicians.       _____________________________________________________________  Should you have questions after your visit to [facility], please contact our office at [phone] and follow the prompts.  Our office hours are 8:00 a.m. and 4:30 p.m. Monday - Friday.  Please note that voicemails left after 4:00 p.m. may not be returned until the following business day.  We are closed weekends and major holidays.  You do have access to a nurse 24-7, just call the main number to the clinic [phone] and do not press any options, hold on the line and a nurse will answer the phone.    For prescription refill requests, have your pharmacy contact our office and allow 72 hours.    Due to  Covid, you will need to wear a mask upon entering the hospital. If you do not have a mask, a mask will be given to you at the Main Entrance upon arrival. For doctor visits, patients may have 1 support person age 4 or older with them. For treatment visits, patients can not have anyone with them due to social distancing guidelines and our immunocompromised population.

## 2024-07-12 NOTE — Progress Notes (Signed)
 " Patient Care Team: [name], MD as PCP - General Mallipeddi, [name], MD as PCP - Cardiology (Cardiology)  Clinic Day:  07/12/2024  Referring physician: [name], MD   CHIEF COMPLAINT:  CC: Metastatic clear cell RCC   ASSESSMENT & PLAN:   Assessment & Plan: Deanna Bush  is a 63 y.o. female with metastatic clear cell RCC  Assessment and Plan Assessment & Plan Metastatic right renal cell carcinoma Metastatic renal cell carcinoma s/p lines of treatment Oncology history as below Patient currently on Tivozanib and reports no complaints from it   - Patient denies bleeding or hypertension. - Continue taking Tivozanib 1.34mg  daily 3 weeks on/1 week off  - Urine dipstick showed a protein of 2+.  But this is baseline for patient. 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  - Will order blood work to assess hemoglobin levels. - Schedule a transfusion if hemoglobin is low.  Jaw abscess Likely related to a previously recommended root canal.   -Will prescribe Augmentin  10 for the jaw abscess. - Will refer to a dentist for evaluation and possible drainage.  Vitamin B12 deficiency Patient has mild vitamin B12 deficiency with levels less than 400  - Start oral vitamin B12 1000 mcg daily   The patient understands the plans discussed today and is in agreement with them.  She knows to contact our office if she develops concerns prior to her next appointment.  The total time spent in the appointment was 30 minutes for the encounter  with patient, including review of chart and various tests results, discussions about plan of care and coordination of care plan  Mickiel Dry, MD  [facility] [place] - A DEPT OF [name] [address] [place] [address] Dept: [phone] Dept Fax: [phone]   Orders Placed This Encounter  Procedures   CT CHEST ABDOMEN PELVIS W CONTRAST    Standing Status:   Future    Expected Date:   08/12/2024    Expiration Date:   07/12/2025    If indicated for the ordered procedure, I authorize the administration of contrast media per Radiology protocol:   Yes    Does the patient have a contrast media/X-ray dye allergy?:   No    Preferred imaging location?:   [facility]    If indicated for the ordered procedure, I authorize the administration of oral contrast media per Radiology protocol:   Yes     ONCOLOGY HISTORY:   I have reviewed her chart and materials related to her cancer extensively and collaborated history with the patient. Summary of oncologic history is as follows:   Metastatic clear cell RCC:  -12/23/2020: Presented to the ER with hematuria  -12/24/2020: CT renal stone study: Large right renal upper pole mass most consistent with Malignancy -01/02/2021: MRI abdomen: 12.9 cm in long axis right kidney upper pole tumor abuts the liver and diaphragm, and extends into the right renal pelvis where there is probable involvement of the right renal vein (but not the IVC). -02/06/2021: Kidney, adrenal, thrombus, right, radical nephrectomy: - Clear-cell renal cell carcinoma, nuclear grade 4, size 12.5 - Necrosis and rhabdoid features not identified -Tumor extends into and invades the wall of vena cava(pT3c)  -Vascular, ureteral  and all margins of resection are negative for tumor  -05/28/2021: CT abdomen:Several nodules along the peritoneal surface of the RIGHT nephrectomy bed warrant close attention on routine surveillance. -09/18/2021: CT abdomen: Progression nodularity within the RIGHT nephrectomy bed. Now with obvious enlarged enhancing nodules adjacent to the RIGHT hepatic lobe extending inferiorly along the RIGHT retroperitoneum with multiple enlarged enhancing  nodules.Direct metastatic invasion into the RIGHT hepatic lobe.  -10/03/2021: CT chest: There are several pulmonary nodules scattered throughout the lungs bilaterally, some of which could be indicative of metastatic disease to the lungs, however, many of these nodules appear to reflect areas of mucoid impaction, potentially from atypical infection. Close attention on follow-up studies is recommended to ensure the stability or regression of these lesions. - 11/14/2021: [facility] NGS: MSI-stable, pMMR, PD-L1: Negative 1+, 2%.TMB low, 3mut/Mb  - BRAF, NTRK 1/2/3, RET, BRCA1/2, FH, MET, MYCN, PALB2, SDHA, SDHB, SDHC, SDHD, SETD2: Negative - 10/22/2021: Kidney, right nephrectomy: Clear-cell renal cell carcinoma with necrosis  -11/19/2021-01/24/2022: Ipilimumab  and Nivolumab  x 4 cycles -02/14/2022: CT CAP:  Numerous new and enlarged pulmonary nodules throughout the lungs, the majority of which are clustered and concentrated in bronchiolar distributions. Findings are consistent with worsened pulmonary metastatic disease. Significant interval increase in now relatively bulky hypodense nodularity throughout the right retroperitoneum, hepatorenal recess, and right paracolic gutter, consistent with worsened soft tissue metastatic disease. New hypodense liver lesions, consistent with new hepatic metastatic disease. -03/13/2022-07/22/2023: Lenvatinib  12mg  and Everolimus  5mg  daily -05/20/2022, 09/09/2022, 01/01/2023: CT CAP: Stable to treatment response with decreased size of lesions in abdomen. -04/29/2023: CT CAP: Very extensive rim enhancing soft tissue nodularity throughout the right retroperitoneum, hepatorenal fossa, right paracolic gutter, and porta hepatis, as well as within the overlying right flank musculature. Some very slightly enlarged nodules inferiorly, as well as two new small nodules in the right erector spinae musculature, consistent with slightly worsened local disease. Multiple hypodense liver parenchymal  lesions are not significantly changed in size, however some of these are increasingly hypodense, suggesting treatment response of hepatic metastatic disease. No new liver lesions. -07/15/2023: CT CAP: Progressive extensive soft tissue nodularity in the right retroperitoneum, hepatorenal fossa, right pericolic gutter and porta hepatis extending into the musculature/soft tissues overlying the right flank. Increased burden of tumor nodularity or thrombus involving the IVC at the level of the right renal vein with increased burden of IVC thrombus versus mixing artifact of the IVC and extending down the right iliac veins. New pathologically enlarged prevascular lymph node measures 10 mm. Bilobar hypodense hepatic lesions again seen some of which have increased in size and others are stable. No new suspicious hepatic lesion identified. -07/29/2023-10/10/2023: Belzutifan  120mg  daily -09/29/2023: CT CAP: Massive increase in metastatic disease.  -10/20/2023-05/11/2024: Cabozantinib 40mg  daily -02/02/2024: CT CAP: Stable to mild improvement in all areas -04/22/2024: CT CAP: Findings are consistent with progression of disease in almost all areas. -05/14/2024-Current: Tivozanib 1.34mg  daily   Current Treatment:  Tivozanib 1.34mg  daily 3 weeks on/1 week off   INTERVAL HISTORY:   Discussed the use of AI scribe software for clinical note transcription with the patient, who gave verbal consent to proceed.  History of Present Illness Deanna Bush is a 63 year old female with kidney cancer who presents for follow-up of her treatment.  She is currently undergoing treatment for kidney cancer with tivozanib, taking 1.34 mg daily for three weeks followed by a one-week break. She is on her third cycle of this regimen. She experiences fatigue, which she attributes to the treatment.  No diarrhea or vomiting during this cycle. In the first cycle, she experienced diarrhea, which was managed with Imodium, but has not needed  it since.  She has an abscess on her jaw, which she believes is related to a dental issue identified six months ago when she was advised to have a root canal. She has not yet seen a dentist for this issue.  She reports fatigue and thinks she has low hemoglobin, and she has received blood transfusions in the past.  Her thyroid  levels were previously elevated, and she has communicated with her endocrinologist about this.    I have reviewed the past medical history, past surgical history, social history and family history with the patient and they are unchanged from previous note.  ALLERGIES:  is allergic to amlodipine , sulfonamide derivatives, and bee venom.  MEDICATIONS:  Current Outpatient Medications  Medication Sig Dispense Refill   acetaminophen  (TYLENOL ) 500 MG tablet Take 500 mg by mouth every 6 (six) hours as needed for moderate pain or headache.     atorvastatin  (LIPITOR) 40 MG tablet Take 1 tablet (40 mg total) by mouth daily. 30 tablet 1   busPIRone  (BUSPAR ) 5 MG tablet Take one tablet by mouth twice  daily as needed, for anxiety 60 tablet 3   EPINEPHrine  0.3 mg/0.3 mL IJ SOAJ injection Inject 0.3 mg into the muscle as needed for anaphylaxis. 2 each 0   furosemide  (LASIX ) 20 MG tablet TAKE 1 TABLET(20 MG) BY MOUTH DAILY AS NEEDED 90 tablet 1   HYDROcodone -acetaminophen  (NORCO) 5-325 MG tablet Take 1 tablet by mouth every 6 (six) hours as needed for moderate pain (pain score 4-6). 30 tablet 0   levothyroxine  (SYNTHROID ) 125 MCG tablet TAKE 1 TABLET(125 MCG) BY MOUTH DAILY BEFORE BREAKFAST 90 tablet 1   magnesium  oxide (MAG-OX) 400 (240 Mg) MG tablet Take 1 tablet (400 mg total) by mouth daily. 30 tablet 4   Multiple Vitamin (MULTIVITAMIN WITH MINERALS) TABS tablet Take 1 tablet by mouth daily.     olmesartan -hydrochlorothiazide (BENICAR  HCT) 20-12.5 MG tablet Take 1 tablet by mouth daily. 30 tablet 5   tivozanib hcl (FOTIVDA ) 1.34 MG capsule Take 1 capsule (1.34 mg total) by mouth  daily. Take for 21 days on, then off for 7 days. Repeat every 28 days. 21 capsule 3   UNABLE TO FIND 6 mastectomy prosthesis and bras. 6 each 0   UNABLE TO FIND Take 237 mLs by mouth in the morning and at bedtime. Med Name: Ensure Plus. DX : E46 and C64 72 each 3   urea  (CARMOL) 10 % cream Apply topically 2 (two) times daily. Apply twice daily to hands 71 g 0   No current facility-administered medications for this visit.   Facility-Administered Medications Ordered in Other Visits  Medication Dose Route Frequency Provider Last Rate Last Admin   0.9 %  sodium chloride  infusion (Manually program via Guardrails IV Fluids)  250 mL Intravenous Continuous Mishael Krysiak, MD   Stopped at 06/11/24 1240   heparin  lock flush 100 unit/mL  500 Units Intravenous Once Katragadda, Sreedhar, MD        REVIEW OF SYSTEMS:   Constitutional: Denies fevers, chills or abnormal weight loss Eyes: Denies blurriness of vision Ears, nose, mouth, throat, and face: Denies mucositis or sore throat Respiratory: Denies cough, dyspnea or wheezes Cardiovascular: Denies palpitation, chest discomfort or lower extremity swelling Gastrointestinal:  Denies nausea, heartburn or change in bowel habits Skin: Denies abnormal skin rashes Lymphatics: Denies new lymphadenopathy or easy  bruising Neurological:Denies numbness, tingling or new weaknesses Behavioral/Psych: Mood is stable, no new changes  All other systems were reviewed with the patient and are negative.   VITALS:  Blood pressure (!) 148/90, pulse 94, temperature (!) 96.6 F (35.9 C), temperature source Tympanic, resp. rate 18, weight 143 lb 1.3 oz (64.9 kg), SpO2 99%.  Wt Readings from Last 3 Encounters:  07/12/24 143 lb 1.3 oz (64.9 kg)  05/11/24 146 lb 13.2 oz (66.6 kg)  04/22/24 147 lb 14.9 oz (67.1 kg)    Body mass index is 23.09 kg/m.  Performance status (ECOG): 1 - Symptomatic but completely ambulatory  PHYSICAL EXAM:   GENERAL:alert, no distress and  comfortable SKIN: Swelling in the left mandibular area, warmth, tender.  Nonfluctuant. LYMPH:  no palpable lymphadenopathy in the cervical, axillary or inguinal LUNGS: clear to auscultation and percussion with normal breathing effort HEART: regular rate & rhythm and no murmurs and no lower extremity edema ABDOMEN:abdomen soft, non-tender and normal bowel sounds Musculoskeletal:no cyanosis of digits and no clubbing  NEURO: alert & oriented x 3 with fluent speech, no focal motor/sensory deficits  LABORATORY DATA:  I have reviewed the data as listed  Lab Results  Component Value Date   WBC 8.3 06/10/2024   NEUTROABS 6.3 06/10/2024   HGB 7.9 (L) 06/10/2024   HCT 25.2 (L) 06/10/2024   MCV 99.6 06/10/2024   PLT 369 06/10/2024      Chemistry      Component Value Date/Time   NA 128 (L) 06/10/2024 1013   NA 133 (L) 03/23/2024 1018   K 4.3 06/10/2024 1013   CL 93 (L) 06/10/2024 1013   CO2 23 06/10/2024 1013   BUN 24 (H) 06/10/2024 1013   BUN 28 (H) 03/23/2024 1018   CREATININE 1.02 (H) 06/10/2024 1013   CREATININE 0.73 04/22/2019 0848      Component Value Date/Time   CALCIUM  9.2 06/10/2024 1013   ALKPHOS 124 06/10/2024 1013   AST 19 06/10/2024 1013   ALT 9 06/10/2024 1013   BILITOT 0.6 06/10/2024 1013   BILITOT 0.3 03/23/2024 1018      Latest Reference Range & Units 06/10/24 10:13 06/11/24 09:04  Iron  28 - 170 ug/dL  27 (L)  UIBC ug/dL  810  TIBC 749 - 549 ug/dL  783 (L)  Saturation Ratios 10.4 - 31.8 %  13  Ferritin 11 - 307 ng/mL  1,017 (H)  Folate >5.9 ng/mL 7.4   (L): Data is abnormally low (H): Data is abnormally high   Latest Reference Range & Units 06/10/24 10:12  Vitamin B12 180 - 914 pg/mL 213    Latest Reference Range & Units 06/10/24 10:13  TSH 0.350 - 4.500 uIU/mL 15.637 (H)  (H): Data is abnormally high   Latest Reference Range & Units 06/11/24 09:04  Appearance CLEAR  HAZY !  Bilirubin Urine NEGATIVE  NEGATIVE  Color, Urine YELLOW  YELLOW  Glucose,  UA NEGATIVE mg/dL NEGATIVE  Hgb urine dipstick NEGATIVE  NEGATIVE  Ketones, ur NEGATIVE mg/dL NEGATIVE  Leukocytes,Ua NEGATIVE  SMALL !  Nitrite NEGATIVE  NEGATIVE  pH 5.0 - 8.0  5.0  Protein NEGATIVE mg/dL 899 !  Specific Gravity, Urine 1.005 - 1.030  1.017  !: Data is abnormal  RADIOGRAPHIC STUDIES: I have personally reviewed the radiological images as listed and agreed with the findings in the report.  CT CHEST ABDOMEN PELVIS W CONTRAST CLINICAL DATA:  Metastatic clear cell renal cell carcinoma, right nephrectomy 02/06/2021, and additional history left  breast cancer. Current treatment status, if any, is unspecified.  EXAM: CT CHEST, ABDOMEN, AND PELVIS WITH CONTRAST  TECHNIQUE: Multidetector CT imaging of the chest, abdomen and pelvis was performed following the standard protocol during bolus administration of intravenous contrast.  RADIATION DOSE REDUCTION: This exam was performed according to the departmental dose-optimization program which includes automated exposure control, adjustment of the mA and/or kV according to patient size and/or use of iterative reconstruction technique.  CONTRAST:  OMNIPAQUE  IOHEXOL  300 MG/ML  SOLN  COMPARISON:  Chest, abdomen and pelvis CTs with contrast 02/02/2024, 09/29/2023.  FINDINGS: CT CHEST FINDINGS  Cardiovascular: Right chest port with IJ approach catheter terminating about the superior cavoatrial junction. The cardiac size is normal. Small anterior pericardial effusion again is noted.  Anterior to apex of the right ventricle there is a 1.6 x 1.7 cm mass in the pericardial sac, previously 1.5 x 1.3 cm on the last CT and not seen on 09/29/2023, which impresses into or invades the right ventricular apex, best seen on 2:40 and consistent with metastatic disease.  There is atherosclerosis in the aortic arch and great vessels without aneurysm, stenosis or dissection. Pulmonary arteries are normal caliber and centrally  clear.  The central pulmonary veins are normal in caliber. First order perihilar tributaries of the left superior and right inferior pulmonary veins are encased and narrowed by adenopathy, but this was seen previously.  Mediastinum/Nodes: There is multifocal necrotic adenopathy, seen previously.  There is an index AP window lymph node measuring 2.2 x 3.0 cm on 2:21, previously 2.8 x 1.9 cm.  Subcarinal lymph node to the right today measuring 3.2 x 2.1 cm on 2:26, previously 3.2 x 1.9 cm.  Right hilar index lymph node is 2.2 x 1.9 cm on 2:31, was previously 1.8 x 1.6 cm.  No new adenopathy is seen. There are left axillary surgical clips. No axillary adenopathy is seen. The trachea, central airways, thoracic esophagus unremarkable.  Lungs/Pleura: Moderate right small left layering pleural effusions, both slightly increased.  Irregular pleural base mass anteriorly left upper lobe, consistent with metastasis, today is 2.8 x 3.8 cm on 3:62, previously 1.6 x 1.3 cm. The  Pleural-based masses in the more cephalad left upper lobe, with anterior mass measuring 2.5 x 1.7 cm on 3:26, previously 2.4 x 1.5 cm, another noted laterally at this level is 2.9 x 1.9 cm on 3:27, previously 2.8 x 1.5 cm.  Several scattered smaller nodules are unchanged. Branching opacities are again seen in the right middle lobe base consistent with small airway impactions.  Musculoskeletal: There is a large expansile mass with heterogeneous calcifications, arising from the posterior right twelfth rib infiltrating into the chest wall and the underlying retroperitoneal perihepatic space. This is 9.9 x 5.2 cm on 2:57, previously 9 x 5 cm.  Similar but smaller destructive lesion of the posterior left tenth rib today is 3.9 x 2.1 cm on 2:40, formerly 3.6 x 1.6 cm. No other destructive thoracic bone lesions are seen.  CT ABDOMEN PELVIS FINDINGS  Hepatobiliary: Extensive metastatic disease to the  liver.  There is a segment 5 conglomerate mass protruding into the porta with dystrophic calcifications, when measured in a similar fashion as on the prior study, today is 7.4 x 8.2 cm on 2:55, was previously 7.5 x 7.5 cm.  Index mass in segment 4 B is 5.6 x 4.4 cm on 2:60, previously 4.8 x 3.9 cm.  There is a coalescent mass in segment 6, measuring 8.7 x 7.3 cm on  2:63, previously 8.6 x 7.3 cm.  There is bulky porta hepatis adenopathy, scattered dystrophic calcifications within this, and increased mass effect narrowing the main portal vein to a pencil thin slit incompletely effaces the intrahepatic and extrahepatic IVC, extending from the dorsal liver surface to the spine and continuing left of the midline.  Pancreas: Rim enhancing centrally necrotic mass inseparable from the anterior pancreatic neck, previously 2.9 x 1.7 cm today measuring 3.0 x 2.2 cm. This is probably abutting adenopathy rather than a primary pancreatic neoplasm. Rest of the pancreas is unremarkable.  Spleen: No abnormality.  Adrenals/Urinary Tract: Status post right nephrectomy. Right renal fossa is filled with: Less intact knob a 3, as before.  The right adrenal probably has been previously removed. Left adrenal and left kidney are unremarkable.  There is mild thickening of the bladder which could be due to cystitis or underdistention. Please correlate clinically.  Stomach/Bowel: The duodenum is effaced by the extensive porta hepatis adenopathy. There is no gastric dilatation. No small bowel obstruction is seen.  An appendix is not seen. There are thickened folds in multiple small bowel segments which is probably congestive, versus enteritis. The colon wall has a normal thickness.  Vascular/Lymphatic: Aortic atherosclerosis. As above there is mass effect increasingly severely narrowing the hepatic portal vein and effacing the IVC.  Right pelvic venous congestion is again noted and engorged  venous collateral flow from the left renal vein to the paraspinal area.  In addition to extensive porta hepatis adenopathy, there is multifocal left para-aortic chain adenopathy, largest of these 1.9 cm short axis on 2:60, previously 1.7 cm.  A partially necrotic pelvic mass eccentric to the right is 5 x 5.6 cm on 2:96, was 5.5 x 4.6 cm.  Enlarged right external iliac chain node today is 2.9 x 2.5 cm, previously 2.6 x 2.2 cm.  Reproductive: Uterus and bilateral adnexa are unremarkable. Multiple pelvic phleboliths.  Other: There is mild ascites in the abdomen and pelvis, mesenteric congestion, and increased body wall anasarca.  Centrally necrotic mass impresses into the right psoas and measures 7.4 x 6.1 cm on 2:77, previously 7 x 6.4 cm.  Right paraspinal musculature also demonstrates tumor invasion at the L1 and L2 level, similar to last study.  Musculoskeletal: No regional destructive bone lesion is seen.  IMPRESSION: 1. Findings are consistent with progression of disease. 2. 1.6 x 1.7 cm mass in the pericardial sac anterior to the right ventricular apex and could be invading it, previously 1.5 x 1.3 cm on the last CT and not seen on 09/29/2023, consistent with metastatic disease. 3. Increased size of the pleural-based mass anteriorly in the left upper lobe, with other pleural-based masses in the left upper lobe similar to previous. 4. Increased size of the large expansile mass arising from the posterior right twelfth rib infiltrating into the chest wall and underlying retroperitoneal perihepatic space. 5. Increased size of the destructive lesion of the posterior left tenth rib. 6. Increased size of the extensive metastatic disease to the liver, with increased mass effect narrowing the main portal vein to a pencil thin slit and effacing the IVC. 7. Right pelvic venous congestion and engorged venous collateral flow from the left renal vein to the paraspinal area. 8.  Increased size of the bulky porta hepatis adenopathy, with increased size of left para-aortic chain adenopathy, right external iliac chain adenopathy, and pelvic mass. 9. Increased ascites, mesenteric congestion, and increased body wall anasarca. 10. Thickened folds in multiple small bowel segments probably congestive,  versus enteritis. No bowel obstruction. 11. Mild thickening of the bladder which could be due to underdistention or cystitis. 12. Aortic atherosclerosis.  Aortic Atherosclerosis (ICD10-I70.0).  Electronically Signed   By: Francis Quam M.D.   On: 04/29/2024 03:57    "

## 2024-07-13 ENCOUNTER — Ambulatory Visit: Admitting: Family Medicine

## 2024-07-14 ENCOUNTER — Ambulatory Visit (HOSPITAL_COMMUNITY): Payer: Medicare (Managed Care) | Admitting: Psychiatry

## 2024-07-14 DIAGNOSIS — F4323 Adjustment disorder with mixed anxiety and depressed mood: Secondary | ICD-10-CM

## 2024-07-14 NOTE — Progress Notes (Signed)
 Virtual Visit via Telephone Note  I connected with Deanna Bush on 07/14/24 at 1:07 PM EDT  by telephone and verified that I am speaking with the correct person using two identifiers.  Location: Patient: Home Provider: [facility] Out[facility] [place] office    I discussed the limitations, risks, security and privacy concerns of performing an evaluation and management service by telephone and the availability of in person appointments. I also discussed with the patient that there may be a patient responsible charge related to this service. The patient expressed understanding and agreed to proceed.    I provided 16 minutes of non-face-to-face time during this encounter.   Winton FORBES Rubinstein, LCSW   IN-PERSON  THERAPIST PROGRESS NOTE  Session Time:  Wednesday  07/14/2024 1:07 PM -  1:23 PM  Participation Level: Active  Behavioral Response: CasualAlert/less anxious  Type of Therapy: Individual Therapy  Treatment Goals addressed: Have healthy grieving process around loss Begin verbalizing feelings associated with loss     ProgressTowards Goals: Progressing  Interventions: Supportive/CBT  Summary: Deanna Bush is a 63 y.o. female who is referred for services by PCP Dr. Antonetta due to pt experiencing symptoms of anxiety along with grief and loss issues. Pt denies any psychiatric hospitalizations. Pt has no previous involvement in outpatient therapy.  Per patient's report her stepdaughter was diagnosed with colon cancer at age 46 and died in 10/29/2022.  Patient was her caretaker and says patient died at her home.  Patient reports her husband left her about 2 weeks ago for 79 year old after 30 years of marriage.  Patient reports additional stress regarding providing care for her 67 year old mother.  Patient also has her own health issues as she was diagnosed with kidney cancer 2 years ago and had chemo.  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 She currently is  taking experimental drugs.  Patient reports crying spells, sadness, irritability, worry, anxiety, and sleeping difficulty.   Patient last was seen about 4 weeks ago.  She reports continued issues regarding her heath. Per her report she was very sick experiencing dehydration, nausea, and weakness for a 2 week period. She also had to have a blood transfusion. She is better now but still experiences fatigue, weakness, and shortness of breath. She is working with new oncologist and reports continuing treatment. She is pleased with relationship with new oncologist. She still  is hopeful about living at least until her birthday on [birth year] and states taking one day at a time. She reports continued strong support from her family and participates in socialization/activities as she is able. She reports less worry about the welfare of her sons as she had a discussion with sons about the future and her concerns. She reports feeling much better about this and says she is no longer worried about sons. She states they have assured her they will be all right. She also is please sons are now attending church with her regularly.  Suicidal/Homicidal: Nowithout intent/plan  Therapist Response: Reviewed symptoms, discussed stressors, facilitated expression of thoughts and feelings, validated feelings, praised and reinforced patient's efforts to have conversation with her sons, discussed effects, praised and reinforced patient's continued involvement in activity/socialization as she is able, encouraged patient to continue to use support system and engage in activities within her capability, ended session early as patient was not feeling well.   Diagnosis: Adjustment disorder with mixed anxiety and depressed mood  Collaboration of Care: Primary Care Provider AEB  patient is seeing PCP Dr. Rollene Pesa for medication management  Patient/Guardian was advised Release of Information must be obtained prior to any record release in  order to collaborate their care with an outside provider. Patient/Guardian was advised if they have not already done so to contact the registration department to sign all necessary forms in order for us  to release information regarding their care.   Consent: Patient/Guardian gives verbal consent for treatment and assignment of benefits for services provided during this visit. Patient/Guardian expressed understanding and agreed to proceed.   Winton FORBES Rubinstein, LCSW10/05/2024

## 2024-07-24 ENCOUNTER — Other Ambulatory Visit: Payer: Self-pay | Admitting: Oncology

## 2024-07-26 ENCOUNTER — Other Ambulatory Visit: Payer: Self-pay | Admitting: *Deleted

## 2024-07-26 MED ORDER — AMOXICILLIN-POT CLAVULANATE 875-125 MG PO TABS
1.0000 | ORAL_TABLET | Freq: Two times a day (BID) | ORAL | 0 refills | Status: AC
Start: 2024-07-26 — End: ?

## 2024-07-26 NOTE — Telephone Encounter (Signed)
 I spoke with Deanna Bush at [facility].  They received a RX for only 7 pills / 3.5 days worth of the Augmentin .  I talked with Dr. Davonna and she wanted patient to take 1 pill BID for 7 days.  Patient states that she just started taking them on Saturday.I gave Deanna a verbal okay to refill and she requested another script to be sent in for the extra 7 pills needed.  Patient is aware that pharmacy has new script and she will pick it up.

## 2024-07-28 ENCOUNTER — Other Ambulatory Visit: Payer: Self-pay

## 2024-07-28 ENCOUNTER — Other Ambulatory Visit (HOSPITAL_COMMUNITY): Payer: Self-pay

## 2024-07-28 ENCOUNTER — Telehealth: Payer: Self-pay | Admitting: Pharmacy Technician

## 2024-07-28 ENCOUNTER — Ambulatory Visit (HOSPITAL_COMMUNITY): Payer: Medicare (Managed Care) | Admitting: Psychiatry

## 2024-07-28 DIAGNOSIS — Z0389 Encounter for observation for other suspected diseases and conditions ruled out: Secondary | ICD-10-CM

## 2024-07-28 NOTE — Telephone Encounter (Signed)
 Oral Oncology Patient Advocate Encounter  Was successful in securing patient a $8000 grant from [facility] to provide copayment coverage for Fotivda .  This will keep the out of pocket expense at $0.     [employer] ID: [id number]   The billing information is as follows and has been shared with [facility].    RxBin: [id number] PCN: [id number] Member ID: [id number] Group ID: [id number] Dates of Eligibility: 06/28/2024 through 06/27/2025  Fund:  Renal Cell Carcinoma - Medicare Access  [place] ([name]) [name], CPhT  [facility], [name], [name] - Oral Chemotherapy Patient Advocate Specialist III Phone: [phone]  Fax: [phone]

## 2024-07-28 NOTE — Progress Notes (Unsigned)
 Pt cancelled appt less than 24 hours but did so as a result of being unable to meet insurance requirement regarding in office appt, was not able to access transportation at last minute notice.

## 2024-07-30 ENCOUNTER — Other Ambulatory Visit: Payer: Self-pay | Admitting: Pharmacy Technician

## 2024-07-30 ENCOUNTER — Other Ambulatory Visit (HOSPITAL_COMMUNITY): Payer: Self-pay

## 2024-07-30 ENCOUNTER — Other Ambulatory Visit: Payer: Self-pay

## 2024-07-30 NOTE — Progress Notes (Signed)
 Oral Oncology Patient Advocate Encounter  Lorrene has been obtained. MPPP not recommended since there is only ~2 months left in the year.  See other encounter by me for further grant details.  Kiondre Grenz ([name]) [name], CPhT  [facility] [name], [name] - Oral Chemotherapy Patient Advocate Specialist III Phone: [phone]  Fax: [phone]

## 2024-08-02 ENCOUNTER — Other Ambulatory Visit: Payer: Self-pay

## 2024-08-03 ENCOUNTER — Other Ambulatory Visit (HOSPITAL_COMMUNITY): Payer: Self-pay

## 2024-08-03 ENCOUNTER — Telehealth: Payer: Self-pay | Admitting: Pharmacist

## 2024-08-03 ENCOUNTER — Other Ambulatory Visit: Payer: Self-pay

## 2024-08-03 NOTE — Progress Notes (Signed)
 Specialty Pharmacy Refill Coordination Note  Deanna Bush is a 63 y.o. female contacted today regarding refills of specialty medication(s) Tivozanib HCl (FOTIVDA )   Patient requested Delivery   Delivery date: 08/05/24   Verified address: [address]  [place] [address]   Medication will be filled on: 08/04/24

## 2024-08-05 NOTE — Telephone Encounter (Signed)
 Received a message from [employer] (Specialty) tech Velma that patient was request more medication calendar months. Attempted call to patient on 10/28 and 10/30 to verify where she is at in her medication schedule so her calendar will be accurate. Unable to reach patient, LVMs.

## 2024-08-12 ENCOUNTER — Ambulatory Visit (HOSPITAL_COMMUNITY)
Admission: RE | Admit: 2024-08-12 | Discharge: 2024-08-12 | Disposition: A | Discharge status: Home / Self Care | Payer: Medicare (Managed Care) | Source: Ambulatory Visit | Attending: Oncology | Admitting: Oncology

## 2024-08-12 ENCOUNTER — Inpatient Hospital Stay: Payer: Medicare (Managed Care) | Attending: Hematology

## 2024-08-12 ENCOUNTER — Inpatient Hospital Stay: Payer: Medicare (Managed Care)

## 2024-08-12 ENCOUNTER — Other Ambulatory Visit: Payer: Medicare (Managed Care)

## 2024-08-12 DIAGNOSIS — J90 Pleural effusion, not elsewhere classified: Secondary | ICD-10-CM | POA: Insufficient documentation

## 2024-08-12 DIAGNOSIS — E538 Deficiency of other specified B group vitamins: Secondary | ICD-10-CM | POA: Insufficient documentation

## 2024-08-12 DIAGNOSIS — D630 Anemia in neoplastic disease: Secondary | ICD-10-CM | POA: Insufficient documentation

## 2024-08-12 DIAGNOSIS — R220 Localized swelling, mass and lump, head: Secondary | ICD-10-CM | POA: Insufficient documentation

## 2024-08-12 DIAGNOSIS — C641 Malignant neoplasm of right kidney, except renal pelvis: Secondary | ICD-10-CM | POA: Insufficient documentation

## 2024-08-12 DIAGNOSIS — C771 Secondary and unspecified malignant neoplasm of intrathoracic lymph nodes: Secondary | ICD-10-CM | POA: Diagnosis not present

## 2024-08-12 DIAGNOSIS — C787 Secondary malignant neoplasm of liver and intrahepatic bile duct: Secondary | ICD-10-CM | POA: Diagnosis not present

## 2024-08-12 DIAGNOSIS — C7801 Secondary malignant neoplasm of right lung: Secondary | ICD-10-CM | POA: Diagnosis not present

## 2024-08-12 DIAGNOSIS — E039 Hypothyroidism, unspecified: Secondary | ICD-10-CM

## 2024-08-12 DIAGNOSIS — C7802 Secondary malignant neoplasm of left lung: Secondary | ICD-10-CM | POA: Insufficient documentation

## 2024-08-12 DIAGNOSIS — C7951 Secondary malignant neoplasm of bone: Secondary | ICD-10-CM | POA: Diagnosis not present

## 2024-08-12 LAB — CBC WITH DIFFERENTIAL/PLATELET
Abs Immature Granulocytes: 0.03 K/uL (ref 0.00–0.07)
Basophils Absolute: 0 K/uL (ref 0.0–0.1)
Basophils Relative: 0 %
Eosinophils Absolute: 0 K/uL (ref 0.0–0.5)
Eosinophils Relative: 1 %
HCT: 29.2 % — ABNORMAL LOW (ref 36.0–46.0)
Hemoglobin: 9.2 g/dL — ABNORMAL LOW (ref 12.0–15.0)
Immature Granulocytes: 1 %
Lymphocytes Relative: 18 %
Lymphs Abs: 1.1 K/uL (ref 0.7–4.0)
MCH: 29.8 pg (ref 26.0–34.0)
MCHC: 31.5 g/dL (ref 30.0–36.0)
MCV: 94.5 fL (ref 80.0–100.0)
Monocytes Absolute: 0.7 K/uL (ref 0.1–1.0)
Monocytes Relative: 11 %
Neutro Abs: 4.4 K/uL (ref 1.7–7.7)
Neutrophils Relative %: 69 %
Platelets: 393 K/uL (ref 150–400)
RBC: 3.09 MIL/uL — ABNORMAL LOW (ref 3.87–5.11)
RDW: 16.1 % — ABNORMAL HIGH (ref 11.5–15.5)
WBC: 6.3 K/uL (ref 4.0–10.5)
nRBC: 0 % (ref 0.0–0.2)

## 2024-08-12 LAB — COMPREHENSIVE METABOLIC PANEL WITH GFR
ALT: 13 U/L (ref 0–44)
AST: 79 U/L — ABNORMAL HIGH (ref 15–41)
Albumin: 3.2 g/dL — ABNORMAL LOW (ref 3.5–5.0)
Alkaline Phosphatase: 230 U/L — ABNORMAL HIGH (ref 38–126)
Anion gap: 13 (ref 5–15)
BUN: 18 mg/dL (ref 8–23)
CO2: 23 mmol/L (ref 22–32)
Calcium: 9.4 mg/dL (ref 8.9–10.3)
Chloride: 96 mmol/L — ABNORMAL LOW (ref 98–111)
Creatinine, Ser: 0.79 mg/dL (ref 0.44–1.00)
GFR, Estimated: >60 mL/min (ref >60)
Glucose, Bld: 79 mg/dL (ref 70–99)
Potassium: 4.1 mmol/L (ref 3.5–5.1)
Sodium: 132 mmol/L — ABNORMAL LOW (ref 135–145)
Total Bilirubin: 0.6 mg/dL (ref 0.0–1.2)
Total Protein: 8.6 g/dL — ABNORMAL HIGH (ref 6.5–8.1)

## 2024-08-12 LAB — SAMPLE TO BLOOD BANK

## 2024-08-12 LAB — MAGNESIUM: Magnesium: 1.8 mg/dL (ref 1.7–2.4)

## 2024-08-12 MED ORDER — HEPARIN SOD (PORK) LOCK FLUSH 100 UNIT/ML IV SOLN
500.0000 [IU] | Freq: Once | INTRAVENOUS | Status: AC
  Administered 2024-08-12: 500 [IU] via INTRAVENOUS

## 2024-08-12 MED ORDER — IOHEXOL 300 MG/ML  SOLN
100.0000 mL | Freq: Once | INTRAMUSCULAR | Status: AC | PRN
  Administered 2024-08-12: 100 mL via INTRAVENOUS

## 2024-08-12 NOTE — Progress Notes (Signed)
 Patients port flushed without difficulty.  Good blood return noted with no bruising or swelling noted at site. Labs drawn per orders. Port to be deaccessed by radiology due to patient having CT scan today.  VSS with discharge and left in satisfactory condition with no s/s of distress noted. All follow ups as scheduled.       Conny Moening

## 2024-08-17 ENCOUNTER — Other Ambulatory Visit: Payer: Self-pay

## 2024-08-17 NOTE — Progress Notes (Signed)
 Specialty Pharmacy Ongoing Clinical Assessment Note  Deanna Bush is a 63 y.o. female who is being followed by the specialty pharmacy service for RxSp Oncology   Patient's specialty medication(s) reviewed today: Tivozanib HCl (FOTIVDA )   Missed doses in the last 4 weeks: 0   Patient/Caregiver did not have any additional questions or concerns.   Therapeutic benefit summary: Patient is achieving benefit   Adverse events/side effects summary: No adverse events/side effects   Patient's therapy is appropriate to: Continue    Goals Addressed             This Visit's Progress    Slow Disease Progression   On track    Patient is on track. Patient will maintain adherence. Per Dr. Armanda office visit notes from 07/12/24, lab work remains stable and a CT is planned after 3 months of treatment have been completed.         Follow up: 3 months  Deanna Bush Specialty Pharmacist

## 2024-08-19 NOTE — Progress Notes (Signed)
 Patient Care Team: [name], MD as PCP - General Mallipeddi, [name], MD as PCP - Cardiology (Cardiology)  Clinic Day:  08/19/2024  Referring physician: [name], MD   CHIEF COMPLAINT:  CC: Metastatic clear cell RCC   ASSESSMENT & PLAN:   Assessment & Plan: Deanna Bush  is a 63 y.o. female with metastatic clear cell RCC  Assessment and Plan Assessment & Plan Metastatic right renal cell carcinoma Metastatic renal cell carcinoma s/p multiple lines of treatment Oncology history as below Patient currently on Tivozanib and reports no complaints from it   - We discussed the recent CT scan findings and detail together.  There is clear progression of disease in several areas including the large right-sided pleural effusion, increasing liver metastasis, bulky infiltrative tumor throughout the right nephrectomy bed, interval enlargement of osseous metastasis involving the posterior right 8th and 12th ribs -I received a call from Dr. 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 - Discussed next line of options with the patient.  Patient would benefit from a clinical trial at this point.  Will refer to [facility] for the same. - Discussed staying on pazopanib while we await clinical trial evaluation. - We discussed the side effects of pazopanib in detail.It commonly causes hypertension, diarrhea, nausea, fatigue, hair-color changes, and elevated liver enzymes, with less common but serious risks such as hepatotoxicity and QT prolongation. -In the pivotal [id number] trial for treatment-nave metastatic RCC, pazopanib improved progression-free survival but showed a median overall survival (OS) of ~22.9 months vs. 20.5 months with placebo but the difference was not status decline significant. 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 Anemia panel with high ferritin and TSAT of 13  -Continue to monitor for now  Jaw swelling Likely secondary to metastatic bone lesion.  - She was seen by Dr. Dannielle for palliative radiation for the spot if she has pain  Vitamin B12 deficiency Patient has mild vitamin B12 deficiency with levels less than 400  - Continue oral vitamin B12 1000 mcg daily  Right sided pleural effusion Patient has significant right-sided pleural effusion She is currently asymptomatic  - Will arrange for thoracentesis  Goals of care discussion She prefers no chest compressions or ventilator support due to non-curative cancer and potential suffering. - Documented do not resuscitate status in medical records.  The patient understands the plans discussed today and is in agreement with them.  She knows to contact our office if she develops concerns prior to her next appointment.  The total time spent in the appointment was 30 minutes for the encounter  with patient, including review of chart and various tests results, discussions about plan of care and coordination of care plan  Mickiel Dry, MD  [facility] [place] - A DEPT OF [name] [address] [place] [address] Dept: [phone] Dept Fax: [phone]   No orders of the defined types were placed in this encounter.    ONCOLOGY HISTORY:   I have reviewed her chart and materials related to her cancer extensively and collaborated history with the patient. Summary of oncologic history is as follows:   Metastatic clear cell RCC:  -12/23/2020: Presented to the ER with hematuria  -12/24/2020: CT renal stone  study: Large right renal upper pole mass most consistent with Malignancy -01/02/2021: MRI abdomen: 12.9 cm in long axis right kidney upper pole tumor abuts the liver and diaphragm, and extends into the  right renal pelvis where there is probable involvement of the right renal vein (but not the IVC). -02/06/2021: Kidney, adrenal, thrombus, right, radical nephrectomy: - Clear-cell renal cell carcinoma, nuclear grade 4, size 12.5 - Necrosis and rhabdoid features not identified -Tumor extends into and invades the wall of vena cava(pT3c)  -Vascular, ureteral and all margins of resection are negative for tumor  -05/28/2021: CT abdomen:Several nodules along the peritoneal surface of the RIGHT nephrectomy bed warrant close attention on routine surveillance. -09/18/2021: CT abdomen: Progression nodularity within the RIGHT nephrectomy bed. Now with obvious enlarged enhancing nodules adjacent to the RIGHT hepatic lobe extending inferiorly along the RIGHT retroperitoneum with multiple enlarged enhancing nodules.Direct metastatic invasion into the RIGHT hepatic lobe.  -10/03/2021: CT chest: There are several pulmonary nodules scattered throughout the lungs bilaterally, some of which could be indicative of metastatic disease to the lungs, however, many of these nodules appear to reflect areas of mucoid impaction, potentially from atypical infection. Close attention on follow-up studies is recommended to ensure the stability or regression of these lesions. - 11/14/2021: [facility] NGS: MSI-stable, pMMR, PD-L1: Negative 1+, 2%.TMB low, 3mut/Mb  - BRAF, NTRK 1/2/3, RET, BRCA1/2, FH, MET, MYCN, PALB2, SDHA, SDHB, SDHC, SDHD, SETD2: Negative - 10/22/2021: Kidney, right nephrectomy: Clear-cell renal cell carcinoma with necrosis  -11/19/2021-01/24/2022: Ipilimumab  and Nivolumab  x 4 cycles -02/14/2022: CT CAP:  Numerous new and enlarged pulmonary nodules throughout the lungs, the majority of which are clustered and concentrated in bronchiolar distributions. Findings are consistent with worsened pulmonary metastatic disease. Significant interval increase in now relatively bulky hypodense nodularity throughout the right  retroperitoneum, hepatorenal recess, and right paracolic gutter, consistent with worsened soft tissue metastatic disease. New hypodense liver lesions, consistent with new hepatic metastatic disease. -03/13/2022-07/22/2023: Lenvatinib  12mg  and Everolimus  5mg  daily -05/20/2022, 09/09/2022, 01/01/2023: CT CAP: Stable to treatment response with decreased size of lesions in abdomen. -04/29/2023: CT CAP: Very extensive rim enhancing soft tissue nodularity throughout the right retroperitoneum, hepatorenal fossa, right paracolic gutter, and porta hepatis, as well as within the overlying right flank musculature. Some very slightly enlarged nodules inferiorly, as well as two new small nodules in the right erector spinae musculature, consistent with slightly worsened local disease. Multiple hypodense liver parenchymal lesions are not significantly changed in size, however some of these are increasingly hypodense, suggesting treatment response of hepatic metastatic disease. No new liver lesions. -07/15/2023: CT CAP: Progressive extensive soft tissue nodularity in the right retroperitoneum, hepatorenal fossa, right pericolic gutter and porta hepatis extending into the musculature/soft tissues overlying the right flank. Increased burden of tumor nodularity or thrombus involving the IVC at the level of the right renal vein with increased burden of IVC thrombus versus mixing artifact of the IVC and extending down the right iliac veins. New pathologically enlarged prevascular lymph node measures 10 mm. Bilobar hypodense hepatic lesions again seen some of which have increased in size and others are stable. No new suspicious hepatic lesion identified. -07/29/2023-10/10/2023: Belzutifan  120mg  daily -09/29/2023: CT CAP: Massive increase in metastatic disease.  -10/20/2023-05/11/2024: Cabozantinib 40mg  daily -02/02/2024: CT CAP: Stable to mild improvement in all areas -04/22/2024: CT CAP: Findings are consistent with progression  of disease in almost all areas. -05/14/2024-Current: Tivozanib 1.34mg  daily - 08/12/2024: CT CAP: Interval enlargement of a right-sided pleural effusion, now large with associated atelectasis  or consolidation of the right lung. Interval enlargement of pleural and mediastinal nodal metastases. Interval enlargement of rim enhancing liver metastases. Bulky, infiltrative tumor throughout the right nephrectomy bed which is confluent with and indistinguishable from bulky upper abdominal lymphadenopathy, throughout the retroperitoneum and directly involving the adjacent organs and abdominal wall, all of which is slightly increased compared to prior examination. Metastatic implants in the right hemipelvis slightly increased in size. Interval enlargement of osseous metastases involving the posterior right eighth and twelfth ribs.   Current Treatment: Plan to start pazopanib  INTERVAL HISTORY:   Discussed the use of AI scribe software for clinical note transcription with the patient, who gave verbal consent to proceed.  History of Present Illness Deanna Bush is a 63 year old female with metastatic cancer who presents for follow-up for her RCC and discuss CT scan results.    She has metastatic cancer with involvement in the lungs, liver, and bones. Recent imaging revealed large lesions in the lungs and pleural effusion. No difficulty breathing and no significant pain, although there is mild discomfort in one area for which she is not taking medication.  She has been treated with five different medications, none of which have been effective. She is currently on Tivozanib that she took this morning.  Her appetite remains good, and she typically eats two boiled eggs, two pieces of bacon, and oatmeal for breakfast. She feels better recently, having walked from the parking lot to the clinic, and is still able to perform daily activities independently.  Socially, she lives with her two adult sons, aged 73 and  61. She is looking forward to the birth of her first great-grandchild in about six months.    I have reviewed the past medical history, past surgical history, social history and family history with the patient and they are unchanged from previous note.  ALLERGIES:  is allergic to amlodipine , sulfonamide derivatives, and bee venom.  MEDICATIONS:  Current Outpatient Medications  Medication Sig Dispense Refill   acetaminophen  (TYLENOL ) 500 MG tablet Take 500 mg by mouth every 6 (six) hours as needed for moderate pain or headache.     amoxicillin -clavulanate (AUGMENTIN ) 875-125 MG tablet Take 1 tablet by mouth 2 (two) times daily. 7 tablet 0   atorvastatin  (LIPITOR) 40 MG tablet Take 1 tablet (40 mg total) by mouth daily. 30 tablet 1   busPIRone  (BUSPAR ) 5 MG tablet Take one tablet by mouth twice  daily as needed, for anxiety 60 tablet 3   EPINEPHrine  0.3 mg/0.3 mL IJ SOAJ injection Inject 0.3 mg into the muscle as needed for anaphylaxis. 2 each 0   furosemide  (LASIX ) 20 MG tablet TAKE 1 TABLET(20 MG) BY MOUTH DAILY AS NEEDED 90 tablet 1   HYDROcodone -acetaminophen  (NORCO) 5-325 MG tablet Take 1 tablet by mouth every 6 (six) hours as needed for moderate pain (pain score 4-6). 30 tablet 0   levothyroxine  (SYNTHROID ) 125 MCG tablet TAKE 1 TABLET(125 MCG) BY MOUTH DAILY BEFORE BREAKFAST 90 tablet 1   magnesium  oxide (MAG-OX) 400 (240 Mg) MG tablet Take 1 tablet (400 mg total) by mouth daily. 30 tablet 4   Multiple Vitamin (MULTIVITAMIN WITH MINERALS) TABS tablet Take 1 tablet by mouth daily.     olmesartan -hydrochlorothiazide (BENICAR  HCT) 20-12.5 MG tablet Take 1 tablet by mouth daily. 30 tablet 5   tivozanib hcl (FOTIVDA ) 1.34 MG capsule Take 1 capsule (1.34 mg total) by mouth daily. Take for 21 days on, then off for 7 days. Repeat every 28  days. 21 capsule 3   UNABLE TO FIND 6 mastectomy prosthesis and bras. 6 each 0   UNABLE TO FIND Take 237 mLs by mouth in the morning and at bedtime. Med Name:  Ensure Plus. DX : E46 and C64 72 each 3   urea  (CARMOL) 10 % cream Apply topically 2 (two) times daily. Apply twice daily to hands 71 g 0   No current facility-administered medications for this visit.   Facility-Administered Medications Ordered in Other Visits  Medication Dose Route Frequency Provider Last Rate Last Admin   0.9 %  sodium chloride  infusion (Manually program via Guardrails IV Fluids)  250 mL Intravenous Continuous Xan Ingraham, MD   Stopped at 06/11/24 1240   heparin  lock flush 100 unit/mL  500 Units Intravenous Once Katragadda, Sreedhar, MD        REVIEW OF SYSTEMS:   Constitutional: Denies fevers, chills or abnormal weight loss Eyes: Denies blurriness of vision Ears, nose, mouth, throat, and face: Denies mucositis or sore throat Respiratory: Denies cough, dyspnea or wheezes Cardiovascular: Denies palpitation, chest discomfort or lower extremity swelling Gastrointestinal:  Denies nausea, heartburn or change in bowel habits Skin: Denies abnormal skin rashes Lymphatics: Denies new lymphadenopathy or easy bruising Neurological:Denies numbness, tingling or new weaknesses Behavioral/Psych: Mood is stable, no new changes  All other systems were reviewed with the patient and are negative.   VITALS:  There were no vitals taken for this visit.  Wt Readings from Last 3 Encounters:  07/12/24 143 lb 1.3 oz (64.9 kg)  05/11/24 146 lb 13.2 oz (66.6 kg)  04/22/24 147 lb 14.9 oz (67.1 kg)    There is no height or weight on file to calculate BMI.  Performance status (ECOG): 1 - Symptomatic but completely ambulatory  PHYSICAL EXAM:   GENERAL:alert, no distress and comfortable SKIN: Swelling in the left mandibular area, warmth, tender.  Nonfluctuant. LYMPH:  no palpable lymphadenopathy in the cervical, axillary or inguinal LUNGS: Decreased breath sounds in the right lower lobe, rest clear lung sounds.   HEART: regular rate & rhythm and no murmurs and no lower extremity  edema ABDOMEN:abdomen soft, non-tender and normal bowel sounds Musculoskeletal:no cyanosis of digits and no clubbing  NEURO: alert & oriented x 3 with fluent speech  LABORATORY DATA:  I have reviewed the data as listed  Lab Results  Component Value Date   WBC 6.3 08/12/2024   NEUTROABS 4.4 08/12/2024   HGB 9.2 (L) 08/12/2024   HCT 29.2 (L) 08/12/2024   MCV 94.5 08/12/2024   PLT 393 08/12/2024      Chemistry      Component Value Date/Time   NA 132 (L) 08/12/2024 0854   NA 133 (L) 03/23/2024 1018   K 4.1 08/12/2024 0854   CL 96 (L) 08/12/2024 0854   CO2 23 08/12/2024 0854   BUN 18 08/12/2024 0854   BUN 28 (H) 03/23/2024 1018   CREATININE 0.79 08/12/2024 0854   CREATININE 0.73 04/22/2019 0848      Component Value Date/Time   CALCIUM  9.4 08/12/2024 0854   ALKPHOS 230 (H) 08/12/2024 0854   AST 79 (H) 08/12/2024 0854   ALT 13 08/12/2024 0854   BILITOT 0.6 08/12/2024 0854   BILITOT 0.3 03/23/2024 1018      Latest Reference Range & Units 06/10/24 10:13 06/11/24 09:04  Iron  28 - 170 ug/dL  27 (L)  UIBC ug/dL  810  TIBC 749 - 549 ug/dL  783 (L)  Saturation Ratios 10.4 - 31.8 %  13  Ferritin 11 - 307 ng/mL  1,017 (H)  Folate >5.9 ng/mL 7.4   (L): Data is abnormally low (H): Data is abnormally high   Latest Reference Range & Units 06/10/24 10:12  Vitamin B12 180 - 914 pg/mL 213    Latest Reference Range & Units 06/10/24 10:13  TSH 0.350 - 4.500 uIU/mL 15.637 (H)  (H): Data is abnormally high   RADIOGRAPHIC STUDIES: I have personally reviewed the radiological images as listed and agreed with the findings in the report.  CT CHEST ABDOMEN PELVIS W CONTRAST CLINICAL DATA:  Metastatic disease evaluation, right renal cell carcinoma * Tracking Code: BO *  EXAM: CT CHEST, ABDOMEN, AND PELVIS WITH CONTRAST  TECHNIQUE: Multidetector CT imaging of the chest, abdomen and pelvis was performed following the standard protocol during bolus administration of intravenous  contrast.  RADIATION DOSE REDUCTION: This exam was performed according to the departmental dose-optimization program which includes automated exposure control, adjustment of the mA and/or kV according to patient size and/or use of iterative reconstruction technique.  CONTRAST:  OMNIPAQUE  IOHEXOL  300 MG/ML  SOLN  COMPARISON:  04/22/2024  FINDINGS: CT CHEST FINDINGS  Cardiovascular: Right chest port catheter. Aortic atherosclerosis. Normal heart size. No pericardial effusion.  Mediastinum/Nodes: Enlargement of heterogeneously enhancing metastatic lymph nodes, subcarinal node measuring 3.9 x 2.5 cm, previously 3.2 x 2.1 cm (series 3, image 26). Thyroid  gland, trachea, and esophagus demonstrate no significant findings.  Lungs/Pleura: Interval enlargement of left-sided pleural metastases, measuring up to 3.6 x 2.5 cm about the peripheral left apex, previously 2.6 x 1.8 cm (series 3, image 12). Interval enlargement of a right-sided pleural effusion, now large with associated atelectasis or consolidation of the right lung. Right hilar mass difficult to discretely measure (series 3, image 33)  Musculoskeletal: Left mastectomy with breast prosthesis. No acute osseous findings. Interval enlargement of large expansile masses arising from the posterior right eighth rib (series 5, image 220) and right twelfth rib as described below.  CT ABDOMEN PELVIS FINDINGS  Hepatobiliary: Numerous rim enhancing liver metastases, which have slightly enlarged, for example in the anterior right lobe of the liver measuring 4.0 x 3.6 cm, previously 3.2 x 2.7 cm (series 3, image 50). No gallstones, gallbladder wall thickening, or biliary dilatation.  Pancreas: Redemonstrated metastases which directly involve or essentially efface the proximal pancreas (series 3, image 60, 67). No pancreatic ductal dilatation or surrounding inflammatory changes.  Spleen: Normal in size without significant  abnormality.  Adrenals/Urinary Tract: Adrenal glands are unremarkable. Right nephrectomy. Bulky, infiltrative tumor throughout the nephrectomy bed, throughout the retroperitoneum and directly involving the adjacent organs and abdominal wall, tumor bulk slightly increased compared to prior examination, large component in the superior retroperitoneum and portacaval station, deflecting the adjacent liver and aorta by mass effect measuring 9.5 x 8.6 cm, previously 8.2 x 7.5 cm when measured similarly (series 3, image 56). Large component within the posterior right abdominal wall and flank soft tissues arising from or encasing the right twelfth rib measures 10.4 x 5.7 cm, previously 9.8 x 5.2 cm (series 3, image 62). Large component in the inferior right retroperitoneum and psoas musculature measures 7.9 x 6.7 cm, previously 7.4 x 6.1 cm (series 3, image 78). Left kidney normal without calculi or hydronephrosis. Bladder unremarkable.  Stomach/Bowel: Stomach is within normal limits. Appendix appears normal. No evidence of bowel wall thickening, distention, or inflammatory changes.  Vascular/Lymphatic: Aortic atherosclerosis. Inferior vena cava is effaced along its length secondary to tumor bulk. Numerous bulky nodal metastases  throughout the upper abdomen which are confluent with and indistinguishable from infiltrative soft tissue described above.  Reproductive: No mass or other abnormality.  Other: No abdominal wall hernia. Anasarca. Small volume ascites. Metastatic implants in the right hemipelvis slightly increased in size, measuring up to 6.2 x 4.7 cm, previously 5.6 x 5.0 cm (series 3, image 97).  Musculoskeletal: No acute osseous findings.  IMPRESSION: 1. Interval enlargement of a right-sided pleural effusion, now large with associated atelectasis or consolidation of the right lung. Interval enlargement of pleural and mediastinal nodal metastases. 2. Interval enlargement of  rim enhancing liver metastases. 3. Bulky, infiltrative tumor throughout the right nephrectomy bed which is confluent with and indistinguishable from bulky upper abdominal lymphadenopathy, throughout the retroperitoneum and directly involving the adjacent organs and abdominal wall, all of which is slightly increased compared to prior examination. 4. Metastatic implants in the right hemipelvis slightly increased in size. 5. Interval enlargement of osseous metastases involving the posterior right eighth and twelfth ribs. 6. Small volume ascites. 7. Constellation of findings consistent with interval progression of local recurrence and widespread metastatic disease.  Aortic Atherosclerosis (ICD10-I70.0).  Electronically Signed   By: Marolyn JONETTA Jaksch M.D.   On: 08/17/2024 07:32

## 2024-08-20 ENCOUNTER — Inpatient Hospital Stay (HOSPITAL_BASED_OUTPATIENT_CLINIC_OR_DEPARTMENT_OTHER): Payer: Medicare (Managed Care) | Admitting: Oncology

## 2024-08-20 VITALS — BP 133/80 | HR 83 | Temp 97.9°F | Resp 18 | Wt 145.0 lb

## 2024-08-20 DIAGNOSIS — C641 Malignant neoplasm of right kidney, except renal pelvis: Secondary | ICD-10-CM

## 2024-08-22 MED ORDER — PAZOPANIB HCL 200 MG PO TABS: 800.0000 mg | ORAL_TABLET | Freq: Every day | ORAL | 2 refills | Status: DC

## 2024-08-23 ENCOUNTER — Encounter (HOSPITAL_COMMUNITY): Payer: Self-pay

## 2024-08-23 ENCOUNTER — Telehealth: Payer: Self-pay | Admitting: Pharmacist

## 2024-08-23 ENCOUNTER — Telehealth: Payer: Self-pay | Admitting: Pharmacy Technician

## 2024-08-23 ENCOUNTER — Other Ambulatory Visit (HOSPITAL_COMMUNITY): Payer: Self-pay

## 2024-08-23 ENCOUNTER — Ambulatory Visit (HOSPITAL_COMMUNITY)
Admission: RE | Admit: 2024-08-23 | Discharge: 2024-08-23 | Disposition: A | Payer: Medicare (Managed Care) | Source: Ambulatory Visit | Attending: Physician Assistant

## 2024-08-23 ENCOUNTER — Ambulatory Visit (HOSPITAL_COMMUNITY)
Admission: RE | Admit: 2024-08-23 | Discharge: 2024-08-23 | Disposition: A | Discharge status: Home / Self Care | Payer: Medicare (Managed Care) | Source: Ambulatory Visit | Attending: Oncology | Admitting: Oncology

## 2024-08-23 VITALS — BP 124/64 | HR 92 | Temp 97.8°F | Resp 18

## 2024-08-23 DIAGNOSIS — R918 Other nonspecific abnormal finding of lung field: Secondary | ICD-10-CM | POA: Diagnosis not present

## 2024-08-23 DIAGNOSIS — C641 Malignant neoplasm of right kidney, except renal pelvis: Secondary | ICD-10-CM

## 2024-08-23 DIAGNOSIS — J90 Pleural effusion, not elsewhere classified: Secondary | ICD-10-CM | POA: Diagnosis present

## 2024-08-23 DIAGNOSIS — Z85528 Personal history of other malignant neoplasm of kidney: Secondary | ICD-10-CM | POA: Insufficient documentation

## 2024-08-23 MED ORDER — PAZOPANIB HCL 200 MG PO TABS
800.0000 mg | ORAL_TABLET | Freq: Every day | ORAL | 2 refills | Status: AC
  Filled 2024-08-24: qty 120, 30d supply, fill #0
  Filled 2024-09-16: qty 120, 30d supply, fill #1
  Filled 2024-10-20: qty 120, 30d supply, fill #2

## 2024-08-23 MED ORDER — PAZOPANIB HCL 200 MG PO TABS: 800.0000 mg | ORAL_TABLET | Freq: Every day | ORAL | 2 refills | Status: DC

## 2024-08-23 MED ORDER — LIDOCAINE HCL (PF) 2 % IJ SOLN
INTRAMUSCULAR | Status: AC
  Filled 2024-08-23: qty 10

## 2024-08-23 MED ORDER — LIDOCAINE HCL (PF) 2 % IJ SOLN
10.0000 mL | Freq: Once | INTRAMUSCULAR | Status: AC
  Administered 2024-08-23: 10 mL

## 2024-08-23 NOTE — Telephone Encounter (Signed)
 Oral Oncology Patient Advocate Encounter  After completing a benefits investigation, prior authorization for pazopanib is not required at this time through [facility].  Patient's copay is $0.  Max day supply of 30.   Jatavious Peppard ([name]) [name], CPhT  [facility], [name], [name] - Oral Chemotherapy Patient Advocate Specialist III Phone: [phone]  Fax: [phone]

## 2024-08-23 NOTE — Procedures (Signed)
 PROCEDURE SUMMARY:  Successful image-guided right thoracentesis. Yielded 700 milliliters of clear, yellow fluid. Patient tolerated procedure well. EBL < 1 mL. No immediate complications.  Specimen was not sent for labs. Post procedure CXR shows no pneumothorax.  Please see imaging section of Epic for full dictation.  Clotilda DELENA Hesselbach PA-C 08/23/2024 12:07 PM

## 2024-08-23 NOTE — Telephone Encounter (Deleted)
 Student Pharmacist Encounter   Patient Education I spoke with patient for overview of new oral chemotherapy medication: Votrient (pazopanib) for the treatment of Metastatic Right Clear Cell Renal Cell Carcinoma, planned duration until disease progression or unacceptable drug toxicity.   Treatment goal: Palliative  Counseled patient on administration, dosing, side effects, monitoring, drug-food interactions, safe handling, storage, and disposal. Patient will take:  - Votrient 800 mg orally once daily, on an empty stomach (at least 1 hour before or 2 hours after a meal).  Side effects include but not limited to:  - Diarrhea  - High blood pressure  - Hair color changes  - Fatigue  - Taste changes  Rare/Serious Side Effects:  - Liver toxicity  - Heart ef    Reviewed with patient importance of keeping a medication schedule and plan for any missed doses.  After discussion with patient *** patient barriers to medication adherence identified.   Distress evaluation: ***  Communication and Learning Assessment Primary learner: *** Barriers to learning: {Barriers to learning:33317} Preferred language: {Preferred Language:33318} Learning preferences: {Learning preference:33319}   *** voiced understanding and appreciation. All questions answered. Medication handout provided.  Provided patient with Oral Chemotherapy Navigation Clinic phone number. Patient knows to call the office with questions or concerns. Oral Chemotherapy Navigation Clinic will continue to follow.  Fredia Main, PharmD Candidate 2026 [facility]/DB/AP Oral Chemotherapy Navigation Clinic [phone]  08/23/2024 9:20 AM

## 2024-08-23 NOTE — Telephone Encounter (Signed)
 Drug interaction: Magnesium  oxide: Magnesium  oxide as  may decrease serum concentrations of pazopanib. Avoid the use of magnesium  oxide in combination with pazopanib whenever possible. Separate doses by several hours if antacid treatment is considered necessary

## 2024-08-23 NOTE — Telephone Encounter (Cosign Needed Addendum)
 Student Pharmacist Encounter   Received new prescription for  Votrient (pazopanib)  for the treatment of Metastatic Right Clear Cell Renal Cell Carcinoma with planned duration until disease progression or unacceptable drug toxicity.  CMP and CBC from 08/12/24 reviewed. Results were within normal limits except for a low RBC count, findings consistent with mild anemia, mild hyponatremia, and an elevated alkaline phosphatase level. Prescription dose and frequency assessed.   - Votrient 800 mg orally once daily, on an empty stomach (at least 1 hour before or 2 hours after a meal).   Current medication list in Epic reviewed, no DDIs with Votrient identified:  Evaluated chart and no patient barriers to medication adherence identified.   Prescription has been e-scribed to the [facility] for benefits analysis and approval.  Oral Oncology Clinic will continue to follow for insurance authorization, copayment issues, initial counseling and start date.   Fredia Main, PharmD Candidate 2026 [facility] Oral Chemotherapy Navigation Clinic [phone]  08/23/2024 9:51 AM

## 2024-08-23 NOTE — Progress Notes (Signed)
 Patient tolerated Right Thoracentesis procedure well today and 700 mL of clear yellow pleural fluid removed. Patient verbalized understanding of discharge instructions and ambulatory to xray at this time for post chest xray with no acute distress noted.

## 2024-08-24 ENCOUNTER — Other Ambulatory Visit: Payer: Self-pay | Admitting: Pharmacy Technician

## 2024-08-24 ENCOUNTER — Telehealth: Payer: Self-pay | Admitting: Pharmacy Technician

## 2024-08-24 ENCOUNTER — Telehealth: Payer: Self-pay | Admitting: *Deleted

## 2024-08-24 ENCOUNTER — Other Ambulatory Visit (HOSPITAL_COMMUNITY): Payer: Self-pay

## 2024-08-24 ENCOUNTER — Other Ambulatory Visit: Payer: Self-pay

## 2024-08-24 MED ORDER — PROCHLORPERAZINE MALEATE 10 MG PO TABS: 10.0000 mg | ORAL_TABLET | Freq: Four times a day (QID) | ORAL | 2 refills | Status: AC | PRN

## 2024-08-24 NOTE — Telephone Encounter (Signed)
 During telephone education today 08/24/2024, patient mentioned having teeth pulled at the beginning of November and still having bleeding issues. She goes to the dentist again today for evaluation. She also has a plan to have more teeth pulled after Thanksgiving.   Pazopanib is a medication that is held for risk of impaired wound healing. They recommend Withhold VOTRIENT for at least 1 week prior to elective surgery. Do not administer for at least 2 weeks following major surgery and until adequate wound healing.    Dr. Davonna informed of the above via secure chat and advised that patient is good to start therapy and I would ask her to hold off any further teeth pulling at this time.  Nurse Tomi spoke with patient to let them know the response from Dr. Davonna.

## 2024-08-24 NOTE — Progress Notes (Signed)
 Patient education documented in EPIC note on 08/24/24.

## 2024-08-24 NOTE — Telephone Encounter (Signed)
 Clinical Pharmacist Practitioner Encounter   [facility] (Specialty) will deliver medication to patient on 08/27/2024.  Patient knows not to start until 08/30/2024.    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   Treatment goal: Palliative  Counseled patient on administration, dosing, side effects, monitoring, drug-food interactions, safe handling, storage, and disposal. Patient will take 4 tablets (800 mg total) by mouth daily. Take on an empty stomach, one hour before or two hours after food. .  Side effects include but not limited to: diarrhea, hypertension, fatigue, nausea, decreased wbc/plt.   Diarrhea: patient knows to use loperamide as needed and call the office if they are having four or more loose stool per day Nausea: Patient reports not having antiemetic on hand at home, prescription sent for prochlorperazine  to patient's local pharmacy Hypertension: Patient reports checking her blood pressure daily at home, she will continue this practice and start writing down her readings.  Mailing patient blood pressure log.  Reviewed with patient importance of keeping a medication schedule and plan for any missed doses.  After discussion with patient no patient barriers to medication adherence identified.   Distress evaluation: Distress thermometer not completed during telephone call as patient has been on previous lines of therapy.    Communication and Learning Assessment Primary learner: Patient Barriers to learning: No barriers Preferred language: English Learning preferences: Listening Reading   Deanna Bush voiced understanding and appreciation. All questions answered. Medication handout provided.  Provided patient with Oral Chemotherapy Navigation  Clinic phone number. Patient knows to call the office with questions or concerns. Oral Chemotherapy Navigation Clinic will continue to follow.  Deanna Bush N. Axcel Horsch, PharmD, BCOP, CPP Hematology/Oncology Clinical Pharmacist [facility] Oral Chemotherapy Navigation Clinic [phone]  08/24/2024 9:46 AM

## 2024-08-24 NOTE — Telephone Encounter (Signed)
 Oral Oncology Patient Advocate Encounter  Patient successfully OnBoarded and drug education provided by pharmacist. Medication scheduled to be shipped on 11/20 for delivery on 11/21 from [facility] to patient's address. Patient also knows to call me at [phone] with any questions or concerns regarding receiving medication or if there is any unexpected change in co-pay.    Deanna Bush ([name]) [name], CPhT  [facility], [name], [name] - Oral Chemotherapy Patient Advocate Specialist III Phone: [phone]  Fax: [phone]

## 2024-08-24 NOTE — Progress Notes (Signed)
 Specialty Pharmacy Initial Fill Coordination Note  Deanna Bush is a 64 y.o. female contacted today regarding initial fill of specialty medication(s) PAZOPanib HCl (VOTRIENT)   Patient requested Delivery   Delivery date: 08/27/24   Verified address: [address] [place] [address]   Medication will be filled on: 08/26/24   Patient is aware of $0 copayment.    Cayetano Mikita ([name]) [name], CPhT  [name], [name], [name] - Oral Chemotherapy Patient Advocate Specialist III Phone: [phone]  Fax: [phone]

## 2024-08-24 NOTE — Telephone Encounter (Signed)
 Per Dr. Davonna, reached out to patient advising to start Pazopanib Monday.  Will be delivered this Friday.  Advised to hold off on any further dental procedures at this time.  Verbalized understanding.

## 2024-08-25 ENCOUNTER — Other Ambulatory Visit: Payer: Self-pay

## 2024-09-01 ENCOUNTER — Other Ambulatory Visit: Payer: Self-pay

## 2024-09-10 ENCOUNTER — Other Ambulatory Visit: Payer: Self-pay

## 2024-09-16 ENCOUNTER — Other Ambulatory Visit: Payer: Self-pay

## 2024-09-20 ENCOUNTER — Other Ambulatory Visit: Payer: Self-pay

## 2024-09-21 ENCOUNTER — Other Ambulatory Visit: Payer: Self-pay

## 2024-09-21 ENCOUNTER — Other Ambulatory Visit (HOSPITAL_COMMUNITY): Payer: Self-pay

## 2024-09-21 NOTE — Progress Notes (Signed)
 Specialty Pharmacy Refill Coordination Note  Deanna Bush is a 64 y.o. female contacted today regarding refills of specialty medication(s) PAZOPanib  HCl (VOTRIENT )   Patient requested Delivery   Delivery date: 09/28/24   Verified address: [address] [place] [address]   Medication will be filled on: 09/27/24

## 2024-09-21 NOTE — Progress Notes (Signed)
 Specialty Pharmacy Ongoing Clinical Assessment Note  Deanna Bush is a 63 y.o. female who is being followed by the specialty pharmacy service for RxSp Oncology   Patient's specialty medication(s) reviewed today: PAZOPanib  HCl (VOTRIENT )   Missed doses in the last 4 weeks: 0   Patient/Caregiver did not have any additional questions or concerns.   Therapeutic benefit summary: Unable to assess   Adverse events/side effects summary: No adverse events/side effects   Patient's therapy is appropriate to: Continue    Goals Addressed             This Visit's Progress    Slow Disease Progression       Patient is unable to be assessed as therapy was recently initiated. Patient will maintain adherence. Provider appt upcoming on 09/24/24          Follow up: 3 months  [name] M Jermiya Reichl Specialty Pharmacist

## 2024-09-23 ENCOUNTER — Ambulatory Visit: Admitting: "Endocrinology

## 2024-09-24 ENCOUNTER — Inpatient Hospital Stay: Payer: Medicare (Managed Care) | Attending: Hematology | Admitting: Oncology

## 2024-09-24 ENCOUNTER — Inpatient Hospital Stay: Payer: Medicare (Managed Care)

## 2024-09-24 ENCOUNTER — Inpatient Hospital Stay: Payer: Medicare (Managed Care) | Attending: Hematology

## 2024-09-24 VITALS — BP 108/61 | HR 85 | Temp 97.4°F | Resp 18

## 2024-09-24 VITALS — BP 103/54 | HR 89 | Temp 97.9°F | Resp 17

## 2024-09-24 DIAGNOSIS — E538 Deficiency of other specified B group vitamins: Secondary | ICD-10-CM | POA: Insufficient documentation

## 2024-09-24 DIAGNOSIS — K1379 Other lesions of oral mucosa: Secondary | ICD-10-CM | POA: Diagnosis not present

## 2024-09-24 DIAGNOSIS — D630 Anemia in neoplastic disease: Secondary | ICD-10-CM | POA: Diagnosis not present

## 2024-09-24 DIAGNOSIS — D649 Anemia, unspecified: Secondary | ICD-10-CM

## 2024-09-24 DIAGNOSIS — C787 Secondary malignant neoplasm of liver and intrahepatic bile duct: Secondary | ICD-10-CM | POA: Insufficient documentation

## 2024-09-24 DIAGNOSIS — Z66 Do not resuscitate: Secondary | ICD-10-CM | POA: Diagnosis not present

## 2024-09-24 DIAGNOSIS — E039 Hypothyroidism, unspecified: Secondary | ICD-10-CM | POA: Insufficient documentation

## 2024-09-24 DIAGNOSIS — C641 Malignant neoplasm of right kidney, except renal pelvis: Secondary | ICD-10-CM | POA: Insufficient documentation

## 2024-09-24 DIAGNOSIS — J910 Malignant pleural effusion: Secondary | ICD-10-CM | POA: Insufficient documentation

## 2024-09-24 DIAGNOSIS — J9811 Atelectasis: Secondary | ICD-10-CM | POA: Diagnosis not present

## 2024-09-24 DIAGNOSIS — C7989 Secondary malignant neoplasm of other specified sites: Secondary | ICD-10-CM | POA: Insufficient documentation

## 2024-09-24 DIAGNOSIS — Z905 Acquired absence of kidney: Secondary | ICD-10-CM | POA: Insufficient documentation

## 2024-09-24 DIAGNOSIS — Z79890 Hormone replacement therapy (postmenopausal): Secondary | ICD-10-CM | POA: Diagnosis not present

## 2024-09-24 DIAGNOSIS — J90 Pleural effusion, not elsewhere classified: Secondary | ICD-10-CM | POA: Diagnosis not present

## 2024-09-24 LAB — COMPREHENSIVE METABOLIC PANEL WITH GFR
ALT: 10 U/L (ref 0–44)
AST: 59 U/L — ABNORMAL HIGH (ref 15–41)
Albumin: 3 g/dL — ABNORMAL LOW (ref 3.5–5.0)
Alkaline Phosphatase: 257 U/L — ABNORMAL HIGH (ref 38–126)
Anion gap: 21 — ABNORMAL HIGH (ref 5–15)
BUN: 48 mg/dL — ABNORMAL HIGH (ref 8–23)
CO2: 15 mmol/L — ABNORMAL LOW (ref 22–32)
Calcium: 9.8 mg/dL (ref 8.9–10.3)
Chloride: 95 mmol/L — ABNORMAL LOW (ref 98–111)
Creatinine, Ser: 1.17 mg/dL — ABNORMAL HIGH (ref 0.44–1.00)
GFR, Estimated: 52 mL/min — ABNORMAL LOW
Glucose, Bld: 81 mg/dL (ref 70–99)
Potassium: 4.8 mmol/L (ref 3.5–5.1)
Sodium: 131 mmol/L — ABNORMAL LOW (ref 135–145)
Total Bilirubin: 0.3 mg/dL (ref 0.0–1.2)
Total Protein: 8 g/dL (ref 6.5–8.1)

## 2024-09-24 LAB — CBC WITH DIFFERENTIAL/PLATELET
Abs Immature Granulocytes: 0.07 K/uL (ref 0.00–0.07)
Basophils Absolute: 0 K/uL (ref 0.0–0.1)
Basophils Relative: 0 %
Eosinophils Absolute: 0 K/uL (ref 0.0–0.5)
Eosinophils Relative: 1 %
HCT: 22.3 % — ABNORMAL LOW (ref 36.0–46.0)
Hemoglobin: 6.8 g/dL — CL (ref 12.0–15.0)
Immature Granulocytes: 1 %
Lymphocytes Relative: 25 %
Lymphs Abs: 1.5 K/uL (ref 0.7–4.0)
MCH: 29.6 pg (ref 26.0–34.0)
MCHC: 30.5 g/dL (ref 30.0–36.0)
MCV: 97 fL (ref 80.0–100.0)
Monocytes Absolute: 0.8 K/uL (ref 0.1–1.0)
Monocytes Relative: 13 %
Neutro Abs: 3.5 K/uL (ref 1.7–7.7)
Neutrophils Relative %: 60 %
Platelets: 368 K/uL (ref 150–400)
RBC: 2.3 MIL/uL — ABNORMAL LOW (ref 3.87–5.11)
RDW: 18.4 % — ABNORMAL HIGH (ref 11.5–15.5)
WBC: 5.9 K/uL (ref 4.0–10.5)
nRBC: 0 % (ref 0.0–0.2)

## 2024-09-24 LAB — PREPARE RBC (CROSSMATCH)

## 2024-09-24 LAB — SAMPLE TO BLOOD BANK

## 2024-09-24 MED ORDER — ACETAMINOPHEN 325 MG PO TABS
650.0000 mg | ORAL_TABLET | Freq: Once | ORAL | Status: AC
  Administered 2024-09-24: 650 mg via ORAL
  Filled 2024-09-24: qty 2

## 2024-09-24 MED ORDER — SODIUM CHLORIDE 0.9% IV SOLUTION
250.0000 mL | INTRAVENOUS | Status: DC
  Administered 2024-09-24: 250 mL via INTRAVENOUS

## 2024-09-24 MED ORDER — DIPHENHYDRAMINE HCL 25 MG PO CAPS
25.0000 mg | ORAL_CAPSULE | Freq: Once | ORAL | Status: AC
  Administered 2024-09-24: 25 mg via ORAL
  Filled 2024-09-24: qty 1

## 2024-09-24 NOTE — Patient Instructions (Signed)
 [facility] - A DEPT OF [facility]  Discharge Instructions: Thank you for choosing [facility] to provide your oncology and hematology care.  If you have a lab appointment with the Cancer Center - please note that after April 8th, 2024, all labs will be drawn in the cancer center.  You do not have to check in or register with the main entrance as you have in the past but will complete your check-in in the cancer center.  Wear comfortable clothing and clothing appropriate for easy access to any Portacath or PICC line.   We strive to give you quality time with your provider. You may need to reschedule your appointment if you arrive late (15 or more minutes).  Arriving late affects you and other patients whose appointments are after yours.  Also, if you miss three or more appointments without notifying the office, you may be dismissed from the clinic at the provider's discretion.      For prescription refill requests, have your pharmacy contact our office and allow 72 hours for refills to be completed.    Today you received the following chemotherapy and/or immunotherapy agents blood products. Blood Transfusion, Adult, Care After The following information offers guidance on how to care for yourself after your procedure. Your health care provider may also give you more specific instructions. If you have problems or questions, contact your health care provider. What can I expect after the procedure? After the procedure, it is common to have: Bruising and soreness where the IV was inserted. A headache. Follow these instructions at home: IV insertion site care     Follow instructions from your health care provider about how to take care of your IV insertion site. Make sure you: Wash your hands with soap and water for at least 20 seconds before and after you change your bandage (dressing). If soap and water are not available, use hand sanitizer. Change  your dressing as told by your health care provider. Check your IV insertion site every day for signs of infection. Check for: Redness, swelling, or pain. Bleeding from the site. Warmth. Pus or a bad smell. General instructions Take over-the-counter and prescription medicines only as told by your health care provider. Rest as told by your health care provider. Return to your normal activities as told by your health care provider. Keep all follow-up visits. Lab tests may need to be done at certain periods to recheck your blood counts. Contact a health care provider if: You have itching or red, swollen areas of skin (hives). You have a fever or chills. You have pain in the head, back, or chest. You feel anxious or you feel weak after doing your normal activities. You have redness, swelling, warmth, or pain around the IV insertion site. You have blood coming from the IV insertion site that does not stop with pressure. You have pus or a bad smell coming from your IV insertion site. If you received your blood transfusion in an outpatient setting, you will be told whom to contact to report any reactions. Get help right away if: You have symptoms of a serious allergic or immune system reaction, including: Trouble breathing or shortness of breath. Swelling of the face, feeling flushed, or widespread rash. Dark urine or blood in the urine. Fast heartbeat. These symptoms may be an emergency. Get help right away. Call 911. Do not wait to see if the symptoms will go away. Do not drive yourself  to the hospital. Summary Bruising and soreness around the IV insertion site are common. Check your IV insertion site every day for signs of infection. Rest as told by your health care provider. Return to your normal activities as told by your health care provider. Get help right away for symptoms of a serious allergic or immune system reaction to the blood transfusion. This information is not intended to  replace advice given to you by your health care provider. Make sure you discuss any questions you have with your health care provider. Document Revised: 12/21/2021 Document Reviewed: 12/21/2021 Elsevier Patient Education  2024 [employer] Inc[employer]      To help prevent nausea and vomiting after your treatment, we encourage you to take your nausea medication as directed.  BELOW ARE SYMPTOMS THAT SHOULD BE REPORTED IMMEDIATELY: *FEVER GREATER THAN 100.4 F (38 C) OR HIGHER *CHILLS OR SWEATING *NAUSEA AND VOMITING THAT IS NOT CONTROLLED WITH YOUR NAUSEA MEDICATION *UNUSUAL SHORTNESS OF BREATH *UNUSUAL BRUISING OR BLEEDING *URINARY PROBLEMS (pain or burning when urinating, or frequent urination) *BOWEL PROBLEMS (unusual diarrhea, constipation, pain near the anus) TENDERNESS IN MOUTH AND THROAT WITH OR WITHOUT PRESENCE OF ULCERS (sore throat, sores in mouth, or a toothache) UNUSUAL RASH, SWELLING OR PAIN  UNUSUAL VAGINAL DISCHARGE OR ITCHING   Items with * indicate a potential emergency and should be followed up as soon as possible or go to the Emergency Department if any problems should occur.  Please show the CHEMOTHERAPY ALERT CARD or IMMUNOTHERAPY ALERT CARD at check-in to the Emergency Department and triage nurse.  Should you have questions after your visit or need to cancel or reschedule your appointment, please contact [facility] [place] - A DEPT OF Eligha Bridegroom [facility] [phone]  and follow the prompts.  Office hours are 8:00 a.m. to 4:30 p.m. Monday - Friday. Please note that voicemails left after 4:00 p.m. may not be returned until the following business day.  We are closed weekends and major holidays. You have access to a nurse at all times for urgent questions. Please call the main number to the clinic [phone] and follow the prompts.  For any non-urgent questions, you may also contact your provider using MyChart. We now offer e-Visits for anyone 62 and older to  request care online for non-urgent symptoms. For details visit [email or web address].   Also download the MyChart app! Go to the app store, search "MyChart", open the app, select [place], and log in with your MyChart username and password.

## 2024-09-24 NOTE — Progress Notes (Signed)
2 units of blood given today per MD orders. Tolerated infusion without adverse affects. Vital signs stable. No complaints at this time. Discharged from clinic ambulatory in stable condition. Alert and oriented x 3. F/U with [facility] as scheduled.

## 2024-09-24 NOTE — Progress Notes (Signed)
 Patients port flushed without difficulty.  Good blood return noted with no bruising or swelling noted at site.  All labs drawn per orders.  VSS with discharge and left in satisfactory condition with no s/s of distress noted. All follow ups as scheduled.       Deanna Bush

## 2024-09-24 NOTE — Progress Notes (Signed)
 CRITICAL VALUE STICKER  CRITICAL VALUE: Hemoglobin 6.8   DATE & TIME NOTIFIED: 09/24/2024 9:03 a.m.  MESSENGER (representative from lab):Olam  MD NOTIFIED: Davonna  TIME OF NOTIFICATION:9:07 a.m.  RESPONSE:  Patient is here to see Physician and will be taken care of during visit.

## 2024-09-24 NOTE — Progress Notes (Signed)
 " Patient Care Team: [name], MD as PCP - General Mallipeddi, [name], MD as PCP - Cardiology (Cardiology)  Clinic Day:  09/24/2024  Referring physician: [name], MD   CHIEF COMPLAINT:  CC: Metastatic clear cell RCC   ASSESSMENT & PLAN:   Assessment & Plan: Deanna Bush  is a 63 y.o. female with metastatic clear cell RCC  Assessment and Plan Assessment & Plan Metastatic right renal cell carcinoma Metastatic renal cell carcinoma s/p multiple lines of treatment Oncology history as below Patient currently on Tivozanib and reports no complaints from it and changed to pazopanib  at progression.   -Patient tolerating pazopanib  well with no complications.  Reports some bleeding from the oral cavity lesion - Patient did not want to go for clinical trial evaluation owing to travel time. - We agree discussed the side effects of pazopanib  in detail.It commonly causes hypertension, diarrhea, nausea, fatigue, hair-color changes, and elevated liver enzymes, with less common but serious risks such as hepatotoxicity and QT prolongation. -In the pivotal [id number] trial for treatment-nave metastatic RCC, pazopanib  improved progression-free survival but showed a median overall survival (OS) of ~22.9 months vs. 20.5 months with placebo but the difference was not statistically significant. 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   - Will administer packed red blood cell transfusion in clinic today.   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 Not consistent with squamous cell carcinoma or clear-cell renal cell carcinoma.  Markers negative for primary salivary gland tumor.  There is a  possibility of interosseous carcinoma.  -Will request for cancer type ID. - If patient has a new primary carcinoma, will discuss goals of care at this point in time and a probable palliative radiation.  Vitamin B12 deficiency Patient has mild vitamin B12 deficiency with levels less than 400  - Continue oral vitamin B12 1000 mcg daily  Right sided pleural effusion Patient has significant right-sided pleural effusion s/p thoracentesis She is currently asymptomatic  - Continue to monitor for now  Hypothyroidism On levothyroxine  125mcg daily  -Will recheck with next labs  Goals of care discussion Patient is DNR.  Prefers to continue treatments at this time.  The patient understands the plans discussed today and is in agreement with them.  She knows to contact our office if she develops concerns prior to her next appointment.  The total time spent in the appointment was 30 minutes for the encounter  with patient, including review of chart and various tests results, discussions about plan of care and coordination of care plan  Mickiel Dry, MD  [facility] - A DEPT OF [name] [address] [place] [address] Dept: [phone] Dept Fax: [phone]   No orders of the defined types were placed in this encounter.    ONCOLOGY HISTORY:   I have reviewed her chart and materials related to her cancer extensively and collaborated history with the patient. Summary of oncologic history is as follows:   Metastatic clear cell RCC:  -12/23/2020: Presented to the ER with hematuria  -12/24/2020: CT renal stone study: Large right renal upper pole mass most consistent with Malignancy -01/02/2021: MRI abdomen: 12.9 cm in  long axis right kidney upper pole tumor abuts the liver and diaphragm, and extends into the right renal pelvis where there is probable involvement of the right renal vein (but not the  IVC). -02/06/2021: Kidney, adrenal, thrombus, right, radical nephrectomy: - Clear-cell renal cell carcinoma, nuclear grade 4, size 12.5 - Necrosis and rhabdoid features not identified -Tumor extends into and invades the wall of vena cava(pT3c)  -Vascular, ureteral and all margins of resection are negative for tumor  -05/28/2021: CT abdomen:Several nodules along the peritoneal surface of the RIGHT nephrectomy bed warrant close attention on routine surveillance. -09/18/2021: CT abdomen: Progression nodularity within the RIGHT nephrectomy bed. Now with obvious enlarged enhancing nodules adjacent to the RIGHT hepatic lobe extending inferiorly along the RIGHT retroperitoneum with multiple enlarged enhancing nodules.Direct metastatic invasion into the RIGHT hepatic lobe.  -10/03/2021: CT chest: There are several pulmonary nodules scattered throughout the lungs bilaterally, some of which could be indicative of metastatic disease to the lungs, however, many of these nodules appear to reflect areas of mucoid impaction, potentially from atypical infection. Close attention on follow-up studies is recommended to ensure the stability or regression of these lesions. - 11/14/2021: [facility] NGS: MSI-stable, pMMR, PD-L1: Negative 1+, 2%.TMB low, 3mut/Mb  - BRAF, NTRK 1/2/3, RET, BRCA1/2, FH, MET, MYCN, PALB2, SDHA, SDHB, SDHC, SDHD, SETD2: Negative - 10/22/2021: Kidney, right nephrectomy: Clear-cell renal cell carcinoma with necrosis  -11/19/2021-01/24/2022: Ipilimumab  and Nivolumab  x 4 cycles -02/14/2022: CT CAP:  Numerous new and enlarged pulmonary nodules throughout the lungs, the majority of which are clustered and concentrated in bronchiolar distributions. Findings are consistent with worsened pulmonary metastatic disease. Significant interval increase in now relatively bulky hypodense nodularity throughout the right retroperitoneum, hepatorenal recess, and right paracolic gutter, consistent with worsened soft tissue  metastatic disease. New hypodense liver lesions, consistent with new hepatic metastatic disease. -03/13/2022-07/22/2023: Lenvatinib  12mg  and Everolimus  5mg  daily -05/20/2022, 09/09/2022, 01/01/2023: CT CAP: Stable to treatment response with decreased size of lesions in abdomen. -04/29/2023: CT CAP: Very extensive rim enhancing soft tissue nodularity throughout the right retroperitoneum, hepatorenal fossa, right paracolic gutter, and porta hepatis, as well as within the overlying right flank musculature. Some very slightly enlarged nodules inferiorly, as well as two new small nodules in the right erector spinae musculature, consistent with slightly worsened local disease. Multiple hypodense liver parenchymal lesions are not significantly changed in size, however some of these are increasingly hypodense, suggesting treatment response of hepatic metastatic disease. No new liver lesions. -07/15/2023: CT CAP: Progressive extensive soft tissue nodularity in the right retroperitoneum, hepatorenal fossa, right pericolic gutter and porta hepatis extending into the musculature/soft tissues overlying the right flank. Increased burden of tumor nodularity or thrombus involving the IVC at the level of the right renal vein with increased burden of IVC thrombus versus mixing artifact of the IVC and extending down the right iliac veins. New pathologically enlarged prevascular lymph node measures 10 mm. Bilobar hypodense hepatic lesions again seen some of which have increased in size and others are stable. No new suspicious hepatic lesion identified. -07/29/2023-10/10/2023: Belzutifan  120mg  daily -09/29/2023: CT CAP: Massive increase in metastatic disease.  -10/20/2023-05/11/2024: Cabozantinib 40mg  daily -02/02/2024: CT CAP: Stable to mild improvement in all areas -04/22/2024: CT CAP: Findings are consistent with progression of disease in almost all areas. -05/14/2024-Current: Tivozanib 1.34mg  daily - 08/12/2024: CT CAP:  Interval enlargement of a right-sided pleural effusion, now large with associated atelectasis or consolidation of the right lung. Interval enlargement of pleural and mediastinal nodal metastases. Interval enlargement of  rim enhancing liver metastases. Bulky, infiltrative tumor throughout the right nephrectomy bed which is confluent with and indistinguishable from bulky upper abdominal lymphadenopathy, throughout the retroperitoneum and directly involving the adjacent organs and abdominal wall, all of which is slightly increased compared to prior examination. Metastatic implants in the right hemipelvis slightly increased in size. Interval enlargement of osseous metastases involving the posterior right eighth and twelfth ribs. - 08/27/2024-current: Pazopanib  800 mg daily.  Oral cavity lesion  -08/06/2024: Biopsy: High-grade carcinoma of unknown primary with identified multinucleated cells of histiocytic origin. - 09/13/2024: Second opinion path exam at [facility]: High-grade carcinoma  -Negative p40 and p63 lend no support for squamous cell carcinoma.   -Negative PAX8, CAIX, and CD10 exclude involvement by clear cell renal cell carcinoma.   -Negative SOX10, CK7, and S100 make a primary salivary gland tumor very unlikely.   -Though there is definite CK20 positivity, more specific markers of lower gastrointestinal origin (CDX2 and SATB2) are negative.   Current Treatment: Pazopanib   INTERVAL HISTORY:   Discussed the use of AI scribe software for clinical note transcription with the patient, who gave verbal consent to proceed.  History of Present Illness Deanna Bush is a 63 year old female with renal cell carcinoma who presents for oncology follow-up .  She is accompanied by her friend today.  She reports persistent oral bleeding from her oral lesion, primarily triggered by mastication when food contacts the affected side of her mouth. The bleeding is substantial, often requiring application of a tea bag  for hemostasis. The oral lesion has rapidly increased in size since her last visit, now involving the intraoral mucosa in addition to the previously affected external aspect.  She remains able to eat and maintains a good appetite, with no current weight loss reported. Her weight was not measured at this visit.  She continues her current pazopanib  and vitamin B12 supplementation. There is no evidence of rectal bleeding.  She previously underwent a single thoracentesis for malignant pleural effusion with improvement in dyspnea and does not desire repeat intervention at this time. She has discussed her condition and prognosis with her sons.    I have reviewed the past medical history, past surgical history, social history and family history with the patient and they are unchanged from previous note.  ALLERGIES:  is allergic to amlodipine , sulfonamide derivatives, and bee venom.  MEDICATIONS:  Current Outpatient Medications  Medication Sig Dispense Refill   acetaminophen  (TYLENOL ) 500 MG tablet Take 500 mg by mouth every 6 (six) hours as needed for moderate pain or headache.     amoxicillin -clavulanate (AUGMENTIN ) 875-125 MG tablet Take 1 tablet by mouth 2 (two) times daily. 7 tablet 0   atorvastatin  (LIPITOR) 40 MG tablet Take 1 tablet (40 mg total) by mouth daily. 30 tablet 1   busPIRone  (BUSPAR ) 5 MG tablet Take one tablet by mouth twice  daily as needed, for anxiety 60 tablet 3   EPINEPHrine  0.3 mg/0.3 mL IJ SOAJ injection Inject 0.3 mg into the muscle as needed for anaphylaxis. 2 each 0   furosemide  (LASIX ) 20 MG tablet TAKE 1 TABLET(20 MG) BY MOUTH DAILY AS NEEDED 90 tablet 1   HYDROcodone -acetaminophen  (NORCO) 5-325 MG tablet Take 1 tablet by mouth every 6 (six) hours as needed for moderate pain (pain score 4-6). 30 tablet 0   levothyroxine  (SYNTHROID ) 125 MCG tablet TAKE 1 TABLET(125 MCG) BY MOUTH DAILY BEFORE BREAKFAST 90 tablet 1   magnesium  oxide (MAG-OX) 400 (240 Mg) MG tablet Take 1  tablet (  400 mg total) by mouth daily. 30 tablet 4   Multiple Vitamin (MULTIVITAMIN WITH MINERALS) TABS tablet Take 1 tablet by mouth daily.     olmesartan -hydrochlorothiazide (BENICAR  HCT) 20-12.5 MG tablet Take 1 tablet by mouth daily. 30 tablet 5   pazopanib  (VOTRIENT ) 200 MG tablet Take 4 tablets (800 mg total) by mouth daily. Take on an empty stomach, one hour before or two hours after food. 120 tablet 2   prochlorperazine  (COMPAZINE ) 10 MG tablet Take 1 tablet (10 mg total) by mouth every 6 (six) hours as needed. 30 tablet 2   UNABLE TO FIND 6 mastectomy prosthesis and bras. 6 each 0   UNABLE TO FIND Take 237 mLs by mouth in the morning and at bedtime. Med Name: Ensure Plus. DX : E46 and C64 72 each 3   urea  (CARMOL) 10 % cream Apply topically 2 (two) times daily. Apply twice daily to hands 71 g 0   No current facility-administered medications for this visit.   Facility-Administered Medications Ordered in Other Visits  Medication Dose Route Frequency Provider Last Rate Last Admin   0.9 %  sodium chloride  infusion (Manually program via Guardrails IV Fluids)  250 mL Intravenous Continuous Thai Hemrick, MD   Stopped at 06/11/24 1240   heparin  lock flush 100 unit/mL  500 Units Intravenous Once Katragadda, Sreedhar, MD        VITALS:  There were no vitals taken for this visit.  Wt Readings from Last 3 Encounters:  08/20/24 145 lb (65.8 kg)  07/12/24 143 lb 1.3 oz (64.9 kg)  05/11/24 146 lb 13.2 oz (66.6 kg)    There is no height or weight on file to calculate BMI.  Performance status (ECOG): 1 - Symptomatic but completely ambulatory  PHYSICAL EXAM:   GENERAL:alert, no distress and comfortable HEENT: Oral malignant lesion extending from the mandibular area into the mouth to under the tongue, hard, nontender. LYMPH:  no palpable lymphadenopathy in the cervical, axillary or inguinal LUNGS: Decreased breath sounds in the right lower lobe, rest clear lung sounds.   HEART: regular  rate & rhythm and no murmurs and no lower extremity edema ABDOMEN:abdomen soft, non-tender and normal bowel sounds Musculoskeletal:no cyanosis of digits and no clubbing  NEURO: alert & oriented x 3 with fluent speech    LABORATORY DATA:  I have reviewed the data as listed  Lab Results  Component Value Date   WBC 6.3 08/12/2024   NEUTROABS 4.4 08/12/2024   HGB 9.2 (L) 08/12/2024   HCT 29.2 (L) 08/12/2024   MCV 94.5 08/12/2024   PLT 393 08/12/2024      Chemistry      Component Value Date/Time   NA 132 (L) 08/12/2024 0854   NA 133 (L) 03/23/2024 1018   K 4.1 08/12/2024 0854   CL 96 (L) 08/12/2024 0854   CO2 23 08/12/2024 0854   BUN 18 08/12/2024 0854   BUN 28 (H) 03/23/2024 1018   CREATININE 0.79 08/12/2024 0854   CREATININE 0.73 04/22/2019 0848      Component Value Date/Time   CALCIUM  9.4 08/12/2024 0854   ALKPHOS 230 (H) 08/12/2024 0854   AST 79 (H) 08/12/2024 0854   ALT 13 08/12/2024 0854   BILITOT 0.6 08/12/2024 0854   BILITOT 0.3 03/23/2024 1018      Latest Reference Range & Units 06/10/24 10:13 06/11/24 09:04  Iron  28 - 170 ug/dL  27 (L)  UIBC ug/dL  810  TIBC 749 - 549 ug/dL  783 (L)  Saturation Ratios 10.4 - 31.8 %  13  Ferritin 11 - 307 ng/mL  1,017 (H)  Folate >5.9 ng/mL 7.4   (L): Data is abnormally low (H): Data is abnormally high   Latest Reference Range & Units 06/10/24 10:12  Vitamin B12 180 - 914 pg/mL 213    Latest Reference Range & Units 06/10/24 10:13  TSH 0.350 - 4.500 uIU/mL 15.637 (H)  (H): Data is abnormally high   RADIOGRAPHIC STUDIES: I have personally reviewed the radiological images as listed and agreed with the findings in the report. "

## 2024-09-25 LAB — BPAM RBC
Blood Product Expiration Date: 202601152359
Blood Product Expiration Date: 202601152359
ISSUE DATE / TIME: 202512191029
ISSUE DATE / TIME: 202512191155
Unit Type and Rh: 5100
Unit Type and Rh: 5100

## 2024-09-25 LAB — TYPE AND SCREEN
ABO/RH(D): O POS
Antibody Screen: NEGATIVE
Unit division: 0
Unit division: 0

## 2024-09-27 ENCOUNTER — Inpatient Hospital Stay: Payer: Medicare (Managed Care)

## 2024-09-27 ENCOUNTER — Other Ambulatory Visit (HOSPITAL_COMMUNITY): Payer: Self-pay

## 2024-09-27 DIAGNOSIS — C641 Malignant neoplasm of right kidney, except renal pelvis: Secondary | ICD-10-CM | POA: Diagnosis not present

## 2024-09-27 DIAGNOSIS — E039 Hypothyroidism, unspecified: Secondary | ICD-10-CM

## 2024-09-27 LAB — CBC
HCT: 28 % — ABNORMAL LOW (ref 36.0–46.0)
Hemoglobin: 8.7 g/dL — ABNORMAL LOW (ref 12.0–15.0)
MCH: 29.7 pg (ref 26.0–34.0)
MCHC: 31.1 g/dL (ref 30.0–36.0)
MCV: 95.6 fL (ref 80.0–100.0)
Platelets: 314 K/uL (ref 150–400)
RBC: 2.93 MIL/uL — ABNORMAL LOW (ref 3.87–5.11)
RDW: 17.4 % — ABNORMAL HIGH (ref 11.5–15.5)
WBC: 6.3 K/uL (ref 4.0–10.5)
nRBC: 0 % (ref 0.0–0.2)

## 2024-09-27 LAB — SAMPLE TO BLOOD BANK

## 2024-09-27 NOTE — Progress Notes (Signed)
Patients port flushed without difficulty.  Good blood return noted with no bruising or swelling noted at site.  Patient remains accessed for possible treatment.  

## 2024-09-29 ENCOUNTER — Ambulatory Visit (HOSPITAL_COMMUNITY): Payer: Medicare (Managed Care) | Admitting: Psychiatry

## 2024-10-04 ENCOUNTER — Encounter: Payer: Self-pay | Admitting: *Deleted

## 2024-10-13 ENCOUNTER — Ambulatory Visit: Payer: Self-pay | Admitting: Family Medicine

## 2024-10-14 ENCOUNTER — Ambulatory Visit (HOSPITAL_COMMUNITY): Payer: Medicare (Managed Care) | Admitting: Psychiatry

## 2024-10-20 ENCOUNTER — Other Ambulatory Visit: Payer: Self-pay

## 2024-10-22 ENCOUNTER — Other Ambulatory Visit (HOSPITAL_COMMUNITY): Payer: Self-pay

## 2024-10-22 NOTE — Progress Notes (Signed)
 Specialty Pharmacy Refill Coordination Note  Deanna Bush is a 64 y.o. female contacted today regarding refills of specialty medication(s) PAZOPanib  HCl (VOTRIENT )   Patient requested Delivery   Delivery date: 10/27/24   Verified address: [address]  [place] [address]   Medication will be filled on: 10/26/24

## 2024-10-26 ENCOUNTER — Other Ambulatory Visit (HOSPITAL_COMMUNITY): Payer: Self-pay

## 2024-10-26 ENCOUNTER — Other Ambulatory Visit: Payer: Self-pay

## 2024-10-27 NOTE — Progress Notes (Signed)
 " Patient Care Team: [name], MD as PCP - General Mallipeddi, [name], MD as PCP - Cardiology (Cardiology)  Clinic Day:  10/28/2024  Referring physician: [name], MD   CHIEF COMPLAINT:  CC: Metastatic clear cell RCC   ASSESSMENT & PLAN:   Assessment & Plan: AHSLEY Bush  is a 64 y.o. female with metastatic clear cell RCC  Assessment and Plan  Metastatic right renal cell carcinoma Metastatic renal cell carcinoma s/p multiple lines of treatment Oncology history as below Patient currently on pazopanib .   -Patient tolerating pazopanib  well with no complications.   - Patient did not want to go for clinical trial evaluation owing to travel time. - Re-emphasized side effects of pazopanib - hypertension, diarrhea, nausea, fatigue, hair-color changes, and elevated liver enzymes, with less common but serious risks such as hepatotoxicity and QT prolongation. 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 Not consistent with squamous cell carcinoma or clear-cell renal cell carcinoma.  Markers negative for primary salivary gland tumor.   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  - Will administer packed red blood cell transfusion in clinic today.  Pedal edema Likely secondary to hypothyroidism and side effects of pazopanib   - Increase Lasix  to 400 mg daily. -  Advised caution with blood transfusion today.   Vitamin B12 deficiency Patient has mild vitamin B12 deficiency with levels less than 400  - Continue oral vitamin B12 1000 mcg daily  Right sided pleural effusion Patient has significant right-sided pleural effusion s/p thoracentesis Breath sounds significantly decreased on right side today  -Will obtain chest x-ray today and order thoracentesis if patient has increased pleural effusion.  Hypothyroidism On levothyroxine  125mcg daily TSH increased from prior.  Dr. Lenis manages it  -Will reach out to Dr. Lenis if he would like us  to manage this.  Goals of care discussion Patient is DNR.  Prefers to continue treatments at this time.  The patient understands the plans discussed today and is in agreement with them.  She knows to contact our office if she develops concerns prior to her next appointment.  The total time spent in the appointment was 30 minutes for the encounter  with patient, including review of chart and various tests results, discussions about plan of care and coordination of care plan   I,Helena R Teague,acting as a scribe for Mickiel Dry, MD.,have documented all relevant documentation on the behalf of Mickiel Dry, MD,as directed by  Mickiel Dry, MD while in the presence of Mickiel Dry, MD.  I, Mickiel Dry MD, have reviewed the above documentation for accuracy and completeness, and I agree with the above.    Mickiel Dry, MD  [facility] [place] - A DEPT OF [name] [address] [place] [address] Dept: [phone] Dept Fax: [phone]   Orders Placed This Encounter  Procedures   CT CHEST ABDOMEN PELVIS  W CONTRAST    Standing Status:   Future    Expected Date:   11/28/2024    Expiration Date:   10/28/2025    If indicated for the ordered procedure, I authorize the administration of contrast media per Radiology protocol:   Yes     Does the patient have a contrast media/X-ray dye allergy?:   No    Preferred imaging location?:   [facility]    If indicated for the ordered procedure, I authorize the administration of oral contrast media per Radiology protocol:   Yes   DG Chest 2 View    Standing Status:   Future    Expiration Date:   10/28/2025    Reason for Exam (SYMPTOM  OR DIAGNOSIS REQUIRED):   decreased breath sounds right lung; shortness of breath    Preferred imaging location?:   [facility]     ONCOLOGY HISTORY:   I have reviewed her chart and materials related to her cancer extensively and collaborated history with the patient. Summary of oncologic history is as follows:   Metastatic clear cell RCC:  -12/23/2020: Presented to the ER with hematuria  -12/24/2020: CT renal stone study: Large right renal upper pole mass most consistent with Malignancy -01/02/2021: MRI abdomen: 12.9 cm in long axis right kidney upper pole tumor abuts the liver and diaphragm, and extends into the right renal pelvis where there is probable involvement of the right renal vein (but not the IVC). -02/06/2021: Kidney, adrenal, thrombus, right, radical nephrectomy: - Clear-cell renal cell carcinoma, nuclear grade 4, size 12.5 - Necrosis and rhabdoid features not identified -Tumor extends into and invades the wall of vena cava(pT3c)  -Vascular, ureteral and all margins of resection are negative for tumor  -05/28/2021: CT abdomen:Several nodules along the peritoneal surface of the RIGHT nephrectomy bed warrant close attention on routine surveillance. -09/18/2021: CT abdomen: Progression nodularity within the RIGHT nephrectomy bed. Now with obvious enlarged enhancing nodules adjacent to the RIGHT hepatic lobe extending inferiorly along the RIGHT retroperitoneum with multiple enlarged enhancing nodules.Direct metastatic invasion into the RIGHT hepatic lobe.  -10/03/2021: CT chest: There are several pulmonary nodules  scattered throughout the lungs bilaterally, some of which could be indicative of metastatic disease to the lungs, however, many of these nodules appear to reflect areas of mucoid impaction, potentially from atypical infection. Close attention on follow-up studies is recommended to ensure the stability or regression of these lesions. - 11/14/2021: [facility] NGS: MSI-stable, pMMR, PD-L1: Negative 1+, 2%.TMB low, 3mut/Mb  - BRAF, NTRK 1/2/3, RET, BRCA1/2, FH, MET, MYCN, PALB2, SDHA, SDHB, SDHC, SDHD, SETD2: Negative - 10/22/2021: Kidney, right nephrectomy: Clear-cell renal cell carcinoma with necrosis  -11/19/2021-01/24/2022: Ipilimumab  and Nivolumab  x 4 cycles -02/14/2022: CT CAP:  Numerous new and enlarged pulmonary nodules throughout the lungs, the majority of which are clustered and concentrated in bronchiolar distributions. Findings are consistent with worsened pulmonary metastatic disease. Significant interval increase in now relatively bulky hypodense nodularity throughout the right retroperitoneum, hepatorenal recess, and right paracolic gutter, consistent with worsened soft tissue metastatic disease. New hypodense liver lesions, consistent with new hepatic metastatic disease. -03/13/2022-07/22/2023: Lenvatinib  12mg  and Everolimus  5mg  daily -05/20/2022, 09/09/2022, 01/01/2023: CT CAP: Stable to treatment response with decreased size of lesions in abdomen. -04/29/2023: CT CAP: Very extensive rim enhancing soft tissue nodularity throughout the right retroperitoneum, hepatorenal fossa, right paracolic gutter, and porta hepatis, as well as within the overlying right flank musculature. Some very slightly enlarged nodules inferiorly, as well as two new  small nodules in the right erector spinae musculature, consistent with slightly worsened local disease. Multiple hypodense liver parenchymal lesions are not significantly changed in size, however some of these are increasingly hypodense, suggesting treatment  response of hepatic metastatic disease. No new liver lesions. -07/15/2023: CT CAP: Progressive extensive soft tissue nodularity in the right retroperitoneum, hepatorenal fossa, right pericolic gutter and porta hepatis extending into the musculature/soft tissues overlying the right flank. Increased burden of tumor nodularity or thrombus involving the IVC at the level of the right renal vein with increased burden of IVC thrombus versus mixing artifact of the IVC and extending down the right iliac veins. New pathologically enlarged prevascular lymph node measures 10 mm. Bilobar hypodense hepatic lesions again seen some of which have increased in size and others are stable. No new suspicious hepatic lesion identified. -07/29/2023-10/10/2023: Belzutifan  120mg  daily -09/29/2023: CT CAP: Massive increase in metastatic disease.  -10/20/2023-05/11/2024: Cabozantinib 40mg  daily -02/02/2024: CT CAP: Stable to mild improvement in all areas -04/22/2024: CT CAP: Findings are consistent with progression of disease in almost all areas. -05/14/2024-Current: Tivozanib 1.34mg  daily - 08/12/2024: CT CAP: Interval enlargement of a right-sided pleural effusion, now large with associated atelectasis or consolidation of the right lung. Interval enlargement of pleural and mediastinal nodal metastases. Interval enlargement of rim enhancing liver metastases. Bulky, infiltrative tumor throughout the right nephrectomy bed which is confluent with and indistinguishable from bulky upper abdominal lymphadenopathy, throughout the retroperitoneum and directly involving the adjacent organs and abdominal wall, all of which is slightly increased compared to prior examination. Metastatic implants in the right hemipelvis slightly increased in size. Interval enlargement of osseous metastases involving the posterior right eighth and twelfth ribs. - 08/27/2024-current: Pazopanib  800 mg daily.  Oral cavity lesion  -08/06/2024: Biopsy: High-grade  carcinoma of unknown primary with identified multinucleated cells of histiocytic origin. - 09/13/2024: Second opinion path exam at [facility]: High-grade carcinoma  -Negative p40 and p63 lend no support for squamous cell carcinoma.   -Negative PAX8, CAIX, and CD10 exclude involvement by clear cell renal cell carcinoma.   -Negative SOX10, CK7, and S100 make a primary salivary gland tumor very unlikely.   -Though there is definite CK20 positivity, more specific markers of lower gastrointestinal origin (CDX2 and SATB2) are negative.   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 She is accompanied by her sister today.   Alisia has bilateral lower extremity edema,though she is taking lasix  20 mg daily. She is agreeable to increase her dosage to 40 mg daily.   She had follow-up with Dr. Dannielle on 10/05/2024 and has received radiation to her sarcoma oral lesion. Her oral pain has improved and she has no bleeding to the area. Chloe is also able to eat food now, though it must be cut into small pieces. She is also drinking one ensure a day. Despite this, her protein is low and I agreed with the patient's sister that she should begin drinking 2 Ensures a day. She is able to ambulate   Alfredia occasionally has difficulty breathing and had right lung decreased breath sounds on physical exam. I ordered a chest x-ray to evaluate for worsened pleural effusion.   Lelania has anemia, and she denies any active bleeding or new onset pains. She does note fatigue that she believes will improve after receiving a blood transfusion.   Kerrin'sTSH levels are  elevated, though she is taking synthroid  125 mcg, which is managed by Dr. Lenis. I will reach out to Cr. Nida's office to manage this.   I have reviewed the past medical history, past surgical history, social  history and family history with the patient and they are unchanged from previous note.  ALLERGIES:  is allergic to amlodipine , sulfonamide derivatives, and bee venom.  MEDICATIONS:  Current Outpatient Medications  Medication Sig Dispense Refill   acetaminophen  (TYLENOL ) 500 MG tablet Take 500 mg by mouth every 6 (six) hours as needed for moderate pain or headache.     amoxicillin -clavulanate (AUGMENTIN ) 875-125 MG tablet Take 1 tablet by mouth 2 (two) times daily. 7 tablet 0   atorvastatin  (LIPITOR) 40 MG tablet Take 1 tablet (40 mg total) by mouth daily. 30 tablet 1   busPIRone  (BUSPAR ) 5 MG tablet Take one tablet by mouth twice  daily as needed, for anxiety 60 tablet 3   EPINEPHrine  0.3 mg/0.3 mL IJ SOAJ injection Inject 0.3 mg into the muscle as needed for anaphylaxis. 2 each 0   folic acid  (FOLVITE ) 1 MG tablet Take 1 tablet (1 mg total) by mouth daily. 90 tablet 3   HYDROcodone -acetaminophen  (NORCO) 5-325 MG tablet Take 1 tablet by mouth every 6 (six) hours as needed for moderate pain (pain score 4-6). 30 tablet 0   levothyroxine  (SYNTHROID ) 125 MCG tablet TAKE 1 TABLET(125 MCG) BY MOUTH DAILY BEFORE BREAKFAST 90 tablet 1   magnesium  oxide (MAG-OX) 400 (240 Mg) MG tablet Take 1 tablet (400 mg total) by mouth daily. 30 tablet 4   Multiple Vitamin (MULTIVITAMIN WITH MINERALS) TABS tablet Take 1 tablet by mouth daily.     olmesartan -hydrochlorothiazide (BENICAR  HCT) 20-12.5 MG tablet Take 1 tablet by mouth daily. 30 tablet 5   pazopanib  (VOTRIENT ) 200 MG tablet Take 4 tablets (800 mg total) by mouth daily. Take on an empty stomach, one hour before or two hours after food. 120 tablet 2   prochlorperazine  (COMPAZINE ) 10 MG tablet Take 1 tablet (10 mg total) by mouth every 6 (six) hours as needed. 30 tablet 2   UNABLE TO FIND 6 mastectomy prosthesis and bras. 6 each 0   UNABLE TO FIND Take 237 mLs by mouth in the morning and at bedtime. Med Name: Ensure Plus. DX : E46 and C64 72 each 3   urea   (CARMOL) 10 % cream Apply topically 2 (two) times daily. Apply twice daily to hands 71 g 0   furosemide  (LASIX ) 20 MG tablet Take 2 tablets (40 mg total) by mouth daily. 90 tablet 1   No current facility-administered medications for this visit.   Facility-Administered Medications Ordered in Other Visits  Medication Dose Route Frequency Provider Last Rate Last Admin   0.9 %  sodium chloride  infusion (Manually program via Guardrails IV Fluids)  250 mL Intravenous Continuous Conall Vangorder, MD   Stopped at 06/11/24 1240   0.9 %  sodium chloride  infusion (Manually program via Guardrails IV Fluids)  250 mL Intravenous Continuous Davonna Siad, MD 10 mL/hr at 10/28/24 1129 100 mL at 10/28/24 1129   heparin  lock flush 100 unit/mL  500 Units Intravenous Once Katragadda, Sreedhar, MD        VITALS:  Weight 159 lb 6.4 oz (72.3 kg).  Wt Readings from Last 3 Encounters:  10/28/24 159 lb 6.4 oz (72.3 kg)  08/20/24 145 lb (65.8 kg)  07/12/24 143 lb 1.3 oz (64.9 kg)    Body mass index is 25.73 kg/m.  Performance status (ECOG): 1 - Symptomatic but completely ambulatory  PHYSICAL EXAM:   GENERAL:alert, no distress and comfortable HEENT: Oral malignant lesion extending from the mandibular area into the mouth to under the tongue, hard, nontender. LYMPH:  no palpable lymphadenopathy in the cervical, axillary or inguinal LUNGS: Decreased breath sounds in the right lower lobe, rest clear lung sounds.   HEART: regular rate & rhythm and no murmurs and B/L pedal edema extending upto the thigh ABDOMEN:abdomen soft, non-tender and normal bowel sounds Musculoskeletal:no cyanosis of digits and no clubbing  NEURO: alert & oriented x 3 with fluent speech    LABORATORY DATA:  I have reviewed the data as listed  Lab Results  Component Value Date   WBC 6.0 10/28/2024   NEUTROABS 3.9 10/28/2024   HGB 6.1 (LL) 10/28/2024   HCT 19.8 (L) 10/28/2024   MCV 97.1 10/28/2024   PLT 277 10/28/2024       Chemistry      Component Value Date/Time   NA 134 (L) 10/28/2024 0947   NA 133 (L) 03/23/2024 1018   K 4.4 10/28/2024 0947   CL 96 (L) 10/28/2024 0947   CO2 21 (L) 10/28/2024 0947   BUN 19 10/28/2024 0947   BUN 28 (H) 03/23/2024 1018   CREATININE 0.85 10/28/2024 0947   CREATININE 0.73 04/22/2019 0848      Component Value Date/Time   CALCIUM  8.9 10/28/2024 0947   ALKPHOS 217 (H) 10/28/2024 0947   AST 59 (H) 10/28/2024 0947   ALT 11 10/28/2024 0947   BILITOT 0.4 10/28/2024 0947   BILITOT 0.3 03/23/2024 1018      Latest Reference Range & Units 10/28/24 09:47  Iron  28 - 170 ug/dL 52  UIBC ug/dL 862  TIBC 749 - 549 ug/dL 810 (L)  Saturation Ratios 10.4 - 31.8 % 28  Ferritin 11 - 307 ng/mL 1,503 (H)  Folate >5.9 ng/mL <3.0 (L)  Vitamin B12 180 - 914 pg/mL 690  (L): Data is abnormally low (H): Data is abnormally high   Latest Reference Range & Units 10/28/24 09:47  TSH 0.350 - 4.500 uIU/mL 24.400 (H)  (H): Data is abnormally high   RADIOGRAPHIC STUDIES: I have personally reviewed the radiological images as listed and agreed with the findings in the report. "

## 2024-10-28 ENCOUNTER — Inpatient Hospital Stay: Payer: Medicare (Managed Care)

## 2024-10-28 ENCOUNTER — Inpatient Hospital Stay: Payer: Medicare (Managed Care) | Attending: Hematology | Admitting: Oncology

## 2024-10-28 ENCOUNTER — Telehealth (HOSPITAL_COMMUNITY): Payer: Self-pay | Admitting: Psychiatry

## 2024-10-28 ENCOUNTER — Ambulatory Visit (HOSPITAL_COMMUNITY): Payer: Medicare (Managed Care) | Admitting: Psychiatry

## 2024-10-28 VITALS — BP 111/62 | HR 98 | Temp 96.2°F | Resp 20

## 2024-10-28 VITALS — Wt 159.4 lb

## 2024-10-28 VITALS — BP 108/68 | HR 91 | Temp 97.6°F | Resp 18

## 2024-10-28 DIAGNOSIS — C787 Secondary malignant neoplasm of liver and intrahepatic bile duct: Secondary | ICD-10-CM | POA: Insufficient documentation

## 2024-10-28 DIAGNOSIS — C641 Malignant neoplasm of right kidney, except renal pelvis: Secondary | ICD-10-CM

## 2024-10-28 DIAGNOSIS — D649 Anemia, unspecified: Secondary | ICD-10-CM

## 2024-10-28 DIAGNOSIS — R600 Localized edema: Secondary | ICD-10-CM | POA: Diagnosis not present

## 2024-10-28 DIAGNOSIS — Z905 Acquired absence of kidney: Secondary | ICD-10-CM | POA: Diagnosis not present

## 2024-10-28 DIAGNOSIS — J90 Pleural effusion, not elsewhere classified: Secondary | ICD-10-CM | POA: Insufficient documentation

## 2024-10-28 DIAGNOSIS — C499 Malignant neoplasm of connective and soft tissue, unspecified: Secondary | ICD-10-CM

## 2024-10-28 DIAGNOSIS — E538 Deficiency of other specified B group vitamins: Secondary | ICD-10-CM | POA: Diagnosis not present

## 2024-10-28 DIAGNOSIS — D630 Anemia in neoplastic disease: Secondary | ICD-10-CM | POA: Insufficient documentation

## 2024-10-28 DIAGNOSIS — Z79890 Hormone replacement therapy (postmenopausal): Secondary | ICD-10-CM | POA: Diagnosis not present

## 2024-10-28 DIAGNOSIS — E039 Hypothyroidism, unspecified: Secondary | ICD-10-CM | POA: Diagnosis not present

## 2024-10-28 DIAGNOSIS — Z66 Do not resuscitate: Secondary | ICD-10-CM | POA: Diagnosis not present

## 2024-10-28 DIAGNOSIS — C771 Secondary and unspecified malignant neoplasm of intrathoracic lymph nodes: Secondary | ICD-10-CM | POA: Insufficient documentation

## 2024-10-28 LAB — CBC WITH DIFFERENTIAL/PLATELET
Abs Immature Granulocytes: 0.1 K/uL — ABNORMAL HIGH (ref 0.00–0.07)
Basophils Absolute: 0 K/uL (ref 0.0–0.1)
Basophils Relative: 1 %
Eosinophils Absolute: 0.1 K/uL (ref 0.0–0.5)
Eosinophils Relative: 1 %
HCT: 19.8 % — ABNORMAL LOW (ref 36.0–46.0)
Hemoglobin: 6.1 g/dL — CL (ref 12.0–15.0)
Immature Granulocytes: 2 %
Lymphocytes Relative: 20 %
Lymphs Abs: 1.2 K/uL (ref 0.7–4.0)
MCH: 29.9 pg (ref 26.0–34.0)
MCHC: 30.8 g/dL (ref 30.0–36.0)
MCV: 97.1 fL (ref 80.0–100.0)
Monocytes Absolute: 0.7 K/uL (ref 0.1–1.0)
Monocytes Relative: 12 %
Neutro Abs: 3.9 K/uL (ref 1.7–7.7)
Neutrophils Relative %: 64 %
Platelets: 277 K/uL (ref 150–400)
RBC: 2.04 MIL/uL — ABNORMAL LOW (ref 3.87–5.11)
RDW: 18.9 % — ABNORMAL HIGH (ref 11.5–15.5)
WBC: 6 K/uL (ref 4.0–10.5)
nRBC: 0.5 % — ABNORMAL HIGH (ref 0.0–0.2)

## 2024-10-28 LAB — COMPREHENSIVE METABOLIC PANEL WITH GFR
ALT: 11 U/L (ref 0–44)
AST: 59 U/L — ABNORMAL HIGH (ref 15–41)
Albumin: 2.8 g/dL — ABNORMAL LOW (ref 3.5–5.0)
Alkaline Phosphatase: 217 U/L — ABNORMAL HIGH (ref 38–126)
Anion gap: 16 — ABNORMAL HIGH (ref 5–15)
BUN: 19 mg/dL (ref 8–23)
CO2: 21 mmol/L — ABNORMAL LOW (ref 22–32)
Calcium: 8.9 mg/dL (ref 8.9–10.3)
Chloride: 96 mmol/L — ABNORMAL LOW (ref 98–111)
Creatinine, Ser: 0.85 mg/dL (ref 0.44–1.00)
GFR, Estimated: >60 mL/min
Glucose, Bld: 64 mg/dL — ABNORMAL LOW (ref 70–99)
Potassium: 4.4 mmol/L (ref 3.5–5.1)
Sodium: 134 mmol/L — ABNORMAL LOW (ref 135–145)
Total Bilirubin: 0.4 mg/dL (ref 0.0–1.2)
Total Protein: 7.2 g/dL (ref 6.5–8.1)

## 2024-10-28 LAB — T4, FREE: Free T4: 0.66 ng/dL — ABNORMAL LOW (ref 0.80–2.00)

## 2024-10-28 LAB — FOLATE: Folate: <3 ng/mL — ABNORMAL LOW

## 2024-10-28 LAB — IRON AND TIBC
Iron: 52 ug/dL (ref 28–170)
Saturation Ratios: 28 % (ref 10.4–31.8)
TIBC: 189 ug/dL — ABNORMAL LOW (ref 250–450)
UIBC: 137 ug/dL

## 2024-10-28 LAB — TSH: TSH: 24.4 u[IU]/mL — ABNORMAL HIGH (ref 0.350–4.500)

## 2024-10-28 LAB — FERRITIN: Ferritin: 1503 ng/mL — ABNORMAL HIGH (ref 11–307)

## 2024-10-28 LAB — SAMPLE TO BLOOD BANK

## 2024-10-28 LAB — PREPARE RBC (CROSSMATCH)

## 2024-10-28 LAB — VITAMIN B12: Vitamin B-12: 690 pg/mL (ref 180–914)

## 2024-10-28 MED ORDER — SODIUM CHLORIDE 0.9% IV SOLUTION
250.0000 mL | INTRAVENOUS | Status: DC
  Administered 2024-10-28: 100 mL via INTRAVENOUS

## 2024-10-28 MED ORDER — ACETAMINOPHEN 325 MG PO TABS
650.0000 mg | ORAL_TABLET | Freq: Once | ORAL | Status: AC
  Administered 2024-10-28: 650 mg via ORAL
  Filled 2024-10-28: qty 2

## 2024-10-28 MED ORDER — FOLIC ACID 1 MG PO TABS: 1.0000 mg | ORAL_TABLET | Freq: Every day | ORAL | 3 refills | Status: DC

## 2024-10-28 MED ORDER — DIPHENHYDRAMINE HCL 25 MG PO TABS
25.0000 mg | ORAL_TABLET | Freq: Once | ORAL | Status: AC
  Administered 2024-10-28: 25 mg via ORAL
  Filled 2024-10-28: qty 1

## 2024-10-28 MED ORDER — FUROSEMIDE 20 MG PO TABS: 40.0000 mg | ORAL_TABLET | Freq: Every day | ORAL | 1 refills | Status: AC

## 2024-10-28 NOTE — Progress Notes (Signed)
 Patient tolerated transfusion with no complaints voiced.  Side effects with management reviewed with understanding verbalized.  Port site clean and dry with no bruising or swelling noted at site.  Good blood return noted before and after administration.  Band aid applied.  Patient left in satisfactory condition with VSS and no s/s of distress noted.

## 2024-10-28 NOTE — Patient Instructions (Signed)

## 2024-10-28 NOTE — Patient Instructions (Signed)
 [facility] at [facility] Discharge Instructions   You were seen and examined today by Dr. Davonna.  She reviewed the results of your lab work which are mostly normal/stable. Your hemoglobin is very low at 6.1.   We will give you 2 units of blood today.  We will check your labs weekly to monitor your hemoglobin.    Return as scheduled.    Thank you for choosing [facility] at [facility] to provide your oncology and hematology care.  To afford each patient quality time with our provider, please arrive at least 15 minutes before your scheduled appointment time.   If you have a lab appointment with the Cancer Center please come in thru the Main Entrance and check in at the main information desk.  You need to re-schedule your appointment should you arrive 10 or more minutes late.  We strive to give you quality time with our providers, and arriving late affects you and other patients whose appointments are after yours.  Also, if you no show three or more times for appointments you may be dismissed from the clinic at the providers discretion.     Again, thank you for choosing [facility].  Our hope is that these requests will decrease the amount of time that you wait before being seen by our physicians.       _____________________________________________________________  Should you have questions after your visit to [facility], please contact our office at [phone] and follow the prompts.  Our office hours are 8:00 a.m. and 4:30 p.m. Monday - Friday.  Please note that voicemails left after 4:00 p.m. may not be returned until the following business day.  We are closed weekends and major holidays.  You do have access to a nurse 24-7, just call the main number to the clinic [phone] and do not press any options, hold on the line and a nurse will answer the phone.    For prescription refill requests, have your pharmacy  contact our office and allow 72 hours.    Due to Covid, you will need to wear a mask upon entering the hospital. If you do not have a mask, a mask will be given to you at the Main Entrance upon arrival. For doctor visits, patients may have 1 support person age 18 or older with them. For treatment visits, patients can not have anyone with them due to social distancing guidelines and our immunocompromised population.

## 2024-10-28 NOTE — Progress Notes (Signed)
 CRITICAL VALUE ALERT Critical value received:  hgb 6.1 Date of notification:  10-28-24 Time of notification: 1017 Critical value read back:  Yes.   Nurse who received alert:  Dorothyann Oman RN MD notified time and response:  Dr. Davonna,  will give 2 units of blood per protocol

## 2024-10-28 NOTE — Telephone Encounter (Signed)
Therapist attempted to contact patient via phone regarding scheduled in office appointment and received voicemail recording.  Therapist left message indicating attempt and requesting patient call office. 

## 2024-10-28 NOTE — Progress Notes (Signed)
 Port flushed with good blood return noted. No bruising or swelling at site. Patient sent to waiting room for office visit. VVS stable with no signs or symptoms of distressed noted.

## 2024-10-29 ENCOUNTER — Other Ambulatory Visit: Payer: Self-pay

## 2024-10-29 LAB — TYPE AND SCREEN
ABO/RH(D): O POS
Antibody Screen: NEGATIVE
Unit division: 0
Unit division: 0

## 2024-10-29 LAB — BPAM RBC
Blood Product Expiration Date: 202602022359
Blood Product Expiration Date: 202602022359
ISSUE DATE / TIME: 202601221137
ISSUE DATE / TIME: 202601221319
Unit Type and Rh: 5100
Unit Type and Rh: 5100

## 2024-10-29 MED ORDER — FOLIC ACID 1 MG PO TABS: 1.0000 mg | ORAL_TABLET | Freq: Every day | ORAL | 3 refills | Status: AC

## 2024-11-03 LAB — COMPREHENSIVE METABOLIC PANEL WITH GFR
ALT: 13 [IU]/L (ref 0–32)
AST: 32 [IU]/L (ref 0–40)
Albumin: 3.1 g/dL — ABNORMAL LOW (ref 3.9–4.9)
Alkaline Phosphatase: 251 [IU]/L — ABNORMAL HIGH (ref 49–135)
BUN/Creatinine Ratio: 20 (ref 12–28)
BUN: 19 mg/dL (ref 8–27)
Bilirubin Total: 1.2 mg/dL (ref 0.0–1.2)
CO2: 19 mmol/L — ABNORMAL LOW (ref 20–29)
Calcium: 9.3 mg/dL (ref 8.7–10.3)
Chloride: 94 mmol/L — ABNORMAL LOW (ref 96–106)
Creatinine, Ser: 0.97 mg/dL (ref 0.57–1.00)
Globulin, Total: 4.4 g/dL (ref 1.5–4.5)
Glucose: 64 mg/dL — ABNORMAL LOW (ref 70–99)
Potassium: 5.1 mmol/L (ref 3.5–5.2)
Sodium: 134 mmol/L (ref 134–144)
Total Protein: 7.5 g/dL (ref 6.0–8.5)
eGFR: 65 mL/min/{1.73_m2}

## 2024-11-03 LAB — GAMMA GT: GGT: 142 [IU]/L — ABNORMAL HIGH (ref 0–60)

## 2024-11-03 LAB — FIB-4 W/REFLEX TO ELF
FIB-4 Index: 2.08 (ref 0.00–2.67)
Platelets: 277 10*3/uL (ref 150–450)

## 2024-11-03 LAB — ENHANCED LIVER FIBROSIS (ELF): ELF(TM) Score: 11.54 — ABNORMAL HIGH

## 2024-11-03 LAB — LIPID PANEL
Chol/HDL Ratio: 3.4 ratio (ref 0.0–4.4)
Cholesterol, Total: 143 mg/dL (ref 100–199)
HDL: 42 mg/dL
LDL Chol Calc (NIH): 65 mg/dL (ref 0–99)
Triglycerides: 217 mg/dL — ABNORMAL HIGH (ref 0–149)
VLDL Cholesterol Cal: 36 mg/dL (ref 5–40)

## 2024-11-03 LAB — TSH: TSH: 28.7 u[IU]/mL — ABNORMAL HIGH (ref 0.450–4.500)

## 2024-11-03 LAB — VITAMIN D 25 HYDROXY (VIT D DEFICIENCY, FRACTURES): Vit D, 25-Hydroxy: 17.2 ng/mL — ABNORMAL LOW (ref 30.0–100.0)

## 2024-11-03 LAB — T4, FREE: Free T4: 0.64 ng/dL — ABNORMAL LOW (ref 0.82–1.77)

## 2024-11-03 LAB — ALKALINE PHOSPHATASE, BONE SPECIFIC: Tandem-R Ostase: 21.6 ug/L

## 2024-11-04 ENCOUNTER — Telehealth: Payer: Self-pay | Admitting: *Deleted

## 2024-11-04 ENCOUNTER — Inpatient Hospital Stay: Payer: Medicare (Managed Care)

## 2024-11-04 ENCOUNTER — Ambulatory Visit (HOSPITAL_COMMUNITY)
Admission: RE | Admit: 2024-11-04 | Discharge: 2024-11-04 | Disposition: A | Discharge status: Home / Self Care | Payer: Medicare (Managed Care) | Source: Ambulatory Visit | Attending: Oncology | Admitting: Oncology

## 2024-11-04 ENCOUNTER — Other Ambulatory Visit: Payer: Self-pay | Admitting: *Deleted

## 2024-11-04 ENCOUNTER — Ambulatory Visit: Payer: Medicare (Managed Care) | Admitting: "Endocrinology

## 2024-11-04 VITALS — BP 103/55 | HR 90 | Temp 96.2°F | Resp 18

## 2024-11-04 DIAGNOSIS — J90 Pleural effusion, not elsewhere classified: Secondary | ICD-10-CM

## 2024-11-04 DIAGNOSIS — D649 Anemia, unspecified: Secondary | ICD-10-CM

## 2024-11-04 DIAGNOSIS — C641 Malignant neoplasm of right kidney, except renal pelvis: Secondary | ICD-10-CM | POA: Insufficient documentation

## 2024-11-04 LAB — CBC WITH DIFFERENTIAL/PLATELET
Abs Immature Granulocytes: 0.05 10*3/uL (ref 0.00–0.07)
Basophils Absolute: 0 10*3/uL (ref 0.0–0.1)
Basophils Relative: 0 %
Eosinophils Absolute: 0 10*3/uL (ref 0.0–0.5)
Eosinophils Relative: 0 %
HCT: 23.9 % — ABNORMAL LOW (ref 36.0–46.0)
Hemoglobin: 7.3 g/dL — ABNORMAL LOW (ref 12.0–15.0)
Immature Granulocytes: 1 %
Lymphocytes Relative: 19 %
Lymphs Abs: 1 10*3/uL (ref 0.7–4.0)
MCH: 29.8 pg (ref 26.0–34.0)
MCHC: 30.5 g/dL (ref 30.0–36.0)
MCV: 97.6 fL (ref 80.0–100.0)
Monocytes Absolute: 0.7 10*3/uL (ref 0.1–1.0)
Monocytes Relative: 14 %
Neutro Abs: 3.3 10*3/uL (ref 1.7–7.7)
Neutrophils Relative %: 66 %
Platelets: 232 10*3/uL (ref 150–400)
RBC: 2.45 MIL/uL — ABNORMAL LOW (ref 3.87–5.11)
RDW: 18.6 % — ABNORMAL HIGH (ref 11.5–15.5)
WBC: 5.1 10*3/uL (ref 4.0–10.5)
nRBC: 0 % (ref 0.0–0.2)

## 2024-11-04 LAB — PREPARE RBC (CROSSMATCH)

## 2024-11-04 LAB — SAMPLE TO BLOOD BANK

## 2024-11-04 NOTE — Patient Instructions (Signed)
 [facility] [place] - A DEPT OF [facility]  Discharge Instructions: Thank you for choosing [facility] to provide your oncology and hematology care.  If you have a lab appointment with the Cancer Center - please note that after April 8th, 2024, all labs will be drawn in the cancer center.  You do not have to check in or register with the main entrance as you have in the past but will complete your check-in in the cancer center.  Wear comfortable clothing and clothing appropriate for easy access to any Portacath or PICC line.   We strive to give you quality time with your provider. You may need to reschedule your appointment if you arrive late (15 or more minutes).  Arriving late affects you and other patients whose appointments are after yours.  Also, if you miss three or more appointments without notifying the office, you may be dismissed from the clinic at the providers discretion.      For prescription refill requests, have your pharmacy contact our office and allow 72 hours for refills to be completed.    Today you received the following port flush with labs return as scheduled.   To help prevent nausea and vomiting after your treatment, we encourage you to take your nausea medication as directed.  BELOW ARE SYMPTOMS THAT SHOULD BE REPORTED IMMEDIATELY: *FEVER GREATER THAN 100.4 F (38 C) OR HIGHER *CHILLS OR SWEATING *NAUSEA AND VOMITING THAT IS NOT CONTROLLED WITH YOUR NAUSEA MEDICATION *UNUSUAL SHORTNESS OF BREATH *UNUSUAL BRUISING OR BLEEDING *URINARY PROBLEMS (pain or burning when urinating, or frequent urination) *BOWEL PROBLEMS (unusual diarrhea, constipation, pain near the anus) TENDERNESS IN MOUTH AND THROAT WITH OR WITHOUT PRESENCE OF ULCERS (sore throat, sores in mouth, or a toothache) UNUSUAL RASH, SWELLING OR PAIN  UNUSUAL VAGINAL DISCHARGE OR ITCHING   Items with * indicate a potential emergency and should be followed up as soon as  possible or go to the Emergency Department if any problems should occur.  Please show the CHEMOTHERAPY ALERT CARD or IMMUNOTHERAPY ALERT CARD at check-in to the Emergency Department and triage nurse.  Should you have questions after your visit or need to cancel or reschedule your appointment, please contact [facility] [place] - A DEPT OF [name] [phone]  and follow the prompts.  Office hours are 8:00 a.m. to 4:30 p.m. Monday - Friday. Please note that voicemails left after 4:00 p.m. may not be returned until the following business day.  We are closed weekends and major holidays. You have access to a nurse at all times for urgent questions. Please call the main number to the clinic [phone] and follow the prompts.  For any non-urgent questions, you may also contact your provider using MyChart. We now offer e-Visits for anyone 34 and older to request care online for non-urgent symptoms. For details visit [email or web address].   Also download the MyChart app! Go to the app store, search MyChart, open the app, select [place], and log in with your MyChart username and password.

## 2024-11-04 NOTE — Addendum Note (Signed)
 Addended by: TAMELA LEACH D on: 11/04/2024 01:41 PM   Modules accepted: Orders

## 2024-11-04 NOTE — Progress Notes (Signed)
 HGB 7.3 today. Message sent to Dr. Davonna / A.Lenon PEAK. Standing orders followed.  Called and spoke with patient and instructed patient to be at the [facility] at 09:30 am for 1 unit of blood. Understanding verbalized.

## 2024-11-04 NOTE — Progress Notes (Signed)
 Port flushed with good blood return noted. No bruising or swelling at site. Bandaid applied and patient discharged in satisfactory condition. VVS stable with no signs or symptoms of distressed noted.

## 2024-11-04 NOTE — Telephone Encounter (Signed)
 Per Dr. Davonna, patient requires a repeat Thoracentesis for pleural effusion.  Order placed and scheduled for tomorrow @ 1:00.  Per Radiology, patient advised if she came around 11:00 they may be able to fit her in earlier.  Patient notified and verbalized understanding.

## 2024-11-05 ENCOUNTER — Inpatient Hospital Stay: Payer: Medicare (Managed Care)

## 2024-11-05 ENCOUNTER — Ambulatory Visit (HOSPITAL_COMMUNITY)
Admission: RE | Admit: 2024-11-05 | Discharge: 2024-11-05 | Disposition: A | Payer: Medicare (Managed Care) | Source: Ambulatory Visit | Attending: Physician Assistant

## 2024-11-05 ENCOUNTER — Ambulatory Visit (HOSPITAL_COMMUNITY)
Admission: RE | Admit: 2024-11-05 | Discharge: 2024-11-05 | Disposition: A | Payer: Medicare (Managed Care) | Source: Ambulatory Visit | Attending: Oncology

## 2024-11-05 DIAGNOSIS — J90 Pleural effusion, not elsewhere classified: Secondary | ICD-10-CM | POA: Insufficient documentation

## 2024-11-05 DIAGNOSIS — Z9889 Other specified postprocedural states: Secondary | ICD-10-CM | POA: Insufficient documentation

## 2024-11-05 DIAGNOSIS — D649 Anemia, unspecified: Secondary | ICD-10-CM

## 2024-11-05 DIAGNOSIS — C641 Malignant neoplasm of right kidney, except renal pelvis: Secondary | ICD-10-CM | POA: Diagnosis not present

## 2024-11-05 MED ORDER — LIDOCAINE HCL (PF) 2 % IJ SOLN
10.0000 mL | Freq: Once | INTRAMUSCULAR | Status: AC
  Administered 2024-11-05: 10 mL via INTRADERMAL

## 2024-11-05 MED ORDER — ACETAMINOPHEN 325 MG PO TABS
650.0000 mg | ORAL_TABLET | Freq: Once | ORAL | Status: AC
  Administered 2024-11-05: 650 mg via ORAL
  Filled 2024-11-05: qty 2

## 2024-11-05 MED ORDER — SODIUM CHLORIDE 0.9% IV SOLUTION
250.0000 mL | INTRAVENOUS | Status: DC
  Administered 2024-11-05: 250 mL via INTRAVENOUS

## 2024-11-05 MED ORDER — FUROSEMIDE 10 MG/ML IJ SOLN
40.0000 mg | Freq: Once | INTRAMUSCULAR | Status: AC
  Administered 2024-11-05: 40 mg via INTRAVENOUS
  Filled 2024-11-05: qty 4

## 2024-11-05 MED ORDER — DIPHENHYDRAMINE HCL 25 MG PO CAPS: 25.0000 mg | ORAL_CAPSULE | Freq: Once | ORAL | Status: DC

## 2024-11-05 MED ORDER — LIDOCAINE HCL (PF) 2 % IJ SOLN
INTRAMUSCULAR | Status: AC
  Filled 2024-11-05: qty 10

## 2024-11-05 MED ORDER — DIPHENHYDRAMINE HCL 25 MG PO TABS
25.0000 mg | ORAL_TABLET | Freq: Once | ORAL | Status: AC
  Administered 2024-11-05: 25 mg via ORAL
  Filled 2024-11-05: qty 1

## 2024-11-05 NOTE — Procedures (Signed)
 PROCEDURE SUMMARY:  Successful image-guided right thoracentesis. Yielded 700 milliliters of clear yellow fluid. Patient tolerated procedure well. EBL < 1 mL. No immediate complications.  Specimen was not sent for labs. Post procedure CXR shows no pneumothorax.  Please see imaging section of Epic for full dictation.  Clotilda DELENA Hesselbach PA-C 11/05/2024 1:16 PM

## 2024-11-05 NOTE — Progress Notes (Signed)
 Patient brought to US  Rm1 in no acute distress. Thoracentesis explained, consent obtained. US  imaging obtained. Local anesthetic admin without adverse reaction. Access obtained at R post thorax. Clear amber fluid retrieved under vacutainer suction. 700cc total volume collected. Tolerated well. Access removed without comlpication. Bandage placed, no bleeding or hematoma. Transported to xray for image.

## 2024-11-05 NOTE — Patient Instructions (Signed)
 Getting Blood Through an IV (Blood Transfusion) in Adults: What to Know After After a blood transfusion, it is common to have: Bruising and soreness at the IV site. A headache. Follow these instructions at home: Your doctor may give you more instructions. If you have problems, contact your doctor. Insertion site care     Follow instructions from your doctor about how to take care of your insertion site. This is where an IV tube was put into your vein. Make sure you: Wash your hands with soap and water  for at least 20 seconds before and after you change your bandage. If you cannot use soap and water , use hand sanitizer. Change your bandage as told by your doctor. Check your insertion site every day for signs of infection. Check for: Redness, swelling, or pain. Bleeding from the site. Warmth. Pus or a bad smell. General instructions Take over-the-counter and prescription medicines only as told by your doctor. Rest as told by your doctor. Go back to your normal activities as told by your doctor. Keep all follow-up visits. You may need to have tests at certain times to check your blood. Contact a doctor if: You have itching or red, swollen areas of skin (hives). You have a fever or chills. You have pain in the head, back, or chest. You feel worried or nervous (anxious). You feel weak after doing your normal activities. You have any of these problems at the insertion site: Redness, swelling, warmth, or pain. Bleeding that does not stop with pressure. Pus or a bad smell. If you received your blood transfusion in an outpatient setting, you will be told whom to contact to report any reactions. Get help right away if: You have signs of a serious reaction. This may be coming from an allergy or the body's defense system (immune system). Signs include: Trouble breathing or shortness of breath. Swelling of the face or feeling warm (flushed). A widespread rash. Dark pee (urine) or blood in  the pee. Fast heartbeat. These symptoms may be an emergency. Get help right away. Call 911. Do not wait to see if the symptoms will go away. Do not drive yourself to the hospital. Summary Bruising and soreness at the IV site are common. Check your insertion site every day for signs of infection. Rest as told by your doctor. Go back to your normal activities as told by your doctor. Get help right away if you have signs of a serious reaction. This information is not intended to replace advice given to you by your health care provider. Make sure you discuss any questions you have with your health care provider. Document Revised: 07/29/2024 Document Reviewed: 12/21/2021 Elsevier Patient Education  2025 [employer].

## 2024-11-06 LAB — BPAM RBC
Blood Product Expiration Date: 202602102359
ISSUE DATE / TIME: 202601301015
Unit Type and Rh: 5100

## 2024-11-06 LAB — TYPE AND SCREEN
ABO/RH(D): O POS
Antibody Screen: NEGATIVE
Unit division: 0

## 2024-11-10 ENCOUNTER — Ambulatory Visit: Payer: Medicare (Managed Care) | Admitting: "Endocrinology

## 2024-11-10 ENCOUNTER — Encounter: Payer: Self-pay | Admitting: "Endocrinology

## 2024-11-10 VITALS — BP 120/62 | HR 60 | Ht 66.0 in | Wt 166.0 lb

## 2024-11-10 DIAGNOSIS — E782 Mixed hyperlipidemia: Secondary | ICD-10-CM

## 2024-11-10 DIAGNOSIS — E119 Type 2 diabetes mellitus without complications: Secondary | ICD-10-CM | POA: Diagnosis not present

## 2024-11-10 DIAGNOSIS — E890 Postprocedural hypothyroidism: Secondary | ICD-10-CM | POA: Diagnosis not present

## 2024-11-10 DIAGNOSIS — R748 Abnormal levels of other serum enzymes: Secondary | ICD-10-CM

## 2024-11-10 DIAGNOSIS — K760 Fatty (change of) liver, not elsewhere classified: Secondary | ICD-10-CM | POA: Insufficient documentation

## 2024-11-10 LAB — POCT GLYCOSYLATED HEMOGLOBIN (HGB A1C): HbA1c, POC (controlled diabetic range): 5.6 % (ref 0.0–7.0)

## 2024-11-10 MED ORDER — LEVOTHYROXINE SODIUM 150 MCG PO TABS: 150.0000 ug | ORAL_TABLET | Freq: Every day | ORAL | 1 refills | Status: AC

## 2024-11-10 NOTE — Progress Notes (Signed)
 "                                                                            11/10/2024, 12:57 PM  Endocrinology follow-up note   Deanna Bush is a 64 y.o.-year-old female patient being seen for follow-up after she was seen in consultation for hypothyroidism. PMD:  [name], MD.   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    Cellulitis of leg, left    Complication of anesthesia    Hard to wake up   Depression    Diabetes mellitus without complication (HCC)    Pt denies   Family history of breast cancer 08/16/2011   Family history of breast cancer    Family history of colon cancer    Family history of kidney cancer    Family history of prostate cancer    Hyperlipidemia    Hypertension    Hypothyroidism    MRSA (methicillin resistant staph aureus) culture positive 08/19/2011   Pre-diabetes     Past Surgical History:  Procedure Laterality Date   BREAST SURGERY Left    mastectomy   CATARACT EXTRACTION W/PHACO Right 06/06/2023   Procedure: CATARACT EXTRACTION PHACO AND INTRAOCULAR LENS PLACEMENT (IOC);  Surgeon: Harrie Agent, MD;  Location: [facility];  Service: Ophthalmology;  Laterality: Right;  CDE 5.61   CATARACT EXTRACTION W/PHACO Left 07/21/2023   Procedure: CATARACT EXTRACTION PHACO AND INTRAOCULAR LENS PLACEMENT (IOC);  Surgeon: Harrie Agent, MD;  Location: [facility];  Service: Ophthalmology;  Laterality: Left;  CDE: 5.96   CESAREAN SECTION     x2   COLONOSCOPY N/A 03/03/2020   Procedure: COLONOSCOPY;  Surgeon: Shaaron Lamar CHRISTELLA, MD;  Location: [facility];  Service: Endoscopy;  Laterality: N/A;  12:00   IR IMAGING GUIDED PORT INSERTION  11/07/2021   left mastectomy     NEPHRECTOMY Right 02/06/2021   Procedure: NEPHRECTOMY- open radical;  Surgeon: Sherrilee Belvie CROME, MD;  Location: [facility];  Service: Urology;  Laterality: Right;   POLYPECTOMY  03/03/2020   Procedure: POLYPECTOMY;  Surgeon: Shaaron Lamar CHRISTELLA, MD;  Location: [facility];  Service: Endoscopy;;    Social History   Socioeconomic History   Marital status: Married    Spouse name: Not on file   Number of children: 2   Years of education: Not on file   Highest education level: GED or equivalent  Occupational History   Occupation: CNA  Tobacco Use   Smoking status: Former    Current packs/day: 0.00    Average packs/day: 0.5 packs/day for 18.0 years (9.0 ttl pk-yrs)    Types: Cigarettes    Start date: 07/01/1984    Quit date: 07/01/2002    Years since quitting: 22.3   Smokeless tobacco: Never  Vaping Use   Vaping status: Never Used  Substance and Sexual Activity   Alcohol use: No   Drug use: No   Sexual activity: Yes    Birth control/protection: None  Other Topics Concern   Not on file  Social History Narrative   Not on file   Social Drivers of Health   Tobacco Use: Medium Risk (11/10/2024)  Patient History    Smoking Tobacco Use: Former    Smokeless Tobacco Use: Never    Passive Exposure: Not on file  Financial Resource Strain: Medium Risk (08/14/2023)   Overall Financial Resource Strain (CARDIA)    Difficulty of Paying Living Expenses: Somewhat hard  Food Insecurity: No Food Insecurity (05/14/2024)   Received [facility]   Epic    Within the past 12 months, you worried that your food would run out before you got the money to buy more.: Never true    Within the past 12 months, the food you bought just didn't last and you didn't have money to get more.: Never true  Transportation Needs: No Transportation Needs (05/14/2024)   Received from [facility] - Eat - Transportation    Lack of Transportation (Medical): No    Lack of Transportation (Non-Medical): No  Physical Activity: Insufficiently Active (12/08/2023)   Exercise Vital Sign    Days of Exercise per Week: 3 days    Minutes of Exercise per Session: 20 min  Stress: No Stress Concern Present (12/08/2023)   [employer] of [employer]ational [employer] - Occupational Stress  Questionnaire    Feeling of Stress : Only a little  Social Connections: Moderately Integrated (12/08/2023)   Social Connection and Isolation Panel    Frequency of Communication with Friends and Family: More than three times a week    Frequency of Social Gatherings with Friends and Family: More than three times a week    Attends Religious Services: More than 4 times per year    Active Member of Clubs or Organizations: Yes    Attends Banker Meetings: More than 4 times per year    Marital Status: Separated  Depression (PHQ2-9): Low Risk (11/05/2024)   Depression (PHQ2-9)    PHQ-2 Score: 0  Alcohol Screen: Low Risk (12/08/2023)   Alcohol Screen    Last Alcohol Screening Score (AUDIT): 0  Housing: Low Risk (12/08/2023)   Housing Stability Vital Sign    Unable to Pay for Housing in the Last Year: No    Number of Times Moved in the Last Year: 0    Homeless in the Last Year: No  Utilities: Low Risk (05/14/2024)   Received from [facility]   Utilities    Within the past 12 months, have you been unable to get utilities(heat, electricity) when it was really needed?: No  Health Literacy: Not on file    Family History  Problem Relation Age of Onset   Alcohol abuse Mother    Hypertension Mother    Diabetes Mother    Hyperlipidemia Mother    Colon cancer Maternal Aunt        dx 23s   Prostate cancer Maternal Uncle    Throat cancer Maternal Uncle    Kidney cancer Maternal Grandfather    Lung cancer Half-Sister    Kidney cancer Half-Sister 51   Breast cancer Half-Sister 18   Breast cancer Half-Sister 1    Outpatient Encounter Medications as of 11/10/2024  Medication Sig   acetaminophen  (TYLENOL ) 500 MG tablet Take 500 mg by mouth every 6 (six) hours as needed for moderate pain or headache.   amoxicillin -clavulanate (AUGMENTIN ) 875-125 MG tablet Take 1 tablet by mouth 2 (two) times daily.   atorvastatin  (LIPITOR) 40 MG tablet Take 1 tablet (40 mg total) by mouth daily.    busPIRone  (BUSPAR ) 5 MG tablet Take one tablet by mouth twice  daily as needed, for  anxiety   EPINEPHrine  0.3 mg/0.3 mL IJ SOAJ injection Inject 0.3 mg into the muscle as needed for anaphylaxis.   folic acid  (FOLVITE ) 1 MG tablet Take 1 tablet (1 mg total) by mouth daily.   furosemide  (LASIX ) 20 MG tablet Take 2 tablets (40 mg total) by mouth daily.   HYDROcodone -acetaminophen  (NORCO) 5-325 MG tablet Take 1 tablet by mouth every 6 (six) hours as needed for moderate pain (pain score 4-6).   levothyroxine  (SYNTHROID ) 150 MCG tablet Take 1 tablet (150 mcg total) by mouth daily before breakfast.   magnesium  oxide (MAG-OX) 400 (240 Mg) MG tablet Take 1 tablet (400 mg total) by mouth daily.   Multiple Vitamin (MULTIVITAMIN WITH MINERALS) TABS tablet Take 1 tablet by mouth daily.   olmesartan -hydrochlorothiazide (BENICAR  HCT) 20-12.5 MG tablet Take 1 tablet by mouth daily.   pazopanib  (VOTRIENT ) 200 MG tablet Take 4 tablets (800 mg total) by mouth daily. Take on an empty stomach, one hour before or two hours after food.   prochlorperazine  (COMPAZINE ) 10 MG tablet Take 1 tablet (10 mg total) by mouth every 6 (six) hours as needed.   UNABLE TO FIND 6 mastectomy prosthesis and bras.   UNABLE TO FIND Take 237 mLs by mouth in the morning and at bedtime. Med Name: Ensure Plus. DX : E46 and C64   urea  (CARMOL) 10 % cream Apply topically 2 (two) times daily. Apply twice daily to hands   [DISCONTINUED] levothyroxine  (SYNTHROID ) 125 MCG tablet TAKE 1 TABLET(125 MCG) BY MOUTH DAILY BEFORE BREAKFAST   Facility-Administered Encounter Medications as of 11/10/2024  Medication   0.9 %  sodium chloride  infusion (Manually program via Guardrails IV Fluids)   heparin  lock flush 100 unit/mL    ALLERGIES: Allergies  Allergen Reactions   Amlodipine  Swelling    Leg edema   Sulfonamide Derivatives Itching   Bee Venom Rash and Dermatitis    urticaria   VACCINATION STATUS: Immunization History  Administered Date(s)  Administered   Influenza Split 08/06/2012   Influenza, Seasonal, Injecte, Preservative Fre 08/14/2023, 07/12/2024   Influenza,inj,Quad PF,6+ Mos 07/22/2018, 06/28/2019, 06/28/2020, 07/10/2021   Influenza-Unspecified 07/07/2022   Moderna Sars-Covid-2 Vaccination 12/07/2019, 01/07/2020, 08/09/2020, 04/05/2021   Pneumococcal Conjugate-13 03/29/2015   Pneumococcal Polysaccharide-23 04/18/2021   Tdap 03/12/2011, 07/12/2021   Zoster Recombinant(Shingrix ) 05/05/2018, 07/02/2018     HPI    KANDIE KEIPER  is a patient with the above medical history.  Her history starts at approximate age of 20 when she had Graves' disease which required RAI thyroid  ablation.  She is currently on levothyroxine  125 mcg p.o. daily before breakfast.   Her previsit thyroid  function test consistent with under replacement.   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  Her lab work continues to show elevated alk phos, elevated GGT, elevated ELF.  She follows at oncology as well as gastroenterology.    She denies fatigue, palpitations, tremors, heat/cold intolerance.  -She denies dysphagia, shortness of breath, no voice change.  She underwent thyroid  ultrasound recently which did not show significant goiter , nor any discrete thyroid  nodules.    she has family history of  thyroid  disorders in her mother, but no family history of thyroid  cancer.  -He is a former smoker.   Her medical history also includes hyperlipidemia, type 2 diabetes.  She is not on medications.  Her point-of-care A1c is 5.6%.    ROS:  Limited as above.   Physical Exam: BP  120/62   Pulse 60   Ht 5' 6 (1.676 m)   Wt 166 lb (75.3 kg)   BMI 26.79 kg/m  Wt Readings from Last 3 Encounters:  11/10/24 166 lb (75.3 kg)  10/28/24 159 lb 6.4 oz (72.3 kg)  08/20/24 145 lb (65.8 kg)    Constitutional:  Body mass index is 26.79 kg/m., not in acute distress, normal state of  mind   CMP ( most recent) CMP     Component Value Date/Time   NA 134 10/28/2024 1456   K 5.1 10/28/2024 1456   CL 94 (L) 10/28/2024 1456   CO2 19 (L) 10/28/2024 1456   GLUCOSE 64 (L) 10/28/2024 1456   GLUCOSE 64 (L) 10/28/2024 0947   BUN 19 10/28/2024 1456   CREATININE 0.97 10/28/2024 1456   CREATININE 0.73 04/22/2019 0848   CALCIUM  9.3 10/28/2024 1456   PROT 7.5 10/28/2024 1456   ALBUMIN 3.1 (L) 10/28/2024 1456   AST 32 10/28/2024 1456   ALT 13 10/28/2024 1456   ALKPHOS 251 (H) 10/28/2024 1456   BILITOT 1.2 10/28/2024 1456   GFRNONAA >60 10/28/2024 0947   GFRNONAA 90 04/22/2019 0848   GFRAA 96 10/30/2020 0914   GFRAA 104 04/22/2019 0848     Diabetic Labs (most recent): Lab Results  Component Value Date   HGBA1C 5.6 11/10/2024   HGBA1C 5.6 03/31/2024   HGBA1C 5.6 12/02/2023   MICROALBUR 0.2 10/23/2018   MICROALBUR <0.2 06/17/2017   MICROALBUR <3.0 (H) 04/03/2016     Lipid Panel ( most recent) Lipid Panel     Component Value Date/Time   CHOL 143 10/28/2024 1456   TRIG 217 (H) 10/28/2024 1456   HDL 42 10/28/2024 1456   CHOLHDL 3.4 10/28/2024 1456   CHOLHDL 2.7 10/23/2018 1148   VLDL 7 08/12/2016 0913   LDLCALC 65 10/28/2024 1456   LDLCALC 100 (H) 10/23/2018 1148   LABVLDL 36 10/28/2024 1456       Lab Results  Component Value Date   TSH 28.700 (H) 10/28/2024   TSH 24.400 (H) 10/28/2024   TSH 15.637 (H) 06/10/2024   TSH 8.820 (H) 03/23/2024   TSH 29.357 (H) 12/29/2023   TSH 49.145 (H) 12/01/2023   TSH 72.400 (H) 11/24/2023   TSH 93.092 (H) 11/17/2023   TSH 36.100 (H) 07/21/2023   TSH 57.200 (H) 01/13/2023   FREET4 0.64 (L) 10/28/2024   FREET4 0.66 (L) 10/28/2024   FREET4 1.75 03/23/2024   FREET4 1.08 11/24/2023   FREET4 1.13 07/21/2023   FREET4 0.94 01/13/2023   FREET4 1.01 06/27/2022   FREET4 1.25 02/28/2022   FREET4 1.44 08/28/2021   FREET4 1.30 09/04/2020       ASSESSMENT: 1. Hypothyroidism   2.  Type 2 diabetes  3.  Hyperlipidemia   4.  Hypertension 5.  Elevated alkaline phosphatase  PLAN:   I discussed her new and existing labs with her. -Her previsit thyroid  function tests are consistent with under replacement.  I discussed and increased her levothyroxine  to 150 mcg p.o. daily before breakfast.    - We discussed about the correct intake of her thyroid  hormone, on empty stomach at fasting, with water , separated by at least 30 minutes from breakfast and other medications,  and separated by more than 4 hours from calcium , iron , multivitamins, acid reflux medications (PPIs). -Patient is made aware of the fact that thyroid  hormone replacement is needed for life, dose to be adjusted by periodic monitoring of thyroid  function tests.     -Her  recent thyroid  ultrasound was reviewed, no goiter, no discrete nodules. -She has controlled type 2 diabetes with point-of-care A1c remaining at 5.6% overall improving.  She will not need any medication intervention at this time.    In light of her point-of-care A1c of 5.6%, she would not need medications for diabetes at this time.   She is encouraged to stay close to her oncology, gastroenterology. She is advised to maintain close follow-up with her PMD Dr. Rollene Pesa.   I spent  20  minutes in the care of the patient today including review of labs from Thyroid  Function, CMP, and other relevant labs ; imaging/biopsy records (current and previous including abstractions from other facilities); face-to-face time discussing  her lab results and symptoms, medications doses, her options of short and long term treatment based on the latest standards of care / guidelines;   and documenting the encounter.  Arland CHRISTELLA Mulch  participated in the discussions, expressed understanding, and voiced agreement with the above plans.  All questions were answered to her satisfaction. she is encouraged to contact clinic should she have any questions or concerns prior to her return visit. Dear Patient: Feel  free to review your progress notes.  If you are reviewing this progress note and have questions about the meaning of /or medical terms being used, please make a note and address it at your next follow-up appointment.  Medical notes are meant to be a communication tool between medical professionals and require medical terms to be used for efficiency and insurance approval.   Return in about 6 months (around 05/10/2025) for F/U with Pre-visit Labs, A1c -NV.  Ranny Earl, MD [facility] [address] [place], [place] [address] Phone: [phone]  Fax: [phone]   11/10/2024, 12:57 PM  This note was partially dictated with voice recognition software. Similar sounding words can be transcribed inadequately or may not  be corrected upon review. "

## 2024-11-11 ENCOUNTER — Ambulatory Visit (HOSPITAL_COMMUNITY): Payer: Medicare (Managed Care) | Admitting: Psychiatry

## 2024-11-11 ENCOUNTER — Inpatient Hospital Stay: Payer: Medicare (Managed Care) | Attending: Hematology

## 2024-11-11 DIAGNOSIS — E039 Hypothyroidism, unspecified: Secondary | ICD-10-CM

## 2024-11-11 DIAGNOSIS — C641 Malignant neoplasm of right kidney, except renal pelvis: Secondary | ICD-10-CM

## 2024-11-11 LAB — COMPREHENSIVE METABOLIC PANEL WITH GFR
ALT: 14 U/L (ref 0–44)
AST: 53 U/L — ABNORMAL HIGH (ref 15–41)
Albumin: 2.9 g/dL — ABNORMAL LOW (ref 3.5–5.0)
Alkaline Phosphatase: 237 U/L — ABNORMAL HIGH (ref 38–126)
Anion gap: 20 — ABNORMAL HIGH (ref 5–15)
BUN: 36 mg/dL — ABNORMAL HIGH (ref 8–23)
CO2: 17 mmol/L — ABNORMAL LOW (ref 22–32)
Calcium: 9.7 mg/dL (ref 8.9–10.3)
Chloride: 96 mmol/L — ABNORMAL LOW (ref 98–111)
Creatinine, Ser: 1.02 mg/dL — ABNORMAL HIGH (ref 0.44–1.00)
GFR, Estimated: >60 mL/min
Glucose, Bld: 72 mg/dL (ref 70–99)
Potassium: 4.6 mmol/L (ref 3.5–5.1)
Sodium: 134 mmol/L — ABNORMAL LOW (ref 135–145)
Total Bilirubin: 0.3 mg/dL (ref 0.0–1.2)
Total Protein: 7.6 g/dL (ref 6.5–8.1)

## 2024-11-11 LAB — CBC WITH DIFFERENTIAL/PLATELET
Abs Immature Granulocytes: 0.07 10*3/uL (ref 0.00–0.07)
Basophils Absolute: 0 10*3/uL (ref 0.0–0.1)
Basophils Relative: 0 %
Eosinophils Absolute: 0.1 10*3/uL (ref 0.0–0.5)
Eosinophils Relative: 1 %
HCT: 26.5 % — ABNORMAL LOW (ref 36.0–46.0)
Hemoglobin: 8.2 g/dL — ABNORMAL LOW (ref 12.0–15.0)
Immature Granulocytes: 2 %
Lymphocytes Relative: 26 %
Lymphs Abs: 1.1 10*3/uL (ref 0.7–4.0)
MCH: 29.7 pg (ref 26.0–34.0)
MCHC: 30.9 g/dL (ref 30.0–36.0)
MCV: 96 fL (ref 80.0–100.0)
Monocytes Absolute: 0.7 10*3/uL (ref 0.1–1.0)
Monocytes Relative: 16 %
Neutro Abs: 2.3 10*3/uL (ref 1.7–7.7)
Neutrophils Relative %: 55 %
Platelets: 254 10*3/uL (ref 150–400)
RBC: 2.76 MIL/uL — ABNORMAL LOW (ref 3.87–5.11)
RDW: 18.7 % — ABNORMAL HIGH (ref 11.5–15.5)
WBC: 4.2 10*3/uL (ref 4.0–10.5)
nRBC: 0.5 % — ABNORMAL HIGH (ref 0.0–0.2)

## 2024-11-11 LAB — SAMPLE TO BLOOD BANK

## 2024-11-11 NOTE — Progress Notes (Signed)
 Opened in Error.

## 2024-11-11 NOTE — Progress Notes (Signed)
 Patient's port flushed without difficulty.  Good blood return noted with no bruising or swelling noted at site.  Labs drawn per orders. Hgb 8.2, pt will not need blood today per protocol, pt updated. VSS with discharge and left in satisfactory condition with no s/s of distress noted. All follow ups as scheduled.       Deanna Bush

## 2024-11-18 ENCOUNTER — Inpatient Hospital Stay: Payer: Medicare (Managed Care)

## 2024-11-18 ENCOUNTER — Ambulatory Visit (HOSPITAL_COMMUNITY): Payer: Medicare (Managed Care)

## 2024-11-24 ENCOUNTER — Inpatient Hospital Stay: Payer: Medicare (Managed Care) | Admitting: Oncology

## 2024-11-24 ENCOUNTER — Inpatient Hospital Stay: Payer: Medicare (Managed Care)

## 2025-05-10 ENCOUNTER — Ambulatory Visit: Payer: Medicare (Managed Care) | Admitting: "Endocrinology
